# Patient Record
Sex: Male | Born: 1950 | Race: Black or African American | Hispanic: No | Marital: Married | Smoking: Never smoker
Health system: Southern US, Community
[De-identification: ages and names within clinical notes are randomized; demographics above are authoritative.]

## PROBLEM LIST (undated history)

## (undated) DIAGNOSIS — I739 Peripheral vascular disease, unspecified: Secondary | ICD-10-CM

## (undated) DIAGNOSIS — C8400 Mycosis fungoides, unspecified site: Secondary | ICD-10-CM

## (undated) DIAGNOSIS — F329 Major depressive disorder, single episode, unspecified: Secondary | ICD-10-CM

## (undated) DIAGNOSIS — IMO0001 Reserved for inherently not codable concepts without codable children: Secondary | ICD-10-CM

## (undated) DIAGNOSIS — M1990 Unspecified osteoarthritis, unspecified site: Secondary | ICD-10-CM

## (undated) DIAGNOSIS — I70229 Atherosclerosis of native arteries of extremities with rest pain, unspecified extremity: Secondary | ICD-10-CM

## (undated) DIAGNOSIS — R6510 Systemic inflammatory response syndrome (SIRS) of non-infectious origin without acute organ dysfunction: Secondary | ICD-10-CM

## (undated) DIAGNOSIS — N289 Disorder of kidney and ureter, unspecified: Secondary | ICD-10-CM

## (undated) DIAGNOSIS — E785 Hyperlipidemia, unspecified: Secondary | ICD-10-CM

## (undated) DIAGNOSIS — I5042 Chronic combined systolic (congestive) and diastolic (congestive) heart failure: Secondary | ICD-10-CM

## (undated) DIAGNOSIS — I10 Essential (primary) hypertension: Secondary | ICD-10-CM

## (undated) DIAGNOSIS — E119 Type 2 diabetes mellitus without complications: Secondary | ICD-10-CM

## (undated) DIAGNOSIS — F32A Depression, unspecified: Secondary | ICD-10-CM

## (undated) DIAGNOSIS — I2510 Atherosclerotic heart disease of native coronary artery without angina pectoris: Secondary | ICD-10-CM

## (undated) DIAGNOSIS — I428 Other cardiomyopathies: Secondary | ICD-10-CM

## (undated) DIAGNOSIS — G4730 Sleep apnea, unspecified: Secondary | ICD-10-CM

## (undated) DIAGNOSIS — J189 Pneumonia, unspecified organism: Secondary | ICD-10-CM

## (undated) DIAGNOSIS — I998 Other disorder of circulatory system: Secondary | ICD-10-CM

## (undated) HISTORY — DX: Essential (primary) hypertension: I10

## (undated) HISTORY — DX: Depression, unspecified: F32.A

## (undated) HISTORY — DX: Major depressive disorder, single episode, unspecified: F32.9

## (undated) HISTORY — DX: Mycosis fungoides, unspecified site: C84.00

## (undated) HISTORY — DX: Hyperlipidemia, unspecified: E78.5

## (undated) HISTORY — DX: Other disorder of circulatory system: I99.8

## (undated) HISTORY — PX: FRACTURE SURGERY: SHX138

## (undated) HISTORY — PX: COLONOSCOPY: SHX174

## (undated) HISTORY — DX: Atherosclerosis of native arteries of extremities with rest pain, unspecified extremity: I70.229

## (undated) HISTORY — DX: Chronic combined systolic (congestive) and diastolic (congestive) heart failure: I50.42

## (undated) HISTORY — DX: Other cardiomyopathies: I42.8

---

## 1999-08-14 ENCOUNTER — Ambulatory Visit: Admission: RE | Admit: 1999-08-14 | Discharge: 1999-08-14 | Payer: Self-pay | Admitting: Family Medicine

## 2001-12-19 ENCOUNTER — Inpatient Hospital Stay (HOSPITAL_COMMUNITY): Admission: EM | Admit: 2001-12-19 | Discharge: 2001-12-21 | Payer: Self-pay | Admitting: Emergency Medicine

## 2001-12-19 ENCOUNTER — Encounter: Payer: Self-pay | Admitting: Emergency Medicine

## 2003-03-12 ENCOUNTER — Encounter: Admission: RE | Admit: 2003-03-12 | Discharge: 2003-03-12 | Payer: Self-pay | Admitting: Family Medicine

## 2003-03-12 ENCOUNTER — Encounter: Payer: Self-pay | Admitting: Family Medicine

## 2003-05-24 ENCOUNTER — Encounter: Admission: RE | Admit: 2003-05-24 | Discharge: 2003-08-22 | Payer: Self-pay | Admitting: Family Medicine

## 2003-08-20 ENCOUNTER — Observation Stay (HOSPITAL_COMMUNITY): Admission: EM | Admit: 2003-08-20 | Discharge: 2003-08-21 | Payer: Self-pay | Admitting: Emergency Medicine

## 2003-11-19 ENCOUNTER — Encounter: Admission: RE | Admit: 2003-11-19 | Discharge: 2004-01-16 | Payer: Self-pay | Admitting: Neurosurgery

## 2004-01-08 ENCOUNTER — Encounter: Admission: RE | Admit: 2004-01-08 | Discharge: 2004-01-08 | Payer: Self-pay | Admitting: Family Medicine

## 2004-10-05 HISTORY — PX: ORIF FOREARM FRACTURE: SHX2124

## 2005-08-11 ENCOUNTER — Observation Stay (HOSPITAL_COMMUNITY): Admission: EM | Admit: 2005-08-11 | Discharge: 2005-08-13 | Payer: Self-pay | Admitting: Emergency Medicine

## 2010-12-27 ENCOUNTER — Emergency Department (HOSPITAL_COMMUNITY): Payer: Self-pay

## 2010-12-27 ENCOUNTER — Inpatient Hospital Stay (HOSPITAL_COMMUNITY)
Admission: EM | Admit: 2010-12-27 | Discharge: 2010-12-31 | DRG: 194 | Disposition: A | Discharge status: Home / Self Care | Payer: Self-pay | Attending: Internal Medicine | Admitting: Internal Medicine

## 2010-12-27 DIAGNOSIS — J189 Pneumonia, unspecified organism: Principal | ICD-10-CM | POA: Diagnosis present

## 2010-12-27 DIAGNOSIS — IMO0001 Reserved for inherently not codable concepts without codable children: Secondary | ICD-10-CM | POA: Diagnosis present

## 2010-12-27 DIAGNOSIS — R079 Chest pain, unspecified: Secondary | ICD-10-CM | POA: Diagnosis present

## 2010-12-27 DIAGNOSIS — I4729 Other ventricular tachycardia: Secondary | ICD-10-CM | POA: Diagnosis present

## 2010-12-27 DIAGNOSIS — I10 Essential (primary) hypertension: Secondary | ICD-10-CM | POA: Diagnosis present

## 2010-12-27 DIAGNOSIS — R0902 Hypoxemia: Secondary | ICD-10-CM | POA: Diagnosis present

## 2010-12-27 DIAGNOSIS — I472 Ventricular tachycardia, unspecified: Secondary | ICD-10-CM | POA: Diagnosis present

## 2010-12-27 DIAGNOSIS — E785 Hyperlipidemia, unspecified: Secondary | ICD-10-CM | POA: Diagnosis present

## 2010-12-27 LAB — CBC
HCT: 46.2 % (ref 39.0–52.0)
Hemoglobin: 15.5 g/dL (ref 13.0–17.0)
MCH: 28.1 pg (ref 26.0–34.0)
MCHC: 33.5 g/dL (ref 30.0–36.0)
MCV: 83.7 fL (ref 78.0–100.0)
Platelets: 244 10*3/uL (ref 150–400)
RBC: 5.52 MIL/uL (ref 4.22–5.81)
RDW: 12.2 % (ref 11.5–15.5)
WBC: 16.8 10*3/uL — ABNORMAL HIGH (ref 4.0–10.5)

## 2010-12-27 LAB — GLUCOSE, CAPILLARY: Glucose-Capillary: 357 mg/dL — ABNORMAL HIGH (ref 70–99)

## 2010-12-27 LAB — STREP PNEUMONIAE URINARY ANTIGEN: Strep Pneumo Urinary Antigen: NEGATIVE

## 2010-12-27 LAB — DIFFERENTIAL
Basophils Absolute: 0 10*3/uL (ref 0.0–0.1)
Basophils Relative: 0 % (ref 0–1)
Eosinophils Absolute: 0.1 10*3/uL (ref 0.0–0.7)
Eosinophils Relative: 1 % (ref 0–5)
Lymphocytes Relative: 16 % (ref 12–46)
Lymphs Abs: 2.7 10*3/uL (ref 0.7–4.0)
Monocytes Absolute: 1 10*3/uL (ref 0.1–1.0)
Monocytes Relative: 6 % (ref 3–12)
Neutro Abs: 12.9 10*3/uL — ABNORMAL HIGH (ref 1.7–7.7)
Neutrophils Relative %: 77 % (ref 43–77)

## 2010-12-27 LAB — COMPREHENSIVE METABOLIC PANEL
AST: 25 U/L (ref 0–37)
Albumin: 4 g/dL (ref 3.5–5.2)
Alkaline Phosphatase: 85 U/L (ref 39–117)
CO2: 28 mEq/L (ref 19–32)
Calcium: 9.1 mg/dL (ref 8.4–10.5)
Chloride: 102 mEq/L (ref 96–112)
Creatinine, Ser: 0.96 mg/dL (ref 0.4–1.5)
GFR calc Af Amer: >60 mL/min (ref >60)
GFR calc non Af Amer: >60 mL/min (ref >60)
Glucose, Bld: 296 mg/dL — ABNORMAL HIGH (ref 70–99)
Potassium: 4 mEq/L (ref 3.5–5.1)
Sodium: 137 mEq/L (ref 135–145)
Total Bilirubin: 0.9 mg/dL (ref 0.3–1.2)

## 2010-12-27 LAB — POCT CARDIAC MARKERS
Myoglobin, poc: 87.5 ng/mL (ref 12–200)
Troponin i, poc: <0.05 ng/mL (ref 0.00–0.09)

## 2010-12-27 LAB — D-DIMER, QUANTITATIVE: D-Dimer, Quant: <0.22 ug/mL-FEU (ref 0.00–0.48)

## 2010-12-28 ENCOUNTER — Inpatient Hospital Stay (HOSPITAL_COMMUNITY): Payer: Self-pay

## 2010-12-28 DIAGNOSIS — R072 Precordial pain: Secondary | ICD-10-CM

## 2010-12-28 LAB — BASIC METABOLIC PANEL
BUN: 17 mg/dL (ref 6–23)
CO2: 28 mEq/L (ref 19–32)
Calcium: 9.2 mg/dL (ref 8.4–10.5)
Chloride: 101 mEq/L (ref 96–112)
Creatinine, Ser: 0.96 mg/dL (ref 0.4–1.5)
GFR calc Af Amer: >60 mL/min (ref >60)
GFR calc non Af Amer: >60 mL/min (ref >60)
Glucose, Bld: 299 mg/dL — ABNORMAL HIGH (ref 70–99)
Potassium: 4.2 mEq/L (ref 3.5–5.1)

## 2010-12-28 LAB — DIFFERENTIAL
Basophils Absolute: 0 10*3/uL (ref 0.0–0.1)
Eosinophils Absolute: 0 10*3/uL (ref 0.0–0.7)
Eosinophils Relative: 0 % (ref 0–5)
Lymphocytes Relative: 13 % (ref 12–46)
Lymphs Abs: 2 10*3/uL (ref 0.7–4.0)
Monocytes Absolute: 0.9 10*3/uL (ref 0.1–1.0)
Monocytes Relative: 6 % (ref 3–12)
Neutro Abs: 12.2 10*3/uL — ABNORMAL HIGH (ref 1.7–7.7)

## 2010-12-28 LAB — LEGIONELLA ANTIGEN, URINE: Legionella Antigen, Urine: NEGATIVE

## 2010-12-28 LAB — GLUCOSE, CAPILLARY
Glucose-Capillary: 325 mg/dL — ABNORMAL HIGH (ref 70–99)
Glucose-Capillary: 270 mg/dL — ABNORMAL HIGH (ref 70–99)
Glucose-Capillary: 231 mg/dL — ABNORMAL HIGH (ref 70–99)
Glucose-Capillary: 229 mg/dL — ABNORMAL HIGH (ref 70–99)

## 2010-12-28 LAB — HEMOGLOBIN A1C
Hgb A1c MFr Bld: 10.8 % — ABNORMAL HIGH (ref <5.7)
Mean Plasma Glucose: 263 mg/dL — ABNORMAL HIGH (ref <117)

## 2010-12-28 LAB — CBC
HCT: 44.4 % (ref 39.0–52.0)
Hemoglobin: 14.7 g/dL (ref 13.0–17.0)
MCHC: 33.1 g/dL (ref 30.0–36.0)
MCV: 84.1 fL (ref 78.0–100.0)
Platelets: 247 10*3/uL (ref 150–400)
RBC: 5.28 MIL/uL (ref 4.22–5.81)
RDW: 12.4 % (ref 11.5–15.5)

## 2010-12-28 LAB — D-DIMER, QUANTITATIVE: D-Dimer, Quant: 0.23 ug/mL-FEU (ref 0.00–0.48)

## 2010-12-29 DIAGNOSIS — I4729 Other ventricular tachycardia: Secondary | ICD-10-CM

## 2010-12-29 DIAGNOSIS — I472 Ventricular tachycardia, unspecified: Secondary | ICD-10-CM

## 2010-12-29 DIAGNOSIS — R0789 Other chest pain: Secondary | ICD-10-CM

## 2010-12-29 DIAGNOSIS — R9389 Abnormal findings on diagnostic imaging of other specified body structures: Secondary | ICD-10-CM

## 2010-12-29 LAB — GLUCOSE, CAPILLARY
Glucose-Capillary: 254 mg/dL — ABNORMAL HIGH (ref 70–99)
Glucose-Capillary: 255 mg/dL — ABNORMAL HIGH (ref 70–99)

## 2010-12-29 LAB — DIFFERENTIAL
Basophils Relative: 0 % (ref 0–1)
Eosinophils Absolute: 0.2 10*3/uL (ref 0.0–0.7)
Eosinophils Relative: 2 % (ref 0–5)
Lymphs Abs: 3.9 10*3/uL (ref 0.7–4.0)
Monocytes Absolute: 0.8 10*3/uL (ref 0.1–1.0)
Monocytes Relative: 6 % (ref 3–12)
Neutro Abs: 7.5 10*3/uL (ref 1.7–7.7)

## 2010-12-29 LAB — CBC
Platelets: 220 10*3/uL (ref 150–400)
RBC: 5.16 MIL/uL (ref 4.22–5.81)
RDW: 12.5 % (ref 11.5–15.5)
WBC: 12.4 10*3/uL — ABNORMAL HIGH (ref 4.0–10.5)

## 2010-12-29 LAB — CARDIAC PANEL(CRET KIN+CKTOT+MB+TROPI)
CK, MB: 1.6 ng/mL (ref 0.3–4.0)
Relative Index: INVALID (ref 0.0–2.5)
Relative Index: INVALID (ref 0.0–2.5)
Total CK: 56 U/L (ref 7–232)
Troponin I: 0.01 ng/mL (ref 0.00–0.06)

## 2010-12-29 LAB — BASIC METABOLIC PANEL
Chloride: 102 mEq/L (ref 96–112)
GFR calc Af Amer: >60 mL/min (ref >60)
GFR calc non Af Amer: >60 mL/min (ref >60)
Potassium: 4 mEq/L (ref 3.5–5.1)

## 2010-12-30 DIAGNOSIS — R079 Chest pain, unspecified: Secondary | ICD-10-CM

## 2010-12-30 LAB — CARDIAC PANEL(CRET KIN+CKTOT+MB+TROPI)
CK, MB: 1.1 ng/mL (ref 0.3–4.0)
Relative Index: INVALID (ref 0.0–2.5)
Troponin I: 0.01 ng/mL (ref 0.00–0.06)

## 2010-12-30 LAB — GLUCOSE, CAPILLARY
Glucose-Capillary: 230 mg/dL — ABNORMAL HIGH (ref 70–99)
Glucose-Capillary: 110 mg/dL — ABNORMAL HIGH (ref 70–99)
Glucose-Capillary: 197 mg/dL — ABNORMAL HIGH (ref 70–99)

## 2010-12-31 LAB — LIPID PANEL
Cholesterol: 206 mg/dL — ABNORMAL HIGH (ref 0–200)
HDL: 24 mg/dL — ABNORMAL LOW (ref >39)
LDL Cholesterol: 112 mg/dL — ABNORMAL HIGH (ref 0–99)
Triglycerides: 350 mg/dL — ABNORMAL HIGH (ref <150)

## 2010-12-31 LAB — CBC
HCT: 47 % (ref 39.0–52.0)
Hemoglobin: 15.7 g/dL (ref 13.0–17.0)
MCV: 84.4 fL (ref 78.0–100.0)
RBC: 5.57 MIL/uL (ref 4.22–5.81)
RDW: 12.1 % (ref 11.5–15.5)
WBC: 8.5 10*3/uL (ref 4.0–10.5)

## 2010-12-31 LAB — DIFFERENTIAL
Basophils Absolute: 0.1 10*3/uL (ref 0.0–0.1)
Eosinophils Relative: 2 % (ref 0–5)
Lymphocytes Relative: 36 % (ref 12–46)
Lymphs Abs: 3.1 10*3/uL (ref 0.7–4.0)
Neutro Abs: 4.7 10*3/uL (ref 1.7–7.7)

## 2010-12-31 LAB — GLUCOSE, CAPILLARY: Glucose-Capillary: 207 mg/dL — ABNORMAL HIGH (ref 70–99)

## 2011-01-01 LAB — GLUCOSE, CAPILLARY: Glucose-Capillary: 386 mg/dL — ABNORMAL HIGH (ref 70–99)

## 2011-01-01 NOTE — Discharge Summary (Signed)
Eric Price, Eric Price                 ACCOUNT NO.:  [id number]  MEDICAL RECORD NO.:  [id number]           PATIENT TYPE:  I  LOCATION:  1418                         FACILITY:  [facility]  PHYSICIAN:  Peggye Pitt, M.D. DATE OF BIRTH:  01/12/1951  DATE OF ADMISSION:  12/27/2010 DATE OF DISCHARGE:  12/31/2010                              DISCHARGE SUMMARY   PRIMARY CARE PHYSICIAN:  Della Goo, MD  CARDIOLOGIST:  Rollene Rotunda, MD, [facility]  DISCHARGE DIAGNOSES: 1. Community-acquired pneumonia. 2. Chest pain, for outpatient stress test. 3. Uncontrolled diabetes mellitus. 4. Nonsustained ventricular tachycardia. 5. Hypoxemia, resolved secondary to pneumonia. 6. Hypertension. 7. Hyperlipidemia.  DISCHARGE MEDICATIONS: 1. Avelox 400 mg daily for 7 days. 2. Lantus insulin 20 units injected subcutaneously daily. 3. Simvastatin 40 mg daily. 4. Aleve 220 mg every 8 hours as needed for pain. 5. Aspirin 81 mg daily. 6. Cozaar 50 mg at bedtime. 7. Cranberry over the counter 1 tablet daily. 8. Fish oil 1000 mg daily. 9. Glyburide 5 mg daily. 10.Metformin 500 mg to take 2 tablets twice daily. 11.Triamcinolone 0.1% cream at bedtime daily for rash.  DISPOSITION AND FOLLOWUP:  Eric Price will be discharged home today in stable and improved condition.  He has been asked to schedule followup with Dr. Lovell Sheehan in about 2 weeks.  He will need to bring his CBG log, so Dr. Lovell Sheehan can further adjust his insulin as required.  The [facility] Cardiology office will call him for the appointment for an outpatient stress test.  CONSULTATION THIS HOSPITALIZATION:  Rollene Rotunda, MD, [facility] with Cardiology.  IMAGES AND PROCEDURES:  A chest x-ray on March 24th that showed right lower lobe airspace disease, suspicious for pneumonia, and secondarily focus of pneumonia maybe present in the right upper lung.  Repeat chest x-ray on March 25th that showed less prominent right lower lobe opacity.  HISTORY AND  PHYSICAL:  For complete details, please see dictation by Dr. Waymon Amato on March 24th, but in brief, Eric Price is a pleasant 60 year old African American gentleman, initially admitted to our hospital on March 24th with complaints of cough, chest pain, and shortness of breath.  He was found to be hypoxemic at 85% on room air with a pneumonia on chest x-ray, because of this we were asked to admit him for further evaluation.  HOSPITAL COURSE BY PROBLEM: 1. Community-acquired pneumonia.  He was placed on Rocephin and     azithromycin upon admission.  We have been able to titrate his     oxygen off.  He is feeling much better at this point and is no     longer having any shortness of breath or chest pain.  He will need     to take Avelox for 7 days to complete a 10-day course of     antibiotics.  He is recommended to follow up with Dr. Lovell Sheehan and I     would recommend a followup chest x-ray in 4-6 weeks to ensure     complete resolution of his pneumonia. 2. Chest pain.  He was seen in consultation by Dr. Antoine Poche with  [facility].  He has also had some nonsustained ventricular     tachycardia on telemetry.  He had a cardiac catheterization in 2003     that showed nonobstructive coronary disease.  This could have     certainly progressed since then.  Dr. Antoine Poche would like to see     him in the outpatient setting for a stress test plus/minus a Holter     monitor.  His office will call him with this appointment time. 3. Hyperlipidemia.  We have his fasting lipid panel while in the     hospital, is as follows total cholesterol 206, triglycerides 350,     HDL 24, and LDL of 112.  Because of this, his home dose of Zocor     has been increased from 20-40 mg a day.  He will need to repeat     LFTs and lipid profile in approximately 8 weeks. 4. Uncontrolled diabetes mellitus.  We have started him on Lantus, we     have had to titrate this up to 20 units.  He will continue his      glyburide and metformin.  I have asked him to take his CBGs at     least twice daily and bring these to Dr. Lovell Sheehan at his followup     appointment for further insulin titration as deemed necessary.  We     have also ordered some outpatient diabetes education. 5. Rest of chronic conditions have been stable. 6. Vitals on day of discharge, blood pressure 137/90, heart rate 96,     respirations 20, saturations are 92% on room air, and temperature     of 97.6.     Peggye Pitt, M.D.     EH/MEDQ  D:  12/31/2010  T:  12/31/2010  Job:  [id number]  cc:   Della Goo, M.D. Fax: [phone]  Rollene Rotunda, MD, [facility] [address]  [address] [place] [address]  Electronically Signed by Peggye Pitt M.D. on 01/01/2011 07:57:40 AM

## 2011-01-05 NOTE — H&P (Signed)
NAMEDIONEL, ARCHEY                 ACCOUNT NO.:  [id number]  MEDICAL RECORD NO.:  [id number]           PATIENT TYPE:  I  LOCATION:  1418                         FACILITY:  [facility]  PHYSICIAN:  Marcellus Scott, MD     DATE OF BIRTH:  20-Mar-1951  DATE OF ADMISSION:  12/27/2010 DATE OF DISCHARGE:                             HISTORY & PHYSICAL   PRIMARY CARE PHYSICIAN:  Della Goo, M.D.  CHIEF COMPLAINTS:  Nonproductive cough, chest pain, dyspnea.  HISTORY OF PRESENT ILLNESS:  Mr. Brutus is a very pleasant 60 year old African-American male patient with history of type 2 diabetes mellitus, hypertension, hyperlipidemia who was in his usual state of health until approximately 6:00 p.m. yesterday.  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  It started to rain and he took the upholstery indoors and continued cleaning.  Subsequently he started having a nonproductive cough, dyspnea, wheezing and right-sided chest pain only on coughing.  He complained of chills, but denied fevers.  These symptoms seemed to progressively get worse overnight.  He thereby presented to the emergency room where he had a low-grade temperature, leukocytosis and a chest x-ray suggestive of right lower lobe pneumonia.  He was also hypoxemic with oxygen saturation of 85% on room air and tachycardic with a heart rate of 116.  The [facility] were requested to admit him for further evaluation and management.  The patient denies any sickly contacts or long-distance travel.  He is a lifelong nonsmoker.  PAST MEDICAL HISTORY: 1. Type 2 diabetes mellitus. 2. Hypertension. 3. Hyperlipidemia. 4. Possible mild sleep apnea, but not on CPAP.  PAST SURGICAL HISTORY: 1. Open reduction and internal fixation of right forearm fracture in     2006. 2. Cardiac catheterization on December 21, 2001, which shows left     anterior descending 30% to 40% narrowing in the  midportion.     Otherwise normal left ventricular function.  ALLERGIES: 1. VIOXX 2. CODEINE.  HOME MEDICATIONS: 1. Triamcinolone 0.1% cream, one application q.h.s. 2. Cozaar 50 mg p.o. q.h.s. 3. Fish oil 1000 mg p.o. daily. 4. Aleve 220 mg tablet, three tablets p.o. q.8 hours p.r.n. for pain. 5. Cranberry over-the-counter one tablet p.o. daily. 6. Enteric-coated aspirin 81 mg p.o. daily. 7. Glyburide 5 mg p.o. daily. 8. Pravastatin 40 mg p.o. q.h.s. 9. Metformin 1000 mg p.o. b.i.d.  FAMILY HISTORY: 1. The patient's father died at age 73 from a myocardial infarction. 2. The patient's mother died at age 67 years from heart complications.     She also had diabetes.  SOCIAL HISTORY:  The patient is married and his spouse is at the bedside.  He does Building surveyor work.  He is independent of activities of daily living.  There is no history of smoking, alcohol or drug abuse.  ADVANCED DIRECTIVES:  Full Code.  REVIEW OF SYSTEMS:  All systems reviewed and apart from the history of presenting illness are negative.  The patient has an intermittent rash on the left upper back for which he uses skin cream and currently it has resolved.  PHYSICAL EXAMINATION:  GENERAL:  Mr. Schoenberger is a moderately built and nourished male patient who is in no obvious distress. VITAL SIGNS:  Temperature 98.7 degrees Fahrenheit, blood pressure 138/92 mmHg, pulse rate is 113 beats per minute and regular, respirations 20 per minute and saturating at 93% on oxygen. HEAD/EYES/ENT:  Nontraumatic, normocephalic.  Pupils equally reactive to light and accommodation.  Oral mucosa moist with no acute finding. NECK:  Supple.  No JVD or carotid bruit. LYMPHATICS:  No lymphadenopathy. RESPIRATORY SYSTEM:  With few crackles in the right base and occasional crackles in the left base.  Otherwise clear to auscultation with no increased work of breathing or rubs. CARDIOVASCULAR SYSTEM:  First and second heart sounds heard.   Regular. No murmurs or JVD. ABDOMEN:  Nondistended, nontender, soft and normal bowel sounds present. No organomegaly or mass appreciated. CENTRAL NERVOUS SYSTEM:  The patient is awake, alert and oriented x3 with no focal neurological deficits. EXTREMITIES:  With grade 5/5 power. SKIN:  Without any rashes at this time. MUSCULOSKELETAL SYSTEM:  Negative. PSYCHIATRY:  The patient is pleasant and appropriate.  LABORATORY DATA:  D-dimer negative at less than 0.22.  Comprehensive metabolic panel only significant for a glucose of 296.  Point of care cardiac markers are negative.  CBC is significant for a white blood cell of 17,000 with 77% neutrophils.  RADIOLOGIC: 1. Chest x-ray.  Impression:  Right lower lobe airspace disease     suspicious for pneumonia.  A second early focus of pneumonia may be     present in the upper right lung.  Radiographic followup to clearing     was recommended. 2. EKG shows sinus tachycardia at 108 beats per minute with left axis     deviation.  Q-waves in inferior leads.  No other acute findings.  ASSESSMENT AND PLAN: 1. Right lower lobe pneumonia.  Community-acquired pneumonia versuschemical pneumonitis.  We will admit the patient to Telemetry.     Obtain blood cultures if temperature greater than 101, urine     Legionella and streptococcal antigen.  The patient has received a     dose of Rocephin and Zithromax and Solu-Medrol in the emergency     room.  We will continue the Rocephin and the Zithromax.  We will     repeat a chest x-ray in the a.m. to evaluate the area on the right     upper lobe which is suspicious for developing pneumonia. 2. Hypoxemia secondary to right lower lobe pneumonia.  The patient has     received a dose of Solu-Medrol and currently does not seem to have     any bronchospasm.  Will provide oxygen, bronchodilator,     nebulizations and monitor. 3. Uncontrolled type 2 diabetes mellitus.  We will continue home     medications  including glyburide and metformin and place on sliding     scale insulin.  We will check hemoglobin A1c. 4. Hypertension.  Continue home medications. 5. Full code status.  Time taken in coordinating this history and physical note was 1 hour.     Marcellus Scott, MD     AH/MEDQ  D:  12/27/2010  T:  12/27/2010  Job:  [id number]  cc:   Della Goo, M.D. Fax: [phone]  Electronically Signed by Marcellus Scott MD on 01/05/2011 12:03:40 AM

## 2011-01-30 NOTE — Consult Note (Signed)
NAMEJOSS, MCDILL                 ACCOUNT NO.:  [id number]  MEDICAL RECORD NO.:  [id number]           PATIENT TYPE:  I  LOCATION:  1418                         [facility]ACILITY[facility]  [facility]  PHYSICIAN:  Rollene Rotunda, MD, FACCDATE OF BIRTH:  Feb 27, 1951  DATE OF CONSULTATION:  12/29/2010 DATE OF DISCHARGE:                                CONSULTATION   PRIMARY CARE PHYSICIAN:  Della Goo, M.D.  CARDIOLOGIST:  None.  REASON FOR CONSULTATION:  The patient with abnormal echocardiogram, chest discomfort, and nonsustained ventricular tachycardia.  HISTORY OF PRESENT ILLNESS:  The patient is a very pleasant 60 year old African American gentleman with a past history of nonobstructive coronary disease with catheterization several years ago as described below.  He does have cardiovascular risk factors.  He actually came into the hospital with some right-sided chest discomfort and abdominal discomfort and dyspnea.  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  He inhaled fumes.  He has actually been found to have a pneumonia and is being managed for this.  However, because of his risk factors and chest discomfort, he did have an echocardiogram. This demonstrated well-preserved ejection fraction.  However, there is a calcified apical finding of possible muscle bridge.  He also has been noted to have some wide-complex arrhythmias.  There has been a 3-beat run of nonsustained ventricular tachycardia as well as wide-complex possible idioventricular rhythm.  He may or may not have felt these palpitations.  He has had some vague palpitations in the past, but no presyncope or syncope.  He describes chest discomfort as difficulty breathing on his right side. This again all happened acutely.  He did not have neck or left arm discomfort.  He did not describe substernal or left-sided discomfort. There was no reported associated nausea, vomiting, or diaphoresis.  EKG did not  demonstrate acute ST-segment changes, though there is possibly an old inferior infarct with inferior Q waves.  Echocardiogram did not show regional wall motion abnormality consistent with this.  D-dimer was negative.  CK-MB, point-of-care markers negative.  The patient reports that he is relatively active in his job.  He does some walking occasionally for exercise.  He has not been able to bring on any symptoms with this.  Denies any shortness of breath, PND, or orthopnea.  PAST MEDICAL HISTORY: 1. Non-insulin-dependent diabetes mellitus, poorly controlled. 2. Hypertension. 3. Hyperlipidemia. 4. Nonobstructive coronary disease (catheterization in 2003, LAD 30%     to 40% stenosis in the midsegment).  PAST SURGICAL HISTORY:  Right upper extremity fracture.  ALLERGIES: 1. VIOXX. 2. CODEINE.  MEDICATIONS (PRIOR TO ADMISSION): 1. Triamcinolone. 2. Cozaar 50 mg nightly. 3. Fish oil. 4. Aleve. 5. Cranberry over the counter. 6. Enteric-coated aspirin. 7. Glyburide 5 mg daily. 8. Pravastatin 40 mg nightly. 9. Metformin 1000 mg b.i.d. SOCIAL HISTORY:  The patient is married.  His wife actually was my patient.  He has never smoked cigarettes.  He works Building surveyor.  FAMILY HISTORY:  Contributory for his father having myocardial infarction in his 4s and dying at age 65 of an MRI.  REVIEW OF SYSTEMS:  As  stated in the HPI and negative for all other systems.  PHYSICAL EXAMINATION:  GENERAL:  The patient is pleasant and in no distress. VITAL SIGNS:  Blood pressure 122/80, afebrile, heart rate 83 and regular, 98% saturation on 2 L. HEENT:  Eyes unremarkable.  Pupils equal, round, and reactive to light. Fundi not visualized.  Oral mucosa normal. NECK:  No jugular venous distention at 45 degrees.  Carotid upstroke brisk and symmetrical.  No bruits, thyromegaly.  LYMPHATICS:  No cervical, axillary, or inguinal adenopathy. LUNGS:  Clear to auscultation bilaterally. BACK:  No  costovertebral tenderness. CHEST:  Unremarkable. HEART:  PMI not displaced or sustained.  S1, S2 within normal.  No S3. No S4.  No clicks.  No rubs.  No murmurs.  ABDOMEN:  Morbidly obese. Positive bowel sounds.  Normal frequency and pitch.  No bruits.  No rebound.  No guarding.  No midline pulsatile mass, hepatomegaly, splenomegaly. SKIN:  No rashes.  No nodules. EXTREMITIES:  2+ pulses throughout.  No edema, cyanosis or clubbing. NEURO:  Oriented to person, place, and time.  Cranial nerves II through XII grossly intact.  Motor grossly intact.  IMAGING STUDIES:  EKG, sinus tachycardia, rate 24, left axis deviation, intervals with borderline QTc, nondiagnostic inferior Q waves.  Chest x-ray, right lower lobe pneumonia.  LABORATORY DATA:  WBC 12.4, hemoglobin 14.2, platelets 220.  Sodium 135, potassium 4.0, BUN 13, creatinine 0.67, magnesium 2.5.  D-dimer 0.23.  ASSESSMENT/PLAN: 1. Chest:  The patient's chest comfort is atypical and probably     related to his pneumonia.  However, he has significant     cardiovascular risk factors.  Outpatient stress perfusion study     should be undertaken as soon as he is improved from the standpoint  of his pneumonia.  The possibility of obstructive coronary disease     is at least moderately high indicating the need for perfusion     imaging. 2. Nonsustained ventricular tachycardia. This will be evaluated as     above, ruling out ischemia.  I will consider outpatient event     monitoring, pending the results of stress test. 3. Diabetes.  I had a long discussion about this.  He needs aggressive     medical management and lifestyle changes for this.  He will follow     with his primary provider. 4. Dyslipidemia.  I would like to see a fasting lipid profile and I     would take an aggressive stance given his previous nonobstructive     coronary disease.  I would suggest an LDL less than some 100 and     HDL greater than 40 as goals.  This to be  adjusted further if he is     found to have obstructive coronary disease. 5. Obesity.  Again, we discussed need to lose weight with diet and     exercise. 6. Calcified apical echo finding.  This is probably incidental and I     will follow up with an outpatient echocardiogram in the future to     demonstrate stability.     Rollene Rotunda, MD, [facility]     JH/MEDQ  D:  12/29/2010  T:  12/30/2010  Job:  [id number]  cc:   Della Goo, M.D. Fax: [phone]  Electronically Signed by Rollene Rotunda MD [facility] on 01/30/2011 11:44:03 AM

## 2011-10-06 DIAGNOSIS — J189 Pneumonia, unspecified organism: Secondary | ICD-10-CM

## 2011-10-06 HISTORY — DX: Pneumonia, unspecified organism: J18.9

## 2011-10-06 HISTORY — PX: KNEE SURGERY: SHX244

## 2012-07-13 ENCOUNTER — Ambulatory Visit (INDEPENDENT_AMBULATORY_CARE_PROVIDER_SITE_OTHER): Payer: BC Managed Care – PPO | Admitting: Family Medicine

## 2012-07-13 ENCOUNTER — Encounter: Payer: Self-pay | Admitting: Family Medicine

## 2012-07-13 VITALS — BP 130/88 | HR 72 | Temp 98.2°F | Resp 12 | Ht 68.75 in | Wt 223.0 lb

## 2012-07-13 DIAGNOSIS — L309 Dermatitis, unspecified: Secondary | ICD-10-CM

## 2012-07-13 DIAGNOSIS — I10 Essential (primary) hypertension: Secondary | ICD-10-CM | POA: Insufficient documentation

## 2012-07-13 DIAGNOSIS — E119 Type 2 diabetes mellitus without complications: Secondary | ICD-10-CM

## 2012-07-13 DIAGNOSIS — Z23 Encounter for immunization: Secondary | ICD-10-CM

## 2012-07-13 DIAGNOSIS — Z8659 Personal history of other mental and behavioral disorders: Secondary | ICD-10-CM

## 2012-07-13 DIAGNOSIS — E785 Hyperlipidemia, unspecified: Secondary | ICD-10-CM | POA: Insufficient documentation

## 2012-07-13 DIAGNOSIS — L259 Unspecified contact dermatitis, unspecified cause: Secondary | ICD-10-CM

## 2012-07-13 DIAGNOSIS — E1142 Type 2 diabetes mellitus with diabetic polyneuropathy: Secondary | ICD-10-CM | POA: Insufficient documentation

## 2012-07-13 LAB — LIPID PANEL
HDL: 27.8 mg/dL — ABNORMAL LOW (ref >39.00)
Triglycerides: 140 mg/dL (ref 0.0–149.0)
VLDL: 28 mg/dL (ref 0.0–40.0)

## 2012-07-13 LAB — HEPATIC FUNCTION PANEL
ALT: 21 U/L (ref 0–53)
Total Bilirubin: 0.6 mg/dL (ref 0.3–1.2)
Total Protein: 7.3 g/dL (ref 6.0–8.3)

## 2012-07-13 LAB — HEMOGLOBIN A1C: Hgb A1c MFr Bld: 7.3 % — ABNORMAL HIGH (ref 4.6–6.5)

## 2012-07-13 LAB — BASIC METABOLIC PANEL
CO2: 28 mEq/L (ref 19–32)
Calcium: 9.4 mg/dL (ref 8.4–10.5)
Glucose, Bld: 125 mg/dL — ABNORMAL HIGH (ref 70–99)
Potassium: 4.6 mEq/L (ref 3.5–5.1)
Sodium: 140 mEq/L (ref 135–145)

## 2012-07-13 LAB — MICROALBUMIN / CREATININE URINE RATIO: Microalb Creat Ratio: 0.2 mg/g (ref 0.0–30.0)

## 2012-07-13 MED ORDER — TRIAMCINOLONE ACETONIDE 0.1 % EX CREA: TOPICAL_CREAM | Freq: Two times a day (BID) | CUTANEOUS | Status: DC

## 2012-07-13 NOTE — Progress Notes (Signed)
  Subjective:    Patient ID: Eric Price, male    DOB: 1951-08-20, 61 y.o.   MRN: [id number]  HPI  New to establish care. Patient has history of type 2 diabetes diagnosed about 2003, remote history of depression, hypertension, hyperlipidemia. No prior surgeries. Medications reviewed. He states he has lost about 40 pounds this year due to his efforts with increased exercise and dietary change. Feels much better overall. Not monitoring blood sugars regularly but when checked usually low 100 range. No symptoms of hyperglycemia. No recent hypoglycemia. No recent eye exam. No flu vaccine yet. He relates previous Pneumovax last year.  He had some problems with dry skin and recurrent pruritic rash mostly trunk region. Has tried triamcinolone with slight improvement. At one point used topical antifungal without improvement.  Family history significant for father with heart disease and hypertension. Grandparents with type 2 diabetes.  Patient is married. Nonsmoker. No alcohol use. Works in Optometrist.   Review of Systems  Constitutional: Negative for appetite change, fatigue and unexpected weight change.  Eyes: Negative for visual disturbance.  Respiratory: Negative for cough, chest tightness and shortness of breath.   Cardiovascular: Negative for chest pain, palpitations and leg swelling.  Gastrointestinal: Negative for abdominal pain.  Genitourinary: Negative for dysuria.  Skin: Positive for rash.  Neurological: Negative for dizziness, syncope, weakness, light-headedness and headaches.  Psychiatric/Behavioral: Negative for dysphoric mood.       Objective:   Physical Exam  Constitutional: He appears well-developed and well-nourished.  HENT:  Right Ear: External ear normal.  Left Ear: External ear normal.  Mouth/Throat: Oropharynx is clear and moist.  Neck: Neck supple. No thyromegaly present.  Cardiovascular: Normal rate and regular rhythm.   Pulmonary/Chest: Effort normal and  breath sounds normal. No respiratory distress. He has no wheezes. He has no rales.  Musculoskeletal: He exhibits no edema.  Lymphadenopathy:    He has no cervical adenopathy.  Skin:       Patient has generally dry skin. He has some nonspecific dry scaly somewhat excoriated patches on his lower back and scattered around the trunk region.          Assessment & Plan:  #1 type 2 diabetes. Recheck A1c. Encouraged to set up eye exam. Check urine microalbumin screening #2 hypertension. Stable by today's reading. Continue weight loss efforts  #3 hyperlipidemia. Check lipid and hepatic panel #4 history of eczema. Compound triamcinolone 0.1% cream 50-50 with Eucerin cream and apply twice daily.  #5 health maintenance. Flu vaccine given. Schedule complete physical

## 2012-07-13 NOTE — Patient Instructions (Addendum)
Set up eye exam this year Consider complete physical at some point this year.

## 2012-07-15 ENCOUNTER — Other Ambulatory Visit: Payer: Self-pay | Admitting: *Deleted

## 2012-07-15 DIAGNOSIS — E785 Hyperlipidemia, unspecified: Secondary | ICD-10-CM

## 2012-07-15 MED ORDER — EZETIMIBE 10 MG PO TABS: 10.0000 mg | ORAL_TABLET | Freq: Every day | ORAL | Status: DC

## 2012-07-15 NOTE — Progress Notes (Signed)
Quick Note:  Pt informed, he will price the Zetia and let us know if too expensive. Med sent , future lab ordered ______

## 2012-08-16 ENCOUNTER — Emergency Department (HOSPITAL_COMMUNITY)
Admission: EM | Admit: 2012-08-16 | Discharge: 2012-08-16 | Disposition: A | Discharge status: Home / Self Care | Payer: BC Managed Care – PPO | Attending: Emergency Medicine | Admitting: Emergency Medicine

## 2012-08-16 ENCOUNTER — Emergency Department (HOSPITAL_COMMUNITY): Payer: BC Managed Care – PPO

## 2012-08-16 ENCOUNTER — Encounter (HOSPITAL_COMMUNITY): Payer: Self-pay | Admitting: Emergency Medicine

## 2012-08-16 DIAGNOSIS — M549 Dorsalgia, unspecified: Secondary | ICD-10-CM

## 2012-08-16 DIAGNOSIS — M791 Myalgia, unspecified site: Secondary | ICD-10-CM

## 2012-08-16 DIAGNOSIS — Z79899 Other long term (current) drug therapy: Secondary | ICD-10-CM | POA: Insufficient documentation

## 2012-08-16 DIAGNOSIS — IMO0002 Reserved for concepts with insufficient information to code with codable children: Secondary | ICD-10-CM | POA: Insufficient documentation

## 2012-08-16 DIAGNOSIS — Y929 Unspecified place or not applicable: Secondary | ICD-10-CM | POA: Insufficient documentation

## 2012-08-16 DIAGNOSIS — I10 Essential (primary) hypertension: Secondary | ICD-10-CM | POA: Insufficient documentation

## 2012-08-16 DIAGNOSIS — IMO0001 Reserved for inherently not codable concepts without codable children: Secondary | ICD-10-CM | POA: Insufficient documentation

## 2012-08-16 DIAGNOSIS — E119 Type 2 diabetes mellitus without complications: Secondary | ICD-10-CM | POA: Insufficient documentation

## 2012-08-16 DIAGNOSIS — F3289 Other specified depressive episodes: Secondary | ICD-10-CM | POA: Insufficient documentation

## 2012-08-16 DIAGNOSIS — E785 Hyperlipidemia, unspecified: Secondary | ICD-10-CM | POA: Insufficient documentation

## 2012-08-16 DIAGNOSIS — F329 Major depressive disorder, single episode, unspecified: Secondary | ICD-10-CM | POA: Insufficient documentation

## 2012-08-16 DIAGNOSIS — Y939 Activity, unspecified: Secondary | ICD-10-CM | POA: Insufficient documentation

## 2012-08-16 MED ORDER — IBUPROFEN 800 MG PO TABS
800.0000 mg | ORAL_TABLET | Freq: Once | ORAL | Status: AC
  Administered 2012-08-16: 800 mg via ORAL
  Filled 2012-08-16: qty 1

## 2012-08-16 MED ORDER — DIAZEPAM 5 MG PO TABS
5.0000 mg | ORAL_TABLET | Freq: Once | ORAL | Status: AC
  Administered 2012-08-16: 5 mg via ORAL
  Filled 2012-08-16: qty 1

## 2012-08-16 MED ORDER — NAPROXEN 375 MG PO TABS: 375.0000 mg | ORAL_TABLET | Freq: Two times a day (BID) | ORAL | Status: DC

## 2012-08-16 MED ORDER — HYDROCODONE-ACETAMINOPHEN 5-325 MG PO TABS: 1.0000 | ORAL_TABLET | Freq: Four times a day (QID) | ORAL | Status: DC | PRN
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
## 2012-08-17 ENCOUNTER — Ambulatory Visit (INDEPENDENT_AMBULATORY_CARE_PROVIDER_SITE_OTHER): Payer: BC Managed Care – PPO | Admitting: Family Medicine

## 2012-08-17 ENCOUNTER — Encounter: Payer: Self-pay | Admitting: Family Medicine

## 2012-08-17 VITALS — BP 140/88 | Temp 98.3°F | Wt 222.0 lb

## 2012-08-17 DIAGNOSIS — M79642 Pain in left hand: Secondary | ICD-10-CM

## 2012-08-17 DIAGNOSIS — M79609 Pain in unspecified limb: Secondary | ICD-10-CM

## 2012-08-17 DIAGNOSIS — M549 Dorsalgia, unspecified: Secondary | ICD-10-CM

## 2012-08-17 DIAGNOSIS — M546 Pain in thoracic spine: Secondary | ICD-10-CM

## 2012-08-17 MED ORDER — CYCLOBENZAPRINE HCL 10 MG PO TABS: 10.0000 mg | ORAL_TABLET | Freq: Three times a day (TID) | ORAL | Status: DC | PRN

## 2012-08-17 NOTE — ED Provider Notes (Signed)
Medical screening examination/treatment/procedure(s) were performed by non-physician practitioner and as supervising physician I was immediately available for consultation/collaboration.  Peyten Punches L Caoimhe Damron, MD 08/17/12 0048 
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
## 2012-08-17 NOTE — Patient Instructions (Addendum)
Follow up promptly for any  Increased headache, vomiting, confusion. Moist heat for symptom relief.

## 2012-08-30 ENCOUNTER — Telehealth: Payer: Self-pay | Admitting: Family Medicine

## 2012-08-30 DIAGNOSIS — E785 Hyperlipidemia, unspecified: Secondary | ICD-10-CM

## 2012-08-30 DIAGNOSIS — I10 Essential (primary) hypertension: Secondary | ICD-10-CM

## 2012-08-30 DIAGNOSIS — E119 Type 2 diabetes mellitus without complications: Secondary | ICD-10-CM

## 2012-08-30 MED ORDER — NAPROXEN 375 MG PO TABS: 375.0000 mg | ORAL_TABLET | Freq: Two times a day (BID) | ORAL | Status: DC

## 2012-08-30 MED ORDER — PRAVASTATIN SODIUM 40 MG PO TABS: 40.0000 mg | ORAL_TABLET | Freq: Every day | ORAL | Status: DC

## 2012-08-30 MED ORDER — LOSARTAN POTASSIUM 50 MG PO TABS: 50.0000 mg | ORAL_TABLET | Freq: Every day | ORAL | Status: DC

## 2012-08-30 MED ORDER — METFORMIN HCL 500 MG PO TABS: 1000.0000 mg | ORAL_TABLET | Freq: Two times a day (BID) | ORAL | Status: DC

## 2012-08-30 MED ORDER — GLYBURIDE 5 MG PO TABS: 5.0000 mg | ORAL_TABLET | Freq: Two times a day (BID) | ORAL | Status: DC

## 2012-08-30 NOTE — Telephone Encounter (Signed)
Pt called and is req refills for the following meds: metFORMIN (GLUCOPHAGE) 500 MG tablet,  glyBURIDE (DIABETA) 5 MG tablet,  losartan (COZAAR) 50 MG tablet , pravastatin (PRAVACHOL) 40 MG tablet,  naproxen (NAPROSYN) 375 MG tablet,  HYDROcodone-acetaminophen (NORCO/VICODIN) 5-325 MG per tablet to [facility] at [employer].

## 2012-08-30 NOTE — Telephone Encounter (Signed)
Hydrocodone last filled on 08-16-12, #15 with 0 refills.  All other meds I filled for 6 months

## 2012-08-30 NOTE — Telephone Encounter (Signed)
Refill once 

## 2012-08-31 MED ORDER — HYDROCODONE-ACETAMINOPHEN 5-325 MG PO TABS: 1.0000 | ORAL_TABLET | Freq: Four times a day (QID) | ORAL | Status: DC | PRN

## 2012-10-14 ENCOUNTER — Ambulatory Visit (INDEPENDENT_AMBULATORY_CARE_PROVIDER_SITE_OTHER): Payer: BC Managed Care – PPO | Admitting: Family Medicine

## 2012-10-14 ENCOUNTER — Encounter: Payer: Self-pay | Admitting: Family Medicine

## 2012-10-14 VITALS — BP 142/92 | Temp 98.2°F | Wt 226.0 lb

## 2012-10-14 DIAGNOSIS — E785 Hyperlipidemia, unspecified: Secondary | ICD-10-CM

## 2012-10-14 DIAGNOSIS — M545 Low back pain, unspecified: Secondary | ICD-10-CM

## 2012-10-14 DIAGNOSIS — L309 Dermatitis, unspecified: Secondary | ICD-10-CM

## 2012-10-14 DIAGNOSIS — L910 Hypertrophic scar: Secondary | ICD-10-CM

## 2012-10-14 DIAGNOSIS — I10 Essential (primary) hypertension: Secondary | ICD-10-CM

## 2012-10-14 DIAGNOSIS — L259 Unspecified contact dermatitis, unspecified cause: Secondary | ICD-10-CM

## 2012-10-14 DIAGNOSIS — E119 Type 2 diabetes mellitus without complications: Secondary | ICD-10-CM

## 2012-10-14 LAB — LIPID PANEL
HDL: 36 mg/dL — ABNORMAL LOW (ref >39.00)
Total CHOL/HDL Ratio: 6
VLDL: 19.2 mg/dL (ref 0.0–40.0)

## 2012-10-14 LAB — HEMOGLOBIN A1C: Hgb A1c MFr Bld: 8 % — ABNORMAL HIGH (ref 4.6–6.5)

## 2012-10-14 LAB — LDL CHOLESTEROL, DIRECT: Direct LDL: 145.9 mg/dL

## 2012-10-14 MED ORDER — CYCLOBENZAPRINE HCL 10 MG PO TABS: 10.0000 mg | ORAL_TABLET | Freq: Three times a day (TID) | ORAL | Status: DC | PRN

## 2012-10-14 MED ORDER — EZETIMIBE 10 MG PO TABS: 10.0000 mg | ORAL_TABLET | Freq: Every day | ORAL | Status: DC

## 2012-10-14 MED ORDER — NYSTATIN-TRIAMCINOLONE 100000-0.1 UNIT/GM-% EX CREA: 1.0000 "application " | TOPICAL_CREAM | Freq: Two times a day (BID) | CUTANEOUS | Status: DC

## 2012-10-14 MED ORDER — TRIAMCINOLONE ACETONIDE 0.1 % EX CREA: TOPICAL_CREAM | Freq: Two times a day (BID) | CUTANEOUS | Status: DC

## 2012-10-14 NOTE — Patient Instructions (Addendum)
Continue with weight loss efforts Call in 1-2 weeks if back pain no better.
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
## 2012-10-18 NOTE — Progress Notes (Signed)
Quick Note:  Pt informed and he will work on weight loss ______

## 2012-10-21 ENCOUNTER — Other Ambulatory Visit: Payer: Self-pay | Admitting: *Deleted

## 2012-10-21 DIAGNOSIS — E119 Type 2 diabetes mellitus without complications: Secondary | ICD-10-CM

## 2012-10-21 DIAGNOSIS — L309 Dermatitis, unspecified: Secondary | ICD-10-CM

## 2012-10-21 MED ORDER — NYSTATIN-TRIAMCINOLONE 100000-0.1 UNIT/GM-% EX CREA: 1.0000 "application " | TOPICAL_CREAM | Freq: Two times a day (BID) | CUTANEOUS | Status: DC

## 2012-11-15 ENCOUNTER — Telehealth: Payer: Self-pay | Admitting: Oncology

## 2012-11-15 NOTE — Telephone Encounter (Signed)
LVOM for pt to return call.  °

## 2012-11-16 ENCOUNTER — Telehealth: Payer: Self-pay | Admitting: Oncology

## 2012-11-16 NOTE — Telephone Encounter (Signed)
S/W pt in re NP appt 02/14 @ 1:30 w/Dr. Gaylyn Rong Referring Dr. Sarajane Marek schedule after MD visit.  Welcome packet at registration.

## 2012-11-16 NOTE — Telephone Encounter (Signed)
C/D 11/16/12 for appt. 11/18/12

## 2012-11-18 ENCOUNTER — Ambulatory Visit (HOSPITAL_BASED_OUTPATIENT_CLINIC_OR_DEPARTMENT_OTHER): Payer: BC Managed Care – PPO | Admitting: Lab

## 2012-11-18 ENCOUNTER — Telehealth: Payer: Self-pay | Admitting: Oncology

## 2012-11-18 ENCOUNTER — Other Ambulatory Visit: Payer: BC Managed Care – PPO | Admitting: Lab

## 2012-11-18 ENCOUNTER — Ambulatory Visit: Payer: BC Managed Care – PPO

## 2012-11-18 ENCOUNTER — Encounter: Payer: Self-pay | Admitting: Oncology

## 2012-11-18 ENCOUNTER — Ambulatory Visit (HOSPITAL_BASED_OUTPATIENT_CLINIC_OR_DEPARTMENT_OTHER): Payer: BC Managed Care – PPO | Admitting: Oncology

## 2012-11-18 VITALS — BP 132/90 | HR 119 | Temp 97.6°F | Resp 20 | Ht 69.5 in | Wt 224.1 lb

## 2012-11-18 DIAGNOSIS — C84A0 Cutaneous T-cell lymphoma, unspecified, unspecified site: Secondary | ICD-10-CM

## 2012-11-18 DIAGNOSIS — R229 Localized swelling, mass and lump, unspecified: Secondary | ICD-10-CM

## 2012-11-18 LAB — CBC WITH DIFFERENTIAL/PLATELET
BASO%: 0.5 % (ref 0.0–2.0)
EOS%: 1.7 % (ref 0.0–7.0)
MCH: 29.1 pg (ref 27.2–33.4)
MCHC: 34.9 g/dL (ref 32.0–36.0)
MCV: 83.5 fL (ref 79.3–98.0)
MONO%: 7.4 % (ref 0.0–14.0)
RBC: 5.5 10*6/uL (ref 4.20–5.82)
RDW: 12.6 % (ref 11.0–14.6)
lymph#: 2.6 10*3/uL (ref 0.9–3.3)

## 2012-11-18 LAB — MORPHOLOGY: PLT EST: ADEQUATE

## 2012-11-18 LAB — COMPREHENSIVE METABOLIC PANEL (CC13)
AST: 9 U/L (ref 5–34)
Albumin: 3.6 g/dL (ref 3.5–5.0)
Alkaline Phosphatase: 116 U/L (ref 40–150)
BUN: 15.9 mg/dL (ref 7.0–26.0)
Calcium: 10.1 mg/dL (ref 8.4–10.4)
Creatinine: 1.1 mg/dL (ref 0.7–1.3)
Glucose: 293 mg/dl — ABNORMAL HIGH (ref 70–99)

## 2012-11-18 LAB — HIV ANTIBODY (ROUTINE TESTING W REFLEX): HIV: NONREACTIVE

## 2012-11-18 NOTE — Patient Instructions (Addendum)
1.  Diagnosis:  Skin biopsy with abnormal cells;  Could not rule out cutaneous T-cell lymphoma. 2.  Recommendations:  *  CT chest/abdomen/pelvis   *  Bone marrow biopsy with goal to rule out systemic involvement of disease.   3.  Treatment recommendations:  *  If just cutaneous, then treatment by UV light by Dermatology.  *  If systemic, then chemotherapy.
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
## 2012-11-18 NOTE — Telephone Encounter (Signed)
gv and printed appt schedule for pt for Feb...gv pt barium and advised that central scheduling will call with ctand biopsy

## 2012-11-23 ENCOUNTER — Telehealth: Payer: Self-pay | Admitting: Family Medicine

## 2012-11-23 NOTE — Telephone Encounter (Signed)
Definitely agree.  He should proceed with tests as recommended to rule cutaneous type of lymphoma.

## 2012-11-23 NOTE — Telephone Encounter (Signed)
Pt informed

## 2012-11-23 NOTE — Telephone Encounter (Signed)
Pt states he is having spinal injections and he has been advised he may have skin cancer.  He was referred to dermatologist and is scheduled to have a bone marrow biopsy on Friday.  Pt wants PCP to be aware of what is going on and if he agrees with the testing he is having done with his condition.  Pt requesting call back with advise on what he should do.

## 2012-11-24 ENCOUNTER — Other Ambulatory Visit: Payer: Self-pay | Admitting: Radiology

## 2012-11-24 ENCOUNTER — Other Ambulatory Visit (HOSPITAL_COMMUNITY): Payer: BC Managed Care – PPO

## 2012-11-25 ENCOUNTER — Telehealth: Payer: Self-pay | Admitting: Oncology

## 2012-11-25 ENCOUNTER — Ambulatory Visit (HOSPITAL_COMMUNITY)
Admission: RE | Admit: 2012-11-25 | Discharge: 2012-11-25 | Disposition: A | Discharge status: Home / Self Care | Payer: BC Managed Care – PPO | Source: Ambulatory Visit | Attending: Oncology | Admitting: Oncology

## 2012-11-25 ENCOUNTER — Other Ambulatory Visit: Payer: Self-pay | Admitting: Oncology

## 2012-11-25 ENCOUNTER — Encounter (HOSPITAL_COMMUNITY): Payer: Self-pay

## 2012-11-25 DIAGNOSIS — C8409 Mycosis fungoides, extranodal and solid organ sites: Secondary | ICD-10-CM | POA: Insufficient documentation

## 2012-11-25 DIAGNOSIS — C84A0 Cutaneous T-cell lymphoma, unspecified, unspecified site: Secondary | ICD-10-CM

## 2012-11-25 DIAGNOSIS — K7689 Other specified diseases of liver: Secondary | ICD-10-CM | POA: Insufficient documentation

## 2012-11-25 DIAGNOSIS — K4090 Unilateral inguinal hernia, without obstruction or gangrene, not specified as recurrent: Secondary | ICD-10-CM | POA: Insufficient documentation

## 2012-11-25 DIAGNOSIS — Q8909 Congenital malformations of spleen: Secondary | ICD-10-CM | POA: Insufficient documentation

## 2012-11-25 MED ORDER — IOHEXOL 300 MG/ML  SOLN
100.0000 mL | Freq: Once | INTRAMUSCULAR | Status: AC | PRN
  Administered 2012-11-25: 100 mL via INTRAVENOUS

## 2012-11-25 NOTE — Telephone Encounter (Signed)
s.w .pt and advised on est appt change to march....pt ok and aware

## 2012-11-29 ENCOUNTER — Ambulatory Visit: Payer: BC Managed Care – PPO | Admitting: Oncology

## 2012-11-30 ENCOUNTER — Encounter (HOSPITAL_COMMUNITY): Payer: Self-pay

## 2012-11-30 ENCOUNTER — Ambulatory Visit (HOSPITAL_COMMUNITY)
Admission: RE | Admit: 2012-11-30 | Discharge: 2012-11-30 | Disposition: A | Discharge status: Home / Self Care | Payer: BC Managed Care – PPO | Source: Ambulatory Visit | Attending: Oncology | Admitting: Oncology

## 2012-11-30 DIAGNOSIS — I10 Essential (primary) hypertension: Secondary | ICD-10-CM | POA: Insufficient documentation

## 2012-11-30 DIAGNOSIS — Z79899 Other long term (current) drug therapy: Secondary | ICD-10-CM | POA: Insufficient documentation

## 2012-11-30 DIAGNOSIS — C84A0 Cutaneous T-cell lymphoma, unspecified, unspecified site: Secondary | ICD-10-CM

## 2012-11-30 DIAGNOSIS — R21 Rash and other nonspecific skin eruption: Secondary | ICD-10-CM | POA: Insufficient documentation

## 2012-11-30 DIAGNOSIS — L299 Pruritus, unspecified: Secondary | ICD-10-CM | POA: Insufficient documentation

## 2012-11-30 DIAGNOSIS — M549 Dorsalgia, unspecified: Secondary | ICD-10-CM | POA: Insufficient documentation

## 2012-11-30 DIAGNOSIS — E785 Hyperlipidemia, unspecified: Secondary | ICD-10-CM | POA: Insufficient documentation

## 2012-11-30 DIAGNOSIS — E119 Type 2 diabetes mellitus without complications: Secondary | ICD-10-CM | POA: Insufficient documentation

## 2012-11-30 DIAGNOSIS — R229 Localized swelling, mass and lump, unspecified: Secondary | ICD-10-CM | POA: Insufficient documentation

## 2012-11-30 LAB — PROTIME-INR: INR: 0.92 (ref 0.00–1.49)

## 2012-11-30 LAB — APTT: aPTT: 28 seconds (ref 24–37)

## 2012-11-30 LAB — CBC
HCT: 41.5 % (ref 39.0–52.0)
Hemoglobin: 14.4 g/dL (ref 13.0–17.0)
MCHC: 34.7 g/dL (ref 30.0–36.0)

## 2012-11-30 MED ORDER — MIDAZOLAM HCL 2 MG/2ML IJ SOLN
INTRAMUSCULAR | Status: AC | PRN
  Administered 2012-11-30: 2 mg via INTRAVENOUS

## 2012-11-30 MED ORDER — HYDROCODONE-ACETAMINOPHEN 5-325 MG PO TABS
1.0000 | ORAL_TABLET | Freq: Four times a day (QID) | ORAL | Status: DC | PRN
  Administered 2012-11-30: 2 via ORAL
  Filled 2012-11-30 (×2): qty 2

## 2012-11-30 MED ORDER — HYDROCODONE-ACETAMINOPHEN 5-325 MG PO TABS
1.0000 | ORAL_TABLET | Freq: Once | ORAL | Status: DC
  Filled 2012-11-30: qty 1

## 2012-11-30 MED ORDER — SODIUM CHLORIDE 0.9 % IV SOLN: INTRAVENOUS | Status: DC

## 2012-11-30 MED ORDER — ACETAMINOPHEN 500 MG PO TABS
500.0000 mg | ORAL_TABLET | Freq: Four times a day (QID) | ORAL | Status: DC | PRN
  Filled 2012-11-30: qty 1

## 2012-11-30 MED ORDER — MIDAZOLAM HCL 2 MG/2ML IJ SOLN
INTRAMUSCULAR | Status: AC
  Filled 2012-11-30: qty 6

## 2012-11-30 MED ORDER — FENTANYL CITRATE 0.05 MG/ML IJ SOLN
INTRAMUSCULAR | Status: AC | PRN
  Administered 2012-11-30: 100 ug via INTRAVENOUS

## 2012-11-30 MED ORDER — FENTANYL CITRATE 0.05 MG/ML IJ SOLN
INTRAMUSCULAR | Status: AC
  Filled 2012-11-30: qty 6

## 2012-11-30 NOTE — Procedures (Signed)
CT guided bone marrow aspirate and biopsy.  No immediate complication. 
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
## 2012-12-07 ENCOUNTER — Encounter: Payer: Self-pay | Admitting: Oncology

## 2012-12-07 ENCOUNTER — Other Ambulatory Visit: Payer: Self-pay | Admitting: Oncology

## 2012-12-07 DIAGNOSIS — C8400 Mycosis fungoides, unspecified site: Secondary | ICD-10-CM | POA: Insufficient documentation

## 2012-12-08 ENCOUNTER — Telehealth: Payer: Self-pay | Admitting: Oncology

## 2012-12-08 ENCOUNTER — Ambulatory Visit (HOSPITAL_BASED_OUTPATIENT_CLINIC_OR_DEPARTMENT_OTHER): Payer: BC Managed Care – PPO | Admitting: Oncology

## 2012-12-08 VITALS — BP 146/98 | HR 104 | Temp 96.8°F | Resp 18 | Ht 69.5 in | Wt 228.7 lb

## 2012-12-08 DIAGNOSIS — C8408 Mycosis fungoides, lymph nodes of multiple sites: Secondary | ICD-10-CM

## 2012-12-08 DIAGNOSIS — C8400 Mycosis fungoides, unspecified site: Secondary | ICD-10-CM

## 2012-12-08 DIAGNOSIS — L298 Other pruritus: Secondary | ICD-10-CM

## 2012-12-08 NOTE — Telephone Encounter (Signed)
gv and printed appt schedule to pt for May.. °

## 2012-12-08 NOTE — Patient Instructions (Addendum)
1.  Diagnosis: mycosis fungoides (MF). 2.  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  3.  Treatment options:  *  Radiation  *  UV treatment.  *  Chemotherapy afterward.  4.  I will refer you to an academic center that does either radiation or UV treatment for this disease and I will follow up with you afterward for potential chemo if needed.
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
## 2012-12-15 ENCOUNTER — Telehealth: Payer: Self-pay | Admitting: Oncology

## 2012-12-15 NOTE — Telephone Encounter (Signed)
gv referral to [facility]..she was already aware and working on it...she will call pt

## 2012-12-21 ENCOUNTER — Telehealth: Payer: Self-pay | Admitting: Oncology

## 2012-12-21 NOTE — Telephone Encounter (Signed)
Pt appt with Dr. Sharman Crate [facility] is 02/07/13@8 :30 and Appt with Dr. Oliver Hum @ [facility] is 02/13/13@9 :30. Medical records faxed. Pt is aware.

## 2013-01-19 DIAGNOSIS — F329 Major depressive disorder, single episode, unspecified: Secondary | ICD-10-CM | POA: Insufficient documentation

## 2013-01-19 DIAGNOSIS — F32A Depression, unspecified: Secondary | ICD-10-CM | POA: Insufficient documentation

## 2013-02-02 ENCOUNTER — Ambulatory Visit (HOSPITAL_BASED_OUTPATIENT_CLINIC_OR_DEPARTMENT_OTHER): Payer: BC Managed Care – PPO | Admitting: Oncology

## 2013-02-02 ENCOUNTER — Telehealth: Payer: Self-pay | Admitting: Oncology

## 2013-02-02 VITALS — BP 142/88 | HR 125 | Temp 97.1°F | Resp 18 | Ht 69.0 in | Wt 228.1 lb

## 2013-02-02 DIAGNOSIS — C8409 Mycosis fungoides, extranodal and solid organ sites: Secondary | ICD-10-CM

## 2013-02-02 DIAGNOSIS — C8400 Mycosis fungoides, unspecified site: Secondary | ICD-10-CM
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
## 2013-02-03 ENCOUNTER — Encounter: Payer: Self-pay | Admitting: Family Medicine

## 2013-02-03 ENCOUNTER — Ambulatory Visit (INDEPENDENT_AMBULATORY_CARE_PROVIDER_SITE_OTHER): Payer: BC Managed Care – PPO | Admitting: Family Medicine

## 2013-02-03 VITALS — BP 120/80 | Temp 97.9°F | Wt 232.0 lb

## 2013-02-03 DIAGNOSIS — R0609 Other forms of dyspnea: Secondary | ICD-10-CM

## 2013-02-03 DIAGNOSIS — I10 Essential (primary) hypertension: Secondary | ICD-10-CM

## 2013-02-03 DIAGNOSIS — R0989 Other specified symptoms and signs involving the circulatory and respiratory systems: Secondary | ICD-10-CM

## 2013-02-03 DIAGNOSIS — E119 Type 2 diabetes mellitus without complications: Secondary | ICD-10-CM

## 2013-02-03 DIAGNOSIS — R0600 Dyspnea, unspecified: Secondary | ICD-10-CM

## 2013-02-03 DIAGNOSIS — E785 Hyperlipidemia, unspecified: Secondary | ICD-10-CM

## 2013-02-03 LAB — HEMOGLOBIN A1C: Hgb A1c MFr Bld: 7.8 % — ABNORMAL HIGH (ref 4.6–6.5)

## 2013-02-03 NOTE — Patient Instructions (Addendum)
We will call you regarding cardiology appointment. You need to lose some weight We may be starting some insulin depending on your A1C level.
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
## 2013-02-07 ENCOUNTER — Institutional Professional Consult (permissible substitution): Payer: BC Managed Care – PPO | Admitting: Cardiology

## 2013-02-07 NOTE — Progress Notes (Signed)
Quick Note:  Pt informed ______ 

## 2013-02-13 ENCOUNTER — Ambulatory Visit: Payer: BC Managed Care – PPO | Admitting: Family Medicine

## 2013-02-20 ENCOUNTER — Encounter: Payer: Self-pay | Admitting: Family Medicine

## 2013-03-03 ENCOUNTER — Other Ambulatory Visit: Payer: BC Managed Care – PPO | Admitting: Lab

## 2013-03-03 ENCOUNTER — Ambulatory Visit: Payer: BC Managed Care – PPO | Admitting: Oncology

## 2013-04-03 ENCOUNTER — Telehealth: Payer: Self-pay | Admitting: Oncology

## 2013-04-03 ENCOUNTER — Ambulatory Visit (HOSPITAL_BASED_OUTPATIENT_CLINIC_OR_DEPARTMENT_OTHER): Payer: BC Managed Care – PPO | Admitting: Oncology

## 2013-04-03 ENCOUNTER — Other Ambulatory Visit (HOSPITAL_BASED_OUTPATIENT_CLINIC_OR_DEPARTMENT_OTHER): Payer: BC Managed Care – PPO | Admitting: Lab

## 2013-04-03 VITALS — BP 148/92 | HR 102 | Temp 98.2°F | Resp 18 | Ht 69.0 in | Wt 222.9 lb

## 2013-04-03 DIAGNOSIS — C8409 Mycosis fungoides, extranodal and solid organ sites: Secondary | ICD-10-CM

## 2013-04-03 DIAGNOSIS — C8400 Mycosis fungoides, unspecified site: Secondary | ICD-10-CM

## 2013-04-03 LAB — COMPREHENSIVE METABOLIC PANEL (CC13)
Albumin: 3.7 g/dL (ref 3.5–5.0)
BUN: 6.7 mg/dL — ABNORMAL LOW (ref 7.0–26.0)
CO2: 25 mEq/L (ref 22–29)
Calcium: 9.4 mg/dL (ref 8.4–10.4)
Chloride: 103 mEq/L (ref 98–109)
Glucose: 238 mg/dl — ABNORMAL HIGH (ref 70–140)
Potassium: 3.8 mEq/L (ref 3.5–5.1)

## 2013-04-03 LAB — CBC WITH DIFFERENTIAL/PLATELET
Basophils Absolute: 0 10*3/uL (ref 0.0–0.1)
Eosinophils Absolute: 0.2 10*3/uL (ref 0.0–0.5)
HGB: 14.7 g/dL (ref 13.0–17.1)
MCV: 81.6 fL (ref 79.3–98.0)
MONO%: 9.8 % (ref 0.0–14.0)
NEUT#: 4.4 10*3/uL (ref 1.5–6.5)
RDW: 12.1 % (ref 11.0–14.6)
lymph#: 0.9 10*3/uL (ref 0.9–3.3)
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
## 2013-04-03 NOTE — Telephone Encounter (Signed)
, °

## 2013-04-04 ENCOUNTER — Institutional Professional Consult (permissible substitution): Payer: BC Managed Care – PPO | Admitting: Cardiology

## 2013-04-12 ENCOUNTER — Telehealth: Payer: Self-pay | Admitting: *Deleted

## 2013-04-12 MED ORDER — HYDROCODONE-ACETAMINOPHEN 5-325 MG PO TABS: 1.0000 | ORAL_TABLET | Freq: Four times a day (QID) | ORAL | Status: DC | PRN
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
## 2013-04-12 NOTE — Telephone Encounter (Signed)
Informed pt of Dr. Lodema Pilot message below.  He verbalized understanding and will continue to try to reach [facility] for further orders.  Asked if he has ever taken hydrocodone in the past,  If he is allergic, since he has morphine allergy.  Pt states has taken hydrocodone w/o any adverse reaction.  Informed will call in Rx to his pharmacy,  [facility].  He verbalized understanding.

## 2013-04-12 NOTE — Telephone Encounter (Signed)
They have to call [facility] for the cream since it may interfere with the radiation.  Please call in Lortab 5/325, one tab every 6 hours prn #90 and no refill.  This Lortab order is one time thing since [facility] treated him.

## 2013-04-13 ENCOUNTER — Encounter: Payer: Self-pay | Admitting: Family Medicine

## 2013-04-13 NOTE — Progress Notes (Signed)
Received call yesterday from [facility] from Dr. Delford Field.Patient has mycosis fungoides and was having significant skin pain. They requested that we right prescription for oxycodone so that patient did not have to travel all the way to [facility]. However, I called patient this morning he states his pain is dramatically improved since he has spoken with them. He wishes to hold off on any pain medications at this time.

## 2013-04-13 NOTE — Progress Notes (Deleted)
  Subjective:    Patient ID: Eric Price, male    DOB: 1950-10-31, 62 y.o.   MRN: [id number]  HPI    Review of Systems     Objective:   Physical Exam        Assessment & Plan:

## 2013-05-11 ENCOUNTER — Telehealth: Payer: Self-pay | Admitting: Hematology and Oncology

## 2013-05-11 NOTE — Telephone Encounter (Signed)
S/W PT IN RE TO TIME CHANGE TO 10 LAB/10:30MD PT CONFIRM TIME.

## 2013-05-15 ENCOUNTER — Encounter: Payer: Self-pay | Admitting: Family Medicine

## 2013-05-15 ENCOUNTER — Ambulatory Visit (INDEPENDENT_AMBULATORY_CARE_PROVIDER_SITE_OTHER): Payer: Self-pay | Admitting: Family Medicine

## 2013-05-15 VITALS — BP 132/82 | HR 99 | Temp 98.7°F | Wt 226.0 lb

## 2013-05-15 DIAGNOSIS — C8400 Mycosis fungoides, unspecified site: Secondary | ICD-10-CM

## 2013-05-15 DIAGNOSIS — E119 Type 2 diabetes mellitus without complications: Secondary | ICD-10-CM

## 2013-05-15 DIAGNOSIS — C8409 Mycosis fungoides, extranodal and solid organ sites: Secondary | ICD-10-CM

## 2013-05-15 DIAGNOSIS — I10 Essential (primary) hypertension: Secondary | ICD-10-CM

## 2013-05-15 DIAGNOSIS — E785 Hyperlipidemia, unspecified: Secondary | ICD-10-CM

## 2013-05-15 LAB — HM DIABETES EYE EXAM

## 2013-05-15 LAB — HEMOGLOBIN A1C: Hgb A1c MFr Bld: 7.1 % — ABNORMAL HIGH (ref 4.6–6.5)

## 2013-05-15 LAB — HM DIABETES FOOT EXAM: HM Diabetic Foot Exam: NORMAL

## 2013-05-15 NOTE — Patient Instructions (Addendum)
Continue weight loss efforts.
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
## 2013-05-17 ENCOUNTER — Telehealth: Payer: Self-pay | Admitting: Oncology

## 2013-05-17 NOTE — Telephone Encounter (Signed)
pt called to r/s appt due to not beeing done with tx at [facility] needed to r/s 2weeks after [facility] app on 8.19.14.Marland KitchenMarland KitchenMarland KitchenDone

## 2013-05-18 ENCOUNTER — Ambulatory Visit: Payer: BC Managed Care – PPO

## 2013-05-18 ENCOUNTER — Other Ambulatory Visit: Payer: BC Managed Care – PPO | Admitting: Lab

## 2013-06-07 ENCOUNTER — Telehealth: Payer: Self-pay | Admitting: Hematology and Oncology

## 2013-06-07 ENCOUNTER — Other Ambulatory Visit: Payer: BC Managed Care – PPO | Admitting: Lab

## 2013-06-07 ENCOUNTER — Institutional Professional Consult (permissible substitution): Payer: BC Managed Care – PPO | Admitting: Cardiology

## 2013-06-07 ENCOUNTER — Ambulatory Visit: Payer: BC Managed Care – PPO

## 2013-06-07 NOTE — Telephone Encounter (Signed)
pt called to r/s appt due to having appt at [facility].Marland KitchenMarland KitchenMarland KitchenDone

## 2013-06-08 ENCOUNTER — Other Ambulatory Visit: Payer: Self-pay | Admitting: Family Medicine

## 2013-06-09 ENCOUNTER — Other Ambulatory Visit (HOSPITAL_BASED_OUTPATIENT_CLINIC_OR_DEPARTMENT_OTHER): Payer: BC Managed Care – PPO

## 2013-06-09 ENCOUNTER — Ambulatory Visit (HOSPITAL_BASED_OUTPATIENT_CLINIC_OR_DEPARTMENT_OTHER): Payer: BC Managed Care – PPO | Admitting: Hematology and Oncology

## 2013-06-09 ENCOUNTER — Telehealth: Payer: Self-pay | Admitting: Hematology and Oncology

## 2013-06-09 VITALS — BP 141/87 | HR 91 | Temp 97.9°F | Resp 20 | Ht 69.0 in | Wt 233.3 lb

## 2013-06-09 DIAGNOSIS — C8409 Mycosis fungoides, extranodal and solid organ sites: Secondary | ICD-10-CM

## 2013-06-09 DIAGNOSIS — C8400 Mycosis fungoides, unspecified site: Secondary | ICD-10-CM

## 2013-06-09 LAB — COMPREHENSIVE METABOLIC PANEL (CC13)
Alkaline Phosphatase: 76 U/L (ref 40–150)
BUN: 13.5 mg/dL (ref 7.0–26.0)
Creatinine: 0.8 mg/dL (ref 0.7–1.3)
Glucose: 231 mg/dl — ABNORMAL HIGH (ref 70–140)
Total Bilirubin: 0.33 mg/dL (ref 0.20–1.20)

## 2013-06-09 LAB — CBC WITH DIFFERENTIAL/PLATELET
Basophils Absolute: 0 10*3/uL (ref 0.0–0.1)
Eosinophils Absolute: 0.1 10*3/uL (ref 0.0–0.5)
HGB: 13.7 g/dL (ref 13.0–17.1)
LYMPH%: 30.7 % (ref 14.0–49.0)
MCH: 28.1 pg (ref 27.2–33.4)
MCV: 82.3 fL (ref 79.3–98.0)
MONO%: 8.7 % (ref 0.0–14.0)
NEUT#: 3 10*3/uL (ref 1.5–6.5)
Platelets: 272 10*3/uL (ref 140–400)

## 2013-06-09 NOTE — Telephone Encounter (Signed)
s.w. pt and advised on 11.6.14 appt...pt ok and aware
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
## 2013-06-30 ENCOUNTER — Institutional Professional Consult (permissible substitution): Payer: BC Managed Care – PPO | Admitting: Cardiology

## 2013-06-30 ENCOUNTER — Ambulatory Visit (INDEPENDENT_AMBULATORY_CARE_PROVIDER_SITE_OTHER): Payer: BC Managed Care – PPO | Admitting: Cardiology

## 2013-06-30 ENCOUNTER — Encounter: Payer: Self-pay | Admitting: Cardiology

## 2013-06-30 VITALS — BP 136/88 | HR 93 | Ht 69.0 in | Wt 233.0 lb

## 2013-06-30 DIAGNOSIS — R0600 Dyspnea, unspecified: Secondary | ICD-10-CM | POA: Insufficient documentation

## 2013-06-30 DIAGNOSIS — E785 Hyperlipidemia, unspecified: Secondary | ICD-10-CM

## 2013-06-30 DIAGNOSIS — R0609 Other forms of dyspnea: Secondary | ICD-10-CM

## 2013-06-30 DIAGNOSIS — Z79899 Other long term (current) drug therapy: Secondary | ICD-10-CM

## 2013-06-30 DIAGNOSIS — R0989 Other specified symptoms and signs involving the circulatory and respiratory systems: Secondary | ICD-10-CM

## 2013-06-30 MED ORDER — ATORVASTATIN CALCIUM 40 MG PO TABS: 40.0000 mg | ORAL_TABLET | Freq: Every day | ORAL | Status: DC
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
## 2013-06-30 NOTE — Patient Instructions (Addendum)
Please start Lipitor (Atorvastatin) 40 mg a day.  Stop Pravastatin. Continue all other medications as listed.  Return fasting in 8 weeks for lab work. (lipid and liver)  Your physician has requested that you have a lexiscan myoview. For further information please visit [email or web address] Please follow instruction sheet, as given.  Further follow up will be based on these results.

## 2013-07-14 ENCOUNTER — Institutional Professional Consult (permissible substitution): Payer: BC Managed Care – PPO | Admitting: Cardiology

## 2013-07-31 ENCOUNTER — Other Ambulatory Visit: Payer: Self-pay | Admitting: Family Medicine

## 2013-08-02 ENCOUNTER — Telehealth: Payer: Self-pay | Admitting: Hematology and Oncology

## 2013-08-02 NOTE — Telephone Encounter (Signed)
pt aware of appt on 11/6 shh

## 2013-08-02 NOTE — Telephone Encounter (Signed)
sw pt aware of 11/6 appt shh

## 2013-08-02 NOTE — Telephone Encounter (Signed)
sw pt made aware of 11/06 appt shh

## 2013-08-10 ENCOUNTER — Ambulatory Visit (HOSPITAL_BASED_OUTPATIENT_CLINIC_OR_DEPARTMENT_OTHER): Payer: BC Managed Care – PPO | Admitting: Hematology and Oncology

## 2013-08-10 ENCOUNTER — Other Ambulatory Visit: Payer: Self-pay

## 2013-08-10 ENCOUNTER — Encounter: Payer: Self-pay | Admitting: Hematology and Oncology

## 2013-08-10 ENCOUNTER — Telehealth: Payer: Self-pay | Admitting: Hematology and Oncology

## 2013-08-10 ENCOUNTER — Ambulatory Visit (HOSPITAL_BASED_OUTPATIENT_CLINIC_OR_DEPARTMENT_OTHER): Payer: BC Managed Care – PPO | Admitting: Lab

## 2013-08-10 VITALS — BP 159/98 | HR 121 | Temp 97.0°F | Resp 18 | Ht 69.0 in | Wt 221.8 lb

## 2013-08-10 DIAGNOSIS — C8409 Mycosis fungoides, extranodal and solid organ sites: Secondary | ICD-10-CM

## 2013-08-10 DIAGNOSIS — C8400 Mycosis fungoides, unspecified site: Secondary | ICD-10-CM

## 2013-08-10 LAB — COMPREHENSIVE METABOLIC PANEL (CC13)
ALT: 29 U/L (ref 0–55)
AST: 17 U/L (ref 5–34)
Albumin: 4.1 g/dL (ref 3.5–5.0)
Alkaline Phosphatase: 136 U/L (ref 40–150)
Anion Gap: 12 mEq/L — ABNORMAL HIGH (ref 3–11)
BUN: 11.3 mg/dL (ref 7.0–26.0)
CO2: 20 mEq/L — ABNORMAL LOW (ref 22–29)
Calcium: 9.4 mg/dL (ref 8.4–10.4)
Chloride: 104 mEq/L (ref 98–109)
Creatinine: 1 mg/dL (ref 0.7–1.3)
Glucose: 255 mg/dl — ABNORMAL HIGH (ref 70–140)
Potassium: 4.1 mEq/L (ref 3.5–5.1)
Sodium: 136 mEq/L (ref 136–145)
Total Bilirubin: 0.49 mg/dL (ref 0.20–1.20)
Total Protein: 7.4 g/dL (ref 6.4–8.3)

## 2013-08-10 LAB — CBC WITH DIFFERENTIAL/PLATELET
BASO%: 0.5 % (ref 0.0–2.0)
Basophils Absolute: 0 10*3/uL (ref 0.0–0.1)
EOS%: 0.7 % (ref 0.0–7.0)
Eosinophils Absolute: 0.1 10*3/uL (ref 0.0–0.5)
HCT: 46.7 % (ref 38.4–49.9)
HGB: 15.4 g/dL (ref 13.0–17.1)
LYMPH%: 23.1 % (ref 14.0–49.0)
MCH: 27 pg — ABNORMAL LOW (ref 27.2–33.4)
MCHC: 33 g/dL (ref 32.0–36.0)
MCV: 82 fL (ref 79.3–98.0)
MONO#: 0.6 10*3/uL (ref 0.1–0.9)
MONO%: 6.6 % (ref 0.0–14.0)
NEUT#: 6.2 10*3/uL (ref 1.5–6.5)
NEUT%: 69.1 % (ref 39.0–75.0)
Platelets: 252 10*3/uL (ref 140–400)
RBC: 5.7 10*6/uL (ref 4.20–5.82)
RDW: 12.6 % (ref 11.0–14.6)
WBC: 9 10*3/uL (ref 4.0–10.3)
lymph#: 2.1 10*3/uL (ref 0.9–3.3)

## 2013-08-10 LAB — LACTATE DEHYDROGENASE (CC13): LDH: 163 U/L (ref 125–245)

## 2013-08-10 NOTE — Progress Notes (Signed)
[facility] OFFICE PROGRESS NOTE  Patient Care Team: Kristian Covey, MD as PCP - General (Family Medicine) Claudie Revering, MD as Consulting Physician (Dermatology) Exie Parody, MD (Hematology and Oncology) Delrae Sawyers as Referring Physician (Orthopedic Surgery) Theodora Blow as Physician Assistant (Hematology and Oncology) Provider Not In System as Registered Nurse  DIAGNOSIS: T3, N0, M0, B0 mycosis fungoides, stage IIB  SUMMARY OF ONCOLOGIC HISTORY: This is a patient was diagnosed with mycosis fungoides early this year. He has noticed enlarging skin nodules for over 8 years, associated with itching, not relieved by topical steroid cream. He had complete staging including bone marrow aspirate and biopsy early this year. He was placed on stage II disease and was referred to [facility] for further evaluation. He received whole body irradiation with Dr. Carley Hammed, [facility] x 7 weeks  02/2013.-04/2013. After radiation is completed, he was placed on adjuvant interferon therapy. He has been on interferon for over 4 weeks now.  INTERVAL HISTORY: Eric Price 62 y.o. male returns for further followup. He complained of persistent dry skin and dry mouth. He has significant itching on his skin recently and whenever he scratches skin he noticed some redness appearing. He also complained of very dry skin. He denies any lymphadenopathy. He denies any recent fever, chills, night sweats or abnormal weight loss  I have reviewed the past medical history, past surgical history, social history and family history with the patient and they are unchanged from previous note.  ALLERGIES:  is allergic to morphine and morphine and related.  MEDICATIONS:  Current Outpatient Prescriptions  Medication Sig Dispense Refill  . atorvastatin (LIPITOR) 40 MG tablet Take 1 tablet (40 mg total) by mouth daily.  30 tablet  11  . cyclobenzaprine (FLEXERIL) 10 MG tablet Take 1 tablet (10 mg total) by  mouth 3 (three) times daily as needed for muscle spasms.  30 tablet  1  . ezetimibe (ZETIA) 10 MG tablet Take 1 tablet (10 mg total) by mouth daily.  90 tablet  3  . gabapentin (NEURONTIN) 300 MG capsule Take 300 mg by mouth daily. Per Delrae Sawyers, MD orthopedics      . glyBURIDE (DIABETA) 5 MG tablet Take 1 tablet (5 mg total) by mouth 2 (two) times daily with a meal.  60 tablet  5  . hydrOXYzine (ATARAX/VISTARIL) 10 MG tablet Take 10 mg by mouth 3 (three) times daily as needed.      . interferon alfa-2b (INTRON-A) 6000000 UNIT/ML injection Inject 0.5 cc (3 Mu) three times a week subcutaneously      . losartan (COZAAR) 50 MG tablet Take 1 tablet (50 mg total) by mouth daily.  30 tablet  5  . metFORMIN (GLUCOPHAGE) 500 MG tablet Take 2 tablets (1,000 mg total) by mouth 2 (two) times daily with a meal.  120 tablet  5  . mupirocin cream (BACTROBAN) 2 % Apply 1 application topically 3 (three) times daily.      Marland Kitchen triamcinolone cream (KENALOG) 0.1 % APPLY TOPICALLY TWO TIMES DAILY TO AFFECTED AREAS  454 g  1   No current facility-administered medications for this visit.    REVIEW OF SYSTEMS:   Constitutional: Denies fevers, chills or abnormal weight loss Eyes: Denies blurriness of vision Ears, nose, mouth, throat, and face: Denies mucositis or sore throat Respiratory: Denies cough, dyspnea or wheezes Cardiovascular: Denies palpitation, chest discomfort or lower extremity swelling Gastrointestinal:  Denies nausea, heartburn or change in bowel habits Lymphatics: Denies  new lymphadenopathy or easy bruising Neurological:Denies numbness, tingling or new weaknesses Behavioral/Psych: Mood is stable, no new changes  All other systems were reviewed with the patient and are negative.  PHYSICAL EXAMINATION: ECOG PERFORMANCE STATUS: 1 - Symptomatic but completely ambulatory  Filed Vitals:   08/10/13 1412  BP: 159/98  Pulse: 121  Temp: 97 F (36.1 C)  Resp: 18   Filed Weights   08/10/13 1412   Weight: 221 lb 12.8 oz (100.608 kg)    GENERAL:alert, no distress and comfortable. He is morbidly obese SKIN: skin color, texture, turgor are normal, no rashes or significant lesions. Noted previously healed scarring from prior mycosis fungoides EYES: normal, Conjunctiva are pink and non-injected, sclera clear OROPHARYNX:no exudate, no erythema and lips, buccal mucosa, and tongue normal  NECK: supple, thyroid normal size, non-tender, without nodularity LYMPH:  no palpable lymphadenopathy in the cervical, axillary or inguinal LUNGS: clear to auscultation and percussion with normal breathing effort HEART: regular rate & rhythm and no murmurs and no lower extremity edema ABDOMEN:abdomen soft, non-tender and normal bowel sounds Musculoskeletal:no cyanosis of digits and no clubbing  NEURO: alert & oriented x 3 with fluent speech, no focal motor/sensory deficits  LABORATORY DATA:  I have reviewed the data as listed    Component Value Date/Time   NA 137 06/09/2013 0950   NA 140 07/13/2012 1129   K 3.7 06/09/2013 0950   K 4.6 07/13/2012 1129   CL 103 11/18/2012 1458   CL 104 07/13/2012 1129   CO2 23 06/09/2013 0950   CO2 28 07/13/2012 1129   GLUCOSE 231* 06/09/2013 0950   GLUCOSE 293* 11/18/2012 1458   GLUCOSE 125* 07/13/2012 1129   BUN 13.5 06/09/2013 0950   BUN 14 07/13/2012 1129   CREATININE 0.8 06/09/2013 0950   CREATININE 1.1 07/13/2012 1129   CALCIUM 9.0 06/09/2013 0950   CALCIUM 9.4 07/13/2012 1129   PROT 6.6 06/09/2013 0950   PROT 7.3 07/13/2012 1129   ALBUMIN 3.6 06/09/2013 0950   ALBUMIN 4.1 07/13/2012 1129   AST 17 06/09/2013 0950   AST 15 07/13/2012 1129   ALT 26 06/09/2013 0950   ALT 21 07/13/2012 1129   ALKPHOS 76 06/09/2013 0950   ALKPHOS 95 07/13/2012 1129   BILITOT 0.33 06/09/2013 0950   BILITOT 0.6 07/13/2012 1129   GFRNONAA >60 12/29/2010 0524   GFRAA  Value: >60        The eGFR has been calculated using the MDRD equation. This calculation has not been validated in all clinical situations. eGFR's  persistently <60 mL/min signify possible Chronic Kidney Disease. 12/29/2010 0524    No results found for this basename: SPEP, UPEP,  kappa and lambda light chains    Lab Results  Component Value Date   WBC 5.3 06/09/2013   NEUTROABS 3.0 06/09/2013   HGB 13.7 06/09/2013   HCT 40.2 06/09/2013   MCV 82.3 06/09/2013   PLT 272 06/09/2013      Chemistry      Component Value Date/Time   NA 137 06/09/2013 0950   NA 140 07/13/2012 1129   K 3.7 06/09/2013 0950   K 4.6 07/13/2012 1129   CL 103 11/18/2012 1458   CL 104 07/13/2012 1129   CO2 23 06/09/2013 0950   CO2 28 07/13/2012 1129   BUN 13.5 06/09/2013 0950   BUN 14 07/13/2012 1129   CREATININE 0.8 06/09/2013 0950   CREATININE 1.1 07/13/2012 1129      Component Value Date/Time   CALCIUM 9.0 06/09/2013  0950   CALCIUM 9.4 07/13/2012 1129   ALKPHOS 76 06/09/2013 0950   ALKPHOS 95 07/13/2012 1129   AST 17 06/09/2013 0950   AST 15 07/13/2012 1129   ALT 26 06/09/2013 0950   ALT 21 07/13/2012 1129   BILITOT 0.33 06/09/2013 0950   BILITOT 0.6 07/13/2012 1129     ASSESSMENT:  #1 mycosis fungoides #2 dry skin  PLAN:  #1 mycosis fungoides I would defer to his physicians at [facility] for further treatment. In my opinion, he is no evidence of active disease. I reassured the patient. I will go ahead and order a CBC with differential CMP and LDH per patient request. #2 dry skin I suspect his itchiness is because of significant dryness on his skin. I recommend a malignant cream once or twice a day to prevent dry skin which typically can exacerbate sensation of skin itchiness Have not make a return appointment for the patient to come back as he is getting active care through [facility]. I gave him my business card and encouraged him to call me back if he needs to be seen back in the office.  Orders Placed This Encounter  Procedures  . CBC with Differential    Standing Status: Future     Number of Occurrences: 1     Standing Expiration Date: 05/02/2014  .  Comprehensive metabolic panel    Standing Status: Future     Number of Occurrences: 1     Standing Expiration Date: 08/10/2014  . Lactate dehydrogenase    Standing Status: Future     Number of Occurrences: 1     Standing Expiration Date: 08/10/2014   All questions were answered. The patient knows to call the clinic with any problems, questions or concerns. No barriers to learning was detected. I spent 25 minutes counseling the patient face to face. The total time spent in the appointment was 40 minutes and more than 50% was on counseling and review of test results     [facility], Zannie Runkle, MD 08/10/2013 2:51 PM

## 2013-08-10 NOTE — Telephone Encounter (Signed)
GV AND PRINTED AVS FOR PT AND SENT PT TO LAB

## 2013-08-17 ENCOUNTER — Encounter (HOSPITAL_COMMUNITY): Payer: BC Managed Care – PPO

## 2013-08-17 ENCOUNTER — Other Ambulatory Visit: Payer: BC Managed Care – PPO

## 2013-09-04 ENCOUNTER — Other Ambulatory Visit: Payer: Self-pay | Admitting: Family Medicine

## 2013-09-05 ENCOUNTER — Other Ambulatory Visit (INDEPENDENT_AMBULATORY_CARE_PROVIDER_SITE_OTHER): Payer: BC Managed Care – PPO

## 2013-09-05 ENCOUNTER — Encounter: Payer: Self-pay | Admitting: Cardiology

## 2013-09-05 ENCOUNTER — Ambulatory Visit (HOSPITAL_COMMUNITY): Payer: BC Managed Care – PPO | Attending: Cardiology | Admitting: Radiology

## 2013-09-05 VITALS — BP 136/95 | HR 88 | Ht 69.0 in | Wt 221.0 lb

## 2013-09-05 DIAGNOSIS — R0602 Shortness of breath: Secondary | ICD-10-CM

## 2013-09-05 DIAGNOSIS — I2510 Atherosclerotic heart disease of native coronary artery without angina pectoris: Secondary | ICD-10-CM | POA: Insufficient documentation

## 2013-09-05 DIAGNOSIS — I10 Essential (primary) hypertension: Secondary | ICD-10-CM | POA: Insufficient documentation

## 2013-09-05 DIAGNOSIS — Z8249 Family history of ischemic heart disease and other diseases of the circulatory system: Secondary | ICD-10-CM | POA: Insufficient documentation

## 2013-09-05 DIAGNOSIS — E785 Hyperlipidemia, unspecified: Secondary | ICD-10-CM

## 2013-09-05 DIAGNOSIS — E119 Type 2 diabetes mellitus without complications: Secondary | ICD-10-CM | POA: Insufficient documentation

## 2013-09-05 DIAGNOSIS — Z01810 Encounter for preprocedural cardiovascular examination: Secondary | ICD-10-CM | POA: Insufficient documentation

## 2013-09-05 DIAGNOSIS — Z79899 Other long term (current) drug therapy: Secondary | ICD-10-CM

## 2013-09-05 DIAGNOSIS — R0600 Dyspnea, unspecified: Secondary | ICD-10-CM

## 2013-09-05 LAB — LIPID PANEL
Cholesterol: 158 mg/dL (ref 0–200)
HDL: 34.5 mg/dL — ABNORMAL LOW (ref >39.00)
Total CHOL/HDL Ratio: 5
VLDL: 30.2 mg/dL (ref 0.0–40.0)

## 2013-09-05 LAB — HEPATIC FUNCTION PANEL
ALT: 38 U/L (ref 0–53)
Albumin: 4.1 g/dL (ref 3.5–5.2)
Total Bilirubin: 0.6 mg/dL (ref 0.3–1.2)

## 2013-09-05 MED ORDER — REGADENOSON 0.4 MG/5ML IV SOLN
0.4000 mg | Freq: Once | INTRAVENOUS | Status: AC
  Administered 2013-09-05: 0.4 mg via INTRAVENOUS

## 2013-09-05 MED ORDER — TECHNETIUM TC 99M SESTAMIBI GENERIC - CARDIOLITE
33.0000 | Freq: Once | INTRAVENOUS | Status: AC | PRN
  Administered 2013-09-05: 33 via INTRAVENOUS

## 2013-09-05 MED ORDER — TECHNETIUM TC 99M SESTAMIBI GENERIC - CARDIOLITE
11.0000 | Freq: Once | INTRAVENOUS | Status: AC | PRN
  Administered 2013-09-05: 11 via INTRAVENOUS

## 2013-09-05 NOTE — Progress Notes (Signed)
[facility] SITE 3 NUCLEAR MED [address] [place], [place] [address] [phone]    Cardiology Nuclear Med Study  Eric Price is a 62 y.o. male     MRN : [id number]     DOB: 01-20-1951  Procedure Date: 09/05/2013  Nuclear Med Background Indication for Stress Test:  Evaluation for Ischemia, Pending Surgical Clearance on (L) Shoulder Surgery by Dr. Madilyn Fireman [facility], ) History:  CAD, Cath 2004 (mild n/o disease), MPI 2003 (midapical) EF 47% Cardiac Risk Factors: Family History - CAD, Hypertension, Lipids and NIDDM  Symptoms:  SOB   Nuclear Pre-Procedure Caffeine/Decaff Intake:  None > 12 hrs NPO After: 9:00pm   Lungs:  clear O2 Sat: 95% on room air. IV 0.9% NS with Angio Cath:  20g  IV Site: R Antecubital x 1, tolerated well IV Started by:  Irean Hong, RN  Chest Size (in):  48 Cup Size: n/a  Height: 5\' 9"  (1.753 m)  Weight:  221 lb (100.245 kg)  BMI:  Body mass index is 32.62 kg/(m^2). Tech Comments:  No Glyburide, metformin, or any medications this am    Nuclear Med Study 1 or 2 day study: 1 day  Stress Test Type:  Treadmill/Lexiscan  Reading MD: Olga Millers, MD  Order Authorizing Provider:  Rollene Rotunda, MD  Resting Radionuclide: Technetium 54m Sestamibi  Resting Radionuclide Dose: 10.8 mCi   Stress Radionuclide:  Technetium 46m Sestamibi  Stress Radionuclide Dose: 32.7 mCi           Stress Protocol Rest HR: 88 Stress HR: 122  Rest BP: 136/95 Stress BP: 160/94  Exercise Time (min): n/a METS: n/a   Predicted Max HR: 157 bpm % Max HR: 77.71 bpm Rate Pressure Product: 46962   Dose of Adenosine (mg):  n/a Dose of Lexiscan: 0.4 mg  Dose of Atropine (mg): n/a Dose of Dobutamine: n/a mcg/kg/min (at max HR)  Stress Test Technologist: Nelson Chimes, BS-ES  Nuclear Technologist:  Domenic Polite, CNMT     Rest Procedure:  Myocardial perfusion imaging was performed at rest 45 minutes following the intravenous administration of Technetium 70m  Sestamibi. Rest ECG: NSR, anterior MI.  Stress Procedure:  The patient received IV Lexiscan 0.4 mg over 15-seconds with concurrent low level exercise and then Technetium 20m Sestamibi was injected at 30-seconds while the patient continued walking one more minute.  Quantitative spect images were obtained after a 45-minute delay. During the infusion of Lexiscan, the patient complained of a fatigued feeling and chest tightness.  These symptoms began to resolve in recovery.  Stress ECG: No significant ST segment change suggestive of ischemia.  QPS Raw Data Images:  Acquisition technically good; normal left ventricular size. Stress Images:  There is decreased uptake in the apex. Rest Images:  There is decreased uptake in the apex. Subtraction (SDS):  No evidence of ischemia. Transient Ischemic Dilatation (Normal <1.22):  1.01 Lung/Heart Ratio (Normal <0.45):  0.26  Quantitative Gated Spect Images QGS EDV:  119 ml QGS ESV:  74 ml  Impression Exercise Capacity:  Lexiscan with low level exercise. BP Response:  Normal blood pressure response. Clinical Symptoms:  There is chest tightness. ECG Impression:  No significant ST segment change suggestive of ischemia. Comparison with Prior Nuclear Study: No previous nuclear study performed  Overall Impression:  Intermediate risk stress nuclear study with a small, mild, fixed apical defect consistent with thinning; no ischemia; intermediate risk based on reduced LV function.  LV Ejection Fraction: 38%.  LV Wall Motion:  Global  hypokinesis.  Olga Millers

## 2013-09-15 ENCOUNTER — Telehealth: Payer: Self-pay | Admitting: Family Medicine

## 2013-09-15 DIAGNOSIS — E119 Type 2 diabetes mellitus without complications: Secondary | ICD-10-CM

## 2013-09-15 NOTE — Telephone Encounter (Signed)
Pt request refill of glyBURIDE (DIABETA) 5 MG tablet And triamcinolone cream (KENALOG) 0.1 % [facility]/ [place]

## 2013-09-18 MED ORDER — GLYBURIDE 5 MG PO TABS: 5.0000 mg | ORAL_TABLET | Freq: Two times a day (BID) | ORAL | Status: DC

## 2013-09-18 MED ORDER — TRIAMCINOLONE ACETONIDE 0.1 % EX CREA: TOPICAL_CREAM | CUTANEOUS | Status: DC

## 2013-09-18 NOTE — Telephone Encounter (Signed)
RX sent to pharmacy  

## 2013-09-20 ENCOUNTER — Ambulatory Visit (INDEPENDENT_AMBULATORY_CARE_PROVIDER_SITE_OTHER): Payer: BC Managed Care – PPO | Admitting: Family Medicine

## 2013-09-20 ENCOUNTER — Telehealth: Payer: Self-pay | Admitting: Cardiology

## 2013-09-20 ENCOUNTER — Encounter: Payer: Self-pay | Admitting: Family Medicine

## 2013-09-20 VITALS — BP 132/80 | HR 104 | Temp 97.8°F | Wt 223.0 lb

## 2013-09-20 DIAGNOSIS — Z789 Other specified health status: Secondary | ICD-10-CM

## 2013-09-20 DIAGNOSIS — I519 Heart disease, unspecified: Secondary | ICD-10-CM

## 2013-09-20 DIAGNOSIS — E119 Type 2 diabetes mellitus without complications: Secondary | ICD-10-CM

## 2013-09-20 DIAGNOSIS — Z23 Encounter for immunization: Secondary | ICD-10-CM

## 2013-09-20 DIAGNOSIS — R39198 Other difficulties with micturition: Secondary | ICD-10-CM

## 2013-09-20 DIAGNOSIS — R4589 Other symptoms and signs involving emotional state: Secondary | ICD-10-CM

## 2013-09-20 DIAGNOSIS — R9439 Abnormal result of other cardiovascular function study: Secondary | ICD-10-CM

## 2013-09-20 DIAGNOSIS — F329 Major depressive disorder, single episode, unspecified: Secondary | ICD-10-CM

## 2013-09-20 DIAGNOSIS — I10 Essential (primary) hypertension: Secondary | ICD-10-CM

## 2013-09-20 LAB — POCT URINALYSIS DIPSTICK
Blood, UA: NEGATIVE
Leukocytes, UA: NEGATIVE
Protein, UA: NEGATIVE
Spec Grav, UA: 1.025
Urobilinogen, UA: 1
pH, UA: 5.5

## 2013-09-20 LAB — BASIC METABOLIC PANEL
BUN: 12 mg/dL (ref 6–23)
Chloride: 101 mEq/L (ref 96–112)
Creatinine, Ser: 0.9 mg/dL (ref 0.4–1.5)
GFR: 105.45 mL/min (ref >60.00)
Glucose, Bld: 245 mg/dL — ABNORMAL HIGH (ref 70–99)
Potassium: 4.3 mEq/L (ref 3.5–5.1)

## 2013-09-20 LAB — TSH: TSH: 0.33 u[IU]/mL — ABNORMAL LOW (ref 0.35–5.50)

## 2013-09-20 LAB — HEMOGLOBIN A1C: Hgb A1c MFr Bld: 9.8 % — ABNORMAL HIGH (ref 4.6–6.5)

## 2013-09-20 MED ORDER — TAMSULOSIN HCL 0.4 MG PO CAPS: 0.4000 mg | ORAL_CAPSULE | Freq: Every day | ORAL | Status: DC

## 2013-09-20 MED ORDER — SERTRALINE HCL 50 MG PO TABS: 50.0000 mg | ORAL_TABLET | Freq: Every day | ORAL | Status: DC
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
## 2013-09-20 NOTE — Telephone Encounter (Signed)
New problem:  Pt is requesting a call back with his stress test results.

## 2013-09-22 NOTE — Telephone Encounter (Signed)
Follow up    Stress test results.

## 2013-09-29 ENCOUNTER — Ambulatory Visit (HOSPITAL_COMMUNITY): Payer: BC Managed Care – PPO | Attending: Cardiology | Admitting: Radiology

## 2013-09-29 ENCOUNTER — Encounter: Payer: Self-pay | Admitting: Cardiology

## 2013-09-29 DIAGNOSIS — E785 Hyperlipidemia, unspecified: Secondary | ICD-10-CM | POA: Insufficient documentation

## 2013-09-29 DIAGNOSIS — Z8249 Family history of ischemic heart disease and other diseases of the circulatory system: Secondary | ICD-10-CM | POA: Insufficient documentation

## 2013-09-29 DIAGNOSIS — I10 Essential (primary) hypertension: Secondary | ICD-10-CM | POA: Insufficient documentation

## 2013-09-29 DIAGNOSIS — R9430 Abnormal result of cardiovascular function study, unspecified: Secondary | ICD-10-CM | POA: Insufficient documentation

## 2013-09-29 DIAGNOSIS — R0602 Shortness of breath: Secondary | ICD-10-CM | POA: Insufficient documentation

## 2013-09-29 DIAGNOSIS — R0609 Other forms of dyspnea: Secondary | ICD-10-CM | POA: Insufficient documentation

## 2013-09-29 DIAGNOSIS — R9439 Abnormal result of other cardiovascular function study: Secondary | ICD-10-CM

## 2013-09-29 DIAGNOSIS — R0989 Other specified symptoms and signs involving the circulatory and respiratory systems: Secondary | ICD-10-CM

## 2013-09-29 DIAGNOSIS — Z6833 Body mass index (BMI) 33.0-33.9, adult: Secondary | ICD-10-CM | POA: Insufficient documentation

## 2013-09-29 DIAGNOSIS — E119 Type 2 diabetes mellitus without complications: Secondary | ICD-10-CM | POA: Insufficient documentation

## 2013-09-29 DIAGNOSIS — E669 Obesity, unspecified: Secondary | ICD-10-CM | POA: Insufficient documentation

## 2013-09-29 NOTE — Progress Notes (Signed)
Echocardiogram performed by Matt LeBeau  

## 2013-10-11 ENCOUNTER — Ambulatory Visit (INDEPENDENT_AMBULATORY_CARE_PROVIDER_SITE_OTHER): Payer: BC Managed Care – PPO | Admitting: Family Medicine

## 2013-10-11 ENCOUNTER — Encounter: Payer: Self-pay | Admitting: Family Medicine

## 2013-10-11 VITALS — BP 128/78 | HR 104 | Temp 98.3°F | Wt 224.0 lb

## 2013-10-11 DIAGNOSIS — E119 Type 2 diabetes mellitus without complications: Secondary | ICD-10-CM

## 2013-10-11 DIAGNOSIS — R6889 Other general symptoms and signs: Secondary | ICD-10-CM

## 2013-10-11 DIAGNOSIS — R0600 Dyspnea, unspecified: Secondary | ICD-10-CM

## 2013-10-11 DIAGNOSIS — Z8659 Personal history of other mental and behavioral disorders: Secondary | ICD-10-CM

## 2013-10-11 DIAGNOSIS — Z79899 Other long term (current) drug therapy: Secondary | ICD-10-CM

## 2013-10-11 DIAGNOSIS — R7989 Other specified abnormal findings of blood chemistry: Secondary | ICD-10-CM

## 2013-10-11 DIAGNOSIS — N400 Enlarged prostate without lower urinary tract symptoms: Secondary | ICD-10-CM | POA: Insufficient documentation

## 2013-10-11 DIAGNOSIS — R0609 Other forms of dyspnea: Secondary | ICD-10-CM

## 2013-10-11 DIAGNOSIS — R0989 Other specified symptoms and signs involving the circulatory and respiratory systems: Secondary | ICD-10-CM

## 2013-10-11 LAB — COMPREHENSIVE METABOLIC PANEL
ALT: 37 U/L (ref 0–53)
AST: 20 U/L (ref 0–37)
Albumin: 4.1 g/dL (ref 3.5–5.2)
Alkaline Phosphatase: 87 U/L (ref 39–117)
BILIRUBIN TOTAL: 0.6 mg/dL (ref 0.3–1.2)
BUN: 10 mg/dL (ref 6–23)
CHLORIDE: 103 meq/L (ref 96–112)
CO2: 27 meq/L (ref 19–32)
CREATININE: 1 mg/dL (ref 0.4–1.5)
Calcium: 9.4 mg/dL (ref 8.4–10.5)
GFR: 100.43 mL/min (ref >60.00)
GLUCOSE: 271 mg/dL — AB (ref 70–99)
Potassium: 4.5 mEq/L (ref 3.5–5.1)
SODIUM: 138 meq/L (ref 135–145)
TOTAL PROTEIN: 6.7 g/dL (ref 6.0–8.3)

## 2013-10-11 LAB — CBC WITH DIFFERENTIAL/PLATELET
BASOS ABS: 0 10*3/uL (ref 0.0–0.1)
Basophils Relative: 0.2 % (ref 0.0–3.0)
EOS PCT: 0.6 % (ref 0.0–5.0)
Eosinophils Absolute: 0 10*3/uL (ref 0.0–0.7)
HCT: 41.7 % (ref 39.0–52.0)
Hemoglobin: 14.4 g/dL (ref 13.0–17.0)
LYMPHS PCT: 21 % (ref 12.0–46.0)
Lymphs Abs: 1.8 10*3/uL (ref 0.7–4.0)
MCHC: 34.6 g/dL (ref 30.0–36.0)
MCV: 81.7 fl (ref 78.0–100.0)
MONOS PCT: 7 % (ref 3.0–12.0)
Monocytes Absolute: 0.6 10*3/uL (ref 0.1–1.0)
NEUTROS ABS: 5.9 10*3/uL (ref 1.4–7.7)
Neutrophils Relative %: 71.2 % (ref 43.0–77.0)
Platelets: 326 10*3/uL (ref 150.0–400.0)
RBC: 5.11 Mil/uL (ref 4.22–5.81)
RDW: 12.9 % (ref 11.5–14.6)
WBC: 8.3 10*3/uL (ref 4.5–10.5)

## 2013-10-11 LAB — TSH: TSH: 0.71 u[IU]/mL (ref 0.35–5.50)

## 2013-10-11 LAB — T3, FREE: T3 FREE: 2.5 pg/mL (ref 2.3–4.2)

## 2013-10-11 LAB — T4, FREE: FREE T4: 0.77 ng/dL (ref 0.60–1.60)

## 2013-10-11 MED ORDER — SERTRALINE HCL 50 MG PO TABS: ORAL_TABLET | ORAL | Status: DC
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
## 2013-10-11 NOTE — Patient Instructions (Signed)
Continue with weight loss efforts and reduction in sugars and white starches. Increase Sertraline 50 to one and one half tablets daily.

## 2013-10-11 NOTE — Progress Notes (Signed)
Pre visit review using our clinic review tool, if applicable. No additional management support is needed unless otherwise documented below in the visit note. 

## 2013-10-12 ENCOUNTER — Telehealth: Payer: Self-pay

## 2013-10-12 NOTE — Telephone Encounter (Signed)
Relevant patient education assigned to patient using Emmi. ° °

## 2013-10-19 ENCOUNTER — Emergency Department (HOSPITAL_COMMUNITY): Payer: BC Managed Care – PPO

## 2013-10-19 ENCOUNTER — Encounter (HOSPITAL_COMMUNITY): Payer: Self-pay | Admitting: Emergency Medicine

## 2013-10-19 ENCOUNTER — Observation Stay (HOSPITAL_COMMUNITY)
Admission: EM | Admit: 2013-10-19 | Discharge: 2013-10-20 | Disposition: A | Discharge status: Home / Self Care | Payer: BC Managed Care – PPO | Attending: Internal Medicine | Admitting: Internal Medicine

## 2013-10-19 ENCOUNTER — Telehealth: Payer: Self-pay | Admitting: Family Medicine

## 2013-10-19 ENCOUNTER — Emergency Department (INDEPENDENT_AMBULATORY_CARE_PROVIDER_SITE_OTHER)
Admission: EM | Admit: 2013-10-19 | Discharge: 2013-10-19 | Disposition: A | Discharge status: Nursing Home (Includes ICF) | Payer: BC Managed Care – PPO | Source: Home / Self Care | Attending: Family Medicine | Admitting: Family Medicine

## 2013-10-19 DIAGNOSIS — R0602 Shortness of breath: Secondary | ICD-10-CM

## 2013-10-19 DIAGNOSIS — I5023 Acute on chronic systolic (congestive) heart failure: Principal | ICD-10-CM | POA: Insufficient documentation

## 2013-10-19 DIAGNOSIS — I209 Angina pectoris, unspecified: Secondary | ICD-10-CM | POA: Insufficient documentation

## 2013-10-19 DIAGNOSIS — IMO0002 Reserved for concepts with insufficient information to code with codable children: Secondary | ICD-10-CM

## 2013-10-19 DIAGNOSIS — E119 Type 2 diabetes mellitus without complications: Secondary | ICD-10-CM

## 2013-10-19 DIAGNOSIS — C8409 Mycosis fungoides, extranodal and solid organ sites: Secondary | ICD-10-CM

## 2013-10-19 DIAGNOSIS — I2089 Other forms of angina pectoris: Secondary | ICD-10-CM

## 2013-10-19 DIAGNOSIS — R0789 Other chest pain: Secondary | ICD-10-CM

## 2013-10-19 DIAGNOSIS — I10 Essential (primary) hypertension: Secondary | ICD-10-CM

## 2013-10-19 DIAGNOSIS — I509 Heart failure, unspecified: Secondary | ICD-10-CM | POA: Insufficient documentation

## 2013-10-19 DIAGNOSIS — I5022 Chronic systolic (congestive) heart failure: Secondary | ICD-10-CM

## 2013-10-19 DIAGNOSIS — E1165 Type 2 diabetes mellitus with hyperglycemia: Secondary | ICD-10-CM

## 2013-10-19 DIAGNOSIS — R079 Chest pain, unspecified: Secondary | ICD-10-CM

## 2013-10-19 DIAGNOSIS — Z7982 Long term (current) use of aspirin: Secondary | ICD-10-CM | POA: Insufficient documentation

## 2013-10-19 DIAGNOSIS — E785 Hyperlipidemia, unspecified: Secondary | ICD-10-CM

## 2013-10-19 DIAGNOSIS — N400 Enlarged prostate without lower urinary tract symptoms: Secondary | ICD-10-CM

## 2013-10-19 DIAGNOSIS — F3289 Other specified depressive episodes: Secondary | ICD-10-CM | POA: Insufficient documentation

## 2013-10-19 DIAGNOSIS — C8400 Mycosis fungoides, unspecified site: Secondary | ICD-10-CM | POA: Diagnosis present

## 2013-10-19 DIAGNOSIS — F329 Major depressive disorder, single episode, unspecified: Secondary | ICD-10-CM | POA: Insufficient documentation

## 2013-10-19 DIAGNOSIS — IMO0001 Reserved for inherently not codable concepts without codable children: Secondary | ICD-10-CM | POA: Insufficient documentation

## 2013-10-19 DIAGNOSIS — I2510 Atherosclerotic heart disease of native coronary artery without angina pectoris: Secondary | ICD-10-CM | POA: Insufficient documentation

## 2013-10-19 DIAGNOSIS — I2584 Coronary atherosclerosis due to calcified coronary lesion: Secondary | ICD-10-CM | POA: Insufficient documentation

## 2013-10-19 DIAGNOSIS — I208 Other forms of angina pectoris: Secondary | ICD-10-CM | POA: Diagnosis present

## 2013-10-19 DIAGNOSIS — R0600 Dyspnea, unspecified: Secondary | ICD-10-CM

## 2013-10-19 DIAGNOSIS — Z8659 Personal history of other mental and behavioral disorders: Secondary | ICD-10-CM

## 2013-10-19 LAB — CBC WITH DIFFERENTIAL/PLATELET
Basophils Absolute: 0 10*3/uL (ref 0.0–0.1)
Basophils Relative: 0 % (ref 0–1)
Eosinophils Absolute: 0.1 10*3/uL (ref 0.0–0.7)
Eosinophils Relative: 1 % (ref 0–5)
HCT: 41.9 % (ref 39.0–52.0)
Hemoglobin: 14.6 g/dL (ref 13.0–17.0)
LYMPHS ABS: 2.1 10*3/uL (ref 0.7–4.0)
LYMPHS PCT: 26 % (ref 12–46)
MCH: 28.9 pg (ref 26.0–34.0)
MCHC: 34.8 g/dL (ref 30.0–36.0)
MCV: 83 fL (ref 78.0–100.0)
Monocytes Absolute: 0.6 10*3/uL (ref 0.1–1.0)
Monocytes Relative: 7 % (ref 3–12)
NEUTROS PCT: 66 % (ref 43–77)
Neutro Abs: 5.3 10*3/uL (ref 1.7–7.7)
Platelets: 232 10*3/uL (ref 150–400)
RBC: 5.05 MIL/uL (ref 4.22–5.81)
RDW: 13.1 % (ref 11.5–15.5)
WBC: 8 10*3/uL (ref 4.0–10.5)

## 2013-10-19 LAB — COMPREHENSIVE METABOLIC PANEL
ALBUMIN: 3.5 g/dL (ref 3.5–5.2)
ALT: 20 U/L (ref 0–53)
AST: 12 U/L (ref 0–37)
Alkaline Phosphatase: 104 U/L (ref 39–117)
BUN: 11 mg/dL (ref 6–23)
CO2: 23 mEq/L (ref 19–32)
CREATININE: 0.8 mg/dL (ref 0.50–1.35)
Calcium: 8.7 mg/dL (ref 8.4–10.5)
Chloride: 103 mEq/L (ref 96–112)
GFR calc Af Amer: >90 mL/min (ref >90)
Glucose, Bld: 227 mg/dL — ABNORMAL HIGH (ref 70–99)
Potassium: 4.6 mEq/L (ref 3.7–5.3)
Sodium: 140 mEq/L (ref 137–147)
Total Bilirubin: <0.2 mg/dL — ABNORMAL LOW (ref 0.3–1.2)
Total Protein: 6.3 g/dL (ref 6.0–8.3)

## 2013-10-19 LAB — PRO B NATRIURETIC PEPTIDE: Pro B Natriuretic peptide (BNP): 18.3 pg/mL (ref 0–125)

## 2013-10-19 LAB — GLUCOSE, CAPILLARY: GLUCOSE-CAPILLARY: 188 mg/dL — AB (ref 70–99)

## 2013-10-19 LAB — D-DIMER, QUANTITATIVE (NOT AT ARMC): D DIMER QUANT: 0.96 ug{FEU}/mL — AB (ref 0.00–0.48)

## 2013-10-19 LAB — TROPONIN I: Troponin I: <0.3 ng/mL (ref <0.30)

## 2013-10-19 MED ORDER — GABAPENTIN 300 MG PO CAPS
300.0000 mg | ORAL_CAPSULE | Freq: Every day | ORAL | Status: DC
  Administered 2013-10-20: 300 mg via ORAL
  Filled 2013-10-19: qty 1

## 2013-10-19 MED ORDER — CYCLOBENZAPRINE HCL 10 MG PO TABS
10.0000 mg | ORAL_TABLET | Freq: Three times a day (TID) | ORAL | Status: DC | PRN
  Filled 2013-10-19: qty 1

## 2013-10-19 MED ORDER — ONDANSETRON HCL 4 MG/2ML IJ SOLN: 4.0000 mg | Freq: Four times a day (QID) | INTRAMUSCULAR | Status: DC | PRN

## 2013-10-19 MED ORDER — EZETIMIBE 10 MG PO TABS
10.0000 mg | ORAL_TABLET | Freq: Every day | ORAL | Status: DC
  Administered 2013-10-20: 10 mg via ORAL
  Filled 2013-10-19: qty 1

## 2013-10-19 MED ORDER — TAMSULOSIN HCL 0.4 MG PO CAPS
0.4000 mg | ORAL_CAPSULE | Freq: Every day | ORAL | Status: DC
  Administered 2013-10-20: 0.4 mg via ORAL
  Filled 2013-10-19: qty 1

## 2013-10-19 MED ORDER — LOSARTAN POTASSIUM 50 MG PO TABS
50.0000 mg | ORAL_TABLET | Freq: Every day | ORAL | Status: DC
  Administered 2013-10-20: 50 mg via ORAL
  Filled 2013-10-19: qty 1

## 2013-10-19 MED ORDER — INSULIN ASPART 100 UNIT/ML ~~LOC~~ SOLN
0.0000 [IU] | Freq: Three times a day (TID) | SUBCUTANEOUS | Status: DC
  Administered 2013-10-20: 5 [IU] via SUBCUTANEOUS

## 2013-10-19 MED ORDER — SODIUM CHLORIDE 0.9 % IJ SOLN: 3.0000 mL | Freq: Two times a day (BID) | INTRAMUSCULAR | Status: DC

## 2013-10-19 MED ORDER — SERTRALINE HCL 50 MG PO TABS
75.0000 mg | ORAL_TABLET | Freq: Every day | ORAL | Status: DC
  Administered 2013-10-20: 75 mg via ORAL
  Filled 2013-10-19: qty 1

## 2013-10-19 MED ORDER — ONDANSETRON HCL 4 MG PO TABS: 4.0000 mg | ORAL_TABLET | Freq: Four times a day (QID) | ORAL | Status: DC | PRN

## 2013-10-19 MED ORDER — ENOXAPARIN SODIUM 40 MG/0.4ML ~~LOC~~ SOLN
40.0000 mg | SUBCUTANEOUS | Status: DC
  Filled 2013-10-19: qty 0.4

## 2013-10-19 MED ORDER — ATORVASTATIN CALCIUM 40 MG PO TABS
40.0000 mg | ORAL_TABLET | Freq: Every day | ORAL | Status: DC
  Administered 2013-10-20: 40 mg via ORAL
  Filled 2013-10-19: qty 1

## 2013-10-19 MED ORDER — ONDANSETRON HCL 4 MG/2ML IJ SOLN
4.0000 mg | Freq: Once | INTRAMUSCULAR | Status: AC
  Administered 2013-10-19: 4 mg via INTRAVENOUS

## 2013-10-19 MED ORDER — ACETAMINOPHEN 325 MG PO TABS: 650.0000 mg | ORAL_TABLET | Freq: Four times a day (QID) | ORAL | Status: DC | PRN

## 2013-10-19 MED ORDER — IOHEXOL 350 MG/ML SOLN
100.0000 mL | Freq: Once | INTRAVENOUS | Status: AC | PRN
  Administered 2013-10-19: 100 mL via INTRAVENOUS

## 2013-10-19 MED ORDER — ACETAMINOPHEN 650 MG RE SUPP: 650.0000 mg | Freq: Four times a day (QID) | RECTAL | Status: DC | PRN

## 2013-10-19 MED ORDER — HYDROXYZINE HCL 10 MG PO TABS
10.0000 mg | ORAL_TABLET | Freq: Three times a day (TID) | ORAL | Status: DC | PRN
  Filled 2013-10-19: qty 1

## 2013-10-19 MED ORDER — ASPIRIN EC 325 MG PO TBEC
325.0000 mg | DELAYED_RELEASE_TABLET | Freq: Every day | ORAL | Status: DC
  Administered 2013-10-20: 325 mg via ORAL
  Filled 2013-10-19: qty 1

## 2013-10-19 MED ORDER — SODIUM CHLORIDE 0.9 % IV SOLN
INTRAVENOUS | Status: DC
  Administered 2013-10-19: 19:00:00 via INTRAVENOUS

## 2013-10-19 MED ORDER — SODIUM CHLORIDE 0.9 % IV SOLN
Freq: Once | INTRAVENOUS | Status: AC
  Administered 2013-10-19: 16:00:00 via INTRAVENOUS

## 2013-10-19 NOTE — Telephone Encounter (Signed)
Pt went to ED notes are in his chart
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
## 2013-10-19 NOTE — ED Notes (Signed)
Pt c/o intermittent SOB and dizziness onset yest that will last for 10 minutes Denies: CP, weakness, diaphoresis, f/v/n/d, cold sxs Hx of skin cancer... Alert w/no signs of acute distress.

## 2013-10-19 NOTE — ED Notes (Signed)
According to the patient, he responded to Urgent Care due to his SOB getting worse and was accompanied by chest pain.  The patient says he has been having this chest pain for three months however, the chest pain is what made him go to Urgent care to be evaluated.  Urgent Care sent him here by [facility].  Urgent Care personnel placed a #20 PIV in his right hand.
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
## 2013-10-19 NOTE — Telephone Encounter (Signed)
Patient Information:  Caller Name: Stephens  Phone: [phone]  Patient: Eric, Price  Gender: Male  DOB: [birth year]  Age: 63 Years  PCP: Carolann Littler [facility])  Office Follow Up:  Does the office need to follow up with this patient?: No  Instructions For The Office: N/A  RN Note:  Substernal cardiac "pounding" with mild nausea.  Reports pulse > 100 bpm. Denies chest discomfort or radiation of pain. Light facial diaphoresis present.  Reluctant to call 911 due to cost of services.  Advised office unable to handle his emergent situation; needs immediate medical evaluation; care won't be denied based on ability to pay.  Agreed to call 911.  Symptoms  Reason For Call & Symptoms: Emergent Call: Pounding in chest with shortness of breath with movement. FBS 223 at 1130.  Reviewed Health History In EMR: Yes  Reviewed Medications In EMR: Yes  Reviewed Allergies In EMR: Yes  Reviewed Surgeries / Procedures: Yes  Date of Onset of Symptoms: 10/19/2013  Guideline(s) Used:  Heart Rate and Heartbeat Questions  Disposition Per Guideline:   Call EMS 911 Now  Reason For Disposition Reached:   Visible sweat on face or sweat dripping down face  Advice Given:  N/A  Patient Will Follow Care Advice:  YES
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
## 2013-10-20 ENCOUNTER — Encounter (HOSPITAL_COMMUNITY)
Admission: EM | Disposition: A | Discharge status: Home / Self Care | Payer: Self-pay | Source: Home / Self Care | Attending: Emergency Medicine

## 2013-10-20 DIAGNOSIS — I2510 Atherosclerotic heart disease of native coronary artery without angina pectoris: Secondary | ICD-10-CM

## 2013-10-20 DIAGNOSIS — N400 Enlarged prostate without lower urinary tract symptoms: Secondary | ICD-10-CM

## 2013-10-20 DIAGNOSIS — E1165 Type 2 diabetes mellitus with hyperglycemia: Secondary | ICD-10-CM

## 2013-10-20 DIAGNOSIS — IMO0001 Reserved for inherently not codable concepts without codable children: Secondary | ICD-10-CM

## 2013-10-20 DIAGNOSIS — R079 Chest pain, unspecified: Secondary | ICD-10-CM | POA: Diagnosis present

## 2013-10-20 DIAGNOSIS — R0789 Other chest pain: Secondary | ICD-10-CM

## 2013-10-20 DIAGNOSIS — R0602 Shortness of breath: Secondary | ICD-10-CM

## 2013-10-20 DIAGNOSIS — I509 Heart failure, unspecified: Secondary | ICD-10-CM

## 2013-10-20 DIAGNOSIS — I5022 Chronic systolic (congestive) heart failure: Secondary | ICD-10-CM

## 2013-10-20 HISTORY — PX: LEFT AND RIGHT HEART CATHETERIZATION WITH CORONARY ANGIOGRAM: SHX5449

## 2013-10-20 LAB — GLUCOSE, CAPILLARY
GLUCOSE-CAPILLARY: 207 mg/dL — AB (ref 70–99)
GLUCOSE-CAPILLARY: 170 mg/dL — AB (ref 70–99)
GLUCOSE-CAPILLARY: 182 mg/dL — AB (ref 70–99)
Glucose-Capillary: 169 mg/dL — ABNORMAL HIGH (ref 70–99)

## 2013-10-20 LAB — POCT I-STAT 3, ART BLOOD GAS (G3+)
Acid-base deficit: 1 mmol/L (ref 0.0–2.0)
BICARBONATE: 25.7 meq/L — AB (ref 20.0–24.0)
O2 Saturation: 97 %
PH ART: 7.321 — AB (ref 7.350–7.450)
PO2 ART: 101 mmHg — AB (ref 80.0–100.0)
TCO2: 27 mmol/L (ref 0–100)
pCO2 arterial: 49.7 mmHg — ABNORMAL HIGH (ref 35.0–45.0)

## 2013-10-20 LAB — LIPID PANEL
CHOLESTEROL: 136 mg/dL (ref 0–200)
HDL: 21 mg/dL — ABNORMAL LOW (ref >39)
LDL Cholesterol: 70 mg/dL (ref 0–99)
Total CHOL/HDL Ratio: 6.5 RATIO
Triglycerides: 223 mg/dL — ABNORMAL HIGH (ref <150)
VLDL: 45 mg/dL — ABNORMAL HIGH (ref 0–40)

## 2013-10-20 LAB — CBC
HCT: 41.7 % (ref 39.0–52.0)
Hemoglobin: 14.6 g/dL (ref 13.0–17.0)
MCH: 28.6 pg (ref 26.0–34.0)
MCHC: 35 g/dL (ref 30.0–36.0)
MCV: 81.6 fL (ref 78.0–100.0)
PLATELETS: 241 10*3/uL (ref 150–400)
RBC: 5.11 MIL/uL (ref 4.22–5.81)
RDW: 13.1 % (ref 11.5–15.5)
WBC: 8 10*3/uL (ref 4.0–10.5)

## 2013-10-20 LAB — POCT I-STAT 3, VENOUS BLOOD GAS (G3P V)
Acid-base deficit: 2 mmol/L (ref 0.0–2.0)
Bicarbonate: 24.9 mEq/L — ABNORMAL HIGH (ref 20.0–24.0)
O2 Saturation: 73 %
PCO2 VEN: 51.9 mmHg — AB (ref 45.0–50.0)
TCO2: 26 mmol/L (ref 0–100)
pH, Ven: 7.29 (ref 7.250–7.300)
pO2, Ven: 44 mmHg (ref 30.0–45.0)

## 2013-10-20 LAB — BASIC METABOLIC PANEL
BUN: 11 mg/dL (ref 6–23)
CALCIUM: 8.3 mg/dL — AB (ref 8.4–10.5)
CO2: 24 mEq/L (ref 19–32)
CREATININE: 0.8 mg/dL (ref 0.50–1.35)
Chloride: 101 mEq/L (ref 96–112)
Glucose, Bld: 269 mg/dL — ABNORMAL HIGH (ref 70–99)
Potassium: 4.2 mEq/L (ref 3.7–5.3)
Sodium: 139 mEq/L (ref 137–147)

## 2013-10-20 LAB — TROPONIN I

## 2013-10-20 LAB — PROTIME-INR
INR: 1.02 (ref 0.00–1.49)
Prothrombin Time: 13.2 seconds (ref 11.6–15.2)

## 2013-10-20 SURGERY — LEFT AND RIGHT HEART CATHETERIZATION WITH CORONARY ANGIOGRAM
Anesthesia: LOCAL

## 2013-10-20 MED ORDER — SODIUM CHLORIDE 0.9 % IJ SOLN
3.0000 mL | Freq: Two times a day (BID) | INTRAMUSCULAR | Status: DC
  Administered 2013-10-20: 3 mL via INTRAVENOUS

## 2013-10-20 MED ORDER — SODIUM CHLORIDE 0.9 % IV SOLN
INTRAVENOUS | Status: DC
  Administered 2013-10-20: 10:00:00 via INTRAVENOUS

## 2013-10-20 MED ORDER — MIDAZOLAM HCL 2 MG/2ML IJ SOLN
INTRAMUSCULAR | Status: AC
  Filled 2013-10-20: qty 2

## 2013-10-20 MED ORDER — SODIUM CHLORIDE 0.9 % IV SOLN: 250.0000 mL | INTRAVENOUS | Status: DC | PRN

## 2013-10-20 MED ORDER — FENTANYL CITRATE 0.05 MG/ML IJ SOLN
INTRAMUSCULAR | Status: AC
  Filled 2013-10-20: qty 2

## 2013-10-20 MED ORDER — SODIUM CHLORIDE 0.9 % IJ SOLN: 3.0000 mL | INTRAMUSCULAR | Status: DC | PRN

## 2013-10-20 MED ORDER — NITROGLYCERIN 0.2 MG/ML ON CALL CATH LAB
INTRAVENOUS | Status: AC
  Filled 2013-10-20: qty 1

## 2013-10-20 MED ORDER — ASPIRIN 81 MG PO CHEW: 81.0000 mg | CHEWABLE_TABLET | ORAL | Status: DC

## 2013-10-20 MED ORDER — CARVEDILOL 3.125 MG PO TABS: 3.1250 mg | ORAL_TABLET | Freq: Two times a day (BID) | ORAL | Status: DC

## 2013-10-20 MED ORDER — CARVEDILOL 3.125 MG PO TABS
3.1250 mg | ORAL_TABLET | Freq: Two times a day (BID) | ORAL | Status: DC
  Filled 2013-10-20 (×2): qty 1

## 2013-10-20 MED ORDER — HEPARIN (PORCINE) IN NACL 2-0.9 UNIT/ML-% IJ SOLN
INTRAMUSCULAR | Status: AC
  Filled 2013-10-20: qty 1000

## 2013-10-20 MED ORDER — SODIUM CHLORIDE 0.9 % IV SOLN: 1.0000 mL/kg/h | INTRAVENOUS | Status: DC

## 2013-10-20 MED ORDER — NITROGLYCERIN 0.4 MG SL SUBL: 0.4000 mg | SUBLINGUAL_TABLET | SUBLINGUAL | Status: DC | PRN

## 2013-10-20 MED ORDER — SODIUM CHLORIDE 0.9 % IJ SOLN: 3.0000 mL | Freq: Two times a day (BID) | INTRAMUSCULAR | Status: DC

## 2013-10-20 MED ORDER — LIDOCAINE HCL (PF) 1 % IJ SOLN
INTRAMUSCULAR | Status: AC
  Filled 2013-10-20: qty 30

## 2013-10-20 MED ORDER — METFORMIN HCL 500 MG PO TABS: 1000.0000 mg | ORAL_TABLET | Freq: Two times a day (BID) | ORAL | Status: DC
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
## 2013-10-20 NOTE — Progress Notes (Signed)
Pt ambulated for 20 minutes and right groin WNL. D/c'd home with wife via wheelchair

## 2013-10-20 NOTE — CV Procedure (Signed)
   Cardiac Catheterization Procedure Note  Name: Eric Price MRN: [id number] DOB: 01/14/1951  Procedure: Right Heart Cath, Left Heart Cath, Selective Coronary Angiography, LV angiography  Indication:  Unstable angina, congestive heart failure, presents of diabetes.  Procedural Details: The right groin was prepped, draped, and anesthetized with 1% lidocaine. Using the modified Seldinger technique a 5 French sheath was placed in the right femoral artery and a 7 French sheath was placed in the right femoral vein. A Swan-Ganz catheter was used for the right heart catheterization. Standard protocol was followed for recording of right heart pressures and sampling of oxygen saturations. Fick cardiac output was calculated. Standard Judkins catheters were used for selective coronary angiography and left ventriculography. A JL 5 catheter was required for the left coronary artery. There were no immediate procedural complications. The patient was transferred to the post catheterization recovery area for further monitoring.  Procedural Findings: Hemodynamics RA 6 RV 28/70 PA 27/6 with a mean of 19 PCWP 7 LV 124/11 AO 127/85 with a mean of 103  Oxygen saturations: PA 73 AO 97  Cardiac Output (Fick) 6.0  Cardiac Index (Fick) 2.8   Coronary angiography: Coronary dominance: right  Left mainstem: Widely patent with no obstructive disease. Arises from the left cusp. Trifurcates into the LAD, intermediate branch, and left circumflex.  Left anterior descending (LAD): The vessel is moderately calcified. There is no significant obstruction to the proximal LAD. The mid vessel has 30-40% stenosis. The distal vessel is patent. The LAD wraps around the left ventricular apex. The diagonal branch is patent without significant stenosis.  Left circumflex (LCx): The intermediate branch has no significant disease. The AV circumflex supplies a single OM branch that divides into twin vessels. There is no significant  stenosis noted.  Right coronary artery (RCA): The right coronary artery is dominant. The vessel is medium in caliber. There is mild irregularity in the proximal and mid vessel. The distal vessel has 50% stenosis before it divides into the PDA and PLA branches. The PDA and PLA are not significantly disease.  Left ventriculography: There is mild global hypokinesis of the left ventricle. The LVEF is estimated at 45%.   Final Conclusions:   1. Nonobstructive coronary artery disease with minimal disease involving the left main, LAD, and left circumflex vessels. There is moderate stenosis of the distal RCA.  2. Normal intracardiac hemodynamics  3. Mild global left ventricular systolic dysfunction.  Recommendations: Medical therapy, weight loss, treatment of congestive heart failure. Not significantly intracardiac volume or pressure overloaded based on hemodynamic assessment.   [name] 10/20/2013, 12:00 PM

## 2013-10-20 NOTE — Discharge Summary (Signed)
Physician Discharge Summary  Patient ID: Eric Price MRN: [id number] DOB/AGE: 1951/09/26 63 y.o.  Admit date: 10/19/2013 Discharge date: 10/20/2013  Primary Care Physician:  [name], MD  Discharge Diagnoses:     Chest pain  . Stable angina . Hyperlipidemia . Hypertension . Mycosis fungoides Acute systolic CHF, EF 37% by cardiac cath  Consults:  Cardiology: Dr. Acie Fredrickson   Recommendations for Outpatient Follow-up:  Cardiac cath showed nonobstructive coronary artery disease, recommended medical therapy, weight loss and treatment of CHF  Allergies:   Allergies  Allergen Reactions  . Morphine Shortness Of Breath and Anaphylaxis  . Morphine And Related     "took my breath away"     Discharge Medications:   Medication List         aspirin 81 MG tablet  Take 81 mg by mouth daily.     atorvastatin 40 MG tablet  Commonly known as:  LIPITOR  Take 1 tablet (40 mg total) by mouth daily.     carvedilol 3.125 MG tablet  Commonly known as:  COREG  Take 1 tablet (3.125 mg total) by mouth 2 (two) times daily with a meal.     cyclobenzaprine 10 MG tablet  Commonly known as:  FLEXERIL  Take 1 tablet (10 mg total) by mouth 3 (three) times daily as needed for muscle spasms.     ezetimibe 10 MG tablet  Commonly known as:  ZETIA  Take 1 tablet (10 mg total) by mouth daily.     gabapentin 300 MG capsule  Commonly known as:  NEURONTIN  Take 300 mg by mouth daily. Per Devonne Doughty, MD orthopedics     glyBURIDE 5 MG tablet  Commonly known as:  DIABETA  Take 1 tablet (5 mg total) by mouth 2 (two) times daily with a meal.     hydrOXYzine 10 MG tablet  Commonly known as:  ATARAX/VISTARIL  Take 10 mg by mouth 3 (three) times daily as needed.     interferon alfa-2b 6000000 UNIT/ML injection  Commonly known as:  INTRON-A  Inject 0.5 cc (3 Mu) three times a week subcutaneously on sun,tues, & thursdays     losartan 50 MG tablet  Commonly known as:  COZAAR  Take 1 tablet  (50 mg total) by mouth daily.     metFORMIN 500 MG tablet  Commonly known as:  GLUCOPHAGE  Take 2 tablets (1,000 mg total) by mouth 2 (two) times daily with a meal.  Start taking on:  10/21/2013     mupirocin cream 2 %  Commonly known as:  BACTROBAN  Apply 1 application topically 3 (three) times daily.     nitroGLYCERIN 0.4 MG SL tablet  Commonly known as:  NITROSTAT  Place 1 tablet (0.4 mg total) under the tongue every 5 (five) minutes as needed for chest pain.     sertraline 50 MG tablet  Commonly known as:  ZOLOFT  Take one and one half tablet daily     tamsulosin 0.4 MG Caps capsule  Commonly known as:  FLOMAX  Take 1 capsule (0.4 mg total) by mouth daily.     triamcinolone cream 0.1 %  Commonly known as:  KENALOG  Apply 1 application topically 2 (two) times daily.         Brief H and P: For complete details please refer to admission H and P, but in brief 63 year old male with a history of diabetes mellitus, hyperlipidemia, hypertension, and mycosis fungoides presents with 2-3 month history of chest discomfort  with exertion. The patient states that it has become more frequent. On the day of admission, the patient experienced chest pressure radiating to the neck with exertion, with some improvement at rest. Normally, the patient states that his chest discomfort goes away after approximately 10 minutes of rest. However, he was concerned because it took longer to go away at rest. This was associated with some shortness of breath, diaphoresis, and nausea and lightheadedness. There was no syncope. The patient had a nuclear medicine stress test on 09/05/2013 which showed EF 38%, global hypokinesis, a fixed apical defect. He endorsed compliance with his home medications including aspirin 81 mg daily. Echocardiogram on 09/29/2013 showed EF 45%, diffuse hypokinesis.  In the ED, BMP was unremarkable. CBC was unremarkable. EKG was sinus rhythm without significant ST-T wave changes. CT  angiogram of the chest was negative for pulmonary embolus. ProBNP was 18.3. Chest x-ray was negative.   Hospital Course:  Patient is a 63 year old male with past medical history of mild systolic CHF, diabetes, hypertension, hyperlipidemia who was admitted for chest pressure and dyspnea. Patient had reported the symptoms been present for months and worse over the last couple of weeks.   angina with chest pressure: Patient has satisfactory of hypertension, hyperlipidemia, diabetes hence was admitted for further workup. BMET and CBC was unremarkable. EKG showed sinus rhythm without any significant ST-T wave changes suggestive of ischemia. CT angiogram of the chest was done and was negative for pulmonary embolism however it did show coronary calcification in all 3 coronary arteries. Patient recently had outpatient cardiac workup including nuclear medicine stress test and 2-D echocardiogram in December 2004 in which had shown depressed EF. Cardiology was consulted and patient was seen by Dr. Acie Fredrickson, who recommended cardiac catheterization. Cardiac cath showed nonobstructive coronary artery sees, moderate stenosis of the distal RCA, EF 45%. Patient was recommended weight loss, medical therapy and treatment of CHF. Patient was not significantly volume overloaded. Patient was placed on aspirin, Coreg, losartan and statin.   Mycosis fungoides  -Patient takes interferon, 3MU on Sun-Tues-Thur, follows oncology at [facility]   Diabetes mellitus type 2, uncontrolled  -09/20/2013 hemoglobin A1c 9.8 oral hypoglycemic agents were held during hospitalization. The patient should follow up with PCP and discuss about uncontrolled diabetes. He may need insulin, will defer to PCP.    Hyperlipidemia  -Continue Lipitor and Zetia   Depression  -Continue Zoloft   Hypertension  -Continue losartan   Day of Discharge BP 135/93  Pulse 67  Temp(Src) 98.6 F (37 C) (Oral)  Resp 18  Ht 5\' 9"  (1.753 m)  Wt 102.241 kg (225 lb  6.4 oz)  BMI 33.27 kg/m2  SpO2 97%  Physical Exam: General: Alert and awake oriented x3 not in any acute distress. HEENT: anicteric sclera, pupils reactive to light and accommodation CVS: S1-S2 clear no murmur rubs or gallops Chest: clear to auscultation bilaterally, no wheezing rales or rhonchi Abdomen: soft nontender, nondistended, normal bowel sounds Extremities: no cyanosis, clubbing or edema noted bilaterally Neuro: Cranial nerves II-XII intact, no focal neurological deficits   The results of significant diagnostics from this hospitalization (including imaging, microbiology, ancillary and laboratory) are listed below for reference.    LAB RESULTS: Basic Metabolic Panel:  Recent Labs Lab 10/19/13 1707 10/20/13 0457  NA 140 139  K 4.6 4.2  CL 103 101  CO2 23 24  GLUCOSE 227* 269*  BUN 11 11  CREATININE 0.80 0.80  CALCIUM 8.7 8.3*   Liver Function Tests:  Recent Labs Lab  10/19/13 1707  AST 12  ALT 20  ALKPHOS 104  BILITOT <0.2*  PROT 6.3  ALBUMIN 3.5   No results found for this basename: LIPASE, AMYLASE,  in the last 168 hours No results found for this basename: AMMONIA,  in the last 168 hours CBC:  Recent Labs Lab 10/19/13 1707 10/20/13 0457  WBC 8.0 8.0  NEUTROABS 5.3  --   HGB 14.6 14.6  HCT 41.9 41.7  MCV 83.0 81.6  PLT 232 241   Cardiac Enzymes:  Recent Labs Lab 10/19/13 2335 10/20/13 0457  TROPONINI <0.30 <0.30   BNP: No components found with this basename: POCBNP,  CBG:  Recent Labs Lab 10/19/13 2317 10/20/13 0746  GLUCAP 188* 207*    Significant Diagnostic Studies:  Dg Chest 2 View  10/19/2013   CLINICAL DATA:  Shortness of breath.  EXAM: CHEST  2 VIEW  COMPARISON:  None.  FINDINGS: Low lung volumes. The heart size and mediastinal contours are within normal limits. Both lungs are clear. The visualized skeletal structures are unremarkable.  IMPRESSION: No active cardiopulmonary disease.   Electronically Signed   By: Margaree Mackintosh M.D.   On: 10/19/2013 18:23   Ct Angio Chest Pe W/cm &/or Wo Cm  10/19/2013   CLINICAL DATA:  Substernal chest pain and shortness of breath over the past 1 week. Current history of hypertension, hyperlipidemia, and diabetes. Prior history of T-cell lymphoma.  EXAM: CT ANGIOGRAPHY CHEST WITH CONTRAST  TECHNIQUE: Multidetector CT imaging of the chest was performed using the standard protocol during bolus administration of intravenous contrast. Multiplanar CT image reconstructions and MIPs were obtained to evaluate the vascular anatomy.  CONTRAST:  183mL OMNIPAQUE IOHEXOL 350 MG/ML IV.  COMPARISON:  CT chest 11/25/2012.  FINDINGS: Contrast opacification of the pulmonary arteries is excellent. No filling defects within either main pulmonary artery or their branches in either lung to suggest pulmonary embolism. Heart enlarged with left ventricular enlargement and left ventricular hypertrophy. Moderate to severe 3 vessel coronary atherosclerosis. Small pericardial effusion. Mild atherosclerosis involving the thoracic and upper abdominal aorta without aneurysm or dissection.  Pulmonary parenchyma clear apart from the expected dependent atelectasis posteriorly in the lower lobes. No localized airspace consolidation. No evidence of interstitial lung disease. No pleural effusions. Central airways patent with marked bronchial wall thickening.  Scattered normal sized mediastinal, hilar, and axillary lymph nodes; no significant lymphadenopathy and no interval change. Mild enlargement of the thyroid gland without nodularity, unchanged.  Areas of hepatic steatosis throughout much of the visualized liver, with focal sparing in the gallbladder fossa region and in the posterior segment right lobe. Normal size aortocaval and porta hepatis lymph nodes, unchanged. Visualized upper abdomen otherwise unremarkable. Foci of accessory splenic tissue medial to the spleen below the hilum, as noted previously. Bone window images  demonstrate lower thoracic spondylosis.  Review of the MIP images confirms the above findings.  IMPRESSION: 1. No evidence of pulmonary embolism. 2. No acute cardiopulmonary disease. 3. Cardiomegaly with left ventricular enlargement and left ventricular hypertrophy. Moderate to severe 3 vessel coronary atherosclerosis. 4. Small pericardial effusion. 5. No evidence of lymphadenopathy in the chest or upper abdomen to suggest recurrent lymphoma. 6. Areas of hepatic steatosis throughout much of the visualized liver with focal sparing in the gallbladder fossa region and in the posterior segment right lobe.   Electronically Signed   By: Evangeline Dakin M.D.   On: 10/19/2013 20:40       Disposition and Follow-up: Discharge Orders   Future  Appointments Provider Department Dept Phone   12/11/2013 10:30 AM [name], MD [facility] [place]   Future Orders Complete By Expires   Diet Carb Modified  As directed    Increase activity slowly  As directed        DISPOSITION: Home DIET: Carb modified  DISCHARGE FOLLOW-UP Follow-up Information   Follow up with [name], MD. Schedule an appointment as soon as possible for a visit in 10 days. (for hospital follow-up)    Specialty:  Family Medicine   Contact information:   [place] [address] [phone]       Follow up with Darden Amber., MD. Schedule an appointment as soon as possible for a visit in 2 weeks. (for follow-up)    Specialty:  Cardiology   Contact information:   [address] [place] [address] [phone]       Time spent on Discharge: 45 mins  Signed:   RAI,RIPUDEEP M.D. [facility] 10/20/2013, 1:31 PM Pager: [phone]

## 2013-10-20 NOTE — Interval H&P Note (Signed)
History and Physical Interval Note:  10/20/2013 11:09 AM  Eric Price  has presented today for surgery, with the diagnosis of hf  The various methods of treatment have been discussed with the patient and family. After consideration of risks, benefits and other options for treatment, the patient has consented to  Procedure(s): LEFT AND RIGHT HEART CATHETERIZATION WITH CORONARY ANGIOGRAM (N/A) as a surgical intervention .  The patient's history has been reviewed, patient examined, no change in status, stable for surgery.  I have reviewed the patient's chart and labs.  Questions were answered to the patient's satisfaction.    Cath Lab Visit (complete for each Cath Lab visit)  Clinical Evaluation Leading to the Procedure:   ACS: yes  Non-ACS:    Anginal Classification: CCS IV  Anti-ischemic medical therapy: No Therapy  Non-Invasive Test Results: No non-invasive testing performed  Prior CABG: No previous CABG  Reviewed history with patient. His sx's are occurring in a crescendo pattern of USAP. He now has chest pressure and dyspnea with minimal activity. Enzymes negative. Imaging reveals LVH and global LV dysfunction. Plan right/left heart cath, possible PCI.  [name]

## 2013-10-20 NOTE — Progress Notes (Signed)
Inpatient Diabetes Program Recommendations  AACE/ADA: New Consensus Statement on Inpatient Glycemic Control (2013)  Target Ranges:  Prepandial:   less than 140 mg/dL      Peak postprandial:   less than 180 mg/dL (1-2 hours)      Critically ill patients:  140 - 180 mg/dL   Reason for Visit: Results for KHAI, ARRONA (MRN [id number]) as of 10/20/2013 11:28  Ref. Range 10/19/2013 23:17 10/20/2013 07:46  Glucose-Capillary Latest Range: 70-99 mg/dL 188 (H) 207 (H)  Results for ZAEDEN, LASTINGER (MRN [id number]) as of 10/20/2013 11:28  Ref. Range 09/20/2013 13:01  Hemoglobin A1C Latest Range: 4.6-6.5 % 9.8 (H)   Please add basal insulin, Levemir 20 units daily while in the hospital.   Will follow. Thanks, Adah Perl, RN, BC-ADM Inpatient Diabetes Coordinator Pager [phone]

## 2013-10-20 NOTE — ED Provider Notes (Signed)
Medical screening examination/treatment/procedure(s) were performed by a resident physician or non-physician practitioner and as the supervising physician I was immediately available for consultation/collaboration.  Lynne Leader, MD    Gregor Hams, MD 10/20/13 [phone]

## 2013-10-20 NOTE — Progress Notes (Signed)
UR Completed Taren Dymek Graves-Bigelow, RN,BSN [phone]  
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
## 2013-10-24 ENCOUNTER — Encounter: Payer: Self-pay | Admitting: Cardiology

## 2013-10-26 ENCOUNTER — Telehealth: Payer: Self-pay

## 2013-10-26 NOTE — Telephone Encounter (Signed)
Done

## 2013-10-26 NOTE — Telephone Encounter (Signed)
Can you please call patient back and set up appt for him. He was in the hospital. Thank you

## 2013-10-31 ENCOUNTER — Ambulatory Visit (INDEPENDENT_AMBULATORY_CARE_PROVIDER_SITE_OTHER): Payer: BC Managed Care – PPO | Admitting: Family Medicine

## 2013-10-31 ENCOUNTER — Encounter: Payer: Self-pay | Admitting: Family Medicine

## 2013-10-31 VITALS — BP 132/76 | HR 98 | Temp 97.5°F | Wt 217.0 lb

## 2013-10-31 DIAGNOSIS — Z8659 Personal history of other mental and behavioral disorders: Secondary | ICD-10-CM

## 2013-10-31 DIAGNOSIS — E669 Obesity, unspecified: Secondary | ICD-10-CM | POA: Insufficient documentation

## 2013-10-31 DIAGNOSIS — E119 Type 2 diabetes mellitus without complications: Secondary | ICD-10-CM

## 2013-10-31 DIAGNOSIS — IMO0001 Reserved for inherently not codable concepts without codable children: Secondary | ICD-10-CM

## 2013-10-31 DIAGNOSIS — IMO0002 Reserved for concepts with insufficient information to code with codable children: Secondary | ICD-10-CM

## 2013-10-31 DIAGNOSIS — I10 Essential (primary) hypertension: Secondary | ICD-10-CM

## 2013-10-31 DIAGNOSIS — E1165 Type 2 diabetes mellitus with hyperglycemia: Secondary | ICD-10-CM

## 2013-10-31 MED ORDER — SERTRALINE HCL 100 MG PO TABS: 100.0000 mg | ORAL_TABLET | Freq: Every day | ORAL | Status: DC
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
## 2013-10-31 NOTE — Patient Instructions (Addendum)
Start Levemir insulin 10 units once daily.  Titrate Levemir insulin up 2 units every 3 days until fasting blood sugars are consistently around 130 or less Continue oral diabetes medications for now Continue weight loss efforts Increase sertraline to 100 mg daily   Insulin Treatment for Diabetes Diabetes is a disease that does not go away (chronic). It occurs when the body does not properly use the sugar (glucose) that is released from food after it is digested. Glucose levels are controlled by a hormone called insulin, which is made by your pancreas. Depending on the type of diabetes you have, either of the following will apply:   The pancreas does not make any insulin (type 1 diabetes).  The pancreas makes too little insulin, and the body cannot respond normally to the insulin that is made (type 2 diabetes). Without insulin, death can occur. However, with the addition of insulin, blood sugar monitoring, and treatment, someone with diabetes can live a full and productive life. This document will discuss the role of insulin in your treatment and provide information about its use.  HOW IS INSULIN GIVEN? Insulin is a medicine that can only be given by injection. Taking it by mouth makes it inactive because of the acid in your stomach. Insulin is injected under the skin by a syringe and needle, an insulin pen, a pump, or a jet injector. Your dose will be determined by your health care provider based on your individual needs. You will also be given guidance on which method of giving insulin is right for you. Remember that if you give insulin with a needle and syringe, you must do so using only a special insulin syringe made for this purpose. WHERE ON THE BODY SHOULD INSULIN BE INJECTED? Insulin is injected into the fatty layer of tissue just under your skin. Good places to inject insulin include the upper arm, the front and outer area of the thigh, the hips, and the abdomen. Giving your insulin in the  abdomen is preferred because this provides the most rapid and consistent absorption. Avoid the area 2 inches (5 cm) around the navel and avoid injecting into areas on your body with scar tissue. In addition, it is important to rotate your injection sites with every shot to prevent irritation and improve absorption. WHAT ARE THE DIFFERENT TYPES OF INSULIN?  If you have type 1 diabetes, you must take insulin to stay alive. Your body does not produce it. If you have type 2 diabetes, you might require insulin in addition to, or instead of, other medicines. In either case, proper use of insulin is critical to control your diabetes.  There are a number of different types of insulin. Usually, you will give yourself injections, though others can be trained to give them to you. Some people have an insulin pump that delivers insulin continuously through a tube (cannula) that is placed under the skin. Using insulin requires that you check your blood sugar several times a day. The exact number of times and time of day to check will vary depending on your type of diabetes, your type of insulin, and treatment goals. Your health care provider will direct you.  Generally, different insulins have different properties. The following is a general guide. Specifics will vary by product, and new products are introduced periodically.   Rapid-acting insulin starts working quickly (in as little as 5 minutes) and wears off in 4 to 6 hours (sometimes longer). This type of insulin works well when taken just before  a meal to bring your blood sugar quickly back to normal.   Short-acting insulin starts working in about 30 minutes and can last 6 to 10 hours. This type of insulin should be taken about 30 minutes before you start eating a meal.  Intermediate-acting insulin starts working in 1-2 hours and wears off after about 10 to 18 hours. This insulin will lower your blood sugar for a longer period of time, but it will not be as  effective in lowering your blood sugar right after a meal.   Long-acting insulin mimics the small amount of insulin that your pancreas usually produces throughout the day. You need to have some insulin present at all times. It is crucial to the metabolism of brain cells and other cells. Long-acting insulin is meant to be used either once or twice a day. It is usually used in combination with other types of insulin, or in combination with other diabetes medicines.  Discuss the type of insulin you are taking with your health care provider or pharmacist. You will then be aware of when the insulin can be expected to peak and when it will wear off. This is important to know so you can plan for meal times and periods of exercise.  Your health care provider will usually have a strategy in mind when treating you with insulin. This will vary with your type of diabetes, your diabetes treatment goals, and your health history. It is important that you understand this strategy so you can be an active partner in treating your diabetes. Here are some terms you might hear:   Basal insulin. This refers to the small amount of insulin that needs to be present in your blood at all times. Sometimes oral medicines will be enough. For other people, and especially for people with type 1 diabetes, insulin is needed. Usually, intermediate-acting or long-acting insulin is used once or twice a day to accomplish this.   Prandial (meal-related) insulin. Your blood sugar will rise rapidly after a meal. Rapid-acting or short-acting insulin can be used right before the meal to bring your blood sugar back to normal quickly. You might be instructed to adjust the amount of insulin depending on how much carbohydrate (starch) is in your meal.   Corrective insulin. You might be instructed to check your blood sugar at certain times of the day. You then might use a small amount of rapid-acting or short-acting insulin to bring the blood sugar  down to normal if it is elevated.   Tight control (also called intensive therapy). Tight control means keeping your blood sugar as close to your target as possible and keeping it from going too high after meals. People with tight control of their diabetes are shown to have fewer long-term complications from their diabetes.   Glycohemoglobin (also called glyco, glycosylated hemoglobin, hemoglobin A1c, or A1c) level. This measures how well your blood sugar has been controlled during the past 1 to 3 months. It helps your health care provider see how effective your treatment is and decide if any changes are needed. Your health care provider will discuss your target glycohemoglobin level with you.  Insulin treatment requires your careful attention. Treatment plans will be different for different people. Some people do well with a simple program. Others require more complicated programs, with multiple insulin injections daily. You will work with your health care provider to develop the best program for you. Regardless of your insulin treatment plan, you must also do your best on weight  control, diet and food choices, exercise, blood pressure control, cholesterol control, and stress levels.  WHAT ARE THE SIDE EFFECTS OF INSULIN? Although insulin treatment is important, it does have some side effects, such as:   Insulin can cause your blood sugar to go too low (hypoglycemia).   Weight gain can occur.   Improper injection technique can cause hypoglycemia, blood sugar going too high (hyperglycemia), skin injury or irritation, or other problems. You must learn to inject insulin properly. Document Released: 12/18/2008 Document Revised: 05/24/2013 Document Reviewed: 03/05/2013 [facility] Patient Information 2014 [place], [place].

## 2013-10-31 NOTE — Progress Notes (Signed)
Pre visit review using our clinic review tool, if applicable. No additional management support is needed unless otherwise documented below in the visit note. 

## 2013-11-01 ENCOUNTER — Telehealth: Payer: Self-pay | Admitting: Family Medicine

## 2013-11-01 LAB — GLUCOSE, POCT (MANUAL RESULT ENTRY): POC Glucose: 336 mg/dl — AB (ref 70–99)

## 2013-11-01 NOTE — Telephone Encounter (Signed)
Relevant patient education assigned to patient using Emmi. ° °

## 2013-11-01 NOTE — Addendum Note (Signed)
Addended by: Marcina Millard on: 11/01/2013 02:28 PM   Modules accepted: Orders

## 2013-11-27 ENCOUNTER — Other Ambulatory Visit: Payer: Self-pay | Admitting: Family Medicine

## 2013-11-27 DIAGNOSIS — C8400 Mycosis fungoides, unspecified site: Secondary | ICD-10-CM

## 2013-12-04 DIAGNOSIS — Z7960 Long term (current) use of unspecified immunomodulators and immunosuppressants: Secondary | ICD-10-CM | POA: Insufficient documentation

## 2013-12-04 DIAGNOSIS — Z79899 Other long term (current) drug therapy: Secondary | ICD-10-CM | POA: Insufficient documentation

## 2013-12-11 ENCOUNTER — Ambulatory Visit (INDEPENDENT_AMBULATORY_CARE_PROVIDER_SITE_OTHER): Payer: BC Managed Care – PPO | Admitting: Family Medicine

## 2013-12-11 ENCOUNTER — Encounter: Payer: Self-pay | Admitting: Family Medicine

## 2013-12-11 VITALS — BP 134/80 | HR 100 | Wt 221.0 lb

## 2013-12-11 DIAGNOSIS — F329 Major depressive disorder, single episode, unspecified: Secondary | ICD-10-CM

## 2013-12-11 DIAGNOSIS — E119 Type 2 diabetes mellitus without complications: Secondary | ICD-10-CM

## 2013-12-11 DIAGNOSIS — I10 Essential (primary) hypertension: Secondary | ICD-10-CM

## 2013-12-11 DIAGNOSIS — F339 Major depressive disorder, recurrent, unspecified: Secondary | ICD-10-CM | POA: Insufficient documentation

## 2013-12-11 DIAGNOSIS — F3289 Other specified depressive episodes: Secondary | ICD-10-CM

## 2013-12-11 LAB — HEMOGLOBIN A1C: Hgb A1c MFr Bld: 10.1 % — ABNORMAL HIGH (ref 4.6–6.5)

## 2013-12-11 MED ORDER — BUPROPION HCL ER (XL) 150 MG PO TB24
150.0000 mg | ORAL_TABLET | Freq: Every day | ORAL | Status: DC
Start: 2013-12-11 — End: 2014-09-11

## 2013-12-11 NOTE — Progress Notes (Signed)
Pre visit review using our clinic review tool, if applicable. No additional management support is needed unless otherwise documented below in the visit note. 
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
## 2013-12-12 ENCOUNTER — Other Ambulatory Visit: Payer: Self-pay

## 2013-12-13 ENCOUNTER — Telehealth: Payer: Self-pay

## 2013-12-13 NOTE — Telephone Encounter (Signed)
Relevant patient education assigned to patient using Emmi. ° °

## 2013-12-28 ENCOUNTER — Ambulatory Visit: Payer: BC Managed Care – PPO | Admitting: Family Medicine

## 2014-01-11 ENCOUNTER — Other Ambulatory Visit: Payer: Self-pay

## 2014-01-25 ENCOUNTER — Telehealth: Payer: Self-pay | Admitting: Family Medicine

## 2014-01-25 DIAGNOSIS — E785 Hyperlipidemia, unspecified: Secondary | ICD-10-CM

## 2014-01-25 DIAGNOSIS — E119 Type 2 diabetes mellitus without complications: Secondary | ICD-10-CM

## 2014-01-25 MED ORDER — EZETIMIBE 10 MG PO TABS: 10.0000 mg | ORAL_TABLET | Freq: Every day | ORAL | Status: DC

## 2014-01-25 NOTE — Telephone Encounter (Signed)
Rx sent to pharmacy   

## 2014-01-25 NOTE — Telephone Encounter (Signed)
Eric Price from [facility] on [address] called to request refills for ezetimibe (ZETIA) 10 MG tablet.  FYI- pt has not had this medication filled at this location before, (p) [id number], (f) [phone]. Thanks

## 2014-02-06 ENCOUNTER — Ambulatory Visit (INDEPENDENT_AMBULATORY_CARE_PROVIDER_SITE_OTHER): Payer: BC Managed Care – PPO | Admitting: Family Medicine

## 2014-02-06 ENCOUNTER — Encounter: Payer: Self-pay | Admitting: Family Medicine

## 2014-02-06 VITALS — BP 130/74 | HR 106 | Temp 98.5°F | Wt 225.0 lb

## 2014-02-06 DIAGNOSIS — R05 Cough: Secondary | ICD-10-CM

## 2014-02-06 DIAGNOSIS — Z8659 Personal history of other mental and behavioral disorders: Secondary | ICD-10-CM

## 2014-02-06 DIAGNOSIS — IMO0002 Reserved for concepts with insufficient information to code with codable children: Secondary | ICD-10-CM

## 2014-02-06 DIAGNOSIS — E785 Hyperlipidemia, unspecified: Secondary | ICD-10-CM

## 2014-02-06 DIAGNOSIS — R059 Cough, unspecified: Secondary | ICD-10-CM

## 2014-02-06 DIAGNOSIS — IMO0001 Reserved for inherently not codable concepts without codable children: Secondary | ICD-10-CM

## 2014-02-06 DIAGNOSIS — E1165 Type 2 diabetes mellitus with hyperglycemia: Secondary | ICD-10-CM

## 2014-02-06 MED ORDER — INSULIN DETEMIR 100 UNIT/ML FLEXPEN: 10.0000 [IU] | PEN_INJECTOR | Freq: Every day | SUBCUTANEOUS | Status: DC

## 2014-02-06 MED ORDER — INSULIN ASPART 100 UNIT/ML FLEXPEN: 5.0000 [IU] | PEN_INJECTOR | Freq: Three times a day (TID) | SUBCUTANEOUS | Status: DC

## 2014-02-06 MED ORDER — HYDROCODONE-HOMATROPINE 5-1.5 MG/5ML PO SYRP: 5.0000 mL | ORAL_SOLUTION | Freq: Four times a day (QID) | ORAL | Status: AC | PRN

## 2014-02-06 MED ORDER — ATORVASTATIN CALCIUM 40 MG PO TABS: 60.0000 mg | ORAL_TABLET | Freq: Every day | ORAL | Status: DC
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
## 2014-02-06 NOTE — Progress Notes (Signed)
Pre visit review using our clinic review tool, if applicable. No additional management support is needed unless otherwise documented below in the visit note. 

## 2014-02-06 NOTE — Patient Instructions (Signed)
Levemir take 10 units once daily and may titrate up 2 units every 3 days until fasting sugars around  130 Novolog take 5 units with every meal.  Check some 2 hour postprandial sugars and if consistently > 180 titrate Novolog up to 8-10 units per meal. Go ahead and increase Lipitor to one and one half tablet daily.

## 2014-02-07 ENCOUNTER — Telehealth: Payer: Self-pay | Admitting: Family Medicine

## 2014-02-07 NOTE — Telephone Encounter (Signed)
Pt was seen yesterday and receive cough med only. Pt is requesting abx call into [facility] [address]. Please call pt back once abx has been call into pharm

## 2014-02-08 NOTE — Telephone Encounter (Signed)
Pt informed

## 2014-02-08 NOTE — Telephone Encounter (Signed)
Called home and cell numbers no answer.

## 2014-02-08 NOTE — Telephone Encounter (Signed)
Unless he has fever, suspect this is viral and antibiotic will not help.  Let me know if he develops any fever or other changes.

## 2014-03-12 ENCOUNTER — Encounter: Payer: Self-pay | Admitting: Family Medicine

## 2014-03-12 ENCOUNTER — Ambulatory Visit (INDEPENDENT_AMBULATORY_CARE_PROVIDER_SITE_OTHER): Payer: BC Managed Care – PPO | Admitting: Family Medicine

## 2014-03-12 VITALS — BP 138/90 | HR 108 | Temp 98.4°F | Wt 216.0 lb

## 2014-03-12 DIAGNOSIS — E119 Type 2 diabetes mellitus without complications: Secondary | ICD-10-CM

## 2014-03-12 DIAGNOSIS — E785 Hyperlipidemia, unspecified: Secondary | ICD-10-CM

## 2014-03-12 DIAGNOSIS — I10 Essential (primary) hypertension: Secondary | ICD-10-CM

## 2014-03-12 LAB — HEMOGLOBIN A1C: Hgb A1c MFr Bld: 10.1 % — ABNORMAL HIGH (ref 4.6–6.5)
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
## 2014-03-29 ENCOUNTER — Telehealth: Payer: Self-pay | Admitting: Family Medicine

## 2014-03-29 MED ORDER — GLUCOSE BLOOD VI STRP: 1.0000 | ORAL_STRIP | Status: DC | PRN

## 2014-03-29 NOTE — Telephone Encounter (Signed)
RX sent to pharmacy  

## 2014-03-29 NOTE — Telephone Encounter (Signed)
[id number] - [place], [place] [address] called requesting ONE TOUCH ULTRA TEST STRIPS for pt.

## 2014-05-07 ENCOUNTER — Other Ambulatory Visit: Payer: Self-pay | Admitting: Family Medicine

## 2014-05-24 ENCOUNTER — Encounter: Payer: Self-pay | Admitting: Family Medicine

## 2014-05-24 ENCOUNTER — Ambulatory Visit (INDEPENDENT_AMBULATORY_CARE_PROVIDER_SITE_OTHER): Payer: BC Managed Care – PPO | Admitting: Family Medicine

## 2014-05-24 VITALS — BP 130/80 | HR 96 | Temp 98.1°F | Wt 220.0 lb

## 2014-05-24 DIAGNOSIS — M25512 Pain in left shoulder: Secondary | ICD-10-CM

## 2014-05-24 DIAGNOSIS — S90821A Blister (nonthermal), right foot, initial encounter: Secondary | ICD-10-CM

## 2014-05-24 DIAGNOSIS — IMO0001 Reserved for inherently not codable concepts without codable children: Secondary | ICD-10-CM

## 2014-05-24 DIAGNOSIS — M25519 Pain in unspecified shoulder: Secondary | ICD-10-CM

## 2014-05-24 DIAGNOSIS — E1165 Type 2 diabetes mellitus with hyperglycemia: Secondary | ICD-10-CM

## 2014-05-24 DIAGNOSIS — K029 Dental caries, unspecified: Secondary | ICD-10-CM

## 2014-05-24 DIAGNOSIS — IMO0002 Reserved for concepts with insufficient information to code with codable children: Secondary | ICD-10-CM
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
## 2014-05-24 NOTE — Progress Notes (Signed)
Pre visit review using our clinic review tool, if applicable. No additional management support is needed unless otherwise documented below in the visit note. 

## 2014-05-25 DIAGNOSIS — K029 Dental caries, unspecified: Secondary | ICD-10-CM | POA: Insufficient documentation

## 2014-05-28 ENCOUNTER — Other Ambulatory Visit (INDEPENDENT_AMBULATORY_CARE_PROVIDER_SITE_OTHER): Payer: No Typology Code available for payment source

## 2014-05-28 ENCOUNTER — Ambulatory Visit (INDEPENDENT_AMBULATORY_CARE_PROVIDER_SITE_OTHER): Payer: No Typology Code available for payment source | Admitting: Family Medicine

## 2014-05-28 ENCOUNTER — Encounter: Payer: Self-pay | Admitting: Family Medicine

## 2014-05-28 VITALS — BP 130/80 | HR 107 | Ht 69.0 in | Wt 218.0 lb

## 2014-05-28 DIAGNOSIS — M25519 Pain in unspecified shoulder: Secondary | ICD-10-CM

## 2014-05-28 DIAGNOSIS — M25512 Pain in left shoulder: Secondary | ICD-10-CM

## 2014-05-28 DIAGNOSIS — M19019 Primary osteoarthritis, unspecified shoulder: Secondary | ICD-10-CM

## 2014-05-28 DIAGNOSIS — M545 Low back pain, unspecified: Secondary | ICD-10-CM

## 2014-05-28 DIAGNOSIS — E119 Type 2 diabetes mellitus without complications: Secondary | ICD-10-CM

## 2014-05-28 MED ORDER — GABAPENTIN 300 MG PO CAPS: 300.0000 mg | ORAL_CAPSULE | Freq: Every day | ORAL | Status: DC

## 2014-05-28 NOTE — Patient Instructions (Addendum)
Good to see you Ice 20 minutes 2 times daily. Usually after activity and before bed. Exercises 3 times a week.  Alternate between back and shoulder.  Try the pennsaid 2 times a day if needed.  Call us if you need a prescription.  Gabapentin at night.  Check sugars an extra time the next 3 days.  Take tylenol 500 mg three times a day is the best evidence based medicine we have for arthritis.  Glucosamine sulfate 750mg  twice a day is a supplement that has been shown to help moderate to severe arthritis. Vitamin D 2000 IU daily Tumeric 500mg  twice daily.  Capsaicin topically up to four times a day may also help with pain. Cortisone injections are an option if these interventions do not seem to make a difference or need more relief.  Shoe inserts with good arch support may be helpful.  [employer] orthotics at [employer] could help.  Water aerobics and cycling with low resistance are the best two types of exercise for arthritis. Come back and see me in 3-4 weeks.
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
## 2014-05-28 NOTE — Assessment & Plan Note (Signed)
The patient does have low back pain it seems to be more multifactorial. Patient likely does have some underlying osteoarthritis will try to get further imaging from outside provider. The discussed starting with an icing protocol, home exercises, and a topical anti-inflammatory. Will avoid oral anti-inflammatories secondary to patient's comorbidities. Patient will be started on gabapentin again with one pill at night I think will be beneficial for some early peripheral neuropathy. Patient states he was taking this in the past with good results. Patient will then come back and see me again in 3-4 weeks.

## 2014-05-28 NOTE — Assessment & Plan Note (Signed)
The patient was injected without any significant discomfort and almost complete resolution of pain. Encourage him to continue some of the exercises he was doing previously but was adding other exercises today. We discussed icing as well as topical anti-inflammatory. Patient notes that he can have an injection every 3-4 months as needed. Patient will followup with me again in 3-4 weeks for further evaluation and treatment.

## 2014-06-07 ENCOUNTER — Other Ambulatory Visit: Payer: Self-pay | Admitting: Family Medicine

## 2014-06-07 MED ORDER — MELOXICAM 15 MG PO TABS: 15.0000 mg | ORAL_TABLET | Freq: Every day | ORAL | Status: DC

## 2014-06-12 ENCOUNTER — Encounter: Payer: Self-pay | Admitting: Family Medicine

## 2014-06-12 ENCOUNTER — Ambulatory Visit (INDEPENDENT_AMBULATORY_CARE_PROVIDER_SITE_OTHER): Payer: No Typology Code available for payment source | Admitting: Family Medicine

## 2014-06-12 VITALS — BP 130/90 | HR 105 | Wt 219.0 lb

## 2014-06-12 DIAGNOSIS — IMO0002 Reserved for concepts with insufficient information to code with codable children: Secondary | ICD-10-CM

## 2014-06-12 DIAGNOSIS — IMO0001 Reserved for inherently not codable concepts without codable children: Secondary | ICD-10-CM | POA: Diagnosis not present

## 2014-06-12 DIAGNOSIS — E1165 Type 2 diabetes mellitus with hyperglycemia: Secondary | ICD-10-CM

## 2014-06-12 DIAGNOSIS — E785 Hyperlipidemia, unspecified: Secondary | ICD-10-CM

## 2014-06-12 DIAGNOSIS — Z23 Encounter for immunization: Secondary | ICD-10-CM

## 2014-06-12 DIAGNOSIS — I10 Essential (primary) hypertension: Secondary | ICD-10-CM

## 2014-06-12 LAB — HEMOGLOBIN A1C: HEMOGLOBIN A1C: 9.1 % — AB (ref 4.6–6.5)

## 2014-06-12 NOTE — Progress Notes (Signed)
Pre visit review using our clinic review tool, if applicable. No additional management support is needed unless otherwise documented below in the visit note. 
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
## 2014-06-12 NOTE — Patient Instructions (Signed)
Titrate Levemir  Up 2 units every 3 days until fasting sugars consistently < 130.

## 2014-06-13 ENCOUNTER — Other Ambulatory Visit: Payer: Self-pay | Admitting: Family Medicine

## 2014-06-13 DIAGNOSIS — E119 Type 2 diabetes mellitus without complications: Secondary | ICD-10-CM

## 2014-06-13 NOTE — Addendum Note (Signed)
Addended by: Noe Gens E on: 06/13/2014 12:24 PM   Modules accepted: Orders

## 2014-06-19 ENCOUNTER — Encounter: Payer: Self-pay | Admitting: Family Medicine

## 2014-06-19 ENCOUNTER — Ambulatory Visit (INDEPENDENT_AMBULATORY_CARE_PROVIDER_SITE_OTHER): Payer: No Typology Code available for payment source | Admitting: Family Medicine

## 2014-06-19 VITALS — BP 140/82 | HR 103 | Ht 69.0 in | Wt 226.0 lb

## 2014-06-19 DIAGNOSIS — M75100 Unspecified rotator cuff tear or rupture of unspecified shoulder, not specified as traumatic: Secondary | ICD-10-CM

## 2014-06-19 DIAGNOSIS — M1710 Unilateral primary osteoarthritis, unspecified knee: Secondary | ICD-10-CM | POA: Diagnosis not present

## 2014-06-19 DIAGNOSIS — IMO0002 Reserved for concepts with insufficient information to code with codable children: Secondary | ICD-10-CM

## 2014-06-19 DIAGNOSIS — M7550 Bursitis of unspecified shoulder: Secondary | ICD-10-CM | POA: Insufficient documentation

## 2014-06-19 DIAGNOSIS — M25569 Pain in unspecified knee: Secondary | ICD-10-CM

## 2014-06-19 DIAGNOSIS — M25561 Pain in right knee: Secondary | ICD-10-CM

## 2014-06-19 DIAGNOSIS — M25562 Pain in left knee: Principal | ICD-10-CM

## 2014-06-19 DIAGNOSIS — M7552 Bursitis of left shoulder: Secondary | ICD-10-CM

## 2014-06-19 NOTE — Assessment & Plan Note (Signed)
Patient was given injection today. We discussed an icing protocol as well as the possibility of formal physical therapy which patient was not adamant about. Patient was to get x-rays as well as this as well as a cervical neck. These are pending. Patient home exercises more specific to this as well as strengthening exercises. Patient will then follow up with me again in 3-4 weeks for further evaluation and treatment. We did take time to show patient proper technique with exercises.  Spent greater than 25 minutes with patient face-to-face and had greater than 50% of counseling including as described above in assessment and plan.

## 2014-06-19 NOTE — Progress Notes (Signed)
[name] [address] [place], [place] [address] Phone: [phone] Subjective:     CC: Left shoulder pain follow up  HBZ:[id number] Eric Price is a 63 y.o. male coming in with complaint of left shoulder pain. Patient was seen previously was given left a.c. joint injection. Patient states he made some improvement but was never pain free. Continues to give him some difficulty with certain activity especially reaching across his body. Patient does not have any increase in blood sugars from his injection. Patient states that it can give him some pain overall. Patient has had an cervical neck pain previously as well. This can be associated with it.  Patient also has had knee pain as well. States it is bilateral and more of a dull aching pain on the superior lateral aspect of the knee that is worse with stairs. Has pain with squatting as well. Describes it as more of a dull aching sensation. Last x-rays reviewed by me when 2005. Patient had a joint effusion of the left knee from an injury it appears that no bony abnormality noted at that time.      Past medical history, social, surgical and family history all reviewed in electronic medical record.   Review of Systems: No headache, visual changes, nausea, vomiting, diarrhea, constipation, dizziness, abdominal pain, skin rash, fevers, chills, night sweats, weight loss, swollen lymph nodes, body aches, joint swelling, muscle aches, chest pain, shortness of breath, mood changes.   Objective Blood pressure 140/82, pulse 103, height 5\' 9"  (1.753 m), weight 226 lb (102.513 kg), SpO2 99.00%.  General: No apparent distress alert and oriented x3 mood and affect normal, dressed appropriately. Obese.  HEENT: Pupils equal, extraocular movements intact  Respiratory: Patient's speak in full sentences and does not appear short of breath  Cardiovascular: No lower extremity edema, non tender, no erythema  Skin: Warm dry intact  with no signs of infection or rash on extremities or on axial skeleton.  Abdomen: Soft nontender  Neuro: Cranial nerves II through XII are intact, neurovascularly intact in all extremities with 2+ DTRs and 2+ pulses.  Lymph: No lymphadenopathy of posterior or anterior cervical chain or axillae bilaterally.  Gait normal with good balance and coordination.  MSK:  Non tender with full range of motion and good stability and symmetric strength and tone of  elbows, wrist, hip, and ankles bilaterally.  Shoulder: left Inspection reveals no abnormalities, atrophy or asymmetry. The patient has improved a.c. joint tenderness ROM is full in all planes passively. Rotator cuff strength normal throughout. signs of impingement with positive Neer and Hawkin's tests, but negative empty can sign. Speeds and Yergason's tests normal. No labral pathology noted with negative Obrien's, negative clunk and good stability. Normal scapular function observed. No painful arc and no drop arm sign. No apprehension sign  Knee: Bilateral Normal to inspection with no erythema or effusion or obvious bony abnormalities. Mild medial joint line tenderness and tenderness over the superior lateral patella. ROM full in flexion and extension and lower leg rotation. Ligaments with solid consistent endpoints including ACL, PCL, LCL, MCL. Negative Mcmurray's, Apley's, and Thessalonian tests. Non painful patellar compression. Patellar glide mild crepitus. Patellar and quadriceps tendons unremarkable. Hamstring and quadriceps strength is normal.     Procedure: Real-time Ultrasound Guided Injection of left glenohumeral joint Device: GE Logiq E  Ultrasound guided injection is preferred based studies that show increased duration, increased effect, greater accuracy, decreased procedural pain, increased response rate with ultrasound guided versus  blind injection.  Verbal informed consent obtained.  Time-out conducted.  Noted no  overlying erythema, induration, or other signs of local infection.  Skin prepped in a sterile fashion.  Local anesthesia: Topical Ethyl chloride.  With sterile technique and under real time ultrasound guidance:  Joint visualized.  23g 1  inch needle inserted posterior approach. Pictures taken for needle placement. Patient did have injection of 2 cc of 1% lidocaine, 2 cc of 0.5% Marcaine, and 1cc of Kenalog 40 mg/dL. Completed without difficulty  Pain immediately resolved suggesting accurate placement of the medication.  Advised to call if fevers/chills, erythema, induration, drainage, or persistent bleeding.  Images permanently stored and available for review in the ultrasound unit.  Impression: Technically successful ultrasound guided injection.     Impression and Recommendations:     This case required medical decision making of moderate complexity.

## 2014-06-19 NOTE — Assessment & Plan Note (Signed)
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 I would like to get x-rays which are pending today. In discussing that we discussed the possibility of bracing and patient was fitted with a brace today. We discussed an icing protocol and home exercises. Patient will try these interventions and come back and see me again in 3-4 weeks. Continuing to have pain and corticosteroid injection may be beneficial.

## 2014-06-19 NOTE — Patient Instructions (Signed)
Good to see you Watch your sugars for next 4 days closely. Ice is your friend New exercises 3 times a week See me again in 3-4 weeks.

## 2014-07-02 ENCOUNTER — Other Ambulatory Visit: Payer: Self-pay | Admitting: Family Medicine

## 2014-07-02 NOTE — Telephone Encounter (Signed)
Refill done.  

## 2014-07-16 ENCOUNTER — Ambulatory Visit: Payer: No Typology Code available for payment source | Admitting: Family Medicine

## 2014-07-17 ENCOUNTER — Ambulatory Visit: Payer: No Typology Code available for payment source | Admitting: Family Medicine

## 2014-07-23 ENCOUNTER — Ambulatory Visit: Payer: No Typology Code available for payment source | Admitting: Family Medicine

## 2014-07-27 ENCOUNTER — Ambulatory Visit: Payer: No Typology Code available for payment source | Admitting: Family Medicine

## 2014-07-30 ENCOUNTER — Other Ambulatory Visit (INDEPENDENT_AMBULATORY_CARE_PROVIDER_SITE_OTHER): Payer: No Typology Code available for payment source

## 2014-07-30 ENCOUNTER — Encounter: Payer: Self-pay | Admitting: Family Medicine

## 2014-07-30 ENCOUNTER — Ambulatory Visit (INDEPENDENT_AMBULATORY_CARE_PROVIDER_SITE_OTHER): Payer: No Typology Code available for payment source | Admitting: Family Medicine

## 2014-07-30 ENCOUNTER — Ambulatory Visit (INDEPENDENT_AMBULATORY_CARE_PROVIDER_SITE_OTHER)
Admission: RE | Admit: 2014-07-30 | Discharge: 2014-07-30 | Disposition: A | Discharge status: Home / Self Care | Payer: No Typology Code available for payment source | Source: Ambulatory Visit | Attending: Family Medicine | Admitting: Family Medicine

## 2014-07-30 VITALS — BP 132/84 | HR 113 | Ht 69.0 in | Wt 223.0 lb

## 2014-07-30 DIAGNOSIS — M19019 Primary osteoarthritis, unspecified shoulder: Secondary | ICD-10-CM

## 2014-07-30 DIAGNOSIS — M129 Arthropathy, unspecified: Secondary | ICD-10-CM | POA: Diagnosis not present

## 2014-07-30 DIAGNOSIS — M25561 Pain in right knee: Secondary | ICD-10-CM

## 2014-07-30 DIAGNOSIS — M25562 Pain in left knee: Secondary | ICD-10-CM | POA: Diagnosis not present

## 2014-07-30 DIAGNOSIS — M7552 Bursitis of left shoulder: Secondary | ICD-10-CM

## 2014-07-30 DIAGNOSIS — M1710 Unilateral primary osteoarthritis, unspecified knee: Secondary | ICD-10-CM

## 2014-07-30 NOTE — Assessment & Plan Note (Signed)
Patient may need repeat injection if continuing to have pain. We will discuss at follow-up.

## 2014-07-30 NOTE — Assessment & Plan Note (Signed)
Patient had bilateral injections today and was given a brace that was fitted by me. We discussed about icing protocol as well as home exercises. We discussed over-the-counter medications that could be beneficial and which activities to avoid and which ones would be beneficial. We discussed about strengthening the quadriceps and how this will be helpful. Patient will follow-up with me again in 3 weeks. Patient continues to have pain he would be a candidate for viscous supplementation.  Spent greater than 25 minutes with patient face-to-face and had greater than 50% of counseling including as described above in assessment and plan.

## 2014-07-30 NOTE — Patient Instructions (Signed)
Good to see you Continue the exercises for your shoulder For your knees try exercises 3 times a week.  Try brace with activity Get xrays downstairs  Take tylenol 650 mg three times a day is the best evidence based medicine we have for arthritis.  Glucosamine sulfate 750mg  twice a day is a supplement that has been shown to help moderate to severe arthritis. Vitamin D 2000 IU daily Fish oil 2 grams daily.  Tumeric 500mg  twice daily.  Capsaicin topically up to four times a day may also help with pain. Cortisone injections are an option if these interventions do not seem to make a difference or need more relief.  If cortisone injections do not help, there are different types of shots that may help but they take longer to take effect.  We can discuss this at follow up.  It's important that you continue to stay active. Controlling your weight is important.  Consider physical therapy to strengthen muscles around the joint that hurts to take pressure off of the joint itself. Shoe inserts with good arch support may be helpful.  [employer] orthotics at [employer] [facility] could help.  Water aerobics and cycling with low resistance are the best two types of exercise for arthritis. Come back and see me in 3 weeks.

## 2014-07-30 NOTE — Progress Notes (Signed)
  [name] [place], [place] [address] Phone: [phone] Subjective:     CC: Bilateral knee pain.   [id number] Eric Price is a 63 y.o. male coming in with complaint of Bilateral knee pain.  Seen previously and had effusion noted after injury.  Patient states that now he has pain bilaterally. Worse with walking and twisting. No swelling no radiation of pain, some crepitus. Worse with going up stairs, can keep him up at night.           Past medical history, social, surgical and family history all reviewed in electronic medical record.   Review of Systems: No headache, visual changes, nausea, vomiting, diarrhea, constipation, dizziness, abdominal pain, skin rash, fevers, chills, night sweats, weight loss, swollen lymph nodes, body aches, joint swelling, muscle aches, chest pain, shortness of breath, mood changes.   Objective Blood pressure 132/84, pulse 113, height 5\' 9"  (1.753 m), weight 223 lb (101.152 kg), SpO2 97.00%.  General: No apparent distress alert and oriented x3 mood and affect normal, dressed appropriately. Obese.  HEENT: Pupils equal, extraocular movements intact  Respiratory: Patient's speak in full sentences and does not appear short of breath  Cardiovascular: No lower extremity edema, non tender, no erythema  Skin: Warm dry intact with no signs of infection or rash on extremities or on axial skeleton.  Abdomen: Soft nontender  Neuro: Cranial nerves II through XII are intact, neurovascularly intact in all extremities with 2+ DTRs and 2+ pulses.  Lymph: No lymphadenopathy of posterior or anterior cervical chain or axillae bilaterally.  Gait normal with good balance and coordination.  MSK:  Non tender with full range of motion and good stability and symmetric strength and tone of  elbows, wrist, hip, and ankles bilaterally.  Shoulder: left Inspection reveals no abnormalities, atrophy or asymmetry. The patient has improved  a.c. joint tenderness ROM is full in all planes passively. Rotator cuff strength normal throughout. signs of impingement with positive Neer and Hawkin's tests, but negative empty can sign. Speeds and Yergason's tests normal. No labral pathology noted with negative Obrien's, negative clunk and good stability. Normal scapular function observed. No painful arc and no drop arm sign. No apprehension sign  Knee: Bilateral Normal to inspection with no erythema or effusion or obvious bony abnormalities. Mild medial joint line tenderness and tenderness over the superior lateral patella. ROM full in flexion and extension and lower leg rotation. Ligaments with solid consistent endpoints including ACL, PCL, LCL, MCL. Negative Mcmurray's, Apley's, and Thessalonian tests. Non painful patellar compression. Patellar glide mild crepitus. Patellar and quadriceps tendons unremarkable. Hamstring and quadriceps strength is normal.     After informed written and verbal consent, patient was seated on exam table. Right knee was prepped with alcohol swab and utilizing anterolateral approach, patient's right knee space was injected with 4:1  marcaine 0.5%: Kenalog 40mg /dL. Patient tolerated the procedure well without immediate complications.  After informed written and verbal consent, patient was seated on exam table. Left knee was prepped with alcohol swab and utilizing anterolateral approach, patient's left knee space was injected with 4:1  marcaine 0.5%: Kenalog 40mg /dL. Patient tolerated the procedure well without immediate complications.     Impression and Recommendations:     This case required medical decision making of moderate complexity.

## 2014-07-30 NOTE — Assessment & Plan Note (Signed)
Better after injection at this time. Encourage patient to continue the home exercises on a regular basis. Discussed continuing the icing as well as topical anti-inflammatories as needed. Patient will follow-up in 3 weeks.

## 2014-08-08 ENCOUNTER — Ambulatory Visit (INDEPENDENT_AMBULATORY_CARE_PROVIDER_SITE_OTHER): Payer: No Typology Code available for payment source | Admitting: Internal Medicine

## 2014-08-08 ENCOUNTER — Encounter: Payer: Self-pay | Admitting: Internal Medicine

## 2014-08-08 VITALS — BP 130/86 | HR 106 | Temp 98.3°F | Resp 20 | Ht 69.0 in | Wt 223.0 lb

## 2014-08-08 DIAGNOSIS — IMO0002 Reserved for concepts with insufficient information to code with codable children: Secondary | ICD-10-CM

## 2014-08-08 DIAGNOSIS — E1165 Type 2 diabetes mellitus with hyperglycemia: Secondary | ICD-10-CM

## 2014-08-08 DIAGNOSIS — R519 Headache, unspecified: Secondary | ICD-10-CM

## 2014-08-08 DIAGNOSIS — R51 Headache: Secondary | ICD-10-CM

## 2014-08-08 DIAGNOSIS — M545 Low back pain: Secondary | ICD-10-CM

## 2014-08-08 MED ORDER — CYCLOBENZAPRINE HCL 10 MG PO TABS: 10.0000 mg | ORAL_TABLET | Freq: Three times a day (TID) | ORAL | Status: DC | PRN

## 2014-08-08 MED ORDER — MELOXICAM 15 MG PO TABS: 15.0000 mg | ORAL_TABLET | Freq: Every day | ORAL | Status: DC

## 2014-08-08 MED ORDER — INSULIN DETEMIR 100 UNIT/ML FLEXPEN: PEN_INJECTOR | SUBCUTANEOUS | Status: DC

## 2014-08-08 NOTE — Patient Instructions (Addendum)
Increase Levemir  Bedtime)  By 4 units  (30 to 34 untis) weekly until Fasting sugar <  130 NovoLog ( mealtime) 5 units prior to each meal  Return in 3 weeks for follow-up  Continue meloxicam and cyclobenzaprine as needed for headache

## 2014-08-08 NOTE — Progress Notes (Signed)
Pre visit review using our clinic review tool, if applicable. No additional management support is needed unless otherwise documented below in the visit note. 
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
## 2014-08-16 ENCOUNTER — Encounter: Payer: Self-pay | Admitting: Family Medicine

## 2014-08-16 ENCOUNTER — Ambulatory Visit (INDEPENDENT_AMBULATORY_CARE_PROVIDER_SITE_OTHER): Payer: No Typology Code available for payment source | Admitting: Family Medicine

## 2014-08-16 VITALS — BP 132/80 | HR 114 | Temp 98.3°F | Wt 226.0 lb

## 2014-08-16 DIAGNOSIS — B9789 Other viral agents as the cause of diseases classified elsewhere: Secondary | ICD-10-CM

## 2014-08-16 DIAGNOSIS — E119 Type 2 diabetes mellitus without complications: Secondary | ICD-10-CM

## 2014-08-16 DIAGNOSIS — R252 Cramp and spasm: Secondary | ICD-10-CM

## 2014-08-16 DIAGNOSIS — Z1211 Encounter for screening for malignant neoplasm of colon: Secondary | ICD-10-CM

## 2014-08-16 DIAGNOSIS — J069 Acute upper respiratory infection, unspecified: Secondary | ICD-10-CM

## 2014-08-16 MED ORDER — GLUCOSE BLOOD VI STRP: ORAL_STRIP | Status: DC

## 2014-08-16 MED ORDER — CARVEDILOL 3.125 MG PO TABS: 3.1250 mg | ORAL_TABLET | Freq: Two times a day (BID) | ORAL | Status: DC

## 2014-08-16 MED ORDER — ONETOUCH LANCETS MISC: Status: DC

## 2014-08-16 NOTE — Progress Notes (Signed)
Pre visit review using our clinic review tool, if applicable. No additional management support is needed unless otherwise documented below in the visit note. 
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
## 2014-08-16 NOTE — Patient Instructions (Signed)
    Muscle Cramps and Spasms Muscle cramps and spasms occur when a muscle or muscles tighten and you have no control over this tightening (involuntary muscle contraction). They are a common problem and can develop in any muscle. The most common place is in the calf muscles of the leg. Both muscle cramps and muscle spasms are involuntary muscle contractions, but they also have differences:   Muscle cramps are sporadic and painful. They may last a few seconds to a quarter of an hour. Muscle cramps are often more forceful and last longer than muscle spasms.  Muscle spasms may or may not be painful. They may also last just a few seconds or much longer. CAUSES  It is uncommon for cramps or spasms to be due to a serious underlying problem. In many cases, the cause of cramps or spasms is unknown. Some common causes are:   Overexertion.   Overuse from repetitive motions (doing the same thing over and over).   Remaining in a certain position for a long period of time.   Improper preparation, form, or technique while performing a sport or activity.   Dehydration.   Injury.   Side effects of some medicines.   Abnormally low levels of the salts and ions in your blood (electrolytes), especially potassium and calcium. This could happen if you are taking water pills (diuretics) or you are pregnant.  Some underlying medical problems can make it more likely to develop cramps or spasms. These include, but are not limited to:   Diabetes.   Parkinson disease.   Hormone disorders, such as thyroid problems.   Alcohol abuse.   Diseases specific to muscles, joints, and bones.   Blood vessel disease where not enough blood is getting to the muscles.  HOME CARE INSTRUCTIONS   Stay well hydrated. Drink enough water and fluids to keep your urine clear or pale yellow.  It may be helpful to massage, stretch, and relax the affected muscle.  For tight or tense muscles, use a warm towel,  heating pad, or hot shower water directed to the affected area.  If you are sore or have pain after a cramp or spasm, applying ice to the affected area may relieve discomfort.  Put ice in a plastic bag.  Place a towel between your skin and the bag.  Leave the ice on for 15-20 minutes, 03-04 times a day.  Medicines used to treat a known cause of cramps or spasms may help reduce their frequency or severity. Only take over-the-counter or prescription medicines as directed by your caregiver. SEEK MEDICAL CARE IF:  Your cramps or spasms get more severe, more frequent, or do not improve over time.  MAKE SURE YOU:   Understand these instructions.  Will watch your condition.  Will get help right away if you are not doing well or get worse. Document Released: 03/13/2002 Document Revised: 01/16/2013 Document Reviewed: 09/07/2012 [facility] Patient Information 2015 [place], [place]. This information is not intended to replace advice given to you by your health care provider. Make sure you discuss any questions you have with your health care provider.   Get back on the Carvedilol 3.125 mg one twice daily.

## 2014-08-17 LAB — BASIC METABOLIC PANEL
BUN: 14 mg/dL (ref 6–23)
CHLORIDE: 103 meq/L (ref 96–112)
CO2: 27 mEq/L (ref 19–32)
Calcium: 9.3 mg/dL (ref 8.4–10.5)
Creatinine, Ser: 1.1 mg/dL (ref 0.4–1.5)
GFR: 83.98 mL/min (ref >60.00)
GLUCOSE: 221 mg/dL — AB (ref 70–99)
POTASSIUM: 4.6 meq/L (ref 3.5–5.1)
Sodium: 137 mEq/L (ref 135–145)

## 2014-08-17 LAB — MAGNESIUM: Magnesium: 2.2 mg/dL (ref 1.5–2.5)

## 2014-08-21 ENCOUNTER — Other Ambulatory Visit (INDEPENDENT_AMBULATORY_CARE_PROVIDER_SITE_OTHER): Payer: No Typology Code available for payment source

## 2014-08-21 ENCOUNTER — Other Ambulatory Visit: Payer: No Typology Code available for payment source

## 2014-08-21 ENCOUNTER — Ambulatory Visit (INDEPENDENT_AMBULATORY_CARE_PROVIDER_SITE_OTHER): Payer: No Typology Code available for payment source | Admitting: Family Medicine

## 2014-08-21 ENCOUNTER — Encounter: Payer: Self-pay | Admitting: Family Medicine

## 2014-08-21 VITALS — BP 122/82 | HR 106 | Ht 69.0 in | Wt 220.0 lb

## 2014-08-21 DIAGNOSIS — M6289 Other specified disorders of muscle: Secondary | ICD-10-CM

## 2014-08-21 DIAGNOSIS — M119 Crystal arthropathy, unspecified: Secondary | ICD-10-CM | POA: Insufficient documentation

## 2014-08-21 LAB — CBC WITH DIFFERENTIAL/PLATELET
Basophils Absolute: 0 10*3/uL (ref 0.0–0.1)
Basophils Relative: 0.3 % (ref 0.0–3.0)
EOS ABS: 0.1 10*3/uL (ref 0.0–0.7)
Eosinophils Relative: 0.8 % (ref 0.0–5.0)
HCT: 48.2 % (ref 39.0–52.0)
Hemoglobin: 16.1 g/dL (ref 13.0–17.0)
LYMPHS PCT: 31.3 % (ref 12.0–46.0)
Lymphs Abs: 2.5 10*3/uL (ref 0.7–4.0)
MCHC: 33.4 g/dL (ref 30.0–36.0)
MCV: 84.8 fl (ref 78.0–100.0)
Monocytes Absolute: 0.7 10*3/uL (ref 0.1–1.0)
Monocytes Relative: 8 % (ref 3.0–12.0)
NEUTROS PCT: 59.6 % (ref 43.0–77.0)
Neutro Abs: 4.9 10*3/uL (ref 1.4–7.7)
Platelets: 348 10*3/uL (ref 150.0–400.0)
RBC: 5.68 Mil/uL (ref 4.22–5.81)
RDW: 13.6 % (ref 11.5–15.5)
WBC: 8.1 10*3/uL (ref 4.0–10.5)

## 2014-08-21 LAB — CK: CK TOTAL: 95 U/L (ref 7–232)

## 2014-08-21 LAB — FERRITIN: Ferritin: 169.9 ng/mL (ref 22.0–322.0)

## 2014-08-21 LAB — URIC ACID: URIC ACID, SERUM: 5.1 mg/dL (ref 4.0–7.8)

## 2014-08-21 NOTE — Progress Notes (Signed)
  [name] [place], [place] [address] Phone: [phone] Subjective:     CC: Bilateral knee pain.   XAJ:OINOMVEHMC Eric Price is a 63 y.o. male coming in with complaint of Bilateral knee pain.  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 Patient was given a steroid injections in each knee. Patient states that he is approximately 80% better. States that he still has some mild dull aching sensation and it seems to be intermittent. Patient states that he does not notice any association and can be any time during the day. Denies any clicking or locking on him. States though that he can swell up and become red and painful and then seems to dissipate after approximately 5 hours. Denies any fevers or chills or any abnormal weight loss. Can keep him up at night.          Past medical history, social, surgical and family history all reviewed in electronic medical record.   Review of Systems: No headache, visual changes, nausea, vomiting, diarrhea, constipation, dizziness, abdominal pain, skin rash, fevers, chills, night sweats, weight loss, swollen lymph nodes, body aches, joint swelling, muscle aches, chest pain, shortness of breath, mood changes.   Objective Blood pressure 122/82, pulse 106, height 5\' 9"  (1.753 m), weight 220 lb (99.791 kg), SpO2 95 %.  General: No apparent distress alert and oriented x3 mood and affect normal, dressed appropriately. Obese.  HEENT: Pupils equal, extraocular movements intact  Respiratory: Patient's speak in full sentences and does not appear short of breath  Cardiovascular: No lower extremity edema, non tender, no erythema  Skin: Warm dry intact with no signs of infection or rash on extremities or on axial skeleton.  Abdomen: Soft nontender  Neuro: Cranial nerves II through XII are intact, neurovascularly intact in all extremities with 2+ DTRs and 2+ pulses.    Lymph: No lymphadenopathy of posterior or anterior cervical chain or axillae bilaterally.  Gait normal with good balance and coordination.  MSK:  Non tender with full range of motion and good stability and symmetric strength and tone of  elbows, wrist, hip, and ankles bilaterally.  Shoulder: left Inspection reveals no abnormalities, atrophy or asymmetry. The patient has improved a.c. joint tenderness ROM is full in all planes passively. Rotator cuff strength normal throughout. signs of impingement with positive Neer and Hawkin's tests, but negative empty can sign. Speeds and Yergason's tests normal. No labral pathology noted with negative Obrien's, negative clunk and good stability. Normal scapular function observed. No painful arc and no drop arm sign. No apprehension sign  Knee: Bilateral Normal to inspection with no erythema or effusion or obvious bony abnormalities. Mild medial joint line tenderness and tenderness over the superior lateral patella. ROM full in flexion and extension and lower leg rotation. Ligaments with solid consistent endpoints including ACL, PCL, LCL, MCL. Negative Mcmurray's, Apley's, and Thessalonian tests. Non painful patellar compression. Patellar glide mild crepitus. Patellar and quadriceps tendons unremarkable. Hamstring and quadriceps strength is normal.  No significant change from previous exam       Impression and Recommendations:     This case required medical decision making of moderate complexity.

## 2014-08-21 NOTE — Assessment & Plan Note (Signed)
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 Patient does have chemotherapy and has had radiation in the past. Patient will be started on allopurinol and a very low dose. Warned of potential side effects. Discuss continuing home exercises of the icing protocol. Patient will continue with the bracing as well. Patient will come back and see me again in 3-4 weeks to make sure he continues to improve. If patient does not make any significant improvement we will increase the allopurinol.  Spent greater than 25 minutes with patient face-to-face and had greater than 50% of counseling including as described above in assessment and plan.

## 2014-08-21 NOTE — Patient Instructions (Signed)
Overall you are doing great  Allopurinol 100mg  daily.  Iron 325mg  daily for cramping.  We will get labs and I will release them to you Continue the exercises 3 times a week Wear brace when you need it.  See me again in 3-4 weeks.

## 2014-09-04 ENCOUNTER — Telehealth: Payer: Self-pay | Admitting: Family Medicine

## 2014-09-04 NOTE — Telephone Encounter (Signed)
Noted  

## 2014-09-04 NOTE — Telephone Encounter (Signed)
Patient Information:  Caller Name: Tyrez  Phone: [phone]  Patient: Eric Price, Eric Price  Gender: Male  DOB: 07/26/1951  Age: 63 Years  PCP: [facility])  Office Follow Up:  Does the office need to follow up with this patient?: No  Instructions For The Office: N/A   Symptoms  Reason For Call & Symptoms: Pt is calling about his toes being numb. Onset 1 -2 weeks ago. Pt is a diabetic. Pt is on chemotherapy and radiation.  Reviewed Health History In EMR: Yes  Reviewed Medications In EMR: Yes  Reviewed Allergies In EMR: Yes  Reviewed Surgeries / Procedures: Yes  Date of Onset of Symptoms: 08/29/2014  Guideline(s) Used:  Foot Pain  Disposition Per Guideline:   See Today in Office  Reason For Disposition Reached:   Numbness in one foot (i.e., loss of sensation)  Advice Given:  N/A  Patient Will Follow Care Advice:  YES

## 2014-09-04 NOTE — Telephone Encounter (Signed)
Addendum: RN called back line at office and spoke with Estill Bamberg. She advised pt call his oncologist for advice since he just had a treatment today. If no response from them/we would advise pt go to ED for his symptoms.Brantley Fling, RN

## 2014-09-06 ENCOUNTER — Telehealth: Payer: Self-pay | Admitting: Family Medicine

## 2014-09-06 NOTE — Telephone Encounter (Signed)
Pt request phone call from the assistant. Please call pt back

## 2014-09-06 NOTE — Telephone Encounter (Signed)
Spoke to pt, he states that his toes in both feet are numb & the big toe of his left foot looks a little discolored. Pt denies any swelling or redness. He said this has been going on for 2 weeks & would like to be seen. I told pt I would check with dr. Tamala Julian to see if we could work him in tomorrow & i would call him first thing in the morning. Pt understood.

## 2014-09-06 NOTE — Telephone Encounter (Signed)
Sure work him in. Thanks you

## 2014-09-07 ENCOUNTER — Ambulatory Visit (INDEPENDENT_AMBULATORY_CARE_PROVIDER_SITE_OTHER): Payer: BC Managed Care – PPO | Admitting: Family Medicine

## 2014-09-07 ENCOUNTER — Encounter: Payer: Self-pay | Admitting: Family Medicine

## 2014-09-07 DIAGNOSIS — G609 Hereditary and idiopathic neuropathy, unspecified: Secondary | ICD-10-CM

## 2014-09-07 MED ORDER — GABAPENTIN 100 MG PO CAPS
100.0000 mg | ORAL_CAPSULE | Freq: Two times a day (BID) | ORAL | Status: DC
Start: 2014-09-07 — End: 2015-01-21

## 2014-09-07 NOTE — Telephone Encounter (Signed)
Called pt, scheduled him for 11:45a.

## 2014-09-07 NOTE — Assessment & Plan Note (Signed)
Patient does have diabetes with an A1c greater than 9 as well as receiving radiation therapy and chemotherapy previously. I believe that these are all multifactorial and is cause some peripheral neuropathy. We discussed over-the-counter medications he could be beneficial. We discussed an icing regimen. We discussed proper shoewear is a can be beneficial. Patient given a trial of topical anti-inflammatories and we will increase patient's gabapentin to 100 mg in the morning and afternoon while 300 mg at night. Patient and will come back and see me again in 3-4 weeks to make sure he continues to improve. Hx of his feet to make sure there are no sores.  Spent greater than 25 minutes with patient face-to-face and had greater than 50% of counseling including as described above in assessment and plan.

## 2014-09-07 NOTE — Progress Notes (Signed)
  [name] [place], [place] [address] Phone: [phone] Subjective:     CC: Bilateral feet pain and numbness  YDX:AJOINOMVEH Eric Price is a 63 y.o. male coming in with complaint of Bilateral feet pain and numbness.  Patient states this is started over the course last 2 weeks. Patient was having muscle fatigue and cramping previously. Patient is a diabetic as well as getting radiation a fairly regular basis. Patient states that this is more in his toes in the forefoot bilaterally. Patient feels like there is almost with glove over his toes. States that this can be painful. States that he has not tripped or felt like he is having difficulty with balance but feels like sometimes difficult to know where his toes are. Patient is concerned because he did lose a friend recently approximately 1 month ago who had multiple amputations secondary to peripheral neuropathy. Denies any new injury.    Patient is taking gabapentin fairly regularly at night.     Past medical history, social, surgical and family history all reviewed in electronic medical record.   Review of Systems: No headache, visual changes, nausea, vomiting, diarrhea, constipation, dizziness, abdominal pain, skin rash, fevers, chills, night sweats, weight loss, swollen lymph nodes, body aches, joint swelling, muscle aches, chest pain, shortness of breath, mood changes.   Objective Vitals reviewed   General: No apparent distress alert and oriented x3 mood and affect normal, dressed appropriately. Obese.  HEENT: Pupils equal, extraocular movements intact  Respiratory: Patient's speak in full sentences and does not appear short of breath  Cardiovascular: No lower extremity edema, non tender, no erythema  Skin: Warm dry intact with no signs of infection or rash on extremities or on axial skeleton.  Abdomen: Soft nontender  Neuro: Cranial nerves II through XII are intact, neurovascularly intact  in all extremities with 2+ DTRs and 2+ pulses.  Lymph: No lymphadenopathy of posterior or anterior cervical chain or axillae bilaterally.  Gait normal with good balance and coordination.  MSK:  Non tender with full range of motion and good stability and symmetric strength and tone of  elbows, wrist, hip, and ankles bilaterally.    Foot exam shows the patient does have mild fibular deviation of the large toe bilaterally. Mild hallux rigidus bilaterally. Patient does have actually a pes cavus bilaterally. No significant breakdown noted. Neurovascularly he does seem to be intact today. Good capillary refill. No swelling and no redness noted. No skin breakdown.       Impression and Recommendations:     This case required medical decision making of moderate complexity.

## 2014-09-07 NOTE — Patient Instructions (Signed)
Good to see you I think this is peripheral neuropathy.  Try the pennsaid twice daily and see if it helps.  Wear socks at night Keep lotion on your feet.  Gabapaentin 100mg  in AM and PM   Peripheral Neuropathy Peripheral neuropathy is a type of nerve damage. It affects nerves that carry signals between the spinal cord and other parts of the body. These are called peripheral nerves. With peripheral neuropathy, one nerve or a group of nerves may be damaged.  CAUSES  Many things can damage peripheral nerves. For some people with peripheral neuropathy, the cause is unknown. Some causes include:  Diabetes. This is the most common cause of peripheral neuropathy.  Injury to a nerve.  Pressure or stress on a nerve that lasts a long time.  Too little vitamin B. Alcoholism can lead to this.  Infections.  Autoimmune diseases, such as multiple sclerosis and systemic lupus erythematosus.  Inherited nerve diseases.  Some medicines, such as cancer drugs.  Toxic substances, such as lead and mercury.  Too little blood flowing to the legs.  Kidney disease.  Thyroid disease. SIGNS AND SYMPTOMS  Different people have different symptoms. The symptoms you have will depend on which of your nerves is damaged. Common symptoms include:  Loss of feeling (numbness) in the feet and hands.  Tingling in the feet and hands.  Pain that burns.  Very sensitive skin.  Weakness.  Not being able to move a part of the body (paralysis).  Muscle twitching.  Clumsiness or poor coordination.  Loss of balance.  Not being able to control your bladder.  Feeling dizzy.  Sexual problems. DIAGNOSIS  Peripheral neuropathy is a symptom, not a disease. Finding the cause of peripheral neuropathy can be hard. To figure that out, your health care provider will take a medical history and do a physical exam. A neurological exam will also be done. This involves checking things affected by your brain, spinal  cord, and nerves (nervous system). For example, your health care provider will check your reflexes, how you move, and what you can feel.  Other types of tests may also be ordered, such as:  Blood tests.  A test of the fluid in your spinal cord.  Imaging tests, such as CT scans or an MRI.  Electromyography (EMG). This test checks the nerves that control muscles.  Nerve conduction velocity tests. These tests check how fast messages pass through your nerves.  Nerve biopsy. A small piece of nerve is removed. It is then checked under a microscope. TREATMENT   Medicine is often used to treat peripheral neuropathy. Medicines may include:  Pain-relieving medicines. Prescription or over-the-counter medicine may be suggested.  Antiseizure medicine. This may be used for pain.  Antidepressants. These also may help ease pain from neuropathy.  Lidocaine. This is a numbing medicine. You might wear a patch or be given a shot.  Mexiletine. This medicine is typically used to help control irregular heart rhythms.  Surgery. Surgery may be needed to relieve pressure on a nerve or to destroy a nerve that is causing pain.  Physical therapy to help movement.  Assistive devices to help movement. HOME CARE INSTRUCTIONS   Only take over-the-counter or prescription medicines as directed by your health care provider. Follow the instructions carefully for any given medicines. Do not take any other medicines without first getting approval from your health care provider.  If you have diabetes, work closely with your health care provider to keep your blood sugar under control.  If you have numbness in your feet:  Check every day for signs of injury or infection. Watch for redness, warmth, and swelling.  Wear padded socks and comfortable shoes. These help protect your feet.  Do not do things that put pressure on your damaged nerve.  Do not smoke. Smoking keeps blood from getting to damaged  nerves.  Avoid or limit alcohol. Too much alcohol can cause a lack of B vitamins. These vitamins are needed for healthy nerves.  Develop a good support system. Coping with peripheral neuropathy can be stressful. Talk to a mental health specialist or join a support group if you are struggling.  Follow up with your health care provider as directed. SEEK MEDICAL CARE IF:   You have new signs or symptoms of peripheral neuropathy.  You are struggling emotionally from dealing with peripheral neuropathy.  You have a fever. SEEK IMMEDIATE MEDICAL CARE IF:   You have an injury or infection that is not healing.  You feel very dizzy or begin vomiting.  You have chest pain.  You have trouble breathing. Document Released: 09/11/2002 Document Revised: 06/03/2011 Document Reviewed: 05/29/2013 [facility] Patient Information 2015 [place], [place]. This information is not intended to replace advice given to you by your health care provider. Make sure you discuss any questions you have with your health care provider.

## 2014-09-11 ENCOUNTER — Ambulatory Visit: Payer: No Typology Code available for payment source | Admitting: Family Medicine

## 2014-09-11 ENCOUNTER — Other Ambulatory Visit: Payer: Self-pay | Admitting: Family Medicine

## 2014-09-13 ENCOUNTER — Encounter (HOSPITAL_COMMUNITY): Payer: Self-pay | Admitting: Cardiovascular Disease

## 2014-09-14 ENCOUNTER — Telehealth: Payer: Self-pay

## 2014-09-14 MED ORDER — CLOBETASOL PROPIONATE 0.05 % EX CREA: 1.0000 "application " | TOPICAL_CREAM | Freq: Two times a day (BID) | CUTANEOUS | Status: DC

## 2014-09-14 NOTE — Telephone Encounter (Signed)
Rx request for clobetasol prop 0.05% cream 15 gm- apply to the affected are twice daily QTY: 60  Pharm:  [facility].

## 2014-09-14 NOTE — Telephone Encounter (Signed)
Rx sent to pharmacy   

## 2014-09-17 ENCOUNTER — Ambulatory Visit: Payer: BC Managed Care – PPO | Admitting: Family Medicine

## 2014-09-19 ENCOUNTER — Encounter: Payer: Self-pay | Admitting: Family Medicine

## 2014-09-19 ENCOUNTER — Other Ambulatory Visit: Payer: Self-pay | Admitting: Family Medicine

## 2014-09-19 ENCOUNTER — Ambulatory Visit (INDEPENDENT_AMBULATORY_CARE_PROVIDER_SITE_OTHER): Payer: BC Managed Care – PPO | Admitting: Family Medicine

## 2014-09-19 VITALS — BP 136/90 | HR 93 | Temp 97.9°F | Wt 226.0 lb

## 2014-09-19 DIAGNOSIS — L720 Epidermal cyst: Secondary | ICD-10-CM

## 2014-09-19 DIAGNOSIS — G609 Hereditary and idiopathic neuropathy, unspecified: Secondary | ICD-10-CM

## 2014-09-19 DIAGNOSIS — Z794 Long term (current) use of insulin: Secondary | ICD-10-CM | POA: Insufficient documentation

## 2014-09-19 DIAGNOSIS — N183 Chronic kidney disease, stage 3 (moderate): Secondary | ICD-10-CM

## 2014-09-19 DIAGNOSIS — E1165 Type 2 diabetes mellitus with hyperglycemia: Secondary | ICD-10-CM

## 2014-09-19 DIAGNOSIS — E1122 Type 2 diabetes mellitus with diabetic chronic kidney disease: Secondary | ICD-10-CM | POA: Insufficient documentation

## 2014-09-19 DIAGNOSIS — E119 Type 2 diabetes mellitus without complications: Secondary | ICD-10-CM

## 2014-09-19 LAB — HEMOGLOBIN A1C: Hgb A1c MFr Bld: 8 % — ABNORMAL HIGH (ref 4.6–6.5)

## 2014-09-19 NOTE — Progress Notes (Signed)
Pre visit review using our clinic review tool, if applicable. No additional management support is needed unless otherwise documented below in the visit note. 
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
## 2014-09-20 ENCOUNTER — Telehealth: Payer: Self-pay | Admitting: Family Medicine

## 2014-09-20 NOTE — Telephone Encounter (Signed)
emmi emailed °

## 2014-10-01 ENCOUNTER — Other Ambulatory Visit: Payer: Self-pay | Admitting: Family Medicine

## 2014-10-08 ENCOUNTER — Ambulatory Visit (INDEPENDENT_AMBULATORY_CARE_PROVIDER_SITE_OTHER): Payer: 59 | Admitting: Podiatry

## 2014-10-08 ENCOUNTER — Encounter: Payer: Self-pay | Admitting: Podiatry

## 2014-10-08 VITALS — BP 180/88 | HR 87 | Resp 14 | Ht 69.0 in | Wt 225.0 lb

## 2014-10-08 DIAGNOSIS — E0849 Diabetes mellitus due to underlying condition with other diabetic neurological complication: Secondary | ICD-10-CM

## 2014-10-08 DIAGNOSIS — M79673 Pain in unspecified foot: Secondary | ICD-10-CM | POA: Diagnosis not present

## 2014-10-08 NOTE — Progress Notes (Signed)
   Subjective:    Patient ID: Eric Price, male    DOB: 30-Jan-1951, 64 y.o.   MRN: [id number]  HPI Comments: N peripheral neuropathy L B/L plantar feet D 1 month O on and off C numb pain A diabetic pt, with hx of unmanaged glucose ranges T Dr. Elease Hashimoto referred to [facility], and Dr. Hulan Saas looked at his feet but basically treated for back and other joints.   He complains of intermittent sharp pains in his feet on and off weightbearing exacerbated with light touch, including the blankets at night  he is currently taking gabapentin 300 mg at bedtime and 100 mg 3 times a day when necessary. He describes some drowsiness in the morning when he takes to 300 mg gabapentin. He is also taking Wellbutrin.  He denies any history of foot ulcerations or claudication Semiretired upholsterer  Review of Systems  Gastrointestinal: Positive for nausea.       Chemotherapy medication for skin cancer - nauseates.  All other systems reviewed and are negative.      Objective:   Physical Exam  Orientated 3 patient presents with his wife  Vascular: Left DP 0/4 Right DP 2/4 PT pulses 2/4 bilaterally Capillary reflex immediate bilaterally  Neurological: Sensation to 10 g monofilament wire intact 4/5 right and 5/5 left Vibratory sensation intact bilaterally Ankle reflexes equal and reactive bilaterally  Dermatological: Texture and turgor within normal limits bilaterally  Musculoskeletal: Pes planus bilaterally No deformities noted bilaterally There is no restriction ankle, subtalar, midtarsal joints bilaterally      Assessment & Plan:   Assessment: Protective sensation intact bilaterally Vascular status appears adequate bilaterally 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 I made him aware that there was no exact treatment for the condition, however, there were medications that  sometimes could reduce the symptoms. As patient is currently taking gabapentin and Wellbutrin I do not want to alter add-on any other medications. I suggested to patient that he works with his primary care physician to see if any other medications or increasing the gabapentin will reduces symptoms.

## 2014-10-08 NOTE — Patient Instructions (Addendum)
Discuss with your primary care physician treatment options for diabetic peripheral neuropathy  Diabetes and Foot Care Diabetes may cause you to have problems because of poor blood supply (circulation) to your feet and legs. This may cause the skin on your feet to become thinner, break easier, and heal more slowly. Your skin may become dry, and the skin may peel and crack. You may also have nerve damage in your legs and feet causing decreased feeling in them. You may not notice minor injuries to your feet that could lead to infections or more serious problems. Taking care of your feet is one of the most important things you can do for yourself.  HOME CARE INSTRUCTIONS  Wear shoes at all times, even in the house. Do not go barefoot. Bare feet are easily injured.  Check your feet daily for blisters, cuts, and redness. If you cannot see the bottom of your feet, use a mirror or ask someone for help.  Wash your feet with warm water (do not use hot water) and mild soap. Then pat your feet and the areas between your toes until they are completely dry. Do not soak your feet as this can dry your skin.  Apply a moisturizing lotion or petroleum jelly (that does not contain alcohol and is unscented) to the skin on your feet and to dry, brittle toenails. Do not apply lotion between your toes.  Trim your toenails straight across. Do not dig under them or around the cuticle. File the edges of your nails with an emery board or nail file.  Do not cut corns or calluses or try to remove them with medicine.  Wear clean socks or stockings every day. Make sure they are not too tight. Do not wear knee-high stockings since they may decrease blood flow to your legs.  Wear shoes that fit properly and have enough cushioning. To break in new shoes, wear them for just a few hours a day. This prevents you from injuring your feet. Always look in your shoes before you put them on to be sure there are no objects inside.  Do  not cross your legs. This may decrease the blood flow to your feet.  If you find a minor scrape, cut, or break in the skin on your feet, keep it and the skin around it clean and dry. These areas may be cleansed with mild soap and water. Do not cleanse the area with peroxide, alcohol, or iodine.  When you remove an adhesive bandage, be sure not to damage the skin around it.  If you have a wound, look at it several times a day to make sure it is healing.  Do not use heating pads or hot water bottles. They may burn your skin. If you have lost feeling in your feet or legs, you may not know it is happening until it is too late.  Make sure your health care provider performs a complete foot exam at least annually or more often if you have foot problems. Report any cuts, sores, or bruises to your health care provider immediately. SEEK MEDICAL CARE IF:   You have an injury that is not healing.  You have cuts or breaks in the skin.  You have an ingrown nail.  You notice redness on your legs or feet.  You feel burning or tingling in your legs or feet.  You have pain or cramps in your legs and feet.  Your legs or feet are numb.  Your feet always  feel cold. SEEK IMMEDIATE MEDICAL CARE IF:   There is increasing redness, swelling, or pain in or around a wound.  There is a red line that goes up your leg.  Pus is coming from a wound.  You develop a fever or as directed by your health care provider.  You notice a bad smell coming from an ulcer or wound. Document Released: 09/18/2000 Document Revised: 05/24/2013 Document Reviewed: 02/28/2013 [name] Patient Information 2015 [place], [place]. This information is not intended to replace advice given to you by your health care provider. Make sure you discuss any questions you have with your health care provider.

## 2014-10-09 ENCOUNTER — Encounter: Payer: Self-pay | Admitting: Podiatry

## 2014-10-12 ENCOUNTER — Encounter: Payer: Self-pay | Admitting: Family Medicine

## 2014-10-12 ENCOUNTER — Ambulatory Visit (INDEPENDENT_AMBULATORY_CARE_PROVIDER_SITE_OTHER): Payer: BC Managed Care – PPO | Admitting: Family Medicine

## 2014-10-12 VITALS — BP 118/80 | HR 116 | Ht 69.0 in | Wt 230.0 lb

## 2014-10-12 DIAGNOSIS — G609 Hereditary and idiopathic neuropathy, unspecified: Secondary | ICD-10-CM

## 2014-10-12 NOTE — Patient Instructions (Addendum)
Good to see you  Consider B6 200mg  daily Continue the gabapentin where you are at.  Sertraline make sure your mood doesn't change.  New balance greater then 700 would be good.  See me when you need me.   I think your knee and shoulder is at 85% and at this time likely this is your baseline.

## 2014-10-12 NOTE — Progress Notes (Signed)
  [facility] [name], [place] [address] Phone: [phone] Subjective:     CC: Bilateral feet pain and numbness  UPJ:[id number] Eric Price is a 64 y.o. male coming in with complaint of Bilateral feet pain and numbness.    Patient is a diabetic as well as getting radiation a fairly regular basis. Patient states that this is more in his toes in the forefoot bilaterally. Patient feels like there is almost with glove over his toes. Patient is taking 300 mg of gabapentin at night and at last visit patient was added onto gabapentin 100 mg during the day. Patient was given exercises and we discussed icing. Patient states overall he is doing much better. Patient states that he still has some mild numbness when he is walking. Patient states that the increasing gabapentin has helped out tremendously. Patient is overall doing relatively well and this is not stopping him from any activities. Patient continues to radiation and is trying very hard to get his diabetes under control.     Past medical history, social, surgical and family history all reviewed in electronic medical record.   Review of Systems: No headache, visual changes, nausea, vomiting, diarrhea, constipation, dizziness, abdominal pain, skin rash, fevers, chills, night sweats, weight loss, swollen lymph nodes, body aches, joint swelling, muscle aches, chest pain, shortness of breath, mood changes.   Objective Vitals reviewed   General: No apparent distress alert and oriented x3 mood and affect normal, dressed appropriately. Obese.  HEENT: Pupils equal, extraocular movements intact  Respiratory: Patient's speak in full sentences and does not appear short of breath  Cardiovascular: No lower extremity edema, non tender, no erythema  Skin: Warm dry intact with no signs of infection or rash on extremities or on axial skeleton.  Abdomen: Soft nontender  Neuro: Cranial nerves II through XII are intact,  neurovascularly intact in all extremities with 2+ DTRs and 2+ pulses.  Lymph: No lymphadenopathy of posterior or anterior cervical chain or axillae bilaterally.  Gait normal with good balance and coordination.  MSK:  Non tender with full range of motion and good stability and symmetric strength and tone of  elbows, wrist, hip, and ankles bilaterally.    Foot exam shows the patient does have mild fibular deviation of the large toe bilaterally. Mild hallux rigidus bilaterally. Patient does have actually a pes cavus bilaterally. No significant breakdown noted. Neurovascularly he does seem to be intact today. Good capillary refill. No swelling and no redness noted. No skin breakdown.       Impression and Recommendations:     This case required medical decision making of moderate complexity.

## 2014-10-12 NOTE — Assessment & Plan Note (Signed)
Patient overall is doing significantly better. I do not want to increase his gabapentin because it can make him somewhat fatigued especially with his other treatments. We discussed continuing the home exercises and proper shoe wear. Patient was given guidance on picking shoes. Lungs patient continues to do well he can follow-up on an as-needed basis.

## 2014-11-02 ENCOUNTER — Telehealth: Payer: Self-pay | Admitting: Family Medicine

## 2014-11-02 MED ORDER — INSULIN LISPRO 100 UNIT/ML (KWIKPEN): 5.0000 [IU] | PEN_INJECTOR | Freq: Three times a day (TID) | SUBCUTANEOUS | Status: DC

## 2014-11-02 NOTE — Telephone Encounter (Signed)
PA for Novolog was denied.  Humalog is the preferred medication patient must try and fail first.

## 2014-11-02 NOTE — Telephone Encounter (Signed)
Left patient 2 messages on VM for patient to return. Pt return call, informed patient. Rx sent to pharmacy.

## 2014-11-02 NOTE — Telephone Encounter (Signed)
Patient states he is okay to take Humalog.  [facility] - [place], [place] - [address]

## 2014-11-05 ENCOUNTER — Ambulatory Visit (AMBULATORY_SURGERY_CENTER): Payer: Self-pay | Admitting: *Deleted

## 2014-11-05 VITALS — Ht 69.0 in | Wt 230.6 lb

## 2014-11-05 DIAGNOSIS — Z1211 Encounter for screening for malignant neoplasm of colon: Secondary | ICD-10-CM

## 2014-11-05 MED ORDER — NA SULFATE-K SULFATE-MG SULF 17.5-3.13-1.6 GM/177ML PO SOLN: 1.0000 | Freq: Once | ORAL | Status: DC

## 2014-11-05 NOTE — Progress Notes (Signed)
Denies allergies to eggs or soy products. Denies complications with sedation or anesthesia. Denies O2 use. Denies use of diet or weight loss medications.  Emmi instructions given for colonoscopy.  

## 2014-11-19 ENCOUNTER — Encounter: Payer: Self-pay | Admitting: Gastroenterology

## 2014-11-19 ENCOUNTER — Ambulatory Visit (AMBULATORY_SURGERY_CENTER): Payer: 59 | Admitting: Gastroenterology

## 2014-11-19 VITALS — BP 136/81 | HR 92 | Temp 97.4°F | Resp 21 | Ht 69.0 in | Wt 230.0 lb

## 2014-11-19 DIAGNOSIS — Z1211 Encounter for screening for malignant neoplasm of colon: Secondary | ICD-10-CM

## 2014-11-19 LAB — GLUCOSE, CAPILLARY
GLUCOSE-CAPILLARY: 112 mg/dL — AB (ref 70–99)
Glucose-Capillary: 105 mg/dL — ABNORMAL HIGH (ref 70–99)

## 2014-11-19 MED ORDER — SODIUM CHLORIDE 0.9 % IV SOLN: 500.0000 mL | INTRAVENOUS | Status: DC

## 2014-11-19 NOTE — Patient Instructions (Addendum)
YOU HAD AN ENDOSCOPIC PROCEDURE TODAY AT THE [facility]: Refer to the procedure report that was given to you for any specific questions about what was found during the examination.  If the procedure report does not answer your questions, please call your gastroenterologist to clarify.  If you requested that your care partner not be given the details of your procedure findings, then the procedure report has been included in a sealed envelope for you to review at your convenience later.  YOU SHOULD EXPECT: Some feelings of bloating in the abdomen. Passage of more gas than usual.  Walking can help get rid of the air that was put into your GI tract during the procedure and reduce the bloating. If you had a lower endoscopy (such as a colonoscopy or flexible sigmoidoscopy) you may notice spotting of blood in your stool or on the toilet paper. If you underwent a bowel prep for your procedure, then you may not have a normal bowel movement for a few days.  DIET: Your first meal following the procedure should be a light meal and then it is ok to progress to your normal diet.  A half-sandwich or bowl of soup is an example of a good first meal.  Heavy or fried foods are harder to digest and may make you feel nauseous or bloated.  Likewise meals heavy in dairy and vegetables can cause extra gas to form and this can also increase the bloating.  Drink plenty of fluids but you should avoid alcoholic beverages for 24 hours.  ACTIVITY: Your care partner should take you home directly after the procedure.  You should plan to take it easy, moving slowly for the rest of the day.  You can resume normal activity the day after the procedure however you should NOT DRIVE or use heavy machinery for 24 hours (because of the sedation medicines used during the test).    SYMPTOMS TO REPORT IMMEDIATELY: A gastroenterologist can be reached at any hour.  During normal business hours, 8:30 AM to 5:00 PM Monday through Friday,  call [phone].  After hours and on weekends, please call the GI answering service at [phone] who will take a message and have the physician on call contact you.   Following lower endoscopy (colonoscopy or flexible sigmoidoscopy):  Excessive amounts of blood in the stool  Significant tenderness or worsening of abdominal pains  Swelling of the abdomen that is new, acute  Fever of 100F or higher  FOLLOW UP: If any biopsies were taken you will be contacted by phone or by letter within the next 1-3 weeks.  Call your gastroenterologist if you have not heard about the biopsies in 3 weeks.  Our staff will call the home number listed on your records the next business day following your procedure to check on you and address any questions or concerns that you may have at that time regarding the information given to you following your procedure. This is a courtesy call and so if there is no answer at the home number and we have not heard from you through the emergency physician on call, we will assume that you have returned to your regular daily activities without incident.  SIGNATURES/CONFIDENTIALITY: You and/or your care partner have signed paperwork which will be entered into your electronic medical record.  These signatures attest to the fact that that the information above on your After Visit Summary has been reviewed and is understood.  Full responsibility of the confidentiality of this   discharge information lies with you and/or your care-partner.  Normal colonoscopy.  High fiber diet recommended.  Recall colonoscopy in 10 years.

## 2014-11-19 NOTE — Progress Notes (Signed)
A/ox3, pleased with MAC, report to RN 

## 2014-11-19 NOTE — Op Note (Addendum)
[facility]  [address] [place], [address]   COLONOSCOPY PROCEDURE REPORT     EXAM DATE: 12-Dec-2014  PATIENT NAME:      Eric Price, Eric Price           MR #:      [id number] BIRTHDATE:       Mar 11, 1951      VISIT #:     [id number] ATTENDING:     Inda Castle, MD     STATUS:     outpatient REFERRING MD:      Carolann Littler, M.D. ASA CLASS:        Class II  INDICATIONS:  The patient is a 64 yr old male here for a colonoscopy due to average risk patient for colon cancer. PROCEDURE PERFORMED:     Colonoscopy, screening  .  first screening colonoscopy. MEDICATIONS:     Monitored anesthesia care and Propofol 300 mg IV  ESTIMATED BLOOD LOSS:     None  CONSENT: The patient understands the risks and benefits of the procedure and understands that these risks include, but are not limited to: sedation, allergic reaction, infection, perforation and/or bleeding. Alternative means of evaluation and treatment include, among others: physical exam, x-rays, and/or surgical intervention. The patient elects to proceed with this endoscopic procedure.  DESCRIPTION OF PROCEDURE: During intra-op preparation period all mechanical & medical equipment was checked for proper function. Hand hygiene and appropriate measures for infection prevention was taken. After the risks, benefits and alternatives of the procedure were thoroughly explained, Informed consent was verified, confirmed and timeout was successfully executed by the treatment team. A digital exam revealed no abnormalities of the rectum.      The LB [id number] endoscope was introduced through the anus and advanced to the cecum, which was identified by both the appendix and ileocecal valve. No adverse events experienced. The prep was fair,  Suprep fair. The instrument was then slowly withdrawn as the colon was fully examined.   COLON FINDINGS: A normal appearing cecum, ileocecal valve, and appendiceal orifice were  identified.  The ascending, transverse, descending, sigmoid colon, and rectum appeared unremarkable. Retroflexed views revealed no abnormalities.  The scope was then completely withdrawn from the patient and the procedure terminated.  WITHDRAWAL TIME: 9 minutes 45 seconds    ADVERSE EVENTS:      There were no complications. IMPRESSIONS:     Normal colonoscopy  RECOMMENDATIONS:     High fiber diet Recall colonoscopy in 10 years RECALL:  Inda Castle, MD eSigned:  Inda Castle, MD 12/12/2014 12:07 PM Revised: Dec 12, 2014 12:07 PM  cc:  CPT CODES: ICD CODES:  The ICD and CPT codes recommended by this software are interpretations from the data that the clinical staff has captured with the software.  The verification of the translation of this report to the ICD and CPT codes and modifiers is the sole responsibility of the health care institution and practicing physician where this report was generated.  [employer] will not be held responsible for the validity of the ICD and CPT codes included on this report.  AMA assumes no liability for data contained or not contained herein. [employer] is a Designer, television/film set of the [employer].   PATIENT NAME:  Eric Price MR#: [id number]

## 2014-11-19 NOTE — Progress Notes (Signed)
Pts abdomen distended and only passing small amounts of air.  Pt is "uncomfortable".  Had pt turn side to side and he was given 2 Levsin SL.

## 2014-11-20 ENCOUNTER — Telehealth: Payer: Self-pay

## 2014-11-20 NOTE — Telephone Encounter (Signed)
  Follow up Call-  Call back number 11/19/2014  Post procedure Call Back phone  # [phone]  Permission to leave phone message Yes     Patient questions:  Do you have a fever, pain , or abdominal swelling? No. Pain Score  0 *  Have you tolerated food without any problems? Yes.    Have you been able to return to your normal activities? Yes.    Do you have any questions about your discharge instructions: Diet   No. Medications  No. Follow up visit  No.  Do you have questions or concerns about your Care? No.  Actions: * If pain score is 4 or above: No action needed, pain <4.

## 2014-11-24 ENCOUNTER — Other Ambulatory Visit: Payer: Self-pay | Admitting: Family Medicine

## 2014-12-04 ENCOUNTER — Encounter: Payer: Self-pay | Admitting: Family Medicine

## 2014-12-04 ENCOUNTER — Ambulatory Visit (INDEPENDENT_AMBULATORY_CARE_PROVIDER_SITE_OTHER): Payer: 59 | Admitting: Family Medicine

## 2014-12-04 VITALS — BP 110/84 | HR 127 | Temp 98.1°F | Ht 69.0 in | Wt 223.9 lb

## 2014-12-04 DIAGNOSIS — M79675 Pain in left toe(s): Secondary | ICD-10-CM

## 2014-12-04 NOTE — Patient Instructions (Signed)
Warm salt water soaks for 5 minutes 2 times daily for a few days  Apply the ointment as shown twice daily for 5 days  Follow up with foot doctor if worsens or persists
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
## 2014-12-04 NOTE — Progress Notes (Signed)
Pre visit review using our clinic review tool, if applicable. No additional management support is needed unless otherwise documented below in the visit note. 

## 2014-12-19 ENCOUNTER — Ambulatory Visit (INDEPENDENT_AMBULATORY_CARE_PROVIDER_SITE_OTHER): Payer: 59 | Admitting: Family Medicine

## 2014-12-19 ENCOUNTER — Other Ambulatory Visit: Payer: Self-pay | Admitting: Family Medicine

## 2014-12-19 ENCOUNTER — Encounter: Payer: Self-pay | Admitting: Family Medicine

## 2014-12-19 VITALS — BP 130/80 | HR 105 | Temp 97.5°F | Wt 225.0 lb

## 2014-12-19 DIAGNOSIS — E1165 Type 2 diabetes mellitus with hyperglycemia: Secondary | ICD-10-CM

## 2014-12-19 DIAGNOSIS — IMO0002 Reserved for concepts with insufficient information to code with codable children: Secondary | ICD-10-CM

## 2014-12-19 DIAGNOSIS — L600 Ingrowing nail: Secondary | ICD-10-CM

## 2014-12-19 DIAGNOSIS — F3341 Major depressive disorder, recurrent, in partial remission: Secondary | ICD-10-CM

## 2014-12-19 LAB — HEMOGLOBIN A1C: Hgb A1c MFr Bld: 8.6 % — ABNORMAL HIGH (ref 4.6–6.5)

## 2014-12-19 NOTE — Progress Notes (Signed)
Pre visit review using our clinic review tool, if applicable. No additional management support is needed unless otherwise documented below in the visit note. 

## 2014-12-19 NOTE — Patient Instructions (Signed)
Titrate Levemir up 2 units every 3 days until fasting sugars are consistently < 130
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
## 2014-12-20 ENCOUNTER — Telehealth: Payer: Self-pay | Admitting: Family Medicine

## 2014-12-20 NOTE — Telephone Encounter (Signed)
Let's see if we can get for 30 gms/30 days

## 2014-12-20 NOTE — Telephone Encounter (Signed)
Refill done.  

## 2014-12-20 NOTE — Telephone Encounter (Signed)
I received a PA request for Clobetasol and it exceeds the quantity limit for the patient's plan.  Patient's plan allows 15 g per 30 days.  Please advise if the quantity on the RX can be changed to 15 g or if you want me to submit a quantity limit exception request.

## 2014-12-21 MED ORDER — CLOBETASOL PROPIONATE 0.05 % EX CREA: TOPICAL_CREAM | CUTANEOUS | Status: DC

## 2014-12-21 NOTE — Telephone Encounter (Signed)
Changed to 30gm. Sent Rx to pharmacy

## 2014-12-21 NOTE — Telephone Encounter (Signed)
I was able to get the 30 g APPROVED for 1 year.  [id number]

## 2014-12-25 DIAGNOSIS — C8400 Mycosis fungoides, unspecified site: Secondary | ICD-10-CM | POA: Diagnosis not present

## 2015-01-09 ENCOUNTER — Encounter: Payer: Self-pay | Admitting: Podiatry

## 2015-01-09 ENCOUNTER — Ambulatory Visit (INDEPENDENT_AMBULATORY_CARE_PROVIDER_SITE_OTHER): Payer: 59 | Admitting: Podiatry

## 2015-01-09 VITALS — BP 161/81 | HR 75 | Resp 16

## 2015-01-09 DIAGNOSIS — L600 Ingrowing nail: Secondary | ICD-10-CM

## 2015-01-09 NOTE — Patient Instructions (Signed)

## 2015-01-10 NOTE — Progress Notes (Signed)
Subjective:     Patient ID: Eric Price, male   DOB: 01/31/1951, 64 y.o.   MRN: [id number]  HPI patient states my left big toe is ingrown on the corner and I have tried to trim it and soak it without relief and I like it fixed. My sugar has been running well and typically between 100 and 150 at this time   Review of Systems     Objective:   Physical Exam Neurovascular status was found to be intact with muscle strength adequate and range of motion within normal limits. Patient's noted to have good digital perfusion and hair growth and on the left hallux medial border is incurvated and tender when pressed with distal redness but no drainage    Assessment:     Ingrown toenail deformity left hallux medial border with pain    Plan:     Reviewed nail condition and risk associated with correction. Patient would like this fixed understanding risk and today I infiltrated 60 Milligan Xylocaine Marcaine mixture and remove the medial corner exposing matrix and applied phenol 3 applications followed by alcohol lavage and sterile dressing. Gave instructions on soaks and reappoint

## 2015-01-15 ENCOUNTER — Encounter: Payer: Self-pay | Admitting: Podiatry

## 2015-01-15 ENCOUNTER — Ambulatory Visit (INDEPENDENT_AMBULATORY_CARE_PROVIDER_SITE_OTHER): Payer: 59 | Admitting: Podiatry

## 2015-01-15 VITALS — BP 157/104 | HR 113

## 2015-01-15 DIAGNOSIS — L03032 Cellulitis of left toe: Secondary | ICD-10-CM

## 2015-01-15 DIAGNOSIS — L03012 Cellulitis of left finger: Secondary | ICD-10-CM

## 2015-01-15 MED ORDER — HYDROCODONE-ACETAMINOPHEN 10-325 MG PO TABS: 1.0000 | ORAL_TABLET | Freq: Three times a day (TID) | ORAL | Status: DC | PRN

## 2015-01-15 MED ORDER — CEPHALEXIN 500 MG PO CAPS: 500.0000 mg | ORAL_CAPSULE | Freq: Two times a day (BID) | ORAL | Status: DC

## 2015-01-16 NOTE — Progress Notes (Signed)
Subjective:     Patient ID: Eric Price, male   DOB: 1951/06/12, 64 y.o.   MRN: [id number]  HPI patient presents stating I was just nervous because I have an ingrown toenail removed and there is some drainage and it and some redness and I have diabetes and I don't want her to be a problem   Review of Systems     Objective:   Physical Exam Neurovascular status intact with no change in health history and noted to have healing nails site left hallux with redness around the proximal portion but no indications of proximal drainage edema or erythema noted    Assessment:     Paronychia infection left hallux localized in nature    Plan:     Continue soaks and his precautionary measure placed him on antibiotics and advised him of how to take these properly. Explained to him risk associated with this and if any proximal edema erythema or drainage were to occur or systemic signs of infection he is to let us know immediately

## 2015-01-21 ENCOUNTER — Other Ambulatory Visit: Payer: Self-pay | Admitting: Family Medicine

## 2015-01-21 NOTE — Telephone Encounter (Signed)
Refill done.  

## 2015-01-22 ENCOUNTER — Encounter: Payer: Self-pay | Admitting: Family Medicine

## 2015-01-22 ENCOUNTER — Ambulatory Visit (INDEPENDENT_AMBULATORY_CARE_PROVIDER_SITE_OTHER): Payer: 59 | Admitting: Family Medicine

## 2015-01-22 VITALS — BP 130/86 | Temp 97.5°F | Wt 229.0 lb

## 2015-01-22 DIAGNOSIS — L03032 Cellulitis of left toe: Secondary | ICD-10-CM | POA: Diagnosis not present

## 2015-01-22 DIAGNOSIS — E119 Type 2 diabetes mellitus without complications: Secondary | ICD-10-CM

## 2015-01-22 DIAGNOSIS — M5416 Radiculopathy, lumbar region: Secondary | ICD-10-CM

## 2015-01-22 DIAGNOSIS — E1165 Type 2 diabetes mellitus with hyperglycemia: Secondary | ICD-10-CM

## 2015-01-22 DIAGNOSIS — J069 Acute upper respiratory infection, unspecified: Secondary | ICD-10-CM | POA: Diagnosis not present

## 2015-01-22 MED ORDER — DOXYCYCLINE HYCLATE 100 MG PO CAPS: 100.0000 mg | ORAL_CAPSULE | Freq: Two times a day (BID) | ORAL | Status: DC
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
## 2015-01-22 NOTE — Patient Instructions (Signed)
Increase Levemir 2 units every 2 days until fasting sugars in ballpark of 130.

## 2015-01-22 NOTE — Progress Notes (Signed)
Pre visit review using our clinic review tool, if applicable. No additional management support is needed unless otherwise documented below in the visit note. 

## 2015-01-23 ENCOUNTER — Telehealth: Payer: Self-pay

## 2015-01-23 ENCOUNTER — Telehealth: Payer: Self-pay | Admitting: Family Medicine

## 2015-01-23 NOTE — Telephone Encounter (Signed)
Pt stated No. And that he would try that first.

## 2015-01-23 NOTE — Telephone Encounter (Signed)
Called stating that you were suppose to send in a Rx for his cough.

## 2015-01-23 NOTE — Telephone Encounter (Signed)
NO.  We discussed the fact that if he has Morphine allergy hydrocodone cough syrups would not be a safe option.  Has he tried Delsym?

## 2015-01-29 ENCOUNTER — Ambulatory Visit (INDEPENDENT_AMBULATORY_CARE_PROVIDER_SITE_OTHER): Payer: 59 | Admitting: Family Medicine

## 2015-01-29 ENCOUNTER — Encounter: Payer: Self-pay | Admitting: Family Medicine

## 2015-01-29 VITALS — BP 146/98 | HR 102 | Temp 97.6°F | Ht 69.0 in | Wt 225.0 lb

## 2015-01-29 DIAGNOSIS — L03032 Cellulitis of left toe: Secondary | ICD-10-CM | POA: Diagnosis not present

## 2015-01-29 NOTE — Progress Notes (Signed)
Pre visit review using our clinic review tool, if applicable. No additional management support is needed unless otherwise documented below in the visit note. 

## 2015-01-29 NOTE — Patient Instructions (Signed)
Continue salt water soaks twice daily Follow up immediately for any increased redness, warmth, fever, or chills.
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
## 2015-02-02 ENCOUNTER — Ambulatory Visit (INDEPENDENT_AMBULATORY_CARE_PROVIDER_SITE_OTHER): Payer: 59 | Admitting: Internal Medicine

## 2015-02-02 ENCOUNTER — Encounter: Payer: Self-pay | Admitting: Internal Medicine

## 2015-02-02 VITALS — BP 120/82 | Temp 97.6°F | Wt 228.0 lb

## 2015-02-02 DIAGNOSIS — M79672 Pain in left foot: Secondary | ICD-10-CM | POA: Insufficient documentation

## 2015-02-02 MED ORDER — MUPIROCIN CALCIUM 2 % EX CREA: 1.0000 "application " | TOPICAL_CREAM | Freq: Three times a day (TID) | CUTANEOUS | Status: DC
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
## 2015-02-02 NOTE — Assessment & Plan Note (Signed)
Does not appear to be infected but will have him put mupirocin ointment on it since it is open and I am concerned about vascular compromise Will set up vascular testing May just be worsening of prior very mild neuropathy though

## 2015-02-02 NOTE — Progress Notes (Signed)
Pre visit review using our clinic review tool, if applicable. No additional management support is needed unless otherwise documented below in the visit note. 

## 2015-02-04 ENCOUNTER — Telehealth: Payer: Self-pay | Admitting: Family Medicine

## 2015-02-04 NOTE — Telephone Encounter (Signed)
Pt is call back to report he can not walk

## 2015-02-04 NOTE — Telephone Encounter (Signed)
Patient states he would like a referral to see a specialist for his left big toe, to check "nerve ending".  He was seen at our Saturday clinic for his toe.

## 2015-02-05 NOTE — Telephone Encounter (Signed)
Pt informed. Pt would like to know if he can get another Rx for doxycycline and a Rx for pain he is not able to sleep at night.

## 2015-02-05 NOTE — Telephone Encounter (Signed)
Agree with Dr Alla German suggestion to check arterial dopplers- and these have been ordered.  Would check on status of that order and hopefully can get ASAP.

## 2015-02-06 ENCOUNTER — Ambulatory Visit (INDEPENDENT_AMBULATORY_CARE_PROVIDER_SITE_OTHER): Payer: 59 | Admitting: Podiatry

## 2015-02-06 ENCOUNTER — Encounter: Payer: Self-pay | Admitting: Podiatry

## 2015-02-06 ENCOUNTER — Ambulatory Visit (INDEPENDENT_AMBULATORY_CARE_PROVIDER_SITE_OTHER): Payer: 59

## 2015-02-06 VITALS — BP 125/91 | HR 113 | Resp 15

## 2015-02-06 DIAGNOSIS — E0849 Diabetes mellitus due to underlying condition with other diabetic neurological complication: Secondary | ICD-10-CM

## 2015-02-06 DIAGNOSIS — L600 Ingrowing nail: Secondary | ICD-10-CM | POA: Diagnosis not present

## 2015-02-06 DIAGNOSIS — M79675 Pain in left toe(s): Secondary | ICD-10-CM

## 2015-02-06 MED ORDER — OXYCODONE-ACETAMINOPHEN 10-325 MG PO TABS
1.0000 | ORAL_TABLET | Freq: Three times a day (TID) | ORAL | Status: DC | PRN
Start: 2015-02-06 — End: 2015-02-25

## 2015-02-06 MED ORDER — DOXYCYCLINE HYCLATE 100 MG PO CAPS
100.0000 mg | ORAL_CAPSULE | Freq: Two times a day (BID) | ORAL | Status: DC
Start: 2015-02-06 — End: 2015-02-25

## 2015-02-06 MED ORDER — TRAMADOL HCL 50 MG PO TABS: 50.0000 mg | ORAL_TABLET | Freq: Four times a day (QID) | ORAL | Status: DC | PRN

## 2015-02-06 NOTE — Progress Notes (Signed)
Subjective:     Patient ID: Eric Price, male   DOB: [birth year], 64 y.o.   MRN: [id number]  HPI patient states my toe has healed well from having than ingrown toenail fixed but I'm getting a lot of pain in my leg and in the my hip and toe and it makes it hard at times to sleep at night   Review of Systems     Objective:   Physical Exam Neurovascular status unchanged but I definitely noted diminishment of pulses DP PT on the left side with some coolness to the lesser digits which was not present at previous visit    Assessment:     Circulatory issues left which may be getting gradually worse over time    Plan:     Reviewed condition with patient at this time her ordering stat circulatory studies left with possibility for vascular disease and reperfusion which may be necessary. I did place on Percocet to try to control the pain the patient experiences at night and gave instructions on usage and not to use during the day. We will see patient back when the results of the studies. The nail itself has healed well from previous ingrown surgery and that does not appear to be where the problem is coming from as it's really more in the leg and the entire toe

## 2015-02-06 NOTE — Telephone Encounter (Signed)
Refill Doxycycline.  We need to avoid regular Hydrocodone- he already received from podiatry- to avoid dependency.  Try Ultram 50 mg 1-2 po q 6 hours prn pain #60.

## 2015-02-06 NOTE — Telephone Encounter (Signed)
Rx sent to pharmacy   

## 2015-02-08 ENCOUNTER — Telehealth: Payer: Self-pay | Admitting: Podiatry

## 2015-02-08 ENCOUNTER — Telehealth (HOSPITAL_COMMUNITY): Payer: Self-pay | Admitting: *Deleted

## 2015-02-08 ENCOUNTER — Encounter: Payer: Self-pay | Admitting: Family Medicine

## 2015-02-08 ENCOUNTER — Ambulatory Visit: Payer: 59 | Admitting: Family Medicine

## 2015-02-08 ENCOUNTER — Ambulatory Visit (INDEPENDENT_AMBULATORY_CARE_PROVIDER_SITE_OTHER): Payer: 59 | Admitting: Family Medicine

## 2015-02-08 VITALS — BP 110/76 | HR 107 | Temp 97.7°F | Ht 69.0 in | Wt 228.0 lb

## 2015-02-08 DIAGNOSIS — M79605 Pain in left leg: Secondary | ICD-10-CM | POA: Diagnosis not present

## 2015-02-08 DIAGNOSIS — M79675 Pain in left toe(s): Secondary | ICD-10-CM

## 2015-02-08 DIAGNOSIS — R112 Nausea with vomiting, unspecified: Secondary | ICD-10-CM

## 2015-02-08 LAB — BASIC METABOLIC PANEL
BUN: 16 mg/dL (ref 6–23)
CHLORIDE: 99 meq/L (ref 96–112)
CO2: 29 mEq/L (ref 19–32)
Calcium: 9.9 mg/dL (ref 8.4–10.5)
Creat: 1.16 mg/dL (ref 0.50–1.35)
GLUCOSE: 111 mg/dL — AB (ref 70–99)
POTASSIUM: 5 meq/L (ref 3.5–5.3)
Sodium: 138 mEq/L (ref 135–145)

## 2015-02-08 LAB — HEPATIC FUNCTION PANEL
ALBUMIN: 4.2 g/dL (ref 3.5–5.2)
ALK PHOS: 71 U/L (ref 39–117)
ALT: 33 U/L (ref 0–53)
AST: 19 U/L (ref 0–37)
BILIRUBIN DIRECT: 0.1 mg/dL (ref 0.0–0.3)
Indirect Bilirubin: 0.3 mg/dL (ref 0.2–1.2)
TOTAL PROTEIN: 6.9 g/dL (ref 6.0–8.3)
Total Bilirubin: 0.4 mg/dL (ref 0.2–1.2)

## 2015-02-08 LAB — LIPASE

## 2015-02-08 NOTE — Patient Instructions (Signed)
Increase gabapentin to 400 mg at night-300 mg tablet plus one 100 mg tablet You should be getting call regarding arterial Doppler study soon

## 2015-02-08 NOTE — Progress Notes (Signed)
Pre visit review using our clinic review tool, if applicable. No additional management support is needed unless otherwise documented below in the visit note. 

## 2015-02-08 NOTE — Telephone Encounter (Signed)
Please call him Monday Morning
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
## 2015-02-12 ENCOUNTER — Telehealth: Payer: Self-pay | Admitting: Family Medicine

## 2015-02-12 ENCOUNTER — Ambulatory Visit (HOSPITAL_COMMUNITY)
Admission: RE | Admit: 2015-02-12 | Discharge: 2015-02-12 | Disposition: A | Discharge status: Home / Self Care | Payer: 59 | Source: Ambulatory Visit | Attending: Podiatry | Admitting: Podiatry

## 2015-02-12 DIAGNOSIS — M79672 Pain in left foot: Secondary | ICD-10-CM

## 2015-02-12 DIAGNOSIS — E0849 Diabetes mellitus due to underlying condition with other diabetic neurological complication: Secondary | ICD-10-CM | POA: Diagnosis not present

## 2015-02-12 DIAGNOSIS — L97529 Non-pressure chronic ulcer of other part of left foot with unspecified severity: Secondary | ICD-10-CM | POA: Diagnosis not present

## 2015-02-12 DIAGNOSIS — M79675 Pain in left toe(s): Secondary | ICD-10-CM | POA: Insufficient documentation

## 2015-02-12 NOTE — Telephone Encounter (Signed)
Pt had ultrasound on legs today and still still in lots of pain. Pt would lke a cb to see what can be done.

## 2015-02-13 NOTE — Telephone Encounter (Signed)
Pt informed. As soon as we get the results. I will give him a call back.

## 2015-02-13 NOTE — Telephone Encounter (Signed)
We must get results of arterial Doppler first and see what the next appropriate intervention will be. If he has significant peripheral artery disease will need to see vascular surgery. Hopefully we will have those results back soon.

## 2015-02-14 ENCOUNTER — Telehealth: Payer: Self-pay

## 2015-02-14 NOTE — Telephone Encounter (Signed)
Pt called stating that he is in pain and has not been able to sleep. He would like to know his results. Informed patient that as soon as we get the results he would be informed. Pt states that he may just go to the hospital.

## 2015-02-14 NOTE — Telephone Encounter (Signed)
Pain medication. Pt states that he can take hydrocodone and he would also like another round of antibiotic.

## 2015-02-15 ENCOUNTER — Telehealth: Payer: Self-pay | Admitting: Podiatry

## 2015-02-15 ENCOUNTER — Telehealth: Payer: Self-pay | Admitting: *Deleted

## 2015-02-15 ENCOUNTER — Ambulatory Visit (INDEPENDENT_AMBULATORY_CARE_PROVIDER_SITE_OTHER): Payer: 59 | Admitting: Podiatry

## 2015-02-15 ENCOUNTER — Encounter: Payer: Self-pay | Admitting: Podiatry

## 2015-02-15 VITALS — BP 135/95 | HR 113 | Resp 15

## 2015-02-15 DIAGNOSIS — M79675 Pain in left toe(s): Secondary | ICD-10-CM | POA: Diagnosis not present

## 2015-02-15 DIAGNOSIS — I739 Peripheral vascular disease, unspecified: Secondary | ICD-10-CM

## 2015-02-15 MED ORDER — HYDROCODONE-ACETAMINOPHEN 5-325 MG PO TABS: 1.0000 | ORAL_TABLET | Freq: Four times a day (QID) | ORAL | Status: DC | PRN

## 2015-02-15 MED ORDER — OXYCODONE-ACETAMINOPHEN 10-325 MG PO TABS: 1.0000 | ORAL_TABLET | ORAL | Status: DC | PRN

## 2015-02-15 NOTE — Telephone Encounter (Signed)
PT CALLED ASKING ABOUT A GETTING A BOOT THAT HAS THE TOES OUT. PLEASE CALL HIM BACK

## 2015-02-15 NOTE — Addendum Note (Signed)
Addended by: Marcina Millard on: 02/15/2015 08:53 AM   Modules accepted: Orders

## 2015-02-15 NOTE — Telephone Encounter (Signed)
Pt states he had a toenail procedure 2 weeks ago and toe is swollen and he is diabetic.  Transferred pt to scheduler to appt for today.

## 2015-02-15 NOTE — Telephone Encounter (Signed)
We did not see evidence for further infection so would not recommend any further antibiotic at this time.  Vicodin 5/325 mg 1-2 po q 6 hours prn pain #40 with no refills.

## 2015-02-15 NOTE — Telephone Encounter (Signed)
Pt aware that Rx is ready for pick up 

## 2015-02-17 NOTE — Progress Notes (Signed)
Subjective:     Patient ID: Eric Price, male   DOB: [birth year], 64 y.o.   MRN: [id number]  HPI patient states my left forefoot has really been bothering me in the toes feel cold. It's worse at night and it really has fundamentally changed in the last couple weeks and it seems like  the ingrown was fixed is doing fine and healed well but I'm having a lot of pain.   Review of Systems     Objective:   Physical Exam I noted significant vascular change in the left foot from previous. It has definitely become cooler and digital perfusion has reduced. Patient did have a scaler study that I ordered done but we have not yet received results when he comes to the office. The nail site on the left hallux medial border has healed well and is crusted over with no current drainage but there is pain around that area    Assessment:     What appears to be an acute vascular issue of the left foot with all the digits been affected    Plan:     Reviewed condition with patient and did get results of vascular studies indicating they were not able to make good evaluations of the left forefoot and the left vessels entering the foot. It does appear that there is only one vessel runoff into the left lower leg and this is concerning and I contacted Dr. Alvester Chou immediately who was out of the [place] and we will talk on Monday concerning this condition. She is not in any other acute issues except for discomfort and we will continue utilizing Percocet to try to control his pain and elevation. Patient will be contacted immediately when I talk to Dr. Alvester Chou and we try to figure out what we can do to try to increase his circulation to the left foot is advised to call me if there should be any changes in his condition. His sugar has been running excellent between 100 120 and I've encouraged him to continue to keep those levels

## 2015-02-19 ENCOUNTER — Ambulatory Visit (INDEPENDENT_AMBULATORY_CARE_PROVIDER_SITE_OTHER): Payer: 59 | Admitting: Cardiovascular Disease

## 2015-02-19 ENCOUNTER — Encounter: Payer: Self-pay | Admitting: Cardiovascular Disease

## 2015-02-19 VITALS — BP 146/90 | HR 100 | Ht 69.0 in | Wt 225.2 lb

## 2015-02-19 DIAGNOSIS — Z01818 Encounter for other preprocedural examination: Secondary | ICD-10-CM

## 2015-02-19 DIAGNOSIS — Z01812 Encounter for preprocedural laboratory examination: Secondary | ICD-10-CM

## 2015-02-19 DIAGNOSIS — I998 Other disorder of circulatory system: Secondary | ICD-10-CM

## 2015-02-19 DIAGNOSIS — Z79899 Other long term (current) drug therapy: Secondary | ICD-10-CM | POA: Diagnosis not present

## 2015-02-19 DIAGNOSIS — I739 Peripheral vascular disease, unspecified: Secondary | ICD-10-CM

## 2015-02-19 DIAGNOSIS — I70229 Atherosclerosis of native arteries of extremities with rest pain, unspecified extremity: Secondary | ICD-10-CM | POA: Insufficient documentation

## 2015-02-19 LAB — BASIC METABOLIC PANEL
BUN: 8 mg/dL (ref 6–23)
CHLORIDE: 101 meq/L (ref 96–112)
CO2: 24 mEq/L (ref 19–32)
CREATININE: 0.99 mg/dL (ref 0.50–1.35)
Calcium: 9.1 mg/dL (ref 8.4–10.5)
Glucose, Bld: 239 mg/dL — ABNORMAL HIGH (ref 70–99)
Potassium: 4.6 mEq/L (ref 3.5–5.3)
Sodium: 136 mEq/L (ref 135–145)

## 2015-02-19 LAB — TSH: TSH: 0.557 u[IU]/mL (ref 0.350–4.500)

## 2015-02-19 LAB — CBC
HCT: 38.5 % — ABNORMAL LOW (ref 39.0–52.0)
Hemoglobin: 12.9 g/dL — ABNORMAL LOW (ref 13.0–17.0)
MCH: 27.3 pg (ref 26.0–34.0)
MCHC: 33.5 g/dL (ref 30.0–36.0)
MCV: 81.4 fL (ref 78.0–100.0)
MPV: 9.6 fL (ref 8.6–12.4)
Platelets: 369 10*3/uL (ref 150–400)
RBC: 4.73 MIL/uL (ref 4.22–5.81)
RDW: 13.2 % (ref 11.5–15.5)
WBC: 6.5 10*3/uL (ref 4.0–10.5)

## 2015-02-19 NOTE — Assessment & Plan Note (Signed)
Eric Price was referred to me by Dr. Beatrice Lecher AL for evaluation and treatment of chronic critical limb ischemia.he has a history of treated hypertension, diabetes and hyperlipidemia. He underwent a ingrown toenail procedure by Dr. 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 His only patent vessel with a peroneal. His foot is cold to palpation. I'm going to perform angiography potential intervention for limb salvage.

## 2015-02-19 NOTE — Patient Instructions (Signed)
Dr. Gwenlyn Found has ordered a peripheral angiogram to be done at [facility].  This procedure is going to look at the bloodflow in your lower extremities.  If Dr. Gwenlyn Found is able to open up the arteries, you will have to spend one night in the hospital.  If he is not able to open the arteries, you will be able to go home that same day.    After the procedure, you will not be allowed to drive for 3 days or push, pull, or lift anything greater than 10 lbs for one week.    You will be required to have the following tests prior to the procedure:  1. Blood work-the blood work can be done no more than 7 days prior to the procedure.  It can be done at any [facility] lab.  There is one downstairs on the first floor of this building and one in the [place] ([address])  2. Chest Xray-the chest xray order has already been placed at the [facility].     *REPS [employer]
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
## 2015-02-20 ENCOUNTER — Emergency Department (HOSPITAL_COMMUNITY): Payer: 59

## 2015-02-20 ENCOUNTER — Inpatient Hospital Stay (HOSPITAL_COMMUNITY)
Admission: EM | Admit: 2015-02-20 | Discharge: 2015-02-25 | DRG: 255 | Disposition: A | Discharge status: Home / Self Care | Payer: 59 | Attending: Internal Medicine | Admitting: Internal Medicine

## 2015-02-20 ENCOUNTER — Encounter (HOSPITAL_COMMUNITY): Payer: Self-pay | Admitting: Emergency Medicine

## 2015-02-20 DIAGNOSIS — M79672 Pain in left foot: Secondary | ICD-10-CM | POA: Diagnosis present

## 2015-02-20 DIAGNOSIS — Z01818 Encounter for other preprocedural examination: Secondary | ICD-10-CM

## 2015-02-20 DIAGNOSIS — E1165 Type 2 diabetes mellitus with hyperglycemia: Secondary | ICD-10-CM | POA: Diagnosis present

## 2015-02-20 DIAGNOSIS — R079 Chest pain, unspecified: Secondary | ICD-10-CM | POA: Diagnosis present

## 2015-02-20 DIAGNOSIS — L97529 Non-pressure chronic ulcer of other part of left foot with unspecified severity: Secondary | ICD-10-CM | POA: Diagnosis present

## 2015-02-20 DIAGNOSIS — M79675 Pain in left toe(s): Secondary | ICD-10-CM

## 2015-02-20 DIAGNOSIS — I429 Cardiomyopathy, unspecified: Secondary | ICD-10-CM | POA: Diagnosis present

## 2015-02-20 DIAGNOSIS — I82442 Acute embolism and thrombosis of left tibial vein: Secondary | ICD-10-CM | POA: Diagnosis not present

## 2015-02-20 DIAGNOSIS — F329 Major depressive disorder, single episode, unspecified: Secondary | ICD-10-CM | POA: Diagnosis present

## 2015-02-20 DIAGNOSIS — I743 Embolism and thrombosis of arteries of the lower extremities: Secondary | ICD-10-CM | POA: Diagnosis present

## 2015-02-20 DIAGNOSIS — E785 Hyperlipidemia, unspecified: Secondary | ICD-10-CM | POA: Diagnosis present

## 2015-02-20 DIAGNOSIS — Z7982 Long term (current) use of aspirin: Secondary | ICD-10-CM

## 2015-02-20 DIAGNOSIS — Z794 Long term (current) use of insulin: Secondary | ICD-10-CM

## 2015-02-20 DIAGNOSIS — I70229 Atherosclerosis of native arteries of extremities with rest pain, unspecified extremity: Secondary | ICD-10-CM

## 2015-02-20 DIAGNOSIS — E11621 Type 2 diabetes mellitus with foot ulcer: Secondary | ICD-10-CM | POA: Diagnosis present

## 2015-02-20 DIAGNOSIS — C8409 Mycosis fungoides, extranodal and solid organ sites: Secondary | ICD-10-CM | POA: Diagnosis present

## 2015-02-20 DIAGNOSIS — Z01812 Encounter for preprocedural laboratory examination: Secondary | ICD-10-CM

## 2015-02-20 DIAGNOSIS — I998 Other disorder of circulatory system: Secondary | ICD-10-CM | POA: Diagnosis present

## 2015-02-20 DIAGNOSIS — I739 Peripheral vascular disease, unspecified: Secondary | ICD-10-CM

## 2015-02-20 DIAGNOSIS — I248 Other forms of acute ischemic heart disease: Secondary | ICD-10-CM | POA: Diagnosis present

## 2015-02-20 DIAGNOSIS — L97509 Non-pressure chronic ulcer of other part of unspecified foot with unspecified severity: Secondary | ICD-10-CM | POA: Diagnosis present

## 2015-02-20 DIAGNOSIS — Z79899 Other long term (current) drug therapy: Secondary | ICD-10-CM

## 2015-02-20 DIAGNOSIS — I70212 Atherosclerosis of native arteries of extremities with intermittent claudication, left leg: Secondary | ICD-10-CM | POA: Diagnosis present

## 2015-02-20 DIAGNOSIS — I5023 Acute on chronic systolic (congestive) heart failure: Secondary | ICD-10-CM | POA: Diagnosis present

## 2015-02-20 DIAGNOSIS — I428 Other cardiomyopathies: Secondary | ICD-10-CM

## 2015-02-20 DIAGNOSIS — M7989 Other specified soft tissue disorders: Secondary | ICD-10-CM

## 2015-02-20 DIAGNOSIS — I2489 Other forms of acute ischemic heart disease: Secondary | ICD-10-CM | POA: Diagnosis present

## 2015-02-20 DIAGNOSIS — IMO0002 Reserved for concepts with insufficient information to code with codable children: Secondary | ICD-10-CM | POA: Diagnosis present

## 2015-02-20 DIAGNOSIS — I10 Essential (primary) hypertension: Secondary | ICD-10-CM | POA: Diagnosis present

## 2015-02-20 DIAGNOSIS — I2510 Atherosclerotic heart disease of native coronary artery without angina pectoris: Secondary | ICD-10-CM | POA: Diagnosis present

## 2015-02-20 LAB — CBC
HCT: 37.3 % — ABNORMAL LOW (ref 39.0–52.0)
Hemoglobin: 12.4 g/dL — ABNORMAL LOW (ref 13.0–17.0)
MCH: 27.4 pg (ref 26.0–34.0)
MCHC: 33.2 g/dL (ref 30.0–36.0)
MCV: 82.5 fL (ref 78.0–100.0)
Platelets: 351 10*3/uL (ref 150–400)
RBC: 4.52 MIL/uL (ref 4.22–5.81)
RDW: 12.9 % (ref 11.5–15.5)
WBC: 7.6 10*3/uL (ref 4.0–10.5)

## 2015-02-20 LAB — I-STAT TROPONIN, ED
Troponin i, poc: 0 ng/mL (ref 0.00–0.08)
Troponin i, poc: 0.04 ng/mL (ref 0.00–0.08)

## 2015-02-20 LAB — APTT: APTT: 27 s (ref 24–37)

## 2015-02-20 LAB — BASIC METABOLIC PANEL
ANION GAP: 9 (ref 5–15)
BUN: 8 mg/dL (ref 6–20)
CO2: 25 mmol/L (ref 22–32)
CREATININE: 1.01 mg/dL (ref 0.61–1.24)
Calcium: 9.4 mg/dL (ref 8.9–10.3)
Chloride: 103 mmol/L (ref 101–111)
GFR calc non Af Amer: >60 mL/min (ref >60)
Glucose, Bld: 198 mg/dL — ABNORMAL HIGH (ref 65–99)
POTASSIUM: 5 mmol/L (ref 3.5–5.1)
Sodium: 137 mmol/L (ref 135–145)

## 2015-02-20 LAB — PROTIME-INR
INR: 0.96 (ref <1.50)
PROTHROMBIN TIME: 12.8 s (ref 11.6–15.2)

## 2015-02-20 MED ORDER — FENTANYL CITRATE (PF) 100 MCG/2ML IJ SOLN
50.0000 ug | Freq: Once | INTRAMUSCULAR | Status: AC
  Administered 2015-02-20: 50 ug via INTRAVENOUS
  Filled 2015-02-20: qty 2

## 2015-02-20 MED ORDER — NITROGLYCERIN 2 % TD OINT
1.0000 [in_us] | TOPICAL_OINTMENT | Freq: Once | TRANSDERMAL | Status: AC
  Administered 2015-02-20: 1 [in_us] via TOPICAL
  Filled 2015-02-20: qty 1

## 2015-02-20 MED ORDER — NITROGLYCERIN 0.4 MG SL SUBL
0.4000 mg | SUBLINGUAL_TABLET | SUBLINGUAL | Status: DC | PRN
  Administered 2015-02-20 (×2): 0.4 mg via SUBLINGUAL
  Filled 2015-02-20: qty 1
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
## 2015-02-20 NOTE — ED Provider Notes (Signed)
Complains of of anterior chest pain onset 4:30 PM today. Feels like pressure. Nonradiating. Rezulin pain is 6 on scale 1-10. Treated by EMS with one sublingual nitroglycerin and aspirin, with partial relief. ED ECG REPORT   Date: Jul 10, 202016  Rate: 90  Rhythm: normal sinus rhythm  QRS Axis: normal  Intervals: normal  ST/T Wave abnormalities: nonspecific T wave changes  Conduction Disutrbances:none  Narrative Interpretation:   Old EKG Reviewed: Unchanged from 10/19/2013  I have personally reviewed the EKG tracing and agree with the computerized printout as noted.  11:20 PM After having received 2 additional sublingual nitroglycerin tablets here and intravenous fentanyl patient reports pain is 1 on a scale of 1-10. Nitroglycerin paste ordered  Orlie Dakin, MD 02/21/15 [id number]

## 2015-02-20 NOTE — ED Notes (Signed)
Discussed plan of care with Dr. Lenna Sciara. Planning for admission.

## 2015-02-20 NOTE — ED Notes (Addendum)
Per pharmacy tech - Janace Hoard is the pts home nurse - she is with him about 2 hours every day. The pt gets pills delivered from [facility] and throws most of them away, but she isn't sure which ones he throws away. He throws them away "because he doesn't need them". Kennieth Rad number is [phone], you can call her for more information.

## 2015-02-20 NOTE — ED Notes (Signed)
Portable x-ray at the bedside.  

## 2015-02-20 NOTE — ED Notes (Signed)
Per EMS, patient experienced chest pain around 4:30 today that would not go away. He also complains of left leg pain. Patient reports ingrown toenail on left foot, great toe, managed by podiatrist approximately 4 weeks ago, trimmed it, and has been bothering him ever since. Hx of DM, and HTN.  He reports beng scheduled for cath tomorrow. 1 nitro and 4 baby aspirin given by EMS. bp 138/80, p 64.

## 2015-02-21 ENCOUNTER — Observation Stay (HOSPITAL_COMMUNITY): Payer: 59

## 2015-02-21 ENCOUNTER — Other Ambulatory Visit: Payer: Self-pay

## 2015-02-21 ENCOUNTER — Encounter (HOSPITAL_COMMUNITY): Payer: Self-pay | Admitting: Cardiovascular Disease

## 2015-02-21 ENCOUNTER — Encounter (HOSPITAL_COMMUNITY)
Admission: EM | Disposition: A | Discharge status: Home / Self Care | Payer: 59 | Source: Home / Self Care | Attending: Internal Medicine

## 2015-02-21 ENCOUNTER — Ambulatory Visit (HOSPITAL_COMMUNITY): Admission: RE | Admit: 2015-02-21 | Payer: 59 | Source: Ambulatory Visit | Admitting: Cardiovascular Disease

## 2015-02-21 DIAGNOSIS — L97529 Non-pressure chronic ulcer of other part of left foot with unspecified severity: Secondary | ICD-10-CM

## 2015-02-21 DIAGNOSIS — R072 Precordial pain: Secondary | ICD-10-CM

## 2015-02-21 DIAGNOSIS — I998 Other disorder of circulatory system: Secondary | ICD-10-CM

## 2015-02-21 DIAGNOSIS — I10 Essential (primary) hypertension: Secondary | ICD-10-CM | POA: Diagnosis not present

## 2015-02-21 DIAGNOSIS — E785 Hyperlipidemia, unspecified: Secondary | ICD-10-CM | POA: Diagnosis not present

## 2015-02-21 DIAGNOSIS — R079 Chest pain, unspecified: Secondary | ICD-10-CM | POA: Diagnosis not present

## 2015-02-21 DIAGNOSIS — I2510 Atherosclerotic heart disease of native coronary artery without angina pectoris: Secondary | ICD-10-CM

## 2015-02-21 DIAGNOSIS — E11621 Type 2 diabetes mellitus with foot ulcer: Secondary | ICD-10-CM

## 2015-02-21 DIAGNOSIS — M79672 Pain in left foot: Secondary | ICD-10-CM

## 2015-02-21 DIAGNOSIS — L97509 Non-pressure chronic ulcer of other part of unspecified foot with unspecified severity: Secondary | ICD-10-CM

## 2015-02-21 HISTORY — PX: CARDIAC CATHETERIZATION: SHX172

## 2015-02-21 HISTORY — PX: PERIPHERAL VASCULAR CATHETERIZATION: SHX172C

## 2015-02-21 LAB — CK TOTAL AND CKMB (NOT AT ARMC)
CK TOTAL: 177 U/L (ref 49–397)
CK, MB: 7 ng/mL — ABNORMAL HIGH (ref 0.5–5.0)
CK, MB: 6 ng/mL — AB (ref 0.5–5.0)
Relative Index: 4 — ABNORMAL HIGH (ref 0.0–2.5)
Relative Index: 4.1 — ABNORMAL HIGH (ref 0.0–2.5)
Total CK: 145 U/L (ref 49–397)

## 2015-02-21 LAB — BASIC METABOLIC PANEL
ANION GAP: 9 (ref 5–15)
BUN: 8 mg/dL (ref 6–20)
CALCIUM: 9.3 mg/dL (ref 8.9–10.3)
CO2: 24 mmol/L (ref 22–32)
CREATININE: 0.93 mg/dL (ref 0.61–1.24)
Chloride: 108 mmol/L (ref 101–111)
GFR calc Af Amer: >60 mL/min (ref >60)
Glucose, Bld: 117 mg/dL — ABNORMAL HIGH (ref 65–99)
Potassium: 4 mmol/L (ref 3.5–5.1)
SODIUM: 141 mmol/L (ref 135–145)

## 2015-02-21 LAB — LIPID PANEL
CHOL/HDL RATIO: 7.2 ratio
Cholesterol: 195 mg/dL (ref 0–200)
HDL: 27 mg/dL — ABNORMAL LOW (ref >40)
LDL Cholesterol: 114 mg/dL — ABNORMAL HIGH (ref 0–99)
Triglycerides: 268 mg/dL — ABNORMAL HIGH (ref <150)
VLDL: 54 mg/dL — AB (ref 0–40)

## 2015-02-21 LAB — GLUCOSE, CAPILLARY
GLUCOSE-CAPILLARY: 132 mg/dL — AB (ref 65–99)
GLUCOSE-CAPILLARY: 158 mg/dL — AB (ref 65–99)
Glucose-Capillary: 110 mg/dL — ABNORMAL HIGH (ref 65–99)
Glucose-Capillary: 125 mg/dL — ABNORMAL HIGH (ref 65–99)
Glucose-Capillary: 154 mg/dL — ABNORMAL HIGH (ref 65–99)
Glucose-Capillary: 185 mg/dL — ABNORMAL HIGH (ref 65–99)

## 2015-02-21 LAB — C-REACTIVE PROTEIN

## 2015-02-21 LAB — TROPONIN I
TROPONIN I: 0.5 ng/mL — AB (ref <0.031)
Troponin I: 0.37 ng/mL — ABNORMAL HIGH (ref <0.031)

## 2015-02-21 LAB — PREALBUMIN: Prealbumin: 30 mg/dL (ref 18–38)

## 2015-02-21 LAB — BRAIN NATRIURETIC PEPTIDE: B NATRIURETIC PEPTIDE 5: 95.7 pg/mL (ref 0.0–100.0)

## 2015-02-21 LAB — SEDIMENTATION RATE: SED RATE: 6 mm/h (ref 0–16)

## 2015-02-21 SURGERY — LOWER EXTREMITY ANGIOGRAPHY

## 2015-02-21 MED ORDER — CARVEDILOL 3.125 MG PO TABS
3.1250 mg | ORAL_TABLET | Freq: Two times a day (BID) | ORAL | Status: DC
  Administered 2015-02-21 – 2015-02-23 (×5): 3.125 mg via ORAL
  Filled 2015-02-21 (×5): qty 1

## 2015-02-21 MED ORDER — FENOFIBRATE 54 MG PO TABS
54.0000 mg | ORAL_TABLET | Freq: Every day | ORAL | Status: DC
  Filled 2015-02-21: qty 1

## 2015-02-21 MED ORDER — OXYCODONE-ACETAMINOPHEN 10-325 MG PO TABS: 1.0000 | ORAL_TABLET | Freq: Four times a day (QID) | ORAL | Status: DC | PRN

## 2015-02-21 MED ORDER — EZETIMIBE 10 MG PO TABS
10.0000 mg | ORAL_TABLET | Freq: Every day | ORAL | Status: DC
  Administered 2015-02-22 – 2015-02-25 (×4): 10 mg via ORAL
  Filled 2015-02-21 (×4): qty 1

## 2015-02-21 MED ORDER — SODIUM CHLORIDE 0.9 % IJ SOLN: 3.0000 mL | INTRAMUSCULAR | Status: DC | PRN

## 2015-02-21 MED ORDER — FENTANYL CITRATE (PF) 100 MCG/2ML IJ SOLN
INTRAMUSCULAR | Status: DC | PRN
  Administered 2015-02-21: 50 ug via INTRAVENOUS

## 2015-02-21 MED ORDER — ACITRETIN 25 MG PO CAPS: 25.0000 mg | ORAL_CAPSULE | ORAL | Status: DC

## 2015-02-21 MED ORDER — MIDAZOLAM HCL 2 MG/2ML IJ SOLN
INTRAMUSCULAR | Status: DC | PRN
  Administered 2015-02-21: 1 mg via INTRAVENOUS

## 2015-02-21 MED ORDER — BUPROPION HCL ER (XL) 150 MG PO TB24
150.0000 mg | ORAL_TABLET | Freq: Every day | ORAL | Status: DC
  Administered 2015-02-22 – 2015-02-25 (×4): 150 mg via ORAL
  Filled 2015-02-21 (×4): qty 1

## 2015-02-21 MED ORDER — ENOXAPARIN SODIUM 40 MG/0.4ML ~~LOC~~ SOLN
40.0000 mg | Freq: Every day | SUBCUTANEOUS | Status: DC
Start: 2015-02-21 — End: 2015-02-25
  Administered 2015-02-23 – 2015-02-25 (×3): 40 mg via SUBCUTANEOUS
  Filled 2015-02-21 (×3): qty 0.4

## 2015-02-21 MED ORDER — ASPIRIN EC 325 MG PO TBEC: 325.0000 mg | DELAYED_RELEASE_TABLET | Freq: Every day | ORAL | Status: DC

## 2015-02-21 MED ORDER — PRO-STAT SUGAR FREE PO LIQD
30.0000 mL | Freq: Two times a day (BID) | ORAL | Status: DC
  Administered 2015-02-21 – 2015-02-25 (×7): 30 mL via ORAL
  Filled 2015-02-21 (×14): qty 30

## 2015-02-21 MED ORDER — ACETAMINOPHEN 325 MG PO TABS: 650.0000 mg | ORAL_TABLET | ORAL | Status: DC | PRN

## 2015-02-21 MED ORDER — LOSARTAN POTASSIUM 50 MG PO TABS
50.0000 mg | ORAL_TABLET | Freq: Every day | ORAL | Status: DC
  Administered 2015-02-22 – 2015-02-25 (×4): 50 mg via ORAL
  Filled 2015-02-21 (×4): qty 1

## 2015-02-21 MED ORDER — NITROGLYCERIN 2 % TD OINT
0.5000 [in_us] | TOPICAL_OINTMENT | Freq: Four times a day (QID) | TRANSDERMAL | Status: DC
  Administered 2015-02-21 – 2015-02-24 (×10): 0.5 [in_us] via TOPICAL
  Filled 2015-02-21 (×2): qty 30

## 2015-02-21 MED ORDER — LIDOCAINE HCL (PF) 1 % IJ SOLN
INTRAMUSCULAR | Status: AC
  Filled 2015-02-21: qty 30

## 2015-02-21 MED ORDER — SODIUM CHLORIDE 0.9 % WEIGHT BASED INFUSION: 3.0000 mL/kg/h | INTRAVENOUS | Status: DC

## 2015-02-21 MED ORDER — SERTRALINE HCL 100 MG PO TABS
100.0000 mg | ORAL_TABLET | Freq: Every day | ORAL | Status: DC
  Administered 2015-02-22 – 2015-02-25 (×4): 100 mg via ORAL
  Filled 2015-02-21 (×4): qty 1

## 2015-02-21 MED ORDER — FENTANYL CITRATE (PF) 100 MCG/2ML IJ SOLN
INTRAMUSCULAR | Status: AC
  Filled 2015-02-21: qty 2

## 2015-02-21 MED ORDER — VANCOMYCIN HCL 10 G IV SOLR
1500.0000 mg | INTRAVENOUS | Status: AC
  Administered 2015-02-21: 1500 mg via INTRAVENOUS
  Filled 2015-02-21: qty 1500

## 2015-02-21 MED ORDER — ASPIRIN 81 MG PO CHEW: 81.0000 mg | CHEWABLE_TABLET | Freq: Every day | ORAL | Status: DC

## 2015-02-21 MED ORDER — ASPIRIN 81 MG PO CHEW
81.0000 mg | CHEWABLE_TABLET | ORAL | Status: AC
  Administered 2015-02-21: 81 mg via ORAL
  Filled 2015-02-21: qty 1

## 2015-02-21 MED ORDER — HEPARIN (PORCINE) IN NACL 2-0.9 UNIT/ML-% IJ SOLN
INTRAMUSCULAR | Status: AC
  Filled 2015-02-21: qty 1000

## 2015-02-21 MED ORDER — OXYCODONE-ACETAMINOPHEN 5-325 MG PO TABS
1.0000 | ORAL_TABLET | Freq: Four times a day (QID) | ORAL | Status: DC | PRN
  Administered 2015-02-21 – 2015-02-25 (×12): 1 via ORAL
  Filled 2015-02-21 (×15): qty 1

## 2015-02-21 MED ORDER — TRAMADOL HCL 50 MG PO TABS
50.0000 mg | ORAL_TABLET | Freq: Four times a day (QID) | ORAL | Status: DC | PRN
  Administered 2015-02-21 – 2015-02-23 (×2): 50 mg via ORAL
  Filled 2015-02-21 (×2): qty 1

## 2015-02-21 MED ORDER — ASPIRIN 81 MG PO CHEW: 81.0000 mg | CHEWABLE_TABLET | ORAL | Status: DC

## 2015-02-21 MED ORDER — HYDROMORPHONE HCL 1 MG/ML IJ SOLN
0.5000 mg | INTRAMUSCULAR | Status: DC | PRN
  Administered 2015-02-21 – 2015-02-25 (×12): 0.5 mg via INTRAVENOUS
  Filled 2015-02-21 (×12): qty 1

## 2015-02-21 MED ORDER — SODIUM CHLORIDE 0.9 % WEIGHT BASED INFUSION: 1.0000 mL/kg/h | INTRAVENOUS | Status: DC

## 2015-02-21 MED ORDER — IODIXANOL 320 MG/ML IV SOLN
INTRAVENOUS | Status: DC | PRN
  Administered 2015-02-21: 150 mL via INTRA_ARTERIAL

## 2015-02-21 MED ORDER — HYDROXYZINE HCL 10 MG PO TABS
10.0000 mg | ORAL_TABLET | Freq: Three times a day (TID) | ORAL | Status: DC | PRN
  Administered 2015-02-23: 10 mg via ORAL
  Filled 2015-02-21 (×3): qty 1

## 2015-02-21 MED ORDER — OXYCODONE HCL 5 MG PO TABS
5.0000 mg | ORAL_TABLET | Freq: Four times a day (QID) | ORAL | Status: DC | PRN
  Administered 2015-02-21 – 2015-02-25 (×13): 5 mg via ORAL
  Filled 2015-02-21 (×15): qty 1

## 2015-02-21 MED ORDER — TAMSULOSIN HCL 0.4 MG PO CAPS
0.4000 mg | ORAL_CAPSULE | Freq: Every day | ORAL | Status: DC
  Administered 2015-02-22 – 2015-02-25 (×4): 0.4 mg via ORAL
  Filled 2015-02-21 (×4): qty 1

## 2015-02-21 MED ORDER — ATORVASTATIN CALCIUM 40 MG PO TABS
40.0000 mg | ORAL_TABLET | Freq: Two times a day (BID) | ORAL | Status: DC
  Administered 2015-02-21: 40 mg via ORAL
  Filled 2015-02-21: qty 1

## 2015-02-21 MED ORDER — CEFAZOLIN SODIUM 1-5 GM-% IV SOLN
1.0000 g | INTRAVENOUS | Status: DC
  Filled 2015-02-21: qty 50

## 2015-02-21 MED ORDER — ONDANSETRON HCL 4 MG/2ML IJ SOLN: 4.0000 mg | Freq: Four times a day (QID) | INTRAMUSCULAR | Status: DC | PRN

## 2015-02-21 MED ORDER — VANCOMYCIN HCL IN DEXTROSE 1-5 GM/200ML-% IV SOLN
1000.0000 mg | Freq: Two times a day (BID) | INTRAVENOUS | Status: DC
  Administered 2015-02-21 – 2015-02-23 (×4): 1000 mg via INTRAVENOUS
  Filled 2015-02-21 (×5): qty 200

## 2015-02-21 MED ORDER — DEXTROSE 5 % IV SOLN
2.0000 g | Freq: Three times a day (TID) | INTRAVENOUS | Status: DC
  Administered 2015-02-21 – 2015-02-23 (×6): 2 g via INTRAVENOUS
  Filled 2015-02-21 (×9): qty 2

## 2015-02-21 MED ORDER — DEXTROSE-NACL 5-0.45 % IV SOLN
INTRAVENOUS | Status: DC
  Administered 2015-02-21: 05:00:00 via INTRAVENOUS

## 2015-02-21 MED ORDER — ASPIRIN EC 81 MG PO TBEC: 81.0000 mg | DELAYED_RELEASE_TABLET | Freq: Every day | ORAL | Status: DC

## 2015-02-21 MED ORDER — MIDAZOLAM HCL 2 MG/2ML IJ SOLN
INTRAMUSCULAR | Status: AC
  Filled 2015-02-21: qty 2

## 2015-02-21 MED ORDER — ATORVASTATIN CALCIUM 80 MG PO TABS
80.0000 mg | ORAL_TABLET | Freq: Every day | ORAL | Status: DC
  Administered 2015-02-22 – 2015-02-25 (×4): 80 mg via ORAL
  Filled 2015-02-21: qty 2
  Filled 2015-02-21 (×3): qty 1

## 2015-02-21 MED ORDER — SODIUM CHLORIDE 0.9 % WEIGHT BASED INFUSION
1.0000 mL/kg/h | INTRAVENOUS | Status: DC
  Administered 2015-02-21: 1 mL/kg/h via INTRAVENOUS

## 2015-02-21 MED ORDER — GABAPENTIN 300 MG PO CAPS
300.0000 mg | ORAL_CAPSULE | Freq: Two times a day (BID) | ORAL | Status: DC
  Administered 2015-02-21 – 2015-02-22 (×4): 300 mg via ORAL
  Filled 2015-02-21 (×4): qty 1

## 2015-02-21 MED ORDER — SODIUM CHLORIDE 0.9 % IV SOLN: INTRAVENOUS | Status: AC

## 2015-02-21 MED ORDER — INSULIN ASPART 100 UNIT/ML ~~LOC~~ SOLN
0.0000 [IU] | SUBCUTANEOUS | Status: DC
  Administered 2015-02-21 – 2015-02-22 (×2): 2 [IU] via SUBCUTANEOUS
  Administered 2015-02-23: 3 [IU] via SUBCUTANEOUS
  Administered 2015-02-23: 2 [IU] via SUBCUTANEOUS

## 2015-02-21 SURGICAL SUPPLY — 14 items
CATH CROSS OVER TEMPO 5F (CATHETERS) ×1 IMPLANT
CATH INFINITI 5FR MULTPACK ANG (CATHETERS) ×1 IMPLANT
CATH SOFT-VU 4F 65 STRAIGHT (CATHETERS) IMPLANT
CATH SOFT-VU STRAIGHT 4F 65CM (CATHETERS) ×2
GUIDEWIRE ANGLED .035X150CM (WIRE) ×1 IMPLANT
KIT PV (KITS) ×2 IMPLANT
SET AVANTA SINGLE PATIENT (MISCELLANEOUS) ×1 IMPLANT
SHEATH AVANTA HAND CONTROLLER (MISCELLANEOUS) ×1 IMPLANT
SHEATH PINNACLE 5F 10CM (SHEATH) IMPLANT
SYR MEDRAD MARK V 150ML (SYRINGE) ×2 IMPLANT
TRANSDUCER W/STOPCOCK (MISCELLANEOUS) ×2 IMPLANT
TRAY PV CATH (CUSTOM PROCEDURE TRAY) ×2 IMPLANT
WIRE HITORQ VERSACORE ST 145CM (WIRE) ×1 IMPLANT
WIRE VERSACORE LOC 115CM (WIRE) ×1 IMPLANT

## 2015-02-21 NOTE — Progress Notes (Signed)
Initial Nutrition Assessment  DOCUMENTATION CODES:  Obesity unspecified  INTERVENTION:  Prostat BID  NUTRITION DIAGNOSIS:  Increased nutrient needs related to wound healing as evidenced by per patient/family report, estimated needs.  GOAL:  Patient will meet greater than or equal to 90% of their needs  MONITOR:  Labs, Supplement acceptance, Weight trends, I & O's, Skin  REASON FOR ASSESSMENT:  Consult Wound healing  ASSESSMENT: Pt is a 64 y.o. gentleman with a history of PAD (recently diagnosed with left common iliac stenosis as well as occluded posterior and anterior tibial arteries in the left leg), Type 2 DM, HTN, HLD, and nonobstructing CAD (last heart cath January 2015.   Pt trying to keep awake at time of visit, just received pain medication s/p cath earlier today. Pt's wife at bedside, helping with answering questions. Pt states he has been eating well prior to hospitalization and his weight has been steady (available wt hx confirms it).  Discussed with pt and wife the importance of protein for wound healing. Encouraged pt to order protein rich foods with every meal, provided examples based on the menu. Pt verbalized understanding. Pt is agreeable to Prostat BID to help with protein needs. Pt feels like he can meet his nutritional needs with food alone, refuses any other supplements. Stressed importance of eating several smaller meals and snacks per day since pt is also diabetic and ordering additional items on the tray to snack on later. Pt and wife grateful for the visit. Will continue to monitor.  Labs reviewed: Glu 110 - 198  Height:  Ht Readings from Last 1 Encounters:  02/21/15 5\' 9"  (1.753 m)    Weight:  Wt Readings from Last 1 Encounters:  02/21/15 223 lb 11.2 oz (101.47 kg)    Ideal Body Weight:  72.7 kg  Wt Readings from Last 10 Encounters:  02/21/15 223 lb 11.2 oz (101.47 kg)  02/19/15 225 lb 3.2 oz (102.15 kg)  02/08/15 228 lb (103.42 kg)   02/02/15 228 lb (103.42 kg)  01/29/15 225 lb (102.059 kg)  01/22/15 229 lb (103.874 kg)  12/19/14 225 lb (102.059 kg)  12/04/14 223 lb 14.4 oz (101.56 kg)  11/19/14 230 lb (104.327 kg)  11/05/14 230 lb 9.6 oz (104.599 kg)    BMI:  Body mass index is 33.02 kg/(m^2).  Estimated Nutritional Needs:  Kcal:  2100 - 2300  Protein:  105 - 115 g  Fluid:  2.0 L  Skin:  Wound (see comment) (diabetic foot ulcer)  Diet Order:  Diet heart healthy/carb modified Room service appropriate?: Yes; Fluid consistency:: Thin  EDUCATION NEEDS:  Education needs addressed   Intake/Output Summary (Last 24 hours) at 02/21/15 1527 Last data filed at 02/21/15 1300  Gross per 24 hour  Intake    240 ml  Output      0 ml  Net    240 ml    Last BM:  5/18  Aaron Boeh A. [place] Dietetic Intern Pager: [phone] 02/21/2015 3:38 PM
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
## 2015-02-21 NOTE — Progress Notes (Signed)
ANTIBIOTIC CONSULT NOTE - INITIAL  Pharmacy Consult for Vancomycin and Cefepime Indication:  Wound Infection  Allergies  Allergen Reactions  . Morphine Shortness Of Breath and Anaphylaxis  . Morphine And Related     "took my breath away"    Patient Measurements: Height: _0  (175.3 cm) Weight: 223 lb 11.2 oz (101.47 kg) IBW/kg (Calculated) : 70.7   Vital Signs: Temp: 97.8 F (36.6 C) (05/19 0115) Temp Source: Oral (05/19 0115) BP: 137/74 mmHg (05/19 0115) Pulse Rate: 77 (05/19 0115) Intake/Output from previous day:   Intake/Output from this shift:    Labs:  Recent Labs  02/19/15 1535 02/20/15 1950  WBC 6.5 7.6  HGB 12.9* 12.4*  PLT 369 351  CREATININE 0.99 1.01   Estimated Creatinine Clearance: 86.7 mL/min (by C-G formula based on Cr of 1.01). No results for input(s): VANCOTROUGH, VANCOPEAK, VANCORANDOM, GENTTROUGH, GENTPEAK, GENTRANDOM, TOBRATROUGH, TOBRAPEAK, TOBRARND, AMIKACINPEAK, AMIKACINTROU, AMIKACIN in the last 72 hours.   Microbiology: No results found for this or any previous visit (from the past 720 hour(s)).  Medical History: Past Medical History  Diagnosis Date  . Depression   . Diabetes mellitus without complication   . Hyperlipidemia   . Hypertension   . 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   . Critical lower limb ischemia     Medications:  Prescriptions prior to admission  Medication Sig Dispense Refill Last Dose  . acitretin (SORIATANE) 25 MG capsule Take one by mouth on Monday, Wednesday and Friday.   02/18/2015  . aspirin 81 MG tablet Take 81 mg by mouth daily.   02/17/2015  . atorvastatin (LIPITOR) 40 MG tablet Take 40 mg by mouth 2 (two) times daily.   02/17/2015  . buPROPion (WELLBUTRIN XL) 150 MG 24 hr tablet TAKE 1 TABLET BY MOUTH DAILY 30 tablet 5 02/17/2015  . carvedilol (COREG) 3.125 MG tablet Take 1 tablet (3.125 mg total) by mouth 2 (two) times daily with a meal. 60 tablet 11 02/17/2015  at 1800  . clobetasol cream (TEMOVATE) 5.02 % APPLY 1 APPLICATION TOPICALLY TWICE DAILY 30 g 2 04/16/2015 at Unknown time  . cyclobenzaprine (FLEXERIL) 10 MG tablet Take 1 tablet (10 mg total) by mouth 3 (three) times daily as needed for muscle spasms. 30 tablet 1 04/16/2015 at Unknown time  . doxycycline (VIBRAMYCIN) 100 MG capsule Take 1 capsule (100 mg total) by mouth 2 (two) times daily. 20 capsule 0 02/17/2015  . fenofibrate 54 MG tablet Take 54 mg by mouth daily.  11 02/17/2015  . gabapentin (NEURONTIN) 300 MG capsule Take 300 mg by mouth 2 (two) times daily.  1 02/17/2015  . glucose blood (ONE TOUCH TEST STRIPS) test strip CHECK 2 TIMES DAILY. ONE TOUCH ULTRA TEST STRIPS. DX:250.00 100 each 3 unknown  . glyBURIDE (DIABETA) 5 MG tablet TAKE 1 TABLET BY MOUTH TWICE DAILY WITH A MEAL 60 tablet 3 04/16/2015 at Unknown time  . hydrOXYzine (ATARAX/VISTARIL) 10 MG tablet Take 10 mg by mouth 3 (three) times daily as needed for itching.    04/16/2015 at Unknown time  . ibuprofen (ADVIL,MOTRIN) 800 MG tablet Take 800 mg by mouth every 8 (eight) hours as needed for mild pain or moderate pain.    02/17/2015  . Insulin Detemir (LEVEMIR FLEXTOUCH) 100 UNIT/ML Pen 30 units at bedtime 15 mL 11 02/19/2015 at Unknown time  . insulin lispro (HUMALOG) 100 UNIT/ML KiwkPen Inject 0.05 mLs (5 Units total) into the skin 3 (three)  times daily. 15 mL 5 10/12/2014 at Unknown time  . interferon alfa-2b (INTRON-A) 6000000 UNIT/ML injection Inject 0.25 cc (1.5 Mu) three times a week subcutaneously   02/18/2015  . losartan (COZAAR) 50 MG tablet Take 1 tablet (50 mg total) by mouth daily. 30 tablet 5 02/17/2015  . metFORMIN (GLUCOPHAGE) 500 MG tablet TAKE 2 TABLETS BY MOUTH TWICE DAILY WITH MEALS 120 tablet 2 10/12/2014 at Unknown time  . nitroGLYCERIN (NITROSTAT) 0.4 MG SL tablet Place 1 tablet (0.4 mg total) under the tongue every 5 (five) minutes as needed for chest pain. 60 tablet 12 10/12/2014 at Unknown time  . ONE TOUCH LANCETS  MISC Check 2 times daily. 100 each 3 unknown  . oxyCODONE-acetaminophen (PERCOCET) 10-325 MG per tablet Take 1 tablet by mouth every 8 (eight) hours as needed for pain. 20 tablet 0 10/12/2014 at Unknown time  . sertraline (ZOLOFT) 100 MG tablet TAKE 1 TABLET BY MOUTH EVERY DAY 30 tablet 5 02/17/2015  . tamsulosin (FLOMAX) 0.4 MG CAPS capsule TAKE 1 CAPSULE BY MOUTH DAILY 30 capsule 5 02/17/2015  . traMADol (ULTRAM) 50 MG tablet Take 1 tablet (50 mg total) by mouth every 6 (six) hours as needed (Avoid regular use.). 60 tablet 0 02/17/2015  . triamcinolone cream (KENALOG) 0.1 % Apply 1 application topically daily as needed (affected areas).    02/17/2015  . [DISCONTINUED] gabapentin (NEURONTIN) 100 MG capsule TAKE 1 CAPSULE BY MOUTH TWICE DAILY (Patient not taking: Reported on 10/12/2014) 60 capsule 5 Not Taking at Unknown time  . [DISCONTINUED] HYDROcodone-acetaminophen (NORCO) 10-325 MG per tablet Take 1 tablet by mouth every 8 (eight) hours as needed. (Patient not taking: Reported on 10/12/2014) 30 tablet 0 Not Taking at Unknown time  . [DISCONTINUED] meloxicam (MOBIC) 15 MG tablet Take 1 tablet (15 mg total) by mouth daily. (Patient not taking: Reported on 10/12/2014) 30 tablet 3 Not Taking at Unknown time  . [DISCONTINUED] mupirocin cream (BACTROBAN) 2 % Apply 1 application topically 3 (three) times daily. On toe (Patient not taking: Reported on 10/12/2014) 15 g 0 Completed Course at Unknown time   Scheduled:  . [START ON 02/22/2015] acitretin  25 mg Oral Q M,W,F  . aspirin EC  81 mg Oral Daily  . atorvastatin  40 mg Oral BID  . buPROPion  150 mg Oral Daily  . carvedilol  3.125 mg Oral BID WC  . enoxaparin (LOVENOX) injection  40 mg Subcutaneous Daily  . fenofibrate  54 mg Oral Daily  . gabapentin  300 mg Oral BID  . insulin aspart  0-9 Units Subcutaneous 6 times per day  . losartan  50 mg Oral Daily  . nitroGLYCERIN  0.5 inch Topical 4 times per day  . sertraline  100 mg Oral Daily  . tamsulosin   0.4 mg Oral Daily   Assessment: 64  Y.o male presented on 5/18 PM with Chief Complaint: chest pain, left leg and foot pain. H/o PAD, type 2 DM and as noted above. S/p procedure for for management of an ingrown toenail involving the left great toe, he has completed a 10 day course of Keflex and was most recently placed on doxycycline for persistent pain, swelling, and intermittent drainage. He says that he is still taking doxycycline. Pharmacy consulted to dose  Vancomycin and Cefepime for a wound Infection.  SCr 1.01, estimated CrCl ~ 86 ml/min.    Goal of Therapy:  Vancomycin trough level 10-15 mcg/ml  Plan:  Cefepime 2 gm IV q8h Vancomycin 1.5g IV  x1 then 1000 mg IV q12h Monitor daily clinical status, renal function, culture results if ordered.   Thank you for allowing pharmacy to be part of this patients care team.  Nicole Cella, RPh Clinical Pharmacist Pager: [phone] 02/21/2015,4:43 AM

## 2015-02-21 NOTE — Progress Notes (Signed)
  Echocardiogram 2D Echocardiogram has been performed.  Eric Price 02/21/2015, 2:57 PM

## 2015-02-21 NOTE — Progress Notes (Signed)
[facility]  This is a 64 year old male with past medical history of PAD (recently diagnosed with left common iliac stenosis as well as occluded posterior and anterior tibial arteries in the left leg), Type 2 DM, HTN, HLD, and nonobstructing CAD who presents with a complaint of chest pain.The patient also has an ulcer of the left first toe which occurred after an ingrown toenail was removed. He has been on antibiotics as outpatient for this-  he was referred to Dr. Gwenlyn Found and underwent arterial Doppler studies on 5/10 which revealed mild stenosis of the left common iliac artery and occluded posterior and anterior tibial arteries. Dr. Gwenlyn Found had planned to do an angiogram as outpatient today.  The patient was evaluated by myself this morning. He had no complaints at that time including no chest pain shortness of breath nausea or other GI issues. He had some mild pain in his left foot for which she had received pain medication earlier.  Principal Problem:   Chest pain -Underwent cardiac cath this afternoon which revealed a distal RCA stenosis of 75% and a mid LAD stenosis of 40% -wall motion abnormalities with moderate global hypokinesia and moderate to severe inferior hypokinesia noted - EF was 25%-etiology is concerned that the patient likely formed a thrombus in the LV which embolized to his left leg resulting in ischemia of the leg-Echo ordered with Definity to further evaluate for residual LV clot  Active Problems:  Critical limb ischemia/   foot ulcer -Underwent angiogram of the left leg today-according to the report, the patient likely had an embolus secondary to poor LV function mentioned above-cardiology has requested a surgical eval for possible amputation-apparently surgery has told the patient that he will undergo surgery tomorrow- note from surgery is still pending - continue vancomycin and cefepime for now- he was taking doxycycline for it as an outpatient    Type 2 diabetes  mellitus - cont Insulin sliding scale -will not yet resume long-acting insulin as sugars are controlled without it and he is possibly going to have a procedure tomorrow    Hypertension - Continue carvedilol and losartan    Hyperlipidemia -Taking Lipitor and facet area  Debbe Odea, MD Pager # on [email or web address]
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
## 2015-02-21 NOTE — Progress Notes (Signed)
Site area: right  Groin a 5 french sheath was removed   Site Prior to Removal:  Level 0  Pressure Applied For 15 MINUTES    Minutes Beginning at 1150a  Manual:   Yes.    Patient Status During Pull:  stable  Post Pull Groin Site:  Level 0  Post Pull Instructions Given:  Yes.    Post Pull Pulses Present:  Yes.    Dressing Applied:  Yes.    Comments:  VS remain stable during sheath pull.  Pt denies any discomfort at this time.

## 2015-02-21 NOTE — Progress Notes (Signed)
Paged on call for Vascular Surgery, spoke with Dr. Bridgett Larsson. Hold off on consent until Vascular sees patient in am.
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
## 2015-02-21 NOTE — Consult Note (Signed)
Primary Physician: Primary Cardiologist:  Gwenlyn Found  Asked  To see re CP    HPI: Patient is a 64 yo who has a history of PAD, DM, HTN, HL and nonobstructive CAD by cath in Jan 2015 (LAD 30 to 40%; LCx without signif dz; RCA with 50% distal stensosi; LVEF 45%).  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  Had sharp chest pain.  SOB  Heavy in chest  Went to lay down  Took 10 min nap  Got up when phone rang  COntinued to feel bad.   Wife came home around 2:30  Still not feeling good.  Called 911.   When EMS came patient says his BP was up  Given NTG and over time symptoms improved and BP improved.  Currently denies nausea, no dizziness  NO CP  No SOB His leg is bothering him  He says it has been rough the past few wks  NOt sleeping due to discomfort.       Past Medical History  Diagnosis Date  . Depression   . Diabetes mellitus without complication   . Hyperlipidemia   . Hypertension   . 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   . Critical lower limb ischemia     Medications Prior to Admission  Medication Sig Dispense Refill  . acitretin (SORIATANE) 25 MG capsule Take one by mouth on Monday, Wednesday and Friday.    Marland Kitchen aspirin 81 MG tablet Take 81 mg by mouth daily.    Marland Kitchen atorvastatin (LIPITOR) 40 MG tablet Take 40 mg by mouth 2 (two) times daily.    Marland Kitchen buPROPion (WELLBUTRIN XL) 150 MG 24 hr tablet TAKE 1 TABLET BY MOUTH DAILY 30 tablet 5  . carvedilol (COREG) 3.125 MG tablet Take 1 tablet (3.125 mg total) by mouth 2 (two) times daily with a meal. 60 tablet 11  . clobetasol cream (TEMOVATE) 9.56 % APPLY 1 APPLICATION TOPICALLY TWICE DAILY 30 g 2  . cyclobenzaprine (FLEXERIL) 10 MG tablet Take 1 tablet (10 mg total) by mouth 3 (three) times daily as needed for muscle spasms. 30 tablet 1  . doxycycline (VIBRAMYCIN)  100 MG capsule Take 1 capsule (100 mg total) by mouth 2 (two) times daily. 20 capsule 0  . fenofibrate 54 MG tablet Take 54 mg by mouth daily.  11  . gabapentin (NEURONTIN) 300 MG capsule Take 300 mg by mouth 2 (two) times daily.  1  . glucose blood (ONE TOUCH TEST STRIPS) test strip CHECK 2 TIMES DAILY. ONE TOUCH ULTRA TEST STRIPS. DX:250.00 100 each 3  . glyBURIDE (DIABETA) 5 MG tablet TAKE 1 TABLET BY MOUTH TWICE DAILY WITH A MEAL 60 tablet 3  . hydrOXYzine (ATARAX/VISTARIL) 10 MG tablet Take 10 mg by mouth 3 (three) times daily as needed for itching.     Marland Kitchen ibuprofen (ADVIL,MOTRIN) 800 MG tablet Take 800 mg by mouth every 8 (eight) hours as needed for mild pain or moderate pain.     . Insulin Detemir (LEVEMIR FLEXTOUCH) 100 UNIT/ML Pen 30 units at bedtime 15 mL 11  . insulin lispro (HUMALOG) 100 UNIT/ML KiwkPen Inject 0.05 mLs (5 Units total) into the skin 3 (three) times daily. 15 mL 5  . interferon alfa-2b (INTRON-A) 6000000 UNIT/ML injection Inject  0.25 cc (1.5 Mu) three times a week subcutaneously    . losartan (COZAAR) 50 MG tablet Take 1 tablet (50 mg total) by mouth daily. 30 tablet 5  . metFORMIN (GLUCOPHAGE) 500 MG tablet TAKE 2 TABLETS BY MOUTH TWICE DAILY WITH MEALS 120 tablet 2  . nitroGLYCERIN (NITROSTAT) 0.4 MG SL tablet Place 1 tablet (0.4 mg total) under the tongue every 5 (five) minutes as needed for chest pain. 60 tablet 12  . ONE TOUCH LANCETS MISC Check 2 times daily. 100 each 3  . oxyCODONE-acetaminophen (PERCOCET) 10-325 MG per tablet Take 1 tablet by mouth every 8 (eight) hours as needed for pain. 20 tablet 0  . sertraline (ZOLOFT) 100 MG tablet TAKE 1 TABLET BY MOUTH EVERY DAY 30 tablet 5  . tamsulosin (FLOMAX) 0.4 MG CAPS capsule TAKE 1 CAPSULE BY MOUTH DAILY 30 capsule 5  . traMADol (ULTRAM) 50 MG tablet Take 1 tablet (50 mg total) by mouth every 6 (six) hours as needed (Avoid regular use.). 60 tablet 0  . triamcinolone cream (KENALOG) 0.1 % Apply 1 application  topically daily as needed (affected areas).     . [DISCONTINUED] gabapentin (NEURONTIN) 100 MG capsule TAKE 1 CAPSULE BY MOUTH TWICE DAILY (Patient not taking: Reported on 02-13-202016) 60 capsule 5  . [DISCONTINUED] HYDROcodone-acetaminophen (NORCO) 10-325 MG per tablet Take 1 tablet by mouth every 8 (eight) hours as needed. (Patient not taking: Reported on 02-13-202016) 30 tablet 0  . [DISCONTINUED] meloxicam (MOBIC) 15 MG tablet Take 1 tablet (15 mg total) by mouth daily. (Patient not taking: Reported on 02-13-202016) 30 tablet 3  . [DISCONTINUED] mupirocin cream (BACTROBAN) 2 % Apply 1 application topically 3 (three) times daily. On toe (Patient not taking: Reported on 02-13-202016) 15 g 0     . [START ON 02/22/2015] acitretin  25 mg Oral Q M,W,F  . aspirin  81 mg Oral Pre-Cath  . [START ON 02/22/2015] aspirin  81 mg Oral Daily  . atorvastatin  40 mg Oral BID  . buPROPion  150 mg Oral Daily  . carvedilol  3.125 mg Oral BID WC  . ceFEPime (MAXIPIME) IV  2 g Intravenous 3 times per day  . enoxaparin (LOVENOX) injection  40 mg Subcutaneous Daily  . fenofibrate  54 mg Oral Daily  . gabapentin  300 mg Oral BID  . insulin aspart  0-9 Units Subcutaneous 6 times per day  . losartan  50 mg Oral Daily  . nitroGLYCERIN  0.5 inch Topical 4 times per day  . sertraline  100 mg Oral Daily  . tamsulosin  0.4 mg Oral Daily  . vancomycin  1,500 mg Intravenous NOW  . vancomycin  1,000 mg Intravenous Q12H    Infusions: . sodium chloride     Followed by  . sodium chloride      Allergies  Allergen Reactions  . Morphine Shortness Of Breath and Anaphylaxis  . Morphine And Related     "took my breath away"    History   Social History  . Marital Status: Married    Spouse Name: N/A  . Number of Children: 2  . Years of Education: N/A   Occupational History  .      upholster.    Social History Main Topics  . Smoking status: Never Smoker   . Smokeless tobacco: Never Used  . Alcohol Use: No  . Drug  Use: No  . Sexual Activity: Not on file   Other Topics Concern  . Not on  file   Social History Narrative   Lives with wife.     Family History  Problem Relation Age of Onset  . CAD Father 54    Died 3  . Hypertension Father   . Diabetes Maternal Grandmother   . CAD Paternal Grandfather 66  . Heart failure Mother 44  . Colon cancer Neg Hx   . Esophageal cancer Neg Hx   . Stomach cancer Neg Hx   . Rectal cancer Neg Hx     REVIEW OF SYSTEMS:  All systems reviewed  Negative to the above problem except as noted above.    PHYSICAL EXAM: Filed Vitals:   02/21/15 0728  BP: 120/79  Pulse:   Temp:   Resp:      Intake/Output Summary (Last 24 hours) at 02/21/15 0814 Last data filed at 02/21/15 0200  Gross per 24 hour  Intake      0 ml  Output      0 ml  Net      0 ml    General:  Well appearing. No respiratory difficulty HEENT: normal Neck: supple. no JVD. Carotids 2+ bilat; no bruits. No lymphadenopathy or thryomegaly appreciated. Cor: PMI nondisplaced. Regular rate & rhythm. No rubs, gallops or murmurs. Lungs: clear Abdomen: soft, nontender, nondistended. No hepatosplenomegaly. No bruits or masses. Good bowel sounds. Extremities: no cyanosis, clubbing, rash, edema Neuro: alert & oriented x 3, cranial nerves grossly intact. moves all 4 extremities w/o difficulty. Affect pleasant.  ECG:  SR  80  T wave inversion laterally  (new)  Results for orders placed or performed during the hospital encounter of 02/20/15 (from the past 24 hour(s))  CBC     Status: Abnormal   Collection Time: 02/20/15  7:50 PM  Result Value Ref Range   WBC 7.6 4.0 - 10.5 K/uL   RBC 4.52 4.22 - 5.81 MIL/uL   Hemoglobin 12.4 (L) 13.0 - 17.0 g/dL   HCT 37.3 (L) 39.0 - 52.0 %   MCV 82.5 78.0 - 100.0 fL   MCH 27.4 26.0 - 34.0 pg   MCHC 33.2 30.0 - 36.0 g/dL   RDW 12.9 11.5 - 15.5 %   Platelets 351 150 - 400 K/uL  Basic metabolic panel     Status: Abnormal   Collection Time: 02/20/15  7:50 PM    Result Value Ref Range   Sodium 137 135 - 145 mmol/L   Potassium 5.0 3.5 - 5.1 mmol/L   Chloride 103 101 - 111 mmol/L   CO2 25 22 - 32 mmol/L   Glucose, Bld 198 (H) 65 - 99 mg/dL   BUN 8 6 - 20 mg/dL   Creatinine, Ser 1.01 0.61 - 1.24 mg/dL   Calcium 9.4 8.9 - 10.3 mg/dL   GFR calc non Af Amer >60 >60 mL/min   GFR calc Af Amer >60 >60 mL/min   Anion gap 9 5 - 15  I-stat troponin, ED  (not at [facility], [facility])     Status: None   Collection Time: 02/20/15  7:55 PM  Result Value Ref Range   Troponin i, poc 0.00 0.00 - 0.08 ng/mL   Comment 3          I-stat troponin, ED     Status: None   Collection Time: 02/20/15 11:12 PM  Result Value Ref Range   Troponin i, poc 0.04 0.00 - 0.08 ng/mL   Comment 3          Glucose, capillary  Status: Abnormal   Collection Time: 02/21/15  4:36 AM  Result Value Ref Range   Glucose-Capillary 110 (H) 65 - 99 mg/dL  Sedimentation rate     Status: None   Collection Time: 02/21/15  4:43 AM  Result Value Ref Range   Sed Rate 6 0 - 16 mm/hr  C-reactive protein     Status: None   Collection Time: 02/21/15  4:43 AM  Result Value Ref Range   CRP <0.5 <1.0 mg/dL  Prealbumin     Status: None   Collection Time: 02/21/15  4:43 AM  Result Value Ref Range   Prealbumin 30.0 18 - 38 mg/dL   CK total and CKMB (cardiac)     Status: Abnormal   Collection Time: 02/21/15  4:43 AM  Result Value Ref Range   Total CK 177 49 - 397 U/L   CK, MB 7.0 (H) 0.5 - 5.0 ng/mL   Relative Index 4.0 (H) 0.0 - 2.5  Troponin I     Status: Abnormal   Collection Time: 02/21/15  4:43 AM  Result Value Ref Range   Troponin I 0.37 (H) <0.031 ng/mL  Brain natriuretic peptide     Status: None   Collection Time: 02/21/15  4:43 AM  Result Value Ref Range   B Natriuretic Peptide 95.7 0.0 - 100.0 pg/mL  Lipid panel     Status: Abnormal   Collection Time: 02/21/15  4:43 AM  Result Value Ref Range   Cholesterol 195 0 - 200 mg/dL   Triglycerides 268 (H) <150 mg/dL   HDL 27 (L) >40 mg/dL    Total CHOL/HDL Ratio 7.2 RATIO   VLDL 54 (H) 0 - 40 mg/dL   LDL Cholesterol 114 (H) 0 - 99 mg/dL  Glucose, capillary     Status: Abnormal   Collection Time: 02/21/15  7:50 AM  Result Value Ref Range   Glucose-Capillary 125 (H) 65 - 99 mg/dL   Comment 1 Notify RN    Comment 2 Document in Chart    Dg Chest Port 1 View  23-Feb-202016   CLINICAL DATA:  Shortness of breath and tachycardia beginning today, blood clots in LEFT leg for which patient was scheduled to have surgery tomorrow, at diabetes, hypertension, on chemotherapy for skin cancer  EXAM: PORTABLE CHEST - 1 VIEW  COMPARISON:  Portable exam 2035 hours compared to 10/19/2013  FINDINGS: Upper normal heart size likely accentuated by technique.  Tortuous aorta.  Pulmonary vascularity normal.  Lungs clear.  No pleural effusion or pneumothorax.  Scattered endplate spur formation thoracic spine.  IMPRESSION: No acute abnormalities.   Electronically Signed   By: Lavonia Dana M.D.   On: 023-Feb-202016 20:55   Ap / Lateral X-ray Left Foot  02/21/2015   CLINICAL DATA:  Pain and swelling left great toe. Diabetic patient with history of ingrown toenail removal.  EXAM: LEFT FOOT - 2 VIEW  COMPARISON:  None.  FINDINGS: No osseous destructive change to suggest osteomyelitis. No erosion or periosteal reaction. 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 Hallux valgus with medial soft tissue prominence at the first metatarsal phalangeal joint suggesting bunion. No fracture or dislocation. No radiopaque foreign body. Plantar calcaneal spur is noted.  IMPRESSION: 1. No acute osseous abnormality radiographic findings of osteomyelitis. 2. Hallux valgus with osteoarthritis of the first metatarsal phalangeal joint medial soft tissue prominence, may reflect bunion.   Electronically Signed   By: Jeb Levering M.D.   On: 02/21/2015 05:02  ASSESSMENT:  64 yo with history of mild CAD , signif PVOD   Presents with CP  HTNsive at time   Symptoms have improved with NTG.  Initial trop neg but latest is mildly increased   EKG with T wave inversion.  Currently symtpom free from chest pain standpoint Still with signif discomfort in leg.  Discussed with Adora Fridge  With trop bump will plan for cath to define coronary anatomy.  Also plan to look at legs  Patient being hydrated  WIll recheck labs  Since po intake down yesterday.    2.  PVOD  As above  3.  HTN  Need to follow   4.  HL  Lipid are not optimal  LDL 114  Trigs 268  On Lipitor 40 bid  WIll switch to 80 qhs    On tricor  WOuld d/c  Check Hgb A1 C. Add Zetia   Dorris Carnes

## 2015-02-21 NOTE — Interval H&P Note (Signed)
Cath Lab Visit (complete for each Cath Lab visit)  Clinical Evaluation Leading to the Procedure:   ACS: Yes.    Non-ACS:    Anginal Classification: CCS IV  Anti-ischemic medical therapy: No Therapy  Non-Invasive Test Results: No non-invasive testing performed  Prior CABG: No previous CABG      History and Physical Interval Note:  02/21/2015 10:25 AM  Eric Price  has presented today for surgery, with the diagnosis of pad  The various methods of treatment have been discussed with the patient and family. After consideration of risks, benefits and other options for treatment, the patient has consented to  Procedure(s): Lower Extremity Angiography (N/A) Left Heart Cath and Coronary Angiography (N/A) as a surgical intervention .  The patient's history has been reviewed, patient examined, no change in status, stable for surgery.  I have reviewed the patient's chart and labs.  Questions were answered to the patient's satisfaction.     Quay Burow

## 2015-02-22 ENCOUNTER — Observation Stay (HOSPITAL_COMMUNITY): Payer: 59 | Admitting: Anesthesiology

## 2015-02-22 ENCOUNTER — Other Ambulatory Visit: Payer: Self-pay

## 2015-02-22 ENCOUNTER — Encounter (HOSPITAL_COMMUNITY)
Admission: EM | Disposition: A | Discharge status: Home / Self Care | Payer: Self-pay | Source: Home / Self Care | Attending: Internal Medicine

## 2015-02-22 DIAGNOSIS — F329 Major depressive disorder, single episode, unspecified: Secondary | ICD-10-CM | POA: Diagnosis present

## 2015-02-22 DIAGNOSIS — I2510 Atherosclerotic heart disease of native coronary artery without angina pectoris: Secondary | ICD-10-CM | POA: Diagnosis present

## 2015-02-22 DIAGNOSIS — R0789 Other chest pain: Secondary | ICD-10-CM

## 2015-02-22 DIAGNOSIS — I82442 Acute embolism and thrombosis of left tibial vein: Secondary | ICD-10-CM | POA: Diagnosis not present

## 2015-02-22 DIAGNOSIS — L97529 Non-pressure chronic ulcer of other part of left foot with unspecified severity: Secondary | ICD-10-CM | POA: Diagnosis present

## 2015-02-22 DIAGNOSIS — I70212 Atherosclerosis of native arteries of extremities with intermittent claudication, left leg: Secondary | ICD-10-CM | POA: Diagnosis present

## 2015-02-22 DIAGNOSIS — E1165 Type 2 diabetes mellitus with hyperglycemia: Secondary | ICD-10-CM | POA: Diagnosis present

## 2015-02-22 DIAGNOSIS — I248 Other forms of acute ischemic heart disease: Secondary | ICD-10-CM | POA: Diagnosis present

## 2015-02-22 DIAGNOSIS — I5023 Acute on chronic systolic (congestive) heart failure: Secondary | ICD-10-CM | POA: Diagnosis present

## 2015-02-22 DIAGNOSIS — I429 Cardiomyopathy, unspecified: Secondary | ICD-10-CM | POA: Diagnosis present

## 2015-02-22 DIAGNOSIS — I998 Other disorder of circulatory system: Secondary | ICD-10-CM | POA: Diagnosis not present

## 2015-02-22 DIAGNOSIS — Z794 Long term (current) use of insulin: Secondary | ICD-10-CM | POA: Diagnosis not present

## 2015-02-22 DIAGNOSIS — I743 Embolism and thrombosis of arteries of the lower extremities: Secondary | ICD-10-CM | POA: Insufficient documentation

## 2015-02-22 DIAGNOSIS — I10 Essential (primary) hypertension: Secondary | ICD-10-CM | POA: Diagnosis present

## 2015-02-22 DIAGNOSIS — E785 Hyperlipidemia, unspecified: Secondary | ICD-10-CM | POA: Diagnosis present

## 2015-02-22 DIAGNOSIS — E11621 Type 2 diabetes mellitus with foot ulcer: Secondary | ICD-10-CM | POA: Diagnosis present

## 2015-02-22 DIAGNOSIS — I428 Other cardiomyopathies: Secondary | ICD-10-CM

## 2015-02-22 DIAGNOSIS — Z7982 Long term (current) use of aspirin: Secondary | ICD-10-CM | POA: Diagnosis not present

## 2015-02-22 DIAGNOSIS — C8409 Mycosis fungoides, extranodal and solid organ sites: Secondary | ICD-10-CM | POA: Diagnosis present

## 2015-02-22 DIAGNOSIS — R079 Chest pain, unspecified: Secondary | ICD-10-CM | POA: Diagnosis present

## 2015-02-22 HISTORY — PX: AMPUTATION: SHX166

## 2015-02-22 LAB — BASIC METABOLIC PANEL
Anion gap: 7 (ref 5–15)
BUN: 8 mg/dL (ref 6–20)
CALCIUM: 9 mg/dL (ref 8.9–10.3)
CHLORIDE: 109 mmol/L (ref 101–111)
CO2: 23 mmol/L (ref 22–32)
Creatinine, Ser: 0.99 mg/dL (ref 0.61–1.24)
GFR calc non Af Amer: >60 mL/min (ref >60)
Glucose, Bld: 170 mg/dL — ABNORMAL HIGH (ref 65–99)
Potassium: 4 mmol/L (ref 3.5–5.1)
Sodium: 139 mmol/L (ref 135–145)

## 2015-02-22 LAB — GLUCOSE, CAPILLARY
GLUCOSE-CAPILLARY: 159 mg/dL — AB (ref 65–99)
GLUCOSE-CAPILLARY: 156 mg/dL — AB (ref 65–99)
GLUCOSE-CAPILLARY: 212 mg/dL — AB (ref 65–99)
GLUCOSE-CAPILLARY: 189 mg/dL — AB (ref 65–99)
Glucose-Capillary: 146 mg/dL — ABNORMAL HIGH (ref 65–99)
Glucose-Capillary: 152 mg/dL — ABNORMAL HIGH (ref 65–99)
Glucose-Capillary: 162 mg/dL — ABNORMAL HIGH (ref 65–99)
Glucose-Capillary: 194 mg/dL — ABNORMAL HIGH (ref 65–99)

## 2015-02-22 LAB — CBC
HCT: 35.3 % — ABNORMAL LOW (ref 39.0–52.0)
HEMOGLOBIN: 11.7 g/dL — AB (ref 13.0–17.0)
MCH: 27.6 pg (ref 26.0–34.0)
MCHC: 33.1 g/dL (ref 30.0–36.0)
MCV: 83.3 fL (ref 78.0–100.0)
Platelets: 340 10*3/uL (ref 150–400)
RBC: 4.24 MIL/uL (ref 4.22–5.81)
RDW: 13.1 % (ref 11.5–15.5)
WBC: 6.9 10*3/uL (ref 4.0–10.5)

## 2015-02-22 LAB — HEMOGLOBIN A1C
Hgb A1c MFr Bld: 8.7 % — ABNORMAL HIGH (ref 4.8–5.6)
Mean Plasma Glucose: 203 mg/dL

## 2015-02-22 LAB — SURGICAL PCR SCREEN
MRSA, PCR: NEGATIVE
STAPHYLOCOCCUS AUREUS: NEGATIVE

## 2015-02-22 SURGERY — AMPUTATION, FOOT, RAY
Anesthesia: Monitor Anesthesia Care | Site: Foot | Laterality: Left

## 2015-02-22 MED ORDER — 0.9 % SODIUM CHLORIDE (POUR BTL) OPTIME
TOPICAL | Status: DC | PRN
  Administered 2015-02-22: 1000 mL

## 2015-02-22 MED ORDER — FENTANYL CITRATE (PF) 250 MCG/5ML IJ SOLN
INTRAMUSCULAR | Status: DC | PRN
  Administered 2015-02-22: 50 ug via INTRAVENOUS
  Administered 2015-02-22: 100 ug via INTRAVENOUS

## 2015-02-22 MED ORDER — MIDAZOLAM HCL 2 MG/2ML IJ SOLN
INTRAMUSCULAR | Status: DC | PRN
  Administered 2015-02-22: 2 mg via INTRAVENOUS

## 2015-02-22 MED ORDER — CEFAZOLIN SODIUM-DEXTROSE 2-3 GM-% IV SOLR
2.0000 g | INTRAVENOUS | Status: AC
  Administered 2015-02-22: 2 g via INTRAVENOUS
  Filled 2015-02-22 (×2): qty 50

## 2015-02-22 MED ORDER — HYDROMORPHONE HCL 1 MG/ML IJ SOLN: 0.2500 mg | INTRAMUSCULAR | Status: DC | PRN

## 2015-02-22 MED ORDER — LACTATED RINGERS IV SOLN
INTRAVENOUS | Status: DC
  Administered 2015-02-22 (×2): via INTRAVENOUS

## 2015-02-22 MED ORDER — MAGNESIUM HYDROXIDE 400 MG/5ML PO SUSP
30.0000 mL | Freq: Every day | ORAL | Status: DC | PRN
  Administered 2015-02-22 – 2015-02-23 (×2): 30 mL via ORAL
  Filled 2015-02-22 (×3): qty 30

## 2015-02-22 MED ORDER — PROPOFOL 10 MG/ML IV BOLUS
INTRAVENOUS | Status: AC
  Filled 2015-02-22: qty 20

## 2015-02-22 MED ORDER — OXYCODONE-ACETAMINOPHEN 5-325 MG PO TABS
1.0000 | ORAL_TABLET | Freq: Once | ORAL | Status: AC
  Administered 2015-02-22: 1 via ORAL
  Filled 2015-02-22: qty 1

## 2015-02-22 MED ORDER — OXYCODONE HCL 5 MG PO TABS
5.0000 mg | ORAL_TABLET | Freq: Once | ORAL | Status: AC
  Administered 2015-02-22: 5 mg via ORAL
  Filled 2015-02-22: qty 1

## 2015-02-22 MED ORDER — FENTANYL CITRATE (PF) 250 MCG/5ML IJ SOLN
INTRAMUSCULAR | Status: AC
  Filled 2015-02-22: qty 5

## 2015-02-22 MED ORDER — ONDANSETRON HCL 4 MG/2ML IJ SOLN
INTRAMUSCULAR | Status: AC
  Filled 2015-02-22: qty 2

## 2015-02-22 MED ORDER — PROMETHAZINE HCL 25 MG/ML IJ SOLN: 6.2500 mg | INTRAMUSCULAR | Status: DC | PRN

## 2015-02-22 MED ORDER — MIDAZOLAM HCL 2 MG/2ML IJ SOLN
INTRAMUSCULAR | Status: AC
  Filled 2015-02-22: qty 2

## 2015-02-22 MED ORDER — ONDANSETRON HCL 4 MG/2ML IJ SOLN
4.0000 mg | Freq: Four times a day (QID) | INTRAMUSCULAR | Status: DC | PRN
  Administered 2015-02-22: 4 mg via INTRAVENOUS

## 2015-02-22 MED ORDER — BUPIVACAINE-EPINEPHRINE (PF) 0.5% -1:200000 IJ SOLN
INTRAMUSCULAR | Status: DC | PRN
  Administered 2015-02-22: 30 mL via PERINEURAL

## 2015-02-22 MED FILL — Heparin Sodium (Porcine) 2 Unit/ML in Sodium Chloride 0.9%: INTRAMUSCULAR | Qty: 1000 | Status: AC

## 2015-02-22 MED FILL — Lidocaine HCl Local Preservative Free (PF) Inj 1%: INTRAMUSCULAR | Qty: 30 | Status: AC

## 2015-02-22 SURGICAL SUPPLY — 34 items
BANDAGE ELASTIC 4 VELCRO ST LF (GAUZE/BANDAGES/DRESSINGS) ×2 IMPLANT
BANDAGE ELASTIC 6 VELCRO ST LF (GAUZE/BANDAGES/DRESSINGS) IMPLANT
BLADE AVERAGE 25X9 (BLADE) ×1 IMPLANT
BNDG GAUZE ELAST 4 BULKY (GAUZE/BANDAGES/DRESSINGS) ×1 IMPLANT
CANISTER SUCTION 2500CC (MISCELLANEOUS) ×2 IMPLANT
CLIP TI MEDIUM 6 (CLIP) IMPLANT
DRAPE EXTREMITY T 121X128X90 (DRAPE) ×2 IMPLANT
DRSG ADAPTIC 3X8 NADH LF (GAUZE/BANDAGES/DRESSINGS) ×2 IMPLANT
ELECT REM PT RETURN 9FT ADLT (ELECTROSURGICAL) ×2
ELECTRODE REM PT RTRN 9FT ADLT (ELECTROSURGICAL) ×1 IMPLANT
GAUZE SPONGE 4X4 12PLY STRL (GAUZE/BANDAGES/DRESSINGS) ×1 IMPLANT
GAUZE SPONGE 4X4 16PLY XRAY LF (GAUZE/BANDAGES/DRESSINGS) ×1 IMPLANT
GLOVE BIOGEL PI IND STRL 6.5 (GLOVE) IMPLANT
GLOVE BIOGEL PI IND STRL 7.0 (GLOVE) IMPLANT
GLOVE BIOGEL PI IND STRL 7.5 (GLOVE) ×1 IMPLANT
GLOVE BIOGEL PI INDICATOR 6.5 (GLOVE) ×1
GLOVE BIOGEL PI INDICATOR 7.0 (GLOVE) ×1
GLOVE BIOGEL PI INDICATOR 7.5 (GLOVE) ×1
GLOVE SURG SS PI 7.0 STRL IVOR (GLOVE) ×2 IMPLANT
GLOVE SURG SS PI 7.5 STRL IVOR (GLOVE) ×2 IMPLANT
GOWN STRL REUS W/ TWL LRG LVL3 (GOWN DISPOSABLE) ×1 IMPLANT
GOWN STRL REUS W/ TWL XL LVL3 (GOWN DISPOSABLE) ×1 IMPLANT
GOWN STRL REUS W/TWL LRG LVL3 (GOWN DISPOSABLE) ×2
GOWN STRL REUS W/TWL XL LVL3 (GOWN DISPOSABLE) ×2
KIT BASIN OR (CUSTOM PROCEDURE TRAY) ×2 IMPLANT
KIT ROOM TURNOVER OR (KITS) ×2 IMPLANT
NS IRRIG 1000ML POUR BTL (IV SOLUTION) ×2 IMPLANT
PACK GENERAL/GYN (CUSTOM PROCEDURE TRAY) ×2 IMPLANT
PAD ARMBOARD 7.5X6 YLW CONV (MISCELLANEOUS) ×4 IMPLANT
SPONGE GAUZE 4X4 12PLY STER LF (GAUZE/BANDAGES/DRESSINGS) ×1 IMPLANT
SUT BONE WAX W31G (SUTURE) IMPLANT
SUT ETHILON 3 0 PS 1 (SUTURE) ×4 IMPLANT
UNDERPAD 30X30 INCONTINENT (UNDERPADS AND DIAPERS) ×2 IMPLANT
WATER STERILE IRR 1000ML POUR (IV SOLUTION) ×2 IMPLANT

## 2015-02-22 NOTE — Transfer of Care (Signed)
Immediate Anesthesia Transfer of Care Note  Patient: Eric Price  Procedure(s) Performed: Procedure(s): AMPUTATION LEFT GREAT TOE (Left)  Patient Location: PACU  Anesthesia Type:MAC and MAC combined with regional for post-op pain  Level of Consciousness: awake, alert , oriented, patient cooperative and responds to stimulation  Airway & Oxygen Therapy: Patient Spontanous Breathing and Patient connected to nasal cannula oxygen  Post-op Assessment: Report given to RN, Post -op Vital signs reviewed and stable and Patient moving all extremities X 4  Post vital signs: stable  Last Vitals:  Filed Vitals:   02/22/15 0553  BP: 131/102  Pulse: 96  Temp: 37.1 C  Resp:     Complications: No apparent anesthesia complications

## 2015-02-22 NOTE — Anesthesia Postprocedure Evaluation (Signed)
Anesthesia Post Note  Patient: Eric Price  Procedure(s) Performed: Procedure(s) (LRB): AMPUTATION LEFT GREAT TOE (Left)  Anesthesia type: MAC  Patient location: PACU  Post pain: Pain level controlled  Post assessment: Patient's Cardiovascular Status Stable  Last Vitals:  Filed Vitals:   02/22/15 1545  BP:   Pulse: 94  Temp: 36.9 C  Resp: 18    Post vital signs: Reviewed and stable  Level of consciousness: sedated  Complications: No apparent anesthesia complications

## 2015-02-22 NOTE — OR Nursing (Signed)
Dr. Tobias Alexander informed of CBG, no new orders, no tx in PACU

## 2015-02-22 NOTE — Interval H&P Note (Signed)
History and Physical Interval Note:  02/22/2015 2:04 PM  Eric Price  has presented today for surgery, with the diagnosis of Ischemic left lower extremity M62.89  The various methods of treatment have been discussed with the patient and family. After consideration of risks, benefits and other options for treatment, the patient has consented to  Procedure(s): AMPUTATION LEFT GREAT TOE (Left) as a surgical intervention .  The patient's history has been reviewed, patient examined, no change in status, stable for surgery.  I have reviewed the patient's chart and labs.  Questions were answered to the patient's satisfaction.     Annamarie Major

## 2015-02-22 NOTE — Op Note (Signed)
    Patient name: Eric Price MRN: [id number] DOB: 27-May-1951 Sex: male  06-10-2015 - 02/22/2015 Pre-operative Diagnosis: ischemic left great toe Post-operative diagnosis:  Same Surgeon:  Annamarie Major Assistants:  None  Procedure:   Amputation of left great toe including metatarsal head Anesthesia:  Ankle block Blood Loss:  See anesthesia record Specimens:  None  Findings:  Bone appeared healthy.  The tissue was viable.  There was decreased capillary bleeding.  Indications:  The patient has recently undergone removal of a ingrown toenail.  He has developed a ischemic change at this site.  He underwent angiography which revealed that he at some point in time has embolized to his tibial vessels.  There is no named vessel across the ankle.  The duration of his symptoms suggest that this is a chronic process and would not benefit from revascularization.  I had a lengthy, sedation with the family and the patient regarding the level of amputation.  We have decided to first proceed with a great toe amputation.  He understands that if this does not heal that he would need a more proximal amputation  Procedure:  The patient was identified in the holding area and taken to [address]  The patient was then placed supine on the table. Ankle block anesthesia was administered.  The patient was prepped and draped in the usual sterile fashion.  A time out was called and antibiotics were administered.  A tennis racquet type incision was made around the left great toe.  A 10 blade was used to cut down to the bone.  The bone was then transected with large bone cutters.  There was some capillary bleeding however there was modest.  The bone was brittle and healthy.  I used [employer] to debride back proximal to the metatarsal head.  The metatarsal head was resected.  I used a rasp to make sure the bone edges were not jagged.  The wound was then irrigated.  No gross purulence was identified.  The incision was closed  with interrupted 30 vertical mattress nylon sutures.  Sterile dressings were applied.   Disposition:  To PACU in stable condition.   Theotis Burrow, M.D. [facility] Office: [phone] Pager:  [phone]

## 2015-02-22 NOTE — Anesthesia Procedure Notes (Addendum)
Anesthesia Regional Block:  Popliteal block  Pre-Anesthetic Checklist: ,, timeout performed, Correct Patient, Correct Site, Correct Laterality, Correct Procedure, Correct Position, site marked, Risks and benefits discussed,  Surgical consent,  Pre-op evaluation,  At surgeon's request and post-op pain management  Laterality: Left  Prep: chloraprep       Needles:  Injection technique: Single-shot  Needle Type: Echogenic Stimulator Needle          Additional Needles:  Procedures: ultrasound guided (picture in chart) and nerve stimulator Popliteal block  Nerve Stimulator or Paresthesia:  Response: plantar flexion, 0.45 mA,   Additional Responses:   Narrative:  Start time: 02/22/2015 2:19 PM End time: 02/22/2015 2:29 PM Injection made incrementally with aspirations every 5 mL.  Performed by: Personally  Anesthesiologist: Duane Boston  Additional Notes: A functioning IV was confirmed and monitors were applied.  Sterile prep and drape, hand hygiene and sterile gloves were used.  Negative aspiration and test dose prior to incremental administration of local anesthetic. The patient tolerated the procedure well.Ultrasound  guidance: relevant anatomy identified, needle position confirmed, local anesthetic spread visualized around nerve(s), vascular puncture avoided.  Image printed for medical record.    Procedure Name: MAC Performed by: Vennie Homans Pre-anesthesia Checklist: Patient identified, Timeout performed, Emergency Drugs available, Suction available and Patient being monitored Oxygen Delivery Method: Simple face mask
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
## 2015-02-22 NOTE — Progress Notes (Signed)
Orthopedic Tech Progress Note Patient Details:  Eric Price Dec 20, 1950 [id number]  Ortho Devices Type of Ortho Device: Postop shoe/boot Ortho Device/Splint Location: LLE [place] shoe Ortho Device/Splint Interventions: Ordered, Application   [place], [place] 02/22/2015, 8:15 PM

## 2015-02-22 NOTE — Anesthesia Preprocedure Evaluation (Addendum)
Anesthesia Evaluation  Patient identified by MRN, date of birth, ID band Patient awake    Reviewed: Allergy & Precautions, NPO status , Patient's Chart, lab work & pertinent test results  History of Anesthesia Complications (+) PONVNegative for: history of anesthetic complications  Airway Mallampati: II  TM Distance: >3 FB Neck ROM: Full    Dental  (+) Caps, Poor Dentition, Dental Advisory Given   Pulmonary neg pulmonary ROS,    Pulmonary exam normal       Cardiovascular hypertension, + angina + CAD and + Peripheral Vascular Disease Normal cardiovascular exam Study Conclusions  - Left ventricle: LVEF is 30 to 35% with diffuse hypokinesis, slightly worse in the infeiror wall No definite thrombus identified. The cavity size was normal. Wall thickness was increased in a pattern of moderate LVH.    Neuro/Psych PSYCHIATRIC DISORDERS Depression negative neurological ROS     GI/Hepatic negative GI ROS, Neg liver ROS,   Endo/Other  diabetesMorbid obesity  Renal/GU negative Renal ROS     Musculoskeletal   Abdominal   Peds  Hematology   Anesthesia Other Findings   Reproductive/Obstetrics                          Anesthesia Physical Anesthesia Plan  ASA: III  Anesthesia Plan: MAC and Regional   Post-op Pain Management:    Induction: Intravenous  Airway Management Planned: Simple Face Mask  Additional Equipment:   Intra-op Plan:   Post-operative Plan:   Informed Consent: I have reviewed the patients History and Physical, chart, labs and discussed the procedure including the risks, benefits and alternatives for the proposed anesthesia with the patient or authorized representative who has indicated his/her understanding and acceptance.   Dental advisory given  Plan Discussed with: CRNA, Anesthesiologist and Surgeon  Anesthesia Plan Comments:       Anesthesia Quick  Evaluation

## 2015-02-22 NOTE — Care Management Note (Signed)
Case Management Note  Patient Details  Name: Eric Price MRN: [id number] Date of Birth: 1950-11-22  Subjective/Objective:   Pt s/p AMPUTATION LEFT GREAT TOE. Pt has family support.                   Action/Plan: CM will continue to monitor for disposition needs.   Bethena Roys, RN 02/22/2015, 4:34 PM

## 2015-02-22 NOTE — Discharge Summary (Deleted)
[facility] Progress Note   Eric Price MCN:[id number] DOB: [birth year] DOA: 05/27/2015 PCP: [name], MD  Brief narrative: This is a 64 year old male with past medical history of PAD (recently diagnosed with left common iliac stenosis as well as occluded posterior and anterior tibial arteries in the left leg), Type 2 DM, HTN, HLD, mycosis fungoides and nonobstructing CAD who presents with a complaint of chest pain.The patient also has an ulcer of the left first toe which occurred after an ingrown toenail was removed. He has been on antibiotics as outpatient for this- he was referred to Dr. Gwenlyn Found and underwent arterial Doppler studies on 5/10 which revealed mild stenosis of the left common iliac artery and occluded posterior and anterior tibial arteries. Dr. Gwenlyn Found had planned to do an angiogram as outpatient on 5/19.   Subjective: Evaluated prior to surgery at this morning-he just received pain medicine and was not having any pain-he had no other complaints. Discussed his condition, 2-D echo findings, angiogram findings and plan in detail with patient and 2 sisters that were present at bedside.  Assessment/Plan: Chest pain -Underwent cardiac cath 5/19 which revealed a distal RCA stenosis of 75% and a mid LAD stenosis of 40% -wall motion abnormalities with moderate global hypokinesia and moderate to severe inferior hypokinesia noted - EF was 25%-cardiology is concerned that the patient likely formed a thrombus in the LV which embolized to his left leg resulting in ischemia of the leg -Echo ordered with Definity to further evaluate for residual LV clot  Active Problems:  Critical limb ischemia/ foot ulcer -Underwent angiogram along with cath on 5/19-according to the report, the patient likely had an embolus secondary to poor LV function mentioned above- suspected to have embolized to profunda femoris in all 3 tibial arteries- will go for BKA  today  Cardiomyopathy-nonischemic -EF noted to be 62%-HUTML uncertain-possibly related to interferon 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 by physicians at [facility] Status: Full code Family Communication: sisters at bedside Disposition Plan: PTOT eval after surgery to determine disposition DVT prophylaxis: Lovenox Consultants: Cardiology, vascular surgery Procedures: 5/19 Cardiac at the transition, abdominal aortogram and left lower extremity angiogram  Antibiotics: Anti-infectives    Start     Dose/Rate Route Frequency Ordered Stop   02/22/15 1200  ceFAZolin (ANCEF) IVPB 1 g/50 mL premix  Status:  Discontinued    Comments:  Send with pt to OR   1 g 100 mL/hr over 30 Minutes Intravenous On call 02/21/15 2041 02/22/15 0931   02/22/15 1200  ceFAZolin (ANCEF) IVPB 2 g/50 mL premix    Comments:  Send with pt to OR   2 g 100 mL/hr over 30 Minutes Intravenous On call 02/22/15 0931 02/23/15 1200   02/21/15 1800  [MAR Hold]  vancomycin (VANCOCIN) IVPB 1000 mg/200 mL premix     (MAR Hold since 02/22/15 1219)   1,000 mg 200 mL/hr over 60 Minutes Intravenous Every 12 hours 02/21/15 0453     02/21/15 0600  [MAR Hold]  ceFEPIme (MAXIPIME) 2 g in  dextrose 5 % 50 mL IVPB     (MAR Hold since 02/22/15 1219)   2 g 100 mL/hr over 30 Minutes Intravenous 3 times per day 02/21/15 0453     02/21/15 0530  vancomycin (VANCOCIN) 1,500 mg in sodium chloride 0.9 % 500 mL IVPB     1,500  mg 250 mL/hr over 120 Minutes Intravenous NOW 02/21/15 0453 02/21/15 0914      Objective: Filed Weights   02/20/15 1934 02/21/15 0100  Weight: 102.059 kg (225 lb) 101.47 kg (223 lb 11.2 oz)    Intake/Output Summary (Last 24 hours) at 02/22/15 1329 Last data filed at 02/22/15 0248  Gross per 24 hour  Intake    600 ml  Output   1650 ml  Net  -1050 ml     Vitals Filed Vitals:   02/21/15 2206 02/21/15 2304 02/22/15 0004 02/22/15 0553  BP: 146/88 119/70 132/76 131/102  Pulse: 92 91 87 96  Temp:    98.8 F (37.1 C)  TempSrc:    Oral  Resp:      Height:      Weight:      SpO2: 98% 98% 97% 96%    Exam:  General:  Pt is alert, not in acute distress  HEENT: No icterus, No thrush  Cardiovascular: regular rate and rhythm, S1/S2 No murmur  Respiratory: clear to auscultation bilaterally   Abdomen: Soft, +Bowel sounds, non tender, non distended, no guarding  MSK: Left first toe swelling with necrosis at the tip/medial part of toe and tenderness to touch, no edema cyanosis or clubbing bilaterally-unable to palpate dorsalis pedis pulse on left foot  Data Reviewed: Basic Metabolic Panel:  Recent Labs Lab 02/19/15 1535 02/20/15 1950 02/21/15 0443 02/22/15 0306  NA 136 137 141 139  K 4.6 5.0 4.0 4.0  CL 101 103 108 109  CO2 24 25 24 23   GLUCOSE 239* 198* 117* 170*  BUN 8 8 8 8   CREATININE 0.99 1.01 0.93 0.99  CALCIUM 9.1 9.4 9.3 9.0   Liver Function Tests: No results for input(s): AST, ALT, ALKPHOS, BILITOT, PROT, ALBUMIN in the last 168 hours. No results for input(s): LIPASE, AMYLASE in the last 168 hours. No results for input(s): AMMONIA in the last 168 hours. CBC:  Recent Labs Lab 02/19/15 1535 02/20/15 1950 02/22/15 0306  WBC 6.5 7.6 6.9  HGB 12.9* 12.4* 11.7*  HCT 38.5* 37.3* 35.3*  MCV 81.4 82.5 83.3  PLT 369 351 340   Cardiac Enzymes:  Recent Labs Lab 02/21/15 0443 02/21/15 1335  CKTOTAL 177 145  CKMB 7.0* 6.0*  TROPONINI 0.37* 0.50*    BNP (last 3 results)  Recent Labs  02/21/15 0443  BNP 95.7    ProBNP (last 3 results) No results for input(s): PROBNP in the last 8760 hours.  CBG:  Recent Labs Lab 02/21/15 2353 02/22/15 0004 02/22/15 0521 02/22/15 0751 02/22/15 1152  GLUCAP 132* 146* 159* 152* 156*    Recent Results (from the past 240 hour(s))  Surgical pcr screen     Status: None   Collection Time: 02/22/15 11:42 AM  Result Value Ref Range Status   MRSA, PCR NEGATIVE NEGATIVE Final   Staphylococcus aureus NEGATIVE NEGATIVE Final    Comment:        The Xpert SA Assay (FDA approved for NASAL specimens in patients over 39 years of age), is one component of a comprehensive surveillance program.  Test performance has been validated by [facility] for patients greater than or  equal to 56 year old. It is not intended to diagnose infection nor to guide or monitor treatment.      Studies: Dg Chest Port 1 View  06-27-202016   CLINICAL DATA:  Shortness of breath and tachycardia beginning today, blood clots in LEFT leg for which patient was scheduled to have surgery tomorrow, at diabetes, hypertension, on chemotherapy for skin cancer  EXAM: PORTABLE CHEST - 1 VIEW  COMPARISON:  Portable exam 2035 hours compared to 10/19/2013  FINDINGS: Upper normal heart size likely accentuated by technique.  Tortuous aorta.  Pulmonary vascularity normal.  Lungs clear.  No pleural effusion or pneumothorax.  Scattered endplate spur formation thoracic spine.  IMPRESSION: No acute abnormalities.   Electronically Signed   By: Lavonia Dana M.D.   On: 006-27-202016 20:55   Ap / Lateral X-ray Left Foot  02/21/2015   CLINICAL DATA:  Pain and swelling left great toe. Diabetic patient with history of ingrown toenail removal.  EXAM: LEFT FOOT - 2 VIEW  COMPARISON:  None.  FINDINGS: No osseous destructive change to suggest osteomyelitis. No erosion or periosteal reaction. 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 Hallux valgus with medial soft tissue prominence at the first metatarsal phalangeal joint suggesting bunion. No fracture or dislocation. No radiopaque foreign body. Plantar calcaneal spur is noted.  IMPRESSION: 1. No acute osseous abnormality radiographic findings of osteomyelitis. 2. Hallux valgus with osteoarthritis of the first metatarsal phalangeal joint medial soft tissue prominence, may reflect bunion.   Electronically Signed   By: Jeb Levering M.D.   On: 02/21/2015 05:02    Scheduled Meds:  Scheduled Meds: . [MAR Hold] acitretin  25 mg Oral Q M,W,F  . [MAR Hold] aspirin EC  325 mg Oral Daily  . [MAR Hold] atorvastatin  80 mg Oral q1800  . [MAR Hold] buPROPion  150 mg Oral Daily  . [MAR Hold] carvedilol  3.125 mg Oral BID WC  .  ceFAZolin (ANCEF) IV  2 g Intravenous On Call  . [MAR Hold] ceFEPime (MAXIPIME) IV  2 g Intravenous 3 times per day  . [MAR Hold] enoxaparin (LOVENOX) injection  40 mg Subcutaneous Daily  . [MAR Hold] ezetimibe  10 mg Oral Daily  . [MAR Hold] feeding supplement (PRO-STAT SUGAR FREE 64)  30 mL Oral BID  . [MAR Hold] gabapentin  300 mg Oral BID  . [MAR Hold] insulin aspart  0-9 Units Subcutaneous 6 times per day  . [MAR Hold] losartan  50 mg Oral Daily  . [MAR Hold] nitroGLYCERIN  0.5 inch Topical 4 times per day  . [MAR Hold] sertraline  100 mg Oral Daily  . [MAR Hold] tamsulosin  0.4 mg Oral Daily  . [MAR Hold] vancomycin  1,000 mg Intravenous Q12H   Continuous Infusions: . lactated ringers 50 mL/hr at 02/22/15 1255    Time spent on care of this patient: 35 minutes   Girard, MD 02/22/2015, 1:29 PM    [facility] Office  [phone] Pager - Text Page per [email or web address]  If 7PM-7AM, please contact night-coverage [email or web address]

## 2015-02-22 NOTE — Progress Notes (Signed)
Patient is a 64 yo who has a history of PAD, DM, HTN, HL and nonobstructive CAD by cath in Jan 2015 (LAD 30 to 40%; LCx without signif dz; RCA with 50% distal stensosi; LVEF 45%). Patient presented to ER with CP --> mild Troponin elevation --> cath with moderate-severe 1 V disease, but severely reduced LVEF, PV Angio with occluded LLE arterial flow --> seen by Vasc Sgx --> plan is L toe amputation vs. BKA today.  Subjective:  Pretty groggy from narcotics - but breathing is better No more CP.  Slept with head of bead up - but not sure if orthopnea.  Objective:  Vital Signs in the last 24 hours: Temp:  [97.8 F (36.6 C)-98.8 F (37.1 C)] 98.8 F (37.1 C) (05/20 0553) Pulse Rate:  [73-96] 96 (05/20 0553) BP: (110-149)/(65-102) 131/102 mmHg (05/20 0553) SpO2:  [95 %-100 %] 96 % (05/20 0553)  Intake/Output from previous day: 05/19 0701 - 05/20 0700 In: 840 [P.O.:490; IV Piggyback:350] Out: 1650 [Urine:1650] Intake/Output from this shift:    Physical Exam: General appearance: groggy from pain meds, but arousable & Oriented x 3; NAD Neck: no adenopathy, no carotid bruit, supple, symmetrical, trachea midline and + mild JVD Lungs: clear to auscultation bilaterally and normal percussion bilaterally Heart: prominent apical impulse, regular rate and rhythm, S1, S2 normal and S4 present Abdomen: soft, non-tender; bowel sounds normal; no masses,  no organomegaly Extremities: extremities normal, atraumatic, no cyanosis or edema Pulses: Left Dorsalis Pedis absent Left Posterior Tibial barely palpable Neurologic: Grossly normal  Lab Results:  Recent Labs  02/20/15 1950 02/22/15 0306  WBC 7.6 6.9  HGB 12.4* 11.7*  PLT 351 340    Recent Labs  02/21/15 0443 02/22/15 0306  NA 141 139  K 4.0 4.0  CL 108 109  CO2 24 23  GLUCOSE 117* 170*  BUN 8 8  CREATININE 0.93 0.99    Recent Labs  02/21/15 0443 02/21/15 1335  TROPONINI 0.37* 0.50*   Hepatic Function Panel No results  for input(s): PROT, ALBUMIN, AST, ALT, ALKPHOS, BILITOT, BILIDIR, IBILI in the last 72 hours.  Recent Labs  02/21/15 0443  CHOL 195   No results for input(s): PROTIME in the last 72 hours.  Imaging: no new studies  CV Studies: 5/19  PV Angiographic Data:   1: Abdominal aortogram-the distal dominant aorta iliac bifurcation were free of significant atherosclerotic changes  2: Left lower extremity-the left profunda femoris was occluded with what appeared to be thrombus. The SFA was free of significant disease. The left anterior tibial and peroneal were occluded as well. There was slow flow down the posterior tibial down to the level of the ankle. There is no flow to the foot.  IMPRESSION:Mr. Arenson has what appears to be thromboembolic phenomenon to his left lower extremity involving his profunda femoris and all 3 tibial arteries.     CORONARY ANGIOGRAPHIC RESULTS:   1. Left main; normal  2. LAD; 40% proximal 3. Left circumflex; nondominant and normal. There was a moderate sized ramus branch that was normal.  4. Right coronary artery; dominant with a 70% focal calcified lesion just proximal to the takeoff of the PDA. 5. Left ventriculography; RAO left ventriculogram was performed using  25 mL of Visipaque dye at 12 mL/second. The overall LVEF estimated  25-30 % With wall motion abnormalities moderate global hypokinesia with moderate to severe inferior hypokinesia  IMPRESSION: Mr. Wulf has a nonischemic cardio myopathy. His EF is 25% range. He does have  a moderate distal RCA lesion however I think this is unrelated to his current situation and by cath January 2015 he had a 50% lesion there already. His LV dysfunction is out of proportion to the amount of CAD. I'm concerned that he may have had a thromboembolic phenomenon from his LV involving his left lower extremity resulting in critical limb ischemia. He will be a 2-D echo with Definity contrast to assess whether or not he has  residual clot in his LV. Medical therapy will be recommended.    ECHO: LVEF is 30 to 35% (Decreased from ~45% in 2014) with diffuse hypokinesis,slightly worse in the infeiror wall No definite thrombus identified. The cavity size was normal. Wall thickness wasincreased in a pattern of moderate LVH. Doppler parameters are consistent with abnormal left ventricular relaxation (grade 1diastolic dysfunction).  Assessment/Plan:  Principal Problem:   Chest pain Active Problems:   Critical lower limb ischemia   Diabetic foot ulcer   Non-ischemic cardiomyopathy: EF ~30-25%   Demand ischemia of myocardium   Embolic disease of toe   Essential hypertension   Diabetes mellitus type 2, uncontrolled   Coronary artery disease, non-occlusive: mod LAD, ~60-70& dRCA   Hyperlipidemia  Unfortunate gentleman with new Dx of NICM (out of proportion to CAD) with severe PAD - critical limb ischemia.  The sequence of events is difficult to determine - ? If he developed an LV thrombus with cardioembolic occlusion of LLE tibial arteries.  - unfortunately, it would appear that he is headed for BKA  NICM - CXR was clear - indicating no notable Pulm edema (LVEDP 22 mmHg - not overly high)  On ARB already, Coreg started yesterday - titrate according to BP  Not currently on diuretic, but with EDP of 22 mmHg, may need standing PO dose (& if he truly has orthopnea, perhaps at least 1 IV dose).  Will need reassessment of EF in ~3 months re: consideration for AICD.  CAD - on BB, statin & ASA,   Has inferior HK with RCA lesion (PCI not done, but consider ischemic eval with non-invasive ST as OP)>  + Troponin, but CP was very atypical - suspect this is CHF related demand ischemia.  HTN - relatively controlled with current meds  DM - per [facility], Abx coverage per primary [facility] Svc     Khyler Eschmann W 02/22/2015, 12:28 PM

## 2015-02-22 NOTE — Progress Notes (Signed)
Spoke with Dr. Trula Slade about clarification of what procedure is being done today. Surgery schedule states left below the knee amputation and consent is for left great toe amputation. Dr. Trula Slade states is only doing a left great toe amputation. I notified OR of changes. Eric Price

## 2015-02-22 NOTE — Progress Notes (Signed)
Notified pt and family of consent for left great toe amputation and they stated they were to the understanding of that he was going to have a L BKA. Pt stated that is what he wanted. Notified Dr. Trula Slade of pt wishes and stated he would be willing to do the BKA if that was the pt's wishes. Pt and family wanted to speak with MD prior to signing consent. Notified Dr. Trula Slade. He stated he would speak to pt and family upon arrival to short stay. When I went back to notify pt. Family and pt said they now just wanted to do toe amputation. Called OR to notify Dr. Trula Slade, they instructed me to send family with him to short stay and Dr. Trula Slade will meet pt and family to discuss prior to surgery. Consent will be signed there. Carroll Kinds RN

## 2015-02-23 ENCOUNTER — Inpatient Hospital Stay (HOSPITAL_COMMUNITY): Payer: 59

## 2015-02-23 DIAGNOSIS — R079 Chest pain, unspecified: Secondary | ICD-10-CM

## 2015-02-23 LAB — GLUCOSE, CAPILLARY
GLUCOSE-CAPILLARY: 194 mg/dL — AB (ref 65–99)
Glucose-Capillary: 154 mg/dL — ABNORMAL HIGH (ref 65–99)
Glucose-Capillary: 208 mg/dL — ABNORMAL HIGH (ref 65–99)
Glucose-Capillary: 186 mg/dL — ABNORMAL HIGH (ref 65–99)
Glucose-Capillary: 208 mg/dL — ABNORMAL HIGH (ref 65–99)

## 2015-02-23 LAB — BASIC METABOLIC PANEL
ANION GAP: 7 (ref 5–15)
BUN: 7 mg/dL (ref 6–20)
CO2: 28 mmol/L (ref 22–32)
Calcium: 9.2 mg/dL (ref 8.9–10.3)
Chloride: 104 mmol/L (ref 101–111)
Creatinine, Ser: 0.84 mg/dL (ref 0.61–1.24)
GFR calc non Af Amer: >60 mL/min (ref >60)
Glucose, Bld: 169 mg/dL — ABNORMAL HIGH (ref 65–99)
POTASSIUM: 4.2 mmol/L (ref 3.5–5.1)
Sodium: 139 mmol/L (ref 135–145)

## 2015-02-23 LAB — CBC
HCT: 37 % — ABNORMAL LOW (ref 39.0–52.0)
HEMOGLOBIN: 12.3 g/dL — AB (ref 13.0–17.0)
MCH: 27.5 pg (ref 26.0–34.0)
MCHC: 33.2 g/dL (ref 30.0–36.0)
MCV: 82.6 fL (ref 78.0–100.0)
Platelets: 369 10*3/uL (ref 150–400)
RBC: 4.48 MIL/uL (ref 4.22–5.81)
RDW: 12.8 % (ref 11.5–15.5)
WBC: 7.2 10*3/uL (ref 4.0–10.5)

## 2015-02-23 MED ORDER — INSULIN ASPART 100 UNIT/ML ~~LOC~~ SOLN
0.0000 [IU] | Freq: Three times a day (TID) | SUBCUTANEOUS | Status: DC
  Administered 2015-02-23 (×2): 2 [IU] via SUBCUTANEOUS
  Administered 2015-02-24: 1 [IU] via SUBCUTANEOUS

## 2015-02-23 MED ORDER — CARVEDILOL 6.25 MG PO TABS
6.2500 mg | ORAL_TABLET | Freq: Two times a day (BID) | ORAL | Status: DC
  Administered 2015-02-23 – 2015-02-25 (×5): 6.25 mg via ORAL
  Filled 2015-02-23 (×5): qty 1

## 2015-02-23 MED ORDER — GABAPENTIN 600 MG PO TABS
300.0000 mg | ORAL_TABLET | Freq: Three times a day (TID) | ORAL | Status: DC
Start: 2015-02-23 — End: 2015-02-25
  Administered 2015-02-23 – 2015-02-25 (×8): 300 mg via ORAL
  Filled 2015-02-23 (×8): qty 1

## 2015-02-23 MED ORDER — PERFLUTREN LIPID MICROSPHERE
1.0000 mL | INTRAVENOUS | Status: AC | PRN
  Administered 2015-02-23: 2 mL via INTRAVENOUS
  Filled 2015-02-23: qty 10

## 2015-02-23 MED ORDER — INSULIN DETEMIR 100 UNIT/ML ~~LOC~~ SOLN
25.0000 [IU] | Freq: Every day | SUBCUTANEOUS | Status: DC
  Administered 2015-02-23 – 2015-02-24 (×2): 25 [IU] via SUBCUTANEOUS
  Filled 2015-02-23 (×3): qty 0.25

## 2015-02-23 NOTE — Progress Notes (Signed)
   VASCULAR SURGERY ASSESSMENT & PLAN:  * 1 Day Post-Op s/p: amputation of left great toe. Amputation site looks fine.  * PTx: PWB left foot, heel only with Darko shoe.   SUBJECTIVE: Pain adequately controlled.   PHYSICAL EXAM: Filed Vitals:   02/23/15 3953 02/23/15 0609 02/23/15 0640 02/23/15 0815  BP:  119/72 122/84 120/80  Pulse: 88 93 83 83  Temp:  98.7 F (37.1 C)    TempSrc:  Oral    Resp:      Height:      Weight:      SpO2:  100% 97%    Toe amputation site inspected and looks good so far.   LABS: Lab Results  Component Value Date   WBC 7.2 02/23/2015   HGB 12.3* 02/23/2015   HCT 37.0* 02/23/2015   MCV 82.6 02/23/2015   PLT 369 02/23/2015   Lab Results  Component Value Date   CREATININE 0.84 02/23/2015   Lab Results  Component Value Date   INR 0.96 02/19/2015   CBG (last 3)   Recent Labs  02/22/15 2330 02/23/15 0534 02/23/15 0745  GLUCAP 194* 154* 208*    Principal Problem:   Chest pain Active Problems:   Essential hypertension   Hyperlipidemia   Diabetes mellitus type 2, uncontrolled   Critical lower limb ischemia   Diabetic foot ulcer   Non-ischemic cardiomyopathy: EF ~30-25%   Demand ischemia of myocardium   Coronary artery disease, non-occlusive: mod LAD, ~20-23& dRCA   Embolic disease of toe   Ischemic ulcer of toe of left foot    Gae Gallop Beeper: [phone] 02/23/2015

## 2015-02-23 NOTE — Progress Notes (Signed)
Patient is a 64 yo who has a history of PAD, DM, HTN, HL and nonobstructive CAD by cath in Jan 2015 (LAD 30 to 40%; LCx without signif dz; RCA with 50% distal stensosi; LVEF 45%). Patient presented to ER with CP --> mild Troponin elevation --> cath with moderate-severe 1 V disease, but severely reduced LVEF, PV Angio with occluded LLE arterial flow --> seen by Vasc Sgx --> has had toe amputation    Subjective:    Objective:  Vital Signs in the last 24 hours: Temp:  [98.2 F (36.8 C)-98.7 F (37.1 C)] 98.7 F (37.1 C) (05/21 0609) Pulse Rate:  [83-105] 83 (05/21 0815) Resp:  [18] 18 (05/20 1603) BP: (119-154)/(70-95) 120/80 mmHg (05/21 0815) SpO2:  [96 %-100 %] 97 % (05/21 0640) FiO2 (%):  [2 %] 2 % (05/20 1603)  Intake/Output from previous day: 05/20 0701 - 05/21 0700 In: 1652.5 [P.O.:350; I.V.:752.5; IV Piggyback:550] Out: 1075 [Urine:1075] Intake/Output from this shift:    Physical Exam: Physical Exam: Blood pressure 120/80, pulse 83, temperature 98.7 F (37.1 C), temperature source Oral, resp. rate 18, height 5\' 9"  (1.753 m), weight 101.47 kg (223 lb 11.2 oz), SpO2 97 %. General: Well developed, well nourished, in no acute distress.  Head: Normocephalic, atraumatic, sclera non-icteric, mucus membranes are moist  Neck: Supple. Negative for carotid bruits. JVD not elevated.  Lungs: Clear bilaterally to auscultation without wheezes, rales, or rhonchi. Breathing is unlabored.  Heart: RRR with S1 S2. No murmurs, rubs, or gallops appreciated.  Abdomen: Soft, non-tender, non-distended with normoactive bowel sounds. No hepatomegaly. No rebound/guarding. No obvious abdominal masses.  Msk:  Strength and tone appear normal for age.  Extremities: s/p amputation of left great toe.   Neuro: normal , awake , alert  Psych:       Lab Results:  Recent Labs  02/22/15 0306 02/23/15 0554  WBC 6.9 7.2  HGB 11.7* 12.3*  PLT 340 369    Recent Labs  02/22/15 0306  02/23/15 0554  NA 139 139  K 4.0 4.2  CL 109 104  CO2 23 28  GLUCOSE 170* 169*  BUN 8 7  CREATININE 0.99 0.84    Recent Labs  02/21/15 0443 02/21/15 1335  TROPONINI 0.37* 0.50*   Hepatic Function Panel No results for input(s): PROT, ALBUMIN, AST, ALT, ALKPHOS, BILITOT, BILIDIR, IBILI in the last 72 hours.  Recent Labs  02/21/15 0443  CHOL 195   No results for input(s): PROTIME in the last 72 hours.  Imaging: no new studies  CV Studies: 5/19  PV Angiographic Data:   1: Abdominal aortogram-the distal dominant aorta iliac bifurcation were free of significant atherosclerotic changes  2: Left lower extremity-the left profunda femoris was occluded with what appeared to be thrombus. The SFA was free of significant disease. The left anterior tibial and peroneal were occluded as well. There was slow flow down the posterior tibial down to the level of the ankle. There is no flow to the foot.  IMPRESSION:Mr. Parisien has what appears to be thromboembolic phenomenon to his left lower extremity involving his profunda femoris and all 3 tibial arteries.     CORONARY ANGIOGRAPHIC RESULTS:   1. Left main; normal  2. LAD; 40% proximal 3. Left circumflex; nondominant and normal. There was a moderate sized ramus branch that was normal.  4. Right coronary artery; dominant with a 70% focal calcified lesion just proximal to the takeoff of the PDA. 5. Left ventriculography; RAO left ventriculogram was performed using  25 mL of Visipaque dye at 12 mL/second. The overall LVEF estimated  25-30 % With wall motion abnormalities moderate global hypokinesia with moderate to severe inferior hypokinesia  IMPRESSION: Mr. Quadros has a nonischemic cardio myopathy. His EF is 25% range. He does have a moderate distal RCA lesion however I think this is unrelated to his current situation and by cath January 2015 he had a 50% lesion there already. His LV dysfunction is out of proportion to the amount of  CAD. I'm concerned that he may have had a thromboembolic phenomenon from his LV involving his left lower extremity resulting in critical limb ischemia. He will be a 2-D echo with Definity contrast to assess whether or not he has residual clot in his LV. Medical therapy will be recommended.    ECHO: LVEF is 30 to 35% (Decreased from ~45% in 2014) with diffuse hypokinesis,slightly worse in the infeiror wall No definite thrombus identified. The cavity size was normal. Wall thickness wasincreased in a pattern of moderate LVH. Doppler parameters are consistent with abnormal left ventricular relaxation (grade 1diastolic dysfunction).  Assessment/Plan:  Principal Problem:   Chest pain Active Problems:   Essential hypertension   Hyperlipidemia   Diabetes mellitus type 2, uncontrolled   Critical lower limb ischemia   Diabetic foot ulcer   Non-ischemic cardiomyopathy: EF ~30-25%   Demand ischemia of myocardium   Coronary artery disease, non-occlusive: mod LAD, ~74-16& dRCA   Embolic disease of toe   Ischemic ulcer of toe of left foot  1.  Acute on chronic systolic congestive heart failure. The patient has been on interferon a which can cause acute systolic congestive heart failure. He's currently on carvedilol 3.125 mg twice a day, will increase to 6.25 mg twice a day. Losartan 50 mg once a day We'll consider the addition of spironolactone in the future. We'll continue with aggressive medical therapy. If his left ventricle systolic function doesn't improve after 3 months he'll need to be considered for ICD. At this time there is no indication for a LifeVest.  2. Acute thromboembolus: The patient had a thrombus embolic event which resulted in an ischemic left great toe and septal and crepitation. He also probably had significant peripheral vascular disease. He has acute reduction in LV systolic function. I think that he should be treated with long-term anticoagulation given this in the area.  We are performing an echocardiogram with Definity to look for LV thrombus which should be done today.  I think that some consideration should be given to having him on anticoagulation even if of thrombus is not seen.  3. Coronary artery disease: The patient has moderate coronary artery disease. Continue statin, beta blocker and aspirin.  vc   LOS: 1 day   Thayer Headings 02/23/2015, 10:18 AM

## 2015-02-23 NOTE — Progress Notes (Signed)
[facility] Progress Note   Joann Kulpa WUJ:[id number] DOB: 1951-09-29 DOA: 02/21/2015 PCP: [name], MD  Brief narrative: This is a 64 year old male with past medical history of PAD (recently diagnosed with left common iliac stenosis as well as occluded posterior and anterior tibial arteries in the left leg), Type 2 DM, HTN, HLD, mycosis fungoides and nonobstructing CAD who presents with a complaint of chest pain.The patient also has an ulcer of the left first toe which occurred after an ingrown toenail was removed. He has been on antibiotics as outpatient for this- he was referred to Dr. Gwenlyn Found and underwent arterial Doppler studies on 5/10 which revealed mild stenosis of the left common iliac artery and occluded posterior and anterior tibial arteries. Dr. Gwenlyn Found had planned to do an angiogram as outpatient on 5/19.   Subjective: Evaluated prior to surgery at this morning-he just received pain medicine and was not having any pain-he had no other complaints. Discussed his condition, 2-D echo findings, angiogram findings and plan in detail with patient and 2 sisters that were present at bedside.  Assessment/Plan: Chest pain -Underwent cardiac cath 5/19 which revealed a distal RCA stenosis of 75% and a mid LAD stenosis of 40% -wall motion abnormalities with moderate global hypokinesia and moderate to severe inferior hypokinesia noted - EF was 25%-cardiology is concerned that the patient likely formed a thrombus in the LV which embolized to his left leg resulting in ischemia of the leg -Echo ordered with Definity to further evaluate for residual LV clot  Active Problems:  Critical limb ischemia/ foot ulcer -Underwent angiogram along with cath on 5/19-according to the report, the patient likely had an embolus secondary to poor LV function mentioned above- suspected to have embolized to profunda femoris in all 3 tibial arteries- will go for BKA  today  Cardiomyopathy-nonischemic -EF noted to be 95%-AOZHY uncertain-possibly related to interferon 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 by physicians at [place] Status: Full code Family Communication: sisters at bedside Disposition Plan: PTOT eval after surgery to determine disposition DVT prophylaxis: Lovenox Consultants: Cardiology, vascular surgery Procedures: 5/19 Cardiac at the transition, abdominal aortogram and left lower extremity angiogram  Antibiotics: Anti-infectives    Start     Dose/Rate Route Frequency Ordered Stop   02/22/15 1200  ceFAZolin (ANCEF) IVPB 1 g/50 mL premix  Status:  Discontinued    Comments:  Send with pt to OR   1 g 100 mL/hr over 30 Minutes Intravenous On call 02/21/15 2041 02/22/15 0931   02/22/15 1200  ceFAZolin (ANCEF) IVPB 2 g/50 mL premix    Comments:  Send with pt to OR   2 g 100 mL/hr over 30 Minutes Intravenous On call 02/22/15 0931 02/22/15 1430   02/21/15 1800  vancomycin (VANCOCIN) IVPB 1000 mg/200 mL premix     1,000 mg 200 mL/hr over 60 Minutes Intravenous Every 12 hours 02/21/15 0453     02/21/15 0600  ceFEPIme (MAXIPIME) 2 g in dextrose 5 % 50 mL IVPB     2 g 100  mL/hr over 30 Minutes Intravenous 3 times per day 02/21/15 0453     02/21/15 0530  vancomycin (VANCOCIN) 1,500 mg in sodium chloride 0.9 % 500 mL IVPB     1,500 mg 250 mL/hr over 120 Minutes Intravenous NOW 02/21/15 0453 02/21/15 0914       Objective: Filed Weights   02/20/15 1934 02/21/15 0100  Weight: 102.059 kg (225 lb) 101.47 kg (223 lb 11.2 oz)    Intake/Output Summary (Last 24 hours) at 02/23/15 0716 Last data filed at 02/23/15 3009  Gross per 24 hour  Intake 1652.5 ml  Output   1075 ml  Net  577.5 ml     Vitals Filed Vitals:   02/23/15 0607 02/23/15 0608 02/23/15 0609 02/23/15 0640  BP:   119/72 122/84  Pulse: 92 88 93 83  Temp:   98.7 F (37.1 C)   TempSrc:   Oral   Resp:      Height:      Weight:      SpO2:   100% 97%    Exam:  General:  Pt is alert, not in acute distress  HEENT: No icterus, No thrush  Cardiovascular: regular rate and rhythm, S1/S2 No murmur  Respiratory: clear to auscultation bilaterally   Abdomen: Soft, +Bowel sounds, non tender, non distended, no guarding  MSK: Left first toe swelling with necrosis at the tip/medial part of toe and tenderness to touch, no edema cyanosis or clubbing bilaterally-unable to palpate dorsalis pedis pulse on left foot  Data Reviewed: Basic Metabolic Panel:  Recent Labs Lab 02/19/15 1535 02/20/15 1950 02/21/15 0443 02/22/15 0306  NA 136 137 141 139  K 4.6 5.0 4.0 4.0  CL 101 103 108 109  CO2 24 25 24 23   GLUCOSE 239* 198* 117* 170*  BUN 8 8 8 8   CREATININE 0.99 1.01 0.93 0.99  CALCIUM 9.1 9.4 9.3 9.0   Liver Function Tests: No results for input(s): AST, ALT, ALKPHOS, BILITOT, PROT, ALBUMIN in the last 168 hours. No results for input(s): LIPASE, AMYLASE in the last 168 hours. No results for input(s): AMMONIA in the last 168 hours. CBC:  Recent Labs Lab 02/19/15 1535 02/20/15 1950 02/22/15 0306 02/23/15 0554  WBC 6.5 7.6 6.9 7.2  HGB 12.9* 12.4* 11.7* 12.3*  HCT 38.5* 37.3* 35.3* 37.0*  MCV 81.4 82.5 83.3 82.6  PLT 369 351 340 369   Cardiac Enzymes:  Recent Labs Lab 02/21/15 0443 02/21/15 1335  CKTOTAL 177 145  CKMB 7.0* 6.0*  TROPONINI 0.37* 0.50*   BNP (last 3 results)  Recent Labs  02/21/15 0443  BNP  95.7    ProBNP (last 3 results) No results for input(s): PROBNP in the last 8760 hours.  CBG:  Recent Labs Lab 02/22/15 1418 02/22/15 1530 02/22/15 2129 02/22/15 2330 02/23/15 0534  GLUCAP 162* 212* 189* 194* 154*    Recent Results (from the past 240 hour(s))  Surgical pcr screen     Status: None   Collection Time: 02/22/15 11:42 AM  Result Value Ref Range Status   MRSA, PCR NEGATIVE NEGATIVE Final   Staphylococcus aureus NEGATIVE NEGATIVE Final    Comment:        The Xpert SA Assay (FDA approved for NASAL specimens in patients over 45 years of age), is one component of a comprehensive surveillance program.  Test performance has been validated by [facility] for patients greater than or equal to 55 year old. It is not intended to diagnose infection nor to guide or  monitor treatment.      Studies: No results found.  Scheduled Meds:  Scheduled Meds: . acitretin  25 mg Oral Q M,W,F  . atorvastatin  80 mg Oral q1800  . buPROPion  150 mg Oral Daily  . carvedilol  3.125 mg Oral BID WC  . ceFEPime (MAXIPIME) IV  2 g Intravenous 3 times per day  . enoxaparin (LOVENOX) injection  40 mg Subcutaneous Daily  . ezetimibe  10 mg Oral Daily  . feeding supplement (PRO-STAT SUGAR FREE 64)  30 mL Oral BID  . gabapentin  300 mg Oral BID  . insulin aspart  0-9 Units Subcutaneous 6 times per day  . losartan  50 mg Oral Daily  . nitroGLYCERIN  0.5 inch Topical 4 times per day  . ondansetron      . sertraline  100 mg Oral Daily  . tamsulosin  0.4 mg Oral Daily  . vancomycin  1,000 mg Intravenous Q12H   Continuous Infusions:    Time spent on care of this patient: 35 minutes   Edmunds, MD 02/23/2015, 7:16 AM  LOS: 1 day   [facility] Office  [phone] Pager - Text Page per [email or web address]  If 7PM-7AM, please contact night-coverage [email or web address]

## 2015-02-23 NOTE — Progress Notes (Signed)
  Echocardiogram 2D Echocardiogram has been performed.  Lysle Rubens 02/23/2015, 2:38 PM

## 2015-02-23 NOTE — Evaluation (Signed)
Physical Therapy Evaluation Patient Details Name: Eric Price MRN: [id number] DOB: 1950-12-13 Today's Date: 02/23/2015   History of Present Illness  Patient is a 64 yo male admitted 02/20/15 with chest pain and Lt foot/leg pain.  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  PMH:  DM, PAD, HTN, HLD, CAD, Mycosis fungoides  Clinical Impression  Patient presents with problems listed below.  Will benefit from acute PT to maximize functional mobility prior to discharge home with wife.  Will need RW for home use.    Follow Up Recommendations No PT follow up;Supervision/Assistance - 24 hour    Equipment Recommendations  Rolling walker with 5" wheels    Recommendations for Other Services       Precautions / Restrictions Precautions Precautions: None Required Braces or Orthoses: Other Brace/Splint Other Brace/Splint: Darco shoe Lt foot Restrictions Weight Bearing Restrictions: Yes LLE Weight Bearing: Partial weight bearing (Heel only)      Mobility  Bed Mobility Overal bed mobility: Needs Assistance Bed Mobility: Supine to Sit;Sit to Supine     Supine to sit: Supervision Sit to supine: Supervision   General bed mobility comments: Supervision for safety.  Patient with good balance with sitting.  Transfers Overall transfer level: Needs assistance Equipment used: Rolling walker (2 wheeled) Transfers: Sit to/from Stand Sit to Stand: Min assist         General transfer comment: Verbal cues for hand placement and technique.  Assist to rise to standing and to steady.    Ambulation/Gait Ambulation/Gait assistance: Min assist Ambulation Distance (Feet): 26 Feet Assistive device: Rolling walker (2 wheeled) Gait Pattern/deviations: Step-to pattern;Decreased stance time - left;Decreased step length - right;Decreased step length - left;Decreased stride length;Decreased weight shift to left;Steppage;Antalgic Gait velocity: Decreased Gait velocity interpretation: Below normal speed  for age/gender General Gait Details: Verbal cues for safe use of RW and gait sequence.  Cues to roll walker rather than lift it off ground to help with balance.  Assist for balance/safety.  Patient fatigues quickly.  Stairs            Wheelchair Mobility    Modified Rankin (Stroke Patients Only)       Balance Overall balance assessment: Needs assistance         Standing balance support: Bilateral upper extremity supported Standing balance-Leahy Scale: Poor                               Pertinent Vitals/Pain Pain Assessment: 0-10 Pain Score: 7  Pain Location: Lt foot/leg Pain Descriptors / Indicators: Aching;Sore Pain Intervention(s): Limited activity within patient's tolerance;Repositioned    Home Living Family/patient expects to be discharged to:: Private residence Living Arrangements: Spouse/significant other Available Help at Discharge: Family;Available 24 hours/day (Wife works.  Can provide 24 hour assist initially) Type of Home: House Home Access: Stairs to enter Entrance Stairs-Rails: Right;Left Entrance Stairs-Number of Steps: 4 Home Layout: Two level;Able to live on main level with bedroom/bathroom Home Equipment: None      Prior Function Level of Independence: Independent         Comments: Drives     Hand Dominance        Extremity/Trunk Assessment   Upper Extremity Assessment: Overall WFL for tasks assessed           Lower Extremity Assessment: LLE deficits/detail   LLE Deficits / Details: Lt foot/ankle bandaged post-op.  Unable to test ankle/foot.  Able to move hip/knee against gravity  Cervical / Trunk Assessment: Normal  Communication   Communication: No difficulties  Cognition Arousal/Alertness: Lethargic (Initially - did increase alertness with time) Behavior During Therapy: WFL for tasks assessed/performed Overall Cognitive Status: Within Functional Limits for tasks assessed                       General Comments      Exercises        Assessment/Plan    PT Assessment Patient needs continued PT services  PT Diagnosis Difficulty walking;Acute pain   PT Problem List Decreased activity tolerance;Decreased balance;Decreased mobility;Decreased knowledge of use of DME;Decreased knowledge of precautions;Pain  PT Treatment Interventions DME instruction;Gait training;Stair training;Functional mobility training;Therapeutic activities;Therapeutic exercise;Patient/family education   PT Goals (Current goals can be found in the Care Plan section) Acute Rehab PT Goals Patient Stated Goal: To return home soon PT Goal Formulation: With patient/family Time For Goal Achievement: 03/02/15 Potential to Achieve Goals: Good    Frequency Min 3X/week   Barriers to discharge Decreased caregiver support Wife works.  Will be able to provide 24 hour assist initially - then return to work.    Co-evaluation               End of Session Equipment Utilized During Treatment: Gait belt (Darco shoe) Activity Tolerance: Patient limited by fatigue;Patient limited by pain Patient left: in bed;with call bell/phone within reach;with family/visitor present Nurse Communication: Mobility status         Time: 9532-0233 PT Time Calculation (min) (ACUTE ONLY): 23 min   Charges:   PT Evaluation $Initial PT Evaluation Tier I: 1 Procedure PT Treatments $Gait Training: 8-22 mins   PT G CodesDespina Pole 03/02/15, 4:59 PM Carita Pian. Sanjuana Kava, [place] Pager [phone]

## 2015-02-23 NOTE — Discharge Summary (Addendum)
[facility] Progress Note   Eric Price CXK:[id number] DOB: 06-Sep-1951 DOA: 2020/05/2215 PCP: [name], MD  Brief narrative: This is a 64 year old male with past medical history of PAD (recently diagnosed with left common iliac stenosis as well as occluded posterior and anterior tibial arteries in the left leg), Type 2 DM, HTN, HLD, mycosis fungoides and nonobstructing CAD who presents with a complaint of chest pain.The patient also has an ulcer of the left first toe which occurred after an ingrown toenail was removed. He has been on antibiotics as outpatient for this- he was referred to Dr. Gwenlyn Found and underwent arterial Doppler studies on 5/10 which revealed mild stenosis of the left common iliac artery and occluded posterior and anterior tibial arteries. Dr. Gwenlyn Found had planned to do an angiogram as outpatient on 5/19.   Subjective: Evaluated this morning-the patient was doing well, without complaints of pain, shortness of breath, nausea vomiting diarrhea and constipation.  Assessment/Plan: Chest pain -Underwent cardiac cath 5/19 which revealed a distal RCA stenosis of 75% and a mid LAD stenosis of 40% -wall motion abnormalities with moderate global hypokinesia and moderate to severe inferior hypokinesia noted - EF was 25%-cardiology is concerned that the patient likely formed a thrombus in the LV which embolized to his left leg resulting in ischemia of the leg  -Echo ordered with Definity to further evaluate for residual LV clot  Active Problems:  Critical limb ischemia/nonhealingfirst toe ischemic ulcer -Underwent angiogram along with cath on 5/19 - suspected to have embolized to profunda femoris in all 3 tibial arteries- -Underwent right first toe amputation on 5/20 -PT eval ordered- surgery has left the following instructions:PWB left foot, heel only with Darko shoe. -Will DC vancomycin and cefepime today which were being given for suspected infection in the  toe  Cardiomyopathy-nonischemic -EF noted to be 97%-WYOVZ uncertain -possibly related to interferon alpha which she is receiving for mycosis fungoides -will need to discontinue for now and return to his oncologist for further evaluation for an alternative medicine - cardiology assisting with management-continue ARB- awaiting echo with Definity to further determine if he has an LV thrombus   Type 2 diabetes mellitus - cont Insulin sliding scale -we'll resume Lantus today -Continue to hold metformin and glyburide   Hypertension - Continue carvedilol and losartan   Hyperlipidemia -Taking Lipitor and Zetia  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 and interferon alpha by physicians at [facility] to hold interferon alpha as it may resulted in the cardiomyopathy-he will go back and discuss this with his physician   Code Status: Full code Family Communication: sisters at bedside Disposition Plan: PTOT eval after surgery to determine disposition DVT prophylaxis: Lovenox Consultants: Cardiology, vascular surgery Procedures: 5/19 Cardiac catheterization, abdominal aortogram and left lower extremity angiogram 5/20 left first toe amputation 5/19 2-D echo  Antibiotics: Anti-infectives    Start     Dose/Rate Route Frequency Ordered Stop   02/22/15 1200  ceFAZolin (ANCEF) IVPB 1 g/50 mL premix  Status:  Discontinued    Comments:  Send with pt to OR   1 g 100 mL/hr over 30 Minutes Intravenous On call 02/21/15 2041 02/22/15 0931   02/22/15 1200  ceFAZolin (ANCEF) IVPB 2 g/50 mL premix    Comments:  Send with pt to OR   2 g 100 mL/hr over 30 Minutes Intravenous On call 02/22/15 0931 02/22/15 1430   02/21/15 1800  vancomycin (VANCOCIN) IVPB 1000 mg/200 mL premix     1,000 mg 200 mL/hr over 60 Minutes  Intravenous Every 12 hours 02/21/15 0453     02/21/15 0600  ceFEPIme (MAXIPIME) 2 g in dextrose 5 % 50 mL IVPB     2 g 100 mL/hr over 30 Minutes Intravenous 3 times per day  02/21/15 0453     02/21/15 0530  vancomycin (VANCOCIN) 1,500 mg in sodium chloride 0.9 % 500 mL IVPB     1,500 mg 250 mL/hr over 120 Minutes Intravenous NOW 02/21/15 0453 02/21/15 0914      Objective: Filed Weights   02/20/15 1934 02/21/15 0100  Weight: 102.059 kg (225 lb) 101.47 kg (223 lb 11.2 oz)    Intake/Output Summary (Last 24 hours) at 02/23/15 1108 Last data filed at 02/23/15 0643  Gross per 24 hour  Intake 1652.5 ml  Output   1075 ml  Net  577.5 ml     Vitals Filed Vitals:   02/23/15 0608 02/23/15 0609 02/23/15 0640 02/23/15 0815  BP:  119/72 122/84 120/80  Pulse: 88 93 83 83  Temp:  98.7 F (37.1 C)    TempSrc:  Oral    Resp:      Height:      Weight:      SpO2:  100% 97%     Exam:  General:  Pt is alert, not in acute distress  HEENT: No icterus, No thrush  Cardiovascular: regular rate and rhythm, S1/S2 No murmur  Respiratory: clear to auscultation bilaterally   Abdomen: Soft, +Bowel sounds, non tender, non distended, no guarding  MSK: No cyanosis clubbing or edema-dressing on left foot not opened  Data Reviewed: Basic Metabolic Panel:  Recent Labs Lab 02/19/15 1535 02/20/15 1950 02/21/15 0443 02/22/15 0306 02/23/15 0554  NA 136 137 141 139 139  K 4.6 5.0 4.0 4.0 4.2  CL 101 103 108 109 104  CO2 24 25 24 23 28   GLUCOSE 239* 198* 117* 170* 169*  BUN 8 8 8 8 7   CREATININE 0.99 1.01 0.93 0.99 0.84  CALCIUM 9.1 9.4 9.3 9.0 9.2   Liver Function Tests: No results for input(s): AST, ALT, ALKPHOS, BILITOT, PROT, ALBUMIN in the last 168 hours. No results for input(s): LIPASE, AMYLASE in the last 168 hours. No results for input(s): AMMONIA in the last 168 hours. CBC:  Recent Labs Lab 02/19/15 1535 02/20/15 1950 02/22/15 0306 02/23/15 0554  WBC 6.5 7.6 6.9 7.2  HGB 12.9* 12.4* 11.7* 12.3*  HCT 38.5* 37.3* 35.3* 37.0*  MCV 81.4 82.5 83.3 82.6  PLT 369 351 340 369   Cardiac Enzymes:  Recent Labs Lab 02/21/15 0443 02/21/15 1335   CKTOTAL 177 145  CKMB 7.0* 6.0*  TROPONINI 0.37* 0.50*   BNP (last 3 results)  Recent Labs  02/21/15 0443  BNP 95.7    ProBNP (last 3 results) No results for input(s): PROBNP in the last 8760 hours.  CBG:  Recent Labs Lab 02/22/15 1530 02/22/15 2129 02/22/15 2330 02/23/15 0534 02/23/15 0745  GLUCAP 212* 189* 194* 154* 208*    Recent Results (from the past 240 hour(s))  Surgical pcr screen     Status: None   Collection Time: 02/22/15 11:42 AM  Result Value Ref Range Status   MRSA, PCR NEGATIVE NEGATIVE Final   Staphylococcus aureus NEGATIVE NEGATIVE Final    Comment:        The Xpert SA Assay (FDA approved for NASAL specimens in patients over 51 years of age), is one component of a comprehensive surveillance program.  Test performance has been validated by [facility]  for patients greater than or equal to 33 year old. It is not intended to diagnose infection nor to guide or monitor treatment.      Studies: No results found.  Scheduled Meds:  Scheduled Meds: . acitretin  25 mg Oral Q M,W,F  . atorvastatin  80 mg Oral q1800  . buPROPion  150 mg Oral Daily  . carvedilol  6.25 mg Oral BID WC  . ceFEPime (MAXIPIME) IV  2 g Intravenous 3 times per day  . enoxaparin (LOVENOX) injection  40 mg Subcutaneous Daily  . ezetimibe  10 mg Oral Daily  . feeding supplement (PRO-STAT SUGAR FREE 64)  30 mL Oral BID  . gabapentin  300 mg Oral TID  . insulin aspart  0-9 Units Subcutaneous TID WC  . insulin detemir  25 Units Subcutaneous Q2200  . losartan  50 mg Oral Daily  . nitroGLYCERIN  0.5 inch Topical 4 times per day  . ondansetron      . sertraline  100 mg Oral Daily  . tamsulosin  0.4 mg Oral Daily  . vancomycin  1,000 mg Intravenous Q12H   Continuous Infusions:    Time spent on care of this patient: 35 minutes   [place], MD 02/23/2015, 11:08 AM  LOS: 1 day   [facility] Office  [phone] Pager - Text Page per [email or web address]  If  7PM-7AM, please contact night-coverage [email or web address]

## 2015-02-24 LAB — GLUCOSE, CAPILLARY
Glucose-Capillary: 147 mg/dL — ABNORMAL HIGH (ref 65–99)
Glucose-Capillary: 149 mg/dL — ABNORMAL HIGH (ref 65–99)
Glucose-Capillary: 174 mg/dL — ABNORMAL HIGH (ref 65–99)
Glucose-Capillary: 181 mg/dL — ABNORMAL HIGH (ref 65–99)
Glucose-Capillary: 224 mg/dL — ABNORMAL HIGH (ref 65–99)

## 2015-02-24 MED ORDER — ASPIRIN 81 MG PO CHEW
81.0000 mg | CHEWABLE_TABLET | Freq: Every day | ORAL | Status: DC
  Administered 2015-02-24 – 2015-02-25 (×2): 81 mg via ORAL
  Filled 2015-02-24 (×2): qty 1

## 2015-02-24 MED ORDER — INSULIN ASPART 100 UNIT/ML ~~LOC~~ SOLN
5.0000 [IU] | Freq: Three times a day (TID) | SUBCUTANEOUS | Status: DC
Start: 2015-02-24 — End: 2015-02-25
  Administered 2015-02-24 – 2015-02-25 (×5): 5 [IU] via SUBCUTANEOUS

## 2015-02-24 NOTE — Progress Notes (Addendum)
[facility]  Subjective  - Doing well over all.  No new complaints   Objective 130/84 96 98.7 F (37.1 C) (Oral) 16 94%  Intake/Output Summary (Last 24 hours) at 02/24/15 7026 Last data filed at 02/24/15 3785  Gross per 24 hour  Intake    720 ml  Output   1000 ml  Net   -280 ml    Doppler AT monophasic Left foot dressing changed No active bleeding, minimal blood on dressing Incision healing well at [facility] point skin edges without erythema  Assessment/Planning: POD # 2 left great toe amputation  Incision healing well Heel WB in darco shoe  COLLINS, EMMA MAUREEN 02/24/2015 8:32 AM --  Laboratory Lab Results:  Recent Labs  02/22/15 0306 02/23/15 0554  WBC 6.9 7.2  HGB 11.7* 12.3*  HCT 35.3* 37.0*  PLT 340 369   BMET  Recent Labs  02/22/15 0306 02/23/15 0554  NA 139 139  K 4.0 4.2  CL 109 104  CO2 23 28  GLUCOSE 170* 169*  BUN 8 7  CREATININE 0.99 0.84  CALCIUM 9.0 9.2    COAG Lab Results  Component Value Date   INR 0.96 02/19/2015   INR 1.02 10/20/2013   INR 0.92 11/30/2012   No results found for: PTT  Agree with above. So far the toe amputation site looks fine. Dr. Trula Slade can follow this as an outpatient after he is discharged. He is learning how to use his Darco shoe with PTx.  Deitra Mayo, MD, [place] [phone] Office: [phone]

## 2015-02-24 NOTE — Progress Notes (Signed)
Patient is a 64 yo who has a history of PAD, DM, HTN, HL and nonobstructive CAD by cath in Jan 2015 (LAD 30 to 40%; LCx without signif dz; RCA with 50% distal stensosi; LVEF 45%). Patient presented to ER with CP --> mild Troponin elevation --> cath with moderate-severe 1 V disease, but severely reduced LVEF, PV Angio with occluded LLE arterial flow --> seen by Vasc Sgx --> has had toe amputation    Subjective:    Objective:  Vital Signs in the last 24 hours: Temp:  [97.9 F (36.6 C)-98.7 F (37.1 C)] 98.7 F (37.1 C) (05/22 0735) Pulse Rate:  [88-100] 96 (05/22 0735) Resp:  [16] 16 (05/21 1500) BP: (106-130)/(60-90) 130/84 mmHg (05/22 0735) SpO2:  [94 %-99 %] 94 % (05/22 0735)  Intake/Output from previous day: 05/21 0701 - 05/22 0700 In: 480 [P.O.:360] Out: 700 [Urine:700] Intake/Output from this shift: Total I/O In: 240 [P.O.:240] Out: 300 [Urine:300]  Physical Exam: Physical Exam: Blood pressure 130/84, pulse 96, temperature 98.7 F (37.1 C), temperature source Oral, resp. rate 16, height 5\' 9"  (1.753 m), weight 223 lb 11.2 oz (101.47 kg), SpO2 94 %. General: Well developed, well nourished, in no acute distress.  Head: Normal  Neck: Supple.   Lungs: CTA.  Heart: RRR  Abdomen: Soft, non-tender, non-distended   Extremities: s/p amputation of left great toe.   Neuro: grossly intact   Lab Results:  Recent Labs  02/22/15 0306 02/23/15 0554  WBC 6.9 7.2  HGB 11.7* 12.3*  PLT 340 369    Recent Labs  02/22/15 0306 02/23/15 0554  NA 139 139  K 4.0 4.2  CL 109 104  CO2 23 28  GLUCOSE 170* 169*  BUN 8 7  CREATININE 0.99 0.84    Recent Labs  02/21/15 1335  TROPONINI 0.50*    CV Studies: 5/19  PV Angiographic Data:   1: Abdominal aortogram-the distal dominant aorta iliac bifurcation were free of significant atherosclerotic changes  2: Left lower extremity-the left profunda femoris was occluded with what appeared to be thrombus. The SFA was  free of significant disease. The left anterior tibial and peroneal were occluded as well. There was slow flow down the posterior tibial down to the level of the ankle. There is no flow to the foot.  IMPRESSION: Mr. Wittmeyer has what appears to be thromboembolic phenomenon to his left lower extremity involving his profunda femoris and all 3 tibial arteries.     CORONARY ANGIOGRAPHIC RESULTS:   1. Left main; normal  2. LAD; 40% proximal 3. Left circumflex; nondominant and normal. There was a moderate sized ramus branch that was normal.  4. Right coronary artery; dominant with a 70% focal calcified lesion just proximal to the takeoff of the PDA. 5. Left ventriculography; RAO left ventriculogram was performed using  25 mL of Visipaque dye at 12 mL/second. The overall LVEF estimated  25-30 % With wall motion abnormalities moderate global hypokinesia with moderate to severe inferior hypokinesia  IMPRESSION: Mr. Lebeau has a nonischemic cardiomyopathy. His EF is 25% range. He does have a moderate distal RCA lesion however I think this is unrelated to his current situation and by cath January 2015 he had a 50% lesion there already. His LV dysfunction is out of proportion to the amount of CAD. I'm concerned that he may have had a thromboembolic phenomenon from his LV involving his left lower extremity resulting in critical limb ischemia. He will be a 2-D echo with Definity contrast to  assess whether or not he has residual clot in his LV. Medical therapy will be recommended.    ECHO: LVEF is 30 to 35% (Decreased from ~45% in 2014) with diffuse hypokinesis,slightly worse in the infeiror wall No definite thrombus identified. The cavity size was normal. Wall thickness wasincreased in a pattern of moderate LVH. Doppler parameters are consistent with abnormal left ventricular relaxation (grade 1diastolic dysfunction).  Assessment/Plan:  1.  Acute on chronic systolic congestive heart failure. The  patient has been on interferon a which can cause acute systolic congestive heart failure. Continue ARB and beta blocker. We'll consider the addition of spironolactone in the future. We'll continue with aggressive medical therapy. If his left ventricle systolic function doesn't improve after 3 months he'll need to be considered for ICD. At this time there is no indication for a LifeVest.  2. Acute thromboembolus: The patient had a thromboembolic event which resulted in an ischemic left great toe. He also had significant peripheral vascular disease. He has reduced LV systolic function. Follow-up echocardiogram with Definity showed no thrombus. Consideration of anticoagulation can be addressed as an outpatient when he sees Dr. Gwenlyn Found.  3. Coronary artery disease: The patient has moderate coronary artery disease. Continue statin, beta blocker and aspirin.   LOS: 2 days   Kirk Ruths 02/24/2015, 9:36 AM

## 2015-02-24 NOTE — Discharge Summary (Signed)
[facility] Progress Note   Eric Price TMH:[id number] DOB: 1950-11-09 DOA: 11/27/2014 PCP: [name], MD  Brief narrative: This is a 64 year old male with past medical history of PAD (recently diagnosed with left common iliac stenosis as well as occluded posterior and anterior tibial arteries in the left leg), Type 2 DM, HTN, HLD, mycosis fungoides and nonobstructing CAD who presents with a complaint of chest pain.The patient also has an ulcer of the left first toe which occurred after an ingrown toenail was removed. He has been on antibiotics as outpatient for this- he was referred to Dr. Gwenlyn Found and underwent arterial Doppler studies on 5/10 which revealed mild stenosis of the left common iliac artery and occluded posterior and anterior tibial arteries. Dr. Gwenlyn Found had planned to do an angiogram as outpatient on 5/19.   Subjective: Evaluated this morning-patient had some complaints of moderate pain in his left foot which is controllable with oral and IV pain medication. No complaints of shortness of breath cough nausea vomiting diarrhea. He was able to ambulate yesterday with physical therapy. Have discussed echo findings and plan with him in detail.  Assessment/Plan: Chest pain -Underwent cardiac cath 5/19 which revealed a distal RCA stenosis of 75% and a mid LAD stenosis of 40% -wall motion abnormalities with moderate global hypokinesia and moderate to severe inferior hypokinesia noted  Active Problems:  Critical limb ischemia/nonhealingfirst toe ischemic ulcer -Underwent angiogram along with cath on 5/19 - suspected to have developed a thrombus in the LV in relation to his poor EF which embolized to profunda femoris in all 3 tibial arteries- -Underwent right first toe amputation on 5/20 -- surgery has left the following instructions:PWB left foot, heel only with Darko shoe.- PT has evaluated him as well-No home PT recommended-he will need a rolling walker with 5 inch  wheels -Discontinued vancomycin and cefepime on 5/21 which were being given for suspected infection in the toe  Cardiomyopathy-nonischemic -EF noted to be 92%-JJHER uncertain -possibly related to interferon alpha which she is receiving for mycosis fungoides -will need to discontinue for now and return to his oncologist for further evaluation for an alternative medicine - cardiology assisting with management-continue ARB-   -Echo ordered with Definity to further evaluate for residual LV clot- this does not show any thrombus -Question of lung term anticoagulation still needs to be addressed by cardiology   Type 2 diabetes mellitus - cont Lantus and NovoLog -Continue to hold metformin and glyburide   Hypertension - Continue carvedilol and losartan   Hyperlipidemia -Taking Lipitor and Zetia  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 and interferon alpha by physicians at [facility] -Recommended to hold interferon alpha as it may resulted in the cardiomyopathy-he will go back and discuss this with his physician   Code Status: Full code Family Communication: sisters at bedside Disposition Plan: Home when okay with vascular surgery DVT prophylaxis: Lovenox Consultants: Cardiology, vascular surgery Procedures: 5/19 Cardiac catheterization, abdominal aortogram and left lower extremity angiogram 5/20 left first toe amputation 5/19 2-D echo  Antibiotics: Anti-infectives    Start     Dose/Rate Route Frequency Ordered Stop   02/22/15 1200  ceFAZolin (ANCEF) IVPB 1 g/50 mL premix  Status:  Discontinued    Comments:  Send with pt to OR   1 g 100 mL/hr over 30 Minutes Intravenous On call 02/21/15 2041 02/22/15 0931   02/22/15 1200  ceFAZolin (ANCEF) IVPB 2 g/50 mL premix    Comments:  Send with pt to OR   2 g 100  mL/hr over 30 Minutes Intravenous On call 02/22/15 0931 02/22/15 1430   02/21/15 1800  vancomycin (VANCOCIN) IVPB 1000 mg/200 mL premix  Status:  Discontinued     1,000  mg 200 mL/hr over 60 Minutes Intravenous Every 12 hours 02/21/15 0453 02/23/15 1115   02/21/15 0600  ceFEPIme (MAXIPIME) 2 g in dextrose 5 % 50 mL IVPB  Status:  Discontinued     2 g 100 mL/hr over 30 Minutes Intravenous 3 times per day 02/21/15 0453 02/23/15 1115   02/21/15 0530  vancomycin (VANCOCIN) 1,500 mg in sodium chloride 0.9 % 500 mL IVPB     1,500 mg 250 mL/hr over 120 Minutes Intravenous NOW 02/21/15 0453 02/21/15 0914      Objective: Filed Weights   02/20/15 1934 02/21/15 0100  Weight: 102.059 kg (225 lb) 101.47 kg (223 lb 11.2 oz)    Intake/Output Summary (Last 24 hours) at 02/24/15 1021 Last data filed at 02/24/15 0829  Gross per 24 hour  Intake    720 ml  Output   1000 ml  Net   -280 ml     Vitals Filed Vitals:   02/23/15 2015 02/23/15 2331 02/24/15 0500 02/24/15 0735  BP: 128/90 129/81 106/68 130/84  Pulse: 88 96 100 96  Temp: 97.9 F (36.6 C)  98.1 F (36.7 C) 98.7 F (37.1 C)  TempSrc: Oral  Oral Oral  Resp:      Height:      Weight:      SpO2: 96% 96% 99% 94%    Exam:  General:  Pt is alert, not in acute distress  HEENT: No icterus, No thrush  Cardiovascular: regular rate and rhythm, S1/S2 No murmur  Respiratory: clear to auscultation bilaterally   Abdomen: Soft, +Bowel sounds, non tender, non distended, no guarding  MSK: No cyanosis clubbing or edema-dressing on left foot not opened  Data Reviewed: Basic Metabolic Panel:  Recent Labs Lab 02/19/15 1535 02/20/15 1950 02/21/15 0443 02/22/15 0306 02/23/15 0554  NA 136 137 141 139 139  K 4.6 5.0 4.0 4.0 4.2  CL 101 103 108 109 104  CO2 24 25 24 23 28   GLUCOSE 239* 198* 117* 170* 169*  BUN 8 8 8 8 7   CREATININE 0.99 1.01 0.93 0.99 0.84  CALCIUM 9.1 9.4 9.3 9.0 9.2   Liver Function Tests: No results for input(s): AST, ALT, ALKPHOS, BILITOT, PROT, ALBUMIN in the last 168 hours. No results for input(s): LIPASE, AMYLASE in the last 168 hours. No results for input(s): AMMONIA in  the last 168 hours. CBC:  Recent Labs Lab 02/19/15 1535 02/20/15 1950 02/22/15 0306 02/23/15 0554  WBC 6.5 7.6 6.9 7.2  HGB 12.9* 12.4* 11.7* 12.3*  HCT 38.5* 37.3* 35.3* 37.0*  MCV 81.4 82.5 83.3 82.6  PLT 369 351 340 369   Cardiac Enzymes:  Recent Labs Lab 02/21/15 0443 02/21/15 1335  CKTOTAL 177 145  CKMB 7.0* 6.0*  TROPONINI 0.37* 0.50*   BNP (last 3 results)  Recent Labs  02/21/15 0443  BNP 95.7    ProBNP (last 3 results) No results for input(s): PROBNP in the last 8760 hours.  CBG:  Recent Labs Lab 02/23/15 1148 02/23/15 1704 02/23/15 2112 02/24/15 0011 02/24/15 0743  GLUCAP 194* 186* 208* 224* 149*    Recent Results (from the past 240 hour(s))  Surgical pcr screen     Status: None   Collection Time: 02/22/15 11:42 AM  Result Value Ref Range Status   MRSA, PCR NEGATIVE NEGATIVE  Final   Staphylococcus aureus NEGATIVE NEGATIVE Final    Comment:        The Xpert SA Assay (FDA approved for NASAL specimens in patients over 59 years of age), is one component of a comprehensive surveillance program.  Test performance has been validated by [facility] for patients greater than or equal to 26 year old. It is not intended to diagnose infection nor to guide or monitor treatment.      Studies: No results found.  Scheduled Meds:  Scheduled Meds: . acitretin  25 mg Oral Q M,W,F  . aspirin  81 mg Oral Daily  . atorvastatin  80 mg Oral q1800  . buPROPion  150 mg Oral Daily  . carvedilol  6.25 mg Oral BID WC  . enoxaparin (LOVENOX) injection  40 mg Subcutaneous Daily  . ezetimibe  10 mg Oral Daily  . feeding supplement (PRO-STAT SUGAR FREE 64)  30 mL Oral BID  . gabapentin  300 mg Oral TID  . insulin aspart  5 Units Subcutaneous TID WC  . insulin detemir  25 Units Subcutaneous Q2200  . losartan  50 mg Oral Daily  . sertraline  100 mg Oral Daily  . tamsulosin  0.4 mg Oral Daily   Continuous Infusions:    Time spent on care of this  patient: 35 minutes   [place], MD 02/24/2015, 10:21 AM  LOS: 2 days   [facility] Office  [phone] Pager - Text Page per [email or web address]  If 7PM-7AM, please contact night-coverage [email or web address]

## 2015-02-24 NOTE — Progress Notes (Signed)
Physical Therapy Treatment Patient Details Name: Eric Price MRN: [id number] DOB: [birth year] Today's Date: 02/24/2015    History of Present Illness Patient is a 64 yo male admitted 02/20/15 with chest pain and Lt foot/leg pain.  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  PMH:  DM, PAD, HTN, HLD, CAD, Mycosis fungoides    PT Comments    Pt was seen for evaluation of his functional status with limited tolerance of endurance to use LLE shoe and maintain a safe progression of gait.  He is having some low endurance for gait but is interested in trying to do more.  PT limited finally due to his weakness and need to control LLE in Darco shoe.  Pt tends to roll off the L ankle and is encouraged to limit gait due to his joint stability.  Follow Up Recommendations  Home health PT;Supervision/Assistance - 24 hour     Equipment Recommendations  Rolling walker with 5" wheels    Recommendations for Other Services       Precautions / Restrictions Precautions Precautions:  (telemetry) Required Braces or Orthoses: Other Brace/Splint Other Brace/Splint: Darco shoe Lt foot Restrictions Weight Bearing Restrictions: Yes LLE Weight Bearing: Partial weight bearing    Mobility  Bed Mobility Overal bed mobility: Needs Assistance Bed Mobility: Supine to Sit;Sit to Supine     Supine to sit: Supervision Sit to supine: Supervision   General bed mobility comments: Supervision for safety.  Patient with good balance with sitting.  Transfers Overall transfer level: Needs assistance Equipment used: Rolling walker (2 wheeled) Transfers: Sit to/from [facility] Sit to Stand: Min guard;Min assist Stand pivot transfers: Min guard;Min assist       General transfer comment: cues for safety and sequence, esp to mainttain PWB on LLE  Ambulation/Gait Ambulation/Gait assistance: Min guard;Min assist Ambulation Distance (Feet): 200 Feet Assistive device: Rolling walker (2 wheeled) Gait  Pattern/deviations: Step-through pattern;Step-to pattern;Decreased stride length;Drifts right/left;Trunk flexed;Wide base of support;Decreased weight shift to left Gait velocity: Decreased Gait velocity interpretation: Below normal speed for age/gender General Gait Details: verbally cued sequence and wbing   Stairs            Wheelchair Mobility    Modified Rankin (Stroke Patients Only)       Balance Overall balance assessment: Needs assistance Sitting-balance support: Feet supported Sitting balance-Leahy Scale: Fair   Postural control: Posterior lean Standing balance support: Bilateral upper extremity supported Standing balance-Leahy Scale: Poor                      Cognition Arousal/Alertness: Awake/alert Behavior During Therapy: WFL for tasks assessed/performed Overall Cognitive Status: Impaired/Different from baseline Area of Impairment: Memory;Attention;Safety/judgement;Awareness;Problem solving;Following commands   Current Attention Level: Alternating Memory: Decreased short-term memory;Decreased recall of precautions Following Commands: Follows one step commands inconsistently Safety/Judgement: Decreased awareness of safety;Decreased awareness of deficits Awareness: Intellectual Problem Solving: Slow processing;Decreased initiation;Difficulty sequencing;Requires verbal cues      Exercises Other Exercises Other Exercises: Strength on RLE was 4+ to 55, LLE was 4- to 4+    General Comments General comments (skin integrity, edema, etc.): Pt is with his family with possibly too much distraction and limited focus on the task      Pertinent Vitals/Pain Pain Assessment: 0-10 Pain Score: 2  Pain Location: L foot with cold feeling as well Pain Intervention(s): Limited activity within patient's tolerance;Monitored during session;Premedicated before session;Repositioned    Home Living  Prior Function            PT Goals  (current goals can now be found in the care plan section) Acute Rehab PT Goals Patient Stated Goal: To return home soon Progress towards PT goals: Progressing toward goals    Frequency  Min 3X/week    PT Plan Discharge plan needs to be updated    Co-evaluation             End of Session Equipment Utilized During Treatment: Gait belt;Other (comment) (Darco shoe) Activity Tolerance: Patient limited by fatigue;Patient limited by lethargy Patient left: in bed;with call bell/phone within reach;with family/visitor present     Time: 3888-2800 PT Time Calculation (min) (ACUTE ONLY): 33 min  Charges:  $Gait Training: 8-22 mins $Therapeutic Exercise: 8-22 mins                    G Codes:      Ramond Dial March 13, 2015, 3:31 PM   Mee Hives, PT MS Acute Rehab Dept. Number: [phone] [phone] and [place] [phone]

## 2015-02-25 ENCOUNTER — Telehealth: Payer: Self-pay | Admitting: Surgery

## 2015-02-25 LAB — GLUCOSE, CAPILLARY
GLUCOSE-CAPILLARY: 157 mg/dL — AB (ref 65–99)
GLUCOSE-CAPILLARY: 225 mg/dL — AB (ref 65–99)
GLUCOSE-CAPILLARY: 216 mg/dL — AB (ref 65–99)

## 2015-02-25 MED ORDER — OXYCODONE-ACETAMINOPHEN 5-325 MG PO TABS: 1.0000 | ORAL_TABLET | Freq: Four times a day (QID) | ORAL | Status: DC | PRN

## 2015-02-25 MED ORDER — APIXABAN 5 MG PO TABS: 5.0000 mg | ORAL_TABLET | Freq: Two times a day (BID) | ORAL | Status: DC

## 2015-02-25 MED ORDER — APIXABAN 5 MG PO TABS
5.0000 mg | ORAL_TABLET | Freq: Two times a day (BID) | ORAL | Status: DC
  Administered 2015-02-25: 5 mg via ORAL
  Filled 2015-02-25 (×2): qty 1

## 2015-02-25 MED ORDER — PRO-STAT SUGAR FREE PO LIQD: 30.0000 mL | Freq: Two times a day (BID) | ORAL | Status: DC

## 2015-02-25 NOTE — Discharge Instructions (Signed)
Information on my medicine - ELIQUIS (apixaban)  This medication education was reviewed with me or my healthcare representative as part of my discharge preparation.  The pharmacist that spoke with me during my hospital stay was:  Lavenia Atlas, [facility]  Why was Eliquis prescribed for you? Eliquis was prescribed for you to reduce the risk of forming blood clots that can cause a stroke if you have a medical condition called atrial fibrillation (a type of irregular heartbeat) OR to reduce the risk of a blood clots forming after orthopedic surgery.  What do You need to know about Eliquis ? Take your Eliquis TWICE DAILY - one 5 mg tablet in the morning and one 5 mg tablet in the evening with or without food.  It would be best to take the doses about the same time each day.  If you have difficulty swallowing the tablet whole please discuss with your pharmacist how to take the medication safely.  Take Eliquis exactly as prescribed by your doctor and DO NOT stop taking Eliquis without talking to the doctor who prescribed the medication.  Stopping may increase your risk of developing a new clot or stroke.  Refill your prescription before you run out.  After discharge, you should have regular check-up appointments with your healthcare provider that is prescribing your Eliquis.  In the future your dose may need to be changed if your kidney function or weight changes by a significant amount or as you get older.  What do you do if you miss a dose? If you miss a dose, take it as soon as you remember on the same day and resume taking twice daily.  Do not take more than one dose of ELIQUIS at the same time.  Important Safety Information A possible side effect of Eliquis is bleeding. You should call your healthcare provider right away if you experience any of the following: ? Bleeding from an injury or your nose that does not stop. ? Unusual colored urine (red or dark brown) or unusual colored  stools (red or black). ? Unusual bruising for unknown reasons. ? A serious fall or if you hit your head (even if there is no bleeding).  Some medicines may interact with Eliquis and might increase your risk of bleeding or clotting while on Eliquis. To help avoid this, consult your healthcare provider or pharmacist prior to using any new prescription or non-prescription medications, including herbals, vitamins, non-steroidal anti-inflammatory drugs (NSAIDs) and supplements.  This website has more information on Eliquis (apixaban): [email or web address].

## 2015-02-25 NOTE — Clinical Social Work Note (Signed)
CSW received call from RN requesting EMS transportation for patient at time of discharge. Per RN, patient will be discharging after hours. CSW has completed EMS paperwork and placed on patient's chart.  RN to call for EMS once patient stable for discharge:  Before 5pm: [phone] After 5pm: [phone] (Option 1 then Option 3)  Lubertha Sayres, [place] [phone]) and Surgical [phone])

## 2015-02-25 NOTE — Care Management Note (Signed)
Case Management Note  Patient Details  Name: Alric Geise MRN: [id number] Date of Birth: 03-16-1951  Subjective/Objective: Pt admitted for cp and Critical lower limb ischemia.                  Action/Plan: Plan for d/c home with [facility] services. Referral given to [facility] and [place] to begin within 24-48 hrs post d/c. No further needs from CM at this time.    Expected Discharge Date:                  Expected Discharge Plan:  [place]  In-House Referral:     Discharge planning Services  CM Consult  Post Acute Care Choice:  Durable Medical Equipment Choice offered to:  Patient, Spouse  DME Arranged:  Walker rolling DME Agency:  [place] Arranged:  RN, PT [facility] Agency:  [place]  Status of Service:     Medicare Important Message Given:  No Date Medicare IM Given:    Medicare IM give by:    Date Additional Medicare IM Given:    Additional Medicare Important Message give by:     If discussed at [place] of Stay Meetings, dates discussed:    Additional Comments:  Bethena Roys, RN 02/25/2015, 2:44 PM

## 2015-02-25 NOTE — Progress Notes (Addendum)
    Dry dressing changes daily elevate your left foot when at rest heel weight bearing in darco shoe.  F/U in our office with Dr. Trula Slade in 2 weeks.  COLLINS, EMMA MAUREEN PA-C    I changed the patient's dressing today.  His amputation site is looking adequate.  There was a small amount of drainage, however the skin edges looked healthy.  I'm okay with discharge to home.  I would like for him to be seen in the office this week before the long holiday weekend just to make sure that we're continuing along the correct path.  I am scheduling him to see [place] in the office on Friday for a wound check.  If he stays in the hospital longer, this may not be necessary.  Annamarie Major

## 2015-02-25 NOTE — Progress Notes (Signed)
Patient Name: Eric Price Date of Encounter: 02/25/2015  Primary Cardiologist: Dr. Gwenlyn Found   Principal Problem:   Chest pain Active Problems:   Essential hypertension   Hyperlipidemia   Diabetes mellitus type 2, uncontrolled   Critical lower limb ischemia   Diabetic foot ulcer   Non-ischemic cardiomyopathy: EF ~30-25%   Demand ischemia of myocardium   Coronary artery disease, non-occlusive: mod LAD, ~62-69& dRCA   Embolic disease of toe   Ischemic ulcer of toe of left foot    SUBJECTIVE  Denies any CP or SOB. L toe continue to hurt.   CURRENT MEDS . acitretin  25 mg Oral Q M,W,F  . aspirin  81 mg Oral Daily  . atorvastatin  80 mg Oral q1800  . buPROPion  150 mg Oral Daily  . carvedilol  6.25 mg Oral BID WC  . enoxaparin (LOVENOX) injection  40 mg Subcutaneous Daily  . ezetimibe  10 mg Oral Daily  . feeding supplement (PRO-STAT SUGAR FREE 64)  30 mL Oral BID  . gabapentin  300 mg Oral TID  . insulin aspart  5 Units Subcutaneous TID WC  . insulin detemir  25 Units Subcutaneous Q2200  . losartan  50 mg Oral Daily  . sertraline  100 mg Oral Daily  . tamsulosin  0.4 mg Oral Daily    OBJECTIVE  Filed Vitals:   02/24/15 1350 02/24/15 1758 02/24/15 2136 02/25/15 0442  BP: 123/77 145/125 132/81 133/92  Pulse: 91 90 87 95  Temp: 98.4 F (36.9 C)  97.8 F (36.6 C) 98.5 F (36.9 C)  TempSrc: Oral  Oral Oral  Resp: 18  18   Height:      Weight:    221 lb 1.6 oz (100.29 kg)  SpO2: 96%  98% 95%    Intake/Output Summary (Last 24 hours) at 02/25/15 0950 Last data filed at 02/25/15 0800  Gross per 24 hour  Intake   1200 ml  Output   1400 ml  Net   -200 ml   Filed Weights   02/20/15 1934 02/21/15 0100 02/25/15 0442  Weight: 225 lb (102.059 kg) 223 lb 11.2 oz (101.47 kg) 221 lb 1.6 oz (100.29 kg)    PHYSICAL EXAM  General: Pleasant, NAD. Neuro: Alert and oriented X 3. Moves all extremities spontaneously. Psych: Normal affect. HEENT:  Normal  Neck: Supple  without bruits or JVD. Lungs:  Resp regular and unlabored, CTA. Heart: RRR no s3, s4, or murmurs. Abdomen: Soft, non-tender, non-distended, BS + x 4.  Extremities: No clubbing, cyanosis or edema. DP/PT/Radials 2+ and equal bilaterally.  Accessory Clinical Findings  CBC  Recent Labs  02/23/15 0554  WBC 7.2  HGB 12.3*  HCT 37.0*  MCV 82.6  PLT 485   Basic Metabolic Panel  Recent Labs  02/23/15 0554  NA 139  K 4.2  CL 104  CO2 28  GLUCOSE 169*  BUN 7  CREATININE 0.84  CALCIUM 9.2    TELE NSR     ECG  No new EKG  Echocardiogram  - Left ventricle: LVEF is 30 to 35% with diffuse hypokinesis, slightly worse in the infeiror wall No definite thrombus identified. The cavity size was normal. Wall thickness was increased in a pattern of moderate LVH. Doppler parameters are consistent with abnormal left ventricular relaxation (grade 1 diastolic dysfunction).     Radiology/Studies  Dg Chest Port 1 View  2020/11/2614   CLINICAL DATA:  Shortness of breath and tachycardia beginning today, blood clots in  LEFT leg for which patient was scheduled to have surgery tomorrow, at diabetes, hypertension, on chemotherapy for skin cancer  EXAM: PORTABLE CHEST - 1 VIEW  COMPARISON:  Portable exam 2035 hours compared to 10/19/2013  FINDINGS: Upper normal heart size likely accentuated by technique.  Tortuous aorta.  Pulmonary vascularity normal.  Lungs clear.  No pleural effusion or pneumothorax.  Scattered endplate spur formation thoracic spine.  IMPRESSION: No acute abnormalities.   Electronically Signed   By: Lavonia Dana M.D.   On: 09-20-2015 20:55   Ap / Lateral X-ray Left Foot  02/21/2015   CLINICAL DATA:  Pain and swelling left great toe. Diabetic patient with history of ingrown toenail removal.  EXAM: LEFT FOOT - 2 VIEW  COMPARISON:  None.  FINDINGS: No osseous destructive change to suggest osteomyelitis. No erosion or periosteal reaction. 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 Hallux valgus with medial soft tissue prominence at the first metatarsal phalangeal joint suggesting bunion. No fracture or dislocation. No radiopaque foreign body. Plantar calcaneal spur is noted.  IMPRESSION: 1. No acute osseous abnormality radiographic findings of osteomyelitis. 2. Hallux valgus with osteoarthritis of the first metatarsal phalangeal joint medial soft tissue prominence, may reflect bunion.   Electronically Signed   By: Jeb Levering M.D.   On: 02/21/2015 05:02    ASSESSMENT AND PLAN  Patient is a 64 yo who has a history of PAD, DM, HTN, HL and nonobstructive CAD by cath in Jan 2015 (LAD 30 to 40%; LCx without signif dz; RCA with 50% distal stensosi; LVEF 45%). Patient presented to ER with CP --> mild Troponin elevation --> cath with moderate-severe 1 V disease, but severely reduced LVEF, PV Angio with occluded LLE arterial flow --> seen by Vasc Sgx --> has had toe amputation  1. Acute on chronic systolic HF  - moderate LV dysfunction noted on echo, cath 1v coronary dx. Also has thromboembolic event resulting in amputation of L toe  - need reeval in 3 month, if EF continue to be low, will need ICD  - continue coreg and ARB. Will send staff message to arrange outpatient followup with Dr. Gwenlyn Found, no further cardiac recommendation I think that he needs chronic anticoagulation  Start Eliquis 5 mbid   2. Acute thromboembolus   - LE angiography 02/21/2015 occluded L profunda femoris with thrombus, occluded anterior tibial and peroneal  3. CAD  - nonobstructive CAD 10/2013 LAD 30 to 40%; LCx without signif dz; RCA with 50% distal stensosi; LVEF 45%  - LHC 02/21/2015 75% distal RCA, 40% mid LAD  4. PAD 5. HTN 6. DM 7. HLD  Signed, Woodward Ku Pager: [phone]   Attending Note:   The patient was seen and examined.  Agree with assessment and plan as noted above.  Changes made to the above note as needed.  I think that patient  needs long term anticoagulation . Will start Eliquis 5 BID.   Follow up with Dr. Gwenlyn Found.   Thayer Headings, Brooke Bonito., MD, [facility] 02/25/2015, 11:05 AM [address],  [place] Pager [phone]

## 2015-02-25 NOTE — Discharge Summary (Addendum)
Physician Discharge Summary  Eric Price WUJ:[id number] DOB: [birth year] DOA: 01-15-2015  PCP: [name], MD  Admit date: 01-15-2015 Discharge date: 02/25/2015  Time spent: 50 minutes  Recommendations for Outpatient Follow-up:  1. 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 He was admitted for further workup.  The patient also had an nonhealing ulcer of the left first toe which occurred after an ingrown toenail was removed. He has been on antibiotics as outpatient for this. He was referred to Dr. Gwenlyn Found for symptoms of claudication and underwent arterial Doppler studies on 5/10 which revealed mild stenosis of the left common iliac artery and occluded posterior and anterior tibial arteries. Dr. Gwenlyn Found had planned to do an angiogram as outpatient on 5/19.  Hospital Course:  Chest pain -Likely noncardiac-resolved and has not recurred throughout the hospital stay -Underwent cardiac cath 5/19 which revealed a distal RCA stenosis of 75% and a mid LAD stenosis of 40%, segmental wall motion abnormalities in the LV and  global hypokinesia of the LV noted. EF was determined to be 25-35%.  Active Problems:  Critical limb ischemia/nonhealingfirst toe ischemic ulcer -Underwent angiogram along with cath on 5/19 - suspected to have developed a thrombus in the LV in relation to his poor EF which embolized to profunda femoris in all 3 tibial arteries- -Underwent right first toe amputation on 5/20 -- surgery has left the following instructions:PWB left foot, heel only with Darko shoe.- PT has evaluated him as well and recommended HHPT-he will need a rolling walker with 5 inch wheels -Discontinued vancomycin and cefepime on 5/21 which were being given for suspected infection in the toe  Cardiomyopathy-nonischemic-chronic systolic heart failure -EF noted to be 25%- -possibly related to interferon alpha which he is receiving for mycosis fungoides -will need to discontinue for now and return to his oncologist for further evaluation for an alternative medicine - cardiology assisting with management -continue losartan -Echo ordered with Definity to further evaluate for residual LV clot- this does not show any thrombus -Cardiology has started Eliquis at a dose of 5 mg twice a day for further prevention of thrombus   Type 2 diabetes mellitus - Lantus NovoLog metformin and glyburide continued on discharge today   Hypertension - Continue carvedilol and losartan   Hyperlipidemia -Taking Lipitor and Zetia  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 and interferon alpha by physicians at [facility] -Recommended to hold interferon alpha as it may resulted in the cardiomyopathy-he will go back and discuss this with his physician    Consultants: Cardiology, vascular surgery  Procedures: 5/19 Cardiac catheterization, abdominal aortogram and left lower extremity angiogram  5/20 left first toe amputation  5/19  2-D echo Left ventricle: LVEF is 30 to 35% with diffuse hypokinesis, slightly worse in the infeiror  wall No definite thrombus identified. The cavity size was normal. Wall thickness was increased in a pattern of moderate LVH. Doppler parameters are consistent with abnormal left ventricular relaxation (grade 1 diastolic dysfunction).  02/23/15- 2DECHO with definity  Left ventricle: Systolic function was moderately reduced. The estimated ejection fraction was in the range of 35% to 40%. No evidence of thrombus.   Discharge Exam: Filed Weights   02/20/15 1934 02/21/15 0100 02/25/15 0442  Weight: 102.059 kg (225 lb) 101.47 kg (223 lb 11.2 oz) 100.29 kg (221 lb 1.6 oz)   Filed Vitals:   02/25/15 0442  BP: 133/92  Pulse: 95  Temp: 98.5 F (36.9 C)  Resp:     General: AAO x 3, no distress Cardiovascular: RRR, no murmurs  Respiratory: clear to auscultation bilaterally GI: soft, non-tender, non-distended, bowel sound positive  Musculoskeletal: Dressing on left foot has been changed by surgery-I have not opened it-no cyanosis clubbing or edema of right foot or left leg above the ankle  Discharge Instructions You were cared for by a hospitalist during your hospital stay. If you have any questions about your discharge medications or the care you received while you were in the hospital after you are discharged, you can call the unit and asked to speak with the hospitalist on call if the hospitalist that took care of you is not available. Once you are discharged, your primary care physician will handle any further medical issues. Please note that NO REFILLS for any discharge medications will be authorized once you are discharged, as it is imperative that you return to your primary care physician (or establish a relationship with a primary care physician if you do not have one) for your aftercare needs so that they can reassess your need for medications and monitor your lab values.      Discharge Instructions    Discharge instructions    Complete by:  As directed   Low sodium,  heart healthy, diabetic diet     Increase activity slowly    Complete by:  As directed             Medication List    STOP taking these medications        aspirin 81 MG tablet     doxycycline 100 MG capsule  Commonly known as:  VIBRAMYCIN     ibuprofen 800 MG tablet  Commonly known as:  ADVIL,MOTRIN     interferon alfa-2b 6000000 UNIT/ML injection  Commonly known as:  INTRON-A     oxyCODONE-acetaminophen 10-325 MG per tablet  Commonly known as:  PERCOCET  Replaced by:  oxyCODONE-acetaminophen 5-325 MG per tablet     traMADol 50 MG tablet  Commonly known as:  ULTRAM      TAKE these medications        acitretin 25 MG capsule  Commonly known as:  SORIATANE  Take one by mouth on Monday, Wednesday and Friday.     apixaban 5 MG Tabs tablet  Commonly known as:  ELIQUIS  Take 1 tablet (5 mg total) by mouth 2 (two) times daily.     atorvastatin 40 MG tablet  Commonly known as:  LIPITOR  Take 40 mg by mouth 2 (two) times daily.     buPROPion 150 MG 24 hr tablet  Commonly known as:  WELLBUTRIN XL  TAKE 1 TABLET BY MOUTH DAILY     carvedilol  3.125 MG tablet  Commonly known as:  COREG  Take 1 tablet (3.125 mg total) by mouth 2 (two) times daily with a meal.     clobetasol cream 0.05 %  Commonly known as:  TEMOVATE  APPLY 1 APPLICATION TOPICALLY TWICE DAILY     cyclobenzaprine 10 MG tablet  Commonly known as:  FLEXERIL  Take 1 tablet (10 mg total) by mouth 3 (three) times daily as needed for muscle spasms.     feeding supplement (PRO-STAT SUGAR FREE 64) Liqd  Take 30 mLs by mouth 2 (two) times daily.     fenofibrate 54 MG tablet  Take 54 mg by mouth daily.     gabapentin 300 MG capsule  Commonly known as:  NEURONTIN  Take 300 mg by mouth 2 (two) times daily.     glucose blood test strip  Commonly known as:  ONE TOUCH TEST STRIPS  CHECK 2 TIMES DAILY. ONE TOUCH ULTRA TEST STRIPS. DX:250.00     glyBURIDE 5 MG tablet  Commonly known as:  DIABETA  TAKE 1  TABLET BY MOUTH TWICE DAILY WITH A MEAL     hydrOXYzine 10 MG tablet  Commonly known as:  ATARAX/VISTARIL  Take 10 mg by mouth 3 (three) times daily as needed for itching.     Insulin Detemir 100 UNIT/ML Pen  Commonly known as:  LEVEMIR FLEXTOUCH  30 units at bedtime     insulin lispro 100 UNIT/ML KiwkPen  Commonly known as:  HUMALOG  Inject 0.05 mLs (5 Units total) into the skin 3 (three) times daily.     losartan 50 MG tablet  Commonly known as:  COZAAR  Take 1 tablet (50 mg total) by mouth daily.     metFORMIN 500 MG tablet  Commonly known as:  GLUCOPHAGE  TAKE 2 TABLETS BY MOUTH TWICE DAILY WITH MEALS     nitroGLYCERIN 0.4 MG SL tablet  Commonly known as:  NITROSTAT  Place 1 tablet (0.4 mg total) under the tongue every 5 (five) minutes as needed for chest pain.     ONE TOUCH LANCETS Misc  Check 2 times daily.     oxyCODONE-acetaminophen 5-325 MG per tablet  Commonly known as:  PERCOCET/ROXICET  Take 1-2 tablets by mouth every 6 (six) hours as needed for moderate pain.     sertraline 100 MG tablet  Commonly known as:  ZOLOFT  TAKE 1 TABLET BY MOUTH EVERY DAY     tamsulosin 0.4 MG Caps capsule  Commonly known as:  FLOMAX  TAKE 1 CAPSULE BY MOUTH DAILY     triamcinolone cream 0.1 %  Commonly known as:  KENALOG  Apply 1 application topically daily as needed (affected areas).       Allergies  Allergen Reactions  . Morphine Shortness Of Breath and Anaphylaxis  . Morphine And Related     "took my breath away"   Follow-up Information    Follow up with Annamarie Major, MD In 2 weeks.   Specialties:  Vascular Surgery, Cardiology   Why:  sent message to office   Contact information:   [place] [address] [phone]       Follow up with [name], MD. Go on 03/05/2015.   Specialty:  Family Medicine   Why:  at 11:45 A.M.   Contact information:   [place] [id number] [phone]        The results of  significant diagnostics from this hospitalization (including imaging, microbiology, ancillary  and laboratory) are listed below for reference.    Significant Diagnostic Studies: Dg Chest Port 1 View  08/04/202016   CLINICAL DATA:  Shortness of breath and tachycardia beginning today, blood clots in LEFT leg for which patient was scheduled to have surgery tomorrow, at diabetes, hypertension, on chemotherapy for skin cancer  EXAM: PORTABLE CHEST - 1 VIEW  COMPARISON:  Portable exam 2035 hours compared to 10/19/2013  FINDINGS: Upper normal heart size likely accentuated by technique.  Tortuous aorta.  Pulmonary vascularity normal.  Lungs clear.  No pleural effusion or pneumothorax.  Scattered endplate spur formation thoracic spine.  IMPRESSION: No acute abnormalities.   Electronically Signed   By: Lavonia Dana M.D.   On: 008/04/202016 20:55   Ap / Lateral X-ray Left Foot  02/21/2015   CLINICAL DATA:  Pain and swelling left great toe. Diabetic patient with history of ingrown toenail removal.  EXAM: LEFT FOOT - 2 VIEW  COMPARISON:  None.  FINDINGS: No osseous destructive change to suggest osteomyelitis. No erosion or periosteal reaction. 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 Hallux valgus with medial soft tissue prominence at the first metatarsal phalangeal joint suggesting bunion. No fracture or dislocation. No radiopaque foreign body. Plantar calcaneal spur is noted.  IMPRESSION: 1. No acute osseous abnormality radiographic findings of osteomyelitis. 2. Hallux valgus with osteoarthritis of the first metatarsal phalangeal joint medial soft tissue prominence, may reflect bunion.   Electronically Signed   By: Jeb Levering M.D.   On: 02/21/2015 05:02    Microbiology: Recent Results (from the past 240 hour(s))  Surgical pcr screen     Status: None   Collection Time: 02/22/15 11:42 AM  Result Value Ref Range Status   MRSA, PCR NEGATIVE NEGATIVE Final   Staphylococcus aureus  NEGATIVE NEGATIVE Final    Comment:        The Xpert SA Assay (FDA approved for NASAL specimens in patients over 83 years of age), is one component of a comprehensive surveillance program.  Test performance has been validated by [facility] for patients greater than or equal to 24 year old. It is not intended to diagnose infection nor to guide or monitor treatment.      Labs: Basic Metabolic Panel:  Recent Labs Lab 02/19/15 1535 02/20/15 1950 02/21/15 0443 02/22/15 0306 02/23/15 0554  NA 136 137 141 139 139  K 4.6 5.0 4.0 4.0 4.2  CL 101 103 108 109 104  CO2 24 25 24 23 28   GLUCOSE 239* 198* 117* 170* 169*  BUN 8 8 8 8 7   CREATININE 0.99 1.01 0.93 0.99 0.84  CALCIUM 9.1 9.4 9.3 9.0 9.2   Liver Function Tests: No results for input(s): AST, ALT, ALKPHOS, BILITOT, PROT, ALBUMIN in the last 168 hours. No results for input(s): LIPASE, AMYLASE in the last 168 hours. No results for input(s): AMMONIA in the last 168 hours. CBC:  Recent Labs Lab 02/19/15 1535 02/20/15 1950 02/22/15 0306 02/23/15 0554  WBC 6.5 7.6 6.9 7.2  HGB 12.9* 12.4* 11.7* 12.3*  HCT 38.5* 37.3* 35.3* 37.0*  MCV 81.4 82.5 83.3 82.6  PLT 369 351 340 369   Cardiac Enzymes:  Recent Labs Lab 02/21/15 0443 02/21/15 1335  CKTOTAL 177 145  CKMB 7.0* 6.0*  TROPONINI 0.37* 0.50*   BNP: BNP (last 3 results)  Recent Labs  02/21/15 0443  BNP 95.7    ProBNP (last 3 results) No results for input(s): PROBNP in the last 8760 hours.  CBG:  Recent Labs Lab 02/24/15 1149 02/24/15 1616 02/24/15 2129 02/25/15 0713 02/25/15 1107  GLUCAP 181* 174* 147* 157* 225*       SignedDebbe Odea, MD [facility] 02/25/2015, 1:10 PM

## 2015-02-25 NOTE — Telephone Encounter (Addendum)
-----   Message from Denman George, RN sent at 02/22/2015  5:13 PM EDT ----- Regarding: sched appt. with NP 02/28/15/ wound check   ----- Message -----    From: [name], MD    Sent: 02/22/2015   4:26 PM      To: [facility] Charge Pool  Please have patient see suzanne on Thursday for a wound check before the holiday weekend  02/25/15: lm for pt re appt on cell #, dpm

## 2015-02-26 ENCOUNTER — Telehealth: Payer: Self-pay

## 2015-02-26 ENCOUNTER — Telehealth: Payer: Self-pay | Admitting: Cardiovascular Disease

## 2015-02-26 ENCOUNTER — Telehealth: Payer: Self-pay | Admitting: Surgery

## 2015-02-26 ENCOUNTER — Encounter (HOSPITAL_COMMUNITY): Payer: Self-pay | Admitting: Surgery

## 2015-02-26 NOTE — Telephone Encounter (Signed)
Spoke with pt, he is scheduled for Thursday with Vinnie Level, dpm

## 2015-02-26 NOTE — Telephone Encounter (Signed)
Phone call from pt.  Reported his Percocet 5/325 mg is only lasting about 3-4 hrs.  Rated pain at 8/10, about 3 hrs after taking Percocet 2 tablets.  Stated the nurse changed the bandage today, and reported the incision looked okay.  Stated there is no redness or drainage.  Denied any separation of the incision.  Discussed with Dr. Kellie Simmering.  Gave verbal order to increase frequency of Percocet 5/325 mg, 1-2 tabs, to q 4 hrs/ prn.  Phone call to pt.  Advised of approval to take his pain medication as frequently as q 4hrs/ prn.  Pt. verb. understanding.

## 2015-02-26 NOTE — Telephone Encounter (Signed)
-----   Message from Mena Goes, RN sent at 02/25/2015 12:01 PM EDT ----- Regarding: Schedule   ----- Message -----    From: Ulyses Amor, PA-C    Sent: 02/25/2015  11:57 AM      To: [facility]vs Charge Pool  F/u with Dr. Trula Slade in 2 weeks s/p great toe amputation

## 2015-02-26 NOTE — Telephone Encounter (Signed)
-----   Message from Mena Goes, RN sent at 02/25/2015  1:39 PM EDT ----- Regarding: Schedule   ----- Message -----    From: [name], MD    Sent: 02/25/2015  12:04 PM      To: [facility] Charge Pool  Please schedule the patient to see Vinnie Level on Friday.  I would like for his toe amputation site to be evaluated before the long weekend.  This should be a very quick visit just to make sure the wound is healing.  I don't want him to have to deal with the emergency department if it does not heal, since this is a holiday weekend

## 2015-02-26 NOTE — Telephone Encounter (Signed)
Closed encounter °

## 2015-02-27 ENCOUNTER — Encounter: Payer: Self-pay | Admitting: Family

## 2015-02-28 ENCOUNTER — Encounter: Payer: Self-pay | Admitting: Family

## 2015-02-28 ENCOUNTER — Ambulatory Visit (INDEPENDENT_AMBULATORY_CARE_PROVIDER_SITE_OTHER): Payer: Self-pay | Admitting: Family

## 2015-02-28 ENCOUNTER — Other Ambulatory Visit: Payer: Self-pay

## 2015-02-28 VITALS — BP 118/84 | HR 115 | Temp 98.0°F | Resp 18 | Ht 69.0 in | Wt 223.0 lb

## 2015-02-28 DIAGNOSIS — R208 Other disturbances of skin sensation: Secondary | ICD-10-CM

## 2015-02-28 DIAGNOSIS — M25579 Pain in unspecified ankle and joints of unspecified foot: Secondary | ICD-10-CM | POA: Insufficient documentation

## 2015-02-28 DIAGNOSIS — T148XXA Other injury of unspecified body region, initial encounter: Secondary | ICD-10-CM | POA: Insufficient documentation

## 2015-02-28 DIAGNOSIS — I779 Disorder of arteries and arterioles, unspecified: Secondary | ICD-10-CM

## 2015-02-28 DIAGNOSIS — L24A9 Irritant contact dermatitis due friction or contact with other specified body fluids: Secondary | ICD-10-CM | POA: Insufficient documentation

## 2015-02-28 DIAGNOSIS — I999 Unspecified disorder of circulatory system: Secondary | ICD-10-CM

## 2015-02-28 DIAGNOSIS — R209 Unspecified disturbances of skin sensation: Secondary | ICD-10-CM | POA: Insufficient documentation

## 2015-02-28 DIAGNOSIS — M79672 Pain in left foot: Secondary | ICD-10-CM

## 2015-02-28 MED ORDER — OXYCODONE-ACETAMINOPHEN 10-325 MG PO TABS: 1.0000 | ORAL_TABLET | ORAL | Status: DC | PRN

## 2015-02-28 NOTE — Progress Notes (Signed)
    Postoperative Visit   History of Present Illness  Midas Daughety is a 64 y.o. male who presents for wound check: Amputation of left great toe including metatarsal head (Date: 02/22/15) for ischemic toe performed by Dr. Trula Slade.  The patient's wounds are well healed.  The patient notes pain is not well controlled taking 2 tabs Percocet 5/325 every 4 hours.  The patient's current symptoms are: cold left foot, severe pain in left foot at 8/10. Pt denies any problems with his right foot/leg; he is in a wheelchair. Home health was supposed to do dressing change of left foot yesterday but did not arrive. He denies fever, but always has felt cold even before the left toe amputation. Eliquis was started by Dr. Gwenlyn Found during this surgical visit in hospital.   For VQI Use Only  PRE-ADM LIVING: Home  AMB STATUS: Ambulatory  Physical Examination  Filed Vitals:   02/28/15 1241  BP: 118/84  Pulse: 115  Temp: 98 F (36.7 C)  TempSrc: Oral  Resp: 18  Height: 5\' 9"  (1.753 m)  Weight: 223 lb (101.152 kg)  SpO2: 98%   Body mass index is 32.92 kg/(m^2).  Left great toe amputation site iIncision is healing well.  Sutures are intact, no erythema, no signs of infection. Radial pulses and bilateral femoral pulses are 2+ palpable. Left pedal pulses are not palpable; left DP and PT pulses are absent by Doppler. Left peroneal pulse is audible by Doppler. Left foot is cool, no gangrene, but medial and dorsal aspects of left foot are slightly dusky.  Medical Decision Making  Joann Kulpa is a 64 y.o. male who presents s/p amputation of left great toe including metatarsal head (Date: 02/22/15) for ischemic toe. He has ischemic pain in left foot, absent DP and PT pulses by Doppler, left foot is cool and mildly dusky, no gangrene; toe amputation site is healing well however. Vascular surgeon spoke with pt and wife; after some discussion pt and wife decided to proceed with left BKA by Dr. Oneida Alar on Tuesday,  May 31. Stop Eliquis. Percocet 10/325, 1 tablet every 4 hours as needed for severe left foot ischemic pain that was not relieved by 2 tablets of 5/325 Percocet; pt and wife denies that he had any adverse reaction to Percocet.    NICKEL, Sharmon Leyden, RN, MSN, FNP-C [facility] Office: [phone]  02/28/2015, 12:55 PM  Clinic MD: Trula Slade

## 2015-02-28 NOTE — Patient Instructions (Signed)

## 2015-03-01 ENCOUNTER — Encounter (HOSPITAL_COMMUNITY)
Admission: RE | Admit: 2015-03-01 | Discharge: 2015-03-01 | Disposition: A | Discharge status: Home / Self Care | Payer: 59 | Source: Ambulatory Visit | Attending: Vascular Surgery | Admitting: Vascular Surgery

## 2015-03-01 ENCOUNTER — Encounter (HOSPITAL_COMMUNITY): Payer: Self-pay

## 2015-03-01 ENCOUNTER — Telehealth (HOSPITAL_COMMUNITY): Payer: Self-pay | Admitting: *Deleted

## 2015-03-01 HISTORY — DX: Peripheral vascular disease, unspecified: I73.9

## 2015-03-01 HISTORY — DX: Pneumonia, unspecified organism: J18.9

## 2015-03-01 HISTORY — DX: Reserved for inherently not codable concepts without codable children: IMO0001

## 2015-03-01 HISTORY — DX: Atherosclerotic heart disease of native coronary artery without angina pectoris: I25.10

## 2015-03-01 HISTORY — DX: Unspecified osteoarthritis, unspecified site: M19.90

## 2015-03-01 HISTORY — DX: Sleep apnea, unspecified: G47.30

## 2015-03-01 LAB — GLUCOSE, CAPILLARY: GLUCOSE-CAPILLARY: 125 mg/dL — AB (ref 65–99)

## 2015-03-01 NOTE — Progress Notes (Signed)
Pt. Has incentive spirometry- encouraged to use it as he awaits surgery.

## 2015-03-01 NOTE — Progress Notes (Signed)
   03/01/15 1524  OBSTRUCTIVE SLEEP APNEA  Have you ever been diagnosed with sleep apnea through a sleep study? Yes  If yes, do you have and use a CPAP or BPAP machine every night? 0  Do you snore loudly (loud enough to be heard through closed doors)?  0  Do you often feel tired, fatigued, or sleepy during the daytime? 1  Has anyone observed you stop breathing during your sleep? 0  Do you have, or are you being treated for high blood pressure? 1  BMI more than 35 kg/m2? 0  Age over 31 years old? 1  Neck circumference greater than 40 cm/16 inches? 1  Gender: 1

## 2015-03-01 NOTE — Progress Notes (Signed)
Pt. & wife understand the need to hold Eliquis, last dose was taken on 02/28/2015

## 2015-03-01 NOTE — Pre-Procedure Instructions (Signed)
Eric Price  03/01/2015      [facility] [place], [place] - [address] [place] [address] Phone: [phone] Fax: [phone]  [facility] - [place], [place] - [place] [address] [place] [address] [place] [address] Phone: [phone] Fax: [phone]    Your procedure is scheduled on 03/05/2015  Report to [facility] Admitting at 5:30 A.M.  Call this number if you have problems the morning of surgery:  [phone]   Remember:  Do not eat food or drink liquids after midnight.  On MONDAY  Take these medicines the morning of surgery with A SIP OF WATER.................... WELLBUTRIN, CARVEDILOL, GABAPENTIN, ZOLOFT, TAMULOSIN   Do not wear jewelry   Do not wear lotions, powders, or perfumes   Do not shave 48 hours prior to surgery.                Men may shave face and neck.   Do not bring valuables to the hospital.   [facility] is not responsible for any belongings or valuables.  Contacts, dentures or bridgework may not be worn into surgery.  Leave your suitcase in the car.  After surgery it may be brought to your room.  For patients admitted to the hospital, discharge time will be determined by your treatment team.  Patients discharged the day of surgery will not be allowed to drive home.   Name and phone number of your driver:   With wife Special instructions:  Special Instructions: [place] - Preparing for Surgery  Before surgery, you can play an important role.  Because skin is not sterile, your skin needs to be as free of germs as possible.  You can reduce the number of germs on you skin by washing with CHG (chlorahexidine gluconate) soap before surgery.  CHG is an antiseptic cleaner which kills germs and bonds with the skin to continue killing germs even after washing.  Please DO NOT use if you have an allergy to CHG or  antibacterial soaps.  If your skin becomes reddened/irritated stop using the CHG and inform your nurse when you arrive at Short Stay.  Do not shave (including legs and underarms) for at least 48 hours prior to the first CHG shower.  You may shave your face.  Please follow these instructions carefully:   1.  Shower with CHG Soap the night before surgery and the  morning of Surgery.  2.  If you choose to wash your hair, wash your hair first as usual with your  normal shampoo.  3.  After you shampoo, rinse your hair and body thoroughly to remove the  Shampoo.  4.  Use CHG as you would any other liquid soap.  You can apply chg directly to the skin and wash gently with scrungie or a clean washcloth.  5.  Apply the CHG Soap to your body ONLY FROM THE NECK DOWN.    Do not use on open wounds or open sores.  Avoid contact with your eyes, ears, mouth and genitals (private parts).  Wash genitals (private parts)   with your normal soap.  6.  Wash thoroughly, paying special attention to the area where your surgery will be performed.  7.  Thoroughly rinse your body with warm water from the neck down.  8.  DO NOT shower/wash with your normal soap after using and rinsing off   the  CHG Soap.  9.  Pat yourself dry with a clean towel.            10.  Wear clean pajamas.            11.  Place clean sheets on your bed the night of your first shower and do not sleep with pets.  Day of Surgery  Do not apply any lotions/deodorants the morning of surgery.  Please wear clean clothes to the hospital/surgery center.  Please read over the following fact sheets that you were given. Pain Booklet and Surgical Site Infection Prevention
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
## 2015-03-01 NOTE — Progress Notes (Signed)
Pt. Was seen by Dr. Percival Spanish but recently directed to Dr. Gwenlyn Found during the recent hospitalization. Pt. Discharged fr. Hosp. On 02/25/2015, will use last labs drawn as preop screen. Chart will be left for anesth. Review due cardiac, PVD hx.

## 2015-03-04 MED ORDER — DEXTROSE 5 % IV SOLN
1.5000 g | INTRAVENOUS | Status: AC
  Administered 2015-03-05: 1.5 g via INTRAVENOUS
  Filled 2015-03-04: qty 1.5

## 2015-03-04 MED ORDER — SODIUM CHLORIDE 0.9 % IV SOLN: INTRAVENOUS | Status: DC

## 2015-03-04 MED ORDER — CHLORHEXIDINE GLUCONATE CLOTH 2 % EX PADS: 6.0000 | MEDICATED_PAD | Freq: Once | CUTANEOUS | Status: DC

## 2015-03-05 ENCOUNTER — Ambulatory Visit: Payer: 59 | Admitting: Cardiovascular Disease

## 2015-03-05 ENCOUNTER — Ambulatory Visit: Payer: 59 | Admitting: Family Medicine

## 2015-03-05 ENCOUNTER — Inpatient Hospital Stay (HOSPITAL_COMMUNITY)
Admission: RE | Admit: 2015-03-05 | Discharge: 2015-03-08 | DRG: 240 | Disposition: A | Discharge status: Rehabilitation Facility | Payer: 59 | Source: Ambulatory Visit | Attending: Vascular Surgery | Admitting: Vascular Surgery

## 2015-03-05 ENCOUNTER — Inpatient Hospital Stay (HOSPITAL_COMMUNITY): Payer: 59 | Admitting: Vascular Surgery

## 2015-03-05 ENCOUNTER — Encounter (HOSPITAL_COMMUNITY): Payer: Self-pay | Admitting: Anesthesiology

## 2015-03-05 ENCOUNTER — Encounter (HOSPITAL_COMMUNITY)
Admission: RE | Disposition: A | Discharge status: Rehabilitation Facility | Payer: Self-pay | Source: Ambulatory Visit | Attending: Vascular Surgery

## 2015-03-05 ENCOUNTER — Inpatient Hospital Stay (HOSPITAL_COMMUNITY): Payer: 59 | Admitting: Anesthesiology

## 2015-03-05 DIAGNOSIS — Z6832 Body mass index (BMI) 32.0-32.9, adult: Secondary | ICD-10-CM

## 2015-03-05 DIAGNOSIS — N400 Enlarged prostate without lower urinary tract symptoms: Secondary | ICD-10-CM | POA: Diagnosis present

## 2015-03-05 DIAGNOSIS — G4733 Obstructive sleep apnea (adult) (pediatric): Secondary | ICD-10-CM | POA: Diagnosis present

## 2015-03-05 DIAGNOSIS — C8409 Mycosis fungoides, extranodal and solid organ sites: Secondary | ICD-10-CM | POA: Diagnosis present

## 2015-03-05 DIAGNOSIS — I10 Essential (primary) hypertension: Secondary | ICD-10-CM | POA: Diagnosis present

## 2015-03-05 DIAGNOSIS — I998 Other disorder of circulatory system: Secondary | ICD-10-CM | POA: Diagnosis present

## 2015-03-05 DIAGNOSIS — Z89512 Acquired absence of left leg below knee: Secondary | ICD-10-CM | POA: Diagnosis not present

## 2015-03-05 DIAGNOSIS — Z794 Long term (current) use of insulin: Secondary | ICD-10-CM | POA: Diagnosis not present

## 2015-03-05 DIAGNOSIS — I5022 Chronic systolic (congestive) heart failure: Secondary | ICD-10-CM | POA: Diagnosis present

## 2015-03-05 DIAGNOSIS — Z885 Allergy status to narcotic agent status: Secondary | ICD-10-CM | POA: Diagnosis not present

## 2015-03-05 DIAGNOSIS — M1389 Other specified arthritis, multiple sites: Secondary | ICD-10-CM | POA: Diagnosis present

## 2015-03-05 DIAGNOSIS — I82412 Acute embolism and thrombosis of left femoral vein: Secondary | ICD-10-CM | POA: Diagnosis present

## 2015-03-05 DIAGNOSIS — I70262 Atherosclerosis of native arteries of extremities with gangrene, left leg: Secondary | ICD-10-CM

## 2015-03-05 DIAGNOSIS — E1152 Type 2 diabetes mellitus with diabetic peripheral angiopathy with gangrene: Secondary | ICD-10-CM | POA: Diagnosis present

## 2015-03-05 DIAGNOSIS — Z7901 Long term (current) use of anticoagulants: Secondary | ICD-10-CM

## 2015-03-05 DIAGNOSIS — Z79899 Other long term (current) drug therapy: Secondary | ICD-10-CM

## 2015-03-05 DIAGNOSIS — E669 Obesity, unspecified: Secondary | ICD-10-CM | POA: Diagnosis present

## 2015-03-05 DIAGNOSIS — I2510 Atherosclerotic heart disease of native coronary artery without angina pectoris: Secondary | ICD-10-CM | POA: Diagnosis present

## 2015-03-05 DIAGNOSIS — F329 Major depressive disorder, single episode, unspecified: Secondary | ICD-10-CM | POA: Diagnosis present

## 2015-03-05 DIAGNOSIS — E1165 Type 2 diabetes mellitus with hyperglycemia: Secondary | ICD-10-CM | POA: Diagnosis not present

## 2015-03-05 DIAGNOSIS — D62 Acute posthemorrhagic anemia: Secondary | ICD-10-CM | POA: Diagnosis not present

## 2015-03-05 DIAGNOSIS — E1151 Type 2 diabetes mellitus with diabetic peripheral angiopathy without gangrene: Secondary | ICD-10-CM | POA: Diagnosis not present

## 2015-03-05 DIAGNOSIS — Z8249 Family history of ischemic heart disease and other diseases of the circulatory system: Secondary | ICD-10-CM

## 2015-03-05 DIAGNOSIS — I82442 Acute embolism and thrombosis of left tibial vein: Secondary | ICD-10-CM | POA: Diagnosis present

## 2015-03-05 DIAGNOSIS — R531 Weakness: Secondary | ICD-10-CM | POA: Diagnosis not present

## 2015-03-05 DIAGNOSIS — E785 Hyperlipidemia, unspecified: Secondary | ICD-10-CM | POA: Diagnosis present

## 2015-03-05 DIAGNOSIS — Z89412 Acquired absence of left great toe: Secondary | ICD-10-CM | POA: Diagnosis not present

## 2015-03-05 DIAGNOSIS — S88112D Complete traumatic amputation at level between knee and ankle, left lower leg, subsequent encounter: Secondary | ICD-10-CM | POA: Diagnosis not present

## 2015-03-05 HISTORY — PX: AMPUTATION: SHX166

## 2015-03-05 HISTORY — PX: BELOW KNEE LEG AMPUTATION: SUR23

## 2015-03-05 HISTORY — DX: Type 2 diabetes mellitus without complications: E11.9

## 2015-03-05 LAB — GLUCOSE, CAPILLARY
GLUCOSE-CAPILLARY: 111 mg/dL — AB (ref 65–99)
GLUCOSE-CAPILLARY: 146 mg/dL — AB (ref 65–99)
GLUCOSE-CAPILLARY: 241 mg/dL — AB (ref 65–99)

## 2015-03-05 SURGERY — AMPUTATION BELOW KNEE
Anesthesia: General | Site: Leg Lower | Laterality: Left

## 2015-03-05 MED ORDER — BISACODYL 10 MG RE SUPP: 10.0000 mg | Freq: Every day | RECTAL | Status: DC | PRN

## 2015-03-05 MED ORDER — 0.9 % SODIUM CHLORIDE (POUR BTL) OPTIME
TOPICAL | Status: DC | PRN
  Administered 2015-03-05: 1000 mL

## 2015-03-05 MED ORDER — SODIUM CHLORIDE 0.9 % IJ SOLN
INTRAMUSCULAR | Status: AC
  Filled 2015-03-05: qty 10

## 2015-03-05 MED ORDER — DEXTROSE 5 % IV SOLN
1.5000 g | Freq: Two times a day (BID) | INTRAVENOUS | Status: AC
  Administered 2015-03-05 – 2015-03-06 (×2): 1.5 g via INTRAVENOUS
  Filled 2015-03-05 (×2): qty 1.5

## 2015-03-05 MED ORDER — PROMETHAZINE HCL 25 MG/ML IJ SOLN: 6.2500 mg | INTRAMUSCULAR | Status: DC | PRN

## 2015-03-05 MED ORDER — PROPOFOL 10 MG/ML IV BOLUS
INTRAVENOUS | Status: AC
  Filled 2015-03-05: qty 20

## 2015-03-05 MED ORDER — PHENOL 1.4 % MT LIQD: 1.0000 | OROMUCOSAL | Status: DC | PRN

## 2015-03-05 MED ORDER — GUAIFENESIN-DM 100-10 MG/5ML PO SYRP: 15.0000 mL | ORAL_SOLUTION | ORAL | Status: DC | PRN

## 2015-03-05 MED ORDER — POTASSIUM CHLORIDE CRYS ER 20 MEQ PO TBCR: 20.0000 meq | EXTENDED_RELEASE_TABLET | Freq: Every day | ORAL | Status: DC | PRN

## 2015-03-05 MED ORDER — GABAPENTIN 300 MG PO CAPS
300.0000 mg | ORAL_CAPSULE | Freq: Two times a day (BID) | ORAL | Status: DC
  Administered 2015-03-05 – 2015-03-08 (×7): 300 mg via ORAL
  Filled 2015-03-05 (×8): qty 1

## 2015-03-05 MED ORDER — FENTANYL CITRATE (PF) 250 MCG/5ML IJ SOLN
INTRAMUSCULAR | Status: AC
  Filled 2015-03-05: qty 5

## 2015-03-05 MED ORDER — LACTATED RINGERS IV SOLN
INTRAVENOUS | Status: DC | PRN
  Administered 2015-03-05: 07:00:00 via INTRAVENOUS

## 2015-03-05 MED ORDER — NITROGLYCERIN 0.4 MG SL SUBL: 0.4000 mg | SUBLINGUAL_TABLET | SUBLINGUAL | Status: DC | PRN

## 2015-03-05 MED ORDER — GLYBURIDE 5 MG PO TABS
5.0000 mg | ORAL_TABLET | Freq: Two times a day (BID) | ORAL | Status: DC
  Administered 2015-03-05 – 2015-03-08 (×7): 5 mg via ORAL
  Filled 2015-03-05 (×9): qty 1

## 2015-03-05 MED ORDER — LIDOCAINE HCL (CARDIAC) 20 MG/ML IV SOLN
INTRAVENOUS | Status: AC
  Filled 2015-03-05: qty 5

## 2015-03-05 MED ORDER — MIDAZOLAM HCL 5 MG/5ML IJ SOLN
INTRAMUSCULAR | Status: DC | PRN
  Administered 2015-03-05 (×2): 1 mg via INTRAVENOUS

## 2015-03-05 MED ORDER — APIXABAN 5 MG PO TABS
5.0000 mg | ORAL_TABLET | Freq: Two times a day (BID) | ORAL | Status: DC
  Administered 2015-03-06 – 2015-03-08 (×5): 5 mg via ORAL
  Filled 2015-03-05 (×6): qty 1

## 2015-03-05 MED ORDER — FENOFIBRATE 54 MG PO TABS
54.0000 mg | ORAL_TABLET | Freq: Every day | ORAL | Status: DC
  Administered 2015-03-05 – 2015-03-08 (×4): 54 mg via ORAL
  Filled 2015-03-05 (×4): qty 1

## 2015-03-05 MED ORDER — OXYCODONE HCL 5 MG PO TABS
5.0000 mg | ORAL_TABLET | ORAL | Status: DC | PRN
  Administered 2015-03-06 – 2015-03-07 (×3): 10 mg via ORAL
  Administered 2015-03-07 (×2): 5 mg via ORAL
  Administered 2015-03-08: 10 mg via ORAL
  Administered 2015-03-08: 5 mg via ORAL
  Administered 2015-03-08: 10 mg via ORAL
  Filled 2015-03-05: qty 1
  Filled 2015-03-05 (×2): qty 2
  Filled 2015-03-05 (×2): qty 1
  Filled 2015-03-05 (×4): qty 2

## 2015-03-05 MED ORDER — FENTANYL CITRATE (PF) 100 MCG/2ML IJ SOLN
INTRAMUSCULAR | Status: DC | PRN
  Administered 2015-03-05 (×8): 25 ug via INTRAVENOUS
  Administered 2015-03-05 (×2): 50 ug via INTRAVENOUS
  Administered 2015-03-05: 25 ug via INTRAVENOUS

## 2015-03-05 MED ORDER — HYDROMORPHONE HCL 1 MG/ML IJ SOLN
INTRAMUSCULAR | Status: AC
  Filled 2015-03-05: qty 1

## 2015-03-05 MED ORDER — SERTRALINE HCL 100 MG PO TABS
100.0000 mg | ORAL_TABLET | Freq: Every day | ORAL | Status: DC
  Administered 2015-03-06 – 2015-03-08 (×3): 100 mg via ORAL
  Filled 2015-03-05 (×3): qty 1

## 2015-03-05 MED ORDER — CARVEDILOL 3.125 MG PO TABS
3.1250 mg | ORAL_TABLET | Freq: Two times a day (BID) | ORAL | Status: DC
  Administered 2015-03-05 – 2015-03-08 (×7): 3.125 mg via ORAL
  Filled 2015-03-05 (×8): qty 1

## 2015-03-05 MED ORDER — METFORMIN HCL 500 MG PO TABS
1000.0000 mg | ORAL_TABLET | Freq: Two times a day (BID) | ORAL | Status: DC
  Administered 2015-03-05 – 2015-03-08 (×7): 1000 mg via ORAL
  Filled 2015-03-05 (×9): qty 2

## 2015-03-05 MED ORDER — LOSARTAN POTASSIUM 50 MG PO TABS: 50.0000 mg | ORAL_TABLET | Freq: Every day | ORAL | Status: DC

## 2015-03-05 MED ORDER — OXYCODONE-ACETAMINOPHEN 10-325 MG PO TABS: 1.0000 | ORAL_TABLET | ORAL | Status: DC | PRN

## 2015-03-05 MED ORDER — OXYCODONE HCL 5 MG/5ML PO SOLN: 5.0000 mg | Freq: Once | ORAL | Status: DC | PRN

## 2015-03-05 MED ORDER — ATORVASTATIN CALCIUM 40 MG PO TABS
40.0000 mg | ORAL_TABLET | Freq: Two times a day (BID) | ORAL | Status: DC
  Administered 2015-03-05 – 2015-03-08 (×6): 40 mg via ORAL
  Filled 2015-03-05 (×7): qty 1

## 2015-03-05 MED ORDER — PANTOPRAZOLE SODIUM 40 MG PO TBEC
40.0000 mg | DELAYED_RELEASE_TABLET | Freq: Every day | ORAL | Status: DC
  Administered 2015-03-05 – 2015-03-08 (×4): 40 mg via ORAL
  Filled 2015-03-05 (×5): qty 1

## 2015-03-05 MED ORDER — ONDANSETRON HCL 4 MG/2ML IJ SOLN
INTRAMUSCULAR | Status: AC
  Filled 2015-03-05: qty 2

## 2015-03-05 MED ORDER — PROPOFOL 10 MG/ML IV BOLUS
INTRAVENOUS | Status: DC | PRN
  Administered 2015-03-05: 140 mg via INTRAVENOUS

## 2015-03-05 MED ORDER — LABETALOL HCL 5 MG/ML IV SOLN
10.0000 mg | INTRAVENOUS | Status: DC | PRN
  Filled 2015-03-05: qty 4

## 2015-03-05 MED ORDER — ACETAMINOPHEN 650 MG RE SUPP: 325.0000 mg | RECTAL | Status: DC | PRN

## 2015-03-05 MED ORDER — ONDANSETRON HCL 4 MG/2ML IJ SOLN
INTRAMUSCULAR | Status: DC | PRN
  Administered 2015-03-05: 4 mg via INTRAVENOUS

## 2015-03-05 MED ORDER — ROCURONIUM BROMIDE 50 MG/5ML IV SOLN
INTRAVENOUS | Status: AC
  Filled 2015-03-05: qty 1

## 2015-03-05 MED ORDER — ALUM & MAG HYDROXIDE-SIMETH 200-200-20 MG/5ML PO SUSP: 15.0000 mL | ORAL | Status: DC | PRN

## 2015-03-05 MED ORDER — SUCCINYLCHOLINE CHLORIDE 20 MG/ML IJ SOLN
INTRAMUSCULAR | Status: AC
  Filled 2015-03-05: qty 1

## 2015-03-05 MED ORDER — DOCUSATE SODIUM 100 MG PO CAPS
100.0000 mg | ORAL_CAPSULE | Freq: Every day | ORAL | Status: DC
Start: 2015-03-06 — End: 2015-03-08
  Administered 2015-03-06 – 2015-03-08 (×3): 100 mg via ORAL
  Filled 2015-03-05 (×3): qty 1

## 2015-03-05 MED ORDER — OXYCODONE HCL 5 MG PO TABS: 5.0000 mg | ORAL_TABLET | Freq: Once | ORAL | Status: DC | PRN

## 2015-03-05 MED ORDER — HYDROMORPHONE HCL 1 MG/ML IJ SOLN
0.2500 mg | INTRAMUSCULAR | Status: DC | PRN
  Administered 2015-03-05 (×4): 0.5 mg via INTRAVENOUS

## 2015-03-05 MED ORDER — SODIUM CHLORIDE 0.9 % IV SOLN
INTRAVENOUS | Status: DC
  Administered 2015-03-05: 22:00:00 via INTRAVENOUS
  Administered 2015-03-05: 75 mL/h via INTRAVENOUS

## 2015-03-05 MED ORDER — BUPROPION HCL ER (XL) 150 MG PO TB24
150.0000 mg | ORAL_TABLET | Freq: Every day | ORAL | Status: DC
  Administered 2015-03-06 – 2015-03-08 (×3): 150 mg via ORAL
  Filled 2015-03-05 (×3): qty 1

## 2015-03-05 MED ORDER — PHENYLEPHRINE 40 MCG/ML (10ML) SYRINGE FOR IV PUSH (FOR BLOOD PRESSURE SUPPORT)
PREFILLED_SYRINGE | INTRAVENOUS | Status: AC
  Filled 2015-03-05: qty 10

## 2015-03-05 MED ORDER — PHENYLEPHRINE HCL 10 MG/ML IJ SOLN
INTRAMUSCULAR | Status: DC | PRN
  Administered 2015-03-05: 80 ug via INTRAVENOUS

## 2015-03-05 MED ORDER — HYDROXYZINE HCL 10 MG PO TABS
10.0000 mg | ORAL_TABLET | Freq: Three times a day (TID) | ORAL | Status: DC | PRN
  Filled 2015-03-05: qty 1

## 2015-03-05 MED ORDER — OXYCODONE-ACETAMINOPHEN 5-325 MG PO TABS
1.0000 | ORAL_TABLET | ORAL | Status: DC | PRN
  Administered 2015-03-05: 1 via ORAL
  Administered 2015-03-05: 2 via ORAL
  Administered 2015-03-07 (×3): 1 via ORAL
  Filled 2015-03-05: qty 1
  Filled 2015-03-05: qty 2
  Filled 2015-03-05 (×3): qty 1

## 2015-03-05 MED ORDER — HYDRALAZINE HCL 20 MG/ML IJ SOLN: 5.0000 mg | INTRAMUSCULAR | Status: DC | PRN

## 2015-03-05 MED ORDER — LIDOCAINE HCL (CARDIAC) 20 MG/ML IV SOLN
INTRAVENOUS | Status: DC | PRN
  Administered 2015-03-05: 100 mg via INTRAVENOUS

## 2015-03-05 MED ORDER — ONDANSETRON HCL 4 MG/2ML IJ SOLN: 4.0000 mg | Freq: Four times a day (QID) | INTRAMUSCULAR | Status: DC | PRN

## 2015-03-05 MED ORDER — SENNOSIDES-DOCUSATE SODIUM 8.6-50 MG PO TABS: 1.0000 | ORAL_TABLET | Freq: Every evening | ORAL | Status: DC | PRN

## 2015-03-05 MED ORDER — HYDROMORPHONE HCL 1 MG/ML IJ SOLN
0.5000 mg | INTRAMUSCULAR | Status: DC | PRN
  Administered 2015-03-05 – 2015-03-06 (×5): 1 mg via INTRAVENOUS
  Filled 2015-03-05 (×5): qty 1

## 2015-03-05 MED ORDER — INSULIN ASPART 100 UNIT/ML ~~LOC~~ SOLN
0.0000 [IU] | Freq: Three times a day (TID) | SUBCUTANEOUS | Status: DC
  Administered 2015-03-05: 5 [IU] via SUBCUTANEOUS
  Administered 2015-03-06 – 2015-03-07 (×3): 2 [IU] via SUBCUTANEOUS
  Administered 2015-03-08: 3 [IU] via SUBCUTANEOUS

## 2015-03-05 MED ORDER — TRIAMCINOLONE ACETONIDE 0.1 % EX CREA: 1.0000 "application " | TOPICAL_CREAM | Freq: Every day | CUTANEOUS | Status: DC | PRN

## 2015-03-05 MED ORDER — CLOBETASOL PROPIONATE 0.05 % EX CREA
TOPICAL_CREAM | Freq: Two times a day (BID) | CUTANEOUS | Status: DC
  Administered 2015-03-05 – 2015-03-08 (×4): via TOPICAL
  Filled 2015-03-05: qty 15

## 2015-03-05 MED ORDER — CYCLOBENZAPRINE HCL 10 MG PO TABS
10.0000 mg | ORAL_TABLET | Freq: Three times a day (TID) | ORAL | Status: DC | PRN
  Administered 2015-03-06: 10 mg via ORAL
  Filled 2015-03-05 (×2): qty 1

## 2015-03-05 MED ORDER — MIDAZOLAM HCL 2 MG/2ML IJ SOLN
INTRAMUSCULAR | Status: AC
  Filled 2015-03-05: qty 2

## 2015-03-05 MED ORDER — EPHEDRINE SULFATE 50 MG/ML IJ SOLN
INTRAMUSCULAR | Status: AC
  Filled 2015-03-05: qty 1

## 2015-03-05 MED ORDER — ACETAMINOPHEN 10 MG/ML IV SOLN
1000.0000 mg | INTRAVENOUS | Status: AC
  Administered 2015-03-05: 1000 mg via INTRAVENOUS
  Filled 2015-03-05: qty 100

## 2015-03-05 MED ORDER — PRO-STAT SUGAR FREE PO LIQD
30.0000 mL | Freq: Two times a day (BID) | ORAL | Status: DC
  Administered 2015-03-05 – 2015-03-08 (×7): 30 mL via ORAL
  Filled 2015-03-05 (×8): qty 30

## 2015-03-05 MED ORDER — ARTIFICIAL TEARS OP OINT
TOPICAL_OINTMENT | OPHTHALMIC | Status: AC
  Filled 2015-03-05: qty 3.5

## 2015-03-05 MED ORDER — TAMSULOSIN HCL 0.4 MG PO CAPS
0.4000 mg | ORAL_CAPSULE | Freq: Every day | ORAL | Status: DC
  Administered 2015-03-06 – 2015-03-08 (×3): 0.4 mg via ORAL
  Filled 2015-03-05 (×3): qty 1

## 2015-03-05 MED ORDER — ACETAMINOPHEN 325 MG PO TABS: 325.0000 mg | ORAL_TABLET | ORAL | Status: DC | PRN

## 2015-03-05 MED ORDER — METOPROLOL TARTRATE 1 MG/ML IV SOLN: 2.0000 mg | INTRAVENOUS | Status: DC | PRN

## 2015-03-05 SURGICAL SUPPLY — 46 items
BANDAGE ELASTIC 6 VELCRO ST LF (GAUZE/BANDAGES/DRESSINGS) ×2 IMPLANT
BANDAGE ESMARK 6X9 LF (GAUZE/BANDAGES/DRESSINGS) IMPLANT
BLADE SAW RECIP 87.9 MT (BLADE) ×2 IMPLANT
BNDG CMPR 9X6 STRL LF SNTH (GAUZE/BANDAGES/DRESSINGS)
BNDG COHESIVE 6X5 TAN STRL LF (GAUZE/BANDAGES/DRESSINGS) ×2 IMPLANT
BNDG ESMARK 6X9 LF (GAUZE/BANDAGES/DRESSINGS)
BNDG GAUZE ELAST 4 BULKY (GAUZE/BANDAGES/DRESSINGS) ×3 IMPLANT
CANISTER SUCTION 2500CC (MISCELLANEOUS) ×2 IMPLANT
CLIP TI MEDIUM 6 (CLIP) IMPLANT
COVER SURGICAL LIGHT HANDLE (MISCELLANEOUS) ×2 IMPLANT
COVER TABLE BACK 60X90 (DRAPES) ×2 IMPLANT
CUFF TOURNIQUET SINGLE 18IN (TOURNIQUET CUFF) IMPLANT
CUFF TOURNIQUET SINGLE 24IN (TOURNIQUET CUFF) IMPLANT
CUFF TOURNIQUET SINGLE 34IN LL (TOURNIQUET CUFF) IMPLANT
CUFF TOURNIQUET SINGLE 44IN (TOURNIQUET CUFF) IMPLANT
DRAIN CHANNEL 19F RND (DRAIN) IMPLANT
DRAPE ORTHO SPLIT 77X108 STRL (DRAPES) ×4
DRAPE PROXIMA HALF (DRAPES) ×4 IMPLANT
DRAPE SURG ORHT 6 SPLT 77X108 (DRAPES) ×2 IMPLANT
DRSG ADAPTIC 3X8 NADH LF (GAUZE/BANDAGES/DRESSINGS) ×2 IMPLANT
ELECT REM PT RETURN 9FT ADLT (ELECTROSURGICAL) ×2
ELECTRODE REM PT RTRN 9FT ADLT (ELECTROSURGICAL) ×1 IMPLANT
EVACUATOR SILICONE 100CC (DRAIN) IMPLANT
GAUZE SPONGE 4X4 12PLY STRL (GAUZE/BANDAGES/DRESSINGS) ×4 IMPLANT
GLOVE BIO SURGEON STRL SZ7.5 (GLOVE) ×2 IMPLANT
GOWN STRL REUS W/ TWL LRG LVL3 (GOWN DISPOSABLE) ×3 IMPLANT
GOWN STRL REUS W/TWL LRG LVL3 (GOWN DISPOSABLE) ×6
KIT BASIN OR (CUSTOM PROCEDURE TRAY) ×2 IMPLANT
KIT ROOM TURNOVER OR (KITS) ×2 IMPLANT
NS IRRIG 1000ML POUR BTL (IV SOLUTION) ×2 IMPLANT
PACK GENERAL/GYN (CUSTOM PROCEDURE TRAY) ×2 IMPLANT
PAD ARMBOARD 7.5X6 YLW CONV (MISCELLANEOUS) ×4 IMPLANT
PADDING CAST COTTON 6X4 STRL (CAST SUPPLIES) ×1 IMPLANT
STAPLER VISISTAT 35W (STAPLE) ×2 IMPLANT
STOCKINETTE IMPERVIOUS LG (DRAPES) ×2 IMPLANT
SUT ETHILON 3 0 PS 1 (SUTURE) IMPLANT
SUT SILK 2 0 (SUTURE) ×4
SUT SILK 2 0 SH CR/8 (SUTURE) ×4 IMPLANT
SUT SILK 2-0 18XBRD TIE 12 (SUTURE) ×2 IMPLANT
SUT VIC AB 2-0 CT1 27 (SUTURE) ×4
SUT VIC AB 2-0 CT1 TAPERPNT 27 (SUTURE) ×2 IMPLANT
SUT VIC AB 2-0 SH 18 (SUTURE) ×6 IMPLANT
SUT VIC AB 3-0 SH 27 (SUTURE) ×4
SUT VIC AB 3-0 SH 27X BRD (SUTURE) ×2 IMPLANT
UNDERPAD 30X30 INCONTINENT (UNDERPADS AND DIAPERS) ×2 IMPLANT
WATER STERILE IRR 1000ML POUR (IV SOLUTION) ×2 IMPLANT

## 2015-03-05 NOTE — H&P (View-Only) (Signed)
    Postoperative Visit   History of Present Illness  Krzysztof Reichelt is a 64 y.o. male who presents for wound check: Amputation of left great toe including metatarsal head (Date: 02/22/15) for ischemic toe performed by Dr. Trula Slade.  The patient's wounds are well healed.  The patient notes pain is not well controlled taking 2 tabs Percocet 5/325 every 4 hours.  The patient's current symptoms are: cold left foot, severe pain in left foot at 8/10. Pt denies any problems with his right foot/leg; he is in a wheelchair. Home health was supposed to do dressing change of left foot yesterday but did not arrive. He denies fever, but always has felt cold even before the left toe amputation. Eliquis was started by Dr. Gwenlyn Found during this surgical visit in hospital.   For VQI Use Only  PRE-ADM LIVING: Home  AMB STATUS: Ambulatory  Physical Examination  Filed Vitals:   02/28/15 1241  BP: 118/84  Pulse: 115  Temp: 98 F (36.7 C)  TempSrc: Oral  Resp: 18  Height: 5\' 9"  (1.753 m)  Weight: 223 lb (101.152 kg)  SpO2: 98%   Body mass index is 32.92 kg/(m^2).  Left great toe amputation site iIncision is healing well.  Sutures are intact, no erythema, no signs of infection. Radial pulses and bilateral femoral pulses are 2+ palpable. Left pedal pulses are not palpable; left DP and PT pulses are absent by Doppler. Left peroneal pulse is audible by Doppler. Left foot is cool, no gangrene, but medial and dorsal aspects of left foot are slightly dusky.  Medical Decision Making  Yidel Teuscher is a 64 y.o. male who presents s/p amputation of left great toe including metatarsal head (Date: 02/22/15) for ischemic toe. He has ischemic pain in left foot, absent DP and PT pulses by Doppler, left foot is cool and mildly dusky, no gangrene; toe amputation site is healing well however. Vascular surgeon spoke with pt and wife; after some discussion pt and wife decided to proceed with left BKA by Dr. Oneida Alar on Tuesday,  May 31. Stop Eliquis. Percocet 10/325, 1 tablet every 4 hours as needed for severe left foot ischemic pain that was not relieved by 2 tablets of 5/325 Percocet; pt and wife denies that he had any adverse reaction to Percocet.    Tashawn Laswell, Sharmon Leyden, RN, MSN, FNP-C [facility] [place] Office: [phone]  02/28/2015, 12:55 PM  Clinic MD: Trula Slade

## 2015-03-05 NOTE — Op Note (Signed)
Procedure: Left below-knee amputation  Preoperative diagnosis: Gangrene left foot  Postoperative diagnosis: Same  Anesthesia Gen.  Assistant: Leontine Locket PA-C  Operative findings: well perfused muscle tissue Specimens: left leg  Operative details: After obtaining informed consent, the patient was taken to the operating room. The patient was placed in supine position on the operating table. After induction of general anesthesia and endotracheal intubation, the patient's entire left lower extremity was prepped and draped all the way down to the level of the ankle. Next a transverse incision was made approximately 4 fingerbreadths below the tibial tuberosity on the left leg.  The  incision was carried posteriorly to the midportion of the leg and then extended longitudinally to create a posterior flap. The subcutaneous tissues and fascia was taken down with cautery. Periosteum was raised on the tibia approximately 5 cm above the skin edge.  The periosteum was also raised on the fibula several centimeters above this. The tibia was then divided with a saw. The fibula was divided with a bone cutter. The leg was then elevated in the operative field and the amputation was completed posterior to the bones with an amputation knife. Hemostasis was then obtained with cautery and several suture ligatures. The tibial and sural nerves were pulled down into the field transected and allowed to retract up into the leg.  After hemostasis was obtained, the wound was thoroughly irrigated with  normal saline solution. The fascial edges were then reapproximated with interrupted 2-0 Vicryl sutures. Subcutaneous tissues were reapproximated using running 3-0 Vicryl suture. The skin was closed with staples. The patient tolerated the procedure well and there were no complications. Instrument sponge and  needle counts were correct at the end of the case. The patient was taken to the recovery room in stable condition.  Ruta Hinds, MD [facility] [place] Office: [phone] Pager: [phone]

## 2015-03-05 NOTE — Interval H&P Note (Signed)
History and Physical Interval Note:  03/05/2015 7:17 AM  Eric Price  has presented today for surgery, with the diagnosis of Ischemic left foot M62.89  The various methods of treatment have been discussed with the patient and family. After consideration of risks, benefits and other options for treatment, the patient has consented to  Procedure(s): AMPUTATION BELOW KNEE (Left) as a surgical intervention .  The patient's history has been reviewed, patient examined, no change in status, stable for surgery.  I have reviewed the patient's chart and labs.  Questions were answered to the patient's satisfaction.     Ruta Hinds

## 2015-03-05 NOTE — Transfer of Care (Signed)
Immediate Anesthesia Transfer of Care Note  Patient: Eric Price  Procedure(s) Performed: Procedure(s): Left AMPUTATION BELOW KNEE (Left)  Patient Location: PACU  Anesthesia Type:General  Level of Consciousness: oriented and sedated  Airway & Oxygen Therapy: Patient Spontanous Breathing and Patient connected to nasal cannula oxygen  Post-op Assessment: Report given to RN and Post -op Vital signs reviewed and stable  Post vital signs: Reviewed and stable  Last Vitals:  Filed Vitals:   03/05/15 0909  BP:   Pulse:   Temp: 37.5 C  Resp:     Complications: No apparent anesthesia complications

## 2015-03-05 NOTE — Progress Notes (Signed)
Pt with reasonable pain control Filed Vitals:   03/05/15 1154 03/05/15 1200 03/05/15 1209 03/05/15 1244  BP: 145/80  140/80 137/81  Pulse: 88 97 93 88  Temp:  97.5 F (36.4 C)  97.8 F (36.6 C)  TempSrc:    Oral  Resp: 14 25 14 18   Weight:      SpO2: 100% 99% 100% 97%     Stable on 2 W  Ruta Hinds, MD [facility] [place] Office: [phone] Pager: [phone]

## 2015-03-05 NOTE — Anesthesia Postprocedure Evaluation (Signed)
  Anesthesia Post-op Note  Patient: Eric Price  Procedure(s) Performed: Procedure(s): Left AMPUTATION BELOW KNEE (Left)  Patient Location: PACU  Anesthesia Type:General  Level of Consciousness: awake and alert   Airway and Oxygen Therapy: Patient Spontanous Breathing  Post-op Pain: mild  Post-op Assessment: Post-op Vital signs reviewed  Post-op Vital Signs: Reviewed  Last Vitals:  Filed Vitals:   03/05/15 1244  BP: 137/81  Pulse: 88  Temp: 36.6 C  Resp: 18    Complications: No apparent anesthesia complications

## 2015-03-05 NOTE — Anesthesia Preprocedure Evaluation (Addendum)
Anesthesia Evaluation  Patient identified by MRN, date of birth, ID band Patient awake    Reviewed: Allergy & Precautions, NPO status , Patient's Chart, lab work & pertinent test results  History of Anesthesia Complications (+) PONVNegative for: history of anesthetic complications  Airway Mallampati: II  TM Distance: >3 FB Neck ROM: Full    Dental  (+) Caps, Poor Dentition, Dental Advisory Given   Pulmonary neg pulmonary ROS,    Pulmonary exam normal       Cardiovascular hypertension, + angina + CAD and + Peripheral Vascular Disease Normal cardiovascular exam Study Conclusions  - Left ventricle: LVEF is 30 to 35% with diffuse hypokinesis, slightly worse in the infeiror wall No definite thrombus identified. The cavity size was normal. Wall thickness was increased in a pattern of moderate LVH.    Neuro/Psych PSYCHIATRIC DISORDERS Depression negative neurological ROS     GI/Hepatic negative GI ROS, Neg liver ROS,   Endo/Other  diabetesMorbid obesity  Renal/GU negative Renal ROS     Musculoskeletal  (+) Arthritis -,   Abdominal   Peds  Hematology  (+) anemia ,   Anesthesia Other Findings   Reproductive/Obstetrics                            Lab Results  Component Value Date   WBC 7.2 02/23/2015   HGB 12.3* 02/23/2015   HCT 37.0* 02/23/2015   MCV 82.6 02/23/2015   PLT 369 02/23/2015   Lab Results  Component Value Date   CREATININE 0.84 02/23/2015   BUN 7 02/23/2015   NA 139 02/23/2015   K 4.2 02/23/2015   CL 104 02/23/2015   CO2 28 02/23/2015    Anesthesia Physical  Anesthesia Plan  ASA: III  Anesthesia Plan: General   Post-op Pain Management:    Induction: Intravenous  Airway Management Planned: LMA and Oral ETT  Additional Equipment:   Intra-op Plan:   Post-operative Plan:   Informed Consent: I have reviewed the patients History and Physical, chart, labs  and discussed the procedure including the risks, benefits and alternatives for the proposed anesthesia with the patient or authorized representative who has indicated his/her understanding and acceptance.   Dental advisory given  Plan Discussed with: CRNA  Anesthesia Plan Comments:        Anesthesia Quick Evaluation

## 2015-03-05 NOTE — Anesthesia Procedure Notes (Signed)
Procedure Name: LMA Insertion Date/Time: 03/05/2015 7:34 AM Performed by: Susa Loffler Pre-anesthesia Checklist: Patient identified, Timeout performed, Emergency Drugs available, Suction available and Patient being monitored Patient Re-evaluated:Patient Re-evaluated prior to inductionOxygen Delivery Method: Circle system utilized Preoxygenation: Pre-oxygenation with 100% oxygen Intubation Type: IV induction LMA: LMA inserted LMA Size: 4.0 Number of attempts: 1 Placement Confirmation: positive ETCO2 and breath sounds checked- equal and bilateral Tube secured with: Tape Dental Injury: Teeth and Oropharynx as per pre-operative assessment

## 2015-03-05 NOTE — Evaluation (Signed)
Physical Therapy Evaluation Patient Details Name: Eric Price MRN: [id number] DOB: 12/13/1950 Today's Date: 03/05/2015   History of Present Illness  Pt is a 64 y/o male who presents with an ischemic L foot and is now s/p L BKA on 03/05/15.   Clinical Impression  Pt admitted with above diagnosis. Pt currently with functional limitations due to the deficits listed below (see PT Problem List). At the time of PT eval pt was able to perform transfers and ambulation with min assist. +2 was present during session however was mostly utilized for safety and management of the IV pole. Pt was educated on proper positioning of the residual limb, as well as basic therapeutic exercise. Pt will benefit from skilled PT to increase their independence and safety with mobility to allow discharge to the venue listed below.       Follow Up Recommendations Home health PT;Supervision/Assistance - 24 hour    Equipment Recommendations  None recommended by PT    Recommendations for Other Services       Precautions / Restrictions Precautions Precautions: Fall Restrictions Weight Bearing Restrictions: Yes LLE Weight Bearing: Non weight bearing      Mobility  Bed Mobility Overal bed mobility: Needs Assistance Bed Mobility: Supine to Sit     Supine to sit: Min assist     General bed mobility comments: Assist for LE movement and trunk support as pt transitions to full sitting EOB.   Transfers Overall transfer level: Needs assistance Equipment used: Rolling walker (2 wheeled) Transfers: Sit to/from Stand Sit to Stand: Min assist;+2 safety/equipment Stand pivot transfers: Min guard;+2 safety/equipment       General transfer comment: +2 present for safety, however pt was able to power-up to full standing with +1 min assist. Pt showed good control to take 4 hopping steps from bed to chair.  Ambulation/Gait             General Gait Details: Pivotal steps around from bed>chair.   Stairs             Wheelchair Mobility    Modified Rankin (Stroke Patients Only)       Balance Overall balance assessment: Needs assistance Sitting-balance support: Feet supported;No upper extremity supported Sitting balance-Leahy Scale: Fair     Standing balance support: Bilateral upper extremity supported Standing balance-Leahy Scale: Poor Standing balance comment: Requires UE support for standing balance.                              Pertinent Vitals/Pain Pain Assessment: Faces Faces Pain Scale: Hurts a little bit Pain Location: L residual limb Pain Descriptors / Indicators: Aching Pain Intervention(s): Limited activity within patient's tolerance;Monitored during session;Repositioned    Home Living Family/patient expects to be discharged to:: Private residence Living Arrangements: Spouse/significant other Available Help at Discharge: Family;Available 24 hours/day Type of Home: House Home Access: Stairs to enter Entrance Stairs-Rails: Psychiatric nurse of Steps: 5 Home Layout: Two level;Able to live on main level with bedroom/bathroom Home Equipment: Gilford Rile - 2 wheels      Prior Function Level of Independence: Needs assistance   Gait / Transfers Assistance Needed: Using the RW  ADL's / Homemaking Assistance Needed: Wife assisting with ADL's        Hand Dominance        Extremity/Trunk Assessment   Upper Extremity Assessment: Overall WFL for tasks assessed           Lower Extremity Assessment: LLE  deficits/detail   LLE Deficits / Details: Decreased strength and AROM consistent with recent BKA.   Cervical / Trunk Assessment: Normal  Communication   Communication: No difficulties  Cognition Arousal/Alertness: Awake/alert Behavior During Therapy: WFL for tasks assessed/performed Overall Cognitive Status: Within Functional Limits for tasks assessed                      General Comments      Exercises General Exercises  - Lower Extremity Quad Sets: 10 reps Hip ABduction/ADduction: 5 reps;AAROM Straight Leg Raises: 5 reps;AAROM      Assessment/Plan    PT Assessment Patient needs continued PT services  PT Diagnosis Difficulty walking;Acute pain   PT Problem List Decreased strength;Decreased range of motion;Decreased activity tolerance;Decreased balance;Decreased mobility;Decreased knowledge of use of DME;Decreased safety awareness;Decreased knowledge of precautions;Pain  PT Treatment Interventions DME instruction;Gait training;Stair training;Functional mobility training;Therapeutic activities;Therapeutic exercise;Patient/family education   PT Goals (Current goals can be found in the Care Plan section) Acute Rehab PT Goals Patient Stated Goal: To return home soon PT Goal Formulation: With patient/family Time For Goal Achievement: 03/12/15 Potential to Achieve Goals: Good    Frequency Min 3X/week   Barriers to discharge        Co-evaluation               End of Session Equipment Utilized During Treatment: Gait belt Activity Tolerance: Patient tolerated treatment well Patient left: in chair;with call bell/phone within reach;with family/visitor present Nurse Communication: Mobility status         Time: 2706-2376 PT Time Calculation (min) (ACUTE ONLY): 30 min   Charges:   PT Evaluation $Initial PT Evaluation Tier I: 1 Procedure PT Treatments $Gait Training: 8-22 mins   PT G Codes:        Rolinda Roan Mar 14, 2015, 3:00 PM   Rolinda Roan, PT, DPT Acute Rehabilitation Services Pager: [phone]

## 2015-03-06 ENCOUNTER — Telehealth: Payer: Self-pay | Admitting: Vascular Surgery

## 2015-03-06 ENCOUNTER — Encounter (HOSPITAL_COMMUNITY): Payer: Self-pay | Admitting: Vascular Surgery

## 2015-03-06 DIAGNOSIS — E1151 Type 2 diabetes mellitus with diabetic peripheral angiopathy without gangrene: Secondary | ICD-10-CM

## 2015-03-06 DIAGNOSIS — S88112D Complete traumatic amputation at level between knee and ankle, left lower leg, subsequent encounter: Secondary | ICD-10-CM

## 2015-03-06 LAB — BASIC METABOLIC PANEL
Anion gap: 10 (ref 5–15)
BUN: 9 mg/dL (ref 6–20)
CO2: 25 mmol/L (ref 22–32)
Calcium: 9 mg/dL (ref 8.9–10.3)
Chloride: 98 mmol/L — ABNORMAL LOW (ref 101–111)
Creatinine, Ser: 0.78 mg/dL (ref 0.61–1.24)
GFR calc Af Amer: >60 mL/min (ref >60)
GFR calc non Af Amer: >60 mL/min (ref >60)
GLUCOSE: 137 mg/dL — AB (ref 65–99)
Potassium: 4.1 mmol/L (ref 3.5–5.1)
Sodium: 133 mmol/L — ABNORMAL LOW (ref 135–145)

## 2015-03-06 LAB — CBC
HEMATOCRIT: 32.8 % — AB (ref 39.0–52.0)
HEMOGLOBIN: 11 g/dL — AB (ref 13.0–17.0)
MCH: 27.6 pg (ref 26.0–34.0)
MCHC: 33.5 g/dL (ref 30.0–36.0)
MCV: 82.2 fL (ref 78.0–100.0)
Platelets: 413 10*3/uL — ABNORMAL HIGH (ref 150–400)
RBC: 3.99 MIL/uL — ABNORMAL LOW (ref 4.22–5.81)
RDW: 12.8 % (ref 11.5–15.5)
WBC: 10.9 10*3/uL — ABNORMAL HIGH (ref 4.0–10.5)

## 2015-03-06 LAB — GLUCOSE, CAPILLARY
Glucose-Capillary: 106 mg/dL — ABNORMAL HIGH (ref 65–99)
Glucose-Capillary: 114 mg/dL — ABNORMAL HIGH (ref 65–99)
Glucose-Capillary: 139 mg/dL — ABNORMAL HIGH (ref 65–99)
Glucose-Capillary: 166 mg/dL — ABNORMAL HIGH (ref 65–99)

## 2015-03-06 LAB — HEMOGLOBIN A1C
HEMOGLOBIN A1C: 8.1 % — AB (ref 4.8–5.6)
Mean Plasma Glucose: 186 mg/dL

## 2015-03-06 NOTE — Progress Notes (Signed)
Inpatient Diabetes Program Recommendations  AACE/ADA: New Consensus Statement on Inpatient Glycemic Control  Target Ranges:  Prepandial:   less than 140 mg/dL      Peak postprandial:   less than 180 mg/dL (1-2 hours)      Critically ill patients:  140 - 180 mg/dL  Pager:  [phone]    Reason for Visit: Consult but glucose this am 136 mg/dL.  Inpatient Diabetes Program Recommendations Insulin - Basal: May need to add a lower dose of Levemir since ischemic toe has been removed.    Courtney Heys PhD, RN, BC-ADM Diabetes Coordinator  Office:  [phone] Team Pager:  [phone]
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
## 2015-03-06 NOTE — Progress Notes (Signed)
Physical Therapy Treatment Patient Details Name: Eric Price MRN: [id number] DOB: [birth year] Today's Date: 03/06/2015    History of Present Illness Pt is a 64 y.o. 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     PT Comments    Pt progressing towards physical therapy goals. Family not present during initial evaluation, and today they were present and able to provide a more realistic view of the available assistance at home. Per the wife and family, wife is in poor health and is not able to provide physical assistance for the pt. It appears she can be available for 24 hour supervision however will need further assistance from a second person coming in and out of the home for physical assist which is not available. In light of this new information, recommending CIR for further strengthening, and mobility training to improve independence and safety prior to return home. Pt is anticipating progression to a prothesis in the future and will benefit from more intensive therapy at this time.  Follow Up Recommendations  CIR     Equipment Recommendations  Wheelchair (measurements PT);Wheelchair cushion (measurements PT)    Recommendations for Other Services Rehab consult     Precautions / Restrictions Precautions Precautions: Fall Precaution Comments: Discussed positioning with no pillow under knee.  Restrictions Weight Bearing Restrictions: Yes LLE Weight Bearing: Non weight bearing    Mobility  Bed Mobility Overal bed mobility: Needs Assistance Bed Mobility: Supine to Sit     Supine to sit: Min assist     General bed mobility comments: Pt was received sitting up in the recliner chair.   Transfers Overall transfer level: Needs assistance Equipment used: Rolling walker (2 wheeled) Transfers: Sit to/from Stand Sit to Stand: Min guard;Min assist Stand pivot transfers: Min guard       General transfer comment: Initially min assist provided for  power-up to full stand. On second stand attempt pt required min guard assist to achieve full standing. VC's for hand placement on seated surface for safety.   Ambulation/Gait Ambulation/Gait assistance: Min guard Ambulation Distance (Feet): 40 Feet Assistive device: Rolling walker (2 wheeled) Gait Pattern/deviations: Step-to pattern;Decreased stride length;Trunk flexed Gait velocity: Decreased Gait velocity interpretation: Below normal speed for age/gender General Gait Details: Pt was able to ambulate ~20 feet and then required a seated rest break due to fatigue. Pt was able to ambulate back to the recliner without difficulty.    Stairs            Wheelchair Mobility    Modified Rankin (Stroke Patients Only)       Balance Overall balance assessment: Needs assistance Sitting-balance support: Feet supported;No upper extremity supported Sitting balance-Leahy Scale: Fair     Standing balance support: Bilateral upper extremity supported;During functional activity Standing balance-Leahy Scale: Poor                      Cognition Arousal/Alertness: Awake/alert Behavior During Therapy: WFL for tasks assessed/performed Overall Cognitive Status: Within Functional Limits for tasks assessed (Slightly confused at times but overall [facility])                      Exercises General Exercises - Lower Extremity Quad Sets: 10 reps    General Comments General comments (skin integrity, edema, etc.): Further discussed positioning and pt/family were educated on keeping the knee extended with no pillow/foam under. Also discussed lying completely supine to allow hip flexors to stretch  out.       Pertinent Vitals/Pain Pain Assessment: 0-10 Pain Score: 5  Pain Location: Residual limb Pain Descriptors / Indicators: Aching Pain Intervention(s): Limited activity within patient's tolerance;Monitored during session;Repositioned;Premedicated before session    Home Living  Family/patient expects to be discharged to:: Private residence Living Arrangements: Spouse/significant other Available Help at Discharge: Family;Available 24 hours/day (wife can't do a lot of physical assistance) Type of Home: House Home Access: Stairs to enter Entrance Stairs-Rails: Right;Left Home Layout: Two level;Able to live on main level with bedroom/bathroom Home Equipment: Gilford Rile - 2 wheels      Prior Function Level of Independence: Needs assistance  Gait / Transfers Assistance Needed: Using the RW ADL's / Homemaking Assistance Needed: Wife assisting with ADLs     PT Goals (current goals can now be found in the care plan section) Acute Rehab PT Goals Patient Stated Goal: not stated PT Goal Formulation: With patient/family Time For Goal Achievement: 03/12/15 Potential to Achieve Goals: Good Progress towards PT goals: Progressing toward goals    Frequency  Min 3X/week    PT Plan Discharge plan needs to be updated    Co-evaluation             End of Session Equipment Utilized During Treatment: Gait belt Activity Tolerance: Patient tolerated treatment well Patient left: in chair;with call bell/phone within reach;with family/visitor present     Time: 1200-1227 PT Time Calculation (min) (ACUTE ONLY): 27 min  Charges:  $Gait Training: 8-22 mins $Therapeutic Activity: 8-22 mins                    G Codes:      Rolinda Roan 03/10/2015, 2:27 PM   Rolinda Roan, PT, DPT Acute Rehabilitation Services Pager: [phone]

## 2015-03-06 NOTE — Progress Notes (Addendum)
[facility]  Subjective  - Doing well over all.   Objective 151/84 109 98.1 F (36.7 C) (Oral) 18 94%  Intake/Output Summary (Last 24 hours) at 03/06/15 0825 Last data filed at 03/06/15 0406  Gross per 24 hour  Intake    995 ml  Output   1000 ml  Net     -5 ml    Left AKA incision clean and dry Dry clean dressing applied  Assessment/Planning: POD # 1 Left BKA  Will order biotech  Mobility encouraged  Laurence Slate [facility] 03/06/2015 8:25 AM -- Restart Eliquis today. Pain reasonable control. PT/OT Rehab.  Ruta Hinds, MD [facility] Office: [phone] Pager: [phone]  Laboratory Lab Results:  Recent Labs  03/06/15 0453  WBC 10.9*  HGB 11.0*  HCT 32.8*  PLT 413*   BMET  Recent Labs  03/06/15 0453  NA 133*  K 4.1  CL 98*  CO2 25  GLUCOSE 137*  BUN 9  CREATININE 0.78  CALCIUM 9.0    COAG Lab Results  Component Value Date   INR 0.96 02/19/2015   INR 1.02 10/20/2013   INR 0.92 11/30/2012   No results found for: PTT

## 2015-03-06 NOTE — Care Management Utilization Note (Signed)
UR completed.    Zeda Gangwer Wise Scottie Metayer, RN, BSN [phone] 

## 2015-03-06 NOTE — Progress Notes (Signed)
[facility]  Patient Status: Active (receiving services up to time of hospitalization)  [facility] is providing the following services: RN and PT  If patient discharges after hours, please call [phone].   Eric Price 03/06/2015, 2:34 PM

## 2015-03-06 NOTE — Evaluation (Addendum)
Occupational Therapy Evaluation Patient Details Name: Eric Price MRN: [id number] DOB: 02/19/1951 Today's Date: 03/06/2015    History of Present Illness Pt is a 64 y.o. 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    Clinical Impression    Pt s/p above. Wife assisting with ADLs, PTA. Feel pt will benefit from acute OT to increase independence with BADLs and mobility prior to d/c. Recommending CIR for rehab as pt's wife has medical issues and can't do a lot of lifting.    Follow Up Recommendations  CIR    Equipment Recommendations  3 in 1 bedside comode    Recommendations for Other Services       Precautions / Restrictions Precautions Precautions: Fall Precaution Comments: no pillow directly under knee-discussed NWB and no pillow under knee in session Restrictions Weight Bearing Restrictions: Yes LLE Weight Bearing: Non weight bearing      Mobility Bed Mobility Overal bed mobility: Needs Assistance Bed Mobility: Supine to Sit     Supine to sit: Min assist     General bed mobility comments: cues for technique and assist given.  Transfers Overall transfer level: Needs assistance   Transfers: Sit to/from [employer] Transfers Sit to Stand: Min guard;Mod assist Stand pivot transfers: Min guard       General transfer comment: Mod assist for sit to stand from bed. Min guard for sit to stand from recliner chair. Cues for technique.    Balance  Min assist for balance with ambulation and assist with balance when standing from bed.                                          ADL Overall ADL's : Needs assistance/impaired     Grooming: Set up;Sitting           Upper Body Dressing : Set up;Sitting   Lower Body Dressing: Minimal assistance;Sit to/from stand   Toilet Transfer: Min guard;Minimal assistance;Stand-pivot;Ambulation;RW;Moderate assistance (bed/chair; Mod assist to stand from bed; Min guard to stand  from recliner chair; Min guard for stand pivot transfer)            Functional mobility during ADLs: Minimal assistance;Min guard;Rolling walker (Min guard-stand pivot; Min assist for ambulation) General ADL Comments: Educated on desensitization techniques for LLE. Educated on LB dressing technique.  Discussed options for shower chair and two uses of 3 in 1.  Educated on chair pushups.     Vision     Perception     Praxis      Pertinent Vitals/Pain Pain Assessment: 0-10 Pain Score: 10-Worst pain ever Pain Location: LLE Pain Descriptors / Indicators: Pins and needles Pain Intervention(s): Repositioned;Monitored during session; notified nurse for pain meds     Hand Dominance     Extremity/Trunk Assessment Upper Extremity Assessment Upper Extremity Assessment: Overall WFL for tasks assessed   Lower Extremity Assessment Lower Extremity Assessment: Defer to PT evaluation       Communication Communication Communication: No difficulties   Cognition Arousal/Alertness: Awake/alert Behavior During Therapy: WFL for tasks assessed/performed Overall Cognitive Status: Within Functional Limits for tasks assessed                     General Comments       Exercises       Shoulder Instructions      Home Living Family/patient  expects to be discharged to:: Private residence Living Arrangements: Spouse/significant other Available Help at Discharge: Family;Available 24 hours/day (wife can't do a lot of physical assistance) Type of Home: House Home Access: Stairs to enter [employer] of Steps: 5 Entrance Stairs-Rails: Right;Left Home Layout: Two level;Able to live on main level with bedroom/bathroom     Bathroom Shower/Tub: Walk-in shower;Curtain   Bathroom Toilet: Handicapped height     Home Equipment: Environmental consultant - 2 wheels          Prior Functioning/Environment Level of Independence: Needs assistance  Gait / Transfers Assistance Needed: Using the  RW ADL's / Homemaking Assistance Needed: Wife assisting with ADLs        OT Diagnosis: Acute pain   OT Problem List: Impaired balance (sitting and/or standing);Decreased knowledge of use of DME or AE;Decreased knowledge of precautions;Pain;Impaired sensation   OT Treatment/Interventions: Self-care/ADL training;Therapeutic exercise;DME and/or AE instruction;Therapeutic activities;Patient/family education;Balance training    OT Goals(Current goals can be found in the care plan section) Acute Rehab OT Goals Patient Stated Goal: not stated OT Goal Formulation: With patient Time For Goal Achievement: 03/13/15 Potential to Achieve Goals: Good ADL Goals Pt Will Perform Lower Body Dressing: with set-up;with supervision;sit to/from stand Pt Will Transfer to Toilet: with supervision;ambulating;bedside commode Pt Will Perform Toileting - Clothing Manipulation and hygiene: with set-up;with supervision;sit to/from stand Additional ADL Goal #1: Pt will be independent with desensitization techniques for LLE.  OT Frequency: Min 2X/week   Barriers to D/C:            Co-evaluation              End of Session Equipment Utilized During Treatment: Gait belt;Rolling walker Nurse Communication: Other (comment) (pt in pain-family asked about meds)  Activity Tolerance: Patient tolerated treatment well Patient left: in chair;with call bell/phone within reach;with family/visitor present;Other (comment) (nursing tech in room)   Time: 1040-1100 OT Time Calculation (min): 20 min Charges:  OT General Charges $OT Visit: 1 Procedure OT Evaluation $Initial OT Evaluation Tier I: 1 Procedure G-CodesBenito Mccreedy OTR/L [phone] 03/06/2015, 11:57 AM

## 2015-03-06 NOTE — Telephone Encounter (Signed)
-----   Message from Mena Goes, RN sent at 03/05/2015 11:37 AM EDT ----- Regarding: Schedule   ----- Message -----    From: Gabriel Earing, PA-C    Sent: 03/05/2015   9:09 AM      To: [facility] Charge Pool  S/p left BKA 03/05/15.  F/u with Dr. Oneida Alar in 4 weeks.  Thanks, Aldona Bar

## 2015-03-06 NOTE — Progress Notes (Signed)
Orthopedic Tech Progress Note Patient Details:  Eric Price 1950/11/06 [id number]  Patient ID: Eric Price, male   DOB: Jan 02, 1951, 64 y.o.   MRN: [id number] Called in bio-tech brace order; spoke with Eric Price, Eric Price 03/06/2015, 11:15 AM

## 2015-03-06 NOTE — Discharge Instructions (Signed)
Information on my medicine - ELIQUIS (apixaban)  This medication education was reviewed with me or my healthcare representative as part of my discharge preparation.  The pharmacist that spoke with me during my hospital stay was:  Tomoki Lucken, [place], [place]  Why was Eliquis prescribed for you? Eliquis was prescribed for you to reduce the risk of forming blood clots that can cause a stroke if you have a medical condition called atrial fibrillation (a type of irregular heartbeat) OR to reduce the risk of a blood clots forming after orthopedic surgery.  What do You need to know about Eliquis ? Take your Eliquis TWICE DAILY - one tablet in the morning and one tablet in the evening with or without food.  It would be best to take the doses about the same time each day.  If you have difficulty swallowing the tablet whole please discuss with your pharmacist how to take the medication safely.  Take Eliquis exactly as prescribed by your doctor and DO NOT stop taking Eliquis without talking to the doctor who prescribed the medication.  Stopping may increase your risk of developing a new clot or stroke.  Refill your prescription before you run out.  After discharge, you should have regular check-up appointments with your healthcare provider that is prescribing your Eliquis.  In the future your dose may need to be changed if your kidney function or weight changes by a significant amount or as you get older.  What do you do if you miss a dose? If you miss a dose, take it as soon as you remember on the same day and resume taking twice daily.  Do not take more than one dose of ELIQUIS at the same time.  Important Safety Information A possible side effect of Eliquis is bleeding. You should call your healthcare provider right away if you experience any of the following: ? Bleeding from an injury or your nose that does not stop. ? Unusual colored urine (red or dark brown) or unusual colored stools (red or  black). ? Unusual bruising for unknown reasons. ? A serious fall or if you hit your head (even if there is no bleeding).  Some medicines may interact with Eliquis and might increase your risk of bleeding or clotting while on Eliquis. To help avoid this, consult your healthcare provider or pharmacist prior to using any new prescription or non-prescription medications, including herbals, vitamins, non-steroidal anti-inflammatory drugs (NSAIDs) and supplements.  This website has more information on Eliquis (apixaban): [email or web address].
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
## 2015-03-06 NOTE — Progress Notes (Signed)
Rehab admissions - I met with pt's wife today (pt was sleeping during my visit) in follow up to rehab MD consult. Further information was given about our rehab program and questions were answered. I gave pt's wife my contact information and brochures. She states that both pt and herself would like to pursue inpatient rehab.   Wife explained that she has health issues and cannot provide any physical assistance for her husband. I explained to pt's wife that we will need insurance approval to consider a possible rehab admit.  I then clarified pt's insurance coverage and pt has primary insurance with [employer] and secondary insurance through [facility]. We will need insurance authorization from both insurance carriers to consider a possible inpatient rehab admit. I called and spoke with [employer] representative to confirm that [employer] is indeed secondary but that we will need [employer] pre-authorization for CIR.  I will open pt's case with both carriers and share updates as they are available.   I spoke with Edwin Cap, case manager earlier today about pt's case.  Nanetta Batty, PT Rehabilitation Admissions Coordinator [phone]

## 2015-03-06 NOTE — Telephone Encounter (Signed)
VM box is full at this time, I have mailed an appointment letter, dpm

## 2015-03-06 NOTE — Care Management Note (Signed)
Case Management Note  Patient Details  Name: Columbus Ice MRN: [id number] Date of Birth: 20-Oct-1950  Subjective/Objective:        Left BKA            Action/Plan:  Lives at home with wife Expected Discharge Date:    03/08/2015              Expected Discharge Plan:  [place]  In-House Referral:     Discharge planning Services  CM Consult   Status of Service:  Completed, signed off  Medicare Important Message Given:  No Date Medicare IM Given:    Medicare IM give by:    Date Additional Medicare IM Given:    Additional Medicare Important Message give by:     If discussed at [place] of Stay Meetings, dates discussed:    Additional Comments: Contacted IP rehab coordinator, and pt is agreement to CIR. Waiting insurance approval. Jonnie Finner RN CCM Case Mgmt phone [phone] Erenest Rasher, RN 03/06/2015, 4:31 PM

## 2015-03-07 LAB — CBC
HCT: 33.7 % — ABNORMAL LOW (ref 39.0–52.0)
HEMOGLOBIN: 11.2 g/dL — AB (ref 13.0–17.0)
MCH: 27.4 pg (ref 26.0–34.0)
MCHC: 33.2 g/dL (ref 30.0–36.0)
MCV: 82.4 fL (ref 78.0–100.0)
Platelets: 446 10*3/uL — ABNORMAL HIGH (ref 150–400)
RBC: 4.09 MIL/uL — AB (ref 4.22–5.81)
RDW: 12.7 % (ref 11.5–15.5)
WBC: 11.9 10*3/uL — ABNORMAL HIGH (ref 4.0–10.5)

## 2015-03-07 LAB — BASIC METABOLIC PANEL
Anion gap: 11 (ref 5–15)
BUN: 9 mg/dL (ref 6–20)
CALCIUM: 9.2 mg/dL (ref 8.9–10.3)
CHLORIDE: 96 mmol/L — AB (ref 101–111)
CO2: 25 mmol/L (ref 22–32)
CREATININE: 0.73 mg/dL (ref 0.61–1.24)
GFR calc Af Amer: >60 mL/min (ref >60)
GFR calc non Af Amer: >60 mL/min (ref >60)
Glucose, Bld: 69 mg/dL (ref 65–99)
POTASSIUM: 3.9 mmol/L (ref 3.5–5.1)
Sodium: 132 mmol/L — ABNORMAL LOW (ref 135–145)

## 2015-03-07 LAB — GLUCOSE, CAPILLARY
GLUCOSE-CAPILLARY: 144 mg/dL — AB (ref 65–99)
GLUCOSE-CAPILLARY: 131 mg/dL — AB (ref 65–99)
GLUCOSE-CAPILLARY: 118 mg/dL — AB (ref 65–99)
Glucose-Capillary: 82 mg/dL (ref 65–99)

## 2015-03-07 NOTE — Progress Notes (Signed)
Rehab admissions - I am following pt's case and met with pt/wife to share that we are now waiting on insurance authorization from both [employer] and [employer].  I am not sure when I receive a determination but will share updates when available.  Thanks.  Nanetta Batty, PT Rehabilitation Admissions Coordinator [phone]

## 2015-03-07 NOTE — PMR Pre-admission (Signed)
PMR Admission Coordinator Pre-Admission Assessment  Patient: Eric Price is an 64 y.o., male MRN: [id number] DOB: [birth year] Height: 5' 9"  (175.3 cm) Weight: 101.152 kg (223 lb)              Insurance Information HMO:     PPO:      PCP:      IPA:      80/20:      OTHER: [employer] PRIMARY: [employer]      Policy#: [id number]      Subscriber: self CM Name: Eric Jew, RN      Phone#: [phone]     Fax#: [phone] Approval given on 03-07-15 for 5-7 days. Follow up will be with onsite reviewer Eric Patten, RN (phone: [phone])  Pre-Cert#: [id number]      Employer: retired Benefits:  Phone #: [phone]     Name: per [place]. Date: 10-05-14     Deduct: none      Out of Pocket Max: $500 (met all)      Life Max: none CIR: 80/20%, pre-auth needed      SNF: 80/20%, 60 days visit limits Outpatient: 100%     Co-Pay: $20 copay, 23 visit limit for each PT/OT; 30 visit limits for [place]: 80/20%      Co-Pay: none, no visit limits DME: 80/20%     Co-Pay: none, no visit limits Providers: in network  SECONDARY: [place]      Policy#: [id number]      Subscriber: pt's wife Eric Price CM Name: Eric Price      Phone#: [phone]     Fax#: [phone] Approval given on 03-07-15 for 03-08-15 through 03-18-15. Updates due to Eric Price on 03-18-15 if pt is not being discharged that day. Pre-Cert#: [id number]      Employer: [employer] Benefits:  Phone #: [phone]     Name: Eric Price. Date: 10-05-14     Deduct: $7 Indiv. Deductible (none met)      Out of Pocket Max: $3210 OOP towards co-insurance (none met)      Life Max: none CIR: 80/20% with $233 copay, pre-auth needed      SNF: 80/20%, no visit limits Outpatient: 80/20%     Co-Pay: $70 copay, no visit limits Home Health: 80/20%      Co-Pay: none DME: 80/20%     Co-Pay: none  Emergency Contact Information Contact Information    Name Relation Home Work Mobile   Eric Price Spouse    [phone]   Eric Price Sister [phone]  [phone]     Current Medical History  Patient Admitting Diagnosis: Left BKA  History of Present Illness: Eric Price is a 64 y.o. right handed male with history of hypertension, CAD maintained on Eliquis, diabetes mellitus and peripheral neuropathy. Independent with a cane prior to admission living with his wife. Presented 03/05/2015 with ischemic left foot secondary to peripheral vascular disease with recent left great toe amputation 02/22/2015. Noted progressive gangrenous changes of left foot and limb was not felt to be salvageable. 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 Oneida Alar. Hospital course pain management. Eliquis resumed postoperatively. Physical and occupational therapy evaluations are pending. M.D. has requested physical medicine rehabilitation consult.    Past Medical History  Past Medical History  Diagnosis Date  . Depression   . Hyperlipidemia   . Hypertension   . Critical lower limb ischemia   . Sleep apnea     10-20 yrs. ago, states he used CPAP, not  needed anymore.   . Coronary artery disease   . Shortness of breath dyspnea     related to pain currently  . Peripheral vascular disease   . Pneumonia 2013    hosp. - [facility] x1 week   . Type II diabetes mellitus   . Arthritis     knees, shoulder, hands   . 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     Family History  family history includes CAD (age of onset: 32) in his father; CAD (age of onset: 46) in his paternal grandfather; Diabetes in his maternal grandmother; Heart failure (age of onset: 27) in his mother; Hypertension in his father. There is no history of Colon cancer, Esophageal cancer, Stomach cancer, or Rectal cancer.  Prior Rehab/Hospitalizations:  Has the patient had major surgery during 100 days prior to admission? Yes (pt had previous L great toe surgery  approximately 3 weeks prior to this admit  around 02-21-15. Pt's symptoms kept progressing following this great toe surgery.)  Current Medications   Current facility-administered medications:  .  acetaminophen (TYLENOL) tablet 325-650 mg, 325-650 mg, Oral, Q4H PRN **OR** acetaminophen (TYLENOL) suppository 325-650 mg, 325-650 mg, Rectal, Q4H PRN, Samantha J Rhyne, PA-C .  alum & mag hydroxide-simeth (MAALOX/MYLANTA) 200-200-20 MG/5ML suspension 15-30 mL, 15-30 mL, Oral, Q2H PRN, Samantha J Rhyne, PA-C .  apixaban (ELIQUIS) tablet 5 mg, 5 mg, Oral, BID, Samantha J Rhyne, PA-C, 5 mg at 03/08/15 1002 .  atorvastatin (LIPITOR) tablet 40 mg, 40 mg, Oral, BID, Samantha J Rhyne, PA-C, 40 mg at 03/08/15 1002 .  bisacodyl (DULCOLAX) suppository 10 mg, 10 mg, Rectal, Daily PRN, Samantha J Rhyne, PA-C .  buPROPion (WELLBUTRIN XL) 24 hr tablet 150 mg, 150 mg, Oral, Daily, Samantha J Rhyne, PA-C, 150 mg at 03/08/15 1000 .  carvedilol (COREG) tablet 3.125 mg, 3.125 mg, Oral, BID WC, Samantha J Rhyne, PA-C, 3.125 mg at 03/08/15 1001 .  clobetasol cream (TEMOVATE) 0.05 %, , Topical, BID, Samantha J Rhyne, PA-C .  cyclobenzaprine (FLEXERIL) tablet 10 mg, 10 mg, Oral, TID PRN, Hulen Shouts Rhyne, PA-C, 10 mg at 03/06/15 2215 .  docusate sodium (COLACE) capsule 100 mg, 100 mg, Oral, Daily, Samantha J Rhyne, PA-C, 100 mg at 03/08/15 1002 .  feeding supplement (PRO-STAT SUGAR FREE 64) liquid 30 mL, 30 mL, Oral, BID, Samantha J Rhyne, PA-C, 30 mL at 03/08/15 1002 .  fenofibrate tablet 54 mg, 54 mg, Oral, Daily, Samantha J Rhyne, PA-C, 54 mg at 03/08/15 1002 .  gabapentin (NEURONTIN) capsule 300 mg, 300 mg, Oral, BID, Samantha J Rhyne, PA-C, 300 mg at 03/08/15 1002 .  glyBURIDE (DIABETA) tablet 5 mg, 5 mg, Oral, BID WC, Samantha J Rhyne, PA-C, 5 mg at 03/08/15 0656 .  guaiFENesin-dextromethorphan (ROBITUSSIN DM) 100-10 MG/5ML syrup 15 mL, 15 mL, Oral, Q4H PRN, Samantha J Rhyne, PA-C .  hydrALAZINE (APRESOLINE) injection 5 mg, 5 mg, Intravenous, Q20 Min PRN,  [employer], PA-C .  HYDROmorphone (DILAUDID) injection 0.5-1 mg, 0.5-1 mg, Intravenous, Q2H PRN, Samantha J Rhyne, PA-C, 1 mg at 03/06/15 1123 .  hydrOXYzine (ATARAX/VISTARIL) tablet 10 mg, 10 mg, Oral, TID PRN, Samantha J Rhyne, PA-C .  insulin aspart (novoLOG) injection 0-15 Units, 0-15 Units, Subcutaneous, TID WC, Samantha J Rhyne, PA-C, 2 Units at 03/07/15 1718 .  labetalol (NORMODYNE,TRANDATE) injection 10 mg, 10 mg, Intravenous, Q10 min PRN, Samantha J Rhyne, PA-C .  metFORMIN (GLUCOPHAGE) tablet 1,000 mg, 1,000 mg, Oral, BID  WC, Samantha J Rhyne, PA-C, 1,000 mg at 03/08/15 0656 .  metoprolol (LOPRESSOR) injection 2-5 mg, 2-5 mg, Intravenous, Q2H PRN, Samantha J Rhyne, PA-C .  nitroGLYCERIN (NITROSTAT) SL tablet 0.4 mg, 0.4 mg, Sublingual, Q5 min PRN, Samantha J Rhyne, PA-C .  ondansetron (ZOFRAN) injection 4 mg, 4 mg, Intravenous, Q6H PRN, Samantha J Rhyne, PA-C .  oxyCODONE-acetaminophen (PERCOCET/ROXICET) 5-325 MG per tablet 1-2 tablet, 1-2 tablet, Oral, Q4H PRN, 1 tablet at 03/07/15 2000 **AND** oxyCODONE (Oxy IR/ROXICODONE) immediate release tablet 5-10 mg, 5-10 mg, Oral, Q4H PRN, [name], MD, 10 mg at 03/08/15 1001 .  pantoprazole (PROTONIX) EC tablet 40 mg, 40 mg, Oral, Daily, Samantha J Rhyne, PA-C, 40 mg at 03/08/15 1000 .  phenol (CHLORASEPTIC) mouth spray 1 spray, 1 spray, Mouth/Throat, PRN, Samantha J Rhyne, PA-C .  potassium chloride SA (K-DUR,KLOR-CON) CR tablet 20-40 mEq, 20-40 mEq, Oral, Daily PRN, Samantha J Rhyne, PA-C .  senna-docusate (Senokot-S) tablet 1 tablet, 1 tablet, Oral, QHS PRN, Samantha J Rhyne, PA-C .  sertraline (ZOLOFT) tablet 100 mg, 100 mg, Oral, Daily, Samantha J Rhyne, PA-C, 100 mg at 03/08/15 1002 .  tamsulosin (FLOMAX) capsule 0.4 mg, 0.4 mg, Oral, Daily, Samantha J Rhyne, PA-C, 0.4 mg at 03/08/15 1002 .  triamcinolone cream (KENALOG) 0.1 % 1 application, 1 application, Topical, Daily PRN, Hulen Shouts Rhyne, PA-C  Patients Current Diet:  Diet Carb Modified Fluid consistency:: Thin; Room service appropriate?: Yes  Precautions / Restrictions Precautions Precautions: Fall Precaution Comments: Discussed positioning with no pillow under knee.  Restrictions Weight Bearing Restrictions: Yes LLE Weight Bearing: Non weight bearing   Has the patient had 2 or more falls or a fall with injury in the past year?No  Prior Activity Level Limited Community (1-2x/wk): Pt has had limited mobility since his L toe sx for the past three weeks and was getting around with crutches or walker. Prior to toe surgery on 02-21-15, pt was independent and driving. 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   Home Assistive Devices / Equipment Home Assistive Devices/Equipment: Crutches, Environmental consultant (specify type), Eyeglasses, Dentures (specify type), CBG Meter (partial x2 ) Home Equipment: Walker - 2 wheels  Prior Device Use: Indicate devices/aids used by the patient prior to current illness, exacerbation or injury? Walker and single point cane (pt needed to use these following his L great toe surgery on 02-21-15. Per wife, he was "hobbling around" and needed to use either the crutches/cane/walker.)  Prior Functional Level Prior Function Level of Independence: Needs assistance Gait / Transfers Assistance Needed: Using the RW ADL's / Homemaking Assistance Needed: Wife assisting with ADLs  Self Care: Did the patient need help bathing, dressing, using the toilet or eating?  Independent  Indoor Mobility: Did the patient need assistance with walking from room to room (with or without device)? Independent but he was definitely "wobbling" per wife. He did not need physical assistance though.  Stairs: Did the patient need assistance with internal or external stairs (with or without device)? Needed some help  Functional Cognition: Did the patient need help planning regular tasks such as shopping or remembering to take medications?  Independent  Current Functional Level Cognition  Overall Cognitive Status: Within Functional Limits for tasks assessed Orientation Level: Oriented to person, Oriented to place, Disoriented to time, Disoriented to situation    Extremity Assessment (includes Sensation/Coordination)  Upper Extremity Assessment: Overall WFL for tasks assessed  Lower Extremity Assessment: Defer to PT evaluation LLE Deficits / Details: Decreased strength and  AROM consistent with recent BKA.     ADLs  Overall ADL's : Needs assistance/impaired Grooming: Set up, Sitting Upper Body Dressing : Set up, Sitting Lower Body Dressing: Minimal assistance, Sit to/from stand Toilet Transfer: Min guard, Minimal assistance, Stand-pivot, Ambulation, RW, Moderate assistance (bed/chair; Mod assist to stand from bed;) Functional mobility during ADLs: Minimal assistance, Min guard, Rolling walker (Min guard-stand pivot; Min assist for ambulation) General ADL Comments: Educated on desensitization techniques for LLE. Educated on LB dressing technique.  Discussed options for shower chair and two uses of 3 in 1.     Mobility  Overal bed mobility: Needs Assistance Bed Mobility: Rolling, Sidelying to Sit Rolling: Min guard Sidelying to sit: Min guard Supine to sit: Min assist General bed mobility comments: Pt was able to transition to EOB with heavy use of bed rails for support and increased time. Min guard for trunk support as pt elevated to full sitting position.     Transfers  Overall transfer level: Needs assistance Equipment used: Rolling walker (2 wheeled) Transfers: Sit to/from Stand Sit to Stand: Min guard, Min assist Stand pivot transfers: Min guard General transfer comment: Initially min assist provided for power-up to full stand. On following attempts pt required min guard assist to achieve full standing. VC's for hand placement on seated surface for safety.     Ambulation / Gait / Stairs / Wheelchair Mobility   Ambulation/Gait Ambulation/Gait assistance: Min guard, Min assist Ambulation Distance (Feet): 75 Feet Assistive device: Rolling walker (2 wheeled) General Gait Details: 1 seated rest break required and chair follow utilized. Occasional min assist was provided for unsteadiness. Gait Pattern/deviations: Decreased stride length, Trunk flexed, Step-to pattern Gait velocity: Decreased Gait velocity interpretation: Below normal speed for age/gender    Posture / Balance Balance Overall balance assessment: Needs assistance Sitting-balance support: Feet supported, No upper extremity supported Sitting balance-Leahy Scale: Fair Standing balance support: Bilateral upper extremity supported, During functional activity Standing balance-Leahy Scale: Poor Standing balance comment: Requires UE support for standing balance.     Special needs/care consideration BiPAP/CPAP no CPM no  Continuous Drip IV no  Dialysis no          Life Vest no  Oxygen no  Special Bed no  Trach Size no  Wound Vac (area) no        Skin - current healing L BKA surgical site. Note, pt also has history of skin cancer and received treatment three years [facility] at [facility]. Pt can get skin nodules all over.                               Bowel mgmt: last BM on 03-07-15 Bladder mgmt: using urinal Diabetic mgmt - managed at home with insulin/medications    Previous Home Environment Living Arrangements: Spouse/significant other Available Help at Discharge: Family, Available 24 hours/day (wife can't do a lot of physical assistance. She has medical issues herself but can provide supervision and possibly light physical help if needed.) Type of Home: House Home Layout: Two level, Able to live on main level with bedroom/bathroom Home Access: Stairs to enter Entrance Stairs-Rails: Right, Left Entrance Stairs-Number of Steps: 5 Bathroom Shower/Tub: Walk-in shower, Curtain Bathroom Toilet: Handicapped height Home Care Services: No  Discharge  Living Setting Plans for Discharge Living Setting: Patient's home Type of Home at Discharge: House Discharge Home Layout: Two level, Able to live on main level with bedroom/bathroom Alternate Level Stairs-Number of Steps: flight  of steps Discharge Home Access: Stairs to enter Entrance Stairs-Rails: Right, Left Entrance Stairs-Number of Steps: 5 Discharge Bathroom Shower/Tub: Walk-in shower Discharge Bathroom Toilet: Handicapped height Does the patient have any problems obtaining your medications?: No  Social/Family/Support Systems Patient Roles: Spouse, Other (Comment) (retired Horticulturist, 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 Information: wife [name] is primary contact Anticipated Caregiver: wife Anticipated Ambulance person Information: see above Ability/Limitations of Caregiver: pt's wife has physical limitations and cannot provide physical assistance Caregiver Availability: 24/7 Discharge Plan Discussed with Primary Caregiver: Yes Is Caregiver In Agreement with Plan?: Yes Does Caregiver/Family have Issues with Lodging/Transportation while Pt is in Rehab?: No  Goals/Additional Needs Patient/Family Goal for Rehab: Mod Ind with PT/OT; NA for SLP Expected length of stay: 7 days Cultural Considerations: [employer] Dietary Needs: carb modified, thin liquids Equipment Needs: to be determined Pt/Family Agrees to Admission and willing to participate: Yes (have spoken with pt's wife several times) Program Orientation Provided & Reviewed with Pt/Caregiver Including Roles  & Responsibilities: Yes   Decrease burden of Care through IP rehab admission: NA   Possible need for SNF placement upon discharge: not anticipated   Patient Condition: This patient's medical and functional status has changed since the consult dated: 03-06-15 in which the Rehabilitation Physician determined and documented that the patient's condition is appropriate for intensive rehabilitative care in an inpatient  rehabilitation facility. See "History of Present Illness" (above) for medical update. Functional changes are: minimal to min guard assist with mobility and min to moderate assist with self care tasks. Patient's medical and functional status update has been discussed with the Rehabilitation physician and patient remains appropriate for inpatient rehabilitation. Will admit to inpatient rehab today.  Preadmission Screen Completed By:  Nanetta Batty, PT, 03/08/2015 11:08 AM ______________________________________________________________________   Discussed status with Dr. Naaman Plummer on 03-08-15 at 1128 and received telephone approval for admission today.  Admission Coordinator:  Nanetta Batty, [place], time1128/Date 03-08-15

## 2015-03-07 NOTE — Discharge Summary (Signed)
[facility] Discharge Summary  Avigdor Schexnayder 01/23/1951 64 y.o. male  [id number]  Admission Date: 03/05/2015  Discharge Date: 03/08/2015  Physician: Charles E Fields, MD  Admission Diagnosis: Ischemic left foot M62.89  HPI:   This is a 64 y.o. male who underwent angiography by Dr. Berry for a wound on his left great toe on 02/21/15. 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 The anterior tibial and peroneal were occluded proximally and the posterior tibial was occluded near the ankle with poor perfusion of the foot. His coronary angiogram revealed a decreased EF with moderate global hypokinesis. There was possible LV thrombus. He states this has been going on g=for approximately 6 weeks. His left great toe is very painful with no relieving factors. His left 5th toe is now painful.  The patient is a type 2 diabetic with recent Hb A1c of 8.6. He complains of mild exertional chest pain and had mildly elevated cardiac enzymes. He is medically managed for hypertension and takes a statin for hypercholesterolemia. 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  Hospital Course:  The patient was admitted to the hospital and taken to the operating room on 03/05/2015 and underwent: left below-knee amputation.   The patient tolerated the procedure well and was transported to the PACU in stable condition.   The patient did well post-operatively. His incision was healing well and stump viable. His Eliquis was restarted on POD 1. He was evaluated by inpatient rehabilitation for admission. His pain was adequately controlled. He was discharged to CIR on POD 3 in good condition.     CBC    Component Value Date/Time   WBC 11.9* 03/07/2015 0510   WBC 9.0 08/10/2013 1443   RBC 4.09* 03/07/2015 0510   RBC 5.70 08/10/2013 1443   HGB 11.2* 03/07/2015 0510   HGB 15.4 08/10/2013 1443   HCT 33.7* 03/07/2015 0510   HCT 46.7 08/10/2013 1443   PLT 446* 03/07/2015 0510   PLT 252 08/10/2013 1443   MCV 82.4 03/07/2015 0510   MCV 82.0 08/10/2013 1443   MCH 27.4 03/07/2015 0510   MCH 27.0* 08/10/2013 1443   MCHC 33.2 03/07/2015 0510   MCHC 33.0 08/10/2013 1443   RDW 12.7 03/07/2015 0510   RDW 12.6 08/10/2013 1443   LYMPHSABS 2.5 08/21/2014 1545   LYMPHSABS 2.1 08/10/2013 1443   MONOABS 0.7 08/21/2014 1545   MONOABS 0.6 08/10/2013 1443   EOSABS 0.1 08/21/2014 1545   EOSABS 0.1 08/10/2013 1443   BASOSABS 0.0 08/21/2014 1545   BASOSABS 0.0 08/10/2013 1443    BMET    Component Value Date/Time   NA 132* 03/07/2015 0510   NA 136 08/10/2013 1443   K 3.9 03/07/2015 0510   K 4.1 08/10/2013 1443   CL 96* 03/07/2015 0510   CL 103 11/18/2012 1458   CO2 25 03/07/2015 0510   CO2 20* 08/10/2013 1443   GLUCOSE 69 03/07/2015 0510   GLUCOSE 255* 08/10/2013 1443   GLUCOSE 293* 11/18/2012 1458   BUN 9 03/07/2015 0510   BUN 11.3 08/10/2013 1443   CREATININE 0.73 03/07/2015 0510   CREATININE 0.99 02/19/2015 1535   CREATININE 1.0 08/10/2013 1443   CALCIUM 9.2 03/07/2015 0510   CALCIUM 9.4 08/10/2013 1443   GFRNONAA >60 03/07/2015 0510   GFRAA >60 03/07/2015 0510     Discharge Instructions:   The patient is discharged to CIR with extensive instructions on wound care and progressive ambulation.  They are instructed   not to drive or perform any heavy lifting until returning to see the physician in his office.   Discharge Diagnosis:  Ischemic left foot M62.89  Secondary Diagnosis: Patient Active Problem List   Diagnosis Date Noted  . Ischemic toe 03/05/2015  . Pain in joint, ankle and foot 02/28/2015  . Wound drainage-Left foot 02/28/2015  . Cold sensation of skin-Left foot 02/28/2015  . Non-ischemic cardiomyopathy: EF ~30-25% 02/22/2015  . Coronary artery disease, non-occlusive: mod LAD, ~60-70& dRCA 02/22/2015  . Embolic disease of toe 02/22/2015  . Ischemic ulcer of toe of left foot 02/22/2015  . Diabetic foot ulcer  02/21/2015  . Critical lower limb ischemia 02/19/2015  . Poorly controlled type 2 diabetes mellitus 09/19/2014  . Hereditary and idiopathic peripheral neuropathy 09/07/2014  . 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 Subacromial bursitis 06/19/2014  . Primary localized osteoarthrosis, lower leg 06/19/2014  . Low back pain 05/28/2014  . Acromioclavicular joint arthritis 05/28/2014  . Dental decay 05/25/2014  . Major depressive disorder, recurrent episode 12/11/2013  . Obesity (BMI 30-39.9) 10/31/2013  . Chest pain 10/20/2013  . Stable angina 10/19/2013  . Diabetes mellitus type 2, uncontrolled 10/19/2013  . BPH (benign prostatic hyperplasia) 10/11/2013  . Dyspnea 06/30/2013  . Mycosis fungoides   . Type 2 diabetes mellitus 07/13/2012  . History of depression 07/13/2012  . Essential hypertension 07/13/2012  . Hyperlipidemia 07/13/2012   Past Medical History  Diagnosis Date  . Depression   . Hyperlipidemia   . Hypertension   . Critical lower limb ischemia   . Sleep apnea     10-20 yrs. ago, states he used CPAP, not needed anymore.   . Coronary artery disease   . Shortness of breath dyspnea     related to pain currently  . Peripheral vascular disease   . Pneumonia 2013    hosp. - M[facility] x1 week   . Type II diabetes mellitus   . Arthritis     knees, shoulder, hands   . 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        Medication List    ASK your doctor about these medications        acitretin 25 MG capsule  Commonly known as:  SORIATANE  Take one by mouth on Monday, Wednesday and Friday.     apixaban 5 MG Tabs tablet  Commonly known as:  ELIQUIS  Take 1 tablet (5 mg total) by mouth 2 (two) times daily.     atorvastatin 40 MG tablet  Commonly known as:  LIPITOR  Take 40 mg by mouth 2 (two) times daily.     buPROPion 150 MG 24 hr tablet  Commonly known as:  WELLBUTRIN XL  TAKE 1 TABLET BY MOUTH DAILY     carvedilol 3.125 MG tablet    Commonly known as:  COREG  Take 1 tablet (3.125 mg total) by mouth 2 (two) times daily with a meal.     clobetasol cream 0.05 %  Commonly known as:  TEMOVATE  APPLY 1 APPLICATION TOPICALLY TWICE DAILY     cyclobenzaprine 10 MG tablet  Commonly known as:  FLEXERIL  Take 1 tablet (10 mg total) by mouth 3 (three) times daily as needed for muscle spasms.     feeding supplement (PRO-STAT SUGAR FREE 64) Liqd  Take 30 mLs by mouth 2 (two) times daily.     fenofibrate 54 MG tablet  Take 54 mg by mouth daily. Pt. Not   instructed to take currently     gabapentin 300 MG capsule  Commonly known as:  NEURONTIN  Take 300 mg by mouth 2 (two) times daily.     glucose blood test strip  Commonly known as:  ONE TOUCH TEST STRIPS  CHECK 2 TIMES DAILY. ONE TOUCH ULTRA TEST STRIPS. DX:250.00     glyBURIDE 5 MG tablet  Commonly known as:  DIABETA  TAKE 1 TABLET BY MOUTH TWICE DAILY WITH A MEAL     hydrOXYzine 10 MG tablet  Commonly known as:  ATARAX/VISTARIL  Take 10 mg by mouth 3 (three) times daily as needed for itching.     Insulin Detemir 100 UNIT/ML Pen  Commonly known as:  LEVEMIR FLEXTOUCH  30 units at bedtime     insulin lispro 100 UNIT/ML KiwkPen  Commonly known as:  HUMALOG  Inject 0.05 mLs (5 Units total) into the skin 3 (three) times daily.     losartan 50 MG tablet  Commonly known as:  COZAAR  Take 1 tablet (50 mg total) by mouth daily.     metFORMIN 500 MG tablet  Commonly known as:  GLUCOPHAGE  TAKE 2 TABLETS BY MOUTH TWICE DAILY WITH MEALS     nitroGLYCERIN 0.4 MG SL tablet  Commonly known as:  NITROSTAT  Place 1 tablet (0.4 mg total) under the tongue every 5 (five) minutes as needed for chest pain.     ONE TOUCH LANCETS Misc  Check 2 times daily.     oxyCODONE-acetaminophen 10-325 MG per tablet  Commonly known as:  PERCOCET  Take 1-2 tablets by mouth every 4 (four) hours as needed for pain.     sertraline 100 MG tablet  Commonly known as:  ZOLOFT  TAKE 1  TABLET BY MOUTH EVERY DAY     tamsulosin 0.4 MG Caps capsule  Commonly known as:  FLOMAX  TAKE 1 CAPSULE BY MOUTH DAILY     triamcinolone cream 0.1 %  Commonly known as:  KENALOG  Apply 1 application topically daily as needed (affected areas).        Disposition: CIR  Patient's condition: is Good  Follow up: 1. Dr. Fields in 4 weeks    , PA-C [facility] [phone] 03/07/2015  8:42 AM     

## 2015-03-07 NOTE — Progress Notes (Signed)
[facility] [place]  Subjective  - still some pain   Objective 132/89 106 98.8 F (37.1 C) (Oral) 20 98%  Intake/Output Summary (Last 24 hours) at 03/07/15 0901 Last data filed at 03/07/15 6606  Gross per 24 hour  Intake    720 ml  Output    650 ml  Net     70 ml   Left BKA incision healing no hematoma or erythema  Assessment/Planning: BKA cont to heal OK for Rehab Avoid IV pain meds to avoid confusion  Ruta Hinds 03/07/2015 9:01 AM --  Laboratory Lab Results:  Recent Labs  03/06/15 0453 03/07/15 0510  WBC 10.9* 11.9*  HGB 11.0* 11.2*  HCT 32.8* 33.7*  PLT 413* 446*   BMET  Recent Labs  03/06/15 0453 03/07/15 0510  NA 133* 132*  K 4.1 3.9  CL 98* 96*  CO2 25 25  GLUCOSE 137* 69  BUN 9 9  CREATININE 0.78 0.73  CALCIUM 9.0 9.2    COAG Lab Results  Component Value Date   INR 0.96 02/19/2015   INR 1.02 10/20/2013   INR 0.92 11/30/2012   No results found for: PTT

## 2015-03-08 ENCOUNTER — Inpatient Hospital Stay (HOSPITAL_COMMUNITY)
Admission: RE | Admit: 2015-03-08 | Discharge: 2015-03-16 | DRG: 945 | Disposition: A | Discharge status: Home Health | Payer: 59 | Source: Intra-hospital | Attending: Physical Medicine & Rehabilitation | Admitting: Physical Medicine & Rehabilitation

## 2015-03-08 DIAGNOSIS — R12 Heartburn: Secondary | ICD-10-CM | POA: Diagnosis present

## 2015-03-08 DIAGNOSIS — R413 Other amnesia: Secondary | ICD-10-CM | POA: Insufficient documentation

## 2015-03-08 DIAGNOSIS — E785 Hyperlipidemia, unspecified: Secondary | ICD-10-CM | POA: Diagnosis present

## 2015-03-08 DIAGNOSIS — R531 Weakness: Secondary | ICD-10-CM | POA: Diagnosis present

## 2015-03-08 DIAGNOSIS — IMO0002 Reserved for concepts with insufficient information to code with codable children: Secondary | ICD-10-CM | POA: Diagnosis present

## 2015-03-08 DIAGNOSIS — N400 Enlarged prostate without lower urinary tract symptoms: Secondary | ICD-10-CM | POA: Diagnosis present

## 2015-03-08 DIAGNOSIS — G546 Phantom limb syndrome with pain: Secondary | ICD-10-CM | POA: Diagnosis present

## 2015-03-08 DIAGNOSIS — I4891 Unspecified atrial fibrillation: Secondary | ICD-10-CM | POA: Diagnosis present

## 2015-03-08 DIAGNOSIS — I739 Peripheral vascular disease, unspecified: Secondary | ICD-10-CM | POA: Diagnosis present

## 2015-03-08 DIAGNOSIS — F329 Major depressive disorder, single episode, unspecified: Secondary | ICD-10-CM | POA: Diagnosis present

## 2015-03-08 DIAGNOSIS — D62 Acute posthemorrhagic anemia: Secondary | ICD-10-CM | POA: Diagnosis present

## 2015-03-08 DIAGNOSIS — E1165 Type 2 diabetes mellitus with hyperglycemia: Secondary | ICD-10-CM | POA: Diagnosis not present

## 2015-03-08 DIAGNOSIS — G4700 Insomnia, unspecified: Secondary | ICD-10-CM | POA: Diagnosis present

## 2015-03-08 DIAGNOSIS — E1142 Type 2 diabetes mellitus with diabetic polyneuropathy: Secondary | ICD-10-CM | POA: Diagnosis present

## 2015-03-08 DIAGNOSIS — N401 Enlarged prostate with lower urinary tract symptoms: Secondary | ICD-10-CM | POA: Diagnosis present

## 2015-03-08 DIAGNOSIS — I10 Essential (primary) hypertension: Secondary | ICD-10-CM | POA: Diagnosis present

## 2015-03-08 DIAGNOSIS — K5900 Constipation, unspecified: Secondary | ICD-10-CM | POA: Diagnosis present

## 2015-03-08 DIAGNOSIS — R3915 Urgency of urination: Secondary | ICD-10-CM | POA: Diagnosis present

## 2015-03-08 DIAGNOSIS — I2510 Atherosclerotic heart disease of native coronary artery without angina pectoris: Secondary | ICD-10-CM | POA: Diagnosis present

## 2015-03-08 DIAGNOSIS — Z89512 Acquired absence of left leg below knee: Secondary | ICD-10-CM

## 2015-03-08 LAB — GLUCOSE, CAPILLARY
GLUCOSE-CAPILLARY: 104 mg/dL — AB (ref 65–99)
GLUCOSE-CAPILLARY: 83 mg/dL (ref 65–99)
Glucose-Capillary: 172 mg/dL — ABNORMAL HIGH (ref 65–99)
Glucose-Capillary: 98 mg/dL (ref 65–99)

## 2015-03-08 MED ORDER — ONDANSETRON HCL 4 MG PO TABS: 4.0000 mg | ORAL_TABLET | Freq: Four times a day (QID) | ORAL | Status: DC | PRN

## 2015-03-08 MED ORDER — GABAPENTIN 300 MG PO CAPS
300.0000 mg | ORAL_CAPSULE | Freq: Two times a day (BID) | ORAL | Status: DC
  Administered 2015-03-08 – 2015-03-11 (×7): 300 mg via ORAL
  Filled 2015-03-08 (×10): qty 1

## 2015-03-08 MED ORDER — NITROGLYCERIN 0.4 MG SL SUBL: 0.4000 mg | SUBLINGUAL_TABLET | SUBLINGUAL | Status: DC | PRN

## 2015-03-08 MED ORDER — CLOBETASOL PROPIONATE 0.05 % EX CREA
TOPICAL_CREAM | Freq: Two times a day (BID) | CUTANEOUS | Status: DC
  Administered 2015-03-08 – 2015-03-14 (×12): via TOPICAL
  Administered 2015-03-14: 1 via TOPICAL
  Administered 2015-03-15 – 2015-03-16 (×3): via TOPICAL
  Filled 2015-03-08: qty 15

## 2015-03-08 MED ORDER — GLYBURIDE 5 MG PO TABS
5.0000 mg | ORAL_TABLET | Freq: Two times a day (BID) | ORAL | Status: DC
  Administered 2015-03-09 – 2015-03-16 (×15): 5 mg via ORAL
  Filled 2015-03-08 (×17): qty 1

## 2015-03-08 MED ORDER — INSULIN ASPART 100 UNIT/ML ~~LOC~~ SOLN
0.0000 [IU] | Freq: Three times a day (TID) | SUBCUTANEOUS | Status: DC
  Administered 2015-03-09 (×2): 3 [IU] via SUBCUTANEOUS
  Administered 2015-03-10 – 2015-03-11 (×4): 2 [IU] via SUBCUTANEOUS
  Administered 2015-03-12: 3 [IU] via SUBCUTANEOUS
  Administered 2015-03-12: 1 [IU] via SUBCUTANEOUS
  Administered 2015-03-12 – 2015-03-13 (×2): 2 [IU] via SUBCUTANEOUS
  Administered 2015-03-13: 8 [IU] via SUBCUTANEOUS
  Administered 2015-03-14 – 2015-03-15 (×2): 3 [IU] via SUBCUTANEOUS

## 2015-03-08 MED ORDER — PANTOPRAZOLE SODIUM 40 MG PO TBEC
40.0000 mg | DELAYED_RELEASE_TABLET | Freq: Every day | ORAL | Status: DC
  Administered 2015-03-09 – 2015-03-16 (×8): 40 mg via ORAL
  Filled 2015-03-08 (×7): qty 1

## 2015-03-08 MED ORDER — SORBITOL 70 % SOLN
30.0000 mL | Freq: Every day | Status: DC | PRN
  Administered 2015-03-14: 30 mL via ORAL
  Filled 2015-03-08: qty 30

## 2015-03-08 MED ORDER — OXYCODONE HCL 5 MG PO TABS
5.0000 mg | ORAL_TABLET | ORAL | Status: DC | PRN
  Administered 2015-03-08: 10 mg via ORAL
  Administered 2015-03-09 – 2015-03-12 (×12): 5 mg via ORAL
  Administered 2015-03-13 (×3): 10 mg via ORAL
  Administered 2015-03-13: 5 mg via ORAL
  Administered 2015-03-14 – 2015-03-15 (×4): 10 mg via ORAL
  Administered 2015-03-15 – 2015-03-16 (×3): 5 mg via ORAL
  Filled 2015-03-08: qty 2
  Filled 2015-03-08 (×2): qty 1
  Filled 2015-03-08: qty 2
  Filled 2015-03-08 (×2): qty 1
  Filled 2015-03-08: qty 2
  Filled 2015-03-08 (×10): qty 1
  Filled 2015-03-08: qty 2
  Filled 2015-03-08: qty 1
  Filled 2015-03-08 (×3): qty 2
  Filled 2015-03-08 (×2): qty 1
  Filled 2015-03-08: qty 2

## 2015-03-08 MED ORDER — TRIAMCINOLONE ACETONIDE 0.1 % EX CREA: 1.0000 "application " | TOPICAL_CREAM | Freq: Every day | CUTANEOUS | Status: DC | PRN

## 2015-03-08 MED ORDER — ATORVASTATIN CALCIUM 40 MG PO TABS
40.0000 mg | ORAL_TABLET | Freq: Two times a day (BID) | ORAL | Status: DC
  Administered 2015-03-08 – 2015-03-09 (×2): 40 mg via ORAL
  Filled 2015-03-08 (×4): qty 1

## 2015-03-08 MED ORDER — ONDANSETRON HCL 4 MG/2ML IJ SOLN: 4.0000 mg | Freq: Four times a day (QID) | INTRAMUSCULAR | Status: DC | PRN

## 2015-03-08 MED ORDER — CARVEDILOL 3.125 MG PO TABS
3.1250 mg | ORAL_TABLET | Freq: Two times a day (BID) | ORAL | Status: DC
  Administered 2015-03-09 – 2015-03-16 (×15): 3.125 mg via ORAL
  Filled 2015-03-08 (×18): qty 1

## 2015-03-08 MED ORDER — FENOFIBRATE 54 MG PO TABS
54.0000 mg | ORAL_TABLET | Freq: Every day | ORAL | Status: DC
  Administered 2015-03-09 – 2015-03-16 (×8): 54 mg via ORAL
  Filled 2015-03-08 (×9): qty 1

## 2015-03-08 MED ORDER — SERTRALINE HCL 100 MG PO TABS
100.0000 mg | ORAL_TABLET | Freq: Every day | ORAL | Status: DC
Start: 2015-03-09 — End: 2015-03-16
  Administered 2015-03-09 – 2015-03-16 (×8): 100 mg via ORAL
  Filled 2015-03-08 (×9): qty 1

## 2015-03-08 MED ORDER — OXYCODONE-ACETAMINOPHEN 5-325 MG PO TABS
1.0000 | ORAL_TABLET | ORAL | Status: DC | PRN
  Administered 2015-03-09 (×2): 1 via ORAL
  Administered 2015-03-09 (×2): 2 via ORAL
  Administered 2015-03-10 – 2015-03-13 (×11): 1 via ORAL
  Administered 2015-03-14 (×2): 2 via ORAL
  Administered 2015-03-15 (×2): 1 via ORAL
  Administered 2015-03-15: 2 via ORAL
  Administered 2015-03-15 – 2015-03-16 (×3): 1 via ORAL
  Filled 2015-03-08 (×4): qty 1
  Filled 2015-03-08: qty 2
  Filled 2015-03-08: qty 1
  Filled 2015-03-08: qty 2
  Filled 2015-03-08: qty 1
  Filled 2015-03-08: qty 2
  Filled 2015-03-08 (×7): qty 1
  Filled 2015-03-08: qty 2
  Filled 2015-03-08 (×2): qty 1
  Filled 2015-03-08: qty 2
  Filled 2015-03-08 (×3): qty 1
  Filled 2015-03-08: qty 2

## 2015-03-08 MED ORDER — METHOCARBAMOL 500 MG PO TABS
500.0000 mg | ORAL_TABLET | Freq: Four times a day (QID) | ORAL | Status: DC | PRN
  Administered 2015-03-08 – 2015-03-15 (×10): 500 mg via ORAL
  Filled 2015-03-08 (×10): qty 1

## 2015-03-08 MED ORDER — ACETAMINOPHEN 325 MG PO TABS: 325.0000 mg | ORAL_TABLET | ORAL | Status: DC | PRN

## 2015-03-08 MED ORDER — TAMSULOSIN HCL 0.4 MG PO CAPS
0.4000 mg | ORAL_CAPSULE | Freq: Every day | ORAL | Status: DC
  Administered 2015-03-09 – 2015-03-16 (×8): 0.4 mg via ORAL
  Filled 2015-03-08 (×9): qty 1

## 2015-03-08 MED ORDER — SENNOSIDES-DOCUSATE SODIUM 8.6-50 MG PO TABS
1.0000 | ORAL_TABLET | Freq: Every evening | ORAL | Status: DC | PRN
  Administered 2015-03-10: 1 via ORAL
  Filled 2015-03-08: qty 1

## 2015-03-08 MED ORDER — METFORMIN HCL 500 MG PO TABS
1000.0000 mg | ORAL_TABLET | Freq: Two times a day (BID) | ORAL | Status: DC
  Administered 2015-03-09 – 2015-03-16 (×15): 1000 mg via ORAL
  Filled 2015-03-08 (×17): qty 2

## 2015-03-08 MED ORDER — BUPROPION HCL ER (XL) 150 MG PO TB24
150.0000 mg | ORAL_TABLET | Freq: Every day | ORAL | Status: DC
  Administered 2015-03-09 – 2015-03-16 (×8): 150 mg via ORAL
  Filled 2015-03-08 (×9): qty 1

## 2015-03-08 MED ORDER — DOCUSATE SODIUM 100 MG PO CAPS
100.0000 mg | ORAL_CAPSULE | Freq: Every day | ORAL | Status: DC
  Administered 2015-03-09 – 2015-03-16 (×8): 100 mg via ORAL
  Filled 2015-03-08 (×9): qty 1

## 2015-03-08 MED ORDER — APIXABAN 5 MG PO TABS
5.0000 mg | ORAL_TABLET | Freq: Two times a day (BID) | ORAL | Status: DC
  Administered 2015-03-08 – 2015-03-16 (×16): 5 mg via ORAL
  Filled 2015-03-08 (×19): qty 1

## 2015-03-08 MED ORDER — ACETAMINOPHEN 650 MG RE SUPP: 325.0000 mg | RECTAL | Status: DC | PRN

## 2015-03-08 MED ORDER — BISACODYL 10 MG RE SUPP
10.0000 mg | Freq: Every day | RECTAL | Status: DC | PRN
  Administered 2015-03-08: 10 mg via RECTAL
  Filled 2015-03-08: qty 1

## 2015-03-08 NOTE — Progress Notes (Signed)
Rehab admissions - I have received insurance approval for inpatient rehab from both [employer] and [facility]. I have spoken with vascular PA and received medical clearance. We will admit pt to inpatient rehab today.  I called and updated pt by phone. He is pleased with the plan and will update his wife when she arrives. I will meet with them later this am to complete admission paperwork.  I called and updated Edwin Cap, case Freight forwarder and Randall Hiss, Education officer, museum.  Thanks.  Nanetta Batty, PT Rehabilitation Admissions Coordinator [phone]

## 2015-03-08 NOTE — Progress Notes (Signed)
Physical Therapy Treatment Patient Details Name: Eric Price MRN: [id number] DOB: 1951-03-17 Today's Date: 03/08/2015    History of Present Illness Pt is a 64 y.o. 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     PT Comments    Pt progressing well towards physical therapy goals. Was able to improve ambulation distance this session, however was visibly fatigued after gait training. Overall good rehab effort, and it appears that the plan is for transfer to CIR today. Will continue to follow.   Follow Up Recommendations  CIR     Equipment Recommendations  Wheelchair (measurements PT);Wheelchair cushion (measurements PT)    Recommendations for Other Services Rehab consult     Precautions / Restrictions Precautions Precautions: Fall Precaution Comments: Discussed positioning with no pillow under knee.  Restrictions Weight Bearing Restrictions: Yes LLE Weight Bearing: Non weight bearing    Mobility  Bed Mobility Overal bed mobility: Needs Assistance Bed Mobility: Rolling;Sidelying to Sit Rolling: Min guard Sidelying to sit: Min guard       General bed mobility comments: Pt was able to transition to EOB with heavy use of bed rails for support and increased time. Min guard for trunk support as pt elevated to full sitting position.   Transfers Overall transfer level: Needs assistance Equipment used: Rolling walker (2 wheeled) Transfers: Sit to/from Stand Sit to Stand: Min guard;Min assist         General transfer comment: Initially min assist provided for power-up to full stand. On following attempts pt required min guard assist to achieve full standing. VC's for hand placement on seated surface for safety.   Ambulation/Gait Ambulation/Gait assistance: Min guard;Min assist Ambulation Distance (Feet): 75 Feet Assistive device: Rolling walker (2 wheeled) Gait Pattern/deviations: Decreased stride length;Trunk flexed;Step-to pattern Gait  velocity: Decreased Gait velocity interpretation: Below normal speed for age/gender General Gait Details: 1 seated rest break required and chair follow utilized. Occasional min assist was provided for unsteadiness.   Stairs            Wheelchair Mobility    Modified Rankin (Stroke Patients Only)       Balance Overall balance assessment: Needs assistance Sitting-balance support: Feet supported;No upper extremity supported Sitting balance-Leahy Scale: Fair     Standing balance support: Bilateral upper extremity supported;During functional activity Standing balance-Leahy Scale: Poor                      Cognition Arousal/Alertness: Awake/alert Behavior During Therapy: WFL for tasks assessed/performed Overall Cognitive Status: Within Functional Limits for tasks assessed                      Exercises General Exercises - Lower Extremity Quad Sets: 10 reps Long Arc Quad: 10 reps Hip ABduction/ADduction: 10 reps Straight Leg Raises: 5 reps    General Comments        Pertinent Vitals/Pain Pain Assessment: Faces Faces Pain Scale: Hurts little more Pain Location: Lt residual limb Pain Descriptors / Indicators: Other (Comment) (Phantom pain) Pain Intervention(s): Limited activity within patient's tolerance;Monitored during session;Premedicated before session;Repositioned    Home Living                      Prior Function            PT Goals (current goals can now be found in the care plan section) Acute Rehab PT Goals Patient Stated Goal: not stated PT Goal  Formulation: With patient/family Time For Goal Achievement: 03/12/15 Potential to Achieve Goals: Good Progress towards PT goals: Progressing toward goals    Frequency  Min 3X/week    PT Plan Discharge plan needs to be updated    Co-evaluation             End of Session Equipment Utilized During Treatment: Gait belt Activity Tolerance: Patient tolerated treatment  well Patient left: in chair;with call bell/phone within reach;with family/visitor present     Time: 0830-0905 PT Time Calculation (min) (ACUTE ONLY): 35 min  Charges:  $Gait Training: 8-22 mins $Therapeutic Activity: 8-22 mins                    G Codes:      Rolinda Roan Mar 27, 2015, 9:43 AM   Rolinda Roan, PT, DPT Acute Rehabilitation Services Pager: [phone]

## 2015-03-08 NOTE — Progress Notes (Addendum)
  [facility] Progress Note  03/08/2015 9:01 AM 3 Days Post-Op  Subjective:  Patient with no complaints. Working with PT.    Filed Vitals:   03/08/15 0422  BP: 134/78  Pulse: 101  Temp: 98.1 F (36.7 C)  Resp: 20    Physical Exam: Left stump dressing clean and dry. Staple line clean and intact. Stump is soft and warm. No hematoma. Some mild swelling.   CBC    Component Value Date/Time   WBC 11.9* 03/07/2015 0510   WBC 9.0 08/10/2013 1443   RBC 4.09* 03/07/2015 0510   RBC 5.70 08/10/2013 1443   HGB 11.2* 03/07/2015 0510   HGB 15.4 08/10/2013 1443   HCT 33.7* 03/07/2015 0510   HCT 46.7 08/10/2013 1443   PLT 446* 03/07/2015 0510   PLT 252 08/10/2013 1443   MCV 82.4 03/07/2015 0510   MCV 82.0 08/10/2013 1443   MCH 27.4 03/07/2015 0510   MCH 27.0* 08/10/2013 1443   MCHC 33.2 03/07/2015 0510   MCHC 33.0 08/10/2013 1443   RDW 12.7 03/07/2015 0510   RDW 12.6 08/10/2013 1443   LYMPHSABS 2.5 08/21/2014 1545   LYMPHSABS 2.1 08/10/2013 1443   MONOABS 0.7 08/21/2014 1545   MONOABS 0.6 08/10/2013 1443   EOSABS 0.1 08/21/2014 1545   EOSABS 0.1 08/10/2013 1443   BASOSABS 0.0 08/21/2014 1545   BASOSABS 0.0 08/10/2013 1443    BMET    Component Value Date/Time   NA 132* 03/07/2015 0510   NA 136 08/10/2013 1443   K 3.9 03/07/2015 0510   K 4.1 08/10/2013 1443   CL 96* 03/07/2015 0510   CL 103 11/18/2012 1458   CO2 25 03/07/2015 0510   CO2 20* 08/10/2013 1443   GLUCOSE 69 03/07/2015 0510   GLUCOSE 255* 08/10/2013 1443   GLUCOSE 293* 11/18/2012 1458   BUN 9 03/07/2015 0510   BUN 11.3 08/10/2013 1443   CREATININE 0.73 03/07/2015 0510   CREATININE 0.99 02/19/2015 1535   CREATININE 1.0 08/10/2013 1443   CALCIUM 9.2 03/07/2015 0510   CALCIUM 9.4 08/10/2013 1443   GFRNONAA >60 03/07/2015 0510   GFRAA >60 03/07/2015 0510    INR    Component Value Date/Time   INR 0.96 02/19/2015 1535     Intake/Output Summary (Last 24 hours) at 03/08/15  0901 Last data filed at 03/08/15 0545  Gross per 24 hour  Intake   1200 ml  Output   1600 ml  Net   -400 ml     Assessment:  64 y.o. male is s/p: left BKA 3 Days Post-Op  Plan: Doing well post-operatively. Stump is viable. Insurance approved, to CIR today.    Virgina Jock, PA-C [facility] Office: [phone] Pager: [phone] 03/08/2015 9:01 AM   Agree with above BKA healing Rehab today  Ruta Hinds, MD [facility] [place] Office: [phone] Pager: [phone]

## 2015-03-08 NOTE — Progress Notes (Signed)
Report given to [facility] on [facility] room 12.

## 2015-03-08 NOTE — Interval H&P Note (Signed)
Eric Price was admitted today to Inpatient Rehabilitation with the diagnosis of left BKA.  The patient's history has been reviewed, patient examined, and there is no change in status.  Patient continues to be appropriate for intensive inpatient rehabilitation.  I have reviewed the patient's chart and labs.  Questions were answered to the patient's satisfaction. The PAPE has been reviewed and assessment remains appropriate.  Meredith Kilbride T 03/08/2015, 5:13 PM
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
## 2015-03-08 NOTE — Clinical Social Work Note (Signed)
CSW received confirmation from inpatient rehab that they have received insurance approval, and plan is to discharge to CIR, CSW to sign off, please reconsult if other social work needs arise.  Jones Broom. [place], MSW, [place] 03/08/2015 10:06 AM

## 2015-03-09 ENCOUNTER — Inpatient Hospital Stay (HOSPITAL_COMMUNITY): Payer: 59 | Admitting: Occupational Therapy

## 2015-03-09 ENCOUNTER — Inpatient Hospital Stay (HOSPITAL_COMMUNITY): Payer: 59 | Admitting: *Deleted

## 2015-03-09 DIAGNOSIS — Z89512 Acquired absence of left leg below knee: Secondary | ICD-10-CM

## 2015-03-09 LAB — GLUCOSE, CAPILLARY
GLUCOSE-CAPILLARY: 185 mg/dL — AB (ref 65–99)
Glucose-Capillary: 111 mg/dL — ABNORMAL HIGH (ref 65–99)
Glucose-Capillary: 167 mg/dL — ABNORMAL HIGH (ref 65–99)
Glucose-Capillary: 120 mg/dL — ABNORMAL HIGH (ref 65–99)

## 2015-03-09 MED ORDER — ATORVASTATIN CALCIUM 40 MG PO TABS
60.0000 mg | ORAL_TABLET | Freq: Every day | ORAL | Status: DC
  Administered 2015-03-10 – 2015-03-15 (×6): 60 mg via ORAL
  Filled 2015-03-09 (×7): qty 1

## 2015-03-09 NOTE — Progress Notes (Signed)
Subjective: Patient has moderate leg pain at the BKA site.   Objective: BP 135/77 mmHg  Pulse 101  Temp(Src) 98.8 F (37.1 C) (Oral)  Resp 20  Ht 5\' 9"  (1.753 m)  Wt 207 lb 0.2 oz (93.9 kg)  BMI 30.56 kg/m2  SpO2 99%  No acute distress. Chest clear to auscultation. Cardiac exam S1 and S2 are regular. Abdominal exam active bowel sounds, soft. Extremities no edema. 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  Medical Problem List and Plan: 1. 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 DVT Prophylaxis/Anticoagulation: Eliquis. Monitor for any bleeding episodes 3. Pain Management: Neurontin 300 mg twice a day, Oxycodone and Robaxin as needed. Monitor and increased mobility 4. Mood/depression: Wellbutrin 150 mg daily, Zoloft 100 mg daily. Provide emotional support 5. Neuropsych: This patient is capable of making decisions on his own behalf. 6. Skin/Wound Care: Routine skin checks, local care to amputation site 7. Fluids/Electrolytes/Nutrition:   Basic Metabolic Panel:    Component Value Date/Time   NA 132* 03/07/2015 0510   NA 136 08/10/2013 1443   K 3.9 03/07/2015 0510   K 4.1 08/10/2013 1443   CL 96* 03/07/2015 0510   CL 103 11/18/2012 1458   CO2 25 03/07/2015 0510   CO2 20* 08/10/2013 1443   BUN 9 03/07/2015 0510   BUN 11.3 08/10/2013 1443   CREATININE 0.73 03/07/2015 0510   CREATININE 0.99 02/19/2015 1535   CREATININE 1.0 08/10/2013 1443   GLUCOSE 69 03/07/2015 0510   GLUCOSE 255* 08/10/2013 1443   GLUCOSE 293* 11/18/2012 1458   CALCIUM 9.2 03/07/2015 0510   CALCIUM 9.4 08/10/2013 1443    8. Acute blood loss anemia. Follow-up CBC CBC:    Component Value Date/Time   WBC 11.9* 03/07/2015 0510   WBC 9.0 08/10/2013 1443   HGB 11.2* 03/07/2015 0510   HGB 15.4 08/10/2013 1443   HCT 33.7* 03/07/2015 0510   HCT 46.7 08/10/2013 1443   PLT 446* 03/07/2015 0510   PLT 252 08/10/2013 1443   MCV 82.4 03/07/2015 0510   MCV 82.0 08/10/2013  1443   NEUTROABS 4.9 08/21/2014 1545   NEUTROABS 6.2 08/10/2013 1443   LYMPHSABS 2.5 08/21/2014 1545   LYMPHSABS 2.1 08/10/2013 1443   MONOABS 0.7 08/21/2014 1545   MONOABS 0.6 08/10/2013 1443   EOSABS 0.1 08/21/2014 1545   EOSABS 0.1 08/10/2013 1443   BASOSABS 0.0 08/21/2014 1545   BASOSABS 0.0 08/10/2013 1443      9. Diabetes mellitus with peripheral neuropathy. Hemoglobin A1c 8.1. DiaBeta 5 mg twice a day, Glucophage 1000 mg twice a day. Check blood sugars before meals and at bedtime. Diabetic teaching CBG (last 3)   Recent Labs  03/08/15 1216 03/08/15 2026 03/09/15 0648  GLUCAP 172* 98 111*     10. CAD. Continue Eliquis 11. Hypertension. Coreg 3.125 mg twice a day. Monitor with increased mobility 127/79-149/94 1. 12. BPH. Flomax 0.4 mg daily. Check PVR 3
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
## 2015-03-10 ENCOUNTER — Inpatient Hospital Stay (HOSPITAL_COMMUNITY): Payer: 59 | Admitting: Occupational Therapy

## 2015-03-10 LAB — GLUCOSE, CAPILLARY
GLUCOSE-CAPILLARY: 138 mg/dL — AB (ref 65–99)
GLUCOSE-CAPILLARY: 129 mg/dL — AB (ref 65–99)
Glucose-Capillary: 64 mg/dL — ABNORMAL LOW (ref 65–99)
Glucose-Capillary: 84 mg/dL (ref 65–99)
Glucose-Capillary: 104 mg/dL — ABNORMAL HIGH (ref 65–99)

## 2015-03-10 NOTE — Progress Notes (Signed)
Occupational Therapy Session Note  Patient Details  Name: Eric Price MRN: [id number] Date of Birth: October 19, 1950  Today's Date: 03/10/2015 OT Individual Time: 9147-8295 OT Individual Time Calculation (min): 50 min    Short Term Goals: Week 1:  OT Short Term Goal 1 (Week 1): STG= LTG secondary to estimated short LOS  Skilled Therapeutic Interventions/Progress Updates:    Pt seen for BADL retraining to include shower and dressing with a focus on safe sit to stands and transfers. Pt received in w/c. Limb support under L leg adjusted to provide more knee extension.  Pt anxious to shower. Wrapped limb carefully to keep waterproof. Pt completed a stand pivot w/c to tub bench in shower using grab bars with close S. On tub bench,pt used lateral leans to doff clothing and to wash bottom. Pt dried off and transferred back to w/c. Dressed from chair using grab bar for support to stand to pull clothing over hips. Donned R ted hose with min cues for adjustment.  Pt's L limb is very hypersensitive to the slightest touch,  But he was able to tolerate the therapy session well. Pt resting in w/c with family with him at the end of the session.  Therapy Documentation Precautions:  Precautions Precautions: Fall Precaution Comments: no pillow under Knee to prevent flexion contracture Restrictions Weight Bearing Restrictions: Yes LLE Weight Bearing: Non weight bearing      Pain: Pain Assessment Pain Assessment: 0-10 Pain Score: 5 Pain Type: Surgical pain Pain Location: Leg Pain Orientation: Left Pain Descriptors / Indicators: Aching Pain Onset: On-going Pain Intervention(s):  (premedicated) ADL:   See FIM for current functional status  Therapy/Group: Individual Therapy  Scalp Level 03/10/2015, 1:03 PM

## 2015-03-10 NOTE — Progress Notes (Signed)
Hypoglycemic Event  CBG: 64  Treatment: 15 GM carbohydrate snack  Symptoms: None  Follow-up CBG: [id number] CBG Result:84  Possible Reasons for Event: Unknown  Comments/MD notified: MD not notified, treated per protocol.    [place]  Remember to initiate Hypoglycemia Order Set & complete

## 2015-03-10 NOTE — Progress Notes (Signed)
Subjective:  Patient feels well this morning. Only has minimal pain at the BKA site.   Objective: BP 134/78 mmHg  Pulse 92  Temp(Src) 98.2 F (36.8 C) (Oral)  Resp 18  Ht 5\' 9"  (1.753 m)  Wt 207 lb 0.2 oz (93.9 kg)  BMI 30.56 kg/m2  SpO2 98%  No acute distress. Chest clear to auscultation. Cardiac exam S1 and S2 are regular. Abdominal exam active bowel sounds, soft. Extremities no edema. 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  Medical Problem List and Plan: 1. 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 DVT Prophylaxis/Anticoagulation: Eliquis. Monitor for any bleeding episodes 3. Pain Management: Neurontin 300 mg twice a day, Oxycodone and Robaxin as needed. Monitor and increased mobility 4. Mood/depression: Wellbutrin 150 mg daily, Zoloft 100 mg daily. Provide emotional support 5. Neuropsych: This patient is capable of making decisions on his own behalf. 6. Skin/Wound Care: Routine skin checks, local care to amputation site 7. Fluids/Electrolytes/Nutrition:   Basic Metabolic Panel:    Component Value Date/Time   NA 132* 03/07/2015 0510   NA 136 08/10/2013 1443   K 3.9 03/07/2015 0510   K 4.1 08/10/2013 1443   CL 96* 03/07/2015 0510   CL 103 11/18/2012 1458   CO2 25 03/07/2015 0510   CO2 20* 08/10/2013 1443   BUN 9 03/07/2015 0510   BUN 11.3 08/10/2013 1443   CREATININE 0.73 03/07/2015 0510   CREATININE 0.99 02/19/2015 1535   CREATININE 1.0 08/10/2013 1443   GLUCOSE 69 03/07/2015 0510   GLUCOSE 255* 08/10/2013 1443   GLUCOSE 293* 11/18/2012 1458   CALCIUM 9.2 03/07/2015 0510   CALCIUM 9.4 08/10/2013 1443    8. Acute blood loss anemia. Follow-up CBC CBC:    Component Value Date/Time   WBC 11.9* 03/07/2015 0510   WBC 9.0 08/10/2013 1443   HGB 11.2* 03/07/2015 0510   HGB 15.4 08/10/2013 1443   HCT 33.7* 03/07/2015 0510   HCT 46.7 08/10/2013 1443   PLT 446* 03/07/2015 0510   PLT 252 08/10/2013 1443   MCV 82.4 03/07/2015  0510   MCV 82.0 08/10/2013 1443   NEUTROABS 4.9 08/21/2014 1545   NEUTROABS 6.2 08/10/2013 1443   LYMPHSABS 2.5 08/21/2014 1545   LYMPHSABS 2.1 08/10/2013 1443   MONOABS 0.7 08/21/2014 1545   MONOABS 0.6 08/10/2013 1443   EOSABS 0.1 08/21/2014 1545   EOSABS 0.1 08/10/2013 1443   BASOSABS 0.0 08/21/2014 1545   BASOSABS 0.0 08/10/2013 1443      9. Diabetes mellitus with peripheral neuropathy. Hemoglobin A1c 8.1. DiaBeta 5 mg twice a day, Glucophage 1000 mg twice a day. Check blood sugars before meals and at bedtime. Diabetic teaching CBG (last 3)   Recent Labs  03/09/15 2131 03/10/15 0627 03/10/15 0653  GLUCAP 120* 64* 84   10. CAD. Continue Eliquis 11. Hypertension. Coreg 3.125 mg twice a day. Monitor with increased mobility 134/78 12. BPH. Flomax 0.4 mg daily. Check PVR 3

## 2015-03-10 NOTE — Plan of Care (Signed)
Problem: RH PAIN MANAGEMENT Goal: RH STG PAIN MANAGED AT OR BELOW PT'S PAIN GOAL Outcome: Progressing Pain less than or equal to 2.   

## 2015-03-11 ENCOUNTER — Inpatient Hospital Stay (HOSPITAL_COMMUNITY): Payer: 59 | Admitting: Occupational Therapy

## 2015-03-11 ENCOUNTER — Inpatient Hospital Stay (HOSPITAL_COMMUNITY): Payer: 59 | Admitting: Physical Therapy

## 2015-03-11 LAB — CBC WITH DIFFERENTIAL/PLATELET
BASOS PCT: 0 % (ref 0–1)
Basophils Absolute: 0 10*3/uL (ref 0.0–0.1)
EOS PCT: 2 % (ref 0–5)
Eosinophils Absolute: 0.2 10*3/uL (ref 0.0–0.7)
HEMATOCRIT: 33.5 % — AB (ref 39.0–52.0)
Hemoglobin: 11.4 g/dL — ABNORMAL LOW (ref 13.0–17.0)
Lymphocytes Relative: 20 % (ref 12–46)
Lymphs Abs: 1.8 10*3/uL (ref 0.7–4.0)
MCH: 28.1 pg (ref 26.0–34.0)
MCHC: 34 g/dL (ref 30.0–36.0)
MCV: 82.7 fL (ref 78.0–100.0)
MONOS PCT: 9 % (ref 3–12)
Monocytes Absolute: 0.8 10*3/uL (ref 0.1–1.0)
NEUTROS ABS: 6.1 10*3/uL (ref 1.7–7.7)
Neutrophils Relative %: 69 % (ref 43–77)
PLATELETS: 501 10*3/uL — AB (ref 150–400)
RBC: 4.05 MIL/uL — ABNORMAL LOW (ref 4.22–5.81)
RDW: 12.4 % (ref 11.5–15.5)
WBC: 8.9 10*3/uL (ref 4.0–10.5)

## 2015-03-11 LAB — COMPREHENSIVE METABOLIC PANEL
ALK PHOS: 120 U/L (ref 38–126)
ALT: 40 U/L (ref 17–63)
AST: 36 U/L (ref 15–41)
Albumin: 2.9 g/dL — ABNORMAL LOW (ref 3.5–5.0)
Anion gap: 11 (ref 5–15)
BILIRUBIN TOTAL: 0.3 mg/dL (ref 0.3–1.2)
BUN: 11 mg/dL (ref 6–20)
CO2: 25 mmol/L (ref 22–32)
Calcium: 9.1 mg/dL (ref 8.9–10.3)
Chloride: 98 mmol/L — ABNORMAL LOW (ref 101–111)
Creatinine, Ser: 0.81 mg/dL (ref 0.61–1.24)
GFR calc Af Amer: >60 mL/min (ref >60)
GFR calc non Af Amer: >60 mL/min (ref >60)
Glucose, Bld: 115 mg/dL — ABNORMAL HIGH (ref 65–99)
POTASSIUM: 4.3 mmol/L (ref 3.5–5.1)
SODIUM: 134 mmol/L — AB (ref 135–145)
Total Protein: 6.6 g/dL (ref 6.5–8.1)

## 2015-03-11 LAB — GLUCOSE, CAPILLARY
GLUCOSE-CAPILLARY: 136 mg/dL — AB (ref 65–99)
Glucose-Capillary: 145 mg/dL — ABNORMAL HIGH (ref 65–99)
Glucose-Capillary: 132 mg/dL — ABNORMAL HIGH (ref 65–99)
Glucose-Capillary: 87 mg/dL (ref 65–99)
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
## 2015-03-11 NOTE — Care Management Note (Signed)
[facility] Individual Statement of Services  Patient Name:  Estelle Skibicki  Date:  03/11/2015  Welcome to the [facility].  Our goal is to provide you with an individualized program based on your diagnosis and situation, designed to meet your specific needs.  With this comprehensive rehabilitation program, you will be expected to participate in at least 3 hours of rehabilitation therapies Monday-Friday, with modified therapy programming on the weekends.  Your rehabilitation program will include the following services:  Physical Therapy (PT), Occupational Therapy (OT), 24 hour per day rehabilitation nursing, Therapeutic Recreaction (TR), Neuropsychology, Case Management (Social Worker), Rehabilitation Medicine, Nutrition Services and Pharmacy Services  Weekly team conferences will be held on Wednesday to discuss your progress.  Your Social Worker will talk with you frequently to get your input and to update you on team discussions.  Team conferences with you and your family in attendance may also be held.  Expected length of stay: 7-9 days  Overall anticipated outcome: mod/i level  Depending on your progress and recovery, your program may change. Your Social Worker will coordinate services and will keep you informed of any changes. Your Social Worker's name and contact numbers are listed  below.  The following services may also be recommended but are not provided by the [facility] will be made to provide these services after discharge if needed.  Arrangements include referral to agencies that provide these services.  Your insurance has been verified to be:  [place] Your primary doctor is:  Programmer, applications  Pertinent information will be shared with your doctor and your insurance  company.  Social Worker:  Ovidio Kin, [place] or ([phone]  Information discussed with and copy given to patient by: Elease Hashimoto, 03/11/2015, 9:44 AM

## 2015-03-11 NOTE — Progress Notes (Signed)
Social Work Patient ID: Eric Price, male   DOB: June 09, 1951, 64 y.o.   MRN: [id number] Spoke with [facility] who wanted an update of pt's evaluations. Have faxed information to her.

## 2015-03-11 NOTE — Progress Notes (Signed)
Patient information reviewed and entered into eRehab system by Marcial Pless, RN, CRRN, PPS Coordinator.  Information including medical coding and functional independence measure will be reviewed and updated through discharge.    

## 2015-03-11 NOTE — Progress Notes (Signed)
Physical Therapy Session Note  Patient Details  Name: Eric Price MRN: [id number] Date of Birth: 1951-09-25  Today's Date: 03/11/2015 PT Individual Time: 1100-1200 PT Individual Time Calculation (min): 60 min   Short Term Goals: Week 1:  PT Short Term Goal 1 (Week 1): STG=LTG  Skilled Therapeutic Interventions/Progress Updates:    Pt received sitting in w/c with wife and mother present; reports phantom pain in LLE of 6/10 (see below); agreeable to treatment. Pt performed gait training with RW and min guard x2 trials for approximately 150 ft; reports moderate fatigue following completion and requires seated rest break x2-3 min to regain energy before next activity. Performed stair training using one handrail as pt reports home has two handrails but they are oriented too far apart to be able to use both. Ascended/descended 6 3-inch stairs using sideways hopping technique with BUEs supported on handrail and minA with verbal cues. Pt has inc difficulty with descent, and one occurrence of poor/incomplete foot placement which he was able to correct without cues or increased assistance. Pt instructed in seated and supine LE strengthening exercises (1 x 10 reps BLEs of each of the following; hip flexion, long arc quad, hip adduction pillow squeeze, hip abduction with green band, supine straight leg raise, bridging, side lying hip abduction). Pts wife reports she is currently on FMLA and available to assist with pt care. Pt returned to w/c in room with all needs within reach, and wife and mother present.   Therapy Documentation Precautions:  Precautions Precautions: Fall Precaution Comments: no pillow under Knee to prevent flexion contracture Restrictions Weight Bearing Restrictions: Yes LLE Weight Bearing: Non weight bearing Pain: Pain Assessment Pain Assessment: 0-10 Pain Score: 6  Pain Type: Phantom pain Pain Location: Leg Pain Orientation: Left Pain Descriptors / Indicators:  Burning;Aching Pain Onset: Gradual Patients Stated Pain Goal: 3 Pain Intervention(s): Distraction;Relaxation Multiple Pain Sites: No Mobility:   Locomotion : Ambulation Ambulation/Gait Assistance: 4: Min guard   See FIM for current functional status  Therapy/Group: Individual Therapy  Luberta Mutter 03/11/2015, 12:42 PM

## 2015-03-11 NOTE — Progress Notes (Signed)
Occupational Therapy Session Note  Patient Details  Name: Eric Price MRN: [id number] Date of Birth: Feb 14, 1951  Today's Date: 03/11/2015 OT Individual Time: 8295-6213 and 0865-7846 OT Individual Time Calculation (min): 60 min and 87 min   Short Term Goals: Week 1:  OT Short Term Goal 1 (Week 1): STG= LTG secondary to estimated short LOS  Skilled Therapeutic Interventions/Progress Updates:    1) Engaged in ADL retraining with focus on functional transfers and safe sit > stands.  Pt in bed upon arrival with RN wrapping residual limb.  Educated pt on observing limb wrapping and plan to progress to pt completing wrapping.  Completed stand pivot transfer bed > w/c with close supervision.  Bathing and dressing completed at sit > stand level at sink with initial steady assist when doffing pants followed by close supervision when standing for hygiene and pulling up pants.  Stand pivot with use of grab bars to elevated toilet seat with close supervision and education of proper positioning of w/c.  Toileting tasks completed at supervision level.  Provided pt with inspection mirror and educated on need to inspect residual limb 1-2x/day when completing limb wrapping.  Discussed mirror therapy and various strategies to reduce phantom limb pain.  Pt propelled w/c through ADL apt kitchen and problem solving accessing items in refrigerator and various cabinets.  2) Engaged in therapeutic activity with focus on pain management, BUE strengthening, and activity tolerance.  Pt in w/c upon arrival with wife present, educated with on purpose of OT and goals.  Pt ambulated with RW to room toilet and completed toileting tasks with supervision.  Hand hygiene completed in standing at sink with RW.  Propelled w/c to therapy gym and completed stand pivot transfer with steady assist without AD to therapy mat.  On mat, setup for mirror therapy with focus on strategies for management of residual limb phantom pain and  desensitization with pt demonstrating intrigue and understanding.  Completed modified situps and lateral leans incorporating reaching tasks to challenge trunk control and core strengthening and stabilization.  Utilized 4# dowel rod with focus on BUE strengthening with chest presses, bicep curls, and straight raises with 3 reps of 10 each.  Encouraged pt to engage in these exercises as well as theraband exercises during downtime.  Completed 10 mins on UE arm bike on resistance level 4.0 with 5 mins forward and 5 backwards with rest break between each.  Pt propelled w/c to room and left seated up in w/c with wife present.  Therapy Documentation Precautions:  Precautions Precautions: Fall Precaution Comments: no pillow under Knee to prevent flexion contracture Restrictions Weight Bearing Restrictions: Yes LLE Weight Bearing: Non weight bearing Pain: Pain Assessment Pain Assessment: 0-10 Pain Score: 5 Faces Pain Scale: Hurts a little bit Pain Type: Acute pain;Surgical pain Pain Location: Leg Pain Orientation: Left Pain Descriptors / Indicators: Aching Pain Frequency: Intermittent Pain Onset: On-going Patients S tated Pain Goal: 3 Pain Intervention(s): Medication (See eMAR);Repositioned  See FIM for current functional status  Therapy/Group: Individual Therapy  [name] 03/11/2015, 9:33 AM

## 2015-03-11 NOTE — Progress Notes (Signed)
[facility] Admission Coordinator Signed Physical Medicine and Rehabilitation PMR Pre-admission 03/07/2015 2:45 PM  Related encounter: Admission (Discharged) from 03/05/2015 in [facility] Collapse All   PMR Admission Coordinator Pre-Admission Assessment  Patient: Eric Price is an 64 y.o., male MRN: [id number] DOB: [birth year] Height: 5' 9"  (175.3 cm) Weight: 101.152 kg (223 lb)  Insurance Information HMO: PPO: PCP: IPA: 80/20: OTHER: [employer] PRIMARY: [employer] Policy#: [id number] Subscriber: self CM Name: Aurelio Jew, RN Phone#: [phone] Fax#: [phone] Approval given on 03-07-15 for 5-7 days. Follow up will be with onsite reviewer Archie Patten, RN (phone: [phone])  Pre-Cert#: [id number] Employer: retired Benefits: Phone #: [phone] Name: per [place]. Date: 10-05-14 Deduct: none Out of Pocket Max: $500 (met all) Life Max: none CIR: 80/20%, pre-auth needed SNF: 80/20%, 60 days visit limits Outpatient: 100% Co-Pay: $20 copay, 23 visit limit for each PT/OT; 30 visit limits for [place]: 80/20% Co-Pay: none, no visit limits DME: 80/20% Co-Pay: none, no visit limits Providers: in network  SECONDARY: [place] Policy#: [id number] Subscriber: pt's wife Yerachmiel Spinney CM Name: Jonelle Sidle Phone#: [phone] Fax#: [phone] Approval given on 03-07-15 for 03-08-15 through 03-18-15. Updates due to Marcie Bal on 03-18-15 if pt is not being discharged that day. Pre-Cert#: [id number] Employer: [employer] Benefits: Phone #: [phone] Name: Cherly Anderson. Date: 10-05-14 Deduct: $38 Indiv. Deductible (none met) Out  of Pocket Max: $3210 OOP towards co-insurance (none met) Life Max: none CIR: 80/20% with $233 copay, pre-auth needed SNF: 80/20%, no visit limits Outpatient: 80/20% Co-Pay: $70 copay, no visit limits Home Health: 80/20% Co-Pay: none DME: 80/20% Co-Pay: none  Emergency Contact Information Contact Information    Name Relation Home Work Mobile   Finazzo,[name] Spouse   [phone]   Parker,Joyce Sister [phone]  [phone]     Current Medical History  Patient Admitting Diagnosis: Left BKA  History of Present Illness: Eric Price is a 64 y.o. right handed male with history of hypertension, CAD maintained on Eliquis, diabetes mellitus and peripheral neuropathy. Independent with a cane prior to admission living with his wife. Presented 03/05/2015 with ischemic left foot secondary to peripheral vascular disease with recent left great toe amputation 02/22/2015. Noted progressive gangrenous changes of left foot and limb was not felt to be salvageable. 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 Oneida Alar. Hospital course pain management. Eliquis resumed postoperatively. Physical and occupational therapy evaluations are pending. M.D. has requested physical medicine rehabilitation consult.    Past Medical History  Past Medical History  Diagnosis Date  . Depression   . Hyperlipidemia   . Hypertension   . Critical lower limb ischemia   . Sleep apnea     10-20 yrs. ago, states he used CPAP, not needed anymore.   . Coronary artery disease   . Shortness of breath dyspnea     related to pain currently  . Peripheral vascular disease   . Pneumonia 2013    hosp. - [facility] x1 week   . Type II diabetes mellitus   . Arthritis     knees, shoulder, hands   . 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     Family History  family history includes CAD (age of  onset: 41) in his father; CAD (age of onset: 71) in his paternal grandfather; Diabetes in his maternal grandmother; Heart failure (age of onset: 24) in his mother;  Hypertension in his father. There is no history of Colon cancer, Esophageal cancer, Stomach cancer, or Rectal cancer.  Prior Rehab/Hospitalizations:  Has the patient had major surgery during 100 days prior to admission? Yes (pt had previous L great toe surgery approximately 3 weeks prior to this admit around 02-21-15. Pt's symptoms kept progressing following this great toe surgery.)  Current Medications   Current facility-administered medications:  . acetaminophen (TYLENOL) tablet 325-650 mg, 325-650 mg, Oral, Q4H PRN **OR** acetaminophen (TYLENOL) suppository 325-650 mg, 325-650 mg, Rectal, Q4H PRN, Samantha J Rhyne, PA-C . alum & mag hydroxide-simeth (MAALOX/MYLANTA) 200-200-20 MG/5ML suspension 15-30 mL, 15-30 mL, Oral, Q2H PRN, Samantha J Rhyne, PA-C . apixaban (ELIQUIS) tablet 5 mg, 5 mg, Oral, BID, Samantha J Rhyne, PA-C, 5 mg at 03/08/15 1002 . atorvastatin (LIPITOR) tablet 40 mg, 40 mg, Oral, BID, Samantha J Rhyne, PA-C, 40 mg at 03/08/15 1002 . bisacodyl (DULCOLAX) suppository 10 mg, 10 mg, Rectal, Daily PRN, Samantha J Rhyne, PA-C . buPROPion (WELLBUTRIN XL) 24 hr tablet 150 mg, 150 mg, Oral, Daily, Samantha J Rhyne, PA-C, 150 mg at 03/08/15 1000 . carvedilol (COREG) tablet 3.125 mg, 3.125 mg, Oral, BID WC, Samantha J Rhyne, PA-C, 3.125 mg at 03/08/15 1001 . clobetasol cream (TEMOVATE) 0.05 %, , Topical, BID, Samantha J Rhyne, PA-C . cyclobenzaprine (FLEXERIL) tablet 10 mg, 10 mg, Oral, TID PRN, Hulen Shouts Rhyne, PA-C, 10 mg at 03/06/15 2215 . docusate sodium (COLACE) capsule 100 mg, 100 mg, Oral, Daily, Samantha J Rhyne, PA-C, 100 mg at 03/08/15 1002 . feeding supplement (PRO-STAT SUGAR FREE 64) liquid 30 mL, 30 mL, Oral, BID, Samantha J Rhyne, PA-C, 30 mL at 03/08/15 1002 . fenofibrate tablet 54 mg, 54 mg,  Oral, Daily, Samantha J Rhyne, PA-C, 54 mg at 03/08/15 1002 . gabapentin (NEURONTIN) capsule 300 mg, 300 mg, Oral, BID, Samantha J Rhyne, PA-C, 300 mg at 03/08/15 1002 . glyBURIDE (DIABETA) tablet 5 mg, 5 mg, Oral, BID WC, Samantha J Rhyne, PA-C, 5 mg at 03/08/15 0656 . guaiFENesin-dextromethorphan (ROBITUSSIN DM) 100-10 MG/5ML syrup 15 mL, 15 mL, Oral, Q4H PRN, Samantha J Rhyne, PA-C . hydrALAZINE (APRESOLINE) injection 5 mg, 5 mg, Intravenous, Q20 Min PRN, [employer], PA-C . HYDROmorphone (DILAUDID) injection 0.5-1 mg, 0.5-1 mg, Intravenous, Q2H PRN, Samantha J Rhyne, PA-C, 1 mg at 03/06/15 1123 . hydrOXYzine (ATARAX/VISTARIL) tablet 10 mg, 10 mg, Oral, TID PRN, Samantha J Rhyne, PA-C . insulin aspart (novoLOG) injection 0-15 Units, 0-15 Units, Subcutaneous, TID WC, Samantha J Rhyne, PA-C, 2 Units at 03/07/15 1718 . labetalol (NORMODYNE,TRANDATE) injection 10 mg, 10 mg, Intravenous, Q10 min PRN, Samantha J Rhyne, PA-C . metFORMIN (GLUCOPHAGE) tablet 1,000 mg, 1,000 mg, Oral, BID WC, Samantha J Rhyne, PA-C, 1,000 mg at 03/08/15 0656 . metoprolol (LOPRESSOR) injection 2-5 mg, 2-5 mg, Intravenous, Q2H PRN, Samantha J Rhyne, PA-C . nitroGLYCERIN (NITROSTAT) SL tablet 0.4 mg, 0.4 mg, Sublingual, Q5 min PRN, Samantha J Rhyne, PA-C . ondansetron (ZOFRAN) injection 4 mg, 4 mg, Intravenous, Q6H PRN, Samantha J Rhyne, PA-C . oxyCODONE-acetaminophen (PERCOCET/ROXICET) 5-325 MG per tablet 1-2 tablet, 1-2 tablet, Oral, Q4H PRN, 1 tablet at 03/07/15 2000 **AND** oxyCODONE (Oxy IR/ROXICODONE) immediate release tablet 5-10 mg, 5-10 mg, Oral, Q4H PRN, [name], MD, 10 mg at 03/08/15 1001 . pantoprazole (PROTONIX) EC tablet 40 mg, 40 mg, Oral, Daily, Samantha J Rhyne, PA-C, 40 mg at 03/08/15 1000 . phenol (CHLORASEPTIC) mouth spray 1 spray, 1 spray, Mouth/Throat, PRN, Samantha J Rhyne, PA-C . potassium chloride SA (K-DUR,KLOR-CON) CR  tablet 20-40 mEq, 20-40 mEq, Oral, Daily PRN,  Samantha J Rhyne, PA-C . senna-docusate (Senokot-S) tablet 1 tablet, 1 tablet, Oral, QHS PRN, Samantha J Rhyne, PA-C . sertraline (ZOLOFT) tablet 100 mg, 100 mg, Oral, Daily, Samantha J Rhyne, PA-C, 100 mg at 03/08/15 1002 . tamsulosin (FLOMAX) capsule 0.4 mg, 0.4 mg, Oral, Daily, Samantha J Rhyne, PA-C, 0.4 mg at 03/08/15 1002 . triamcinolone cream (KENALOG) 0.1 % 1 application, 1 application, Topical, Daily PRN, Hulen Shouts Rhyne, PA-C  Patients Current Diet: Diet Carb Modified Fluid consistency:: Thin; Room service appropriate?: Yes  Precautions / Restrictions Precautions Precautions: Fall Precaution Comments: Discussed positioning with no pillow under knee.  Restrictions Weight Bearing Restrictions: Yes LLE Weight Bearing: Non weight bearing   Has the patient had 2 or more falls or a fall with injury in the past year?No  Prior Activity Level Limited Community (1-2x/wk): Pt has had limited mobility since his L toe sx for the past three weeks and was getting around with crutches or walker. Prior to toe surgery on 02-21-15, pt was independent and driving. 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   Home Assistive Devices / Equipment Home Assistive Devices/Equipment: Crutches, Environmental consultant (specify type), Eyeglasses, Dentures (specify type), CBG Meter (partial x2 ) Home Equipment: Walker - 2 wheels  Prior Device Use: Indicate devices/aids used by the patient prior to current illness, exacerbation or injury? Walker and single point cane (pt needed to use these following his L great toe surgery on 02-21-15. Per wife, he was "hobbling around" and needed to use either the crutches/cane/walker.)  Prior Functional Level Prior Function Level of Independence: Needs assistance Gait / Transfers Assistance Needed: Using the RW ADL's / Homemaking Assistance Needed: Wife assisting with ADLs  Self Care: Did the patient need help bathing, dressing, using the toilet or  eating? Independent  Indoor Mobility: Did the patient need assistance with walking from room to room (with or without device)? Independent but he was definitely "wobbling" per wife. He did not need physical assistance though.  Stairs: Did the patient need assistance with internal or external stairs (with or without device)? Needed some help  Functional Cognition: Did the patient need help planning regular tasks such as shopping or remembering to take medications? Independent  Current Functional Level Cognition  Overall Cognitive Status: Within Functional Limits for tasks assessed Orientation Level: Oriented to person, Oriented to place, Disoriented to time, Disoriented to situation   Extremity Assessment (includes Sensation/Coordination)  Upper Extremity Assessment: Overall WFL for tasks assessed  Lower Extremity Assessment: Defer to PT evaluation LLE Deficits / Details: Decreased strength and AROM consistent with recent BKA.     ADLs  Overall ADL's : Needs assistance/impaired Grooming: Set up, Sitting Upper Body Dressing : Set up, Sitting Lower Body Dressing: Minimal assistance, Sit to/from stand Toilet Transfer: Min guard, Minimal assistance, Stand-pivot, Ambulation, RW, Moderate assistance (bed/chair; Mod assist to stand from bed;) Functional mobility during ADLs: Minimal assistance, Min guard, Rolling walker (Min guard-stand pivot; Min assist for ambulation) General ADL Comments: Educated on desensitization techniques for LLE. Educated on LB dressing technique. Discussed options for shower chair and two uses of 3 in 1.    Mobility  Overal bed mobility: Needs Assistance Bed Mobility: Rolling, Sidelying to Sit Rolling: Min guard Sidelying to sit: Min guard Supine to sit: Min assist General bed mobility comments: Pt was able to transition to EOB with heavy use of bed rails for support and increased time. Min guard for trunk support as  pt elevated to full sitting  position.     Transfers  Overall transfer level: Needs assistance Equipment used: Rolling walker (2 wheeled) Transfers: Sit to/from Stand Sit to Stand: Min guard, Min assist Stand pivot transfers: Min guard General transfer comment: Initially min assist provided for power-up to full stand. On following attempts pt required min guard assist to achieve full standing. VC's for hand placement on seated surface for safety.     Ambulation / Gait / Stairs / Wheelchair Mobility  Ambulation/Gait Ambulation/Gait assistance: Min guard, Min assist Ambulation Distance (Feet): 75 Feet Assistive device: Rolling walker (2 wheeled) General Gait Details: 1 seated rest break required and chair follow utilized. Occasional min assist was provided for unsteadiness. Gait Pattern/deviations: Decreased stride length, Trunk flexed, Step-to pattern Gait velocity: Decreased Gait velocity interpretation: Below normal speed for age/gender    Posture / Balance Balance Overall balance assessment: Needs assistance Sitting-balance support: Feet supported, No upper extremity supported Sitting balance-Leahy Scale: Fair Standing balance support: Bilateral upper extremity supported, During functional activity Standing balance-Leahy Scale: Poor Standing balance comment: Requires UE support for standing balance.     Special needs/care consideration BiPAP/CPAP no CPM no  Continuous Drip IV no  Dialysis no  Life Vest no  Oxygen no  Special Bed no  Trach Size no  Wound Vac (area) no  Skin - current healing L BKA surgical site. Note, pt also has history of skin cancer and received treatment three years ago at [facility]. Pt can get skin nodules all over.   Bowel mgmt: last BM on 03-07-15 Bladder mgmt: using urinal Diabetic mgmt - managed at home with insulin/medications    Previous Home Environment Living Arrangements: Spouse/significant other Available Help  at Discharge: Family, Available 24 hours/day (wife can't do a lot of physical assistance. She has medical issues herself but can provide supervision and possibly light physical help if needed.) Type of Home: House Home Layout: Two level, Able to live on main level with bedroom/bathroom Home Access: Stairs to enter Entrance Stairs-Rails: Right, Left Entrance Stairs-Number of Steps: 5 Bathroom Shower/Tub: Gaffer, Architectural technologist: Handicapped height [place]: No  Discharge Living Setting Plans for Discharge Living Setting: Patient's home Type of Home at Discharge: House Discharge Home Layout: Two level, Able to live on main level with bedroom/bathroom Alternate Level Stairs-Number of Steps: flight of steps Discharge Home Access: Stairs to enter Entrance Stairs-Rails: Right, Left Entrance Stairs-Number of Steps: 5 Discharge Bathroom Shower/Tub: Walk-in shower Discharge Bathroom Toilet: Handicapped height Does the patient have any problems obtaining your medications?: No  Social/Family/Support Systems Patient Roles: Spouse, Other (Comment) (retired Horticulturist, 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 Information: wife [name] is primary contact Anticipated Caregiver: wife Anticipated Ambulance person Information: see above Ability/Limitations of Caregiver: pt's wife has physical limitations and cannot provide physical assistance Caregiver Availability: 24/7 Discharge Plan Discussed with Primary Caregiver: Yes Is Caregiver In Agreement with Plan?: Yes Does Caregiver/Family have Issues with Lodging/Transportation while Pt is in Rehab?: No  Goals/Additional Needs Patient/Family Goal for Rehab: Mod Ind with PT/OT; NA for SLP Expected length of stay: 7 days Cultural Considerations: [employer] Dietary Needs: carb modified, thin liquids Equipment Needs: to be determined Pt/Family Agrees to Admission and willing to participate: Yes (have spoken with pt's wife  several times) Program Orientation Provided & Reviewed with Pt/Caregiver Including Roles & Responsibilities: Yes   Decrease burden of Care through IP rehab admission: NA   Possible need for SNF placement upon discharge: not anticipated  Patient Condition: This patient's medical and functional status has changed since the consult dated: 03-06-15 in which the Rehabilitation Physician determined and documented that the patient's condition is appropriate for intensive rehabilitative care in an inpatient rehabilitation facility. See "History of Present Illness" (above) for medical update. Functional changes are: minimal to min guard assist with mobility and min to moderate assist with self care tasks. Patient's medical and functional status update has been discussed with the Rehabilitation physician and patient remains appropriate for inpatient rehabilitation. Will admit to inpatient rehab today.  Preadmission Screen Completed By: Nanetta Batty, PT, 03/08/2015 11:08 AM ______________________________________________________________________  Discussed status with Dr. Naaman Plummer on 03-08-15 at 1128 and received telephone approval for admission today.  Admission Coordinator: Nanetta Batty, PT, time1128/Date 03-08-15          Cosigned by: Meredith Staggers, MD at 03/08/2015 11:33 AM  Revision History     Date/Time User Provider Type Action   03/08/2015 11:33 AM Meredith Staggers, MD Physician Cosign   03/08/2015 11:30 AM [facility] Admission Coordinator Sign

## 2015-03-11 NOTE — Progress Notes (Signed)
64 y.o. right handed male with history of hypertension, CAD maintained on Eliquis, diabetes mellitus and peripheral neuropathy. Independent with a cane prior to admission living with his wife. Presented 03/05/2015 with ischemic left foot secondary to peripheral vascular disease with recent left great toe amputation 02/22/2015. Noted progressive gangrenous changes of left foot and limb was not felt to be salvageable. Underwent left below-knee amputation 03/05/2015 Subjective/Complaints:   Objective: Vital Signs: Blood pressure 111/72, pulse 95, temperature 99 F (37.2 C), temperature source Oral, resp. rate 18, height 5' 9" (1.753 m), weight 93.9 kg (207 lb 0.2 oz), SpO2 98 %. No results found. Results for orders placed or performed during the hospital encounter of 03/08/15 (from the past 72 hour(s))  Glucose, capillary     Status: None   Collection Time: 03/08/15  8:26 PM  Result Value Ref Range   Glucose-Capillary 98 65 - 99 mg/dL  Glucose, capillary     Status: Abnormal   Collection Time: 03/09/15  6:48 AM  Result Value Ref Range   Glucose-Capillary 111 (H) 65 - 99 mg/dL  Glucose, capillary     Status: Abnormal   Collection Time: 03/09/15 11:33 AM  Result Value Ref Range   Glucose-Capillary 167 (H) 65 - 99 mg/dL   Comment 1 Notify RN   Glucose, capillary     Status: Abnormal   Collection Time: 03/09/15  4:37 PM  Result Value Ref Range   Glucose-Capillary 185 (H) 65 - 99 mg/dL   Comment 1 Notify RN   Glucose, capillary     Status: Abnormal   Collection Time: 03/09/15  9:31 PM  Result Value Ref Range   Glucose-Capillary 120 (H) 65 - 99 mg/dL  Glucose, capillary     Status: Abnormal   Collection Time: 03/10/15  6:27 AM  Result Value Ref Range   Glucose-Capillary 64 (L) 65 - 99 mg/dL  Glucose, capillary     Status: None   Collection Time: 03/10/15  6:53 AM  Result Value Ref Range   Glucose-Capillary 84 65 - 99 mg/dL  Glucose, capillary     Status: Abnormal   Collection Time:  03/10/15 11:37 AM  Result Value Ref Range   Glucose-Capillary 138 (H) 65 - 99 mg/dL   Comment 1 Notify RN   Glucose, capillary     Status: Abnormal   Collection Time: 03/10/15  4:45 PM  Result Value Ref Range   Glucose-Capillary 104 (H) 65 - 99 mg/dL   Comment 1 Notify RN   Glucose, capillary     Status: Abnormal   Collection Time: 03/10/15  8:48 PM  Result Value Ref Range   Glucose-Capillary 129 (H) 65 - 99 mg/dL  CBC WITH DIFFERENTIAL     Status: Abnormal   Collection Time: 03/11/15  6:00 AM  Result Value Ref Range   WBC 8.9 4.0 - 10.5 K/uL   RBC 4.05 (L) 4.22 - 5.81 MIL/uL   Hemoglobin 11.4 (L) 13.0 - 17.0 g/dL   HCT 33.5 (L) 39.0 - 52.0 %   MCV 82.7 78.0 - 100.0 fL   MCH 28.1 26.0 - 34.0 pg   MCHC 34.0 30.0 - 36.0 g/dL   RDW 12.4 11.5 - 15.5 %   Platelets 501 (H) 150 - 400 K/uL   Neutrophils Relative % 69 43 - 77 %   Neutro Abs 6.1 1.7 - 7.7 K/uL   Lymphocytes Relative 20 12 - 46 %   Lymphs Abs 1.8 0.7 - 4.0 K/uL   Monocytes Relative 9  3 - 12 %   Monocytes Absolute 0.8 0.1 - 1.0 K/uL   Eosinophils Relative 2 0 - 5 %   Eosinophils Absolute 0.2 0.0 - 0.7 K/uL   Basophils Relative 0 0 - 1 %   Basophils Absolute 0.0 0.0 - 0.1 K/uL  Comprehensive metabolic panel     Status: Abnormal   Collection Time: 03/11/15  6:00 AM  Result Value Ref Range   Sodium 134 (L) 135 - 145 mmol/L   Potassium 4.3 3.5 - 5.1 mmol/L   Chloride 98 (L) 101 - 111 mmol/L   CO2 25 22 - 32 mmol/L   Glucose, Bld 115 (H) 65 - 99 mg/dL   BUN 11 6 - 20 mg/dL   Creatinine, Ser 0.81 0.61 - 1.24 mg/dL   Calcium 9.1 8.9 - 10.3 mg/dL   Total Protein 6.6 6.5 - 8.1 g/dL   Albumin 2.9 (L) 3.5 - 5.0 g/dL   AST 36 15 - 41 U/L   ALT 40 17 - 63 U/L   Alkaline Phosphatase 120 38 - 126 U/L   Total Bilirubin 0.3 0.3 - 1.2 mg/dL   GFR calc non Af Amer >60 >60 mL/min   GFR calc Af Amer >60 >60 mL/min    Comment: (NOTE) The eGFR has been calculated using the CKD EPI equation. This calculation has not been  validated in all clinical situations. eGFR's persistently <60 mL/min signify possible Chronic Kidney Disease.    Anion gap 11 5 - 15  Glucose, capillary     Status: Abnormal   Collection Time: 03/11/15  6:39 AM  Result Value Ref Range   Glucose-Capillary 145 (H) 65 - 99 mg/dL     HEENT: poor dentition Cardio: RRR and no murmurs Resp: CTA B/L and unlabored GI: BS positive and NT, ND Extremity:  Pulses positive and No Edema Skin:   Wound Left BK wound staples intact "dog ears" distal edema ,no drainage Neuro: Alert/Oriented and Abnormal Motor 4/5 Left HF, R HF, KE, ankle DF,  5/5 in BUE Musc/Skel:  Swelling distal stump edema Gen NAD   Assessment/Plan: 1. 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 Physiatrist is providing close team supervision and 24 hour management of active medical problems listed below. Physiatrist and rehab team continue to assess barriers to discharge/monitor patient progress toward functional and medical goals. FIM: FIM - Bathing Bathing Steps Patient Completed: Chest, Right lower leg (including foot), Right Arm, Left Arm, Abdomen, Front perineal area, Buttocks, Right upper leg, Left upper leg Bathing: 5: Supervision: Safety issues/verbal cues  FIM - Upper Body Dressing/Undressing Upper body dressing/undressing steps patient completed: Thread/unthread left sleeve of pullover shirt/dress, Put head through opening of pull over shirt/dress, Thread/unthread right sleeve of pullover shirt/dresss, Pull shirt over trunk Upper body dressing/undressing: 5: Set-up assist to: Obtain clothing/put away FIM - Lower Body Dressing/Undressing Lower body dressing/undressing steps patient completed: Thread/unthread right underwear leg, Thread/unthread left underwear leg, Pull underwear up/down, Thread/unthread right pants leg, Thread/unthread left pants leg, Pull pants  up/down, Don/Doff right shoe, Fasten/unfasten right shoe, Don/Doff right sock (donned Ted hose) Lower body dressing/undressing: 5: Supervision: Safety issues/verbal cues (UE support on grab bar)     FIM - Radio producer Devices: Elevated toilet seat, Insurance account manager Transfers: 4-To toilet/BSC: Min A (steadying Pt. > 75%), 4-From toilet/BSC: Min A (steadying Pt. > 75%)  FIM - Control and instrumentation engineer Devices: Environmental consultant  Bed/Chair Transfer: 4: Bed > Chair or W/C: Min A (steadying Pt. > 75%), 4: Chair or W/C > Bed: Min A (steadying Pt. > 75%)  FIM - Locomotion: Wheelchair Distance: 150 Locomotion: Wheelchair: 2: Travels 75 - 149 ft with supervision, cueing or coaxing FIM - Locomotion: Ambulation Locomotion: Ambulation Assistive Devices: Administrator Ambulation/Gait Assistance:  (70)  Comprehension Comprehension Mode: Auditory Comprehension: 5-Follows basic conversation/direction: With extra time/assistive device  Expression Expression Mode: Verbal Expression: 5-Expresses basic needs/ideas: With extra time/assistive device  Social Interaction Social Interaction: 6-Interacts appropriately with others with medication or extra time (anti-anxiety, antidepressant).  Problem Solving Problem Solving: 4-Solves basic 75 - 89% of the time/requires cueing 10 - 24% of the time  Memory Memory: 4-Recognizes or recalls 75 - 89% of the time/requires cueing 10 - 24% of the time  Medical Problem List and Plan: 1. 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 DVT Prophylaxis/Anticoagulation: Eliquis. Monitor for any bleeding episodes 3. Pain Management: Neurontin 300 mg twice a day, Oxycodone and Robaxin as needed. Monitor and increased mobility 4. Mood/depression: Wellbutrin 150 mg daily, Zoloft 100 mg daily. Provide emotional support 5. Neuropsych: This patient is capable of making decisions on his own  behalf. 6. Skin/Wound Care: Routine skin checks, local care to amputation site 7. Fluids/Electrolytes/Nutrition: Routine I&O with follow-up chemistries 8. Acute blood loss anemia. Follow-up CBC 9. Diabetes mellitus with peripheral neuropathy. Hemoglobin A1c 8.1. DiaBeta 5 mg twice a day, Glucophage 1000 mg twice a day. Check blood sugars before meals and at bedtime. Diabetic teaching 10. CAD. Continue Eliquis 11. Hypertension. Coreg 3.125 mg twice a day. Monitor with increased mobility 12. BPH. Flomax 0.4 mg daily. Check PVR 3  LOS (Days) 3 A FACE TO FACE EVALUATION WAS PERFORMED  , E 03/11/2015, 8:25 AM

## 2015-03-11 NOTE — IPOC Note (Signed)
Overall Plan of Care [facility]) Patient Details Name: Tee Richeson MRN: [id number] DOB: 04/09/1951  Admitting Diagnosis: L BKA  Hospital Problems: Principal Problem:   Status post below knee amputation of left lower extremity Active Problems:   Essential hypertension   BPH (benign prostatic hyperplasia)   Diabetes mellitus type 2, uncontrolled   History of left below knee amputation     Functional Problem List: Nursing Bladder, Bowel, Edema, Endurance, Medication Management, Motor, Nutrition, Pain, Perception, Safety, Sensory, Skin Integrity  PT Pain, Endurance, Balance  OT Balance, Pain, Safety, Endurance, Motor  SLP    TR         Basic ADL's: OT Grooming, Bathing, Dressing, Toileting     Advanced  ADL's: OT       Transfers: PT Bed to Chair, Car  OT Toilet, Tub/Shower     Locomotion: PT Ambulation, Stairs     Additional Impairments: OT None  SLP        TR      Anticipated Outcomes Item Anticipated Outcome  Self Feeding n/a  Swallowing      Basic self-care  Mod I   Toileting  Mod I    Bathroom Transfers Mod I   Bowel/Bladder  Pt will be continent of bowel and bladder with min assist   Transfers  modified Independance  Locomotion  modified Independance  Communication     Cognition     Pain  pt will rate pain less than 3   Safety/Judgment  Pt will remain safe and without falls   Therapy Plan: PT Intensity: Minimum of 1-2 x/day ,45 to 90 minutes PT Duration Estimated Length of Stay: 7-9 days OT Intensity: Minimum of 1-2 x/day, 45 to 90 minutes OT Frequency: 5 out of 7 days OT Duration/Estimated Length of Stay: 7-9 days         Team Interventions: Nursing Interventions Patient/Family Education, Bladder Management, Bowel Management, Pain Management, Disease Management/Prevention, Medication Management, Skin Care/Wound Management, Psychosocial Support, Discharge Planning  PT interventions Ambulation/gait training, Neuromuscular re-education,  Patient/family education, Pain management, Therapeutic Activities, UE/LE Strength taining/ROM, Functional mobility training, Therapeutic Exercise, UE/LE Coordination activities, Stair training  OT Interventions Training and development officer, Community reintegration, Barrister's clerk education, Self Care/advanced ADL retraining, Therapeutic Exercise, Wheelchair propulsion/positioning, UE/LE Strength taining/ROM, Therapeutic Activities, Pain management, Functional mobility training, DME/adaptive equipment instruction, Discharge planning, Cognitive remediation/compensation  SLP Interventions    TR Interventions    SW/CM Interventions Discharge Planning, Psychosocial Support, Patient/Family Education    Team Discharge Planning: Destination: PT-Home ,OT- Home , SLP-  Projected Follow-up: PT-Home health PT, OT-  Outpatient OT, SLP-  Projected Equipment Needs: PT-To be determined, OT- 3 in 1 bedside comode, Tub/shower seat, SLP-  Equipment Details: PT- , OT-  Patient/family involved in discharge planning: PT- Patient, Family member/caregiver,  OT-Patient, SLP-   MD ELOS: 7-9 days Medical Rehab Prognosis:  Excellent Assessment: The patient has been admitted for CIR therapies with the diagnosis of left BKA. The team will be addressing functional mobility, strength, stamina, balance, safety, adaptive techniques and equipment, self-care, bowel and bladder mgt, patient and caregiver education, pain mgt, pre-prosthetic education, ego support/coping, community reintegration. Goals have been set at mod I for mobility and self-care.    Meredith Staggers, MD, FAAPMR      See Team Conference Notes for weekly updates to the plan of care
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
## 2015-03-12 ENCOUNTER — Inpatient Hospital Stay (HOSPITAL_COMMUNITY): Payer: 59

## 2015-03-12 ENCOUNTER — Inpatient Hospital Stay (HOSPITAL_COMMUNITY): Payer: 59 | Admitting: Physical Therapy

## 2015-03-12 ENCOUNTER — Encounter (HOSPITAL_COMMUNITY): Payer: 59

## 2015-03-12 ENCOUNTER — Inpatient Hospital Stay (HOSPITAL_COMMUNITY): Payer: 59 | Admitting: Occupational Therapy

## 2015-03-12 DIAGNOSIS — G546 Phantom limb syndrome with pain: Secondary | ICD-10-CM

## 2015-03-12 LAB — GLUCOSE, CAPILLARY
GLUCOSE-CAPILLARY: 121 mg/dL — AB (ref 65–99)
GLUCOSE-CAPILLARY: 94 mg/dL (ref 65–99)
Glucose-Capillary: 130 mg/dL — ABNORMAL HIGH (ref 65–99)
Glucose-Capillary: 182 mg/dL — ABNORMAL HIGH (ref 65–99)

## 2015-03-12 MED ORDER — GABAPENTIN 300 MG PO CAPS
300.0000 mg | ORAL_CAPSULE | Freq: Three times a day (TID) | ORAL | Status: DC
  Administered 2015-03-12 – 2015-03-13 (×6): 300 mg via ORAL
  Filled 2015-03-12 (×10): qty 1

## 2015-03-12 NOTE — Progress Notes (Signed)
Occupational Therapy Session Note  Patient Details  Name: Eric Price MRN: [id number] Date of Birth: [birth year]  Today's Date: 03/12/2015 OT Individual Time: [phone] OT Individual Time Calculation (min): 60 min   Short Term Goals: Week 1:  OT Short Term Goal 1 (Week 1): STG= LTG secondary to estimated short LOS  Skilled Therapeutic Interventions/Progress Updates: ADL-retraining at shower level with focus on transfers, effective use of DME (grab bars, tub bench), adapted bathing/dressing skills, dynamic standing balance.   Pt completed transfers with min assist and setup to reposition w/c.   Pt gathered his clothing and propelled w/c to bathing to bathe seated at tub bench unassisted after setup, standing to wash buttocks using grab bar.    Pt returned to w/c after bathing and dressed and groomed at sink with only setup assist to don TEDs.   Pt demo's good awareness of fall risk and sequence and was w/o pain during session.          Therapy Documentation Precautions:  Precautions Precautions: Fall Precaution Comments: no pillow under Knee to prevent flexion contracture Restrictions Weight Bearing Restrictions: Yes LLE Weight Bearing: Non weight bearing  Vital Signs: Therapy Vitals Temp: 97.7 F (36.5 C) Temp Source: Oral Pulse Rate: (!) 108 Resp: 18 BP: 117/78 mmHg Patient Position (if appropriate): Lying Oxygen Therapy SpO2: 96 % O2 Device: Not Delivered   Pain: Pain Assessment Pain Score: 8  Pain Type: Surgical pain Pain Location: Leg Pain Orientation: Left Pain Descriptors / Indicators: Nagging Pain Frequency: Intermittent Pain Onset: On-going Pain Intervention(s): Medication (See eMAR)   See FIM for current functional status  Therapy/Group: Individual Therapy  [place] 03/12/2015, 3:33 PM

## 2015-03-12 NOTE — Progress Notes (Signed)
Physical Therapy Session Note  Patient Details  Name: Eric Price MRN: [id number] Date of Birth: 1951/05/25  Today's Date: 03/12/2015 PT Individual Time: 1300-1415 PT Individual Time Calculation (min): 75 min   Short Term Goals: Week 1:  PT Short Term Goal 1 (Week 1): STG=LTG  Skilled Therapeutic Interventions/Progress Updates:    Pt received in bed asleep with wife present; pt agreeable to treatment and reports pain in LLE as described below. Nurse alerted and pt received medication at start of treatment session. Pt performed w/c propulsion from room to therapy gym with supervision for navigation. Instructed pt in ascent/descent of 8 3-inch steps with BUEs on L handrail to simulate home environment; performed with minA and verbal/visual cues for correct foot placement. Pt very fatigued after completion of stairs and requires seated rest break. Pt instructed in various obstacle course tasks including negotiating RW over various objects, stepping onto foam, retrieving objects from various heights; all performed with supervision/minA due to occasional mild LOB which pt is able to recover without assistance. In standing pt performed catching/throwing balls of various sizes to/from wife to improve RLE dynamic standing balance as well as activity tolerance. Pt educated on use of hip and ankle strategies to regain balance as opposed to grabbing onto unstable RW for support, however still requires occasional minA to maintain balance. Pt instructed in negotiation of 1 3-inch step onto platform with RW to simulate community environment and curbs. Pt reports hesitancy with ascent, inc comfort with descent, however is able to perform both with CGA and demonstrates safe management of RW and foot placement. Pt performed gait from therapy gym to return to room for total of 261ft with supervision and 3 standing rest breaks due to fatigue. When pt becomes fatigued, demonstrates increased speed of gait and dec control and  safety with RW. Pt returned to room and seated in recliner with all needs within reach.   Therapy Documentation Precautions:  Precautions Precautions: Fall Precaution Comments: no pillow under Knee to prevent flexion contracture Restrictions Weight Bearing Restrictions: Yes LLE Weight Bearing: Non weight bearing Pain: Pain Assessment Pain Assessment: 0-10 Pain Score: 8  Pain Type: Surgical pain Pain Location: Leg Pain Orientation: Left Pain Descriptors / Indicators: Nagging Pain Frequency: Intermittent Pain Onset: On-going Patients Stated Pain Goal: 3 Pain Intervention(s): RN made aware;Other (Comment) (received medication from RN at beginning of treatment session) Multiple Pain Sites: No Locomotion : Ambulation Ambulation/Gait Assistance: 5: Supervision Wheelchair Mobility Distance: 150   See FIM for current functional status  Therapy/Group: Individual Therapy  Luberta Mutter 03/12/2015, 3:49 PM

## 2015-03-12 NOTE — Progress Notes (Signed)
64 y.o. right handed male with history of hypertension, CAD maintained on Eliquis, diabetes mellitus and peripheral neuropathy. Independent with a cane prior to admission living with his wife. Presented 03/05/2015 with ischemic left foot secondary to peripheral vascular disease with recent left great toe amputation 02/22/2015. Noted progressive gangrenous changes of left foot and limb was not felt to be salvageable. 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 rate 16, height _0  (1.753 m), weight 93.9 kg (207 lb 0.2 oz), SpO2 97 %. No results found. Results for orders placed or performed during the hospital encounter of 03/08/15 (from the past 72 hour(s))  Glucose, capillary     Status: Abnormal   Collection Time: 03/09/15 11:33 AM  Result Value Ref Range   Glucose-Capillary 167 (H) 65 - 99 mg/dL   Comment 1 Notify RN   Glucose, capillary     Status: Abnormal   Collection Time: 03/09/15  4:37 PM  Result Value Ref Range   Glucose-Capillary 185 (H) 65 - 99 mg/dL   Comment 1 Notify RN   Glucose, capillary     Status: Abnormal   Collection Time: 03/09/15  9:31 PM  Result Value Ref Range   Glucose-Capillary 120 (H) 65 - 99 mg/dL  Glucose, capillary     Status: Abnormal   Collection Time: 03/10/15  6:27 AM  Result Value Ref Range   Glucose-Capillary 64 (L) 65 - 99 mg/dL  Glucose, capillary     Status: None   Collection Time: 03/10/15  6:53 AM  Result Value Ref Range   Glucose-Capillary 84 65 - 99 mg/dL  Glucose, capillary     Status: Abnormal   Collection Time: 03/10/15 11:37 AM  Result Value Ref Range   Glucose-Capillary 138 (H) 65 - 99 mg/dL   Comment 1 Notify RN   Glucose, capillary     Status: Abnormal   Collection Time: 03/10/15  4:45 PM  Result Value Ref Range   Glucose-Capillary 104 (H) 65 - 99 mg/dL   Comment 1 Notify RN   Glucose, capillary     Status: Abnormal   Collection Time: 03/10/15  8:48 PM  Result Value Ref Range   Glucose-Capillary 129 (H) 65 - 99 mg/dL  CBC WITH DIFFERENTIAL     Status: Abnormal   Collection Time: 03/11/15  6:00 AM  Result Value Ref Range   WBC 8.9 4.0 - 10.5 K/uL   RBC 4.05 (L) 4.22 - 5.81 MIL/uL   Hemoglobin 11.4 (L) 13.0 - 17.0 g/dL   HCT 33.5 (L) 39.0 - 52.0 %   MCV 82.7 78.0 - 100.0 fL   MCH 28.1 26.0 - 34.0 pg   MCHC 34.0 30.0 - 36.0 g/dL   RDW 12.4 11.5 - 15.5 %   Platelets 501 (H) 150 - 400 K/uL   Neutrophils Relative % 69 43 - 77 %   Neutro Abs 6.1 1.7 - 7.7 K/uL   Lymphocytes Relative 20 12 - 46 %   Lymphs Abs 1.8 0.7 - 4.0 K/uL   Monocytes Relative 9 3 - 12 %   Monocytes Absolute 0.8 0.1 - 1.0 K/uL   Eosinophils Relative 2 0 - 5 %   Eosinophils Absolute 0.2 0.0 - 0.7 K/uL   Basophils Relative 0 0 - 1 %  Basophils Absolute 0.0 0.0 - 0.1 K/uL  Comprehensive metabolic panel     Status: Abnormal   Collection Time: 03/11/15  6:00 AM  Result Value Ref Range   Sodium 134 (L) 135 - 145 mmol/L   Potassium 4.3 3.5 - 5.1 mmol/L   Chloride 98 (L) 101 - 111 mmol/L   CO2 25 22 - 32 mmol/L   Glucose, Bld 115 (H) 65 - 99 mg/dL   BUN 11 6 - 20 mg/dL   Creatinine, Ser 0.81 0.61 - 1.24 mg/dL   Calcium 9.1 8.9 - 10.3 mg/dL   Total Protein 6.6 6.5 - 8.1 g/dL   Albumin 2.9 (L) 3.5 - 5.0 g/dL   AST 36 15 - 41 U/L   ALT 40 17 - 63 U/L   Alkaline Phosphatase 120 38 - 126 U/L   Total Bilirubin 0.3 0.3 - 1.2 mg/dL   GFR calc non Af Amer >60 >60 mL/min   GFR calc Af Amer >60 >60 mL/min    Comment: (NOTE) The eGFR has been calculated using the CKD EPI equation. This calculation has not been validated in all clinical situations. eGFR's persistently <60 mL/min signify possible Chronic Kidney Disease.    Anion gap 11 5 - 15  Glucose, capillary      Status: Abnormal   Collection Time: 03/11/15  6:39 AM  Result Value Ref Range   Glucose-Capillary 145 (H) 65 - 99 mg/dL  Glucose, capillary     Status: Abnormal   Collection Time: 03/11/15 11:07 AM  Result Value Ref Range   Glucose-Capillary 136 (H) 65 - 99 mg/dL  Glucose, capillary     Status: Abnormal   Collection Time: 03/11/15  4:31 PM  Result Value Ref Range   Glucose-Capillary 132 (H) 65 - 99 mg/dL  Glucose, capillary     Status: None   Collection Time: 03/11/15  9:12 PM  Result Value Ref Range   Glucose-Capillary 87 65 - 99 mg/dL     HEENT: poor dentition Cardio: RRR and no murmurs Resp: CTA B/L and unlabored GI: BS positive and NT, ND Extremity:  Pulses positive and No Edema Skin:   Wound Left BK wound staples intact "dog ears" distal edema ,no drainage Neuro: Alert/Oriented and Abnormal Motor 4/5 Left HF, R HF, KE, ankle DF,  5/5 in BUE Musc/Skel:  Swelling distal stump edema, no drainage, staples intact Gen NAD   Assessment/Plan: 1. 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 Physiatrist is providing close team supervision and 24 hour management of active medical problems listed below. Physiatrist and rehab team continue to assess barriers to discharge/monitor patient progress toward functional and medical goals. FIM: FIM - Bathing Bathing Steps Patient Completed: Chest, Right lower leg (including foot), Right Arm, Left Arm, Abdomen, Front perineal area, Buttocks, Right upper leg, Left upper leg Bathing: 5: Supervision: Safety issues/verbal cues  FIM - Upper Body Dressing/Undressing Upper body dressing/undressing steps patient completed: Thread/unthread left sleeve of pullover shirt/dress, Put head through opening of pull over shirt/dress, Thread/unthread right sleeve of pullover shirt/dresss, Pull shirt over trunk Upper body dressing/undressing: 5:  Supervision: Safety issues/verbal cues FIM - Lower Body Dressing/Undressing Lower body dressing/undressing steps patient completed: Thread/unthread right underwear leg, Thread/unthread left underwear leg, Pull underwear up/down, Thread/unthread right pants leg, Thread/unthread left pants leg, Pull pants up/down Lower body dressing/undressing: 4: Min-Patient completed 75 plus % of tasks  FIM - Toileting Toileting steps completed  by patient: Adjust clothing prior to toileting, Performs perineal hygiene, Adjust clothing after toileting Toileting Assistive Devices: Grab bar or rail for support Toileting: 5: Supervision: Safety issues/verbal cues  FIM - Radio producer Devices: Elevated toilet seat, Grab bars Toilet Transfers: 4-To toilet/BSC: Min A (steadying Pt. > 75%), 4-From toilet/BSC: Min A (steadying Pt. > 75%)  FIM - Bed/Chair Transfer Bed/Chair Transfer Assistive Devices: Copy: 4: Bed > Chair or W/C: Min A (steadying Pt. > 75%), 5: Sit > Supine: Supervision (verbal cues/safety issues), 5: Supine > Sit: Supervision (verbal cues/safety issues), 4: Chair or W/C > Bed: Min A (steadying Pt. > 75%)  FIM - Locomotion: Wheelchair Distance: 150 Locomotion: Wheelchair: 2: Travels 42 - 149 ft with supervision, cueing or coaxing FIM - Locomotion: Ambulation Locomotion: Ambulation Assistive Devices: Administrator Ambulation/Gait Assistance: 4: Min guard Locomotion: Ambulation: 4: Travels 150 ft or more with minimal assistance (Pt.>75%)  Comprehension Comprehension Mode: Auditory Comprehension: 5-Follows basic conversation/direction: With extra time/assistive device  Expression Expression Mode: Verbal Expression: 5-Expresses basic needs/ideas: With extra time/assistive device  Social Interaction Social Interaction: 6-Interacts appropriately with others with medication or extra time (anti-anxiety, antidepressant).  Problem Solving Problem  Solving: 4-Solves basic 75 - 89% of the time/requires cueing 10 - 24% of the time  Memory Memory: 4-Recognizes or recalls 75 - 89% of the time/requires cueing 10 - 24% of the time  Medical Problem List and Plan: 1. 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 DVT Prophylaxis/Anticoagulation: Eliquis. Monitor for any bleeding episodes 3. Pain Management: Neurontin 300 mg twice a day, Oxycodone and Robaxin as needed. Monitor and increased mobility 4. Mood/depression: Wellbutrin 150 mg daily, Zoloft 100 mg daily. Provide emotional support 5. Neuropsych: This patient is capable of making decisions on his own behalf. 6. Skin/Wound Care: Routine skin checks, local care to amputation site- May SHower 7. Fluids/Electrolytes/Nutrition: Routine I&O with follow-up chemistries 8. Acute blood loss anemia. Follow-up CBC 9. Diabetes mellitus with peripheral neuropathy. Hemoglobin A1c 8.1. DiaBeta 5 mg twice a day, Glucophage 1000 mg twice a day. Check blood sugars before meals and at bedtime. Diabetic teaching 10. CAD. Continue Eliquis 11. Hypertension. Coreg 3.125 mg twice a day. Monitor with increased mobility 12. BPH. Flomax 0.4 mg daily. Check PVR 3  LOS (Days) 4 A FACE TO FACE EVALUATION WAS PERFORMED  KIRSTEINS,ANDREW E 03/12/2015, 7:34 AM

## 2015-03-12 NOTE — Progress Notes (Signed)
Occupational Therapy Session Note  Patient Details  Name: Eric Price MRN: [id number] Date of Birth: [birth year]  Today's Date: 03/12/2015 OT Individual Time: 1102-1202 OT Individual Time Calculation (min): 60 min    Short Term Goals: Week 1:  OT Short Term Goal 1 (Week 1): STG= LTG secondary to estimated short LOS  Skilled Therapeutic Interventions/Progress Updates:    Engaged in therapeutic activity with focus on pain management, residual limb wrapping, and BUE strengthening.  Pt ambulated approx 100 feet with RW with close supervision with 1 LOB when crossing threshold, reporting RW getting caught.  Engaged in mirror therapy with focus on desensitization and massage with pt reporting decrease in phantom pain with massage and tapping but that it would "move to another location".  Pt's residual limb damp from shower, rewrapped limb with fresh gauze wrap and ace wrap with education on figure eight wrapping and use of inspection mirror to check incision and then check wrapping for soft spots after therapist wrapped.  Pt's wife present and asking appropriate questions with mirror therapy and limb wrapping.  Engaged in [place] with 4# medicine ball with chest presses and PNF pattern reaching.  Ambulated approx 100 feet back to room with RW and close supervision.  Therapy Documentation Precautions:  Precautions Precautions: Fall Precaution Comments: no pillow under Knee to prevent flexion contracture Restrictions Weight Bearing Restrictions: Yes LLE Weight Bearing: Non weight bearing Pain: Pain Assessment Pain Assessment: 0-10 Pain Score: 4  Pain Type: Phantom pain Pain Location: Leg Pain Orientation: Left Pain Descriptors / Indicators: Aching Pain Frequency: Intermittent Pain Onset: On-going Pain Intervention(s): Repositioned (gabapentin administered)  See FIM for current functional status  Therapy/Group: Individual Therapy  [name] 03/12/2015, 12:06 PM

## 2015-03-13 ENCOUNTER — Inpatient Hospital Stay (HOSPITAL_COMMUNITY): Payer: 59 | Admitting: Physical Therapy

## 2015-03-13 ENCOUNTER — Inpatient Hospital Stay (HOSPITAL_COMMUNITY): Payer: 59 | Admitting: Occupational Therapy

## 2015-03-13 LAB — GLUCOSE, CAPILLARY
GLUCOSE-CAPILLARY: 250 mg/dL — AB (ref 65–99)
GLUCOSE-CAPILLARY: 251 mg/dL — AB (ref 65–99)
Glucose-Capillary: 119 mg/dL — ABNORMAL HIGH (ref 65–99)
Glucose-Capillary: 145 mg/dL — ABNORMAL HIGH (ref 65–99)
Glucose-Capillary: 106 mg/dL — ABNORMAL HIGH (ref 65–99)

## 2015-03-13 NOTE — Progress Notes (Signed)
Physical Therapy Session Note  Patient Details  Name: Eric Price MRN: [id number] Date of Birth: 11-30-1950  Today's Date: 03/13/2015 PT Individual Time: 1000-1045 PT Individual Time Calculation (min): 45 min   Short Term Goals: Week 1:  PT Short Term Goal 1 (Week 1): STG=LTG   Therapy Documentation Precautions:  Precautions Precautions: Fall Precaution Comments: no pillow under Knee to prevent flexion contracture Restrictions Weight Bearing Restrictions: Yes LLE Weight Bearing: Non weight bearing  Patient received from [place] seated in wheelchair. Patient reports no pain or discomfort throughout entire session.  Patient ambulated 50 feetx3 with RW supervision over outdoor uneven and unlevel surfaces. Verbal cues required for environmental awareness and changes in terrain.  Patient up and up 6 inch curb outside with contact guard for steadying assist when descending the curb. Ascending curb close supervision. Verbal cues for rolling walker placement and technique.  Patient up and down 4 (6 inch) steps x3 with left handrail initially min assist however progressed to contact guard on last trial. Patient required verbal cues throughout for proper sequencing and technique.   Patient educated on residual limb care and inspection, desensitization techniques, and figure 8 wrapping. Patient provided demonstration on residual limb wrapping and declined to participate will attempt next session.  Removed old dressing scant drainage noted distal aspect of resdiual limb. Patient provided nonadherent dressing, kerlix and ace wrap for residual limb shaping and care. RN Maudry Mayhew notified and aware.    Patient resting in comfortably in wheelchair at end of session with all needs met.   See FIM for current functional status  Therapy/Group: Individual Therapy  Retta Diones 03/13/2015, 11:03 AM

## 2015-03-13 NOTE — Progress Notes (Signed)
Social Work Patient ID: Eric Price, male   DOB: Oct 02, 1951, 64 y.o.   MRN: [id number] Met with pt and wife to inform team conference goals-mod/i level and discharge Sat 6/11. Both feel he will be ready and wife has been here to participate in therapies with pt. Discussed equipment and follow up therapies, aware peer support person to contact him. He is still struggling with the phatnom pain he is having.  He feels if it was not for this he Would be doing much better. Will work toward discharge Sat.

## 2015-03-13 NOTE — Progress Notes (Signed)
Physical Therapy Session Note  Patient Details  Name: Eric Price MRN: [id number] Date of Birth: 1951/10/05  Today's Date: 03/13/2015 PT Individual Time: 0900-1000 PT Individual Time Calculation (min): 60 min   Short Term Goals: Week 1:  PT Short Term Goal 1 (Week 1): STG=LTG  Skilled Therapeutic Interventions/Progress Updates:    Pt received in w/c; reports pain as described below, agreeable to treatment. Pt instructed in bed <-> w/c transfer with modI and no safety issues. Pt able to correctly setup and lock w/c and transfer with no cues needed; also able to identify and problem solve any issues with transfers (moving L leg rest, turning easiest direction). Pt instructed in car transfer x2 trials to low and high seat in simulated car (max height 28") with supervision due to pt report of owning two vehicles of varying height. Required verbal cues to problem solve transfer setup and instruction in removal of L leg rest prior to setup. Pt instructed in stand step transfer w/c <-> toilet with RW and supervision due to impaired balance. Performed all hygiene and clothing management without assistance. Pt instructed in gait with RW and supervision for community distances of >250' and seated rest breaks as needed. Performed management of RW in narrow walk ways and on elevators. Pt returned to room and seated in w/c with next therapist present for treatment session.   Therapy Documentation Precautions:  Precautions Precautions: Fall Precaution Comments: no pillow under Knee to prevent flexion contracture Restrictions Weight Bearing Restrictions: Yes LLE Weight Bearing: Non weight bearing Pain: Pain Assessment Pain Assessment: 0-10 Pain Score: 6  Pain Type: Phantom pain Pain Location: Leg Pain Orientation: Left Pain Descriptors / Indicators: Aching Pain Onset: Gradual Patients Stated Pain Goal: 3 Pain Intervention(s): Distraction;Repositioned Multiple Pain Sites: No Locomotion  : Ambulation Ambulation/Gait Assistance: 5: Supervision Wheelchair Mobility Distance: 150   See FIM for current functional status  Therapy/Group: Individual Therapy  Luberta Mutter 03/13/2015, 10:10 AM

## 2015-03-13 NOTE — Plan of Care (Signed)
Problem: RH Toilet Transfers Goal: LTG Patient will perform toilet transfers w/assist (OT) LTG: Patient will perform toilet transfers with assist, with/without cues using equipment (OT)  Downgraded due to recommending supervision with transfers from w/c and RW level  Problem: RH Tub/Shower Transfers Goal: LTG Patient will perform tub/shower transfers w/assist (OT) LTG: Patient will perform tub/shower transfers with assist, with/without cues using equipment (OT)  Pt will require supervision/setup for shower transfer

## 2015-03-13 NOTE — Patient Care Conference (Signed)
Inpatient RehabilitationTeam Conference and Plan of Care Update Date: 03/13/2015   Time: 10;45 AM    Patient Name: Eric Price      Medical Record Number: [id number]  Date of Birth: 27-Nov-1950 Sex: Male         Room/Bed: 4M07C/4M07C-01 Payor Info: Payor: Theme park manager / Plan: Theme park manager [employer]VIGATE / Product Type: *No Product type* /    Admitting Diagnosis: L BKA  Admit Date/Time:  03/08/2015  4:41 PM Admission Comments: No comment available   Primary Diagnosis:  Status post below knee amputation of left lower extremity Principal Problem: Status post below knee amputation of left lower extremity  Patient Active Problem List   Diagnosis Date Noted  . Status post below knee amputation of left lower extremity 03/08/2015  . History of left below knee amputation 03/08/2015  . Ischemic toe 03/05/2015  . Pain in joint, ankle and foot 02/28/2015  . Wound drainage-Left foot 02/28/2015  . Cold sensation of skin-Left foot 02/28/2015  . Non-ischemic cardiomyopathy: EF ~30-25% 02/22/2015  . Coronary artery disease, non-occlusive: mod LAD, ~60-70& dRCA 02/22/2015  . Embolic disease of toe 26/33/3545  . Ischemic ulcer of toe of left foot 02/22/2015  . Diabetic foot ulcer 02/21/2015  . Critical lower limb ischemia 02/19/2015  . Poorly controlled type 2 diabetes mellitus 09/19/2014  . Hereditary and idiopathic peripheral neuropathy 09/07/2014  . 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 Subacromial bursitis 06/19/2014  . Primary localized osteoarthrosis, lower leg 06/19/2014  . Low back pain 05/28/2014  . Acromioclavicular joint arthritis 05/28/2014  . Dental decay 05/25/2014  . Major depressive disorder, recurrent episode 12/11/2013  . Obesity (BMI 30-39.9) 10/31/2013  . Chest pain 10/20/2013  . Stable angina 10/19/2013  . Diabetes mellitus type 2, uncontrolled 10/19/2013  . BPH (benign prostatic hyperplasia) 10/11/2013  . Dyspnea 06/30/2013  . Mycosis fungoides   . Type 2 diabetes  mellitus 07/13/2012  . History of depression 07/13/2012  . Essential hypertension 07/13/2012  . Hyperlipidemia 07/13/2012    Expected Discharge Date: Expected Discharge Date: 03/16/15  Team Members Present: Physician leading conference: Dr. Alysia Penna Social Worker Present: Ovidio Kin, LCSW Nurse Present: Heather Roberts, RN PT Present: Raylene Everts, PT;Caroline Lacinda Axon, PT;Other (comment) Rudene Christians Tygielski-PT) OT Present: Meriel Pica, Jules Schick, OT SLP Present: Windell Moulding, SLP PPS Coordinator present : Daiva Nakayama, RN, CRRN     Current Status/Progress Goal Weekly Team Focus  Medical   Still working on stairs, upper body is strong  Home d/c with wife  D/C planning   Bowel/Bladder   continent of bowel and bladder  Remain continent of bowel and bladder with min assist  Offer toileting Q2 hrs and prn   Swallow/Nutrition/ Hydration     na        ADL's   supervision transfers and self-care tasks, total assist residual limb wrapping  Mod I overall, supervision with transfers with RW  transfers, DME, residual limb wrapping, pain management, BUE strengthening, pt/family education   Mobility   supervision/standbyA transfers, w/c propulsion, and gait, minA stairs  modI transfers, balance, and w/c propulsion, gait in home with supervision  Stairs, endurance, gait with RW, BLE strength and ROM   Communication     na        Safety/Cognition/ Behavioral Observations    no unsafe behaviors        Pain   Phantom pain in left leg. Maintain pain with percocet and oxy IR Q4 hours.   Maintain pain at less than 3 on  a scale of 1 to 10   Assess and treat pain frequently.    Skin   Left BKA. Staples, kerlix, and ace bandage. Incision and dressing CDI.   Prevent skin breakdown.   Assess skin Qshift and prn    Rehab Goals Patient on target to meet rehab goals: Yes *See Care Plan and progress notes for long and short-term goals.  Barriers to Discharge: Has steps to enter home , railings  are far apart    Possible Resolutions to Barriers:  hopping technique up steps    Discharge Planning/Teaching Needs:  HOme with wife who is off for the summer and can assist just no heavy lifting. Here daily observing in therapies      Team Discussion:  Goals-mod/i level-supervision with ambulation. Phatnom pain an issue.  Pushing through in therapies.  Wants peer support to talk with. Will educate wife while here for discharge Sat.   Revisions to Treatment Plan:  None   Continued Need for Acute Rehabilitation Level of Care: The patient requires daily medical management by a physician with specialized training in physical medicine and rehabilitation for the following conditions: Daily direction of a multidisciplinary physical rehabilitation program to ensure safe treatment while eliciting the highest outcome that is of practical value to the patient.: Yes Daily medical management of patient stability for increased activity during participation in an intensive rehabilitation regime.: Yes Daily analysis of laboratory values and/or radiology reports with any subsequent need for medication adjustment of medical intervention for : Neurological problems;Other  Elease Hashimoto 03/13/2015, 12:37 PM

## 2015-03-13 NOTE — Progress Notes (Signed)
Subjective/Complaints: Phantom pain /burning left foot,now becoming day and night. Patient working on transfers with PT, has 5 steps to enter in home, 2 handrails but they are too widely spaced to grab onto both of metastases same time ROS- negative breathing issues, neg bowel or bladder issues  Objective: Vital Signs: Blood pressure 150/73, pulse 95, temperature 98.5 F (36.9 C), temperature source Oral, resp. rate 18, height 5' 9" (1.753 m), weight 89 kg (196 lb 3.4 oz), SpO2 97 %. No results found. Results for orders placed or performed during the hospital encounter of 03/08/15 (from the past 72 hour(s))  Glucose, capillary     Status: Abnormal   Collection Time: 03/10/15  4:45 PM  Result Value Ref Range   Glucose-Capillary 104 (H) 65 - 99 mg/dL   Comment 1 Notify RN   Glucose, capillary     Status: Abnormal   Collection Time: 03/10/15  8:48 PM  Result Value Ref Range   Glucose-Capillary 129 (H) 65 - 99 mg/dL  CBC WITH DIFFERENTIAL     Status: Abnormal   Collection Time: 03/11/15  6:00 AM  Result Value Ref Range   WBC 8.9 4.0 - 10.5 K/uL   RBC 4.05 (L) 4.22 - 5.81 MIL/uL   Hemoglobin 11.4 (L) 13.0 - 17.0 g/dL   HCT 33.5 (L) 39.0 - 52.0 %   MCV 82.7 78.0 - 100.0 fL   MCH 28.1 26.0 - 34.0 pg   MCHC 34.0 30.0 - 36.0 g/dL   RDW 12.4 11.5 - 15.5 %   Platelets 501 (H) 150 - 400 K/uL   Neutrophils Relative % 69 43 - 77 %   Neutro Abs 6.1 1.7 - 7.7 K/uL   Lymphocytes Relative 20 12 - 46 %   Lymphs Abs 1.8 0.7 - 4.0 K/uL   Monocytes Relative 9 3 - 12 %   Monocytes Absolute 0.8 0.1 - 1.0 K/uL   Eosinophils Relative 2 0 - 5 %   Eosinophils Absolute 0.2 0.0 - 0.7 K/uL   Basophils Relative 0 0 - 1 %   Basophils Absolute 0.0 0.0 - 0.1 K/uL  Comprehensive metabolic panel     Status: Abnormal   Collection Time: 03/11/15  6:00 AM  Result Value Ref Range   Sodium 134 (L) 135 - 145 mmol/L   Potassium 4.3 3.5 - 5.1 mmol/L   Chloride 98 (L) 101 - 111 mmol/L   CO2 25 22 - 32 mmol/L    Glucose, Bld 115 (H) 65 - 99 mg/dL   BUN 11 6 - 20 mg/dL   Creatinine, Ser 0.81 0.61 - 1.24 mg/dL   Calcium 9.1 8.9 - 10.3 mg/dL   Total Protein 6.6 6.5 - 8.1 g/dL   Albumin 2.9 (L) 3.5 - 5.0 g/dL   AST 36 15 - 41 U/L   ALT 40 17 - 63 U/L   Alkaline Phosphatase 120 38 - 126 U/L   Total Bilirubin 0.3 0.3 - 1.2 mg/dL   GFR calc non Af Amer >60 >60 mL/min   GFR calc Af Amer >60 >60 mL/min    Comment: (NOTE) The eGFR has been calculated using the CKD EPI equation. This calculation has not been validated in all clinical situations. eGFR's persistently <60 mL/min signify possible Chronic Kidney Disease.    Anion gap 11 5 - 15  Glucose, capillary     Status: Abnormal   Collection Time: 03/11/15  6:39 AM  Result Value Ref Range   Glucose-Capillary 145 (H) 65 - 99  mg/dL  Glucose, capillary     Status: Abnormal   Collection Time: 03/11/15 11:07 AM  Result Value Ref Range   Glucose-Capillary 136 (H) 65 - 99 mg/dL  Glucose, capillary     Status: Abnormal   Collection Time: 03/11/15  4:31 PM  Result Value Ref Range   Glucose-Capillary 132 (H) 65 - 99 mg/dL  Glucose, capillary     Status: None   Collection Time: 03/11/15  9:12 PM  Result Value Ref Range   Glucose-Capillary 87 65 - 99 mg/dL  Glucose, capillary     Status: Abnormal   Collection Time: 03/12/15  6:45 AM  Result Value Ref Range   Glucose-Capillary 130 (H) 65 - 99 mg/dL  Glucose, capillary     Status: Abnormal   Collection Time: 03/12/15 10:54 AM  Result Value Ref Range   Glucose-Capillary 182 (H) 65 - 99 mg/dL  Glucose, capillary     Status: Abnormal   Collection Time: 03/12/15  4:10 PM  Result Value Ref Range   Glucose-Capillary 121 (H) 65 - 99 mg/dL  Glucose, capillary     Status: None   Collection Time: 03/12/15  9:17 PM  Result Value Ref Range   Glucose-Capillary 94 65 - 99 mg/dL   Comment 1 Notify RN   Glucose, capillary     Status: Abnormal   Collection Time: 03/13/15  6:50 AM  Result Value Ref Range    Glucose-Capillary 119 (H) 65 - 99 mg/dL  Glucose, capillary     Status: Abnormal   Collection Time: 03/13/15 11:26 AM  Result Value Ref Range   Glucose-Capillary 250 (H) 65 - 99 mg/dL  Glucose, capillary     Status: Abnormal   Collection Time: 03/13/15 11:28 AM  Result Value Ref Range   Glucose-Capillary 251 (H) 65 - 99 mg/dL     HEENT: poor dentition Cardio: RRR and no murmurs Resp: CTA B/L and unlabored GI: BS positive and NT, ND Extremity:  Pulses positive and No Edema Skin:   Wound Left BK wound staples intact minimal edema ,no drainage Neuro: Alert/Oriented and Abnormal Motor 4/5 Left HF, R HF, KE, ankle DF,  5/5 in BUE Musc/Skel:  Swelling distal stump edema, no drainage, staples intact Gen NAD   Assessment/Plan: 1. 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 Physiatrist is providing close team supervision and 24 hour management of active medical problems listed below. Physiatrist and rehab team continue to assess barriers to discharge/monitor patient progress toward functional and medical goals. FIM: FIM - Bathing Bathing Steps Patient Completed: Chest, Right Arm, Left Arm, Abdomen, Front perineal area, Buttocks, Right upper leg, Left upper leg, Right lower leg (including foot) Bathing: 6: More than reasonable amount of time  FIM - Upper Body Dressing/Undressing Upper body dressing/undressing steps patient completed: Put head through opening of pull over shirt/dress, Thread/unthread right sleeve of pullover shirt/dresss, Thread/unthread left sleeve of pullover shirt/dress, Pull shirt over trunk Upper body dressing/undressing: 6: Assistive device (Comment) FIM - Lower Body Dressing/Undressing Lower body dressing/undressing steps patient completed: Thread/unthread right underwear leg, Thread/unthread left underwear leg, Pull underwear up/down, Thread/unthread right  pants leg, Thread/unthread left pants leg, Pull pants up/down, Don/Doff right shoe, Fasten/unfasten right shoe, Fasten/unfasten pants Lower body dressing/undressing: 6: More than reasonable amount of time  FIM - Toileting Toileting steps completed by patient: Adjust clothing prior to toileting, Performs perineal hygiene, Adjust clothing after toileting Toileting Assistive Devices:  Grab bar or rail for support Toileting: 5: Supervision: Safety issues/verbal cues  FIM - Radio producer Devices: Elevated toilet seat, Grab bars Toilet Transfers: 5-To toilet/BSC: Supervision (verbal cues/safety issues), 5-From toilet/BSC: Supervision (verbal cues/safety issues)  FIM - Control and instrumentation engineer Devices: Arm rests Bed/Chair Transfer: 6: Assistive device: no helper, 7: Sit > Supine: No assist, 6: Bed > Chair or W/C: No assist, 6: Chair or W/C > Bed: No assist  FIM - Locomotion: Wheelchair Distance: 150 Locomotion: Wheelchair: 5: Travels 150 ft or more: maneuvers on rugs and over door sills with supervision, cueing or coaxing FIM - Locomotion: Ambulation Locomotion: Ambulation Assistive Devices: Administrator Ambulation/Gait Assistance: 5: Supervision Locomotion: Ambulation: 5: Travels 150 ft or more with supervision/safety issues  Comprehension Comprehension Mode: Auditory Comprehension: 5-Follows basic conversation/direction: With extra time/assistive device  Expression Expression Mode: Verbal Expression: 5-Expresses basic needs/ideas: With extra time/assistive device  Social Interaction Social Interaction: 6-Interacts appropriately with others with medication or extra time (anti-anxiety, antidepressant).  Problem Solving Problem Solving: 4-Solves basic 75 - 89% of the time/requires cueing 10 - 24% of the time  Memory Memory: 4-Recognizes or recalls 75 - 89% of the time/requires cueing 10 - 24% of the time  Medical Problem List  and Plan: 1. 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 DVT Prophylaxis/Anticoagulation: Eliquis. Monitor for any bleeding episodes 3. Pain Management: Neurontin 300 mg Will increase to 3 times a day Oxycodone and Robaxin as needed. Monitor and increased mobility 4. Mood/depression: Wellbutrin 150 mg daily, Zoloft 100 mg daily. Provide emotional support 5. Neuropsych: This patient is capable of making decisions on his own behalf. 6. Skin/Wound Care: Routine skin checks, local care to amputation site- May Shower 7. Fluids/Electrolytes/Nutrition: Routine I&O with follow-up chemistries 8. Acute blood loss anemia. Follow-up CBC 9. Diabetes mellitus with peripheral neuropathy. Hemoglobin A1c 8.1. DiaBeta 5 mg twice a day, Glucophage 1000 mg twice a day. Check blood sugars before meals and at bedtime. Diabetic teaching 10. CAD. Continue Eliquis 11. Hypertension. Coreg 3.125 mg twice a day. Monitor with increased mobility 12. BPH. Flomax 0.4 mg daily. Check PVR 3  LOS (Days) 5 A FACE TO FACE EVALUATION WAS PERFORMED  , E 03/13/2015, 1:27 PM

## 2015-03-13 NOTE — Progress Notes (Signed)
Occupational Therapy Session Note  Patient Details  Name: Eric Price MRN: [id number] Date of Birth: Jun 30, 1951  Today's Date: 03/13/2015 OT Individual Time: 8177-1165 and 1430-1500 OT Individual Time Calculation (min): 60 min and 30 min   Short Term Goals: Week 1:  OT Short Term Goal 1 (Week 1): STG= LTG secondary to estimated short LOS  Skilled Therapeutic Interventions/Progress Updates:    1) Engaged in ADL retraining with focus on sit > stand, standing tolerance, and functional transfers at [place] and w/c level.  Pt transferred bed > w/c with supervision/setup then gathered all clothing and bathing supplies at w/c level.  Bathing completed at sit > stand level at sink at Mod I level demonstrating good standing tolerance for self-care tasks.  Engaged in toilet transfer training from w/c level and with RW both at supervision level, discussed bathroom setup with recommendation of BSC over toilet.  Pt reports having high bed at home, performed stand pivot transfer bed <> w/c with close supervision to high bed, plan to continue training and plan to attempt with RW.   2) Engaged in hands on education with wife with focus on transfers and residual limb wrapping.  Wife provided supervision for pt with transfer to/from toilet at w/c level and with RW with ability to provide cues as needed without additional cues from therapist.  Wife checked off to provide supervision with transfers bed <> w/c and w/c <> toilet, recommending use of w/c at this time with toilet transfers due to thresholds in bathroom.  Educated wife on limb wrapping with having her return demonstrate wrapping technique.  Wife required mod cues for technique, with pt able to utilize inspection mirror to check behind wife.  Both will require additional practice with limb wrapping.  Wife did not bring pictures of shower, plan to problem solve shower transfers tomorrow with wife to bring pictures.  Therapy Documentation Precautions:   Precautions Precautions: Fall Precaution Comments: no pillow under Knee to prevent flexion contracture Restrictions Weight Bearing Restrictions: Yes LLE Weight Bearing: Non weight bearing Pain: Pain Assessment Pain Assessment: 0-10 Pain Score: 6  Pain Type: Phantom pain Pain Location: Leg Pain Orientation: Left Pain Descriptors / Indicators: Aching Pain Onset: Gradual Patients Stated Pain Goal: 3 Pain Intervention(s): Distraction;Repositioned Multiple Pain Sites: No  See FIM for current functional status  Therapy/Group: Individual Therapy  [name] 03/13/2015, 10:40 AM

## 2015-03-14 ENCOUNTER — Inpatient Hospital Stay (HOSPITAL_COMMUNITY): Payer: 59 | Admitting: Occupational Therapy

## 2015-03-14 ENCOUNTER — Inpatient Hospital Stay (HOSPITAL_COMMUNITY): Payer: 59 | Admitting: Physical Therapy

## 2015-03-14 ENCOUNTER — Encounter (HOSPITAL_COMMUNITY): Payer: 59

## 2015-03-14 DIAGNOSIS — R413 Other amnesia: Secondary | ICD-10-CM

## 2015-03-14 LAB — GLUCOSE, CAPILLARY
GLUCOSE-CAPILLARY: 114 mg/dL — AB (ref 65–99)
GLUCOSE-CAPILLARY: 82 mg/dL (ref 65–99)
Glucose-Capillary: 159 mg/dL — ABNORMAL HIGH (ref 65–99)
Glucose-Capillary: 66 mg/dL (ref 65–99)
Glucose-Capillary: 129 mg/dL — ABNORMAL HIGH (ref 65–99)

## 2015-03-14 MED ORDER — GABAPENTIN 400 MG PO CAPS
400.0000 mg | ORAL_CAPSULE | Freq: Three times a day (TID) | ORAL | Status: DC
  Administered 2015-03-14 – 2015-03-16 (×6): 400 mg via ORAL
  Filled 2015-03-14 (×11): qty 1

## 2015-03-14 NOTE — Progress Notes (Signed)
Social Work Patient ID: Eric Price, male   DOB: 1950-12-05, 64 y.o.   MRN: [id number] Met with pt and wife to discuss follow up services.  They had [facility] prior to admission and were not happy with have decided to change home health agencies. Pref to use [facility] for PT & OT follow up. Have contacted [facility] to inform of the change in agencies. Wife going through education today in anticipation of discharge Sat.

## 2015-03-14 NOTE — Discharge Summary (Signed)
NAMEBRAYLYN, Eric Price NO.:  [id number]  MEDICAL RECORD NO.:  [id number]  LOCATION:  [id number]                        FACILITY:  [facility]  PHYSICIAN:  Charlett Blake, M.D.DATE OF BIRTH:  March 14, 1951  DATE OF ADMISSION:  03/06/2015 DATE OF DISCHARGE:  03/16/2015                              DISCHARGE SUMMARY   DISCHARGE DIAGNOSES: 1. Functional deficits secondary to left below-knee amputation on Mar 05, 2015. 2. Pain management. 3. Depression. 4. Hypertension with atrial fibrillation. 5. Diabetes mellitus, peripheral neuropathy. 6. Acute blood loss anemia. 7. Coronary artery disease. 8. Benign prostatic hyperplasia.  HISTORY OF PRESENT ILLNESS:  This is a 64 year old right-handed male with history of hypertension and coronary artery disease, maintained on Eliquis,  independent with a cane prior to admission living with his wife.  Presented on Mar 05, 2015, with ischemic left foot secondary to peripheral vascular disease with recent left great toe amputation. Noted progressive gangrenous changes of left foot and limb was not felt to be salvageable.  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 Oneida Alar.  Hospital course pain management.  Physical and occupational therapy ongoing.  The patient was admitted for comprehensive rehab program.  PAST MEDICAL HISTORY:  See discharge diagnoses.  SOCIAL HISTORY:  Lives with his wife, independent with a cane prior to admission.  Functional status upon admission to rehab services was +2 sit to stand, supine to sit; min to mod assist for activities of daily living.  PHYSICAL EXAMINATION:  VITAL SIGNS:  Blood pressure 151/84, pulse 109, temperature 98, respirations 18. GENERAL:  This was an alert male, oriented x3. LUNGS:  Clear auscultation. CARDIAC:  Rate controlled. ABDOMEN:  Soft, nontender.  Good bowel sounds. EXTREMITIES:  Amputated limb site was dressed.  No signs of drainage.  REHABILITATION  HOSPITAL COURSE:  The patient was admitted to inpatient rehab services with therapies initiated on a 3-hour daily basis consisting of physical therapy, occupational therapy, and rehabilitation nursing.  The following issues were addressed during the patient's rehabilitation stay.  Pertaining Mr. Brossard left below-knee amputation, surgical site healing nicely.  He would follow up Dr. Oneida Alar.  He remained on Eliquis for history of coronary artery disease.  No chest pain or shortness of breath.  Pain management, use of oxycodone, Robaxin, and his Neurontin was titrated.  He did have a hemoglobin A1c of 8.1, he remained on diabetic Glucophage, full diabetic teaching. Blood pressure controlled, no orthostatic changes.  He was maintained on Flomax for BPH, postvoid residuals all were good.  The patient received weekly collaborative interdisciplinary team conferences to discuss estimated length of stay, family teaching, and any barriers to his discharge.  He could ambulate 50 feet x3 rolling walker supervision over outdoor on even and un-level surfaces.  Some cues for environmental awareness.  Navigates stairs, curbs, contact guard to gather his belongings for activities of daily living and homemaking.  He engaged hands-on and full education as well as his wife.  Full family teaching was completed and plan discharge to home.  DISCHARGE MEDICATIONS: 1. Eliquis 5 mg p.o. b.i.d. 2. Lipitor 60 mg p.o. daily. 3. Wellbutrin 150 mg p.o. daily. 4.  Coreg 3.125 mg p.o. b.i.d. 5. Temovate solution ointment twice daily to affected areas. 6. Colace 100 mg p.o. daily. 7. Fenofibrate 54 mg p.o. daily. 8. Neurontin 400 mg p.o. t.i.d. 9. DiaBeta 5 mg p.o. b.i.d. 10.Glucophage 1000 mg p.o. b.i.d. 11.Robaxin 500 mg every 6 hours as needed muscle spasms. 12.Nitroglycerin as needed. 13.Oxycodone 1-2 tablets every 4 hours as needed for pain, dispense of     #90 tablets. 14.Protonix 40 mg p.o.  daily. 15.Zoloft 100 mg p.o. daily. 16.Flomax 0.4 mg p.o. daily.  DIET:  Diabetic diet.  FOLLOWUP:  The patient would follow up with Dr. Alysia Penna at the outpatient rehab center as directed; Dr. Ruta Hinds, 2 weeks call for appointment as well as Medical Management, Dr. Carolann Littler.     Lauraine Rinne, P.A.   ______________________________ Charlett Blake, M.D.    DA/MEDQ  D:  03/14/2015  T:  03/14/2015  Job:  [id number]  cc:   Carolann Littler, M.D.

## 2015-03-14 NOTE — Progress Notes (Signed)
Hypoglycemic Event  CBG: 66 at 1621  Treatment: 15 GM carbohydrate snack  Symptoms: None  Follow-up CBG: Time:1649 CBG Result:82  Possible Reasons for Event: Unknown  Comments/MD notified:P . Love, PA notified    Mliss Sax  Remember to initiate Hypoglycemia Order Set & complete

## 2015-03-14 NOTE — Progress Notes (Signed)
Physical Therapy Session Note  Patient Details  Name: Eric Price MRN: [id number] Date of Birth: 1951/08/04  Today's Date: 03/14/2015 PT Individual Time: 1000-1100 PT Individual Time Calculation (min): 60 min   Short Term Goals: Week 1:  PT Short Term Goal 1 (Week 1): STG=LTG  Skilled Therapeutic Interventions/Progress Updates:    Pt received seated in w/c with nursing staff present. Reports phantom pain in LLE as described below; pt was pre-medicated prior to session and TENS utilized at end of session for pain management. Pt instructed in w/c propulsion indoors/outdoors on level/unlevel surfaces including incline/decline and management or elevator; performed with supervision/modI. Pt instructed in car transfer from low and high simulated car with max height 32" due to wife report of high seat in one vehicle they own. Pt performed with modI and no cues needed for footrest management or positioning. Pt instructed in ascent/descent of 4 6-inch steps with minA/CGA for 1 trial with therapist. Wife educated in safe guarding techniques and need for slow ascent/descent by pt to assure wife can safely reposition. Wife and pt performed 4 steps with wife providing minA/CGA. One instance of pt poor R foot placement, however pt identified problem and self-corrected with no cues. Pt and wife state feeling comfortable with stair performance, however would like one more session to practice prior to d/c. Estim applied to L residual limb with two channels (anterior/posterior) on pre-set knee setting (continuous, 100 pps, 12 sec cycle, 300us); anterior channel set to 64 mA, posterior channel set to 52mA to provide strong but comfortable stimulus. Pt reports dec in pain and 1 episode of discomfort/itch throughout trial. Educated pt and wife on role of TENS for pain modulation. Skin inspected before/after treatment WNL. Electrodes removed at completion of session, and residual limb wrapped by PT to promote reduction in  swelling and consistent limb size. Pt remained seated in recliner with wife present and all needs within reach.   Therapy Documentation Precautions:  Precautions Precautions: Fall Precaution Comments: no pillow under Knee to prevent flexion contracture Restrictions Weight Bearing Restrictions: Yes LLE Weight Bearing: Non weight bearing Pain: Pain Assessment Pain Assessment: No/denies pain Pain Score: 6  Pain Type: Phantom pain Pain Location: Leg Pain Orientation: Left Pain Descriptors / Indicators: Aching Pain Onset: Gradual Patients Stated Pain Goal: 2 Pain Intervention(s): TENS Multiple Pain Sites: No Locomotion : Ambulation Ambulation/Gait Assistance: 5: Supervision Wheelchair Mobility Distance: 250   See FIM for current functional status  Therapy/Group: Individual Therapy  Luberta Mutter 03/14/2015, 12:31 PM

## 2015-03-14 NOTE — Progress Notes (Signed)
Occupational Therapy Session Note  Patient Details  Name: Eric Price MRN: [id number] Date of Birth: 1951/09/05  Today's Date: 03/14/2015 OT Individual Time: 4098-1191 and 1300-1400 OT Individual Time Calculation (min): 75 min and 60 min   Short Term Goals: Week 1:  OT Short Term Goal 1 (Week 1): STG= LTG secondary to estimated short LOS  Skilled Therapeutic Interventions/Progress Updates:    1) Engaged in ADL retraining with focus on functional mobility, transfers, residual limb wrapping and inspection, and increased independence with bathing and dressing tasks.  Pt ambulated to room shower with RW and supervision with maneuvering RW over thresholds and into shower.  Bathing and dressing completed at overall Mod I at sit > stand level with UE support.  Educated on residual limb wrapping with pt utilizing inspection mirror to inspect incision.  Therapist completed limb wrapping in figure 8 pattern with education on technique with pt observing.  Pt's wife arrived during session with pictures of bathroom layout to problem solve shower access.  Plan to further address during next session.  Pt will require shower chair, grab bars, and hand held shower head.  2) Engaged in therapeutic activity with focus on problem solving bathroom accessibility and home bed height and engaging in pain management with mirror therapy.  In ADL apt setup simulated walk-in shower similar to pt's walk-in shower with use of tub bench over outside ledge of shower to eliminate need to "hop" over ledge.  Pt completed transfers with RW with supervision with ease of transfer in/out of simulated shower.  Discussed use of BSC over toilet to complete dressing at sit > stand level prior to exiting bathroom, had pt go through transfers from shower to [facility] set up similar to home with pt and wife reporting feeling good about this setup. Discussed recommendation of supervision with shower and toilet transfers.  Ambulated to therapy gym and  engaged in bed mobility with therapy mat raised up to simulate home bed height with pt completing transfers and bed mobility and overall Mod I level.  Engaged in mirror therapy with pt reporting feeling good and pain subsiding during imagery and massage of RLE.  Returned to room and left seated up in w/c with wife present.  Therapy Documentation Precautions:  Precautions Precautions: Fall Precaution Comments: no pillow under Knee to prevent flexion contracture Restrictions Weight Bearing Restrictions: Yes LLE Weight Bearing: Non weight bearing General:   Vital Signs: Therapy Vitals Temp: 98.6 F (37 C) Temp Source: Oral Pulse Rate: 90 Resp: 18 BP: 140/82 mmHg Patient Position (if appropriate): Lying Oxygen Therapy SpO2: 100 % O2 Device: Not Delivered Pain: Pain Assessment Pain Assessment: 0-10 Pain Score: 6  Pain Type: Phantom pain Pain Location: Leg Pain Descriptors / Indicators: Aching Pain Intervention(s): RN made aware  See FIM for current functional status  Therapy/Group: Individual Therapy  [name] 03/14/2015, 9:27 AM

## 2015-03-14 NOTE — Discharge Summary (Signed)
Discharge summary job # [id number]

## 2015-03-14 NOTE — Progress Notes (Signed)
Subjective/Complaints: Phantom pain /burning left foot,now becoming day and night.Night time symptoms worse than daytime, also has occ stump pain Participating well in ADL program, wife coming in to do family training  ROS- negative cough or SOB, neg bowel or bladder issues  Objective: Vital Signs: Blood pressure 140/82, pulse 90, temperature 98.6 F (37 C), temperature source Oral, resp. rate 18, height 5\' 9"  (1.753 m), weight 89 kg (196 lb 3.4 oz), SpO2 100 %. No results found. Results for orders placed or performed during the hospital encounter of 03/08/15 (from the past 72 hour(s))  Glucose, capillary     Status: Abnormal   Collection Time: 03/11/15 11:07 AM  Result Value Ref Range   Glucose-Capillary 136 (H) 65 - 99 mg/dL  Glucose, capillary     Status: Abnormal   Collection Time: 03/11/15  4:31 PM  Result Value Ref Range   Glucose-Capillary 132 (H) 65 - 99 mg/dL  Glucose, capillary     Status: None   Collection Time: 03/11/15  9:12 PM  Result Value Ref Range   Glucose-Capillary 87 65 - 99 mg/dL  Glucose, capillary     Status: Abnormal   Collection Time: 03/12/15  6:45 AM  Result Value Ref Range   Glucose-Capillary 130 (H) 65 - 99 mg/dL  Glucose, capillary     Status: Abnormal   Collection Time: 03/12/15 10:54 AM  Result Value Ref Range   Glucose-Capillary 182 (H) 65 - 99 mg/dL  Glucose, capillary     Status: Abnormal   Collection Time: 03/12/15  4:10 PM  Result Value Ref Range   Glucose-Capillary 121 (H) 65 - 99 mg/dL  Glucose, capillary     Status: None   Collection Time: 03/12/15  9:17 PM  Result Value Ref Range   Glucose-Capillary 94 65 - 99 mg/dL   Comment 1 Notify RN   Glucose, capillary     Status: Abnormal   Collection Time: 03/13/15  6:50 AM  Result Value Ref Range   Glucose-Capillary 119 (H) 65 - 99 mg/dL  Glucose, capillary     Status: Abnormal   Collection Time: 03/13/15 11:26 AM  Result Value Ref Range   Glucose-Capillary 250 (H) 65 - 99 mg/dL   Glucose, capillary     Status: Abnormal   Collection Time: 03/13/15 11:28 AM  Result Value Ref Range   Glucose-Capillary 251 (H) 65 - 99 mg/dL  Glucose, capillary     Status: Abnormal   Collection Time: 03/13/15  4:20 PM  Result Value Ref Range   Glucose-Capillary 145 (H) 65 - 99 mg/dL  Glucose, capillary     Status: Abnormal   Collection Time: 03/13/15  9:25 PM  Result Value Ref Range   Glucose-Capillary 106 (H) 65 - 99 mg/dL   Comment 1 Notify RN   Glucose, capillary     Status: Abnormal   Collection Time: 03/14/15  6:58 AM  Result Value Ref Range   Glucose-Capillary 114 (H) 65 - 99 mg/dL   Comment 1 Notify RN      HEENT: poor dentition Cardio: RRR and no murmurs Resp: CTA B/L and unlabored GI: BS positive and NT, ND Extremity:  Pulses positive and No Edema Skin:   Wound Left BK wound staples intact minimal edema ,no drainage Neuro: Alert/Oriented and Abnormal Motor 4/5 Left HF, R HF, KE, ankle DF,  5/5 in BUE Musc/Skel:  Swelling distal stump edema, Gen NAD   Assessment/Plan: 1. 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 Physiatrist is providing close team supervision and 24 hour management of active medical problems listed below. Physiatrist and rehab team continue to assess barriers to discharge/monitor patient progress toward functional and medical goals. Plan D/C on 6/11 FIM: FIM - Bathing Bathing Steps Patient Completed: Chest, Right Arm, Left Arm, Abdomen, Front perineal area, Buttocks, Right upper leg, Left upper leg, Right lower leg (including foot) Bathing: 6: More than reasonable amount of time  FIM - Upper Body Dressing/Undressing Upper body dressing/undressing steps patient completed: Put head through opening of pull over shirt/dress, Thread/unthread right sleeve of pullover shirt/dresss, Thread/unthread left sleeve of pullover shirt/dress, Pull  shirt over trunk Upper body dressing/undressing: 6: Assistive device (Comment) FIM - Lower Body Dressing/Undressing Lower body dressing/undressing steps patient completed: Thread/unthread right underwear leg, Thread/unthread left underwear leg, Pull underwear up/down, Thread/unthread right pants leg, Thread/unthread left pants leg, Pull pants up/down, Don/Doff right shoe, Fasten/unfasten right shoe, Fasten/unfasten pants Lower body dressing/undressing: 6: More than reasonable amount of time  FIM - Toileting Toileting steps completed by patient: Adjust clothing prior to toileting, Performs perineal hygiene, Adjust clothing after toileting Toileting Assistive Devices: Grab bar or rail for support Toileting: 5: Supervision: Safety issues/verbal cues  FIM - Radio producer Devices: Elevated toilet seat, Grab bars Toilet Transfers: 5-To toilet/BSC: Supervision (verbal cues/safety issues), 5-From toilet/BSC: Supervision (verbal cues/safety issues)  FIM - Control and instrumentation engineer Devices: Arm rests Bed/Chair Transfer: 6: Assistive device: no helper, 7: Sit > Supine: No assist, 6: Bed > Chair or W/C: No assist, 6: Chair or W/C > Bed: No assist  FIM - Locomotion: Wheelchair Distance: 150 Locomotion: Wheelchair: 5: Travels 150 ft or more: maneuvers on rugs and over door sills with supervision, cueing or coaxing FIM - Locomotion: Ambulation Locomotion: Ambulation Assistive Devices: Administrator Ambulation/Gait Assistance: 5: Supervision Locomotion: Ambulation: 5: Travels 150 ft or more with supervision/safety issues  Comprehension Comprehension Mode: Auditory Comprehension: 5-Understands complex 90% of the time/Cues < 10% of the time  Expression Expression Mode: Verbal Expression: 5-Expresses complex 90% of the time/cues < 10% of the time  Social Interaction Social Interaction: 6-Interacts appropriately with others with medication or  extra time (anti-anxiety, antidepressant).  Problem Solving Problem Solving: 4-Solves basic 75 - 89% of the time/requires cueing 10 - 24% of the time  Memory Memory: 4-Recognizes or recalls 75 - 89% of the time/requires cueing 10 - 24% of the time  Medical Problem List and Plan: 1. 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 DVT Prophylaxis/Anticoagulation: Eliquis. Monitor for any bleeding episodes 3. Pain Management: Neurontin 300 mg Will increase to 400mg  Oxycodone and Robaxin as needed. Monitor and increased mobility 4. Mood/depression: Wellbutrin 150 mg daily, Zoloft 100 mg daily. Provide emotional support 5. Neuropsych: This patient is capable of making decisions on his own behalf. 6. Skin/Wound Care: Routine skin checks, local care to amputation site- May Shower 7. Fluids/Electrolytes/Nutrition: Routine I&O with follow-up chemistries 8. Acute blood loss anemia. Follow-up CBC 9. Diabetes mellitus with peripheral neuropathy. Hemoglobin A1c 8.1. DiaBeta 5 mg twice a day, Glucophage 1000 mg twice a day. Check blood sugars before meals and at bedtime. Diabetic teaching, control is good in gneral occ spike 10. CAD. Continue Eliquis 11. Hypertension. Coreg 3.125 mg twice a day. Monitor with increased mobility 12. BPH. Flomax 0.4 mg daily. Check PVR 3  LOS (Days) 6 A FACE TO FACE EVALUATION WAS  PERFORMED  Eric Price,Eric Price 03/14/2015, 8:12 AM

## 2015-03-14 NOTE — Consult Note (Signed)
  INITIAL DIAGNOSTIC EVALUATION - CONFIDENTIAL [facility]   MEDICAL NECESSITY:  Eric Price was seen on the [facility] for an initial diagnostic evaluation owing to the patient's diagnosis of BKA.   According to medical records, Eric Price was admitted to the rehab unit owing to "Functional deficits secondary to left BKA." Records also indicate that he is a "64 y.o. right handed male with history of hypertension, CAD maintained on Eliquis, diabetes mellitus and peripheral neuropathy.Presented 03/05/2015 with ischemic left foot secondary to peripheral vascular disease with recent left great toe amputation 02/22/2015. Noted progressive gangrenous changes of left foot and limb was not felt to be salvageable. 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 Oneida Alar."   During today's visit, Eric Price reported suffering from cognitive difficulties that began several months ago prior to the BKA. He described memory loss, word finding difficulty, and poor attention/concentration. His wife was present for the interview and she concurred with this assessment. These issues had an insidious onset and have been progressively worsening, particularly following the BKA surgery.   From an emotional standpoint, Eric Price admitted to experiencing bouts of depression post-surgery. However, overall, he feels that he has been adjusting well to his situation. He has a history of being treated for depression several years ago following an issue at work. He is presently prescribed an antidepressant that he is unsure is helpful. Suicidal/homicidal ideation, plan or intent was denied. No manic or hypomanic episodes were reported. The patient denied ever experiencing any auditory/visual hallucinations. No major behavioral or personality changes were endorsed.   Eric Price believes that he is making strides in therapy. No barriers to therapy identified. He described  the rehab staff as "very good." His wife is his main source of social support and she has been able to visit regularly.   PROCEDURES ADMINISTERED: [1 unit 79024] Diagnostic clinical interview  Review of available records   SUMMARY & RECOMMENDATIONS: Overall, Eric Price reported suffering from a significant degree of cognitive difficulty that began several months prior to the BKA surgery. These issues are perceived to be worsening over time. He also endorsed experiencing mild bouts of depression post-BKA and he is presently being treated via an antidepressant.   Given this information, I plan to complete a thorough cognitive screen at next check as I am concerned about a possible preexisting neurodegenerative condition. I also encouraged Eric Price to consider meeting with a representative from the amputee support group and he agreed to do so. I spoke with social work about facilitating this meeting. In addition, I will follow the patient throughout his stay for supportive psychotherapy.      Rutha Bouchard, Psy.D.  Clinical Neuropsychologist

## 2015-03-15 ENCOUNTER — Inpatient Hospital Stay (HOSPITAL_COMMUNITY): Payer: 59 | Admitting: Occupational Therapy

## 2015-03-15 ENCOUNTER — Inpatient Hospital Stay (HOSPITAL_COMMUNITY): Payer: 59 | Admitting: Physical Therapy

## 2015-03-15 ENCOUNTER — Ambulatory Visit: Payer: 59 | Admitting: Cardiovascular Disease

## 2015-03-15 DIAGNOSIS — R413 Other amnesia: Secondary | ICD-10-CM | POA: Insufficient documentation

## 2015-03-15 LAB — GLUCOSE, CAPILLARY
GLUCOSE-CAPILLARY: 119 mg/dL — AB (ref 65–99)
GLUCOSE-CAPILLARY: 199 mg/dL — AB (ref 65–99)
Glucose-Capillary: 98 mg/dL (ref 65–99)
Glucose-Capillary: 66 mg/dL (ref 65–99)
Glucose-Capillary: 73 mg/dL (ref 65–99)

## 2015-03-15 MED ORDER — CARVEDILOL 3.125 MG PO TABS: 3.1250 mg | ORAL_TABLET | Freq: Two times a day (BID) | ORAL | Status: DC

## 2015-03-15 MED ORDER — OXYCODONE-ACETAMINOPHEN 10-325 MG PO TABS: 1.0000 | ORAL_TABLET | ORAL | Status: DC | PRN

## 2015-03-15 MED ORDER — GLYBURIDE 5 MG PO TABS: ORAL_TABLET | ORAL | Status: DC

## 2015-03-15 MED ORDER — BUPROPION HCL ER (XL) 150 MG PO TB24: 150.0000 mg | ORAL_TABLET | Freq: Every day | ORAL | Status: DC

## 2015-03-15 MED ORDER — ATORVASTATIN CALCIUM 20 MG PO TABS: 60.0000 mg | ORAL_TABLET | Freq: Every day | ORAL | Status: DC

## 2015-03-15 MED ORDER — DOCUSATE SODIUM 100 MG PO CAPS: 100.0000 mg | ORAL_CAPSULE | Freq: Every day | ORAL | Status: DC

## 2015-03-15 MED ORDER — METFORMIN HCL 500 MG PO TABS: 1000.0000 mg | ORAL_TABLET | Freq: Two times a day (BID) | ORAL | Status: DC

## 2015-03-15 MED ORDER — METHOCARBAMOL 500 MG PO TABS: 500.0000 mg | ORAL_TABLET | Freq: Four times a day (QID) | ORAL | Status: DC | PRN

## 2015-03-15 MED ORDER — GABAPENTIN 400 MG PO CAPS: 400.0000 mg | ORAL_CAPSULE | Freq: Three times a day (TID) | ORAL | Status: DC

## 2015-03-15 MED ORDER — TAMSULOSIN HCL 0.4 MG PO CAPS: 0.4000 mg | ORAL_CAPSULE | Freq: Every day | ORAL | Status: DC

## 2015-03-15 MED ORDER — PANTOPRAZOLE SODIUM 40 MG PO TBEC: 40.0000 mg | DELAYED_RELEASE_TABLET | Freq: Every day | ORAL | Status: DC

## 2015-03-15 MED ORDER — SERTRALINE HCL 100 MG PO TABS: 100.0000 mg | ORAL_TABLET | Freq: Every day | ORAL | Status: DC

## 2015-03-15 MED ORDER — FENOFIBRATE 54 MG PO TABS: 54.0000 mg | ORAL_TABLET | Freq: Every day | ORAL | Status: DC

## 2015-03-15 MED ORDER — APIXABAN 5 MG PO TABS: 5.0000 mg | ORAL_TABLET | Freq: Two times a day (BID) | ORAL | Status: DC

## 2015-03-15 NOTE — Progress Notes (Signed)
Left BKA healing Going home tomorrow Follow up in 3 weeks for staple removal  Ruta Hinds, MD [facility]: [phone] Pager: [phone]

## 2015-03-15 NOTE — Progress Notes (Addendum)
Physical Therapy Session Note  Patient Details  Name: Eric Price MRN: [id number] Date of Birth: Feb 27, 1951  Today's Date: 03/15/2015 PT Individual Time: 0800-0846 PT Individual Time Calculation (min): 46 min   Short Term Goals: Week 1:  PT Short Term Goal 1 (Week 1): STG=LTG          Therapy Documentation Precautions:  Precautions Precautions: Fall Precaution Comments: no pillow under Knee to prevent flexion contracture Restrictions Weight Bearing Restrictions: Yes LLE Weight Bearing: Non weight bearing  Short sit to and from supine on bed independent  Sit to and from stand transfer with rolling walker mod I Ambulatory transfer with rolling walker mod I Stand pivot transfer with rolling walker mod I  Patient ambulated 250 feet and 175 feet with RW mod I. Hop to pattern.  Patient up and down 4 steps with left handrail close supervision. Verbal cues sequence and technique. For demonstration for spouse prior to family training.   Family training performed with spouse on stair negotiation, 4 steps with left handrail, spouse educated to provide contact guard assist. Spouse able to provide appropriate cues and positioning to safe negotiation on the stairs.   Patient educated on residual limb care and inspection, desensitization techniques, and figure 8 wrapping. Spouse performed residual limb wrapping.Spouse provided nonadherent dressing, and ace wrap for residual limb shaping and care. Min verbal cues needed for proper technique.   Patient resting in comfortably in wheelchair at end of session with all needs met.    See FIM for current functional status  Therapy/Group: Individual Therapy  Retta Diones 03/15/2015, 9:37 AM

## 2015-03-15 NOTE — Progress Notes (Signed)
Occupational Therapy Discharge Summary  Patient Details  Name: Eric Price MRN: [id number] Date of Birth: March 31, 1951  Patient has met 8 of 8 long term goals due to improved balance, postural control and ability to compensate for deficits.  Patient to discharge at overall Modified Independent level, except supervision with bathroom transfers.  Patient's care partner is independent to provide the necessary assistance at discharge.  Hands on education completed with pt's wife regarding transfers, self-care tasks, and residual limb wrapping.  Reasons goals not met: N/A  Recommendation:  Patient will benefit from ongoing skilled OT services in home health setting to continue to advance functional skills in the area of BADL and Reduce care partner burden.  Equipment: 3 in 1 BSC and tub transfer bench  Reasons for discharge: treatment goals met and discharge from hospital  Patient/family agrees with progress made and goals achieved: Yes  OT Discharge Precautions/Restrictions  Precautions Precautions: Fall Precaution Comments: no pillow under Knee to prevent flexion contracture Pain Pain Assessment Pain Assessment: 0-10 Pain Score: 8  Pain Type: Phantom pain Pain Location: Leg Pain Orientation: Left Pain Descriptors / Indicators: Aching Pain Onset: On-going Patients Stated Pain Goal: 5 Pain Intervention(s): TENS Multiple Pain Sites: No ADL  See FIM Vision/Perception  Vision- History Baseline Vision/History: Wears glasses Wears Glasses: Reading only Patient Visual Report: Blurring of vision Vision- Assessment Vision Assessment?: No apparent visual deficits  Cognition Overall Cognitive Status: Within Functional Limits for tasks assessed Arousal/Alertness: Awake/alert Orientation Level: Oriented X4 Memory: Appears intact Awareness: Appears intact Problem Solving: Appears intact Safety/Judgment: Appears intact Sensation Sensation Light Touch: Appears Intact Stereognosis:  Not tested Hot/Cold: Appears Intact Proprioception: Appears Intact Additional Comments: c/o phantom leg pain  Coordination Gross Motor Movements are Fluid and Coordinated: Yes Fine Motor Movements are Fluid and Coordinated: Yes Motor  Motor Motor: Within Functional Limits Mobility  Bed Mobility Bed Mobility: Rolling Right;Rolling Left Rolling Right: 6: Modified independent (Device/Increase time)  Trunk/Postural Assessment  Cervical Assessment Cervical Assessment: Within Functional Limits Thoracic Assessment Thoracic Assessment: Within Functional Limits Lumbar Assessment Lumbar Assessment: Within Functional Limits Postural Control Postural Control: Within Functional Limits  Balance Balance Balance Assessed: Yes Dynamic Sitting Balance Dynamic Sitting - Level of Assistance: 6: Modified independent (Device/Increase time) Dynamic Sitting - Balance Activities: Lateral lean/weight shifting;Forward lean/weight shifting;Reaching for objects;Reaching across midline Dynamic Standing Balance Dynamic Standing - Level of Assistance: 5: Stand by assistance Extremity/Trunk Assessment RUE Assessment RUE Assessment: Within Functional Limits LUE Assessment LUE Assessment: Within Functional Limits  See FIM for current functional status  Giara Mcgaughey, [facility] 03/15/2015, 3:15 PM

## 2015-03-15 NOTE — Progress Notes (Signed)
Physical Therapy Discharge Summary  Patient Details  Name: Eric Price MRN: [id number] Date of Birth: 01/13/1951  Today's Date: 03/15/2015 PT Individual Time: 1105-1205 PT Individual Time Calculation (min): 60 min    Patient has met 12 of 12 long term goals due to improved activity tolerance, improved balance, improved postural control, increased strength, decreased pain and ability to compensate for deficits.  Patient to discharge at an ambulatory level Modified Independent for all ambulatory mobility and w/c management, while performing stairs with supervision.   Patient's care partner is independent to provide the necessary physical assistance/supervision at discharge.  Reasons goals not met: All goals met  Recommendation:  Patient will benefit from ongoing skilled PT services in home health setting to continue to advance safe functional mobility, address ongoing impairments in gait with RW/crutches, UE/LE strength, LLE ROM, activity tolerance, standing balance, and minimize fall risk.  Equipment: Wheelchair (pt reports already has RW and crutches)  Reasons for discharge: treatment goals met and discharge from hospital  Patient/family agrees with progress made and goals achieved: Yes  PT Discharge Pain Pain Assessment Pain Assessment: 0-10 Pain Score: 8  Pain Type: Phantom pain Pain Location: Leg Pain Orientation: Left Pain Descriptors / Indicators: Aching Pain Onset: On-going Patients Stated Pain Goal: 5 Pain Intervention(s): TENS Multiple Pain Sites: No Cognition Overall Cognitive Status: Within Functional Limits for tasks assessed Arousal/Alertness: Awake/alert Orientation Level: Oriented X4 Memory: Appears intact Awareness: Appears intact Problem Solving: Appears intact Safety/Judgment: Appears intact Sensation Sensation Light Touch: Appears Intact Stereognosis: Not tested Hot/Cold: Appears Intact Proprioception: Appears Intact Additional Comments: c/o phantom  leg pain  Coordination Gross Motor Movements are Fluid and Coordinated: Yes Motor  Motor Motor: Within Functional Limits  Mobility Bed Mobility Bed Mobility: Rolling Right;Rolling Left Rolling Right: 6: Modified independent (Device/Increase time) Transfers Transfers: Yes Stand Pivot Transfers: 6: Modified independent (Device/Increase time) Locomotion  Ambulation Ambulation: Yes Ambulation/Gait Assistance: 6: Modified independent (Device/Increase time) Ambulation Distance (Feet): 150 Feet Assistive device: Rolling walker Gait Gait: Yes Gait Pattern: Trunk flexed Gait velocity: Decreased Stairs / Additional Locomotion Stairs: Yes Stairs Assistance: 5: Supervision Stairs Assistance Details: Verbal cues for precautions/safety;Verbal cues for technique Stairs Assistance Details (indicate cue type and reason): Cues to slow performance for safet Stair Management Technique: One rail Left Number of Stairs: 12 Height of Stairs: 6 Ramp: Not tested (comment) Curb: Not tested (comment) Product manager Mobility: Yes Wheelchair Assistance: 6: Modified independent (Device/Increase time) Environmental health practitioner: Both upper extremities Wheelchair Parts Management: Independent Distance: 250  Trunk/Postural Assessment  Cervical Assessment Cervical Assessment: Within Functional Limits Thoracic Assessment Thoracic Assessment: Within Functional Limits Lumbar Assessment Lumbar Assessment: Within Functional Limits Postural Control Postural Control: Within Functional Limits  Balance Balance Balance Assessed: Yes Dynamic Sitting Balance Dynamic Sitting - Level of Assistance: 6: Modified independent (Device/Increase time) Dynamic Sitting - Balance Activities: Lateral lean/weight shifting;Forward lean/weight shifting;Reaching for objects;Reaching across midline Dynamic Standing Balance Dynamic Standing - Level of Assistance: 5: Stand by assistance Extremity Assessment    LLE  Assessment LLE Assessment: Exceptions to [facility] LLE Strength Left Hip Extension: 3+/5 Left Knee Extension: 4-/5  Skilled Therapeutic Intervention:   Pt received side-lying in bed with wife present; reports pain as described below, otherwise agreeable to treatment. Pt instructed in w/c mobility to/from therapy gym for a total 2 trials of approximately 200 ft with modI. In therapy gym pt instructed in ascent/descent of 12 stairs using L handrail and supervision. Pt requests training on use of crutches because he has a set  at home and believes they would be easier to manage than the RW. Pt educated on proper use and fit of crutches, demonstrated gait pattern, as well as proper sit <> stand transfers with new AD. Pt returns all demonstrations and performs gait with crutches and minA reduced to supervision [id number]. Instructed pt to practice more with home health prior to utilizing full time or doing independently without assistance/supervision. Pt returned to room and TENS applied x13 min to distal L residual limb due to continued phantom limb pain. Pt reports reduction in pain to 5/10 at completion of treatment. Educated pts wife in residual limb wrapping; requires min cues to perform correctly, however is able to self-identify and correct errors until correct performance. Pt remained seated in w/c with wife present and all needs within reach.    See FIM for current functional status  ELuberta Mutter6/07/2015, 2:54 PM

## 2015-03-15 NOTE — Progress Notes (Signed)
Occupational Therapy Session Note  Patient Details  Name: Eric Price MRN: [id number] Date of Birth: 12-01-1950  Today's Date: 03/15/2015 OT Individual Time: 0900-1000 and 1405-1435 OT Individual Time Calculation (min): 60 min and 30 min   Short Term Goals: Week 1:  OT Short Term Goal 1 (Week 1): STG= LTG secondary to estimated short LOS  Skilled Therapeutic Interventions/Progress Updates:    1) Completed ADL retraining at overall modified independent level and supervision with transfers at RW level.  Pt gathered all clothing and supplies from w/c level prior to ambulating into room shower with RW.  Reiterated recommendation for supervision with ambulation and transfers in bathroom with pt and wife reporting understanding.  Pt completed bathing at sit > stand level in room shower at Mod I then transferred to toilet in bathroom to complete dressing as pt plans to do at home.  Wife completed limb wrapping post bathing with pt checking incision pre wrap with inspection mirror and then after wrapping to ensure proper fit.  Oral hygiene completed in standing as pt will most likely stand in home bathroom with pt with no issues in standing.    2) Engaged in therapeutic activity with focus on w/c mobility in community environment while discussing Amputee peer support group and progression of recovery.  Pt's wife present and providing encouragement throughout session.  Pt propelled w/c on/off elevators and over uneven surfaces outside hospital entrance at Mod I level.  Engaged in curb cutout and discussed community reintegration.  Upon return to room pt ambulated to bathroom with RW with supervision from wife and pt completed toileting at Mod I level returning to w/c with RW.  Pt and wife report ready for d/c home and no further questions.  Therapy Documentation Precautions:  Precautions Precautions: Fall Precaution Comments: no pillow under Knee to prevent flexion contracture Restrictions Weight  Bearing Restrictions: Yes LLE Weight Bearing: Non weight bearing Pain: Pain Assessment Pain Assessment: 0-10 Pain Score: 7  Pain Type: Neuropathic pain Pain Location: Leg Pain Orientation: Left Pain Descriptors / Indicators: Tingling;Aching;Shooting Patients Stated Pain Goal: 6 Pain Intervention(s): Medication (See eMAR)  See FIM for current functional status  Therapy/Group: Individual Therapy  [name] 03/15/2015, 10:28 AM

## 2015-03-15 NOTE — Consult Note (Signed)
NEUROCOGNITIVE TESTING - [place]   [name] is a 64 year old, right-handed man, who was seen for a brief neurocognitive evaluation to evaluate his cognitive and emotional functioning in the setting of below knee amputation.  According to this medical record, he was admitted on 03/05/15 with ischemic left foot secondary to peripheral vascular disease with recent left great toe amputation on 02/22/15.  He was found to have progressive gangrenous changes of the left foot and limb was not felt to be salvageable.  He underwent left below-knee amputation and was deemed appropriate for inpatient rehabilitation.  He was seen for an initial psychodiagnostic evaluation by Eric Pastel, PsyD earlier this week. During that appointment, he and his wife described concerns about memory loss, word-finding problems, and inattention occurring with insidious onset over the past several months prior to the current hospitalization.  Dr. Vikki Price was concerned about the possible presence of a neurodegenerative condition.  Therefore, he was referred for the current neuropsychological screening to help ascertain the nature and extent of any cognitive deficits.    PROCEDURES: [3 units of 78469 on 03/915]  The following tests were performed during today's visit: Mini Mental Status Examination (brief version - red), Repeatable Battery for the Assessment of Neuropsychological Status (RBANS, form A), Geriatric Anxiety Inventory, and the Geriatric Depression Scale (short form).  Measures of performance validity were also administered.  Test results are as follows:   MMSE-2 brief Raw Score = 10/16 Description = Impaired   RBANS Indices Scaled Score Percentile Description  Immediate Memory  78 7 Impaired  Visuospatial/Constructional 84 14 Below Average  Language 85 16 Below Average  Attention 75 5 Impaired  Delayed Memory 64 1 Profoundly Impaired  Total Score 71 3 Impaired    RBANS Subtests Raw Score Percentile Description  List Learning 15 < 1 Profoundly Impaired  Story Memory 16 25 Average  Figure Copy 15 3 Impaired  Line Orientation 17 55 Average  Picture Naming 10 73 Average  Semantic Fluency 12 2 Profoundly Impaired  Digit Span 8 14 Below Average  Coding 31 3 Impaired  List Recall 3 8 Impaired  List Recognition 16 < 1 Profoundly Impaired  Story Recall 4 1 Profoundly Impaired  Figure recall 8 8 Impaired   Geriatric Depression Scale (short form) Raw Score = 3/15 Description = WNL   Geriatric Anxiety Inventory Raw Score = 1/20 Description = WNL   Eric Price's performances on objective and embedded measures of performance validity were within normal limits and there were no behavioral manifestations to suggest suboptimal effort.  Therefore, the following test results likely represent an accurate portrayal of his current level of cognitive functioning.    Eric Price performances across cognitive domains reflected impairments that were most obvious in aspects of memory and processing speed. However, the exact etiology of his deficits is unclear.  He reported that he has been undergoing chemotherapy treatments recently and is currently also in significant pain, experiencing fatigue, and is on several pain medications that can cause fatigue.  Any of those factors could be contributing to his cognitive difficulties and the true etiology could be multifactorial.  The pattern of his performances today does NOT reflect patterns that would be concerning for the presence of a neurodegenerative disease process (e.g. Alzheimer's disease).  At this time, there is no evidence of clinically significant mood disruption that is adversely impacting his cognitive functioning.    Given that today's evaluation was indeed a screening for neurocognitive deficits, it is  safe to say that deficits likely exist, but for more information regarding the nature and extent of cognitive  difficulties and to further assist with clarifying the etiology of those deficits, a more thorough neuropsychological evaluation as an outpatient is recommended.        In light of these findings, the following recommendations are provided.    RECOMMENDATIONS:  Recommendations for treatment team:   . When interacting with Eric Price, directions and information should be provided in a simple, straight forward manner, and the treatment team should avoid giving multiple instructions simultaneously.  . Eric Price may also benefit from being provided with multiple trials to learn new skills given the noted memory inefficiencies.  . Information should be provided in writing whenever possible.   . To the extent possible, multitasking should be avoided. . Eric Price requires more time than typical to process information. The treatment team may benefit from waiting for a verbal response to information before presenting additional information.  . Performance will generally be best in a structured, routine, and familiar environment, as opposed to situations involving complex problems.   Recommendations for discharge planning:  . Complete a thorough neuropsychological evaluation as an outpatient in approximately 1 month.  Per his request, contact information for Drs. Beverly Gust and McDermott should be included in his discharge paperwork for that purpose.   . Maintain engagement in mentally, physically and cognitively stimulating activities.  . Strive to maintain a healthy lifestyle (e.g., proper diet and exercise) in order to promote physical, cognitive and emotional health.   Marlane Hatcher, Psy.D.  Clinical Neuropsychologist

## 2015-03-15 NOTE — Progress Notes (Signed)
Social Work Discharge Note Discharge Note  The overall goal for the admission was met for:   Discharge location: Yes-HOME WITH WIFE HOW CAN PROVIDE SUPERVISION LEVEL ONLY  Length of Stay: Yes-8 DAYS  Discharge activity level: Yes-MOD/I-SUPERVISION LEVEL  Home/community participation: Yes  Services provided included: MD, RD, PT, OT, RN, CM, TR, Pharmacy, Neuropsych and SW  Financial Services: Private Insurance: [place]  Follow-up services arranged: Home Health: [facility]-PT,OT,RN, DME: [place], [facility] and Patient/Family request agency HH: PREF DIFFERENT THAN [facility], DME: NO PREF  Comments (or additional information):WIFE HERE DAILY TO ATTEND THERAPIES WITH PT-BOTH COMFORTABLE WITH DISCHARGE TOMORROW  Patient/Family verbalized understanding of follow-up arrangements: Yes  Individual responsible for coordination of the follow-up plan: SELF & [name]-WIFE  Confirmed correct DME delivered: Elease Hashimoto 03/15/2015    Elease Hashimoto

## 2015-03-15 NOTE — Progress Notes (Signed)
Subjective/Complaints: Phantom pain still present LLE. Excited about going home tomorrow  ROS- negative cough or SOB, neg bowel or bladder issues  Objective: Vital Signs: Blood pressure 140/87, pulse 95, temperature 98.4 F (36.9 C), temperature source Oral, resp. rate 16, height 5\' 9"  (1.753 m), weight 89 kg (196 lb 3.4 oz), SpO2 98 %. No results found. Results for orders placed or performed during the hospital encounter of 03/08/15 (from the past 72 hour(s))  Glucose, capillary     Status: Abnormal   Collection Time: 03/12/15 10:54 AM  Result Value Ref Range   Glucose-Capillary 182 (H) 65 - 99 mg/dL  Glucose, capillary     Status: Abnormal   Collection Time: 03/12/15  4:10 PM  Result Value Ref Range   Glucose-Capillary 121 (H) 65 - 99 mg/dL  Glucose, capillary     Status: None   Collection Time: 03/12/15  9:17 PM  Result Value Ref Range   Glucose-Capillary 94 65 - 99 mg/dL   Comment 1 Notify RN   Glucose, capillary     Status: Abnormal   Collection Time: 03/13/15  6:50 AM  Result Value Ref Range   Glucose-Capillary 119 (H) 65 - 99 mg/dL  Glucose, capillary     Status: Abnormal   Collection Time: 03/13/15 11:26 AM  Result Value Ref Range   Glucose-Capillary 250 (H) 65 - 99 mg/dL  Glucose, capillary     Status: Abnormal   Collection Time: 03/13/15 11:28 AM  Result Value Ref Range   Glucose-Capillary 251 (H) 65 - 99 mg/dL  Glucose, capillary     Status: Abnormal   Collection Time: 03/13/15  4:20 PM  Result Value Ref Range   Glucose-Capillary 145 (H) 65 - 99 mg/dL  Glucose, capillary     Status: Abnormal   Collection Time: 03/13/15  9:25 PM  Result Value Ref Range   Glucose-Capillary 106 (H) 65 - 99 mg/dL   Comment 1 Notify RN   Glucose, capillary     Status: Abnormal   Collection Time: 03/14/15  6:58 AM  Result Value Ref Range   Glucose-Capillary 114 (H) 65 - 99 mg/dL   Comment 1 Notify RN   Glucose, capillary     Status: Abnormal   Collection Time: 03/14/15 11:32  AM  Result Value Ref Range   Glucose-Capillary 159 (H) 65 - 99 mg/dL  Glucose, capillary     Status: None   Collection Time: 03/14/15  4:21 PM  Result Value Ref Range   Glucose-Capillary 66 65 - 99 mg/dL   Comment 1 Notify RN   Glucose, capillary     Status: None   Collection Time: 03/14/15  4:49 PM  Result Value Ref Range   Glucose-Capillary 82 65 - 99 mg/dL   Comment 1 Notify RN   Glucose, capillary     Status: Abnormal   Collection Time: 03/14/15  9:20 PM  Result Value Ref Range   Glucose-Capillary 129 (H) 65 - 99 mg/dL  Glucose, capillary     Status: Abnormal   Collection Time: 03/15/15  6:44 AM  Result Value Ref Range   Glucose-Capillary 119 (H) 65 - 99 mg/dL     HEENT: poor dentition Cardio: RRR and no murmurs Resp: CTA B/L and unlabored GI: BS positive and NT, ND Extremity:  Pulses positive and No Edema Skin:   Wound Left BK wound staples intact minimal edema ,no drainage Neuro: Alert/Oriented and Abnormal Motor 4+/5 Left HF, R HF, KE, ankle DF,  5/5 in  BUE Musc/Skel:  Swelling distal stump edema improving, wound well approximated.  Gen NAD Psych: good spirits   Assessment/Plan: 1. 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 Physiatrist is providing close team supervision and 24 hour management of active medical problems listed below. Physiatrist and rehab team continue to assess barriers to discharge/monitor patient progress toward functional and medical goals. Plan D/C on 6/11 FIM: FIM - Bathing Bathing Steps Patient Completed: Chest, Right Arm, Left Arm, Abdomen, Front perineal area, Buttocks, Right upper leg, Left upper leg, Right lower leg (including foot) Bathing: 6: More than reasonable amount of time  FIM - Upper Body Dressing/Undressing Upper body dressing/undressing steps patient completed: Put head through opening of pull over shirt/dress,  Thread/unthread right sleeve of pullover shirt/dresss, Thread/unthread left sleeve of pullover shirt/dress, Pull shirt over trunk Upper body dressing/undressing: 6: Assistive device (Comment) FIM - Lower Body Dressing/Undressing Lower body dressing/undressing steps patient completed: Thread/unthread right underwear leg, Thread/unthread left underwear leg, Pull underwear up/down, Thread/unthread right pants leg, Thread/unthread left pants leg, Pull pants up/down, Don/Doff right shoe, Fasten/unfasten right shoe, Fasten/unfasten pants Lower body dressing/undressing: 6: More than reasonable amount of time  FIM - Toileting Toileting steps completed by patient: Adjust clothing prior to toileting, Performs perineal hygiene, Adjust clothing after toileting Toileting Assistive Devices: Grab bar or rail for support Toileting: 5: Supervision: Safety issues/verbal cues  FIM - Radio producer Devices: Elevated toilet seat, Grab bars Toilet Transfers: 5-To toilet/BSC: Supervision (verbal cues/safety issues), 5-From toilet/BSC: Supervision (verbal cues/safety issues)  FIM - Control and instrumentation engineer Devices: Arm rests Bed/Chair Transfer: 6: Assistive device: no helper, 7: Sit > Supine: No assist, 6: Bed > Chair or W/C: No assist, 6: Chair or W/C > Bed: No assist  FIM - Locomotion: Wheelchair Distance: 250 Locomotion: Wheelchair: 6: Travels 150 ft or more, turns around, maneuvers to table, bed or toilet, negotiates 3% grade: maneuvers on rugs and over door sills independently FIM - Locomotion: Ambulation Locomotion: Ambulation Assistive Devices: Administrator Ambulation/Gait Assistance: 5: Supervision Locomotion: Ambulation: 5: Travels 150 ft or more with supervision/safety issues  Comprehension Comprehension Mode: Auditory Comprehension: 5-Understands complex 90% of the time/Cues < 10% of the time  Expression Expression Mode: Verbal Expression:  5-Expresses complex 90% of the time/cues < 10% of the time  Social Interaction Social Interaction: 6-Interacts appropriately with others with medication or extra time (anti-anxiety, antidepressant).  Problem Solving Problem Solving: 4-Solves basic 75 - 89% of the time/requires cueing 10 - 24% of the time  Memory Memory: 4-Recognizes or recalls 75 - 89% of the time/requires cueing 10 - 24% of the time  Medical Problem List and Plan: 1. Functional deficits secondary to left BKA secondary to gangrenous changes 03/05/2015  -stump shrinker per [facility] today 2. DVT Prophylaxis/Anticoagulation: Eliquis. Monitor for any bleeding episodes 3. Pain Management: Neurontin 300 mg Will increase to 400mg  Oxycodone and Robaxin as needed. Monitor and increased mobility 4. Mood/depression: Wellbutrin 150 mg daily, Zoloft 100 mg daily. Provide emotional support 5. Neuropsych: This patient is capable of making decisions on his own behalf. 6. Skin/Wound Care: local wound care. 7. Fluids/Electrolytes/Nutrition: Routine I&O with follow-up chemistries 8. Acute blood loss anemia.  9. Diabetes mellitus with peripheral neuropathy. Hemoglobin A1c 8.1. DiaBeta 5 mg twice a day, Glucophage 1000 mg twice a day. Check blood sugars before meals and at bedtime. Diabetic teaching, control is good in gneral occ  spike  -improved control 10. CAD. Continue Eliquis 11. Hypertension. Coreg 3.125 mg twice a day. Monitor with increased mobility 12. BPH. Flomax 0.4 mg daily. Check PVR 3  LOS (Days) 7 A FACE TO FACE EVALUATION WAS PERFORMED  Aujanae Mccullum T 03/15/2015, 9:22 AM

## 2015-03-15 NOTE — Discharge Instructions (Signed)
Inpatient Rehab Discharge Instructions  Eric Price Discharge date and time: No discharge date for patient encounter.   Activities/Precautions/ Functional Status: Activity: activity as tolerated Diet: diabetic diet Wound Care: keep wound clean and dry Functional status:  ___ No restrictions     ___ Walk up steps independently ___ 24/7 supervision/assistance   ___ Walk up steps with assistance ___ Intermittent supervision/assistance  ___ Bathe/dress independently ___ Walk with walker     ___ Bathe/dress with assistance ___ Walk Independently    ___ Shower independently _x__ Walk with assistance    ___ Shower with assistance ___ No alcohol     ___ Return to work/school ________  Special Instructions:    COMMUNITY REFERRALS UPON DISCHARGE:    Home Health:   PT & OT RN IN [place]   Date of last service:03/16/2015   Medical Equipment/Items Ordered:WHEELCHAIR, [facility]   [facility] [place] DR. ADAM MCDERMOTT OR DR. Marlane Hatcher [phone]  GENERAL COMMUNITY RESOURCES FOR PATIENT/FAMILY: Support Groups:[facility]   My questions have been answered and I understand these instructions. I will adhere to these goals and the provided educational materials after my discharge from the hospital.  Patient/Caregiver Signature _______________________________ Date __________  Clinician Signature _______________________________________ Date __________  Please bring this form and your medication list with you to all your follow-up doctor's appointments.

## 2015-03-15 NOTE — Significant Event (Signed)
Hypoglycemic Event  CBG: 66  Treatment: 15 GM carbohydrate snack  Symptoms: None  Follow-up CBG: Time:2224 CBG Result:73  Possible Reasons for Event: Unknown  Comments/MD notified:treated per protocol    Eric Price  Remember to initiate Hypoglycemia Order Set & complete

## 2015-03-16 LAB — GLUCOSE, CAPILLARY: Glucose-Capillary: 99 mg/dL (ref 65–99)

## 2015-03-16 NOTE — Progress Notes (Signed)
7414 03/16/15 nsg Pt discharged to home per wheelchair accompanied by NT and family member. Per patient discharged papers are all done. Requested pain med before discharge. No other complaints noted.

## 2015-03-16 NOTE — Progress Notes (Signed)
Subjective/Complaints: Phantom pain better. Feels prepared to go home today. Did not receive shrinker ROS- negative cough or SOB, neg bowel or bladder issues  Objective: Vital Signs: Blood pressure 137/78, pulse 94, temperature 97.8 F (36.6 C), temperature source Oral, resp. rate 18, height 5\' 9"  (1.753 m), weight 89 kg (196 lb 3.4 oz), SpO2 99 %. No results found. Results for orders placed or performed during the hospital encounter of 03/08/15 (from the past 72 hour(s))  Glucose, capillary     Status: Abnormal   Collection Time: 03/13/15 11:26 AM  Result Value Ref Range   Glucose-Capillary 250 (H) 65 - 99 mg/dL  Glucose, capillary     Status: Abnormal   Collection Time: 03/13/15 11:28 AM  Result Value Ref Range   Glucose-Capillary 251 (H) 65 - 99 mg/dL  Glucose, capillary     Status: Abnormal   Collection Time: 03/13/15  4:20 PM  Result Value Ref Range   Glucose-Capillary 145 (H) 65 - 99 mg/dL  Glucose, capillary     Status: Abnormal   Collection Time: 03/13/15  9:25 PM  Result Value Ref Range   Glucose-Capillary 106 (H) 65 - 99 mg/dL   Comment 1 Notify RN   Glucose, capillary     Status: Abnormal   Collection Time: 03/14/15  6:58 AM  Result Value Ref Range   Glucose-Capillary 114 (H) 65 - 99 mg/dL   Comment 1 Notify RN   Glucose, capillary     Status: Abnormal   Collection Time: 03/14/15 11:32 AM  Result Value Ref Range   Glucose-Capillary 159 (H) 65 - 99 mg/dL  Glucose, capillary     Status: None   Collection Time: 03/14/15  4:21 PM  Result Value Ref Range   Glucose-Capillary 66 65 - 99 mg/dL   Comment 1 Notify RN   Glucose, capillary     Status: None   Collection Time: 03/14/15  4:49 PM  Result Value Ref Range   Glucose-Capillary 82 65 - 99 mg/dL   Comment 1 Notify RN   Glucose, capillary     Status: Abnormal   Collection Time: 03/14/15  9:20 PM  Result Value Ref Range   Glucose-Capillary 129 (H) 65 - 99 mg/dL  Glucose, capillary     Status: Abnormal   Collection Time: 03/15/15  6:44 AM  Result Value Ref Range   Glucose-Capillary 119 (H) 65 - 99 mg/dL  Glucose, capillary     Status: Abnormal   Collection Time: 03/15/15 11:32 AM  Result Value Ref Range   Glucose-Capillary 199 (H) 65 - 99 mg/dL  Glucose, capillary     Status: None   Collection Time: 03/15/15  4:50 PM  Result Value Ref Range   Glucose-Capillary 98 65 - 99 mg/dL  Glucose, capillary     Status: None   Collection Time: 03/15/15  9:52 PM  Result Value Ref Range   Glucose-Capillary 66 65 - 99 mg/dL  Glucose, capillary     Status: None   Collection Time: 03/15/15 10:24 PM  Result Value Ref Range   Glucose-Capillary 73 65 - 99 mg/dL  Glucose, capillary     Status: None   Collection Time: 03/16/15  6:50 AM  Result Value Ref Range   Glucose-Capillary 99 65 - 99 mg/dL     HEENT: poor dentition Cardio: RRR and no murmurs Resp: CTA B/L and unlabored GI: BS positive and NT, ND Extremity:  Pulses positive and No Edema Skin:   Wound Left BK wound staples  intact, dry. ACE Neuro: Alert/Oriented and Abnormal Motor 4+ to 5/5 Left HF, R HF, KE, ankle DF,  5/5 in BUE Musc/Skel:  Stump edema improved Gen NAD Psych: good spirits   Assessment/Plan: 1. 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 Physiatrist is providing close team supervision and 24 hour management of active medical problems listed below. Physiatrist and rehab team continue to assess barriers to discharge/monitor patient progress toward functional and medical goals.  Dc home today. Follow up with rehab clinic in about a month. To see surgeon in 1-2 weeks  FIM: FIM - Bathing Bathing Steps Patient Completed: Chest, Right Arm, Left Arm, Abdomen, Front perineal area, Buttocks, Right upper leg, Left upper leg, Right lower leg (including foot) Bathing: 6: More than reasonable amount of time  FIM - Upper Body  Dressing/Undressing Upper body dressing/undressing steps patient completed: Put head through opening of pull over shirt/dress, Thread/unthread right sleeve of pullover shirt/dresss, Thread/unthread left sleeve of pullover shirt/dress, Pull shirt over trunk Upper body dressing/undressing: 6: Assistive device (Comment) FIM - Lower Body Dressing/Undressing Lower body dressing/undressing steps patient completed: Thread/unthread right underwear leg, Thread/unthread left underwear leg, Pull underwear up/down, Thread/unthread right pants leg, Thread/unthread left pants leg, Pull pants up/down, Don/Doff right shoe, Fasten/unfasten right shoe, Fasten/unfasten pants Lower body dressing/undressing: 6: More than reasonable amount of time  FIM - Toileting Toileting steps completed by patient: Adjust clothing prior to toileting, Performs perineal hygiene, Adjust clothing after toileting Toileting Assistive Devices: Grab bar or rail for support Toileting: 6: Assistive device: No helper  FIM - Radio producer Devices: Elevated toilet seat, Grab bars, Insurance account manager Transfers: 5-To toilet/BSC: Supervision (verbal cues/safety issues), 5-From toilet/BSC: Supervision (verbal cues/safety issues)  FIM - Control and instrumentation engineer Devices: Copy: 6: Assistive device: no helper, 7: Sit > Supine: No assist, 7: Supine > Sit: No assist, 6: Bed > Chair or W/C: No assist, 6: Chair or W/C > Bed: No assist  FIM - Locomotion: Wheelchair Distance: 250 Locomotion: Wheelchair: 6: Travels 150 ft or more, turns around, maneuvers to table, bed or toilet, negotiates 3% grade: maneuvers on rugs and over door sills independently FIM - Locomotion: Ambulation Locomotion: Ambulation Assistive Devices: Administrator Ambulation/Gait Assistance: 6: Modified independent (Device/Increase time) Locomotion: Ambulation: 6: Travels 150 ft or more with assistive device/no  helper  Comprehension Comprehension Mode: Auditory Comprehension: 5-Understands complex 90% of the time/Cues < 10% of the time  Expression Expression Mode: Verbal Expression: 5-Expresses complex 90% of the time/cues < 10% of the time  Social Interaction Social Interaction: 6-Interacts appropriately with others with medication or extra time (anti-anxiety, antidepressant).  Problem Solving Problem Solving: 5-Solves basic 90% of the time/requires cueing < 10% of the time  Memory Memory: 5-Recognizes or recalls 90% of the time/requires cueing < 10% of the time  Medical Problem List and Plan: 1. Functional deficits secondary to left BKA secondary to gangrenous changes 03/05/2015  -stump shrinker not provided by [facility]---continue with ACE---provided education to patient today 2. DVT Prophylaxis/Anticoagulation: Eliquis. Monitor for any bleeding episodes 3. Pain Management: Neurontin 300 mg Will increase to 400mg  Oxycodone and Robaxin as needed. Monitor and increased mobility 4. Mood/depression: Wellbutrin 150 mg daily, Zoloft 100 mg daily. Provide emotional support 5. Neuropsych: This patient is capable of making decisions on his own behalf. 6. Skin/Wound Care: local wound care. 7. Fluids/Electrolytes/Nutrition: Routine I&O with follow-up chemistries 8.  Acute blood loss anemia.  9. Diabetes mellitus with peripheral neuropathy. Hemoglobin A1c 8.1. DiaBeta 5 mg twice a day, Glucophage 1000 mg twice a day. Check blood sugars before meals and at bedtime. Diabetic teaching, control is good in gneral occ spike  -improved control 10. CAD. Continue Eliquis 11. Hypertension. Coreg 3.125 mg twice a day. Monitor with increased mobility 12. BPH. Flomax 0.4 mg daily. Check PVR 3  LOS (Days) 8 A FACE TO FACE EVALUATION WAS PERFORMED  Francisco Eyerly T 03/16/2015, 8:26 AM

## 2015-03-18 ENCOUNTER — Telehealth: Payer: Self-pay | Admitting: *Deleted

## 2015-03-18 ENCOUNTER — Encounter: Payer: 59 | Admitting: Surgery

## 2015-03-18 NOTE — Telephone Encounter (Signed)
PT from [place] called asking for verbal orders to continue PT 3x week for 2 weeks and 2x a week for 6 weeks for gait training and home safety following amputation.  I gave verbal orders and asked for the fax to be sent over and be signed by our physician

## 2015-03-20 ENCOUNTER — Ambulatory Visit (INDEPENDENT_AMBULATORY_CARE_PROVIDER_SITE_OTHER): Payer: 59 | Admitting: Family Medicine

## 2015-03-20 ENCOUNTER — Encounter: Payer: Self-pay | Admitting: Family Medicine

## 2015-03-20 VITALS — BP 130/80 | HR 103 | Temp 97.4°F | Wt 198.0 lb

## 2015-03-20 DIAGNOSIS — Z8659 Personal history of other mental and behavioral disorders: Secondary | ICD-10-CM

## 2015-03-20 DIAGNOSIS — I10 Essential (primary) hypertension: Secondary | ICD-10-CM

## 2015-03-20 DIAGNOSIS — Z89512 Acquired absence of left leg below knee: Secondary | ICD-10-CM | POA: Diagnosis not present

## 2015-03-20 DIAGNOSIS — E785 Hyperlipidemia, unspecified: Secondary | ICD-10-CM

## 2015-03-20 DIAGNOSIS — N400 Enlarged prostate without lower urinary tract symptoms: Secondary | ICD-10-CM
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
## 2015-03-20 NOTE — Progress Notes (Signed)
Pre visit review using our clinic review tool, if applicable. No additional management support is needed unless otherwise documented below in the visit note. 

## 2015-03-20 NOTE — Patient Instructions (Signed)
If blood sugars are dropping below 60 would drop one dose of the Diabeta (night time dose).

## 2015-03-25 ENCOUNTER — Ambulatory Visit: Payer: 59 | Admitting: Family Medicine

## 2015-03-29 ENCOUNTER — Encounter: Payer: Self-pay | Admitting: Cardiovascular Disease

## 2015-03-29 ENCOUNTER — Ambulatory Visit (INDEPENDENT_AMBULATORY_CARE_PROVIDER_SITE_OTHER): Payer: 59 | Admitting: Cardiovascular Disease

## 2015-03-29 VITALS — BP 120/78 | HR 88 | Ht 69.0 in | Wt 199.5 lb

## 2015-03-29 DIAGNOSIS — I10 Essential (primary) hypertension: Secondary | ICD-10-CM

## 2015-03-29 DIAGNOSIS — I519 Heart disease, unspecified: Secondary | ICD-10-CM | POA: Diagnosis not present

## 2015-03-29 DIAGNOSIS — Z79899 Other long term (current) drug therapy: Secondary | ICD-10-CM

## 2015-03-29 LAB — BASIC METABOLIC PANEL
BUN: 11 mg/dL (ref 6–23)
CHLORIDE: 105 meq/L (ref 96–112)
CO2: 26 meq/L (ref 19–32)
Calcium: 9.8 mg/dL (ref 8.4–10.5)
Creat: 0.84 mg/dL (ref 0.50–1.35)
Glucose, Bld: 176 mg/dL — ABNORMAL HIGH (ref 70–99)
POTASSIUM: 4.7 meq/L (ref 3.5–5.3)
SODIUM: 141 meq/L (ref 135–145)

## 2015-03-29 MED ORDER — LISINOPRIL 2.5 MG PO TABS: 2.5000 mg | ORAL_TABLET | Freq: Every day | ORAL | Status: DC

## 2015-03-29 MED ORDER — CARVEDILOL 6.25 MG PO TABS: 6.2500 mg | ORAL_TABLET | Freq: Two times a day (BID) | ORAL | Status: DC

## 2015-03-29 NOTE — Assessment & Plan Note (Signed)
History of left great toe critical limb ischemia with Dopplers that suggested occluded tibial arteries and on 02/12/15 with angiogram performed  9 days later showing occluded tibials as well as profundus femoris. He ultimately underwent left great toe amputation was felt that he'll and subsequent left BKA by Dr. Oneida Alar . He was placed on oral and a coagulation with a novel agent. The assumption was that he had a thromboembolic event from a nonischemic cardio myopathy and potential hypercoagulable state from his skin cancer and /or chemotherapy.

## 2015-03-29 NOTE — Assessment & Plan Note (Signed)
History of hypertension with blood pressure measured today in the 120/80 range on carvedilol. I'm going to titrate his Coreg as well as add low-dose lisinopril.

## 2015-03-29 NOTE — Assessment & Plan Note (Signed)
Mr. She will underwent cardiac catheterization by myself on 02/21/15 revealing a 40% proximal LAD, 75% fairly focal distal dominant RCA stenosis. EF was 25-35%. Was thought this may be related to chemotherapy for his mycosis fungoides. He is on carvedilol low-dose. Vital signs are stable. I'm going to titrate him to 6.25 mg by mouth twice a day, add low-dose lisinopril and I will check blood work in 2-3 weeks and have him see Erasmo Downer back in one month for medicine titration, vital sign and blood work assessment. I will check a repeat 2-D echo in 3 months and if his EF has not improved about 35% will consider referral for ICD implantation for primary prevention.

## 2015-03-29 NOTE — Patient Instructions (Addendum)
Medication Instructions:  INCREASE Carvedilol to 6.25 mg. Of your current prescription you may take 2 tablets until it runs out. A new prescription has been sent to your pharmacy electronically. When you pick up the new prescription, please follow those instructions listed on the bottle. START Lisinopril 2.5 mg - take 1 tablet by mouth daily. A prescription has been sent to your pharmacy as well.  Labwork: Your physician recommends that you return for lab work in 2-3 weeks.  Testing/Procedures: Your physician has requested that you have an echocardiogram in 3 months. Echocardiography is a painless test that uses sound waves to create images of your heart. It provides your doctor with information about the size and shape of your heart and how well your heart's chambers and valves are working. This procedure takes approximately one hour. There are no restrictions for this procedure.   Follow-Up: Your physician recommends that you schedule a follow-up appointment in 1 month with our pharmacist, Nehemiah Massed, PharmD, for a blood pressure check. Please check your blood pressure daily and keep a record of your blood pressures to bring with you to this appointment.  Dr Gwenlyn Found recommends that you schedule a follow-up appointment in 3 months after your echocardiogram.
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
## 2015-03-30 ENCOUNTER — Encounter (HOSPITAL_COMMUNITY): Payer: Self-pay | Admitting: Emergency Medicine

## 2015-03-30 ENCOUNTER — Emergency Department (HOSPITAL_COMMUNITY): Payer: 59

## 2015-03-30 ENCOUNTER — Emergency Department (HOSPITAL_COMMUNITY)
Admission: EM | Admit: 2015-03-30 | Discharge: 2015-03-31 | Disposition: A | Discharge status: Home / Self Care | Payer: 59 | Attending: Emergency Medicine | Admitting: Emergency Medicine

## 2015-03-30 ENCOUNTER — Other Ambulatory Visit: Payer: Self-pay

## 2015-03-30 DIAGNOSIS — E119 Type 2 diabetes mellitus without complications: Secondary | ICD-10-CM | POA: Insufficient documentation

## 2015-03-30 DIAGNOSIS — Z79899 Other long term (current) drug therapy: Secondary | ICD-10-CM | POA: Insufficient documentation

## 2015-03-30 DIAGNOSIS — Z9889 Other specified postprocedural states: Secondary | ICD-10-CM | POA: Insufficient documentation

## 2015-03-30 DIAGNOSIS — M159 Polyosteoarthritis, unspecified: Secondary | ICD-10-CM | POA: Diagnosis not present

## 2015-03-30 DIAGNOSIS — Z7952 Long term (current) use of systemic steroids: Secondary | ICD-10-CM | POA: Insufficient documentation

## 2015-03-30 DIAGNOSIS — S79911A Unspecified injury of right hip, initial encounter: Secondary | ICD-10-CM | POA: Insufficient documentation

## 2015-03-30 DIAGNOSIS — C8400 Mycosis fungoides, unspecified site: Secondary | ICD-10-CM | POA: Diagnosis not present

## 2015-03-30 DIAGNOSIS — Z7902 Long term (current) use of antithrombotics/antiplatelets: Secondary | ICD-10-CM | POA: Diagnosis not present

## 2015-03-30 DIAGNOSIS — Z8701 Personal history of pneumonia (recurrent): Secondary | ICD-10-CM | POA: Insufficient documentation

## 2015-03-30 DIAGNOSIS — I2510 Atherosclerotic heart disease of native coronary artery without angina pectoris: Secondary | ICD-10-CM | POA: Insufficient documentation

## 2015-03-30 DIAGNOSIS — Y9289 Other specified places as the place of occurrence of the external cause: Secondary | ICD-10-CM | POA: Insufficient documentation

## 2015-03-30 DIAGNOSIS — Z9981 Dependence on supplemental oxygen: Secondary | ICD-10-CM | POA: Insufficient documentation

## 2015-03-30 DIAGNOSIS — R079 Chest pain, unspecified: Secondary | ICD-10-CM

## 2015-03-30 DIAGNOSIS — F329 Major depressive disorder, single episode, unspecified: Secondary | ICD-10-CM | POA: Insufficient documentation

## 2015-03-30 DIAGNOSIS — W1839XA Other fall on same level, initial encounter: Secondary | ICD-10-CM | POA: Diagnosis not present

## 2015-03-30 DIAGNOSIS — Z86718 Personal history of other venous thrombosis and embolism: Secondary | ICD-10-CM | POA: Insufficient documentation

## 2015-03-30 DIAGNOSIS — S3992XA Unspecified injury of lower back, initial encounter: Secondary | ICD-10-CM | POA: Diagnosis not present

## 2015-03-30 DIAGNOSIS — I739 Peripheral vascular disease, unspecified: Secondary | ICD-10-CM | POA: Diagnosis not present

## 2015-03-30 DIAGNOSIS — I10 Essential (primary) hypertension: Secondary | ICD-10-CM | POA: Diagnosis not present

## 2015-03-30 DIAGNOSIS — Z89512 Acquired absence of left leg below knee: Secondary | ICD-10-CM | POA: Diagnosis not present

## 2015-03-30 DIAGNOSIS — E785 Hyperlipidemia, unspecified: Secondary | ICD-10-CM | POA: Diagnosis not present

## 2015-03-30 DIAGNOSIS — N390 Urinary tract infection, site not specified: Secondary | ICD-10-CM

## 2015-03-30 DIAGNOSIS — G4730 Sleep apnea, unspecified: Secondary | ICD-10-CM | POA: Diagnosis not present

## 2015-03-30 DIAGNOSIS — S299XXA Unspecified injury of thorax, initial encounter: Secondary | ICD-10-CM | POA: Insufficient documentation

## 2015-03-30 DIAGNOSIS — W19XXXA Unspecified fall, initial encounter: Secondary | ICD-10-CM

## 2015-03-30 DIAGNOSIS — Z7901 Long term (current) use of anticoagulants: Secondary | ICD-10-CM | POA: Diagnosis not present

## 2015-03-30 DIAGNOSIS — Y998 Other external cause status: Secondary | ICD-10-CM | POA: Diagnosis not present

## 2015-03-30 DIAGNOSIS — Z794 Long term (current) use of insulin: Secondary | ICD-10-CM | POA: Insufficient documentation

## 2015-03-30 DIAGNOSIS — Y9389 Activity, other specified: Secondary | ICD-10-CM | POA: Diagnosis not present

## 2015-03-30 LAB — BASIC METABOLIC PANEL
ANION GAP: 10 (ref 5–15)
BUN: 13 mg/dL (ref 6–20)
CALCIUM: 9.6 mg/dL (ref 8.9–10.3)
CO2: 24 mmol/L (ref 22–32)
CREATININE: 0.88 mg/dL (ref 0.61–1.24)
Chloride: 103 mmol/L (ref 101–111)
GFR calc Af Amer: >60 mL/min (ref >60)
GFR calc non Af Amer: >60 mL/min (ref >60)
Glucose, Bld: 127 mg/dL — ABNORMAL HIGH (ref 65–99)
Potassium: 3.7 mmol/L (ref 3.5–5.1)
Sodium: 137 mmol/L (ref 135–145)

## 2015-03-30 LAB — CBC
HCT: 37.3 % — ABNORMAL LOW (ref 39.0–52.0)
Hemoglobin: 12.4 g/dL — ABNORMAL LOW (ref 13.0–17.0)
MCH: 27.4 pg (ref 26.0–34.0)
MCHC: 33.2 g/dL (ref 30.0–36.0)
MCV: 82.5 fL (ref 78.0–100.0)
PLATELETS: 317 10*3/uL (ref 150–400)
RBC: 4.52 MIL/uL (ref 4.22–5.81)
RDW: 13.5 % (ref 11.5–15.5)
WBC: 15.3 10*3/uL — AB (ref 4.0–10.5)

## 2015-03-30 LAB — I-STAT TROPONIN, ED: Troponin i, poc: 0.01 ng/mL (ref 0.00–0.08)

## 2015-03-30 LAB — I-STAT CG4 LACTIC ACID, ED: LACTIC ACID, VENOUS: 1.6 mmol/L (ref 0.5–2.0)

## 2015-03-30 MED ORDER — ACETAMINOPHEN 325 MG PO TABS
325.0000 mg | ORAL_TABLET | Freq: Once | ORAL | Status: AC
  Administered 2015-03-31: 325 mg via ORAL
  Filled 2015-03-30: qty 1

## 2015-03-30 MED ORDER — SODIUM CHLORIDE 0.9 % IV BOLUS (SEPSIS)
1000.0000 mL | Freq: Once | INTRAVENOUS | Status: AC
  Administered 2015-03-31: 1000 mL via INTRAVENOUS

## 2015-03-30 MED ORDER — ASPIRIN 325 MG PO TABS
325.0000 mg | ORAL_TABLET | ORAL | Status: AC
  Administered 2015-03-30: 325 mg via ORAL
  Filled 2015-03-30: qty 1

## 2015-03-30 MED ORDER — IOHEXOL 350 MG/ML SOLN
80.0000 mL | Freq: Once | INTRAVENOUS | Status: AC | PRN
  Administered 2015-03-30: 80 mL via INTRAVENOUS

## 2015-03-30 MED ORDER — NITROGLYCERIN 0.4 MG SL SUBL
0.4000 mg | SUBLINGUAL_TABLET | SUBLINGUAL | Status: DC | PRN
  Administered 2015-03-30: 0.4 mg via SUBLINGUAL
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
## 2015-03-30 NOTE — ED Notes (Signed)
Pt in EMS C/O chest pain that started after falling 6 hrs ago. Pt had L foot amputated 1 week ago and lost balance. Chest pain started after fall, has been intermittent on R side, slightly diaphoretic. Emesis X1 en route. EKG - ST en route. Given 1 NTG en route. Pt has hx cancer and last had chemo 1 mo ago, also significant cardiac hx

## 2015-03-31 ENCOUNTER — Emergency Department (HOSPITAL_COMMUNITY): Payer: 59

## 2015-03-31 LAB — URINALYSIS, ROUTINE W REFLEX MICROSCOPIC
Bilirubin Urine: NEGATIVE
GLUCOSE, UA: NEGATIVE mg/dL
Ketones, ur: NEGATIVE mg/dL
Nitrite: POSITIVE — AB
Protein, ur: NEGATIVE mg/dL
Specific Gravity, Urine: <1.005 — ABNORMAL LOW (ref 1.005–1.030)
Urobilinogen, UA: 1 mg/dL (ref 0.0–1.0)
pH: 5.5 (ref 5.0–8.0)

## 2015-03-31 LAB — URINE MICROSCOPIC-ADD ON

## 2015-03-31 LAB — I-STAT CG4 LACTIC ACID, ED: Lactic Acid, Venous: 1.36 mmol/L (ref 0.5–2.0)

## 2015-03-31 LAB — I-STAT TROPONIN, ED: TROPONIN I, POC: 0 ng/mL (ref 0.00–0.08)

## 2015-03-31 MED ORDER — ACETAMINOPHEN 325 MG PO TABS
650.0000 mg | ORAL_TABLET | Freq: Once | ORAL | Status: AC
  Administered 2015-03-31: 650 mg via ORAL
  Filled 2015-03-31: qty 2

## 2015-03-31 MED ORDER — LEVOFLOXACIN 750 MG PO TABS
750.0000 mg | ORAL_TABLET | Freq: Once | ORAL | Status: AC
  Administered 2015-03-31: 750 mg via ORAL
  Filled 2015-03-31: qty 1

## 2015-03-31 MED ORDER — SODIUM CHLORIDE 0.9 % IV BOLUS (SEPSIS)
1000.0000 mL | Freq: Once | INTRAVENOUS | Status: AC
  Administered 2015-03-31: 1000 mL via INTRAVENOUS

## 2015-03-31 MED ORDER — LEVOFLOXACIN 750 MG PO TABS: 750.0000 mg | ORAL_TABLET | Freq: Every day | ORAL | Status: DC

## 2015-03-31 NOTE — ED Notes (Signed)
Dr Alvino Chapel reviewed pt chart.  Advised ok to discharge as planned.

## 2015-03-31 NOTE — ED Notes (Signed)
Reported vitals after 2nd bolus infused. MD acknowledges, orders 3rd bolus.

## 2015-03-31 NOTE — ED Notes (Signed)
Secretary is calling for ptar transport, as patient has new bka and uses wheelchair at home.

## 2015-03-31 NOTE — Discharge Instructions (Signed)
Chest Pain (Nonspecific) [name], your EKG and blood work today were normal. Your urine shows an infection. Take antibiotics as indicated and see your primary care physician within 3 days for close follow-up. If any symptoms worsen come back to the emergency department immediately. Thank you. It is often hard to give a diagnosis for the cause of chest pain. There is always a chance that your pain could be related to something serious, such as a heart attack or a blood clot in the lungs. You need to follow up with your doctor. HOME CARE  If antibiotic medicine was given, take it as directed by your doctor. Finish the medicine even if you start to feel better.  For the next few days, avoid activities that bring on chest pain. Continue physical activities as told by your doctor.  Do not use any tobacco products. This includes cigarettes, chewing tobacco, and e-cigarettes.  Avoid drinking alcohol.  Only take medicine as told by your doctor.  Follow your doctor's suggestions for more testing if your chest pain does not go away.  Keep all doctor visits you made. GET HELP IF:  Your chest pain does not go away, even after treatment.  You have a rash with blisters on your chest.  You have a fever. GET HELP RIGHT AWAY IF:   You have more pain or pain that spreads to your arm, neck, jaw, back, or belly (abdomen).  You have shortness of breath.  You cough more than usual or cough up blood.  You have very bad back or belly pain.  You feel sick to your stomach (nauseous) or throw up (vomit).  You have very bad weakness.  You pass out (faint).  You have chills. This is an emergency. Do not wait to see if the problems will go away. Call your local emergency services (911 in [place] Do not drive yourself to the hospital. MAKE SURE YOU:   Understand these instructions.  Will watch your condition.  Will get help right away if you are not doing well or get worse. Document Released:  03/09/2008 Document Revised: 09/26/2013 Document Reviewed: 03/09/2008 [facility] Patient Information 2015 [place], [place]. This information is not intended to replace advice given to you by your health care provider. Make sure you discuss any questions you have with your health care provider. Urinary Tract Infection A urinary tract infection (UTI) can occur any place along the urinary tract. The tract includes the kidneys, ureters, bladder, and urethra. A type of germ called bacteria often causes a UTI. UTIs are often helped with antibiotic medicine.  HOME CARE   If given, take antibiotics as told by your doctor. Finish them even if you start to feel better.  Drink enough fluids to keep your pee (urine) clear or pale yellow.  Avoid tea, drinks with caffeine, and bubbly (carbonated) drinks.  Pee often. Avoid holding your pee in for a long time.  Pee before and after having sex (intercourse).  Wipe from front to back after you poop (bowel movement) if you are a woman. Use each tissue only once. GET HELP RIGHT AWAY IF:   You have back pain.  You have lower belly (abdominal) pain.  You have chills.  You feel sick to your stomach (nauseous).  You throw up (vomit).  Your burning or discomfort with peeing does not go away.  You have a fever.  Your symptoms are not better in 3 days. MAKE SURE YOU:   Understand these instructions.  Will watch your  condition.  Will get help right away if you are not doing well or get worse. Document Released: 03/09/2008 Document Revised: 06/15/2012 Document Reviewed: 04/21/2012 [facility] Patient Information 2015 [place], [place]. This information is not intended to replace advice given to you by your health care provider. Make sure you discuss any questions you have with your health care provider.

## 2015-03-31 NOTE — ED Notes (Signed)
Reviewed temperature with Dr. Claudine Mouton. MD acknowledges, reveiwed last dose and prepares for discharge with tylenol and levaquin for UTI.

## 2015-03-31 NOTE — ED Notes (Signed)
Clarified with Dr. Tawnya Crook, patient is to receive 1 view xray of the right hip.

## 2015-03-31 NOTE — ED Provider Notes (Signed)
I was signed out as pending a urinalysis prior to discharge. Urinalysis shows an infection. Patient given Levaquin and discharged with prescription. Patient also spiked a low-grade temperature to 37.8. He was given,. Primary care follow-up is advised. He is found resting comfortable in the room and in no acute distress. We'll give additional fluids to treat tachycardia. He is safe for discharge.  Everlene Balls, MD 03/31/15 [phone]

## 2015-03-31 NOTE — ED Notes (Signed)
Pt c/o severe chest pain.  Asked pt if he was at home if he would come to the ED for the same, he replied yes.  Notified Dr Alvino Chapel.

## 2015-04-01 ENCOUNTER — Other Ambulatory Visit: Payer: Self-pay

## 2015-04-02 LAB — URINE CULTURE: Culture: >=100000

## 2015-04-03 ENCOUNTER — Encounter: Payer: Self-pay | Admitting: Vascular Surgery

## 2015-04-03 ENCOUNTER — Telehealth (HOSPITAL_COMMUNITY): Payer: Self-pay

## 2015-04-03 NOTE — Telephone Encounter (Signed)
Post ED Visit - Positive Culture Follow-up  Culture report reviewed by antimicrobial stewardship pharmacist: []  Wes Dulaney, Pharm.D., BCPS [x]  Heide Guile, Pharm.D., BCPS []  Alycia Rossetti, Pharm.D., BCPS []  [place], Pharm.D., BCPS, AAHIVP []  Legrand Como, Pharm.D., BCPS, AAHIVP []  Isac Sarna, Pharm.D., BCPS  Positive urine culture Treated with levofloxacin, organism sensitive to the same and no further patient follow-up is required at this time.  Ileene Musa 04/03/2015, 5:05 PM

## 2015-04-04 ENCOUNTER — Encounter: Payer: Self-pay | Admitting: Vascular Surgery

## 2015-04-04 ENCOUNTER — Ambulatory Visit (INDEPENDENT_AMBULATORY_CARE_PROVIDER_SITE_OTHER): Payer: Self-pay | Admitting: Vascular Surgery

## 2015-04-04 VITALS — BP 132/69 | HR 94 | Temp 98.2°F | Ht 69.0 in | Wt 198.0 lb

## 2015-04-04 DIAGNOSIS — Z89512 Acquired absence of left leg below knee: Secondary | ICD-10-CM

## 2015-04-04 NOTE — Patient Instructions (Signed)
[facility] [place]   Ongoing Events every month.  See event details for scheduling information.   [place], [place] group for amputees, family members and friends.  Registration Details For additional information, call Jamey Reas at [phone].  Fees & Payment This support group is free.

## 2015-04-04 NOTE — Progress Notes (Signed)
Patient is a 64 year old male who is now one month status post left below-knee amputation. He is regaining strength. He overall was very active prior to his amputation. He wishes to remain very active after obtaining a prosthetic leg. He is very motivated to do this. He denies any drainage from his incision. He has done well in rehabilitation.  Physical exam:  Filed Vitals:   04/04/15 0901  BP: 132/69  Pulse: 94  Temp: 98.2 F (36.8 C)  TempSrc: Oral  Height: 5\' 9"  (1.753 m)  Weight: 198 lb (89.812 kg)  SpO2: 99%    Left below-knee amputation well-healed staples removed today  Assessment: Doing well status post left below-knee amputation. The patient is highly motivated and very active and I believe he would benefit from a K3 prosthetic. Shrinker was prescribed today.  Plan: The patient will follow-up on as-needed basis.  Ruta Hinds, MD [facility] Office: [phone] Pager: [phone]

## 2015-04-10 ENCOUNTER — Other Ambulatory Visit: Payer: Self-pay | Admitting: *Deleted

## 2015-04-10 MED ORDER — DOCUSATE SODIUM 100 MG PO CAPS: 100.0000 mg | ORAL_CAPSULE | Freq: Every day | ORAL | Status: DC

## 2015-04-12 ENCOUNTER — Other Ambulatory Visit: Payer: Self-pay | Admitting: Internal Medicine

## 2015-04-15 ENCOUNTER — Encounter: Payer: Self-pay | Admitting: Family Medicine

## 2015-04-15 ENCOUNTER — Ambulatory Visit (INDEPENDENT_AMBULATORY_CARE_PROVIDER_SITE_OTHER): Payer: 59 | Admitting: Family Medicine

## 2015-04-15 VITALS — BP 130/82 | HR 96 | Temp 97.8°F | Wt 203.0 lb

## 2015-04-15 DIAGNOSIS — E1142 Type 2 diabetes mellitus with diabetic polyneuropathy: Secondary | ICD-10-CM

## 2015-04-15 DIAGNOSIS — E1165 Type 2 diabetes mellitus with hyperglycemia: Secondary | ICD-10-CM

## 2015-04-15 DIAGNOSIS — M25551 Pain in right hip: Secondary | ICD-10-CM

## 2015-04-15 DIAGNOSIS — IMO0002 Reserved for concepts with insufficient information to code with codable children: Secondary | ICD-10-CM

## 2015-04-15 DIAGNOSIS — R300 Dysuria: Secondary | ICD-10-CM | POA: Diagnosis not present

## 2015-04-15 DIAGNOSIS — Z87440 Personal history of urinary (tract) infections: Secondary | ICD-10-CM

## 2015-04-15 LAB — POCT URINALYSIS DIPSTICK
Bilirubin, UA: NEGATIVE
Glucose, UA: NEGATIVE
Ketones, UA: NEGATIVE
LEUKOCYTES UA: NEGATIVE
NITRITE UA: NEGATIVE
PH UA: 5.5
PROTEIN UA: NEGATIVE
RBC UA: NEGATIVE
SPEC GRAV UA: 1.025
Urobilinogen, UA: 1

## 2015-04-15 MED ORDER — APIXABAN 5 MG PO TABS: 5.0000 mg | ORAL_TABLET | Freq: Two times a day (BID) | ORAL | Status: DC

## 2015-04-15 NOTE — Progress Notes (Signed)
Pre visit review using our clinic review tool, if applicable. No additional management support is needed unless otherwise documented below in the visit note. 

## 2015-04-15 NOTE — Patient Instructions (Signed)
Stay well hydrated. Follow up immediately for any recurrent fever or recurrent urinary symptoms. Your urine today is clear.
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
## 2015-04-19 ENCOUNTER — Inpatient Hospital Stay: Payer: 59 | Admitting: Physical Medicine & Rehabilitation

## 2015-04-22 ENCOUNTER — Ambulatory Visit (HOSPITAL_BASED_OUTPATIENT_CLINIC_OR_DEPARTMENT_OTHER): Payer: 59 | Admitting: Physical Medicine & Rehabilitation

## 2015-04-22 ENCOUNTER — Other Ambulatory Visit: Payer: Self-pay | Admitting: *Deleted

## 2015-04-22 ENCOUNTER — Encounter: Payer: Self-pay | Admitting: Physical Medicine & Rehabilitation

## 2015-04-22 ENCOUNTER — Telehealth: Payer: Self-pay | Admitting: *Deleted

## 2015-04-22 ENCOUNTER — Other Ambulatory Visit: Payer: Self-pay | Admitting: Family Medicine

## 2015-04-22 ENCOUNTER — Encounter: Payer: 59 | Attending: Physical Medicine & Rehabilitation

## 2015-04-22 VITALS — BP 123/78 | HR 84 | Resp 14

## 2015-04-22 DIAGNOSIS — R200 Anesthesia of skin: Secondary | ICD-10-CM

## 2015-04-22 DIAGNOSIS — G546 Phantom limb syndrome with pain: Secondary | ICD-10-CM | POA: Diagnosis not present

## 2015-04-22 DIAGNOSIS — Z89512 Acquired absence of left leg below knee: Secondary | ICD-10-CM

## 2015-04-22 DIAGNOSIS — E1142 Type 2 diabetes mellitus with diabetic polyneuropathy: Secondary | ICD-10-CM

## 2015-04-22 DIAGNOSIS — IMO0002 Reserved for concepts with insufficient information to code with codable children: Secondary | ICD-10-CM

## 2015-04-22 DIAGNOSIS — E1165 Type 2 diabetes mellitus with hyperglycemia: Secondary | ICD-10-CM

## 2015-04-22 MED ORDER — GABAPENTIN 400 MG PO CAPS: 400.0000 mg | ORAL_CAPSULE | Freq: Four times a day (QID) | ORAL | Status: DC

## 2015-04-22 MED ORDER — OXYCODONE-ACETAMINOPHEN 10-325 MG PO TABS: 1.0000 | ORAL_TABLET | Freq: Two times a day (BID) | ORAL | Status: DC

## 2015-04-22 MED ORDER — METHOCARBAMOL 500 MG PO TABS: 500.0000 mg | ORAL_TABLET | Freq: Two times a day (BID) | ORAL | Status: DC

## 2015-04-22 MED ORDER — ATORVASTATIN CALCIUM 20 MG PO TABS: 60.0000 mg | ORAL_TABLET | Freq: Every day | ORAL | Status: DC

## 2015-04-22 NOTE — Progress Notes (Signed)
Subjective:    Patient ID: Eric Price, male    DOB: [birth year], 64 y.o.   MRN: [id number] 64 year old right-handed male with history of hypertension and coronary artery disease, maintained on Eliquis,  independent with a cane prior to admission living with his wife.  Presented on Mar 05, 2015, with ischemic left foot secondary to peripheral vascular disease with recent left great toe amputation. Noted progressive gangrenous changes of left foot and limb was not felt to be salvageable.  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 Oneida Alar.  HPI Chief complaint: New right little finger numbness Denies any neck pain no trauma to the right arm during a fall. He does tend to lean on the right elbow. He thinks his numbness increases with his elbow in a bent position. He has not had this problem in the past. No weakness in the handNo history of hand surgery. There was wrist surgery for a fracture several years ago approximately 10-15 Patient was discharged from [facility] 03/16/2015. Patient had home health PT and OT until he was discharged. Interval history also includes ER visit after a fall off the bottom of the steps. He did not have any serious injuries. At that time it was noted that there was a UTI and patient was treated as an outpatient with antibiotics orally. The patient has followed up with vascular surgery and a prosthetic was ordered K3. Prior to his amputation the patient was very active. He was independent with his ambulation.He was not using an assistive device prior to his surgery  The patient is also followed up with his primary care physician since discharge from the hospital.  In terms of his pain he mainly has positive phantom limb pain and left lower extremity. The gabapentin helps but then wears off. In terms of the stump pain it is relieved with oxycodone 10 mg combined with methocarbamol 500 mg twice a day. He takes his morning and evening. During  the day his pain is relatively well maintained.  Review of Systems  HENT: Negative.   Eyes: Negative.   Respiratory: Negative.   Cardiovascular: Negative.   Gastrointestinal: Negative.   Endocrine: Negative.   Genitourinary: Negative.   Musculoskeletal: Positive for back pain and arthralgias.       Phantom limb pain  Skin: Negative.   Allergic/Immunologic: Negative.   Neurological: Positive for weakness.       Tingling, trouble walking  Hematological: Negative.   Psychiatric/Behavioral: Positive for dysphoric mood.       Objective:   Physical Exam  Constitutional: He is oriented to person, place, and time. He appears well-developed and well-nourished.  HENT:  Head: Normocephalic and atraumatic.  Eyes: Conjunctivae and EOM are normal. Pupils are equal, round, and reactive to light.  Neurological: He is alert and oriented to person, place, and time. He displays no atrophy and no tremor. No sensory deficit. He exhibits normal muscle tone. Coordination normal.  Motor strength is 5/5 bilateral deltoid, biceps, triceps, grip 5/5 in the right hip flexor and extensor ankle dorsiflexor There is intact sensation to pinprick and light touch bilateral upper limbs as well as right lower limb  Tinel sign over the right elbow is negative No  Skin: Skin is warm, dry and intact. No lesion and no rash noted. No cyanosis. Nails show no clubbing.  Skin over the right foot  Psychiatric: He has a normal mood and affect. His behavior is normal.  Nursing note and vitals reviewed.   Healed incision  over the right volar distal forearm      Assessment & Plan:  1. 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 He is completed inpatient [facility] patient as well as home health [facility]. He still has mild distal edema in his stump but otherwise appears to be well healed and should be ready for prosthetic evaluation. His vascular surgeon wrote an order for this  already and plans to follow him up on a when necessary basis. The patient still requires pain medications including narcotic analgesics on a twice a day basis. Will continue to follow him for his pain management as well as his post prosthetic rehabilitation once he receives his prosthesis.  For now we'll continue oxycodone 10 mg twice a day along with methocarbamol 500 mg twice a day Will increase gabapentin to 400 mg 4 times a day for his phantom limb pain. Plan to reduce the oxycodone 7.5 mg next month  Discussed above findings and recommendations with the patient as well as his wife who is with him today.  2. Right little finger numbness. Physical examination is unremarkable. Given the history of increasing numbness with bending of the elbow I suspect an ulnar neuropathy at the cubital tunnel. Have advised patient to keep the elbow in a extended position avoid pressure over the elbow such as on an armrest. If the symptoms do not improve I would check an EMG/NCV. He is a diabetic so this puts him at increased risk for ulnar neuropathy at the elbow. Other etiologies can also be evaluated.

## 2015-04-22 NOTE — Telephone Encounter (Signed)
Patient's wife came in to the office on 04/19/15 to pick up a package for the pt and Danae Chen gave her an Rx for Hydrocodone dated 02/15/15 that was left at the front desk.  She returned this to [place] and stated the pt does not need this Rx.  D Burchette was informed of this and he stated this was prior to the pts surgery and he does not need this and the Rx was discarded and witnessed by Apolonio Schneiders.

## 2015-04-22 NOTE — Addendum Note (Signed)
Addended by: Miles Costain T on: 04/22/2015 11:43 AM   Modules accepted: Orders

## 2015-04-22 NOTE — Telephone Encounter (Signed)
Sent to the pharmacy by e-scribe for 30 days.  Last filled on 03/15/15 for two months.  Pt has upcoming appt on 05/20/15.

## 2015-04-22 NOTE — Patient Instructions (Addendum)
ER not to receive pain medications such as oxycodone from any other doctor now that I prescribed this medicine for you. I expect that we will reduce the dose to 7.5 mg twice a day next month and down to 5 mg twice a day the following month. I do not think he will need to be on this medication long-term   He may take it with Robaxin 500 mg twice a day  We have increased her gabapentin to 400 mg 4 times per day  Avoid bending your right elbow especially bending and leaning on the armrest with your right elbow We may need to do a nerve study if your right little finger numbness does not improve

## 2015-04-25 ENCOUNTER — Other Ambulatory Visit: Payer: Self-pay | Admitting: Cardiovascular Disease

## 2015-04-25 ENCOUNTER — Other Ambulatory Visit: Payer: Self-pay | Admitting: Pharmacist Clinician (PhC)/ Clinical Pharmacy Specialist

## 2015-04-25 ENCOUNTER — Encounter: Payer: Self-pay | Admitting: Pharmacist Clinician (PhC)/ Clinical Pharmacy Specialist

## 2015-04-25 ENCOUNTER — Ambulatory Visit (INDEPENDENT_AMBULATORY_CARE_PROVIDER_SITE_OTHER): Payer: 59 | Admitting: Pharmacist Clinician (PhC)/ Clinical Pharmacy Specialist

## 2015-04-25 VITALS — BP 136/68 | HR 88 | Ht 69.0 in

## 2015-04-25 DIAGNOSIS — I10 Essential (primary) hypertension: Secondary | ICD-10-CM

## 2015-04-25 DIAGNOSIS — I429 Cardiomyopathy, unspecified: Secondary | ICD-10-CM | POA: Diagnosis not present

## 2015-04-25 DIAGNOSIS — I428 Other cardiomyopathies: Secondary | ICD-10-CM

## 2015-04-25 LAB — BASIC METABOLIC PANEL WITH GFR
BUN: 13 mg/dL (ref 6–23)
CO2: 23 meq/L (ref 19–32)
Calcium: 9.8 mg/dL (ref 8.4–10.5)
Chloride: 104 meq/L (ref 96–112)
Creat: 0.85 mg/dL (ref 0.50–1.35)
Glucose, Bld: 180 mg/dL — ABNORMAL HIGH (ref 70–99)
Potassium: 4.5 meq/L (ref 3.5–5.3)
Sodium: 140 meq/L (ref 135–145)

## 2015-04-25 MED ORDER — CARVEDILOL 12.5 MG PO TABS: 12.5000 mg | ORAL_TABLET | Freq: Two times a day (BID) | ORAL | Status: DC

## 2015-04-25 NOTE — Progress Notes (Signed)
04/25/2015 Lemoine Goyne 1951/10/01 [id number]   HPI:  Eric Price is a 64 y.o. male patient of Dr Gwenlyn Found, with a PMH below who presents today for hypertension clinic evaluation.  He has well controlled BP, however because of low EF was placed on carvedilol and lisinopril.  Carvedilol has been titrated to 6.25 mg as of June.  Lisinopril at 2.5 mg daily.  Patient reports no problems with either medication, had recent BKA after suspected thomboembolic event from nonischemic cardiomyopathy and potential hypercoagulable state.    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  Has not needed insulin since being discharged from hospital.  Eats oatmeal/fruit for breakfast, organic chicken and fresh vegetables commonly for dinner, snacks include fruit and fruit popsicles  Exercise: rehab and OT for BKA, will be fitted for prosthesis in next couple of weeks.     Current medications: carvedilol 6.25 mg bid, lisinopril 2.5 mg qd   Current Outpatient Prescriptions  Medication Sig Dispense Refill  . hydrOXYzine (ATARAX/VISTARIL) 25 MG tablet Take 75 mg by mouth at bedtime as needed for itching.    Marland Kitchen apixaban (ELIQUIS) 5 MG TABS tablet Take 1 tablet (5 mg total) by mouth 2 (two) times daily. 60 tablet 5  . atorvastatin (LIPITOR) 20 MG tablet Take 3 tablets (60 mg total) by mouth daily at 6 PM. 30 tablet 0  . buPROPion (WELLBUTRIN XL) 150 MG 24 hr tablet Take 1 tablet (150 mg total) by mouth daily. 30 tablet 5  . carvedilol (COREG) 6.25 MG tablet Take 1 tablet (6.25 mg total) by mouth 2 (two) times daily with a meal. 60 tablet 11  . clobetasol cream (TEMOVATE) 4.16 % APPLY 1 APPLICATION TOPICALLY TWICE DAILY 30 g 2  . docusate sodium (COLACE) 100 MG capsule Take 1 capsule (100 mg total) by mouth daily. 30 capsule 0  . fenofibrate 54 MG tablet Take 1 tablet (54 mg  total) by mouth daily. Pt. Not instructed to take currently (Patient not taking: Reported on 04/25/2015) 30 tablet 11  . gabapentin (NEURONTIN) 400 MG capsule Take 1 capsule (400 mg total) by mouth 4 (four) times daily. 120 capsule 1  . glucose blood (ONE TOUCH TEST STRIPS) test strip CHECK 2 TIMES DAILY. ONE TOUCH ULTRA TEST STRIPS. DX:250.00 100 each 3  . glyBURIDE (DIABETA) 5 MG tablet TAKE 1 TABLET BY MOUTH TWICE DAILY WITH A MEAL 60 tablet 3  . HUMALOG KWIKPEN 100 UNIT/ML KiwkPen INJECT 5 UNITS INTO THE SKIN TID  5  . LEVEMIR FLEXTOUCH 100 UNIT/ML Pen INJECT 30 UNITS QHS  11  . lisinopril (PRINIVIL,ZESTRIL) 2.5 MG tablet Take 1 tablet (2.5 mg total) by mouth daily. 90 tablet 3  . metFORMIN (GLUCOPHAGE) 500 MG tablet Take 2 tablets (1,000 mg total) by mouth 2 (two) times daily with a meal. 120 tablet 2  . methocarbamol (ROBAXIN) 500 MG tablet Take 1 tablet (500 mg total) by mouth 2 (two) times daily. 60 tablet 1  . nitroGLYCERIN (NITROSTAT) 0.4 MG SL tablet Place 1 tablet (0.4 mg total) under the tongue every 5 (five) minutes as needed for chest pain. 60 tablet 12  . ONE TOUCH LANCETS MISC Check 2 times daily. 100 each 3  . oxyCODONE-acetaminophen (PERCOCET) 10-325 MG per tablet Take 1 tablet by mouth 2 (two) times daily. 60 tablet 0  . pantoprazole (  PROTONIX) 40 MG tablet Take 1 tablet (40 mg total) by mouth daily. 30 tablet 1  . sertraline (ZOLOFT) 100 MG tablet Take 1 tablet (100 mg total) by mouth daily. 30 tablet 5  . tamsulosin (FLOMAX) 0.4 MG CAPS capsule Take 1 capsule (0.4 mg total) by mouth daily. 30 capsule 5  . triamcinolone cream (KENALOG) 0.1 % Apply 1 application topically daily as needed (affected areas).      No current facility-administered medications for this visit.    Allergies  Allergen Reactions  . Morphine Shortness Of Breath and Anaphylaxis  . Morphine And Related     "took my breath away"    Past Medical History  Diagnosis Date  . Depression   .  Hyperlipidemia   . Hypertension   . Critical lower limb ischemia   . Sleep apnea     10-20 yrs. ago, states he used CPAP, not needed anymore.   . Coronary artery disease   . Shortness of breath dyspnea     related to pain currently  . Peripheral vascular disease   . Pneumonia 2013    hosp. - [facility] x1 week   . Type II diabetes mellitus   . Arthritis     knees, shoulder, hands   . 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   . Nonischemic cardiomyopathy     Blood pressure 136/68, pulse 88, height 5' 9"  (1.753 m).    Tommy Medal PharmD CPP [facility]

## 2015-04-25 NOTE — Patient Instructions (Signed)
Return for a a follow up appointment in 1 month  Your blood pressure today is 136/68, HR 88  Take your BP meds as follows: increase carvedilol to 12.5 mg twice daily

## 2015-04-25 NOTE — Assessment & Plan Note (Signed)
Pt feeling fine in office today.  No problems with dizziness/lightheadedness on positional changes (laying to sitting).  HR still at 88.  BP good at 136/88.  Will increase carvedilol to 12.5 mg bid and see patient in 1 month for follow up.  Pt to go to lab today for BMET after recent start of lisinopril.  Pt to return in 4 weeks for follow up and further titration if needed.

## 2015-05-03 ENCOUNTER — Other Ambulatory Visit: Payer: Self-pay | Admitting: Family Medicine

## 2015-05-13 ENCOUNTER — Ambulatory Visit: Payer: 59 | Attending: Vascular Surgery | Admitting: Physical Therapy

## 2015-05-13 ENCOUNTER — Encounter: Payer: Self-pay | Admitting: Physical Therapy

## 2015-05-13 DIAGNOSIS — R269 Unspecified abnormalities of gait and mobility: Secondary | ICD-10-CM | POA: Diagnosis not present

## 2015-05-13 DIAGNOSIS — Z89512 Acquired absence of left leg below knee: Secondary | ICD-10-CM | POA: Insufficient documentation

## 2015-05-13 DIAGNOSIS — R6889 Other general symptoms and signs: Secondary | ICD-10-CM

## 2015-05-13 DIAGNOSIS — R531 Weakness: Secondary | ICD-10-CM | POA: Insufficient documentation

## 2015-05-13 DIAGNOSIS — R2681 Unsteadiness on feet: Secondary | ICD-10-CM | POA: Diagnosis present
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
## 2015-05-16 ENCOUNTER — Ambulatory Visit: Payer: 59 | Admitting: Physical Therapy

## 2015-05-16 ENCOUNTER — Encounter: Payer: Self-pay | Admitting: Physical Therapy

## 2015-05-16 DIAGNOSIS — Z89512 Acquired absence of left leg below knee: Secondary | ICD-10-CM

## 2015-05-16 DIAGNOSIS — R6889 Other general symptoms and signs: Secondary | ICD-10-CM

## 2015-05-16 DIAGNOSIS — R531 Weakness: Secondary | ICD-10-CM

## 2015-05-16 DIAGNOSIS — R269 Unspecified abnormalities of gait and mobility: Secondary | ICD-10-CM | POA: Diagnosis not present

## 2015-05-16 DIAGNOSIS — R2681 Unsteadiness on feet: Secondary | ICD-10-CM
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
## 2015-05-20 ENCOUNTER — Encounter: Payer: Self-pay | Admitting: Family Medicine

## 2015-05-20 ENCOUNTER — Ambulatory Visit (INDEPENDENT_AMBULATORY_CARE_PROVIDER_SITE_OTHER): Payer: 59 | Admitting: Family Medicine

## 2015-05-20 VITALS — BP 120/78 | HR 91 | Temp 97.9°F | Wt 211.0 lb

## 2015-05-20 DIAGNOSIS — E1165 Type 2 diabetes mellitus with hyperglycemia: Secondary | ICD-10-CM

## 2015-05-20 DIAGNOSIS — E1142 Type 2 diabetes mellitus with diabetic polyneuropathy: Secondary | ICD-10-CM

## 2015-05-20 DIAGNOSIS — IMO0002 Reserved for concepts with insufficient information to code with codable children: Secondary | ICD-10-CM

## 2015-05-20 LAB — HEMOGLOBIN A1C: Hgb A1c MFr Bld: 6.7 % — ABNORMAL HIGH (ref 4.6–6.5)
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
## 2015-05-20 NOTE — Progress Notes (Signed)
Pre visit review using our clinic review tool, if applicable. No additional management support is needed unless otherwise documented below in the visit note. 

## 2015-05-23 ENCOUNTER — Encounter: Payer: Self-pay | Admitting: Physical Therapy

## 2015-05-23 ENCOUNTER — Ambulatory Visit (INDEPENDENT_AMBULATORY_CARE_PROVIDER_SITE_OTHER): Payer: 59 | Admitting: Pharmacist Clinician (PhC)/ Clinical Pharmacy Specialist

## 2015-05-23 ENCOUNTER — Encounter: Payer: Self-pay | Admitting: Pharmacist Clinician (PhC)/ Clinical Pharmacy Specialist

## 2015-05-23 ENCOUNTER — Ambulatory Visit: Payer: 59 | Admitting: Physical Therapy

## 2015-05-23 VITALS — BP 118/68 | HR 80 | Ht 69.0 in | Wt 210.9 lb

## 2015-05-23 DIAGNOSIS — R269 Unspecified abnormalities of gait and mobility: Secondary | ICD-10-CM

## 2015-05-23 DIAGNOSIS — R2681 Unsteadiness on feet: Secondary | ICD-10-CM

## 2015-05-23 DIAGNOSIS — R531 Weakness: Secondary | ICD-10-CM

## 2015-05-23 DIAGNOSIS — Z89512 Acquired absence of left leg below knee: Secondary | ICD-10-CM

## 2015-05-23 DIAGNOSIS — I429 Cardiomyopathy, unspecified: Secondary | ICD-10-CM

## 2015-05-23 DIAGNOSIS — I428 Other cardiomyopathies: Secondary | ICD-10-CM

## 2015-05-23 DIAGNOSIS — R6889 Other general symptoms and signs: Secondary | ICD-10-CM

## 2015-05-23 MED ORDER — CARVEDILOL 25 MG PO TABS: 25.0000 mg | ORAL_TABLET | Freq: Two times a day (BID) | ORAL | Status: DC
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
## 2015-05-23 NOTE — Assessment & Plan Note (Signed)
Patient has tolerated increases in dose of carvedilol without problem.  Today will increase to 25 mg bid.  He is aware to call if he develops any side effects or has concerns about the medication.

## 2015-05-23 NOTE — Patient Instructions (Signed)
Increase you carvedilol to 25 mg twice daily.  Call if you develop any swelling, shortness of breath or fatigue.

## 2015-05-23 NOTE — Progress Notes (Signed)
05/23/2015 Eric Price August 29, 1951 [id number]   HPI:  Eric Price is a 64 y.o. male patient of Dr Gwenlyn Found, with a PMH below who presents today for medication management follow up.  He has well controlled BP, however because of low EF was placed on carvedilol and lisinopril.  We are in the process of up-titrating his carvedilol, he is currently on 12.5 mg bid.  Patient reports no problems with either medication, and last BMET showed stable kidney function.  Patient had recent BKA after suspected thomboembolic event from nonischemic cardiomyopathy and potential hypercoagulable state, since I saw him last he has received a prosthetic leg and is walking well with the aide of a walker.  He attends therapy regularly   Cardiac Hx: EF 25-35%, thought to be chemotherapy related, DM   Social Hx: does not smoke or drink alcohol, occasional caffeine in tea  Diet: because of DM watches his diet closely.  Has not needed insulin since being discharged from hospital.  Eats oatmeal/fruit for breakfast, organic chicken and fresh vegetables commonly for dinner, snacks include fruit and fruit popsicles.  Last A1c dropped from 8.1 to 6.7.  Exercise: rehab and OT for BKA, will be fitted for prosthesis in next couple of weeks.     Current medications: carvedilol 12.5 mg bid, lisinopril 2.5 mg qd   Current Outpatient Prescriptions  Medication Sig Dispense Refill  . apixaban (ELIQUIS) 5 MG TABS tablet Take 1 tablet (5 mg total) by mouth 2 (two) times daily. 60 tablet 5  . atorvastatin (LIPITOR) 20 MG tablet TAKE 3 TABLETS(60 MG) BY MOUTH DAILY AT 6 PM 30 tablet 5  . buPROPion (WELLBUTRIN XL) 150 MG 24 hr tablet Take 1 tablet (150 mg total) by mouth daily. 30 tablet 5  . carvedilol (COREG) 12.5 MG tablet Take 1 tablet (12.5 mg total) by mouth 2 (two) times daily. 60 tablet 3  . clobetasol cream (TEMOVATE) 0.17 % APPLY 1 APPLICATION TOPICALLY TWICE DAILY 30 g 2  . docusate sodium (COLACE) 100 MG capsule Take 1  capsule (100 mg total) by mouth daily. 30 capsule 0  . fenofibrate 54 MG tablet Take 1 tablet (54 mg total) by mouth daily. Pt. Not instructed to take currently 30 tablet 11  . gabapentin (NEURONTIN) 400 MG capsule Take 1 capsule (400 mg total) by mouth 4 (four) times daily. 120 capsule 1  . glucose blood (ONE TOUCH TEST STRIPS) test strip CHECK 2 TIMES DAILY. ONE TOUCH ULTRA TEST STRIPS. DX:250.00 100 each 3  . glyBURIDE (DIABETA) 5 MG tablet TAKE 1 TABLET BY MOUTH TWICE DAILY WITH A MEAL 60 tablet 3  . HUMALOG KWIKPEN 100 UNIT/ML KiwkPen INJECT 5 UNITS INTO THE SKIN TID  5  . hydrOXYzine (ATARAX/VISTARIL) 25 MG tablet Take 75 mg by mouth at bedtime as needed for itching.    Marland Kitchen lisinopril (PRINIVIL,ZESTRIL) 2.5 MG tablet Take 1 tablet (2.5 mg total) by mouth daily. 90 tablet 3  . metFORMIN (GLUCOPHAGE) 500 MG tablet Take 2 tablets (1,000 mg total) by mouth 2 (two) times daily with a meal. 120 tablet 2  . methocarbamol (ROBAXIN) 500 MG tablet Take 1 tablet (500 mg total) by mouth 2 (two) times daily. 60 tablet 1  . nitroGLYCERIN (NITROSTAT) 0.4 MG SL tablet Place 1 tablet (0.4 mg total) under the tongue every 5 (five) minutes as needed for chest pain. 60 tablet 12  . ONE TOUCH LANCETS MISC Check 2 times daily. 100 each 3  . oxyCODONE-acetaminophen (PERCOCET)  10-325 MG per tablet Take 1 tablet by mouth 2 (two) times daily. 60 tablet 0  . pantoprazole (PROTONIX) 40 MG tablet Take 1 tablet (40 mg total) by mouth daily. 30 tablet 1  . sertraline (ZOLOFT) 100 MG tablet Take 1 tablet (100 mg total) by mouth daily. 30 tablet 5  . tamsulosin (FLOMAX) 0.4 MG CAPS capsule Take 1 capsule (0.4 mg total) by mouth daily. 30 capsule 5  . triamcinolone cream (KENALOG) 0.1 % Apply 1 application topically daily as needed (affected areas).      No current facility-administered medications for this visit.    Allergies  Allergen Reactions  . Morphine Shortness Of Breath and Anaphylaxis  . Morphine And Related      "took my breath away"    Past Medical History  Diagnosis Date  . Depression   . Hyperlipidemia   . Hypertension   . Critical lower limb ischemia   . Sleep apnea     10-20 yrs. ago, states he used CPAP, not needed anymore.   . Coronary artery disease   . Shortness of breath dyspnea     related to pain currently  . Peripheral vascular disease   . Pneumonia 2013    hosp. - [facility] x1 week   . Type II diabetes mellitus   . Arthritis     knees, shoulder, hands   . 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   . Nonischemic cardiomyopathy     Blood pressure 118/68, pulse 80, height 5' 9"  (1.753 m), weight 210 lb 14.4 oz (95.664 kg).    Tommy Medal PharmD CPP [facility]

## 2015-05-24 ENCOUNTER — Encounter: Payer: 59 | Attending: Physical Medicine & Rehabilitation

## 2015-05-24 ENCOUNTER — Encounter: Payer: Self-pay | Admitting: Physical Medicine & Rehabilitation

## 2015-05-24 ENCOUNTER — Ambulatory Visit: Payer: 59 | Admitting: Physical Therapy

## 2015-05-24 ENCOUNTER — Ambulatory Visit (HOSPITAL_BASED_OUTPATIENT_CLINIC_OR_DEPARTMENT_OTHER): Payer: 59 | Admitting: Physical Medicine & Rehabilitation

## 2015-05-24 ENCOUNTER — Encounter: Payer: Self-pay | Admitting: Physical Therapy

## 2015-05-24 VITALS — BP 133/70 | HR 91

## 2015-05-24 DIAGNOSIS — R269 Unspecified abnormalities of gait and mobility: Secondary | ICD-10-CM

## 2015-05-24 DIAGNOSIS — R6889 Other general symptoms and signs: Secondary | ICD-10-CM

## 2015-05-24 DIAGNOSIS — R2681 Unsteadiness on feet: Secondary | ICD-10-CM

## 2015-05-24 DIAGNOSIS — G546 Phantom limb syndrome with pain: Secondary | ICD-10-CM | POA: Diagnosis not present

## 2015-05-24 DIAGNOSIS — R200 Anesthesia of skin: Secondary | ICD-10-CM | POA: Diagnosis not present

## 2015-05-24 DIAGNOSIS — R531 Weakness: Secondary | ICD-10-CM

## 2015-05-24 DIAGNOSIS — Z89512 Acquired absence of left leg below knee: Secondary | ICD-10-CM | POA: Diagnosis not present

## 2015-05-24 MED ORDER — GABAPENTIN 600 MG PO TABS: 600.0000 mg | ORAL_TABLET | Freq: Four times a day (QID) | ORAL | Status: DC

## 2015-05-24 MED ORDER — OXYCODONE-ACETAMINOPHEN 7.5-325 MG PO TABS: 1.0000 | ORAL_TABLET | Freq: Two times a day (BID) | ORAL | Status: DC | PRN
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
## 2015-05-24 NOTE — Patient Instructions (Signed)
Next month we plan to reduce the oxycodone to 5 mg We have increased the gabapentin to 600 mg 4 times per day for the phantom pain.  We discussed that nerve blocks may be a possibility if he don't have a reduction in phantom pain
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
## 2015-05-27 ENCOUNTER — Encounter: Payer: Self-pay | Admitting: Physical Therapy

## 2015-05-27 ENCOUNTER — Ambulatory Visit: Payer: 59 | Admitting: Physical Therapy

## 2015-05-27 DIAGNOSIS — Z89512 Acquired absence of left leg below knee: Secondary | ICD-10-CM

## 2015-05-27 DIAGNOSIS — R6889 Other general symptoms and signs: Secondary | ICD-10-CM

## 2015-05-27 DIAGNOSIS — R269 Unspecified abnormalities of gait and mobility: Secondary | ICD-10-CM | POA: Diagnosis not present
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
## 2015-05-29 ENCOUNTER — Encounter: Payer: 59 | Admitting: Physical Therapy

## 2015-05-29 ENCOUNTER — Other Ambulatory Visit: Payer: Self-pay | Admitting: Physical Medicine & Rehabilitation

## 2015-05-30 ENCOUNTER — Encounter: Payer: Self-pay | Admitting: Physical Therapy

## 2015-05-30 ENCOUNTER — Ambulatory Visit: Payer: 59 | Admitting: Physical Therapy

## 2015-05-30 DIAGNOSIS — R531 Weakness: Secondary | ICD-10-CM

## 2015-05-30 DIAGNOSIS — Z89512 Acquired absence of left leg below knee: Secondary | ICD-10-CM

## 2015-05-30 DIAGNOSIS — R2681 Unsteadiness on feet: Secondary | ICD-10-CM

## 2015-05-30 DIAGNOSIS — R6889 Other general symptoms and signs: Secondary | ICD-10-CM

## 2015-05-30 DIAGNOSIS — R269 Unspecified abnormalities of gait and mobility: Secondary | ICD-10-CM | POA: Diagnosis not present
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
## 2015-05-31 ENCOUNTER — Encounter: Payer: 59 | Admitting: Physical Therapy

## 2015-06-04 ENCOUNTER — Encounter: Payer: Self-pay | Admitting: Physical Therapy

## 2015-06-04 ENCOUNTER — Ambulatory Visit: Payer: 59 | Admitting: Physical Therapy

## 2015-06-04 DIAGNOSIS — Z89512 Acquired absence of left leg below knee: Secondary | ICD-10-CM

## 2015-06-04 DIAGNOSIS — R531 Weakness: Secondary | ICD-10-CM

## 2015-06-04 DIAGNOSIS — R2681 Unsteadiness on feet: Secondary | ICD-10-CM

## 2015-06-04 DIAGNOSIS — R269 Unspecified abnormalities of gait and mobility: Secondary | ICD-10-CM | POA: Diagnosis not present
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
## 2015-06-06 DIAGNOSIS — C8400 Mycosis fungoides, unspecified site: Secondary | ICD-10-CM | POA: Diagnosis not present

## 2015-06-11 ENCOUNTER — Encounter: Payer: 59 | Admitting: Physical Therapy

## 2015-06-12 ENCOUNTER — Ambulatory Visit: Payer: Medicare Other | Attending: Vascular Surgery | Admitting: Physical Therapy

## 2015-06-12 DIAGNOSIS — R2681 Unsteadiness on feet: Secondary | ICD-10-CM | POA: Insufficient documentation

## 2015-06-12 DIAGNOSIS — Z5189 Encounter for other specified aftercare: Secondary | ICD-10-CM | POA: Insufficient documentation

## 2015-06-12 DIAGNOSIS — Z89512 Acquired absence of left leg below knee: Secondary | ICD-10-CM | POA: Insufficient documentation

## 2015-06-12 DIAGNOSIS — R6889 Other general symptoms and signs: Secondary | ICD-10-CM | POA: Insufficient documentation

## 2015-06-12 DIAGNOSIS — R269 Unspecified abnormalities of gait and mobility: Secondary | ICD-10-CM | POA: Insufficient documentation

## 2015-06-12 DIAGNOSIS — R531 Weakness: Secondary | ICD-10-CM | POA: Insufficient documentation

## 2015-06-13 ENCOUNTER — Encounter: Payer: Self-pay | Admitting: Physical Therapy

## 2015-06-13 ENCOUNTER — Ambulatory Visit: Payer: Medicare Other | Admitting: Physical Therapy

## 2015-06-13 ENCOUNTER — Encounter: Payer: 59 | Admitting: Physical Therapy

## 2015-06-13 DIAGNOSIS — R269 Unspecified abnormalities of gait and mobility: Secondary | ICD-10-CM | POA: Diagnosis not present

## 2015-06-13 DIAGNOSIS — R531 Weakness: Secondary | ICD-10-CM | POA: Diagnosis not present

## 2015-06-13 DIAGNOSIS — R2681 Unsteadiness on feet: Secondary | ICD-10-CM

## 2015-06-13 DIAGNOSIS — Z4789 Encounter for other orthopedic aftercare: Secondary | ICD-10-CM

## 2015-06-13 DIAGNOSIS — R6889 Other general symptoms and signs: Secondary | ICD-10-CM

## 2015-06-13 DIAGNOSIS — Z89512 Acquired absence of left leg below knee: Secondary | ICD-10-CM | POA: Diagnosis not present

## 2015-06-13 DIAGNOSIS — Z5189 Encounter for other specified aftercare: Secondary | ICD-10-CM | POA: Diagnosis not present
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
## 2015-06-18 ENCOUNTER — Encounter: Payer: 59 | Admitting: Physical Therapy

## 2015-06-18 ENCOUNTER — Encounter: Payer: Self-pay | Admitting: Physical Therapy

## 2015-06-18 ENCOUNTER — Ambulatory Visit: Payer: Medicare Other | Admitting: Physical Therapy

## 2015-06-18 DIAGNOSIS — Z89512 Acquired absence of left leg below knee: Secondary | ICD-10-CM

## 2015-06-18 DIAGNOSIS — R6889 Other general symptoms and signs: Secondary | ICD-10-CM | POA: Diagnosis not present

## 2015-06-18 DIAGNOSIS — R269 Unspecified abnormalities of gait and mobility: Secondary | ICD-10-CM

## 2015-06-18 DIAGNOSIS — L299 Pruritus, unspecified: Secondary | ICD-10-CM | POA: Diagnosis not present

## 2015-06-18 DIAGNOSIS — R531 Weakness: Secondary | ICD-10-CM

## 2015-06-18 DIAGNOSIS — R2681 Unsteadiness on feet: Secondary | ICD-10-CM | POA: Diagnosis not present

## 2015-06-18 DIAGNOSIS — Z5189 Encounter for other specified aftercare: Secondary | ICD-10-CM | POA: Diagnosis not present

## 2015-06-18 DIAGNOSIS — C8400 Mycosis fungoides, unspecified site: Secondary | ICD-10-CM | POA: Diagnosis not present
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
## 2015-06-20 ENCOUNTER — Ambulatory Visit: Payer: Medicare Other | Admitting: Physical Therapy

## 2015-06-20 ENCOUNTER — Encounter: Payer: 59 | Admitting: Physical Therapy

## 2015-06-20 ENCOUNTER — Encounter: Payer: Self-pay | Admitting: Physical Therapy

## 2015-06-20 DIAGNOSIS — R531 Weakness: Secondary | ICD-10-CM | POA: Diagnosis not present

## 2015-06-20 DIAGNOSIS — Z89512 Acquired absence of left leg below knee: Secondary | ICD-10-CM | POA: Diagnosis not present

## 2015-06-20 DIAGNOSIS — R2681 Unsteadiness on feet: Secondary | ICD-10-CM

## 2015-06-20 DIAGNOSIS — R269 Unspecified abnormalities of gait and mobility: Secondary | ICD-10-CM | POA: Diagnosis not present

## 2015-06-20 DIAGNOSIS — R6889 Other general symptoms and signs: Secondary | ICD-10-CM

## 2015-06-20 DIAGNOSIS — Z5189 Encounter for other specified aftercare: Secondary | ICD-10-CM | POA: Diagnosis not present

## 2015-06-20 DIAGNOSIS — Z4789 Encounter for other orthopedic aftercare: Secondary | ICD-10-CM

## 2015-06-21 ENCOUNTER — Encounter: Payer: 59 | Attending: Physical Medicine & Rehabilitation

## 2015-06-21 ENCOUNTER — Ambulatory Visit: Payer: 59 | Admitting: Physical Medicine & Rehabilitation

## 2015-06-21 DIAGNOSIS — G546 Phantom limb syndrome with pain: Secondary | ICD-10-CM | POA: Insufficient documentation

## 2015-06-21 DIAGNOSIS — R200 Anesthesia of skin: Secondary | ICD-10-CM | POA: Insufficient documentation
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
## 2015-06-25 ENCOUNTER — Encounter: Payer: 59 | Admitting: Physical Therapy

## 2015-06-25 ENCOUNTER — Encounter: Payer: Self-pay | Admitting: Physical Therapy

## 2015-06-25 ENCOUNTER — Ambulatory Visit: Payer: Medicare Other | Admitting: Physical Therapy

## 2015-06-25 DIAGNOSIS — Z89512 Acquired absence of left leg below knee: Secondary | ICD-10-CM

## 2015-06-25 DIAGNOSIS — R2681 Unsteadiness on feet: Secondary | ICD-10-CM | POA: Diagnosis not present

## 2015-06-25 DIAGNOSIS — R531 Weakness: Secondary | ICD-10-CM

## 2015-06-25 DIAGNOSIS — R269 Unspecified abnormalities of gait and mobility: Secondary | ICD-10-CM

## 2015-06-25 DIAGNOSIS — R6889 Other general symptoms and signs: Secondary | ICD-10-CM

## 2015-06-25 DIAGNOSIS — Z5189 Encounter for other specified aftercare: Secondary | ICD-10-CM | POA: Diagnosis not present
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
## 2015-06-27 ENCOUNTER — Encounter: Payer: Self-pay | Admitting: Physical Therapy

## 2015-06-27 ENCOUNTER — Ambulatory Visit: Payer: Medicare Other | Admitting: Physical Therapy

## 2015-06-27 ENCOUNTER — Encounter: Payer: 59 | Admitting: Physical Therapy

## 2015-06-27 DIAGNOSIS — R2681 Unsteadiness on feet: Secondary | ICD-10-CM | POA: Diagnosis not present

## 2015-06-27 DIAGNOSIS — R269 Unspecified abnormalities of gait and mobility: Secondary | ICD-10-CM | POA: Diagnosis not present

## 2015-06-27 DIAGNOSIS — Z89512 Acquired absence of left leg below knee: Secondary | ICD-10-CM

## 2015-06-27 DIAGNOSIS — R6889 Other general symptoms and signs: Secondary | ICD-10-CM

## 2015-06-27 DIAGNOSIS — Z5189 Encounter for other specified aftercare: Secondary | ICD-10-CM | POA: Diagnosis not present

## 2015-06-27 DIAGNOSIS — R531 Weakness: Secondary | ICD-10-CM

## 2015-06-27 DIAGNOSIS — Z4789 Encounter for other orthopedic aftercare: Secondary | ICD-10-CM

## 2015-06-28 ENCOUNTER — Other Ambulatory Visit (HOSPITAL_COMMUNITY): Payer: 59
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
## 2015-07-02 ENCOUNTER — Encounter: Payer: 59 | Admitting: Physical Therapy

## 2015-07-02 ENCOUNTER — Encounter: Payer: Self-pay | Admitting: Physical Therapy

## 2015-07-02 ENCOUNTER — Ambulatory Visit: Payer: Medicare Other | Admitting: Physical Therapy

## 2015-07-02 DIAGNOSIS — Z4789 Encounter for other orthopedic aftercare: Secondary | ICD-10-CM

## 2015-07-02 DIAGNOSIS — R531 Weakness: Secondary | ICD-10-CM

## 2015-07-02 DIAGNOSIS — R269 Unspecified abnormalities of gait and mobility: Secondary | ICD-10-CM

## 2015-07-02 DIAGNOSIS — R2681 Unsteadiness on feet: Secondary | ICD-10-CM

## 2015-07-02 DIAGNOSIS — Z5189 Encounter for other specified aftercare: Secondary | ICD-10-CM | POA: Diagnosis not present

## 2015-07-02 DIAGNOSIS — R6889 Other general symptoms and signs: Secondary | ICD-10-CM

## 2015-07-02 DIAGNOSIS — Z89512 Acquired absence of left leg below knee: Secondary | ICD-10-CM | POA: Diagnosis not present
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
## 2015-07-04 ENCOUNTER — Encounter: Payer: 59 | Admitting: Physical Therapy

## 2015-07-04 ENCOUNTER — Encounter: Payer: Self-pay | Admitting: Physical Therapy

## 2015-07-04 ENCOUNTER — Ambulatory Visit: Payer: Medicare Other | Admitting: Physical Therapy

## 2015-07-04 DIAGNOSIS — R2681 Unsteadiness on feet: Secondary | ICD-10-CM

## 2015-07-04 DIAGNOSIS — R269 Unspecified abnormalities of gait and mobility: Secondary | ICD-10-CM

## 2015-07-04 DIAGNOSIS — Z89512 Acquired absence of left leg below knee: Secondary | ICD-10-CM

## 2015-07-04 DIAGNOSIS — R6889 Other general symptoms and signs: Secondary | ICD-10-CM | POA: Diagnosis not present

## 2015-07-04 DIAGNOSIS — Z4789 Encounter for other orthopedic aftercare: Secondary | ICD-10-CM

## 2015-07-04 DIAGNOSIS — Z5189 Encounter for other specified aftercare: Secondary | ICD-10-CM | POA: Diagnosis not present

## 2015-07-04 DIAGNOSIS — R531 Weakness: Secondary | ICD-10-CM | POA: Diagnosis not present
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
## 2015-07-09 ENCOUNTER — Encounter: Payer: Self-pay | Admitting: Physical Therapy

## 2015-07-09 ENCOUNTER — Other Ambulatory Visit (HOSPITAL_COMMUNITY): Payer: BC Managed Care – PPO | Admitting: *Deleted

## 2015-07-09 ENCOUNTER — Other Ambulatory Visit: Payer: Self-pay

## 2015-07-09 ENCOUNTER — Encounter: Payer: 59 | Admitting: Physical Therapy

## 2015-07-09 ENCOUNTER — Ambulatory Visit (HOSPITAL_COMMUNITY): Payer: Medicare Other | Attending: Cardiology

## 2015-07-09 ENCOUNTER — Ambulatory Visit: Payer: BC Managed Care – PPO | Attending: Vascular Surgery | Admitting: Physical Therapy

## 2015-07-09 DIAGNOSIS — R6889 Other general symptoms and signs: Secondary | ICD-10-CM | POA: Insufficient documentation

## 2015-07-09 DIAGNOSIS — R269 Unspecified abnormalities of gait and mobility: Secondary | ICD-10-CM | POA: Insufficient documentation

## 2015-07-09 DIAGNOSIS — I519 Heart disease, unspecified: Secondary | ICD-10-CM | POA: Insufficient documentation

## 2015-07-09 DIAGNOSIS — I340 Nonrheumatic mitral (valve) insufficiency: Secondary | ICD-10-CM | POA: Diagnosis not present

## 2015-07-09 DIAGNOSIS — E119 Type 2 diabetes mellitus without complications: Secondary | ICD-10-CM | POA: Diagnosis not present

## 2015-07-09 DIAGNOSIS — Z6831 Body mass index (BMI) 31.0-31.9, adult: Secondary | ICD-10-CM | POA: Insufficient documentation

## 2015-07-09 DIAGNOSIS — E785 Hyperlipidemia, unspecified: Secondary | ICD-10-CM | POA: Diagnosis not present

## 2015-07-09 DIAGNOSIS — Z89512 Acquired absence of left leg below knee: Secondary | ICD-10-CM

## 2015-07-09 DIAGNOSIS — R2681 Unsteadiness on feet: Secondary | ICD-10-CM | POA: Diagnosis not present

## 2015-07-09 DIAGNOSIS — Z5189 Encounter for other specified aftercare: Secondary | ICD-10-CM | POA: Diagnosis not present

## 2015-07-09 DIAGNOSIS — R531 Weakness: Secondary | ICD-10-CM | POA: Diagnosis not present

## 2015-07-09 DIAGNOSIS — E669 Obesity, unspecified: Secondary | ICD-10-CM | POA: Insufficient documentation

## 2015-07-09 DIAGNOSIS — I10 Essential (primary) hypertension: Secondary | ICD-10-CM | POA: Diagnosis not present

## 2015-07-09 DIAGNOSIS — Z4789 Encounter for other orthopedic aftercare: Secondary | ICD-10-CM

## 2015-07-09 MED ORDER — PERFLUTREN LIPID MICROSPHERE
1.0000 mL | INTRAVENOUS | Status: AC | PRN
  Administered 2015-07-09: 1 mL via INTRAVENOUS
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
## 2015-07-11 ENCOUNTER — Ambulatory Visit: Payer: BC Managed Care – PPO | Admitting: Physical Therapy

## 2015-07-11 ENCOUNTER — Encounter: Payer: Self-pay | Admitting: Physical Therapy

## 2015-07-11 ENCOUNTER — Encounter: Payer: 59 | Admitting: Physical Therapy

## 2015-07-11 DIAGNOSIS — Z89512 Acquired absence of left leg below knee: Secondary | ICD-10-CM | POA: Diagnosis not present

## 2015-07-11 DIAGNOSIS — R269 Unspecified abnormalities of gait and mobility: Secondary | ICD-10-CM

## 2015-07-11 DIAGNOSIS — Z4789 Encounter for other orthopedic aftercare: Secondary | ICD-10-CM

## 2015-07-11 DIAGNOSIS — R6889 Other general symptoms and signs: Secondary | ICD-10-CM | POA: Diagnosis not present

## 2015-07-11 DIAGNOSIS — R2681 Unsteadiness on feet: Secondary | ICD-10-CM

## 2015-07-11 DIAGNOSIS — Z5189 Encounter for other specified aftercare: Secondary | ICD-10-CM | POA: Diagnosis not present

## 2015-07-11 DIAGNOSIS — R531 Weakness: Secondary | ICD-10-CM | POA: Diagnosis not present
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
## 2015-07-21 ENCOUNTER — Other Ambulatory Visit: Payer: Self-pay | Admitting: Physical Medicine & Rehabilitation

## 2015-07-24 ENCOUNTER — Encounter: Payer: Self-pay | Admitting: Cardiovascular Disease

## 2015-07-24 ENCOUNTER — Ambulatory Visit (INDEPENDENT_AMBULATORY_CARE_PROVIDER_SITE_OTHER): Payer: Medicare Other | Admitting: Cardiovascular Disease

## 2015-07-24 VITALS — BP 146/76 | HR 86 | Ht 68.0 in | Wt 221.0 lb

## 2015-07-24 DIAGNOSIS — I429 Cardiomyopathy, unspecified: Secondary | ICD-10-CM

## 2015-07-24 DIAGNOSIS — I998 Other disorder of circulatory system: Secondary | ICD-10-CM

## 2015-07-24 DIAGNOSIS — I428 Other cardiomyopathies: Secondary | ICD-10-CM

## 2015-07-24 DIAGNOSIS — I519 Heart disease, unspecified: Secondary | ICD-10-CM | POA: Diagnosis not present

## 2015-07-24 DIAGNOSIS — I779 Disorder of arteries and arterioles, unspecified: Secondary | ICD-10-CM | POA: Diagnosis not present

## 2015-07-24 DIAGNOSIS — E785 Hyperlipidemia, unspecified: Secondary | ICD-10-CM | POA: Diagnosis not present

## 2015-07-24 DIAGNOSIS — I10 Essential (primary) hypertension: Secondary | ICD-10-CM | POA: Diagnosis not present

## 2015-07-24 DIAGNOSIS — I70229 Atherosclerosis of native arteries of extremities with rest pain, unspecified extremity: Secondary | ICD-10-CM

## 2015-07-24 NOTE — Assessment & Plan Note (Addendum)
History of critical limb ischemia status post peripheral angiogram 02/21/2015 revealing occluded profunda femoris and occluded tibial vessels on the left with a gangrenous left great toe. He subsequently underwent left great toe amputation by Dr. 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 The  Etiology of the thromboembolic event was unknown. He was placed on Eliquis oral anticoagulation

## 2015-07-24 NOTE — Assessment & Plan Note (Signed)
History of hyperlipidemia on atorvastatin 60 mg a day with recent lipid profile performed 02/21/15 revealing total cholesterol 95, LDL 114 from 75 months before and HDL 27. He does admit to dietary indiscretion.  Going to have him change his diet next month and we'll recheck. If remains elevated we make consider altering his pharmacologic regimen.

## 2015-07-24 NOTE — Assessment & Plan Note (Signed)
Eric Price has a history of nonischemic cardiopathy with a recent 2-D echo performed 07/09/15 revealing an ejection fraction of 30-35%. He did undergo cardiac catheterization by myself 02/21/15 revealing an EF in the 25-35% range as well with 75% distal RCA lesion thought to be unrelated to his degree of LV dysfunction. He is on optimal medical therapy. We talked about the possibility of sudden cardiac death. I am referring him to Dr. Sallyanne Kuster for consideration of ICD implantation for primary prevention.

## 2015-07-24 NOTE — Patient Instructions (Signed)
Medication Instructions:  Your physician recommends that you continue on your current medications as directed. Please refer to the Current Medication list given to you today.   Labwork: Your physician recommends that you return for lab work in:  [place] The lab can be found on the FIRST FLOOR of out building in [address]   Testing/Procedures: none  Follow-Up: Your physician recommends that you schedule a follow-up appointment: [place] DR. Sallyanne Kuster FOR ICD implantation  Your physician wants you to follow-up in: 12 months with Dr. Gwenlyn Found. You will receive a reminder letter in the mail two months in advance. If you don't receive a letter, please call our office to schedule the follow-up appointment.    Any Other Special Instructions Will Be Listed Below (If Applicable).
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
## 2015-08-06 ENCOUNTER — Encounter: Payer: Self-pay | Admitting: Adult Health

## 2015-08-06 ENCOUNTER — Other Ambulatory Visit: Payer: Self-pay | Admitting: Family Medicine

## 2015-08-06 ENCOUNTER — Ambulatory Visit (INDEPENDENT_AMBULATORY_CARE_PROVIDER_SITE_OTHER)
Admission: RE | Admit: 2015-08-06 | Discharge: 2015-08-06 | Disposition: A | Discharge status: Home / Self Care | Payer: Medicare Other | Source: Ambulatory Visit | Attending: Adult Health | Admitting: Adult Health

## 2015-08-06 ENCOUNTER — Ambulatory Visit (INDEPENDENT_AMBULATORY_CARE_PROVIDER_SITE_OTHER): Payer: Medicare Other | Admitting: Adult Health

## 2015-08-06 VITALS — BP 120/70 | Temp 98.2°F | Ht 68.0 in | Wt 222.7 lb

## 2015-08-06 DIAGNOSIS — Z23 Encounter for immunization: Secondary | ICD-10-CM

## 2015-08-06 DIAGNOSIS — I428 Other cardiomyopathies: Secondary | ICD-10-CM | POA: Diagnosis not present

## 2015-08-06 DIAGNOSIS — M160 Bilateral primary osteoarthritis of hip: Secondary | ICD-10-CM | POA: Diagnosis not present

## 2015-08-06 DIAGNOSIS — M25552 Pain in left hip: Secondary | ICD-10-CM | POA: Diagnosis not present

## 2015-08-06 DIAGNOSIS — I779 Disorder of arteries and arterioles, unspecified: Secondary | ICD-10-CM

## 2015-08-06 DIAGNOSIS — I10 Essential (primary) hypertension: Secondary | ICD-10-CM | POA: Diagnosis not present

## 2015-08-06 DIAGNOSIS — I429 Cardiomyopathy, unspecified: Secondary | ICD-10-CM | POA: Diagnosis not present

## 2015-08-06 DIAGNOSIS — I519 Heart disease, unspecified: Secondary | ICD-10-CM | POA: Diagnosis not present

## 2015-08-06 MED ORDER — METHOCARBAMOL 500 MG PO TABS: 500.0000 mg | ORAL_TABLET | Freq: Two times a day (BID) | ORAL | Status: DC
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
## 2015-08-06 NOTE — Progress Notes (Signed)
Pre visit review using our clinic review tool, if applicable. No additional management support is needed unless otherwise documented below in the visit note. 

## 2015-08-06 NOTE — Patient Instructions (Signed)
It was great meeting you today!  I think that your hip pain is related to a muscle strain. Use the Robaxin to see if that helps with the pain.   I will follow up with you regarding your x rays.   Follow up if no improvement in the next 2-3 days.

## 2015-08-07 LAB — LIPID PANEL
Cholesterol: 144 mg/dL (ref 125–200)
HDL: 23 mg/dL — ABNORMAL LOW (ref >=40)
LDL CALC: 71 mg/dL (ref <130)
TRIGLYCERIDES: 249 mg/dL — AB (ref <150)
Total CHOL/HDL Ratio: 6.3 Ratio — ABNORMAL HIGH (ref <=5.0)
VLDL: 50 mg/dL — ABNORMAL HIGH (ref <30)

## 2015-08-07 LAB — HEPATIC FUNCTION PANEL
ALBUMIN: 4.3 g/dL (ref 3.6–5.1)
ALT: 14 U/L (ref 9–46)
AST: 11 U/L (ref 10–35)
Alkaline Phosphatase: 142 U/L — ABNORMAL HIGH (ref 40–115)
BILIRUBIN INDIRECT: 0.4 mg/dL (ref 0.2–1.2)
Bilirubin, Direct: 0.1 mg/dL (ref <=0.2)
TOTAL PROTEIN: 6.6 g/dL (ref 6.1–8.1)
Total Bilirubin: 0.5 mg/dL (ref 0.2–1.2)

## 2015-08-13 ENCOUNTER — Telehealth: Payer: Self-pay

## 2015-08-13 DIAGNOSIS — E785 Hyperlipidemia, unspecified: Secondary | ICD-10-CM

## 2015-08-13 NOTE — Telephone Encounter (Signed)
-----  Message from [name], MD sent at 08/08/2015  4:21 PM EDT ----- Repeat LFTs 6-8 weeks. Alk Phos elevated. Trig high. Was this a fasting study and does he eat a lot of carbs, sugar etc???

## 2015-08-13 NOTE — Telephone Encounter (Signed)
Pt stated he was fasting before the study but he has not been eating well over the past few weeks. Pt going to come back for repeat blood work on 10/21/2015. Pt aware no questions at this time.

## 2015-08-15 ENCOUNTER — Telehealth: Payer: Self-pay | Admitting: Family Medicine

## 2015-08-15 NOTE — Telephone Encounter (Signed)
No avaliable appts tomorrow 11/11 on your schedule. Pt is refusing to see any other provider. Please advise if you will work pt in tomorrow?

## 2015-08-15 NOTE — Telephone Encounter (Signed)
Let's get in as soon as possible.  If no openings today, what about tomorrow?

## 2015-08-15 NOTE — Telephone Encounter (Signed)
Pt states his blood sugars have been running 200-250 for over a week. Pt states he thinks he may need to go back on insulin shots.  Pt is out of glyBURIDE (DIABETA) 5 MG tablet.  Has called pharm for a week with no response.  But we have not received anything that I see.  Pt has been very tired and weak.  Pt wants to see you today. Pt has appointment next wed, but insist he needs appointment TODAY.  [facility] [address]

## 2015-08-15 NOTE — Telephone Encounter (Signed)
Pt has been sch for tomorrow 11-11 at 930 am

## 2015-08-16 ENCOUNTER — Encounter: Payer: Self-pay | Admitting: Family Medicine

## 2015-08-16 ENCOUNTER — Ambulatory Visit (INDEPENDENT_AMBULATORY_CARE_PROVIDER_SITE_OTHER): Payer: Medicare Other | Admitting: Family Medicine

## 2015-08-16 VITALS — BP 130/82 | HR 88 | Temp 98.2°F | Resp 16 | Ht 68.0 in | Wt 223.9 lb

## 2015-08-16 DIAGNOSIS — E1165 Type 2 diabetes mellitus with hyperglycemia: Secondary | ICD-10-CM | POA: Diagnosis not present

## 2015-08-16 DIAGNOSIS — E1142 Type 2 diabetes mellitus with diabetic polyneuropathy: Secondary | ICD-10-CM | POA: Diagnosis not present

## 2015-08-16 DIAGNOSIS — I779 Disorder of arteries and arterioles, unspecified: Secondary | ICD-10-CM

## 2015-08-16 DIAGNOSIS — IMO0002 Reserved for concepts with insufficient information to code with codable children: Secondary | ICD-10-CM

## 2015-08-16 MED ORDER — GLYBURIDE 5 MG PO TABS: ORAL_TABLET | ORAL | Status: DC
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
## 2015-08-16 NOTE — Progress Notes (Signed)
Pre visit review using our clinic review tool, if applicable. No additional management support is needed unless otherwise documented below in the visit note. 

## 2015-08-20 ENCOUNTER — Other Ambulatory Visit: Payer: Self-pay | Admitting: Cardiovascular Disease

## 2015-08-20 ENCOUNTER — Other Ambulatory Visit: Payer: Self-pay | Admitting: Physical Medicine & Rehabilitation

## 2015-08-21 ENCOUNTER — Ambulatory Visit: Payer: 59 | Admitting: Family Medicine

## 2015-08-23 ENCOUNTER — Other Ambulatory Visit: Payer: Self-pay | Admitting: *Deleted

## 2015-08-23 ENCOUNTER — Ambulatory Visit (INDEPENDENT_AMBULATORY_CARE_PROVIDER_SITE_OTHER): Payer: Medicare Other | Admitting: Cardiovascular Disease

## 2015-08-23 ENCOUNTER — Encounter: Payer: Self-pay | Admitting: Cardiovascular Disease

## 2015-08-23 VITALS — BP 150/80 | HR 69 | Ht 65.0 in | Wt 226.9 lb

## 2015-08-23 DIAGNOSIS — I2510 Atherosclerotic heart disease of native coronary artery without angina pectoris: Secondary | ICD-10-CM | POA: Diagnosis not present

## 2015-08-23 DIAGNOSIS — I10 Essential (primary) hypertension: Secondary | ICD-10-CM

## 2015-08-23 DIAGNOSIS — I255 Ischemic cardiomyopathy: Secondary | ICD-10-CM

## 2015-08-23 DIAGNOSIS — Z79899 Other long term (current) drug therapy: Secondary | ICD-10-CM | POA: Diagnosis not present

## 2015-08-23 DIAGNOSIS — I779 Disorder of arteries and arterioles, unspecified: Secondary | ICD-10-CM | POA: Diagnosis not present

## 2015-08-23 LAB — CBC
HCT: 37.5 % — ABNORMAL LOW (ref 39.0–52.0)
HEMOGLOBIN: 12.8 g/dL — AB (ref 13.0–17.0)
MCH: 27.4 pg (ref 26.0–34.0)
MCHC: 34.1 g/dL (ref 30.0–36.0)
MCV: 80.3 fL (ref 78.0–100.0)
MPV: 9.3 fL (ref 8.6–12.4)
Platelets: 262 10*3/uL (ref 150–400)
RBC: 4.67 MIL/uL (ref 4.22–5.81)
RDW: 13.7 % (ref 11.5–15.5)
WBC: 5.4 10*3/uL (ref 4.0–10.5)

## 2015-08-23 LAB — COMPREHENSIVE METABOLIC PANEL
ALK PHOS: 120 U/L — AB (ref 40–115)
ALT: 16 U/L (ref 9–46)
AST: 13 U/L (ref 10–35)
Albumin: 4.1 g/dL (ref 3.6–5.1)
BUN: 8 mg/dL (ref 7–25)
CALCIUM: 9.2 mg/dL (ref 8.6–10.3)
CHLORIDE: 103 mmol/L (ref 98–110)
CO2: 26 mmol/L (ref 20–31)
Creat: 0.87 mg/dL (ref 0.70–1.25)
Glucose, Bld: 161 mg/dL — ABNORMAL HIGH (ref 65–99)
POTASSIUM: 4.5 mmol/L (ref 3.5–5.3)
Sodium: 138 mmol/L (ref 135–146)
TOTAL PROTEIN: 6.3 g/dL (ref 6.1–8.1)
Total Bilirubin: 0.4 mg/dL (ref 0.2–1.2)

## 2015-08-23 NOTE — Patient Instructions (Signed)
Your physician has recommended that you have a defibrillator inserted [place]. An implantable cardioverter defibrillator (ICD) is a small device that is placed in your chest or, in rare cases, your abdomen. This device uses electrical pulses or shocks to help control life-threatening, irregular heartbeats that could lead the heart to suddenly stop beating (sudden cardiac arrest). Leads are attached to the ICD that goes into your heart. This is done in the hospital and usually requires an overnight stay. Please see the instruction sheet given to you today for more information.  Your physician recommends that you return for lab work in: 3-5 DAYS PRIOR TO THE ICD PROCEDURE.
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
## 2015-08-24 LAB — APTT: aPTT: 30 seconds (ref 24–37)

## 2015-08-24 LAB — PROTIME-INR
INR: 1.09 (ref <1.50)
Prothrombin Time: 14.2 seconds (ref 11.6–15.2)

## 2015-08-27 ENCOUNTER — Encounter (HOSPITAL_COMMUNITY)
Admission: RE | Disposition: A | Discharge status: Home / Self Care | Payer: Medicare Other | Source: Ambulatory Visit | Attending: Cardiovascular Disease

## 2015-08-27 ENCOUNTER — Telehealth: Payer: Self-pay | Admitting: *Deleted

## 2015-08-27 ENCOUNTER — Encounter (HOSPITAL_COMMUNITY): Payer: Self-pay | Admitting: *Deleted

## 2015-08-27 ENCOUNTER — Ambulatory Visit (HOSPITAL_COMMUNITY)
Admission: RE | Admit: 2015-08-27 | Discharge: 2015-08-28 | Disposition: A | Discharge status: Home / Self Care | Payer: BC Managed Care – PPO | Source: Ambulatory Visit | Attending: Cardiovascular Disease | Admitting: Cardiovascular Disease

## 2015-08-27 DIAGNOSIS — Z7901 Long term (current) use of anticoagulants: Secondary | ICD-10-CM | POA: Diagnosis not present

## 2015-08-27 DIAGNOSIS — Z7984 Long term (current) use of oral hypoglycemic drugs: Secondary | ICD-10-CM | POA: Insufficient documentation

## 2015-08-27 DIAGNOSIS — I429 Cardiomyopathy, unspecified: Secondary | ICD-10-CM | POA: Diagnosis not present

## 2015-08-27 DIAGNOSIS — I10 Essential (primary) hypertension: Secondary | ICD-10-CM | POA: Insufficient documentation

## 2015-08-27 DIAGNOSIS — I428 Other cardiomyopathies: Secondary | ICD-10-CM

## 2015-08-27 DIAGNOSIS — I255 Ischemic cardiomyopathy: Secondary | ICD-10-CM

## 2015-08-27 DIAGNOSIS — E1151 Type 2 diabetes mellitus with diabetic peripheral angiopathy without gangrene: Secondary | ICD-10-CM | POA: Insufficient documentation

## 2015-08-27 DIAGNOSIS — Z95810 Presence of automatic (implantable) cardiac defibrillator: Secondary | ICD-10-CM

## 2015-08-27 DIAGNOSIS — G4733 Obstructive sleep apnea (adult) (pediatric): Secondary | ICD-10-CM | POA: Diagnosis not present

## 2015-08-27 DIAGNOSIS — Z89512 Acquired absence of left leg below knee: Secondary | ICD-10-CM | POA: Diagnosis not present

## 2015-08-27 DIAGNOSIS — Z794 Long term (current) use of insulin: Secondary | ICD-10-CM | POA: Insufficient documentation

## 2015-08-27 DIAGNOSIS — E785 Hyperlipidemia, unspecified: Secondary | ICD-10-CM | POA: Diagnosis not present

## 2015-08-27 DIAGNOSIS — I509 Heart failure, unspecified: Secondary | ICD-10-CM | POA: Diagnosis not present

## 2015-08-27 DIAGNOSIS — Z9189 Other specified personal risk factors, not elsewhere classified: Secondary | ICD-10-CM

## 2015-08-27 HISTORY — PX: EP IMPLANTABLE DEVICE: SHX172B

## 2015-08-27 LAB — GLUCOSE, CAPILLARY
GLUCOSE-CAPILLARY: 136 mg/dL — AB (ref 65–99)
GLUCOSE-CAPILLARY: 204 mg/dL — AB (ref 65–99)
Glucose-Capillary: 201 mg/dL — ABNORMAL HIGH (ref 65–99)

## 2015-08-27 LAB — SURGICAL PCR SCREEN
MRSA, PCR: NEGATIVE
Staphylococcus aureus: POSITIVE — AB

## 2015-08-27 SURGERY — ICD IMPLANT

## 2015-08-27 MED ORDER — PANTOPRAZOLE SODIUM 40 MG PO TBEC
40.0000 mg | DELAYED_RELEASE_TABLET | Freq: Every day | ORAL | Status: DC
  Administered 2015-08-27 – 2015-08-28 (×2): 40 mg via ORAL
  Filled 2015-08-27 (×2): qty 1

## 2015-08-27 MED ORDER — CARVEDILOL 12.5 MG PO TABS
12.5000 mg | ORAL_TABLET | Freq: Two times a day (BID) | ORAL | Status: DC
  Administered 2015-08-28: 12.5 mg via ORAL
  Filled 2015-08-27: qty 1

## 2015-08-27 MED ORDER — ONDANSETRON HCL 4 MG/2ML IJ SOLN: 4.0000 mg | Freq: Four times a day (QID) | INTRAMUSCULAR | Status: DC | PRN

## 2015-08-27 MED ORDER — CEFAZOLIN SODIUM-DEXTROSE 2-3 GM-% IV SOLR
INTRAVENOUS | Status: AC
  Filled 2015-08-27: qty 50

## 2015-08-27 MED ORDER — FENOFIBRATE 54 MG PO TABS
54.0000 mg | ORAL_TABLET | Freq: Every day | ORAL | Status: DC
  Administered 2015-08-27 – 2015-08-28 (×2): 54 mg via ORAL
  Filled 2015-08-27 (×2): qty 1

## 2015-08-27 MED ORDER — BUPROPION HCL ER (XL) 150 MG PO TB24
150.0000 mg | ORAL_TABLET | Freq: Every day | ORAL | Status: DC
Start: 2015-08-27 — End: 2015-08-28
  Administered 2015-08-27 – 2015-08-28 (×2): 150 mg via ORAL
  Filled 2015-08-27 (×2): qty 1

## 2015-08-27 MED ORDER — GLYBURIDE 5 MG PO TABS
5.0000 mg | ORAL_TABLET | Freq: Two times a day (BID) | ORAL | Status: DC
  Administered 2015-08-28: 5 mg via ORAL
  Filled 2015-08-27 (×3): qty 1

## 2015-08-27 MED ORDER — MIDAZOLAM HCL 2 MG/2ML IJ SOLN
INTRAMUSCULAR | Status: AC
  Filled 2015-08-27: qty 2

## 2015-08-27 MED ORDER — SODIUM CHLORIDE 0.9 % IV SOLN
INTRAVENOUS | Status: DC
  Administered 2015-08-27: 13:00:00 via INTRAVENOUS

## 2015-08-27 MED ORDER — FENTANYL CITRATE (PF) 100 MCG/2ML IJ SOLN
INTRAMUSCULAR | Status: AC
  Filled 2015-08-27: qty 2

## 2015-08-27 MED ORDER — MUPIROCIN 2 % EX OINT
TOPICAL_OINTMENT | Freq: Two times a day (BID) | CUTANEOUS | Status: DC
  Administered 2015-08-27 – 2015-08-28 (×3): via NASAL
  Filled 2015-08-27: qty 22

## 2015-08-27 MED ORDER — CHLORHEXIDINE GLUCONATE CLOTH 2 % EX PADS: 6.0000 | MEDICATED_PAD | Freq: Every day | CUTANEOUS | Status: DC

## 2015-08-27 MED ORDER — CHLORHEXIDINE GLUCONATE 4 % EX LIQD: 60.0000 mL | Freq: Once | CUTANEOUS | Status: DC

## 2015-08-27 MED ORDER — ACETAMINOPHEN 325 MG PO TABS: 325.0000 mg | ORAL_TABLET | ORAL | Status: DC | PRN

## 2015-08-27 MED ORDER — SODIUM CHLORIDE 0.9 % IJ SOLN: 3.0000 mL | INTRAMUSCULAR | Status: DC | PRN

## 2015-08-27 MED ORDER — LIVING WELL WITH DIABETES BOOK
Freq: Once | Status: AC
  Administered 2015-08-27: 21:00:00
  Filled 2015-08-27: qty 1

## 2015-08-27 MED ORDER — NITROGLYCERIN 0.4 MG SL SUBL: 0.4000 mg | SUBLINGUAL_TABLET | SUBLINGUAL | Status: DC | PRN

## 2015-08-27 MED ORDER — MUPIROCIN 2 % EX OINT
TOPICAL_OINTMENT | CUTANEOUS | Status: AC
  Filled 2015-08-27: qty 22

## 2015-08-27 MED ORDER — FENTANYL CITRATE (PF) 100 MCG/2ML IJ SOLN
INTRAMUSCULAR | Status: DC | PRN
  Administered 2015-08-27 (×3): 25 ug via INTRAVENOUS

## 2015-08-27 MED ORDER — HYDROXYZINE HCL 25 MG PO TABS: 75.0000 mg | ORAL_TABLET | Freq: Every evening | ORAL | Status: DC | PRN

## 2015-08-27 MED ORDER — ATORVASTATIN CALCIUM 20 MG PO TABS
20.0000 mg | ORAL_TABLET | Freq: Every day | ORAL | Status: DC
  Administered 2015-08-27: 20 mg via ORAL
  Filled 2015-08-27: qty 1

## 2015-08-27 MED ORDER — TAMSULOSIN HCL 0.4 MG PO CAPS
0.4000 mg | ORAL_CAPSULE | Freq: Every day | ORAL | Status: DC
  Administered 2015-08-27 – 2015-08-28 (×2): 0.4 mg via ORAL
  Filled 2015-08-27 (×2): qty 1

## 2015-08-27 MED ORDER — LIDOCAINE HCL (PF) 1 % IJ SOLN
INTRAMUSCULAR | Status: AC
  Filled 2015-08-27: qty 60

## 2015-08-27 MED ORDER — CEFAZOLIN SODIUM 1-5 GM-% IV SOLN
1.0000 g | Freq: Four times a day (QID) | INTRAVENOUS | Status: AC
  Administered 2015-08-27 – 2015-08-28 (×3): 1 g via INTRAVENOUS
  Filled 2015-08-27 (×3): qty 50

## 2015-08-27 MED ORDER — IOHEXOL 350 MG/ML SOLN
INTRAVENOUS | Status: DC | PRN
  Administered 2015-08-27: 13 mL via INTRAVENOUS

## 2015-08-27 MED ORDER — CEFAZOLIN SODIUM-DEXTROSE 2-3 GM-% IV SOLR
INTRAVENOUS | Status: DC | PRN
  Administered 2015-08-27: 2 g via INTRAVENOUS

## 2015-08-27 MED ORDER — SERTRALINE HCL 50 MG PO TABS
100.0000 mg | ORAL_TABLET | Freq: Every day | ORAL | Status: DC
  Administered 2015-08-27 – 2015-08-28 (×2): 100 mg via ORAL
  Filled 2015-08-27 (×2): qty 2

## 2015-08-27 MED ORDER — YOU HAVE A PACEMAKER BOOK
Freq: Once | Status: AC
  Administered 2015-08-27: 21:00:00
  Filled 2015-08-27: qty 1

## 2015-08-27 MED ORDER — GABAPENTIN 600 MG PO TABS
600.0000 mg | ORAL_TABLET | Freq: Three times a day (TID) | ORAL | Status: DC
  Administered 2015-08-27 – 2015-08-28 (×2): 600 mg via ORAL
  Filled 2015-08-27 (×2): qty 1

## 2015-08-27 MED ORDER — MIDAZOLAM HCL 5 MG/5ML IJ SOLN
INTRAMUSCULAR | Status: DC | PRN
  Administered 2015-08-27: 2 mg via INTRAVENOUS
  Administered 2015-08-27: 1 mg via INTRAVENOUS
  Administered 2015-08-27: 2 mg via INTRAVENOUS

## 2015-08-27 MED ORDER — METHOCARBAMOL 500 MG PO TABS
500.0000 mg | ORAL_TABLET | Freq: Two times a day (BID) | ORAL | Status: DC
  Administered 2015-08-27 – 2015-08-28 (×2): 500 mg via ORAL
  Filled 2015-08-27 (×2): qty 1

## 2015-08-27 MED ORDER — CEFAZOLIN SODIUM-DEXTROSE 2-3 GM-% IV SOLR: 2.0000 g | INTRAVENOUS | Status: DC

## 2015-08-27 MED ORDER — LISINOPRIL 5 MG PO TABS
2.5000 mg | ORAL_TABLET | Freq: Every day | ORAL | Status: DC
Start: 2015-08-27 — End: 2015-08-28
  Administered 2015-08-27: 22:00:00 2.5 mg via ORAL
  Filled 2015-08-27: qty 1

## 2015-08-27 MED ORDER — OXYCODONE-ACETAMINOPHEN 7.5-325 MG PO TABS
1.0000 | ORAL_TABLET | Freq: Two times a day (BID) | ORAL | Status: DC | PRN
  Administered 2015-08-28 (×2): 1 via ORAL
  Filled 2015-08-27 (×2): qty 1

## 2015-08-27 MED ORDER — SODIUM CHLORIDE 0.9 % IR SOLN
80.0000 mg | Status: AC
  Administered 2015-08-27: 80 mg
  Filled 2015-08-27: qty 2

## 2015-08-27 SURGICAL SUPPLY — 9 items
CABLE SURGICAL S-101-97-12 (CABLE) ×2 IMPLANT
ELECT DEFIB PAD ADLT CADENCE (PAD) ×1 IMPLANT
ICD VISIA MRI VR DVFB1D4 (ICD Generator) IMPLANT
LEAD SPRINT QUAT SEC 6935M-62 (Lead) ×1 IMPLANT
PAD DEFIB LIFELINK (PAD) ×1 IMPLANT
SHEATH CLASSIC 9F (SHEATH) ×2 IMPLANT
TRAY PACEMAKER INSERTION (PACKS) ×1 IMPLANT
TUBING CIL FLEX 10 FLL-RA (TUBING) ×1 IMPLANT
VISIA MRI VR DVFB1D4 (ICD Generator) ×2 IMPLANT

## 2015-08-27 NOTE — H&P (View-Only) (Signed)
ICD Criteria  Current LVEF:30-35%. Within 12 months prior to implant: Yes   Heart failure history: Yes, Class II  Cardiomyopathy history: Yes, Non-Ischemic Cardiomyopathy.  Atrial Fibrillation/Atrial Flutter: No.  Ventricular tachycardia history: No.  Cardiac arrest history: No.  History of syndromes with risk of sudden death: No.  Previous ICD: No.  Current ICD indication: Primary  PPM indication: No.   Class I or II Bradycardia indication present: No  Beta Blocker therapy for 3 or more months: Yes, prescribed.   Ace Inhibitor/ARB therapy for 3 or more months: Yes, prescribed.    

## 2015-08-27 NOTE — Op Note (Signed)
Procedure report  Procedure performed:  1. Implantation of new single chamber cardioverter defibrillator 2. Fluoroscopy 3. Moderate sedation 4. Initial lead and generator testing 5. Left arm venography  Reason for procedure:  Primary prevention of sudden cardiac death Nonischemic cardiomyopathy,  left ventricular ejection fraction less than 35%, Heart failure NYHA class 2, on comprehensive medical therapy for over 90 days (SCD-HeFT)   Procedure performed by: [name], MD  Complications: None  Estimated blood loss: <10 mL  Medications administered during procedure: Ancef 2 g intravenously Lidocaine 1% 30 mL locally,  Fentanyl 75 mcg intravenously Versed 5 mg intravenously Omnipaque 10 mL  Device details: Generator Medtronic Visia AF MRI model DVFB1D4 serial number [id number] Right ventricular lead Medtronic [id number] serial number [id number]  Procedure details:  After the risks and benefits of the procedure were discussed the patient provided informed consent and was brought to the cardiac cath lab in the fasting state. The patient was prepped and draped in usual sterile fashion. Local anesthesia with 1% lidocaine was administered to to the left infraclavicular area. A 5-6 cm horizontal incision was made parallel with and 2-3 cm caudal to the left clavicle. Using electrocautery and blunt dissection a prepectoral pocket was created down to the level of the pectoralis major muscle fascia. The pocket was carefully inspected for hemostasis. An antibiotic-soaked sponge was placed in the pocket.  Under fluoroscopic guidance and using the modified Seldinger technique a venipuncture was performed to access the left subclavian vein. Some difficulty was encountered accessing the vein and a venogram was performed for guidance. Two J-tip guidewires were subsequently exchanged for a 9 French safe sheath.  Under fluoroscopic guidance the ventricular lead was advanced to level of the  mid to apical right ventricular septum and thet active-fixation helix was deployed. Prominent current of injury was seen. Satisfactory pacing and sensing parameters were recorded. There was no evidence of diaphragmatic stimulation at maximum device output. The safe sheath was peeled away and the lead was secured in place with 2-0 silk.  The antibiotic-soaked sponge was removed from the pocket. The pocket was flushed with copious amounts of antibiotic solution. Reinspection showed excellent hemostasis.  The ventricular lead was connected to the generator and appropriate ventricular pacing was seen. Repeat testing of the lead parameters later showed excellent values.  The entire system was then carefully inserted in the pocket with care been taking that the leads and device assumed a comfortable position without pressure on the incision. Great care was taken that the leads be located deep to the generator. The pocket was then closed in layers using 2 layers of 2-0 Vicryl and cutaneous staples, after which a sterile dressing was applied.  Defibrillation threshold testing was then performed. After adequate sedation was achieved, ventricular fibrillation was induced with a 1J shock on T method. There was appropriate sensing by the device. There were no dropouts during "least sensitive" settings. The arrhythmia was terminated by a single 15J shock. High voltage impedance during the shock was 70 ohm. Retesting of the leads confirmed normal function.  At the end of the procedure the following lead parameters were encountered:  Right ventricular lead sensed R waves 6.4 mV, impedance 608ohms, threshold 0.5 V at 0.4 ms pulse width.  High voltage impedance 70, total episode time 9 s  [name], MD, [facility] [phone] office [phone] pager

## 2015-08-27 NOTE — Interval H&P Note (Signed)
History and Physical Interval Note:  08/27/2015 4:04 PM  Eric Price  has presented today for surgery, with the diagnosis of cm  The various methods of treatment have been discussed with the patient and family. After consideration of risks, benefits and other options for treatment, the patient has consented to  Procedure(s): ICD Implant (N/A) as a surgical intervention .  The patient's history has been reviewed, patient examined, no change in status, stable for surgery.  I have reviewed the patient's chart and labs.  Questions were answered to the patient's satisfaction.     Eric Price

## 2015-08-27 NOTE — Progress Notes (Signed)
ICD Criteria  Current LVEF:30-35%. Within 12 months prior to implant: Yes   Heart failure history: Yes, Class II  Cardiomyopathy history: Yes, Non-Ischemic Cardiomyopathy.  Atrial Fibrillation/Atrial Flutter: No.  Ventricular tachycardia history: No.  Cardiac arrest history: No.  History of syndromes with risk of sudden death: No.  Previous ICD: No.  Current ICD indication: Primary  PPM indication: No.   Class I or II Bradycardia indication present: No  Beta Blocker therapy for 3 or more months: Yes, prescribed.   Ace Inhibitor/ARB therapy for 3 or more months: Yes, prescribed.    

## 2015-08-27 NOTE — Telephone Encounter (Signed)
-----   Message from [name], MD sent at 08/23/2015  6:32 PM EST -----  Please make sure he knows to hold his Eliquis  For 48 hours before the ICD procedure. We talked to him about this but did not put it on his AVS

## 2015-08-27 NOTE — Discharge Instructions (Signed)
Supplemental Discharge Instructions for  °Pacemaker/Defibrillator Patients ° °Activity °Do not raise your left/right arm above shoulder level or extend it backward beyond shoulder level for 2 weeks. Wear the arm sling as a reminder or as needed for comfort for 2 weeks. No heavy lifting or vigorous activity with your left/right arm for 6-8 weeks.   ° °NO DRIVING is preferable for 2 weeks; If absolutely necessary, drive only short, familiar routes. DO wear your seatbelt, even if it crosses over the pacemaker site. ° °WOUND CARE °- Keep the wound area clean and dry.  Remove the dressing the day after you return home (usually 48 hours after the procedure). °- DO NOT SUBMERGE UNDER WATER UNTIL FULLY HEALED (no tub baths, hot tubs, swimming pools, etc.).  °- You  may shower or take a sponge bath after the dressing is removed. DO NOT SOAK the area and do not allow the shower to directly spray on the site. °- If you have staples, these will be removed in the office in 7-14 days. °- If you have tape/steri-strips on your wound, these will fall off; do not pull them off prematurely.   °- No bandage is needed on the site.  DO  NOT apply any creams, oils, or ointments to the wound area. °- If you notice any drainage or discharge from the wound, any swelling, excessive redness or bruising at the site, or if you develop a fever > 101? F after you are discharged home, call the office at once. ° °Special Instructions °- You are still able to use cellular telephones.  Avoid carrying your cellular phone near your device. °- When traveling through airports, show security personnel your identification card to avoid being screened in the metal detectors.  °- Avoid arc welding equipment, MRI testing (magnetic resonance imaging), TENS units (transcutaneous nerve stimulators).  Call the office for questions about other devices. °- Avoid electrical appliances that are in poor condition or are not properly grounded. °- Microwave ovens are  safe to be near or to operate. ° °Additional information for defibrillator patients should your device go off: °- If your device goes off ONCE and you feel fine afterward, notify the clinic at [phone]. °- If your device goes off ONCE and you do not feel well afterward, call 911. °- If your device goes off TWICE or more in one day, call 911. ° °DO NOT DRIVE YOURSELF OR A FAMILY MEMBER °WITH A DEFIBRILLATOR TO THE HOSPITAL--CALL 911. ° ° °

## 2015-08-27 NOTE — Telephone Encounter (Signed)
PATIENT HAS HELD THE ELIQUIS X 48 HOURS - VERIFIED

## 2015-08-28 ENCOUNTER — Encounter (HOSPITAL_COMMUNITY): Payer: Self-pay | Admitting: Cardiovascular Disease

## 2015-08-28 ENCOUNTER — Ambulatory Visit (HOSPITAL_COMMUNITY): Payer: BC Managed Care – PPO

## 2015-08-28 DIAGNOSIS — I429 Cardiomyopathy, unspecified: Secondary | ICD-10-CM

## 2015-08-28 DIAGNOSIS — Z7901 Long term (current) use of anticoagulants: Secondary | ICD-10-CM | POA: Diagnosis not present

## 2015-08-28 DIAGNOSIS — E785 Hyperlipidemia, unspecified: Secondary | ICD-10-CM | POA: Diagnosis not present

## 2015-08-28 DIAGNOSIS — Z95810 Presence of automatic (implantable) cardiac defibrillator: Secondary | ICD-10-CM | POA: Diagnosis not present

## 2015-08-28 DIAGNOSIS — Z89512 Acquired absence of left leg below knee: Secondary | ICD-10-CM | POA: Diagnosis not present

## 2015-08-28 LAB — GLUCOSE, CAPILLARY
GLUCOSE-CAPILLARY: 195 mg/dL — AB (ref 65–99)
GLUCOSE-CAPILLARY: 217 mg/dL — AB (ref 65–99)

## 2015-08-28 MED ORDER — APIXABAN 5 MG PO TABS: 5.0000 mg | ORAL_TABLET | Freq: Two times a day (BID) | ORAL | Status: DC

## 2015-08-28 MED FILL — Lidocaine HCl Local Preservative Free (PF) Inj 1%: INTRAMUSCULAR | Qty: 30 | Status: AC

## 2015-08-28 MED FILL — Midazolam HCl Inj 2 MG/2ML (Base Equivalent): INTRAMUSCULAR | Qty: 2 | Status: AC

## 2015-08-28 NOTE — Discharge Summary (Signed)
Physician Discharge Summary   Cardiologist:  Berry/Croitoru  Patient ID: Eric Price MRN: [id number] DOB/AGE: 01-08-1951 64 y.o.  Admit date: 08/27/2015 Discharge date: 08/28/2015  Admission Diagnoses:  Non-ischemic cardiomyopathy: EF ~30-25%,  At risk for sudden cardiac death  Discharge Diagnoses:  Principal Problem:   Non-ischemic cardiomyopathy: EF ~30-25% Active Problems:   At risk for sudden cardiac death   ICD (implantable cardioverter-defibrillator) in place   Discharged Condition: stable  Hospital Course:  Eric Price is a 64 y.o. male who presented fordiscussion of defibrillator implantation, referred by Dr. Quay Burow.  He initially came to attention due to problems with peripheral arterial disease and critical limb ischemia. He has a long-standing history of multiple risk factors for cardiac illness including hypertension, hyperlipidemia and diabetes mellitus. 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 As far as I can tell he has never been diagnosed with atrial fibrillation. He is walking with a prosthesis and a cane.  Coronary angiography performed during his workup in May 2016 showed an isolated 75% stenosis in the distal dominant right coronary artery and an ejection fraction of 25-30%, felt to be out of proportion to the degree of vascular disease. He was placed on high-dose beta blocker therapy and maximum tolerated dose of ACE inhibitor. Repeat echocardiogram performed in October, after roughly 5 months of medical therapy shows marginal improvement in LVEF which is now 30-35%. He has well compensated heart failure, NYHA functional class I-II, primarily limited by his ability to walk fast, rather than any cardiac symptoms.  Additional problems include a history of  obstructive sleep apnea, which he thinks has now resolved with weight loss. He no longer uses CPAP. He bears the diagnosis of mycosis fungoides.  The patient presented for implantation of an ICD.  A single chamber device and was completed successfully.  Follow-up chest x-ray showed no pneumothorax.  Eliquis will be resumed tomorrow. The patient was seen by Dr. Sallyanne Kuster who felt he was stable for DC home.   Device details:  Generator Medtronic Visia AF MRI model I505222 serial number [id number]  Right ventricular lead Medtronic [id number] serial number [id number]   Postop ICD readings R waves 8.3 twice. Impedance 570 th 0.5V at 0.4 MS  Consults: None   Significant Diagnostic Studies:  Procedures    ICD Implant    Conclusion    Procedure performed:  1. Implantation of new single chamber cardioverter defibrillator  2. Fluoroscopy  3. Moderate sedation  4. Initial lead and generator testing  5. Left arm venography  Reason for procedure:  Primary prevention of sudden cardiac death  Nonischemic cardiomyopathy, left ventricular ejection fraction less than 35%, Heart failure NYHA class 2, on comprehensive medical therapy for over 90 days (SCD-HeFT)  Procedure performed by:  [name], MD  Complications:  None  Estimated blood loss:  <10 mL  Medications administered during procedure:  Ancef 2 g intravenously  Lidocaine 1% 30 mL locally,  Fentanyl 75 mcg intravenously  Versed 5 mg intravenously  Omnipaque 10 mL  Device details:  Generator Medtronic Visia AF MRI model [id number] serial number [id number]  Right ventricular lead Medtronic [id number] serial number [id number]  Procedure details:  After the risks and benefits of the procedure were discussed the patient provided informed consent and was brought to the cardiac cath lab in the  fasting state. The patient was prepped and draped in usual sterile fashion. Local anesthesia with 1% lidocaine was  administered to to the left infraclavicular area. A 5-6 cm horizontal incision was made parallel with and 2-3 cm caudal to the left clavicle. Using electrocautery and blunt dissection a prepectoral pocket was created down to the level of the pectoralis major muscle fascia. The pocket was carefully inspected for hemostasis. An antibiotic-soaked sponge was placed in the pocket.  Under fluoroscopic guidance and using the modified Seldinger technique a venipuncture was performed to access the left subclavian vein. Some difficulty was encountered accessing the vein and a venogram was performed for guidance. Two J-tip guidewires were subsequently exchanged for a 9 French safe sheath.  Under fluoroscopic guidance the ventricular lead was advanced to level of the mid to apical right ventricular septum and thet active-fixation helix was deployed. Prominent current of injury was seen. Satisfactory pacing and sensing parameters were recorded. There was no evidence of diaphragmatic stimulation at maximum device output. The safe sheath was peeled away and the lead was secured in place with 2-0 silk.  The antibiotic-soaked sponge was removed from the pocket. The pocket was flushed with copious amounts of antibiotic solution. Reinspection showed excellent hemostasis.  The ventricular lead was connected to the generator and appropriate ventricular pacing was seen. Repeat testing of the lead parameters later showed excellent values.  The entire system was then carefully inserted in the pocket with care been taking that the leads and device assumed a comfortable position without pressure on the incision. Great care was taken that the leads be located deep to the generator. The pocket was then closed in layers using 2 layers of 2-0 Vicryl and cutaneous staples, after which a sterile dressing was applied.  Defibrillation threshold testing was then performed. After adequate sedation was achieved, ventricular fibrillation was  induced with a 1J shock on T method. There was appropriate sensing by the device. There were no dropouts during "least sensitive" settings. The arrhythmia was terminated by a single 15J shock. High voltage impedance during the shock was 70 ohm. Retesting of the leads confirmed normal function.  At the end of the procedure the following lead parameters were encountered:  Right ventricular lead sensed R waves 6.4 mV, impedance 608ohms, threshold 0.5 V at 0.4 ms pulse width.  High voltage impedance 70, total episode time 9 s   [name], MD, [facility]       Treatments: See above  Discharge Exam: Blood pressure 132/80, pulse 91, temperature 97.5 F (36.4 C), temperature source Oral, resp. rate 18, height 5\' 9"  (1.753 m), weight 212 lb 4.9 oz (96.3 kg), SpO2 94 %.   Disposition: 01-Home or Self Care  Discharge Instructions    Call MD for:  redness, tenderness, or signs of infection (pain, swelling, redness, odor or green/yellow discharge around incision site)    Complete by:  As directed      Diet - low sodium heart healthy    Complete by:  As directed      Increase activity slowly    Complete by:  As directed             Medication List    TAKE these medications        apixaban 5 MG Tabs tablet  Commonly known as:  ELIQUIS  Take 1 tablet (5 mg total) by mouth 2 (two) times daily.     atorvastatin 20 MG tablet  Commonly known as:  LIPITOR  TAKE  3 TABLETS(60 MG) BY MOUTH DAILY AT 6 PM     buPROPion 150 MG 24 hr tablet  Commonly known as:  WELLBUTRIN XL  Take 1 tablet (150 mg total) by mouth daily.     carvedilol 25 MG tablet  Commonly known as:  COREG  Take 1 tablet (25 mg total) by mouth 2 (two) times daily.     clobetasol cream 0.05 %  Commonly known as:  TEMOVATE  APPLY 1 APPLICATION TOPICALLY TWICE DAILY     DOCQLACE 100 MG capsule  Generic drug:  docusate sodium  TAKE 1 CAPSULE BY MOUTH DAILY     fenofibrate 54 MG tablet  Take 1 tablet (54 mg total) by  mouth daily. Pt. Not instructed to take currently     gabapentin 600 MG tablet  Commonly known as:  NEURONTIN  TAKE 1 TABLET(600 MG) BY MOUTH FOUR TIMES DAILY     glucose blood test strip  Commonly known as:  ONE TOUCH TEST STRIPS  CHECK 2 TIMES DAILY. ONE TOUCH ULTRA TEST STRIPS. DX:250.00     glyBURIDE 5 MG tablet  Commonly known as:  DIABETA  TAKE 1 TABLET BY MOUTH TWICE DAILY WITH A MEAL     HUMALOG KWIKPEN [place]  Inject 5 units of lipase into the skin daily as needed. For sugar     hydrOXYzine 25 MG tablet  Commonly known as:  ATARAX/VISTARIL  Take 75 mg by mouth at bedtime as needed for itching.     lisinopril 2.5 MG tablet  Commonly known as:  PRINIVIL,ZESTRIL  Take 1 tablet (2.5 mg total) by mouth daily.     metFORMIN 500 MG tablet  Commonly known as:  GLUCOPHAGE  TAKE 2 TABLETS BY MOUTH TWICE DAILY WITH MEALS     methocarbamol 500 MG tablet  Commonly known as:  ROBAXIN  Take 1 tablet (500 mg total) by mouth 2 (two) times daily.     nitroGLYCERIN 0.4 MG SL tablet  Commonly known as:  NITROSTAT  Place 1 tablet (0.4 mg total) under the tongue every 5 (five) minutes as needed for chest pain.     ONE TOUCH LANCETS Misc  Check 2 times daily.     oxyCODONE-acetaminophen 7.5-325 MG tablet  Commonly known as:  PERCOCET  Take 1 tablet by mouth 2 (two) times daily as needed for severe pain.     pantoprazole 40 MG tablet  Commonly known as:  PROTONIX  Take 1 tablet (40 mg total) by mouth daily.     sertraline 100 MG tablet  Commonly known as:  ZOLOFT  Take 1 tablet (100 mg total) by mouth daily.     tamsulosin 0.4 MG Caps capsule  Commonly known as:  FLOMAX  Take 1 capsule (0.4 mg total) by mouth daily.     triamcinolone cream 0.1 %  Commonly known as:  KENALOG  Apply 1 application topically 2 (two) times daily.           Follow-up Information    Follow up with Katiya Fike, PA-C On 09/04/2015.   Specialties:  Physician Assistant, Radiology, Interventional  Cardiology   Why:  12:00 PM   Contact information:   [address] [place] [address] [phone]      Greater than 30 minutes was spent completing the patient's discharge.   SignedTarri Fuller, [place] 08/28/2015, 10:57 AM

## 2015-08-28 NOTE — Progress Notes (Signed)
Patient Name: Eric Price Date of Encounter: 08/28/2015  Principal Problem:   Non-ischemic cardiomyopathy: EF ~30-25% Active Problems:   At risk for sudden cardiac death   ICD (implantable cardioverter-defibrillator) in place   Length of Stay:   SUBJECTIVE  No soreness. Minimal oozing at wound site.  CURRENT MEDS . atorvastatin  20 mg Oral q1800  . buPROPion  150 mg Oral Daily  . carvedilol  12.5 mg Oral BID WC  .  ceFAZolin (ANCEF) IV  1 g Intravenous Q6H  . Chlorhexidine Gluconate Cloth  6 each Topical Daily  . fenofibrate  54 mg Oral Daily  . gabapentin  600 mg Oral TID  . glyBURIDE  5 mg Oral BID WC  . lisinopril  2.5 mg Oral Daily  . methocarbamol  500 mg Oral BID  . mupirocin ointment   Nasal BID  . pantoprazole  40 mg Oral Daily  . sertraline  100 mg Oral Daily  . tamsulosin  0.4 mg Oral Daily    OBJECTIVE   Intake/Output Summary (Last 24 hours) at 08/28/15 0924 Last data filed at 08/28/15 0804  Gross per 24 hour  Intake    720 ml  Output    700 ml  Net     20 ml   Filed Weights   08/27/15 1218 08/27/15 2011 08/28/15 0414  Weight: 224 lb (101.606 kg) 212 lb 4.9 oz (96.3 kg) 212 lb 4.9 oz (96.3 kg)    PHYSICAL EXAM Filed Vitals:   08/27/15 1855 08/27/15 2011 08/28/15 0414 08/28/15 0733  BP: 146/71 157/73 124/88 132/80  Pulse: 61 67 83 91  Temp:  97.9 F (36.6 C) 97.8 F (36.6 C) 97.5 F (36.4 C)  TempSrc:  Oral Oral Oral  Resp: 15 13 19 18   Height:  5\' 9"  (1.753 m)    Weight:  212 lb 4.9 oz (96.3 kg) 212 lb 4.9 oz (96.3 kg)   SpO2: 94% 96% 94% 94%   General: Alert, oriented x3, no distress Head: no evidence of trauma, PERRL, EOMI, no exophtalmos or lid lag, no myxedema, no xanthelasma; normal ears, nose and oropharynx Neck: normal jugular venous pulsations and no hepatojugular reflux; brisk carotid pulses without delay and no carotid bruits Chest: clear to auscultation, no signs of consolidation by percussion or palpation, normal fremitus,  symmetrical and full respiratory excursions; very slight oozing at surgical site. Cardiovascular: normal position and quality of the apical impulse, regular rhythm, normal first and second heart sounds, no rubs or gallops, no murmur Abdomen: no tenderness or distention, no masses by palpation, no abnormal pulsatility or arterial bruits, normal bowel sounds, no hepatosplenomegaly Extremities: no clubbing, cyanosis or edema; 2+ radial, ulnar and brachial pulses bilaterally; 2+ right femoral, posterior tibial and dorsalis pedis pulses; 2+ left femoral, posterior tibial and dorsalis pedis pulses; no subclavian or femoral bruits Neurological: grossly nonfocal   Radiology Studies Imaging results have been reviewed and Dg Chest 2 View  08/28/2015  CLINICAL DATA:  Initial encounter for placement of cardioverter-defibrillator. EXAM: CHEST  2 VIEW COMPARISON:  03/30/2015. FINDINGS: The lungs are clear wiithout focal pneumonia, edema, pneumothorax or pleural effusion. Stable right hilar fullness comparing back to 08/25/2015 and most likely related to vasculature. Cardiopericardial silhouette is at upper limits of normal for size. Imaged bony structures of the thorax are intact. Left-sided single lead pacer/ AICD noted. IMPRESSION: No evidence for pneumothorax status post cardioverter -defibrillator placement. Electronically Signed   By: Misty Stanley M.D.   On: 08/28/2015 08:09  TELE NSR  ECG NSR, PRWP  ASSESSMENT AND PLAN  Uncomplicated single chamber ICD. Shock plan reviewed. Wound care instructions given. Activity restrictions discussed. Wound check 7- 10 days, office device check 4-8 weeks. Resume Eliquis tomorrow.  [name], MD, [facility] [phone] office [phone] pager 08/28/2015 9:24 AM

## 2015-09-02 ENCOUNTER — Other Ambulatory Visit: Payer: Self-pay | Admitting: Family Medicine

## 2015-09-02 DIAGNOSIS — C8409 Mycosis fungoides, extranodal and solid organ sites: Secondary | ICD-10-CM | POA: Diagnosis not present

## 2015-09-02 DIAGNOSIS — C8400 Mycosis fungoides, unspecified site: Secondary | ICD-10-CM | POA: Diagnosis not present

## 2015-09-04 ENCOUNTER — Ambulatory Visit (INDEPENDENT_AMBULATORY_CARE_PROVIDER_SITE_OTHER): Payer: BC Managed Care – PPO | Admitting: Physician Assistant

## 2015-09-04 VITALS — Ht 69.0 in | Wt 224.6 lb

## 2015-09-04 DIAGNOSIS — Z950 Presence of cardiac pacemaker: Secondary | ICD-10-CM

## 2015-09-04 NOTE — Progress Notes (Signed)
Patient ID: Eric Price, male   DOB: 03/15/1951, 64 y.o.   MRN: [id number]    Date:  09/04/2015   ID:  Eric Price, DOB [birth year], MRN [id number]  PCP:  [name], MD  Primary Cardiologist:  Croitoru   Chief complaint:    History of Present Illness: Eric Price is a 64 y.o. male  Eric Price is a 64 y.o. male who saw Dr Sallyanne Kuster fordiscussion of defibrillator implantation, referred by Dr. Quay Burow.  He initially came for evaluation due to problems with peripheral arterial disease and critical limb ischemia. He has a long-standing history of multiple risk factors for cardiac illness including hypertension, hyperlipidemia and diabetes mellitus. 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 Per Dr. Sallyanne Kuster he has never been diagnosed with atrial fibrillation. He is walking with a prosthesis and a cane.  Coronary angiography performed during his workup in May 2016 showed an isolated 75% stenosis in the distal dominant right coronary artery and an ejection fraction of 25-30%, felt to be out of proportion to the degree of vascular disease. He was placed on high-dose beta blocker therapy and maximum tolerated dose of ACE inhibitor. Repeat echocardiogram performed in October, after roughly 5 months of medical therapy shows marginal improvement in LVEF which is now 30-35%. He has well compensated heart failure, NYHA functional class I-II, primarily limited by his ability to walk fast, rather than any cardiac symptoms.  Additional problems include a history of obstructive sleep apnea, which he thinks has now resolved with weight loss. He no longer uses CPAP. He bears the diagnosis of mycosis fungoides.  The patient presented for implantation of an ICD. A single chamber device and was  completed successfully on 08/27/15 . Follow-up chest x-ray showed no pneumothorax.   Patient presented today for a wound check. He reports doing well but is scheduled to have late radiation therapy for skin cancer.  The patient currently denies nausea, vomiting, fever, chest pain, shortness of breath, orthopnea, dizziness, PND, cough, congestion, abdominal pain, hematochezia, melena, lower extremity edema, claudication.  Wt Readings from Last 3 Encounters:  09/04/15 224 lb 9.6 oz (101.878 kg)  08/28/15 212 lb 4.9 oz (96.3 kg)  08/23/15 226 lb 14.4 oz (102.921 kg)     Past Medical History  Diagnosis Date  . Depression   . Hyperlipidemia   . Hypertension   . Critical lower limb ischemia   . Sleep apnea     10-20 yrs. ago, states he used CPAP, not needed anymore.   . Coronary artery disease   . Shortness of breath dyspnea     related to pain currently  . Peripheral vascular disease ([place])   . Pneumonia 2013    hosp. - [facility] x1 week   . Type II diabetes mellitus ([place])   . Arthritis     knees, shoulder, hands   . Mycosis fungoides ([place])     ALK negative; TCR positive; CD30 positive, CD3 positive.   . Nonischemic cardiomyopathy [facility])     Current Outpatient Prescriptions  Medication Sig Dispense Refill  . apixaban (ELIQUIS) 5 MG TABS tablet Take 1 tablet (5 mg total) by mouth 2 (two) times daily. 60 tablet 5  . atorvastatin (LIPITOR) 20 MG tablet TAKE 3 TABLETS(60 MG) BY MOUTH DAILY AT 6 PM 30 tablet 5  .  buPROPion (WELLBUTRIN XL) 150 MG 24 hr tablet Take 1 tablet (150 mg total) by mouth daily. 30 tablet 5  . carvedilol (COREG) 25 MG tablet Take 1 tablet (25 mg total) by mouth 2 (two) times daily. (Patient not taking: Reported on 08/27/2015) 60 tablet 5  . clobetasol cream (TEMOVATE) 5.09 % APPLY 1 APPLICATION TOPICALLY TWICE DAILY 30 g 2  . DOCQLACE 100 MG capsule TAKE 1 CAPSULE BY MOUTH DAILY (Patient taking differently: TAKE 1 CAPSULE BY MOUTH DAILY AS NEEDED) 30 capsule 0  .  fenofibrate 54 MG tablet Take 1 tablet (54 mg total) by mouth daily. Pt. Not instructed to take currently 30 tablet 11  . gabapentin (NEURONTIN) 600 MG tablet TAKE 1 TABLET(600 MG) BY MOUTH FOUR TIMES DAILY 120 tablet 0  . glucose blood (ONE TOUCH TEST STRIPS) test strip CHECK 2 TIMES DAILY. ONE TOUCH ULTRA TEST STRIPS. DX:250.00 100 each 3  . glyBURIDE (DIABETA) 5 MG tablet TAKE 1 TABLET BY MOUTH TWICE DAILY WITH A MEAL 180 tablet 3  . hydrOXYzine (ATARAX/VISTARIL) 25 MG tablet Take 75 mg by mouth at bedtime as needed for itching.    . Insulin Lispro (HUMALOG KWIKPEN [place]) Inject 5 units of lipase into the skin daily as needed. For sugar    . lisinopril (PRINIVIL,ZESTRIL) 2.5 MG tablet Take 1 tablet (2.5 mg total) by mouth daily. 90 tablet 3  . metFORMIN (GLUCOPHAGE) 500 MG tablet TAKE 2 TABLETS BY MOUTH TWICE DAILY WITH MEALS 120 tablet 5  . methocarbamol (ROBAXIN) 500 MG tablet Take 1 tablet (500 mg total) by mouth 2 (two) times daily. 60 tablet 1  . nitroGLYCERIN (NITROSTAT) 0.4 MG SL tablet Place 1 tablet (0.4 mg total) under the tongue every 5 (five) minutes as needed for chest pain. 60 tablet 12  . ONE TOUCH LANCETS MISC Check 2 times daily. 100 each 3  . oxyCODONE-acetaminophen (PERCOCET) 7.5-325 MG per tablet Take 1 tablet by mouth 2 (two) times daily as needed for severe pain. 60 tablet 0  . pantoprazole (PROTONIX) 40 MG tablet Take 1 tablet (40 mg total) by mouth daily. 30 tablet 1  . sertraline (ZOLOFT) 100 MG tablet Take 1 tablet (100 mg total) by mouth daily. 30 tablet 5  . tamsulosin (FLOMAX) 0.4 MG CAPS capsule Take 1 capsule (0.4 mg total) by mouth daily. 30 capsule 5  . triamcinolone cream (KENALOG) 0.1 % Apply 1 application topically 2 (two) times daily.      No current facility-administered medications for this visit.    Allergies:    Allergies  Allergen Reactions  . Morphine Shortness Of Breath and Anaphylaxis  . Morphine And Related     "took my breath away"    Social  History:  The patient  reports that he has never smoked. He has never used smokeless tobacco. He reports that he does not drink alcohol or use illicit drugs.   Family history:   Family History  Problem Relation Age of Onset  . CAD Father 38    Died 71  . Hypertension Father   . Diabetes Maternal Grandmother   . CAD Paternal Grandfather 61  . Heart failure Mother 28  . Colon cancer Neg Hx   . Esophageal cancer Neg Hx   . Stomach cancer Neg Hx   . Rectal cancer Neg Hx     ROS:  Please see the history of present illness.  All other systems reviewed and negative.   PHYSICAL EXAM: VS:  Ht _0  (  1.753 m)  Wt 224 lb 9.6 oz (101.878 kg)  BMI 33.15 kg/m2 Obese, well developed, in no acute distress HEENT: Pupils are equal round react to light accommodation extraocular movements are intact.  Neck: no JVDNo cervical lymphadenopathy. Cardiac: Regular rate and rhythm without murmurs rubs or gallops. Lungs:  clear to auscultation bilaterally, no wheezing, rhonchi or rales Ext: no lower extremity edema.  2+ radial pulses. Skin: warm and dry.  His pacer site is healing well. There is no erythema, discharge, tenderness or edema. Neuro:  Grossly normal    ASSESSMENT AND PLAN:  Permanent pacemaker: Pacer site is healing well. Staples were removed. There is no erythema discharge, edema or tenderness.  We'll arrange follow-up.

## 2015-09-04 NOTE — Patient Instructions (Signed)
We will contact you to schedule an appointment for a device check

## 2015-09-11 DIAGNOSIS — C8400 Mycosis fungoides, unspecified site: Secondary | ICD-10-CM | POA: Diagnosis not present

## 2015-09-11 DIAGNOSIS — C8409 Mycosis fungoides, extranodal and solid organ sites: Secondary | ICD-10-CM | POA: Diagnosis not present

## 2015-09-21 ENCOUNTER — Other Ambulatory Visit: Payer: Self-pay | Admitting: Physical Medicine & Rehabilitation

## 2015-09-23 ENCOUNTER — Telehealth: Payer: Self-pay | Admitting: Cardiovascular Disease

## 2015-09-23 NOTE — Telephone Encounter (Signed)
Pharmacy called regarding Coreg dose. Med dose clarified based on most recent reported strength, advised to have pt call to clarify/confirm dose w/ [place]. No further concerns.

## 2015-10-03 ENCOUNTER — Encounter: Payer: Self-pay | Admitting: Podiatry

## 2015-10-03 ENCOUNTER — Ambulatory Visit (INDEPENDENT_AMBULATORY_CARE_PROVIDER_SITE_OTHER): Payer: BC Managed Care – PPO | Admitting: Podiatry

## 2015-10-03 DIAGNOSIS — B351 Tinea unguium: Secondary | ICD-10-CM | POA: Diagnosis not present

## 2015-10-03 DIAGNOSIS — Q828 Other specified congenital malformations of skin: Secondary | ICD-10-CM | POA: Diagnosis not present

## 2015-10-03 DIAGNOSIS — E0849 Diabetes mellitus due to underlying condition with other diabetic neurological complication: Secondary | ICD-10-CM | POA: Diagnosis not present

## 2015-10-03 DIAGNOSIS — M79604 Pain in right leg: Secondary | ICD-10-CM

## 2015-10-03 DIAGNOSIS — M79676 Pain in unspecified toe(s): Secondary | ICD-10-CM | POA: Diagnosis not present

## 2015-10-03 DIAGNOSIS — I739 Peripheral vascular disease, unspecified: Secondary | ICD-10-CM | POA: Diagnosis not present

## 2015-10-03 DIAGNOSIS — M79605 Pain in left leg: Secondary | ICD-10-CM

## 2015-10-03 NOTE — Progress Notes (Signed)
HPI Presents today chief complaint of painful elongated toenails.  Objective: Pulses are palpable bilateral nails are thick, yellow dystrophic onychomycosis and painful palpation.   Assessment: Onychomycosis with pain in limb.  Plan: Treatment of nails in thickness and length as covered service secondary to pain.  

## 2015-10-03 NOTE — Progress Notes (Signed)
Subjective:     Patient ID: Eric Price, male   DOB: 1951-05-17, 64 y.o.   MRN: [id number]  HPI patient presents thinking me for saving his life due to problems with his left foot which require ultimate amputation but stability at this time. On the right foot he does have moderate vascular disease which is being watch carefully and has keratotic lesion sub-metatarsal and also 1-5 of the right foot   Review of Systems     Objective:   Physical Exam Neurovascular status right is diminished with her ptotic lesion sub-first metatarsal right and thick nails that are incurvated 1 through 5 right with no drainage or redness noted    Assessment:     At risk diabetic with significant vascular disease with mycotic nail infection 1-5 right and lesion right    Plan:     Reviewed all problems debrided nailbeds 1-5 right and lesion right with no iatrogenic bleeding and reappoint for local wound care

## 2015-10-09 DIAGNOSIS — C8400 Mycosis fungoides, unspecified site: Secondary | ICD-10-CM | POA: Diagnosis not present

## 2015-10-16 ENCOUNTER — Ambulatory Visit: Payer: 59 | Admitting: Family Medicine

## 2015-10-16 DIAGNOSIS — C8400 Mycosis fungoides, unspecified site: Secondary | ICD-10-CM | POA: Diagnosis not present

## 2015-10-18 DIAGNOSIS — C8400 Mycosis fungoides, unspecified site: Secondary | ICD-10-CM | POA: Diagnosis not present

## 2015-10-23 DIAGNOSIS — C8400 Mycosis fungoides, unspecified site: Secondary | ICD-10-CM | POA: Diagnosis not present

## 2015-10-24 ENCOUNTER — Ambulatory Visit (INDEPENDENT_AMBULATORY_CARE_PROVIDER_SITE_OTHER): Payer: Medicare Other | Admitting: Family Medicine

## 2015-10-24 ENCOUNTER — Encounter: Payer: Self-pay | Admitting: Family Medicine

## 2015-10-24 VITALS — BP 120/88 | HR 91 | Temp 97.8°F | Ht 69.0 in | Wt 230.2 lb

## 2015-10-24 DIAGNOSIS — M79605 Pain in left leg: Secondary | ICD-10-CM

## 2015-10-24 DIAGNOSIS — Z23 Encounter for immunization: Secondary | ICD-10-CM | POA: Diagnosis not present

## 2015-10-24 DIAGNOSIS — T8789 Other complications of amputation stump: Secondary | ICD-10-CM | POA: Diagnosis not present

## 2015-10-24 DIAGNOSIS — E1159 Type 2 diabetes mellitus with other circulatory complications: Secondary | ICD-10-CM | POA: Diagnosis not present

## 2015-10-24 DIAGNOSIS — Z794 Long term (current) use of insulin: Secondary | ICD-10-CM

## 2015-10-24 DIAGNOSIS — IMO0002 Reserved for concepts with insufficient information to code with codable children: Secondary | ICD-10-CM

## 2015-10-24 DIAGNOSIS — E1165 Type 2 diabetes mellitus with hyperglycemia: Secondary | ICD-10-CM | POA: Diagnosis not present

## 2015-10-24 LAB — POCT GLYCOSYLATED HEMOGLOBIN (HGB A1C): Hemoglobin A1C: 8.7
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
## 2015-10-24 NOTE — Patient Instructions (Signed)
Increase Levemir 2 units every couple of days until fasting sugars are consistently < 130 Start back Humalog 5 units with each meal.

## 2015-10-25 ENCOUNTER — Ambulatory Visit: Payer: Medicare Other | Admitting: Family Medicine

## 2015-10-25 DIAGNOSIS — C8400 Mycosis fungoides, unspecified site: Secondary | ICD-10-CM | POA: Diagnosis not present

## 2015-10-27 ENCOUNTER — Other Ambulatory Visit: Payer: Self-pay | Admitting: Family Medicine

## 2015-10-28 ENCOUNTER — Ambulatory Visit (INDEPENDENT_AMBULATORY_CARE_PROVIDER_SITE_OTHER): Payer: Medicare Other | Admitting: Family Medicine

## 2015-10-28 ENCOUNTER — Encounter: Payer: Self-pay | Admitting: Family Medicine

## 2015-10-28 VITALS — BP 122/80 | HR 82

## 2015-10-28 DIAGNOSIS — Z89512 Acquired absence of left leg below knee: Secondary | ICD-10-CM

## 2015-10-28 DIAGNOSIS — M25561 Pain in right knee: Secondary | ICD-10-CM | POA: Diagnosis not present

## 2015-10-28 DIAGNOSIS — I998 Other disorder of circulatory system: Secondary | ICD-10-CM | POA: Diagnosis not present

## 2015-10-28 DIAGNOSIS — C8400 Mycosis fungoides, unspecified site: Secondary | ICD-10-CM | POA: Diagnosis not present

## 2015-10-28 DIAGNOSIS — I70229 Atherosclerosis of native arteries of extremities with rest pain, unspecified extremity: Secondary | ICD-10-CM

## 2015-10-28 DIAGNOSIS — M1711 Unilateral primary osteoarthritis, right knee: Secondary | ICD-10-CM

## 2015-10-28 NOTE — Assessment & Plan Note (Signed)
Had history on the left side that causes a amputation. I would like to get an ABI on the right side to make sure that nothing is occurring. I do not see any values in his chart at this time. Encourage him to also follow up with his cardiologist.
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
## 2015-10-28 NOTE — Assessment & Plan Note (Signed)
Patient having only one leg at this time. We discussed that the best route would be an injection. Used The amount of steroids secondary to his D.O. diabetes. We discussed viscous supplementation being the safest possibility. We discussed icing regimen. We'll avoid anti-inflammatories due to patient being on chronic blood thinner. I do believe that patient's cancer as well as his history of chemotherapy increases his risk of also having a peripheral vascular disease. ABIs ordered today. Patient and will come back and see me again in 4 weeks and will consider starting viscous supplementation after patient check with insurance company.

## 2015-10-28 NOTE — Patient Instructions (Signed)
Good to see you I am sorry for your tough year but you look great and keep that great smile.  We will order the ABI and they will call you to check the blood flow in the leg.  Ice 20 minutes 2 times daily. Usually after activity and before bed. Keep active See me again in 4 weeks.

## 2015-10-30 ENCOUNTER — Other Ambulatory Visit: Payer: Self-pay | Admitting: Adult Health

## 2015-10-30 ENCOUNTER — Other Ambulatory Visit: Payer: Self-pay | Admitting: Physical Medicine & Rehabilitation

## 2015-10-30 DIAGNOSIS — C8400 Mycosis fungoides, unspecified site: Secondary | ICD-10-CM | POA: Diagnosis not present

## 2015-11-04 DIAGNOSIS — C8400 Mycosis fungoides, unspecified site: Secondary | ICD-10-CM | POA: Diagnosis not present

## 2015-11-05 ENCOUNTER — Telehealth: Payer: Self-pay | Admitting: Family Medicine

## 2015-11-05 NOTE — Telephone Encounter (Signed)
Patient would like to talk to the doctor concerning his medication and the [facility]

## 2015-11-06 ENCOUNTER — Other Ambulatory Visit: Payer: Self-pay | Admitting: Family Medicine

## 2015-11-06 DIAGNOSIS — C8400 Mycosis fungoides, unspecified site: Secondary | ICD-10-CM | POA: Diagnosis not present

## 2015-11-06 DIAGNOSIS — I739 Peripheral vascular disease, unspecified: Secondary | ICD-10-CM

## 2015-11-06 DIAGNOSIS — M25561 Pain in right knee: Secondary | ICD-10-CM

## 2015-11-06 NOTE — Telephone Encounter (Signed)
Made patient an appt for 11/08/15 at 1:15pm.

## 2015-11-06 NOTE — Telephone Encounter (Signed)
Dr. Elease Hashimoto is unable to to directly talk to pt over the phone. He may schedule a doctors appt to discuss. If he does not want to do that please ask him what his concerns are. Thanks.

## 2015-11-08 ENCOUNTER — Ambulatory Visit: Payer: Medicare Other | Admitting: Family Medicine

## 2015-11-08 DIAGNOSIS — C8400 Mycosis fungoides, unspecified site: Secondary | ICD-10-CM | POA: Diagnosis not present

## 2015-11-11 DIAGNOSIS — C8400 Mycosis fungoides, unspecified site: Secondary | ICD-10-CM | POA: Diagnosis not present

## 2015-11-12 ENCOUNTER — Ambulatory Visit (HOSPITAL_COMMUNITY)
Admission: RE | Admit: 2015-11-12 | Discharge: 2015-11-12 | Disposition: A | Discharge status: Home / Self Care | Payer: Medicare Other | Source: Ambulatory Visit | Attending: Family Medicine | Admitting: Family Medicine

## 2015-11-12 DIAGNOSIS — I739 Peripheral vascular disease, unspecified: Secondary | ICD-10-CM | POA: Diagnosis not present

## 2015-11-12 DIAGNOSIS — E119 Type 2 diabetes mellitus without complications: Secondary | ICD-10-CM | POA: Insufficient documentation

## 2015-11-12 DIAGNOSIS — M25561 Pain in right knee: Secondary | ICD-10-CM

## 2015-11-12 DIAGNOSIS — E785 Hyperlipidemia, unspecified: Secondary | ICD-10-CM | POA: Diagnosis not present

## 2015-11-12 DIAGNOSIS — I10 Essential (primary) hypertension: Secondary | ICD-10-CM | POA: Diagnosis not present

## 2015-11-13 DIAGNOSIS — C8400 Mycosis fungoides, unspecified site: Secondary | ICD-10-CM | POA: Diagnosis not present

## 2015-11-14 ENCOUNTER — Encounter: Payer: Self-pay | Admitting: Family Medicine

## 2015-11-14 ENCOUNTER — Ambulatory Visit (INDEPENDENT_AMBULATORY_CARE_PROVIDER_SITE_OTHER): Payer: Medicare Other | Admitting: Family Medicine

## 2015-11-14 VITALS — BP 122/80 | HR 100 | Temp 98.3°F | Ht 69.0 in | Wt 230.6 lb

## 2015-11-14 DIAGNOSIS — E1165 Type 2 diabetes mellitus with hyperglycemia: Secondary | ICD-10-CM

## 2015-11-14 DIAGNOSIS — I10 Essential (primary) hypertension: Secondary | ICD-10-CM | POA: Diagnosis not present

## 2015-11-14 DIAGNOSIS — E1142 Type 2 diabetes mellitus with diabetic polyneuropathy: Secondary | ICD-10-CM

## 2015-11-14 DIAGNOSIS — E785 Hyperlipidemia, unspecified: Secondary | ICD-10-CM

## 2015-11-14 DIAGNOSIS — IMO0002 Reserved for concepts with insufficient information to code with codable children: Secondary | ICD-10-CM

## 2015-11-14 MED ORDER — GLIMEPIRIDE 2 MG PO TABS: 2.0000 mg | ORAL_TABLET | Freq: Every day | ORAL | Status: DC

## 2015-11-14 MED ORDER — INSULIN DETEMIR 100 UNIT/ML ~~LOC~~ SOLN: 32.0000 [IU] | Freq: Every day | SUBCUTANEOUS | Status: DC

## 2015-11-14 MED ORDER — TIZANIDINE HCL 2 MG PO CAPS: 2.0000 mg | ORAL_CAPSULE | Freq: Every evening | ORAL | Status: DC | PRN

## 2015-11-14 MED ORDER — APIXABAN 5 MG PO TABS: 5.0000 mg | ORAL_TABLET | Freq: Two times a day (BID) | ORAL | Status: DC

## 2015-11-14 NOTE — Patient Instructions (Signed)
STOP the Glyburide STOP the Robaxin START the Glimepiride one daily START the Tizanidine at night as needed as muscle relaxer. Check to see if Xarelto is an option to Eliquis as blood thinner.
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
## 2015-11-15 DIAGNOSIS — C8400 Mycosis fungoides, unspecified site: Secondary | ICD-10-CM | POA: Diagnosis not present

## 2015-11-18 ENCOUNTER — Other Ambulatory Visit: Payer: Self-pay | Admitting: *Deleted

## 2015-11-18 MED ORDER — INSULIN DETEMIR 100 UNIT/ML FLEXPEN: 32.0000 [IU] | PEN_INJECTOR | Freq: Every day | SUBCUTANEOUS | Status: DC

## 2015-11-19 DIAGNOSIS — E119 Type 2 diabetes mellitus without complications: Secondary | ICD-10-CM | POA: Diagnosis not present

## 2015-11-19 LAB — HM DIABETES EYE EXAM

## 2015-11-20 DIAGNOSIS — C8400 Mycosis fungoides, unspecified site: Secondary | ICD-10-CM | POA: Diagnosis not present

## 2015-11-21 ENCOUNTER — Other Ambulatory Visit: Payer: Self-pay | Admitting: Family Medicine

## 2015-11-21 MED ORDER — ATORVASTATIN CALCIUM 20 MG PO TABS: 20.0000 mg | ORAL_TABLET | Freq: Every day | ORAL | Status: DC

## 2015-11-22 DIAGNOSIS — C8400 Mycosis fungoides, unspecified site: Secondary | ICD-10-CM | POA: Diagnosis not present

## 2015-11-25 DIAGNOSIS — C8400 Mycosis fungoides, unspecified site: Secondary | ICD-10-CM | POA: Diagnosis not present

## 2015-11-26 ENCOUNTER — Ambulatory Visit (INDEPENDENT_AMBULATORY_CARE_PROVIDER_SITE_OTHER): Payer: Medicare Other | Admitting: Family Medicine

## 2015-11-26 ENCOUNTER — Encounter: Payer: Self-pay | Admitting: Family Medicine

## 2015-11-26 VITALS — BP 130/82 | HR 91 | Ht 69.0 in | Wt 231.0 lb

## 2015-11-26 DIAGNOSIS — M1711 Unilateral primary osteoarthritis, right knee: Secondary | ICD-10-CM | POA: Diagnosis not present

## 2015-11-26 NOTE — Progress Notes (Signed)
Pre visit review using our clinic review tool, if applicable. No additional management support is needed unless otherwise documented below in the visit note. 
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
## 2015-11-26 NOTE — Assessment & Plan Note (Addendum)
Given injection today. We discussed continuing the other medications. Patient did another trial of topical anti-inflammatories. We discussed icing regimen. Patient will be fitted for custom brace secondary to patient only having one leg at think that this would be beneficial. We discussed how this can give better stability. Patient then can follow-up with me again in 2 months. At that time if worsening symptoms we can consider repeating steroid injection. Patient is a very high risk individual for surgery wants to avoid it at all cost.  Spent  25 minutes with patient face-to-face and had greater than 50% of counseling including as described above in assessment and plan.

## 2015-11-26 NOTE — Patient Instructions (Signed)
It is great to see you  We gave you monovisc today and I hope it helps well Ice 20 minutes 2 times daily. Usually after activity and before bed. pennsaid pinkie amount topically 2 times daily as needed.  Stay active and get in that gym See me again in 2 months.

## 2015-11-27 DIAGNOSIS — C8400 Mycosis fungoides, unspecified site: Secondary | ICD-10-CM | POA: Diagnosis not present

## 2015-11-29 DIAGNOSIS — L400 Psoriasis vulgaris: Secondary | ICD-10-CM | POA: Diagnosis not present

## 2015-12-02 DIAGNOSIS — C8400 Mycosis fungoides, unspecified site: Secondary | ICD-10-CM | POA: Diagnosis not present

## 2015-12-04 DIAGNOSIS — C8400 Mycosis fungoides, unspecified site: Secondary | ICD-10-CM | POA: Diagnosis not present

## 2015-12-06 DIAGNOSIS — C8400 Mycosis fungoides, unspecified site: Secondary | ICD-10-CM | POA: Diagnosis not present

## 2015-12-07 ENCOUNTER — Other Ambulatory Visit: Payer: Self-pay | Admitting: Physical Medicine & Rehabilitation

## 2015-12-07 ENCOUNTER — Other Ambulatory Visit: Payer: Self-pay | Admitting: Cardiovascular Disease

## 2015-12-09 DIAGNOSIS — C8400 Mycosis fungoides, unspecified site: Secondary | ICD-10-CM | POA: Diagnosis not present

## 2015-12-09 NOTE — Telephone Encounter (Signed)
Rx(s) sent to pharmacy electronically.  

## 2015-12-11 DIAGNOSIS — C8400 Mycosis fungoides, unspecified site: Secondary | ICD-10-CM | POA: Diagnosis not present

## 2015-12-12 ENCOUNTER — Other Ambulatory Visit: Payer: Self-pay | Admitting: *Deleted

## 2015-12-13 ENCOUNTER — Other Ambulatory Visit: Payer: Self-pay | Admitting: Family Medicine

## 2015-12-13 DIAGNOSIS — C8400 Mycosis fungoides, unspecified site: Secondary | ICD-10-CM | POA: Diagnosis not present

## 2015-12-16 ENCOUNTER — Other Ambulatory Visit: Payer: Self-pay | Admitting: *Deleted

## 2015-12-16 DIAGNOSIS — C8400 Mycosis fungoides, unspecified site: Secondary | ICD-10-CM | POA: Diagnosis not present

## 2015-12-16 DIAGNOSIS — C8409 Mycosis fungoides, extranodal and solid organ sites: Secondary | ICD-10-CM | POA: Diagnosis not present

## 2015-12-16 MED ORDER — BUPROPION HCL ER (XL) 150 MG PO TB24: 150.0000 mg | ORAL_TABLET | Freq: Every day | ORAL | Status: DC

## 2015-12-18 DIAGNOSIS — C8400 Mycosis fungoides, unspecified site: Secondary | ICD-10-CM | POA: Diagnosis not present

## 2015-12-20 DIAGNOSIS — C8400 Mycosis fungoides, unspecified site: Secondary | ICD-10-CM | POA: Diagnosis not present

## 2015-12-23 DIAGNOSIS — E785 Hyperlipidemia, unspecified: Secondary | ICD-10-CM | POA: Diagnosis not present

## 2015-12-23 DIAGNOSIS — M25552 Pain in left hip: Secondary | ICD-10-CM | POA: Insufficient documentation

## 2015-12-23 DIAGNOSIS — Z862 Personal history of diseases of the blood and blood-forming organs and certain disorders involving the immune mechanism: Secondary | ICD-10-CM | POA: Insufficient documentation

## 2015-12-23 DIAGNOSIS — Z79899 Other long term (current) drug therapy: Secondary | ICD-10-CM | POA: Diagnosis not present

## 2015-12-23 DIAGNOSIS — C8400 Mycosis fungoides, unspecified site: Secondary | ICD-10-CM | POA: Diagnosis not present

## 2015-12-23 DIAGNOSIS — E1151 Type 2 diabetes mellitus with diabetic peripheral angiopathy without gangrene: Secondary | ICD-10-CM | POA: Diagnosis not present

## 2015-12-23 DIAGNOSIS — L299 Pruritus, unspecified: Secondary | ICD-10-CM | POA: Diagnosis not present

## 2015-12-25 DIAGNOSIS — C8400 Mycosis fungoides, unspecified site: Secondary | ICD-10-CM | POA: Diagnosis not present

## 2015-12-26 DIAGNOSIS — C8409 Mycosis fungoides, extranodal and solid organ sites: Secondary | ICD-10-CM | POA: Diagnosis not present

## 2015-12-27 DIAGNOSIS — C8409 Mycosis fungoides, extranodal and solid organ sites: Secondary | ICD-10-CM | POA: Diagnosis not present

## 2015-12-30 DIAGNOSIS — C8400 Mycosis fungoides, unspecified site: Secondary | ICD-10-CM | POA: Diagnosis not present

## 2015-12-31 ENCOUNTER — Ambulatory Visit (INDEPENDENT_AMBULATORY_CARE_PROVIDER_SITE_OTHER): Payer: Medicare Other | Admitting: Family Medicine

## 2015-12-31 ENCOUNTER — Encounter: Payer: Self-pay | Admitting: Family Medicine

## 2015-12-31 VITALS — BP 110/70 | HR 90 | Temp 98.5°F | Ht 69.0 in | Wt 231.0 lb

## 2015-12-31 DIAGNOSIS — M545 Low back pain, unspecified: Secondary | ICD-10-CM

## 2015-12-31 DIAGNOSIS — E1165 Type 2 diabetes mellitus with hyperglycemia: Secondary | ICD-10-CM

## 2015-12-31 DIAGNOSIS — E1142 Type 2 diabetes mellitus with diabetic polyneuropathy: Secondary | ICD-10-CM | POA: Diagnosis not present

## 2015-12-31 DIAGNOSIS — IMO0002 Reserved for concepts with insufficient information to code with codable children: Secondary | ICD-10-CM
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
## 2015-12-31 NOTE — Progress Notes (Signed)
Pre visit review using our clinic review tool, if applicable. No additional management support is needed unless otherwise documented below in the visit note. 

## 2015-12-31 NOTE — Patient Instructions (Addendum)
Check blood sugars before each meal and administer Humalog short acting insulin as follows:   Blood sugar readings   70-130- 0 units  131-180-4 units  181-240-8 units  241-300-10 units  301-350-12 units  351-400-16 units -Follow-up with appointment needed with readings in this range.      Your health care provider has decided you need to take insulin regularly. You have been given a correction scale (also called a sliding scale) in case you need extra insulin when your blood sugar is too high (hyperglycemia). The following instructions will assist you in how to use that correction scale.  WHAT IS A CORRECTION SCALE?  When you check your blood sugar, sometimes it will be higher than your health care provider has told you it should be. You may need an extra dose of insulin to bring your blood sugar to the recommended level (also known as your goal, target, or normal level). The correction scale is prescribed by your health care provider based on your specific needs.  Your correction scale has two parts:   The first shows you a blood sugar range.   The second part tells you how much extra insulin to give yourself if your blood sugar falls within this range. You will not need an extra dose of insulin if your blood glucose is in the desired range. You should simply give yourself the normal amount of insulin that your health care provider has ordered for you.  WHY IS IT IMPORTANT TO KEEP YOUR BLOOD SUGAR LEVELS AT YOUR DESIRED LEVEL?  Keeping your blood sugar at the desired level helps to prevent long-term complications of diabetes, such as eye disease, kidney failure, nerve damage, and other serious complications. WHAT TYPE OF INSULIN WILL YOU USE?  To help bring down blood sugar levels that are too high, your health care provider will prescribe a short-acting or a rapid-acting insulin. An example of a short-acting insulin would be regular insulin. Remember, you may also have a  longer-acting insulin prescribed for you.  WHAT DO YOU NEED TO DO?   Check your blood sugar with your home blood glucose meter as recommended by your health care provider.   Using your correction scale, find the range that your blood sugar lies in.   Look for the units of insulin that match that blood sugar range. Give yourself the dose of correction insulin your health care provider has prescribed. Always make sure you are using the right type of insulin.   Prior to the injection, make sure you have food available that you can eat in the next 15-30 minutes.   If your correction insulin is rapid acting, start eating your meal within 15 minutes after you have given yourself the insulin injection. If you wait longer than 15 minutes to eat, your blood sugar might get too low.   If your correction insulin is short acting(regular), start eating your meal within 30 minutes after you have given yourself the insulin injection. If you wait longer than 30 minutes to eat, your blood sugar might get too low. Symptoms of low blood sugar (hypoglycemia) may include feeling shaky or weak, sweating, feeling confused, difficulty seeing, agitation, crankiness, or numbness of the lips or tongue. Check your blood sugar immediately and treat your results as directed by your health care provider.   Keep a log of your blood sugar results with the time you took the test and the amount of insulin that you injected. This information will help your health care  provider manage your medicines.   Note on your log anything that may affect your blood sugar level, such as:   Changes in normal exercise or activity.   Changes in your normal schedule, such as staying up late, going on vacation, changing your diet, or holidays.   New medicines. This includes prescription and over-the-counter medicines. Some medicines may cause high blood sugar.   Sickness, stress, or anxiety.   Changes in the time you took your  medicine.   Changes in your meals, such as skipping a meal, having a late meal, or dining out.   Eating things that may affect blood glucose, such as snacks, meal portions that are larger than normal, drinks with sugar, or eating less than usual.   Ask your health care provider any questions you have.  Be aware of "stacking" your insulin doses. This happens when you correct a high blood sugar level by giving yourself extra insulin too soon after a previous correction dose or mealtime dose. You may then have too much insulin still active in your body and may be at risk for hypoglycemia. WHY DO YOU NEED A CORRECTION SCALE IF YOU HAVE NEVER BEEN DIAGNOSED WITH DIABETES?   Keeping your blood glucose in the target range is important for your overall health.   You may have been prescribed medicines that cause your blood glucose to be higher than normal. WHEN SHOULD YOU SEEK MEDICAL CARE? Contact your health care provider if:   You have experienced hypoglycemia that you are unable to treat with your usual routine.   You have a high blood sugar level that is not coming down with the correction dose.  Your blood sugar is often too low or does not come up even if you eat a fast-acting carbohydrate. Someone who lives with you should seek immediate medical care if you become unresponsive.   This information is not intended to replace advice given to you by your health care provider. Make sure you discuss any questions you have with your health care provider.   Document Released: 02/12/2011 Document Revised: 05/24/2013 Document Reviewed: 03/03/2013 Elsevier Interactive Patient Education [employer].

## 2016-01-01 DIAGNOSIS — C8400 Mycosis fungoides, unspecified site: Secondary | ICD-10-CM | POA: Diagnosis not present

## 2016-01-02 ENCOUNTER — Encounter: Payer: Self-pay | Admitting: Podiatry

## 2016-01-02 ENCOUNTER — Ambulatory Visit (INDEPENDENT_AMBULATORY_CARE_PROVIDER_SITE_OTHER): Payer: Medicare Other | Admitting: Podiatry

## 2016-01-02 DIAGNOSIS — E0849 Diabetes mellitus due to underlying condition with other diabetic neurological complication: Secondary | ICD-10-CM

## 2016-01-02 DIAGNOSIS — B351 Tinea unguium: Secondary | ICD-10-CM

## 2016-01-02 DIAGNOSIS — M79671 Pain in right foot: Secondary | ICD-10-CM | POA: Diagnosis not present

## 2016-01-02 DIAGNOSIS — M79604 Pain in right leg: Secondary | ICD-10-CM

## 2016-01-02 DIAGNOSIS — M79672 Pain in left foot: Secondary | ICD-10-CM | POA: Diagnosis not present

## 2016-01-02 DIAGNOSIS — M79605 Pain in left leg: Secondary | ICD-10-CM

## 2016-01-02 DIAGNOSIS — Q828 Other specified congenital malformations of skin: Secondary | ICD-10-CM

## 2016-01-02 DIAGNOSIS — I739 Peripheral vascular disease, unspecified: Secondary | ICD-10-CM

## 2016-01-03 DIAGNOSIS — C8400 Mycosis fungoides, unspecified site: Secondary | ICD-10-CM | POA: Diagnosis not present

## 2016-01-03 NOTE — Progress Notes (Signed)
Subjective:     Patient ID: Eric Price, male   DOB: August 16, 1951, 65 y.o.   MRN: [id number]  HPI at risk diabetic with left leg it was amputated but able to wear shoe who has nail disease right keratotic lesions and vascular disease right   Review of Systems     Objective:   Physical Exam Neurovascular status diminished but unchanged from previous visit with patient found to have incurvated nailbeds 1-5 right foot that is painful and lesion on the right hallux that is pre-ulcerative in its presentation    Assessment:     At risk diabetic with ingrown mycotic nail infection right one through 5 and lesion formation hallux right    Plan:     Reviewed condition and he does want diabetic shoes which I think we'll be in his best interest. We will get approval for these due to his loss of the left leg and is pre-ulcerative condition right and today I debrided nailbeds 1-5 right and lesion on the right with no iatrogenic bleeding noted

## 2016-01-06 DIAGNOSIS — C8400 Mycosis fungoides, unspecified site: Secondary | ICD-10-CM | POA: Diagnosis not present

## 2016-01-08 DIAGNOSIS — C8400 Mycosis fungoides, unspecified site: Secondary | ICD-10-CM | POA: Diagnosis not present

## 2016-01-09 ENCOUNTER — Ambulatory Visit: Payer: Medicare Other | Attending: Family Medicine | Admitting: Physical Therapy

## 2016-01-09 DIAGNOSIS — M545 Low back pain, unspecified: Secondary | ICD-10-CM

## 2016-01-09 DIAGNOSIS — M25652 Stiffness of left hip, not elsewhere classified: Secondary | ICD-10-CM | POA: Diagnosis not present

## 2016-01-09 DIAGNOSIS — M6281 Muscle weakness (generalized): Secondary | ICD-10-CM | POA: Diagnosis not present

## 2016-01-09 DIAGNOSIS — R262 Difficulty in walking, not elsewhere classified: Secondary | ICD-10-CM | POA: Diagnosis not present

## 2016-01-09 DIAGNOSIS — M25552 Pain in left hip: Secondary | ICD-10-CM

## 2016-01-09 NOTE — Patient Instructions (Signed)
Abdominal brace in sitting:  Exhale while moving arms   Use lumbar roll when sitting
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
## 2016-01-10 DIAGNOSIS — C8400 Mycosis fungoides, unspecified site: Secondary | ICD-10-CM | POA: Diagnosis not present

## 2016-01-15 DIAGNOSIS — C8400 Mycosis fungoides, unspecified site: Secondary | ICD-10-CM | POA: Diagnosis not present

## 2016-01-20 DIAGNOSIS — C8400 Mycosis fungoides, unspecified site: Secondary | ICD-10-CM | POA: Diagnosis not present

## 2016-01-21 ENCOUNTER — Ambulatory Visit: Payer: Medicare Other | Admitting: Family Medicine

## 2016-01-21 ENCOUNTER — Ambulatory Visit: Payer: Medicare Other

## 2016-01-21 ENCOUNTER — Encounter: Payer: Self-pay | Admitting: Family Medicine

## 2016-01-21 ENCOUNTER — Ambulatory Visit (INDEPENDENT_AMBULATORY_CARE_PROVIDER_SITE_OTHER): Payer: Medicare Other | Admitting: Family Medicine

## 2016-01-21 ENCOUNTER — Other Ambulatory Visit: Payer: Self-pay | Admitting: Family Medicine

## 2016-01-21 VITALS — BP 120/84 | HR 89 | Ht 69.0 in | Wt 229.0 lb

## 2016-01-21 DIAGNOSIS — M6281 Muscle weakness (generalized): Secondary | ICD-10-CM

## 2016-01-21 DIAGNOSIS — M1711 Unilateral primary osteoarthritis, right knee: Secondary | ICD-10-CM | POA: Diagnosis not present

## 2016-01-21 DIAGNOSIS — Z89512 Acquired absence of left leg below knee: Secondary | ICD-10-CM

## 2016-01-21 DIAGNOSIS — M533 Sacrococcygeal disorders, not elsewhere classified: Secondary | ICD-10-CM

## 2016-01-21 DIAGNOSIS — M25552 Pain in left hip: Secondary | ICD-10-CM

## 2016-01-21 DIAGNOSIS — IMO0002 Reserved for concepts with insufficient information to code with codable children: Secondary | ICD-10-CM

## 2016-01-21 DIAGNOSIS — E1159 Type 2 diabetes mellitus with other circulatory complications: Secondary | ICD-10-CM

## 2016-01-21 DIAGNOSIS — Z794 Long term (current) use of insulin: Secondary | ICD-10-CM

## 2016-01-21 DIAGNOSIS — M545 Low back pain, unspecified: Secondary | ICD-10-CM

## 2016-01-21 DIAGNOSIS — E1165 Type 2 diabetes mellitus with hyperglycemia: Secondary | ICD-10-CM

## 2016-01-21 DIAGNOSIS — R262 Difficulty in walking, not elsewhere classified: Secondary | ICD-10-CM

## 2016-01-21 DIAGNOSIS — M25652 Stiffness of left hip, not elsewhere classified: Secondary | ICD-10-CM

## 2016-01-21 NOTE — Assessment & Plan Note (Signed)
Monitor blood sugars closely for the next 3 days injury to the injection

## 2016-01-21 NOTE — Assessment & Plan Note (Signed)
I do believe that patient's antalgic gait secondary to the instability of this prosthesis could be contribute in. Patient will have this readjusted. Can be giving him some of his lower back pain.
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
## 2016-01-21 NOTE — Progress Notes (Signed)
Pre visit review using our clinic review tool, if applicable. No additional management support is needed unless otherwise documented below in the visit note. 

## 2016-01-21 NOTE — Patient Instructions (Signed)
HIP: Hamstrings - Short Sitting    Rest heel on the floor. Keep knee straight. Lift chest. Hold _20__ seconds. _3__ reps per set, _3__ sets per day  Copyright  VHI. All rights reserved.  Piriformis Stretch - Supine    Pull uninvolved knee across body to opposite shoulder. Hold slight stretch for _20__ seconds. Repeat with involved leg. Repeat _3__ times. Do _3__ times per day.  Copyright  VHI. All rights reserved.  Knee to Chest (Flexion)    Pull knee toward chest. Feel stretch in lower back or buttock area. Breathing deeply, Hold 20___ seconds. Repeat with other knee. Repeat __3__ times. Do _3___ sessions per day.  http://gt2.exer.us/225   Copyright  VHI. All rights reserved.   Piriformis Stretch, Sitting    Sit, one ankle on opposite knee, same-side hand on crossed knee. Push down on knee, keeping spine straight. Lean torso forward, with flat back, until tension is felt in hamstrings and gluteals of crossed-leg side. Hold _20__ seconds.  Repeat _3__ times per session. Do _3__ sessions per day.  Copyright  VHI. All rights reserved.  [facility] [address], [place], [place] [address] Phone # [phone] Fax [phone]

## 2016-01-21 NOTE — Assessment & Plan Note (Signed)
Discussed with patient no significant improvement and I would consider injection. Patient will finish with formal physical therapy and then if worsening symptoms consider reinjection.

## 2016-01-21 NOTE — Patient Instructions (Addendum)
Good to see you  Lets let PT keep working on you nd tell them a little more of the SI joint.  I think if you get the prosthesis fixed it could help your back  Injected the knee again today to give you some relief Watch blood sugars closely next 3 days See me again at end of the series of PT if not better we can inject SI joint.
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
## 2016-01-21 NOTE — Assessment & Plan Note (Signed)
Patient given another injection today. At this point patient has many other medical problems and would not be a candidate for any type of surgical intervention. I was hoping that patient would have improvement with the viscous supplementation longer than we had. Can repeat in 4 months and would do a trial of 4 injections otherwise. Encourage patient to continue to monitor his blood sugars. Discussed staying active. Possibly using brace for instability. Patient will follow-up again in 4-6 weeks to see how he is responding. We like to try to limit the injections every 10 weeks maximum.

## 2016-01-22 DIAGNOSIS — C8400 Mycosis fungoides, unspecified site: Secondary | ICD-10-CM | POA: Diagnosis not present

## 2016-01-23 DIAGNOSIS — E785 Hyperlipidemia, unspecified: Secondary | ICD-10-CM | POA: Diagnosis not present

## 2016-01-23 DIAGNOSIS — Z7189 Other specified counseling: Secondary | ICD-10-CM | POA: Diagnosis not present

## 2016-01-23 DIAGNOSIS — Z794 Long term (current) use of insulin: Secondary | ICD-10-CM | POA: Diagnosis not present

## 2016-01-23 DIAGNOSIS — E1165 Type 2 diabetes mellitus with hyperglycemia: Secondary | ICD-10-CM | POA: Diagnosis not present

## 2016-01-23 DIAGNOSIS — E1142 Type 2 diabetes mellitus with diabetic polyneuropathy: Secondary | ICD-10-CM | POA: Diagnosis not present

## 2016-01-24 DIAGNOSIS — C8400 Mycosis fungoides, unspecified site: Secondary | ICD-10-CM | POA: Diagnosis not present

## 2016-01-27 ENCOUNTER — Ambulatory Visit (INDEPENDENT_AMBULATORY_CARE_PROVIDER_SITE_OTHER): Payer: Medicare Other | Admitting: Family Medicine

## 2016-01-27 ENCOUNTER — Other Ambulatory Visit: Payer: Self-pay | Admitting: Family Medicine

## 2016-01-27 ENCOUNTER — Encounter: Payer: Self-pay | Admitting: Family Medicine

## 2016-01-27 VITALS — BP 130/100 | HR 94 | Temp 98.3°F | Ht 69.0 in | Wt 228.0 lb

## 2016-01-27 DIAGNOSIS — I10 Essential (primary) hypertension: Secondary | ICD-10-CM

## 2016-01-27 DIAGNOSIS — E1142 Type 2 diabetes mellitus with diabetic polyneuropathy: Secondary | ICD-10-CM

## 2016-01-27 DIAGNOSIS — C8400 Mycosis fungoides, unspecified site: Secondary | ICD-10-CM | POA: Diagnosis not present

## 2016-01-27 DIAGNOSIS — IMO0002 Reserved for concepts with insufficient information to code with codable children: Secondary | ICD-10-CM

## 2016-01-27 DIAGNOSIS — F332 Major depressive disorder, recurrent severe without psychotic features: Secondary | ICD-10-CM | POA: Diagnosis not present

## 2016-01-27 DIAGNOSIS — R0789 Other chest pain: Secondary | ICD-10-CM

## 2016-01-27 DIAGNOSIS — E1165 Type 2 diabetes mellitus with hyperglycemia: Secondary | ICD-10-CM

## 2016-01-27 NOTE — Progress Notes (Signed)
Pre visit review using our clinic review tool, if applicable. No additional management support is needed unless otherwise documented below in the visit note. 

## 2016-01-27 NOTE — Patient Instructions (Signed)
Try over the counter Tylenol for chest wall pain. Avoid Advil or Aleve while on the Eliquis We can get X-ray of ribs if not improving soon.
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
## 2016-01-28 ENCOUNTER — Ambulatory Visit: Payer: Medicare Other | Admitting: Physical Therapy

## 2016-01-28 ENCOUNTER — Encounter: Payer: Self-pay | Admitting: Physical Therapy

## 2016-01-28 DIAGNOSIS — M545 Low back pain, unspecified: Secondary | ICD-10-CM

## 2016-01-28 DIAGNOSIS — M25552 Pain in left hip: Secondary | ICD-10-CM | POA: Diagnosis not present

## 2016-01-28 DIAGNOSIS — M6281 Muscle weakness (generalized): Secondary | ICD-10-CM

## 2016-01-28 DIAGNOSIS — R262 Difficulty in walking, not elsewhere classified: Secondary | ICD-10-CM | POA: Diagnosis not present

## 2016-01-28 DIAGNOSIS — M25652 Stiffness of left hip, not elsewhere classified: Secondary | ICD-10-CM
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
## 2016-01-29 DIAGNOSIS — C8400 Mycosis fungoides, unspecified site: Secondary | ICD-10-CM | POA: Diagnosis not present

## 2016-01-30 ENCOUNTER — Ambulatory Visit: Payer: Medicare Other | Admitting: Physical Therapy

## 2016-01-30 DIAGNOSIS — M25552 Pain in left hip: Secondary | ICD-10-CM

## 2016-01-30 DIAGNOSIS — M6281 Muscle weakness (generalized): Secondary | ICD-10-CM | POA: Diagnosis not present

## 2016-01-30 DIAGNOSIS — R262 Difficulty in walking, not elsewhere classified: Secondary | ICD-10-CM | POA: Diagnosis not present

## 2016-01-30 DIAGNOSIS — M545 Low back pain, unspecified: Secondary | ICD-10-CM

## 2016-01-30 DIAGNOSIS — M25652 Stiffness of left hip, not elsewhere classified: Secondary | ICD-10-CM
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
## 2016-01-30 NOTE — Patient Instructions (Addendum)
   Go easy since ribs are sore.     Trigger Point Dry Needling  . What is Trigger Point Dry Needling (DN)? o DN is a physical therapy technique used to treat muscle pain and dysfunction. Specifically, DN helps deactivate muscle trigger points (muscle knots).  o A thin filiform needle is used to penetrate the skin and stimulate the underlying trigger point. The goal is for a local twitch response (LTR) to occur and for the trigger point to relax. No medication of any kind is injected during the procedure.   . What Does Trigger Point Dry Needling Feel Like?  o The procedure feels different for each individual patient. Some patients report that they do not actually feel the needle enter the skin and overall the process is not painful. Very mild bleeding may occur. However, many patients feel a deep cramping in the muscle in which the needle was inserted. This is the local twitch response.   Marland Kitchen How Will I feel after the treatment? o Soreness is normal, and the onset of soreness may not occur for a few hours. Typically this soreness does not last longer than two days.  o Bruising is uncommon, however; ice can be used to decrease any possible bruising.  o In rare cases feeling tired or nauseous after the treatment is normal. In addition, your symptoms may get worse before they get better, this period will typically not last longer than 24 hours.   . What Can I do After My Treatment? o Increase your hydration by drinking more water for the next 24 hours. o You may place ice or heat on the areas treated that have become sore, however, do not use heat on inflamed or bruised areas. Heat often brings more relief post needling. o You can continue your regular activities, but vigorous activity is not recommended initially after the treatment for 24 hours. o DN is best combined with other physical therapy such as strengthening, stretching, and other therapies.   Ruben Im PT [facility] [address], [place], [place] [address] Phone # [phone] Fax [phone]

## 2016-01-31 ENCOUNTER — Ambulatory Visit: Payer: Medicare Other | Admitting: Family Medicine

## 2016-01-31 DIAGNOSIS — C8400 Mycosis fungoides, unspecified site: Secondary | ICD-10-CM | POA: Diagnosis not present

## 2016-02-03 DIAGNOSIS — C8400 Mycosis fungoides, unspecified site: Secondary | ICD-10-CM | POA: Diagnosis not present

## 2016-02-04 ENCOUNTER — Ambulatory Visit: Payer: Medicare Other | Attending: Family Medicine

## 2016-02-04 DIAGNOSIS — R262 Difficulty in walking, not elsewhere classified: Secondary | ICD-10-CM | POA: Insufficient documentation

## 2016-02-04 DIAGNOSIS — M25552 Pain in left hip: Secondary | ICD-10-CM | POA: Insufficient documentation

## 2016-02-04 DIAGNOSIS — M545 Low back pain, unspecified: Secondary | ICD-10-CM

## 2016-02-04 DIAGNOSIS — M25652 Stiffness of left hip, not elsewhere classified: Secondary | ICD-10-CM | POA: Insufficient documentation

## 2016-02-04 DIAGNOSIS — M6281 Muscle weakness (generalized): Secondary | ICD-10-CM | POA: Diagnosis not present
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
## 2016-02-05 DIAGNOSIS — C8400 Mycosis fungoides, unspecified site: Secondary | ICD-10-CM | POA: Diagnosis not present

## 2016-02-06 ENCOUNTER — Encounter: Payer: Self-pay | Admitting: Physical Therapy

## 2016-02-06 ENCOUNTER — Ambulatory Visit: Payer: Medicare Other | Admitting: Physical Therapy

## 2016-02-06 DIAGNOSIS — M6281 Muscle weakness (generalized): Secondary | ICD-10-CM | POA: Diagnosis not present

## 2016-02-06 DIAGNOSIS — M25652 Stiffness of left hip, not elsewhere classified: Secondary | ICD-10-CM

## 2016-02-06 DIAGNOSIS — M545 Low back pain, unspecified: Secondary | ICD-10-CM

## 2016-02-06 DIAGNOSIS — M25552 Pain in left hip: Secondary | ICD-10-CM

## 2016-02-06 DIAGNOSIS — R262 Difficulty in walking, not elsewhere classified: Secondary | ICD-10-CM | POA: Diagnosis not present
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
## 2016-02-07 DIAGNOSIS — C8400 Mycosis fungoides, unspecified site: Secondary | ICD-10-CM | POA: Diagnosis not present

## 2016-02-10 DIAGNOSIS — C8400 Mycosis fungoides, unspecified site: Secondary | ICD-10-CM | POA: Diagnosis not present

## 2016-02-11 ENCOUNTER — Ambulatory Visit: Payer: Medicare Other

## 2016-02-11 DIAGNOSIS — M545 Low back pain, unspecified: Secondary | ICD-10-CM

## 2016-02-11 DIAGNOSIS — M6281 Muscle weakness (generalized): Secondary | ICD-10-CM | POA: Diagnosis not present

## 2016-02-11 DIAGNOSIS — M25552 Pain in left hip: Secondary | ICD-10-CM

## 2016-02-11 DIAGNOSIS — M25652 Stiffness of left hip, not elsewhere classified: Secondary | ICD-10-CM | POA: Diagnosis not present

## 2016-02-11 DIAGNOSIS — R262 Difficulty in walking, not elsewhere classified: Secondary | ICD-10-CM | POA: Diagnosis not present
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
## 2016-02-12 DIAGNOSIS — C8400 Mycosis fungoides, unspecified site: Secondary | ICD-10-CM | POA: Diagnosis not present

## 2016-02-13 ENCOUNTER — Ambulatory Visit: Payer: Medicare Other

## 2016-02-14 DIAGNOSIS — C8400 Mycosis fungoides, unspecified site: Secondary | ICD-10-CM | POA: Diagnosis not present

## 2016-02-17 DIAGNOSIS — C8400 Mycosis fungoides, unspecified site: Secondary | ICD-10-CM | POA: Diagnosis not present

## 2016-02-18 ENCOUNTER — Encounter: Payer: Self-pay | Admitting: Family Medicine

## 2016-02-18 ENCOUNTER — Ambulatory Visit (INDEPENDENT_AMBULATORY_CARE_PROVIDER_SITE_OTHER): Payer: Medicare Other | Admitting: Family Medicine

## 2016-02-18 ENCOUNTER — Ambulatory Visit: Payer: Medicare Other | Admitting: Physical Therapy

## 2016-02-18 VITALS — BP 140/90 | HR 125 | Temp 98.2°F | Ht 69.0 in | Wt 226.0 lb

## 2016-02-18 DIAGNOSIS — I10 Essential (primary) hypertension: Secondary | ICD-10-CM

## 2016-02-18 DIAGNOSIS — J209 Acute bronchitis, unspecified: Secondary | ICD-10-CM | POA: Diagnosis not present

## 2016-02-18 DIAGNOSIS — E1165 Type 2 diabetes mellitus with hyperglycemia: Secondary | ICD-10-CM

## 2016-02-18 DIAGNOSIS — IMO0002 Reserved for concepts with insufficient information to code with codable children: Secondary | ICD-10-CM

## 2016-02-18 DIAGNOSIS — E1142 Type 2 diabetes mellitus with diabetic polyneuropathy: Secondary | ICD-10-CM | POA: Diagnosis not present

## 2016-02-18 MED ORDER — DOXYCYCLINE HYCLATE 100 MG PO CAPS: 100.0000 mg | ORAL_CAPSULE | Freq: Two times a day (BID) | ORAL | Status: DC

## 2016-02-18 NOTE — Progress Notes (Signed)
Pre visit review using our clinic review tool, if applicable. No additional management support is needed unless otherwise documented below in the visit note. 

## 2016-02-18 NOTE — Patient Instructions (Signed)
Bring in home sugar readings at follow.
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
## 2016-02-19 DIAGNOSIS — C8400 Mycosis fungoides, unspecified site: Secondary | ICD-10-CM | POA: Diagnosis not present

## 2016-02-20 ENCOUNTER — Other Ambulatory Visit: Payer: Self-pay | Admitting: Family Medicine

## 2016-02-20 ENCOUNTER — Ambulatory Visit: Payer: Medicare Other | Admitting: Physical Therapy

## 2016-02-20 DIAGNOSIS — M25552 Pain in left hip: Secondary | ICD-10-CM | POA: Diagnosis not present

## 2016-02-20 DIAGNOSIS — R262 Difficulty in walking, not elsewhere classified: Secondary | ICD-10-CM | POA: Diagnosis not present

## 2016-02-20 DIAGNOSIS — M25652 Stiffness of left hip, not elsewhere classified: Secondary | ICD-10-CM | POA: Diagnosis not present

## 2016-02-20 DIAGNOSIS — M545 Low back pain, unspecified: Secondary | ICD-10-CM

## 2016-02-20 DIAGNOSIS — M6281 Muscle weakness (generalized): Secondary | ICD-10-CM
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
## 2016-02-21 DIAGNOSIS — C8400 Mycosis fungoides, unspecified site: Secondary | ICD-10-CM | POA: Diagnosis not present

## 2016-02-24 ENCOUNTER — Encounter: Payer: Self-pay | Admitting: Family Medicine

## 2016-02-24 ENCOUNTER — Ambulatory Visit (INDEPENDENT_AMBULATORY_CARE_PROVIDER_SITE_OTHER): Payer: Medicare Other | Admitting: Family Medicine

## 2016-02-24 VITALS — BP 130/98 | HR 101 | Temp 97.9°F | Ht 69.0 in | Wt 231.1 lb

## 2016-02-24 DIAGNOSIS — I10 Essential (primary) hypertension: Secondary | ICD-10-CM

## 2016-02-24 DIAGNOSIS — F332 Major depressive disorder, recurrent severe without psychotic features: Secondary | ICD-10-CM | POA: Diagnosis not present

## 2016-02-24 DIAGNOSIS — R059 Cough, unspecified: Secondary | ICD-10-CM

## 2016-02-24 DIAGNOSIS — R05 Cough: Secondary | ICD-10-CM | POA: Diagnosis not present

## 2016-02-24 DIAGNOSIS — C8400 Mycosis fungoides, unspecified site: Secondary | ICD-10-CM | POA: Diagnosis not present

## 2016-02-24 MED ORDER — BENZONATATE 200 MG PO CAPS: 200.0000 mg | ORAL_CAPSULE | Freq: Three times a day (TID) | ORAL | Status: DC | PRN

## 2016-02-24 NOTE — Progress Notes (Signed)
Pre visit review using our clinic review tool, if applicable. No additional management support is needed unless otherwise documented below in the visit note. 
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
## 2016-02-24 NOTE — Patient Instructions (Signed)
Follow up for any fever or increased shortness of breath. 

## 2016-02-26 DIAGNOSIS — C8400 Mycosis fungoides, unspecified site: Secondary | ICD-10-CM | POA: Diagnosis not present

## 2016-02-28 ENCOUNTER — Telehealth: Payer: Self-pay | Admitting: *Deleted

## 2016-02-28 DIAGNOSIS — C8400 Mycosis fungoides, unspecified site: Secondary | ICD-10-CM | POA: Diagnosis not present

## 2016-02-28 NOTE — Telephone Encounter (Signed)
Left message for patient to call me

## 2016-03-03 ENCOUNTER — Encounter: Payer: Medicare Other | Admitting: Physical Therapy

## 2016-03-03 DIAGNOSIS — Z862 Personal history of diseases of the blood and blood-forming organs and certain disorders involving the immune mechanism: Secondary | ICD-10-CM | POA: Diagnosis not present

## 2016-03-03 DIAGNOSIS — E1165 Type 2 diabetes mellitus with hyperglycemia: Secondary | ICD-10-CM | POA: Diagnosis not present

## 2016-03-03 DIAGNOSIS — Z794 Long term (current) use of insulin: Secondary | ICD-10-CM | POA: Diagnosis not present

## 2016-03-03 DIAGNOSIS — E1142 Type 2 diabetes mellitus with diabetic polyneuropathy: Secondary | ICD-10-CM | POA: Diagnosis not present

## 2016-03-03 DIAGNOSIS — C8400 Mycosis fungoides, unspecified site: Secondary | ICD-10-CM | POA: Diagnosis not present

## 2016-03-03 DIAGNOSIS — Z79899 Other long term (current) drug therapy: Secondary | ICD-10-CM | POA: Diagnosis not present

## 2016-03-04 DIAGNOSIS — C8400 Mycosis fungoides, unspecified site: Secondary | ICD-10-CM | POA: Diagnosis not present

## 2016-03-05 ENCOUNTER — Ambulatory Visit: Payer: Medicare Other | Admitting: Physical Therapy

## 2016-03-06 DIAGNOSIS — C8400 Mycosis fungoides, unspecified site: Secondary | ICD-10-CM | POA: Diagnosis not present

## 2016-03-11 DIAGNOSIS — C8400 Mycosis fungoides, unspecified site: Secondary | ICD-10-CM | POA: Diagnosis not present

## 2016-03-13 ENCOUNTER — Other Ambulatory Visit: Payer: Self-pay | Admitting: Cardiovascular Disease

## 2016-03-13 DIAGNOSIS — C8400 Mycosis fungoides, unspecified site: Secondary | ICD-10-CM | POA: Diagnosis not present

## 2016-03-13 NOTE — Telephone Encounter (Signed)
Rx(s) sent to pharmacy electronically.  

## 2016-03-20 DIAGNOSIS — C8400 Mycosis fungoides, unspecified site: Secondary | ICD-10-CM | POA: Diagnosis not present

## 2016-03-24 ENCOUNTER — Ambulatory Visit: Payer: Medicare Other | Admitting: Family Medicine

## 2016-03-24 DIAGNOSIS — Z0289 Encounter for other administrative examinations: Secondary | ICD-10-CM

## 2016-03-25 DIAGNOSIS — C8400 Mycosis fungoides, unspecified site: Secondary | ICD-10-CM | POA: Diagnosis not present

## 2016-03-27 DIAGNOSIS — C8400 Mycosis fungoides, unspecified site: Secondary | ICD-10-CM | POA: Diagnosis not present

## 2016-03-31 ENCOUNTER — Other Ambulatory Visit: Payer: Self-pay | Admitting: Adult Health

## 2016-03-31 ENCOUNTER — Ambulatory Visit: Payer: PPO | Admitting: *Deleted

## 2016-03-31 ENCOUNTER — Other Ambulatory Visit: Payer: Self-pay | Admitting: Family Medicine

## 2016-03-31 DIAGNOSIS — E0849 Diabetes mellitus due to underlying condition with other diabetic neurological complication: Secondary | ICD-10-CM

## 2016-03-31 NOTE — Progress Notes (Signed)
Patient ID: Eric Price, male   DOB: 04-04-1951, 65 y.o.   MRN: [id number] Patient presents to be scanned and measured for diabetic shoes and inserts.

## 2016-03-31 NOTE — Telephone Encounter (Signed)
Ok to refill 

## 2016-04-01 DIAGNOSIS — C8400 Mycosis fungoides, unspecified site: Secondary | ICD-10-CM | POA: Diagnosis not present

## 2016-04-01 NOTE — Telephone Encounter (Signed)
Is he still having muscle pain? If so, we may need to refer to orthopedics

## 2016-04-01 NOTE — Telephone Encounter (Signed)
Left message for patient to return phone call.  

## 2016-04-02 ENCOUNTER — Other Ambulatory Visit: Payer: Medicare Other | Admitting: Podiatry

## 2016-04-03 ENCOUNTER — Other Ambulatory Visit: Payer: Self-pay | Admitting: Adult Health

## 2016-04-03 DIAGNOSIS — C8400 Mycosis fungoides, unspecified site: Secondary | ICD-10-CM | POA: Diagnosis not present

## 2016-04-03 MED ORDER — METHOCARBAMOL 750 MG PO TABS: 750.0000 mg | ORAL_TABLET | Freq: Three times a day (TID) | ORAL | Status: DC | PRN

## 2016-04-03 NOTE — Telephone Encounter (Signed)
I contacted patient. He states that he still has pain every night and would like a referral to ortho. Ok to refill med until he can get an appt?

## 2016-04-07 NOTE — Telephone Encounter (Signed)
Ok to refill for 30 days with 1 refill. I will need to see him before a referral can be placed due to time frame since last visit.

## 2016-04-09 ENCOUNTER — Other Ambulatory Visit: Payer: Self-pay | Admitting: *Deleted

## 2016-04-09 ENCOUNTER — Other Ambulatory Visit: Payer: Self-pay | Admitting: Family Medicine

## 2016-04-10 DIAGNOSIS — C8400 Mycosis fungoides, unspecified site: Secondary | ICD-10-CM | POA: Diagnosis not present

## 2016-04-24 ENCOUNTER — Other Ambulatory Visit: Payer: Self-pay | Admitting: Family Medicine

## 2016-04-29 DIAGNOSIS — C8400 Mycosis fungoides, unspecified site: Secondary | ICD-10-CM | POA: Diagnosis not present

## 2016-05-01 DIAGNOSIS — C8400 Mycosis fungoides, unspecified site: Secondary | ICD-10-CM | POA: Diagnosis not present

## 2016-05-06 ENCOUNTER — Ambulatory Visit (INDEPENDENT_AMBULATORY_CARE_PROVIDER_SITE_OTHER): Payer: PPO | Admitting: Podiatry

## 2016-05-06 DIAGNOSIS — L84 Corns and callosities: Secondary | ICD-10-CM

## 2016-05-06 DIAGNOSIS — E0849 Diabetes mellitus due to underlying condition with other diabetic neurological complication: Secondary | ICD-10-CM

## 2016-05-06 DIAGNOSIS — C8400 Mycosis fungoides, unspecified site: Secondary | ICD-10-CM | POA: Diagnosis not present

## 2016-05-06 DIAGNOSIS — Z89432 Acquired absence of left foot: Secondary | ICD-10-CM | POA: Diagnosis not present

## 2016-05-06 NOTE — Progress Notes (Signed)
Patient presents for diabetic shoe pick up, shoe is tried on for good fit, patient has amputation of left foot.  Patient received 1 pair Apex G801M Active walker black in men's 10.5 wide and 3 custom molded diabetic inserts.  Verbal and written break in and wear instructions given.  Patient will follow up for scheduled routine care.

## 2016-05-06 NOTE — Patient Instructions (Signed)

## 2016-05-07 NOTE — Progress Notes (Signed)
Subjective:     Patient ID: Eric Price, male   DOB: 05-Nov-1950, 65 y.o.   MRN: [id number]  HPI   Review of Systems     Objective:   Physical Exam Neurological sensation is quite diminished with patient found to have a pre-ulcerated of type lesion right with pain and keratotic tissue formation and also is noted to have significant diminishment of sharp [place] vibratory and vascular status    Assessment:     At risk diabetic who is had previous ulcerated of lesion and has had history of below the knee amputation left    Plan:     Diabetic shoe was dispensed with customized insoles to reduce all pressure on the plantar foot

## 2016-05-13 ENCOUNTER — Encounter: Payer: Self-pay | Admitting: Family Medicine

## 2016-05-13 ENCOUNTER — Ambulatory Visit (INDEPENDENT_AMBULATORY_CARE_PROVIDER_SITE_OTHER): Payer: PPO | Admitting: Family Medicine

## 2016-05-13 VITALS — BP 110/72 | HR 105 | Temp 98.5°F | Ht 69.0 in | Wt 228.4 lb

## 2016-05-13 DIAGNOSIS — E1165 Type 2 diabetes mellitus with hyperglycemia: Secondary | ICD-10-CM | POA: Diagnosis not present

## 2016-05-13 DIAGNOSIS — C8400 Mycosis fungoides, unspecified site: Secondary | ICD-10-CM | POA: Diagnosis not present

## 2016-05-13 DIAGNOSIS — R350 Frequency of micturition: Secondary | ICD-10-CM | POA: Diagnosis not present

## 2016-05-13 DIAGNOSIS — R413 Other amnesia: Secondary | ICD-10-CM | POA: Diagnosis not present

## 2016-05-13 DIAGNOSIS — E1142 Type 2 diabetes mellitus with diabetic polyneuropathy: Secondary | ICD-10-CM | POA: Diagnosis not present

## 2016-05-13 DIAGNOSIS — C8408 Mycosis fungoides, lymph nodes of multiple sites: Secondary | ICD-10-CM | POA: Diagnosis not present

## 2016-05-13 DIAGNOSIS — IMO0002 Reserved for concepts with insufficient information to code with codable children: Secondary | ICD-10-CM

## 2016-05-13 LAB — POCT URINALYSIS DIPSTICK
BILIRUBIN UA: NEGATIVE
KETONES UA: NEGATIVE
LEUKOCYTES UA: NEGATIVE
Nitrite, UA: NEGATIVE
PH UA: 5.5
PROTEIN UA: NEGATIVE
RBC UA: NEGATIVE
SPEC GRAV UA: 1.01
Urobilinogen, UA: 0.2
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
## 2016-05-13 NOTE — Progress Notes (Signed)
Pre visit review using our clinic review tool, if applicable. No additional management support is needed unless otherwise documented below in the visit note. 

## 2016-05-14 DIAGNOSIS — E1165 Type 2 diabetes mellitus with hyperglycemia: Secondary | ICD-10-CM | POA: Diagnosis not present

## 2016-05-14 DIAGNOSIS — Z794 Long term (current) use of insulin: Secondary | ICD-10-CM | POA: Diagnosis not present

## 2016-05-15 DIAGNOSIS — C8400 Mycosis fungoides, unspecified site: Secondary | ICD-10-CM | POA: Diagnosis not present

## 2016-05-15 LAB — GLUCOSE, POCT (MANUAL RESULT ENTRY): POC Glucose: 256 mg/dl — AB (ref 70–99)

## 2016-05-18 ENCOUNTER — Other Ambulatory Visit: Payer: Self-pay | Admitting: Family Medicine

## 2016-05-20 DIAGNOSIS — D699 Hemorrhagic condition, unspecified: Secondary | ICD-10-CM | POA: Diagnosis not present

## 2016-05-24 ENCOUNTER — Other Ambulatory Visit: Payer: Self-pay | Admitting: Family Medicine

## 2016-05-27 DIAGNOSIS — C8400 Mycosis fungoides, unspecified site: Secondary | ICD-10-CM | POA: Diagnosis not present

## 2016-06-01 DIAGNOSIS — C8400 Mycosis fungoides, unspecified site: Secondary | ICD-10-CM | POA: Diagnosis not present

## 2016-06-03 DIAGNOSIS — C8400 Mycosis fungoides, unspecified site: Secondary | ICD-10-CM | POA: Diagnosis not present

## 2016-06-04 ENCOUNTER — Encounter: Payer: Self-pay | Admitting: Family Medicine

## 2016-06-04 ENCOUNTER — Telehealth: Payer: Self-pay | Admitting: Family Medicine

## 2016-06-04 DIAGNOSIS — L309 Dermatitis, unspecified: Secondary | ICD-10-CM | POA: Insufficient documentation

## 2016-06-04 NOTE — Telephone Encounter (Signed)
PA submitted by phone and was initially denied.  Kirsten Psychologist, forensic) stated that a pharmacist will call to ask more questions and may then be approved at that time.  She game me [place] # of [id number] to give to the pharmacist when he/she calls.

## 2016-06-05 DIAGNOSIS — C8400 Mycosis fungoides, unspecified site: Secondary | ICD-10-CM | POA: Diagnosis not present

## 2016-06-10 ENCOUNTER — Telehealth: Payer: Self-pay

## 2016-06-10 DIAGNOSIS — C8400 Mycosis fungoides, unspecified site: Secondary | ICD-10-CM | POA: Diagnosis not present

## 2016-06-10 NOTE — Telephone Encounter (Signed)
Received fax from insurance company that PA for hydroxyzine hcl 25mg  tablets has been approved until 12.31.2017. Pharmacy aware & will fill for patient.

## 2016-06-12 DIAGNOSIS — C8400 Mycosis fungoides, unspecified site: Secondary | ICD-10-CM | POA: Diagnosis not present

## 2016-06-19 DIAGNOSIS — C8400 Mycosis fungoides, unspecified site: Secondary | ICD-10-CM | POA: Diagnosis not present

## 2016-06-22 ENCOUNTER — Ambulatory Visit (INDEPENDENT_AMBULATORY_CARE_PROVIDER_SITE_OTHER): Payer: PPO | Admitting: Family Medicine

## 2016-06-22 ENCOUNTER — Encounter: Payer: Self-pay | Admitting: Family Medicine

## 2016-06-22 VITALS — BP 120/80 | HR 93 | Temp 98.3°F | Ht 69.0 in | Wt 238.1 lb

## 2016-06-22 DIAGNOSIS — Z23 Encounter for immunization: Secondary | ICD-10-CM

## 2016-06-22 DIAGNOSIS — R413 Other amnesia: Secondary | ICD-10-CM

## 2016-06-22 DIAGNOSIS — E1142 Type 2 diabetes mellitus with diabetic polyneuropathy: Secondary | ICD-10-CM | POA: Diagnosis not present

## 2016-06-22 DIAGNOSIS — R4189 Other symptoms and signs involving cognitive functions and awareness: Secondary | ICD-10-CM | POA: Diagnosis not present

## 2016-06-22 DIAGNOSIS — Z79899 Other long term (current) drug therapy: Secondary | ICD-10-CM | POA: Diagnosis not present

## 2016-06-22 DIAGNOSIS — IMO0002 Reserved for concepts with insufficient information to code with codable children: Secondary | ICD-10-CM

## 2016-06-22 DIAGNOSIS — E1165 Type 2 diabetes mellitus with hyperglycemia: Secondary | ICD-10-CM

## 2016-06-22 LAB — BASIC METABOLIC PANEL
BUN: 11 mg/dL (ref 6–23)
CALCIUM: 9.3 mg/dL (ref 8.4–10.5)
CO2: 28 meq/L (ref 19–32)
CREATININE: 0.86 mg/dL (ref 0.40–1.50)
Chloride: 100 mEq/L (ref 96–112)
GFR: 114.42 mL/min (ref >60.00)
GLUCOSE: 169 mg/dL — AB (ref 70–99)
Potassium: 4.2 mEq/L (ref 3.5–5.1)
Sodium: 135 mEq/L (ref 135–145)

## 2016-06-22 LAB — CBC WITH DIFFERENTIAL/PLATELET
BASOS ABS: 0 10*3/uL (ref 0.0–0.1)
Basophils Relative: 0.9 % (ref 0.0–3.0)
EOS ABS: 0.3 10*3/uL (ref 0.0–0.7)
Eosinophils Relative: 5.9 % — ABNORMAL HIGH (ref 0.0–5.0)
HCT: 39.3 % (ref 39.0–52.0)
Hemoglobin: 13.6 g/dL (ref 13.0–17.0)
Lymphocytes Relative: 29.8 % (ref 12.0–46.0)
Lymphs Abs: 1.6 10*3/uL (ref 0.7–4.0)
MCHC: 34.6 g/dL (ref 30.0–36.0)
MCV: 81.2 fl (ref 78.0–100.0)
MONO ABS: 0.4 10*3/uL (ref 0.1–1.0)
Monocytes Relative: 6.8 % (ref 3.0–12.0)
NEUTROS ABS: 3 10*3/uL (ref 1.4–7.7)
NEUTROS PCT: 56.6 % (ref 43.0–77.0)
PLATELETS: 304 10*3/uL (ref 150.0–400.0)
RBC: 4.85 Mil/uL (ref 4.22–5.81)
RDW: 13.2 % (ref 11.5–15.5)
WBC: 5.3 10*3/uL (ref 4.0–10.5)

## 2016-06-22 LAB — TSH: TSH: 0.23 u[IU]/mL — ABNORMAL LOW (ref 0.35–4.50)

## 2016-06-22 LAB — HEPATIC FUNCTION PANEL
ALBUMIN: 4.1 g/dL (ref 3.5–5.2)
ALK PHOS: 88 U/L (ref 39–117)
ALT: 26 U/L (ref 0–53)
AST: 19 U/L (ref 0–37)
BILIRUBIN DIRECT: 0.1 mg/dL (ref 0.0–0.3)
Total Bilirubin: 0.3 mg/dL (ref 0.2–1.2)
Total Protein: 7 g/dL (ref 6.0–8.3)

## 2016-06-22 LAB — VITAMIN B12: VITAMIN B 12: 408 pg/mL (ref 211–911)
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
## 2016-06-22 NOTE — Patient Instructions (Signed)
Check with insurance to see what type (s) of insulin they will cover.

## 2016-06-23 ENCOUNTER — Ambulatory Visit: Payer: PPO | Admitting: Family Medicine

## 2016-06-23 ENCOUNTER — Other Ambulatory Visit: Payer: Self-pay

## 2016-06-23 DIAGNOSIS — R7989 Other specified abnormal findings of blood chemistry: Secondary | ICD-10-CM

## 2016-06-29 ENCOUNTER — Other Ambulatory Visit: Payer: Self-pay | Admitting: Dermatology

## 2016-06-29 ENCOUNTER — Ambulatory Visit
Admission: RE | Admit: 2016-06-29 | Discharge: 2016-06-29 | Disposition: A | Discharge status: Home / Self Care | Payer: PPO | Source: Ambulatory Visit | Attending: Dermatology | Admitting: Dermatology

## 2016-06-29 DIAGNOSIS — R918 Other nonspecific abnormal finding of lung field: Secondary | ICD-10-CM | POA: Diagnosis not present

## 2016-06-29 DIAGNOSIS — M7989 Other specified soft tissue disorders: Secondary | ICD-10-CM | POA: Diagnosis not present

## 2016-06-29 DIAGNOSIS — Z862 Personal history of diseases of the blood and blood-forming organs and certain disorders involving the immune mechanism: Secondary | ICD-10-CM | POA: Diagnosis not present

## 2016-06-29 DIAGNOSIS — E1165 Type 2 diabetes mellitus with hyperglycemia: Secondary | ICD-10-CM | POA: Diagnosis not present

## 2016-06-29 DIAGNOSIS — C8400 Mycosis fungoides, unspecified site: Secondary | ICD-10-CM | POA: Diagnosis not present

## 2016-06-29 DIAGNOSIS — Z794 Long term (current) use of insulin: Secondary | ICD-10-CM | POA: Diagnosis not present

## 2016-06-29 DIAGNOSIS — C84A0 Cutaneous T-cell lymphoma, unspecified, unspecified site: Secondary | ICD-10-CM | POA: Diagnosis not present

## 2016-06-29 DIAGNOSIS — K760 Fatty (change of) liver, not elsewhere classified: Secondary | ICD-10-CM | POA: Diagnosis not present

## 2016-06-29 DIAGNOSIS — Z79899 Other long term (current) drug therapy: Secondary | ICD-10-CM | POA: Diagnosis not present

## 2016-06-29 MED ORDER — IOPAMIDOL (ISOVUE-300) INJECTION 61%
125.0000 mL | Freq: Once | INTRAVENOUS | Status: AC | PRN
  Administered 2016-06-29: 125 mL via INTRAVENOUS

## 2016-07-01 ENCOUNTER — Encounter: Payer: Self-pay | Admitting: Family Medicine

## 2016-07-01 ENCOUNTER — Ambulatory Visit (INDEPENDENT_AMBULATORY_CARE_PROVIDER_SITE_OTHER): Payer: PPO | Admitting: Family Medicine

## 2016-07-01 VITALS — BP 144/84 | HR 108 | Temp 98.7°F | Ht 69.0 in | Wt 231.0 lb

## 2016-07-01 DIAGNOSIS — C8408 Mycosis fungoides, lymph nodes of multiple sites: Secondary | ICD-10-CM

## 2016-07-01 DIAGNOSIS — R05 Cough: Secondary | ICD-10-CM

## 2016-07-01 DIAGNOSIS — E1165 Type 2 diabetes mellitus with hyperglycemia: Secondary | ICD-10-CM

## 2016-07-01 DIAGNOSIS — IMO0002 Reserved for concepts with insufficient information to code with codable children: Secondary | ICD-10-CM

## 2016-07-01 DIAGNOSIS — R059 Cough, unspecified: Secondary | ICD-10-CM

## 2016-07-01 DIAGNOSIS — E1142 Type 2 diabetes mellitus with diabetic polyneuropathy: Secondary | ICD-10-CM | POA: Diagnosis not present

## 2016-07-01 MED ORDER — METFORMIN HCL 500 MG PO TABS: 1000.0000 mg | ORAL_TABLET | Freq: Two times a day (BID) | ORAL | 3 refills | Status: DC
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
## 2016-07-01 NOTE — Progress Notes (Signed)
Pre visit review using our clinic review tool, if applicable. No additional management support is needed unless otherwise documented below in the visit note. 

## 2016-07-01 NOTE — Patient Instructions (Addendum)
Start back Levemir 20 units once daily and TITRATE UP 2 UNITS EVERY 2 DAYS UNTIL FASTING SUGARS CONSISTENTLY < 130. We may need to add back Novolog with meals but would start with the above first. Keep record of home sugars and bring back for review at follow up.

## 2016-07-06 ENCOUNTER — Ambulatory Visit
Admission: RE | Admit: 2016-07-06 | Discharge: 2016-07-06 | Disposition: A | Discharge status: Home / Self Care | Payer: PPO | Source: Ambulatory Visit | Attending: Dermatology | Admitting: Dermatology

## 2016-07-06 DIAGNOSIS — M7989 Other specified soft tissue disorders: Secondary | ICD-10-CM

## 2016-07-06 DIAGNOSIS — R600 Localized edema: Secondary | ICD-10-CM | POA: Diagnosis not present

## 2016-07-06 DIAGNOSIS — C8409 Mycosis fungoides, extranodal and solid organ sites: Secondary | ICD-10-CM | POA: Diagnosis not present

## 2016-07-06 DIAGNOSIS — C8400 Mycosis fungoides, unspecified site: Secondary | ICD-10-CM | POA: Diagnosis not present

## 2016-07-13 DIAGNOSIS — Z79899 Other long term (current) drug therapy: Secondary | ICD-10-CM | POA: Diagnosis not present

## 2016-07-13 DIAGNOSIS — C8409 Mycosis fungoides, extranodal and solid organ sites: Secondary | ICD-10-CM | POA: Diagnosis not present

## 2016-07-14 ENCOUNTER — Telehealth: Payer: Self-pay | Admitting: Family Medicine

## 2016-07-14 ENCOUNTER — Other Ambulatory Visit: Payer: PPO

## 2016-07-14 NOTE — Telephone Encounter (Signed)
Pt state that he is not feeling any better he is having a lot of wheezing when he is sleep and would like to see if you can call him in something.  Pharm: [place]

## 2016-07-15 DIAGNOSIS — C8409 Mycosis fungoides, extranodal and solid organ sites: Secondary | ICD-10-CM | POA: Diagnosis not present

## 2016-07-15 NOTE — Telephone Encounter (Signed)
Tried calling pt with NA. 

## 2016-07-15 NOTE — Telephone Encounter (Signed)
Confirm no fever.  Albuterol 2 puffs every 4-6 hours prn cough and wheeze.

## 2016-07-16 ENCOUNTER — Other Ambulatory Visit: Payer: Self-pay | Admitting: Cardiovascular Disease

## 2016-07-16 ENCOUNTER — Ambulatory Visit (INDEPENDENT_AMBULATORY_CARE_PROVIDER_SITE_OTHER): Payer: PPO | Admitting: Cardiovascular Disease

## 2016-07-16 ENCOUNTER — Encounter: Payer: Self-pay | Admitting: Cardiovascular Disease

## 2016-07-16 VITALS — BP 140/82 | HR 86 | Ht 69.0 in | Wt 236.0 lb

## 2016-07-16 DIAGNOSIS — I472 Ventricular tachycardia: Secondary | ICD-10-CM | POA: Diagnosis not present

## 2016-07-16 DIAGNOSIS — C8408 Mycosis fungoides, lymph nodes of multiple sites: Secondary | ICD-10-CM

## 2016-07-16 DIAGNOSIS — IMO0002 Reserved for concepts with insufficient information to code with codable children: Secondary | ICD-10-CM

## 2016-07-16 DIAGNOSIS — I2510 Atherosclerotic heart disease of native coronary artery without angina pectoris: Secondary | ICD-10-CM

## 2016-07-16 DIAGNOSIS — I739 Peripheral vascular disease, unspecified: Secondary | ICD-10-CM

## 2016-07-16 DIAGNOSIS — Z79899 Other long term (current) drug therapy: Secondary | ICD-10-CM

## 2016-07-16 DIAGNOSIS — I5042 Chronic combined systolic (congestive) and diastolic (congestive) heart failure: Secondary | ICD-10-CM | POA: Diagnosis not present

## 2016-07-16 DIAGNOSIS — E1142 Type 2 diabetes mellitus with diabetic polyneuropathy: Secondary | ICD-10-CM

## 2016-07-16 DIAGNOSIS — Z95810 Presence of automatic (implantable) cardiac defibrillator: Secondary | ICD-10-CM

## 2016-07-16 DIAGNOSIS — I4729 Other ventricular tachycardia: Secondary | ICD-10-CM

## 2016-07-16 DIAGNOSIS — I10 Essential (primary) hypertension: Secondary | ICD-10-CM

## 2016-07-16 DIAGNOSIS — C8409 Mycosis fungoides, extranodal and solid organ sites: Secondary | ICD-10-CM | POA: Diagnosis not present

## 2016-07-16 DIAGNOSIS — E782 Mixed hyperlipidemia: Secondary | ICD-10-CM

## 2016-07-16 DIAGNOSIS — E1165 Type 2 diabetes mellitus with hyperglycemia: Secondary | ICD-10-CM

## 2016-07-16 MED ORDER — LISINOPRIL 2.5 MG PO TABS: 5.0000 mg | ORAL_TABLET | Freq: Every day | ORAL | 11 refills | Status: DC

## 2016-07-16 MED ORDER — FUROSEMIDE 40 MG PO TABS: 40.0000 mg | ORAL_TABLET | Freq: Every day | ORAL | 11 refills | Status: DC

## 2016-07-16 MED ORDER — ALBUTEROL SULFATE HFA 108 (90 BASE) MCG/ACT IN AERS: 2.0000 | INHALATION_SPRAY | RESPIRATORY_TRACT | 2 refills | Status: DC | PRN
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
## 2016-07-16 NOTE — Telephone Encounter (Signed)
Left a detailed message for pt on cell phone and medication sent to pharmacy. He was advised to call back if he has a fever.

## 2016-07-16 NOTE — Patient Instructions (Signed)
Dr Sallyanne Kuster has recommended making the following medication changes: 1. INCREASE Lisinopril to 5 mg daily 2. START Furosemide 40 mg - take 1 tablet by mouth daily  Your physician recommends that you return for lab work in 2-3 weeks.  Remote monitoring is used to monitor your Pacemaker of ICD from home. This monitoring reduces the number of office visits required to check your device to one time per year. It allows [place] to keep an eye on the functioning of your device to ensure it is working properly. You are scheduled for a device check from home on Thursday, January 11th, 2017. You may send your transmission at any time that day. If you have a wireless device, the transmission will be sent automatically. After your physician reviews your transmission, you will receive a postcard with your next transmission date.  Dr Sallyanne Kuster recommends that you schedule a follow-up appointment in 12 months with a device check. You will receive a reminder letter in the mail two months in advance. If you don't receive a letter, please call our office to schedule the follow-up appointment.  If you need a refill on your cardiac medications before your next appointment, please call your pharmacy.   Your physician recommends that you schedule a follow-up appointment with Dr Gwenlyn Found first available. An appointment has already been made for you - October 24th, 2017 at 3:45p.

## 2016-07-17 ENCOUNTER — Encounter: Payer: Self-pay | Admitting: Cardiovascular Disease

## 2016-07-17 ENCOUNTER — Ambulatory Visit: Payer: PPO | Admitting: Family Medicine

## 2016-07-17 DIAGNOSIS — C8409 Mycosis fungoides, extranodal and solid organ sites: Secondary | ICD-10-CM | POA: Diagnosis not present

## 2016-07-17 DIAGNOSIS — I472 Ventricular tachycardia: Secondary | ICD-10-CM | POA: Insufficient documentation

## 2016-07-17 DIAGNOSIS — I4729 Other ventricular tachycardia: Secondary | ICD-10-CM | POA: Insufficient documentation

## 2016-07-17 DIAGNOSIS — I739 Peripheral vascular disease, unspecified: Secondary | ICD-10-CM | POA: Insufficient documentation

## 2016-07-17 DIAGNOSIS — I5042 Chronic combined systolic (congestive) and diastolic (congestive) heart failure: Secondary | ICD-10-CM

## 2016-07-17 HISTORY — DX: Chronic combined systolic (congestive) and diastolic (congestive) heart failure: I50.42

## 2016-07-28 ENCOUNTER — Ambulatory Visit: Payer: PPO | Admitting: Cardiovascular Disease

## 2016-08-04 ENCOUNTER — Other Ambulatory Visit (INDEPENDENT_AMBULATORY_CARE_PROVIDER_SITE_OTHER): Payer: PPO

## 2016-08-04 DIAGNOSIS — R946 Abnormal results of thyroid function studies: Secondary | ICD-10-CM

## 2016-08-04 DIAGNOSIS — R7989 Other specified abnormal findings of blood chemistry: Secondary | ICD-10-CM

## 2016-08-04 LAB — TSH: TSH: 0.96 u[IU]/mL (ref 0.35–4.50)

## 2016-08-04 LAB — T4, FREE: Free T4: 0.72 ng/dL (ref 0.60–1.60)

## 2016-08-17 DIAGNOSIS — E1165 Type 2 diabetes mellitus with hyperglycemia: Secondary | ICD-10-CM | POA: Diagnosis not present

## 2016-08-17 DIAGNOSIS — E785 Hyperlipidemia, unspecified: Secondary | ICD-10-CM | POA: Diagnosis not present

## 2016-08-17 DIAGNOSIS — Z794 Long term (current) use of insulin: Secondary | ICD-10-CM | POA: Diagnosis not present

## 2016-08-18 DIAGNOSIS — C8400 Mycosis fungoides, unspecified site: Secondary | ICD-10-CM | POA: Diagnosis not present

## 2016-08-18 DIAGNOSIS — C8409 Mycosis fungoides, extranodal and solid organ sites: Secondary | ICD-10-CM | POA: Diagnosis not present

## 2016-08-18 DIAGNOSIS — Z79899 Other long term (current) drug therapy: Secondary | ICD-10-CM | POA: Diagnosis not present

## 2016-08-24 ENCOUNTER — Telehealth: Payer: Self-pay | Admitting: Family Medicine

## 2016-08-24 NOTE — Telephone Encounter (Signed)
° °  Pt call to say he was told his prosthetis was wore out and that is what is causing his back and spine pain. He call to ask for a order to have a new one.    Bio -Psychologist, prison and probation services and orthotics   Algie Coffer  [facility]  FAX NUMBER [place]  Phone number [phone]

## 2016-08-25 NOTE — Telephone Encounter (Signed)
FYI: Spoke with pt--he needs a Face to Face OV and  Written RX faxed to company. He is coming in tomorrow to be seen for this.

## 2016-08-26 ENCOUNTER — Encounter: Payer: Self-pay | Admitting: Family Medicine

## 2016-08-26 ENCOUNTER — Ambulatory Visit (INDEPENDENT_AMBULATORY_CARE_PROVIDER_SITE_OTHER): Payer: PPO | Admitting: Family Medicine

## 2016-08-26 VITALS — BP 110/70 | HR 105 | Temp 98.1°F | Ht 69.0 in | Wt 232.0 lb

## 2016-08-26 DIAGNOSIS — Z89512 Acquired absence of left leg below knee: Secondary | ICD-10-CM

## 2016-08-26 DIAGNOSIS — E1142 Type 2 diabetes mellitus with diabetic polyneuropathy: Secondary | ICD-10-CM | POA: Diagnosis not present

## 2016-08-26 DIAGNOSIS — I739 Peripheral vascular disease, unspecified: Secondary | ICD-10-CM

## 2016-08-26 DIAGNOSIS — IMO0002 Reserved for concepts with insufficient information to code with codable children: Secondary | ICD-10-CM

## 2016-08-26 DIAGNOSIS — E1165 Type 2 diabetes mellitus with hyperglycemia: Secondary | ICD-10-CM | POA: Diagnosis not present

## 2016-08-26 NOTE — Progress Notes (Signed)
Pre visit review using our clinic review tool, if applicable. No additional management support is needed unless otherwise documented below in the visit note. 
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
## 2016-09-04 ENCOUNTER — Ambulatory Visit: Payer: PPO | Admitting: Cardiovascular Disease

## 2016-09-18 DIAGNOSIS — Z79899 Other long term (current) drug therapy: Secondary | ICD-10-CM | POA: Diagnosis not present

## 2016-09-18 DIAGNOSIS — C8400 Mycosis fungoides, unspecified site: Secondary | ICD-10-CM | POA: Diagnosis not present

## 2016-09-21 ENCOUNTER — Other Ambulatory Visit: Payer: Self-pay | Admitting: Family Medicine

## 2016-09-21 ENCOUNTER — Ambulatory Visit: Payer: PPO | Admitting: Family Medicine

## 2016-09-22 ENCOUNTER — Ambulatory Visit (INDEPENDENT_AMBULATORY_CARE_PROVIDER_SITE_OTHER): Payer: PPO | Admitting: Family Medicine

## 2016-09-22 ENCOUNTER — Encounter: Payer: Self-pay | Admitting: Family Medicine

## 2016-09-22 VITALS — BP 110/86 | HR 89 | Temp 98.2°F | Ht 69.0 in | Wt 227.0 lb

## 2016-09-22 DIAGNOSIS — E1165 Type 2 diabetes mellitus with hyperglycemia: Secondary | ICD-10-CM | POA: Diagnosis not present

## 2016-09-22 DIAGNOSIS — E1142 Type 2 diabetes mellitus with diabetic polyneuropathy: Secondary | ICD-10-CM

## 2016-09-22 DIAGNOSIS — G546 Phantom limb syndrome with pain: Secondary | ICD-10-CM

## 2016-09-22 DIAGNOSIS — I10 Essential (primary) hypertension: Secondary | ICD-10-CM | POA: Diagnosis not present

## 2016-09-22 DIAGNOSIS — IMO0002 Reserved for concepts with insufficient information to code with codable children: Secondary | ICD-10-CM

## 2016-09-22 MED ORDER — GABAPENTIN 600 MG PO TABS: 600.0000 mg | ORAL_TABLET | Freq: Four times a day (QID) | ORAL | 1 refills | Status: DC

## 2016-09-22 MED ORDER — METHOCARBAMOL 500 MG PO TABS: 500.0000 mg | ORAL_TABLET | Freq: Every evening | ORAL | 1 refills | Status: DC | PRN

## 2016-09-22 MED ORDER — TIZANIDINE HCL 2 MG PO TABS: ORAL_TABLET | ORAL | 1 refills | Status: DC

## 2016-09-22 MED ORDER — INSULIN ASPART 100 UNIT/ML ~~LOC~~ SOLN: SUBCUTANEOUS | 11 refills | Status: DC

## 2016-09-22 MED ORDER — FOLIC ACID 1 MG PO TABS: 1.0000 mg | ORAL_TABLET | Freq: Every day | ORAL | Status: DC

## 2016-09-22 MED ORDER — METHOTREXATE 2.5 MG PO TABS: ORAL_TABLET | ORAL | 0 refills | Status: DC

## 2016-09-22 MED ORDER — METHOCARBAMOL 500 MG PO TABS: 500.0000 mg | ORAL_TABLET | Freq: Two times a day (BID) | ORAL | 1 refills | Status: DC
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
## 2016-09-22 NOTE — Progress Notes (Signed)
Pre visit review using our clinic review tool, if applicable. No additional management support is needed unless otherwise documented below in the visit note. 

## 2016-09-23 ENCOUNTER — Telehealth: Payer: Self-pay

## 2016-09-23 NOTE — Telephone Encounter (Signed)
Received PA request from [facility] for methocarbamol 500 mg tablets. PA submitted & is pending. Key: [id number]

## 2016-09-24 NOTE — Telephone Encounter (Signed)
PA denied because it cannot be used for phantom limb pain. Do you want to try to appeal by using acute painful musculoskeletal conditions or chronic intermittent painful musculoskeletal conditions?

## 2016-09-25 NOTE — Telephone Encounter (Signed)
Appeal paperwork faxed. 

## 2016-09-25 NOTE — Telephone Encounter (Signed)
Yes.  He has had some chronic intermittent low back pain

## 2016-09-29 NOTE — Telephone Encounter (Signed)
PA approved, form faxed back to pharmacy. 

## 2016-10-07 ENCOUNTER — Other Ambulatory Visit: Payer: Self-pay | Admitting: Family Medicine

## 2016-10-07 DIAGNOSIS — Z79899 Other long term (current) drug therapy: Secondary | ICD-10-CM | POA: Diagnosis not present

## 2016-10-07 DIAGNOSIS — C8400 Mycosis fungoides, unspecified site: Secondary | ICD-10-CM | POA: Diagnosis not present

## 2016-10-15 ENCOUNTER — Encounter: Payer: PPO | Admitting: *Deleted

## 2016-10-15 ENCOUNTER — Encounter: Payer: Self-pay | Admitting: Family Medicine

## 2016-10-15 ENCOUNTER — Telehealth: Payer: Self-pay | Admitting: Cardiology

## 2016-10-15 ENCOUNTER — Ambulatory Visit (INDEPENDENT_AMBULATORY_CARE_PROVIDER_SITE_OTHER): Payer: PPO | Admitting: Family Medicine

## 2016-10-15 DIAGNOSIS — M1711 Unilateral primary osteoarthritis, right knee: Secondary | ICD-10-CM

## 2016-10-15 NOTE — Patient Instructions (Signed)
Good to see yo u Eric Price is your friend Try to wear the brace Lets watch the left knee a little bit When you can come back again in 4-6 weeks and we can do orthovisc injections if needed.

## 2016-10-15 NOTE — Telephone Encounter (Signed)
LMOVM reminding pt to send remote transmission.   

## 2016-10-15 NOTE — Assessment & Plan Note (Signed)
Patient given injection today. Has recently been started on methotrexate and we'll watch for any signs of infection. We discussed icing regimen. Discussed if any worsening symptoms we will have him seek medical attention immediately. I do believe that could be a candidate for viscous supplementation. Patient failed conservative therapy. Follow-up again with me in 4 weeks.
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
## 2016-10-16 ENCOUNTER — Encounter: Payer: Self-pay | Admitting: Cardiology

## 2016-10-27 ENCOUNTER — Encounter: Payer: Self-pay | Admitting: Internal Medicine

## 2016-10-27 ENCOUNTER — Ambulatory Visit (INDEPENDENT_AMBULATORY_CARE_PROVIDER_SITE_OTHER): Payer: PPO | Admitting: Internal Medicine

## 2016-10-27 VITALS — BP 140/98 | HR 92 | Ht 68.5 in | Wt 231.0 lb

## 2016-10-27 DIAGNOSIS — E1165 Type 2 diabetes mellitus with hyperglycemia: Secondary | ICD-10-CM

## 2016-10-27 DIAGNOSIS — C8400 Mycosis fungoides, unspecified site: Secondary | ICD-10-CM | POA: Diagnosis not present

## 2016-10-27 DIAGNOSIS — E1159 Type 2 diabetes mellitus with other circulatory complications: Secondary | ICD-10-CM

## 2016-10-27 DIAGNOSIS — Z79899 Other long term (current) drug therapy: Secondary | ICD-10-CM | POA: Diagnosis not present

## 2016-10-27 NOTE — Patient Instructions (Addendum)
Please continue: - Metformin 1000 mg 2x a day with meals. - Novolog 40 units 2x a day before meals  Increase: - Levemir to 50 units 2x a day  Add: - Trulicity [id number] mg weekly. Please call me in ~3 weeks to let me know to order the higher dose, 1.5 mg.  Please return in 1.5 months with your sugar log.   PATIENT INSTRUCTIONS FOR TYPE 2 DIABETES:  **Please join MyChart!** - see attached instructions about how to join if you have not done so already.  DIET AND EXERCISE Diet and exercise is an important part of diabetic treatment.  We recommended aerobic exercise in the form of brisk walking (working between 40-60% of maximal aerobic capacity, similar to brisk walking) for 150 minutes per week (such as 30 minutes five days per week) along with 3 times per week performing 'resistance' training (using various gauge rubber tubes with handles) 5-10 exercises involving the major muscle groups (upper body, lower body and core) performing 10-15 repetitions (or near fatigue) each exercise. Start at half the above goal but build slowly to reach the above goals. If limited by weight, joint pain, or disability, we recommend daily walking in a swimming pool with water up to waist to reduce pressure from joints while allow for adequate exercise.    BLOOD GLUCOSES Monitoring your blood glucoses is important for continued management of your diabetes. Please check your blood glucoses 2-4 times a day: fasting, before meals and at bedtime (you can rotate these measurements - e.g. one day check before the 3 meals, the next day check before 2 of the meals and before bedtime, etc.).   HYPOGLYCEMIA (low blood sugar) Hypoglycemia is usually a reaction to not eating, exercising, or taking too much insulin/ other diabetes drugs.  Symptoms include tremors, sweating, hunger, confusion, headache, etc. Treat IMMEDIATELY with 15 grams of Carbs: . 4 glucose tablets .  cup regular juice/soda . 2 tablespoons raisins . 4  teaspoons sugar . 1 tablespoon honey Recheck blood glucose in 15 mins and repeat above if still symptomatic/blood glucose <100.  RECOMMENDATIONS TO REDUCE YOUR RISK OF DIABETIC COMPLICATIONS: * Take your prescribed MEDICATION(S) * Follow a DIABETIC diet: Complex carbs, fiber rich foods, (monounsaturated and polyunsaturated) fats * AVOID saturated/trans fats, high fat foods, >2,300 mg salt per day. * EXERCISE at least 5 times a week for 30 minutes or preferably daily.  * DO NOT SMOKE OR DRINK more than 1 drink a day. * Check your FEET every day. Do not wear tightfitting shoes. Contact us if you develop an ulcer * See your EYE doctor once a year or more if needed * Get a FLU shot once a year * Get a PNEUMONIA vaccine once before and once after age 75 years  GOALS:  * Your Hemoglobin A1c of <7%  * fasting sugars need to be <130 * after meals sugars need to be <180 (2h after you start eating) * Your Systolic BP should be [id number] or lower  * Your Diastolic BP should be 80 or lower  * Your HDL (Good Cholesterol) should be 40 or higher  * Your LDL (Bad Cholesterol) should be 100 or lower. * Your Triglycerides should be 150 or lower  * Your Urine microalbumin (kidney function) should be <30 * Your Body Mass Index should be 25 or lower    Please consider the following ways to cut down carbs and fat and increase fiber and micronutrients in your diet: - substitute whole grain for  white bread or pasta - substitute brown rice for white rice - substitute 90-calorie flat bread pieces for slices of bread when possible - substitute sweet potatoes or yams for white potatoes - substitute humus for margarine - substitute tofu for cheese when possible - substitute almond or rice milk for regular milk (would not drink soy milk daily due to concern for soy estrogen influence on breast cancer risk) - substitute dark chocolate for other sweets when possible - substitute water - can add lemon or orange slices  for taste - for diet sodas (artificial sweeteners will trick your body that you can eat sweets without getting calories and will lead you to overeating and weight gain in the long run) - do not skip breakfast or other meals (this will slow down the metabolism and will result in more weight gain over time)  - can try smoothies made from fruit and almond/rice milk in am instead of regular breakfast - can also try old-fashioned (not instant) oatmeal made with almond/rice milk in am - order the dressing on the side when eating salad at a restaurant (pour less than half of the dressing on the salad) - eat as little meat as possible - can try juicing, but should not forget that juicing will get rid of the fiber, so would alternate with eating raw veg./fruits or drinking smoothies - use as little oil as possible, even when using olive oil - can dress a salad with a mix of balsamic vinegar and lemon juice, for e.g. - use agave nectar, stevia sugar, or regular sugar rather than artificial sweateners - steam or broil/roast veggies  - snack on veggies/fruit/nuts (unsalted, preferably) when possible, rather than processed foods - reduce or eliminate aspartame in diet (it is in diet sodas, chewing gum, etc) Read the labels!  Try to read Dr. Janene Harvey book: "Program for Reversing Diabetes" for other ideas for healthy eating.
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
## 2016-10-29 DIAGNOSIS — Z89512 Acquired absence of left leg below knee: Secondary | ICD-10-CM | POA: Diagnosis not present

## 2016-11-02 ENCOUNTER — Encounter: Payer: PPO | Admitting: *Deleted

## 2016-11-08 ENCOUNTER — Other Ambulatory Visit: Payer: Self-pay | Admitting: Family Medicine

## 2016-11-16 ENCOUNTER — Other Ambulatory Visit: Payer: Self-pay | Admitting: Family Medicine

## 2016-11-16 DIAGNOSIS — M792 Neuralgia and neuritis, unspecified: Secondary | ICD-10-CM | POA: Diagnosis not present

## 2016-11-16 DIAGNOSIS — Z79899 Other long term (current) drug therapy: Secondary | ICD-10-CM | POA: Diagnosis not present

## 2016-11-16 DIAGNOSIS — L299 Pruritus, unspecified: Secondary | ICD-10-CM | POA: Diagnosis not present

## 2016-11-16 DIAGNOSIS — C8400 Mycosis fungoides, unspecified site: Secondary | ICD-10-CM | POA: Diagnosis not present

## 2016-11-17 ENCOUNTER — Telehealth: Payer: Self-pay

## 2016-11-17 ENCOUNTER — Telehealth: Payer: Self-pay | Admitting: Internal Medicine

## 2016-11-17 MED ORDER — DULAGLUTIDE 0.75 MG/0.5ML ~~LOC~~ SOAJ: SUBCUTANEOUS | 0 refills | Status: DC

## 2016-11-17 NOTE — Telephone Encounter (Signed)
Patient called questioning why he does not have his trulicity submitted yet. I advised it was not on his med list. Dr.Gherghe previous note stated she would like him on this med, start with low dose and contact us in 3 weeks to order the higher dose. I submitted the low dose RX to pharmacy, patient understood and will go pick up and call us in 3 weeks to up the dosage. No other questions at this time.

## 2016-11-17 NOTE — Telephone Encounter (Signed)
Patient is asking you to give him a call.

## 2016-11-20 ENCOUNTER — Other Ambulatory Visit: Payer: Self-pay | Admitting: Family Medicine

## 2016-11-24 DIAGNOSIS — E1159 Type 2 diabetes mellitus with other circulatory complications: Secondary | ICD-10-CM | POA: Diagnosis not present

## 2016-11-24 DIAGNOSIS — M79605 Pain in left leg: Secondary | ICD-10-CM | POA: Diagnosis not present

## 2016-11-24 DIAGNOSIS — Z89512 Acquired absence of left leg below knee: Secondary | ICD-10-CM | POA: Diagnosis not present

## 2016-11-24 DIAGNOSIS — E1165 Type 2 diabetes mellitus with hyperglycemia: Secondary | ICD-10-CM | POA: Diagnosis not present

## 2016-11-24 DIAGNOSIS — G546 Phantom limb syndrome with pain: Secondary | ICD-10-CM | POA: Diagnosis not present

## 2016-11-26 DIAGNOSIS — Z79899 Other long term (current) drug therapy: Secondary | ICD-10-CM | POA: Diagnosis not present

## 2016-11-26 DIAGNOSIS — C8400 Mycosis fungoides, unspecified site: Secondary | ICD-10-CM | POA: Diagnosis not present

## 2016-12-08 ENCOUNTER — Encounter: Payer: Self-pay | Admitting: Internal Medicine

## 2016-12-08 ENCOUNTER — Ambulatory Visit (INDEPENDENT_AMBULATORY_CARE_PROVIDER_SITE_OTHER): Payer: PPO | Admitting: Internal Medicine

## 2016-12-08 VITALS — BP 142/90 | HR 111 | Ht 69.0 in | Wt 239.0 lb

## 2016-12-08 DIAGNOSIS — IMO0002 Reserved for concepts with insufficient information to code with codable children: Secondary | ICD-10-CM

## 2016-12-08 DIAGNOSIS — E1142 Type 2 diabetes mellitus with diabetic polyneuropathy: Secondary | ICD-10-CM

## 2016-12-08 DIAGNOSIS — E1165 Type 2 diabetes mellitus with hyperglycemia: Secondary | ICD-10-CM

## 2016-12-08 MED ORDER — DULAGLUTIDE 1.5 MG/0.5ML ~~LOC~~ SOAJ: SUBCUTANEOUS | 3 refills | Status: DC

## 2016-12-08 MED ORDER — INSULIN DETEMIR 100 UNIT/ML FLEXPEN
50.0000 [IU] | PEN_INJECTOR | Freq: Two times a day (BID) | SUBCUTANEOUS | 3 refills | Status: DC
Start: 2016-12-08 — End: 2017-05-20

## 2016-12-08 MED ORDER — INSULIN ASPART 100 UNIT/ML FLEXPEN: PEN_INJECTOR | SUBCUTANEOUS | 5 refills | Status: DC

## 2016-12-08 NOTE — Patient Instructions (Addendum)
Please increase Trulicity to 1.5 mg weekly in am.  Decrease NovoLog to 34 units 2x a day. Take this 15 min before a meal.  If you eat b'fast, take 16 units before eating.  Continue: - Levemir 50 units 2x a day - Metformin 1000 mg 2x a day with meals.  Please return in 1.5 months with your sugar log.
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
## 2016-12-09 ENCOUNTER — Ambulatory Visit (INDEPENDENT_AMBULATORY_CARE_PROVIDER_SITE_OTHER): Payer: PPO | Admitting: Family Medicine

## 2016-12-09 ENCOUNTER — Encounter: Payer: Self-pay | Admitting: Family Medicine

## 2016-12-09 VITALS — BP 110/80 | HR 91 | Temp 98.6°F | Ht 69.0 in | Wt 239.9 lb

## 2016-12-09 DIAGNOSIS — Z23 Encounter for immunization: Secondary | ICD-10-CM

## 2016-12-09 DIAGNOSIS — Z0000 Encounter for general adult medical examination without abnormal findings: Secondary | ICD-10-CM | POA: Diagnosis not present

## 2016-12-09 LAB — HEPATIC FUNCTION PANEL
ALT: 27 U/L (ref 0–53)
AST: 18 U/L (ref 0–37)
Albumin: 4.2 g/dL (ref 3.5–5.2)
Alkaline Phosphatase: 103 U/L (ref 39–117)
BILIRUBIN TOTAL: 0.4 mg/dL (ref 0.2–1.2)
Bilirubin, Direct: 0.1 mg/dL (ref 0.0–0.3)
Total Protein: 6.5 g/dL (ref 6.0–8.3)

## 2016-12-09 LAB — LIPID PANEL
CHOL/HDL RATIO: 6
Cholesterol: 154 mg/dL (ref 0–200)
HDL: 25.6 mg/dL — AB (ref >39.00)
NONHDL: 128.42
Triglycerides: 236 mg/dL — ABNORMAL HIGH (ref 0.0–149.0)
VLDL: 47.2 mg/dL — ABNORMAL HIGH (ref 0.0–40.0)

## 2016-12-09 LAB — PSA: PSA: 0.87 ng/mL (ref 0.10–4.00)

## 2016-12-09 LAB — LDL CHOLESTEROL, DIRECT: LDL DIRECT: 85 mg/dL

## 2016-12-09 NOTE — Progress Notes (Signed)
Pre visit review using our clinic review tool, if applicable. No additional management support is needed unless otherwise documented below in the visit note. 

## 2016-12-09 NOTE — Patient Instructions (Signed)
Make sure you follow-up with cardiology very soon if you have continued chest symptoms We will call you with lab work done today Your tetanus booster is good for 10 years Try to reduce overall calorie intake and sugar intake
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
## 2016-12-10 ENCOUNTER — Encounter: Payer: Self-pay | Admitting: Family Medicine

## 2016-12-10 LAB — HEPATITIS C ANTIBODY: HCV Ab: NEGATIVE
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
## 2016-12-24 ENCOUNTER — Ambulatory Visit: Payer: PPO | Admitting: Family Medicine
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
## 2016-12-29 ENCOUNTER — Ambulatory Visit: Payer: Self-pay

## 2016-12-29 ENCOUNTER — Encounter: Payer: Self-pay | Admitting: Family Medicine

## 2016-12-29 ENCOUNTER — Ambulatory Visit (INDEPENDENT_AMBULATORY_CARE_PROVIDER_SITE_OTHER): Payer: PPO | Admitting: Family Medicine

## 2016-12-29 VITALS — BP 122/74 | HR 90 | Ht 69.0 in | Wt 236.6 lb

## 2016-12-29 DIAGNOSIS — M25561 Pain in right knee: Secondary | ICD-10-CM

## 2016-12-29 DIAGNOSIS — M25532 Pain in left wrist: Secondary | ICD-10-CM | POA: Diagnosis not present

## 2016-12-29 DIAGNOSIS — M654 Radial styloid tenosynovitis [de Quervain]: Secondary | ICD-10-CM | POA: Insufficient documentation

## 2016-12-29 DIAGNOSIS — M1711 Unilateral primary osteoarthritis, right knee: Secondary | ICD-10-CM | POA: Diagnosis not present

## 2016-12-29 NOTE — Patient Instructions (Signed)
Good to see you  Ice 20 minutes 2 times daily. Usually after activity and before bed. pennsaid pinkie amount topically 2 times daily as needed.  Wrist brace day and night for 1 week then nightly for 2 weeks.  Started orthovisc one time a week for next 3 weeks.  See you soon.

## 2016-12-29 NOTE — Assessment & Plan Note (Signed)
Patient was started on Orthovisc today with him failing all conservative therapy otherwise. Patient is not a surgical candidate for any type or replacement with his other comorbidities. We will continue with conservative therapy otherwise. Could consider custom bracing if necessary. Follow-up again in 1 week for second in a series of 4 injections.

## 2016-12-29 NOTE — Assessment & Plan Note (Signed)
Patient given injection today and tolerated the procedure well. We discussed icing regimen and home exercises, we discussed objective is to do a which ones to potentially avoid. Patient will increase activity slowly. Follow-up again in 4 weeks
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
## 2017-01-05 ENCOUNTER — Ambulatory Visit (INDEPENDENT_AMBULATORY_CARE_PROVIDER_SITE_OTHER): Payer: PPO | Admitting: Family Medicine

## 2017-01-05 ENCOUNTER — Encounter: Payer: Self-pay | Admitting: Family Medicine

## 2017-01-05 DIAGNOSIS — M1711 Unilateral primary osteoarthritis, right knee: Secondary | ICD-10-CM | POA: Diagnosis not present

## 2017-01-05 NOTE — Progress Notes (Signed)
Pre-visit discussion using our clinic review tool. No additional management support is needed unless otherwise documented below in the visit note.  

## 2017-01-05 NOTE — Assessment & Plan Note (Signed)
Second in a series of 4 injections given today. Patient is to see me again in 1 week for third in a series of 4 injections. Continue conservative therapy.

## 2017-01-05 NOTE — Patient Instructions (Signed)
Down and 1 to go  Ice is your friend.  See me again in 1 week for #3

## 2017-01-08 ENCOUNTER — Other Ambulatory Visit: Payer: Self-pay | Admitting: Family Medicine

## 2017-01-12 ENCOUNTER — Encounter: Payer: Self-pay | Admitting: Family Medicine

## 2017-01-12 ENCOUNTER — Ambulatory Visit (INDEPENDENT_AMBULATORY_CARE_PROVIDER_SITE_OTHER): Payer: PPO | Admitting: Family Medicine

## 2017-01-12 DIAGNOSIS — M1711 Unilateral primary osteoarthritis, right knee: Secondary | ICD-10-CM

## 2017-01-12 NOTE — Progress Notes (Signed)
Pre-visit discussion using our clinic review tool. No additional management support is needed unless otherwise documented below in the visit note.  

## 2017-01-12 NOTE — Assessment & Plan Note (Signed)
3 injections down 4 injections series. Follow up in 1 week for fourth injection. Continue conservative therapy otherwise

## 2017-01-12 NOTE — Patient Instructions (Signed)
God to see you  3 down and 1 to go  Stay active.  See you soon!
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
## 2017-01-14 DIAGNOSIS — C8400 Mycosis fungoides, unspecified site: Secondary | ICD-10-CM | POA: Diagnosis not present

## 2017-01-14 DIAGNOSIS — Z79899 Other long term (current) drug therapy: Secondary | ICD-10-CM | POA: Diagnosis not present

## 2017-01-19 ENCOUNTER — Encounter: Payer: Self-pay | Admitting: Family Medicine

## 2017-01-19 ENCOUNTER — Ambulatory Visit (INDEPENDENT_AMBULATORY_CARE_PROVIDER_SITE_OTHER): Payer: PPO | Admitting: Family Medicine

## 2017-01-19 DIAGNOSIS — M1711 Unilateral primary osteoarthritis, right knee: Secondary | ICD-10-CM | POA: Diagnosis not present

## 2017-01-19 NOTE — Assessment & Plan Note (Signed)
Fourth and final injection given today. Tolerated the procedure well. We discussed icing regimen and home exercises. We discussed which activities to do a which ones to avoid. Patient will follow-up with me again in 4 weeks for further evaluation.

## 2017-01-19 NOTE — Progress Notes (Signed)
Pre-visit discussion using our clinic review tool. No additional management support is needed unless otherwise documented below in the visit note.  
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
## 2017-02-06 ENCOUNTER — Other Ambulatory Visit: Payer: Self-pay | Admitting: Family Medicine

## 2017-02-09 ENCOUNTER — Other Ambulatory Visit: Payer: Self-pay | Admitting: Cardiovascular Disease

## 2017-02-10 ENCOUNTER — Encounter: Payer: Self-pay | Admitting: Internal Medicine

## 2017-02-10 ENCOUNTER — Ambulatory Visit (INDEPENDENT_AMBULATORY_CARE_PROVIDER_SITE_OTHER): Payer: PPO | Admitting: Internal Medicine

## 2017-02-10 VITALS — BP 138/82 | HR 103 | Ht 68.5 in | Wt 237.0 lb

## 2017-02-10 DIAGNOSIS — IMO0002 Reserved for concepts with insufficient information to code with codable children: Secondary | ICD-10-CM

## 2017-02-10 DIAGNOSIS — E1165 Type 2 diabetes mellitus with hyperglycemia: Secondary | ICD-10-CM | POA: Diagnosis not present

## 2017-02-10 DIAGNOSIS — E1142 Type 2 diabetes mellitus with diabetic polyneuropathy: Secondary | ICD-10-CM

## 2017-02-10 LAB — POCT GLYCOSYLATED HEMOGLOBIN (HGB A1C): HEMOGLOBIN A1C: 7
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
## 2017-02-10 NOTE — Telephone Encounter (Signed)
REFILL 

## 2017-02-10 NOTE — Addendum Note (Signed)
Addended by: Caprice Beaver T on: 02/10/2017 11:10 AM   Modules accepted: Orders

## 2017-02-10 NOTE — Patient Instructions (Addendum)
Please continue: - Levemir 50 units 2x a day - NovoLog 34-40 units 2x a dayg. - Trulicity 1.5 mg weekly in am. - Metformin 1000 mg 2x a day with meals.  Try to sweeten your tea with Stevia.  Please return in 3 months with your sugar log.

## 2017-02-16 DIAGNOSIS — C8400 Mycosis fungoides, unspecified site: Secondary | ICD-10-CM | POA: Diagnosis not present

## 2017-02-16 DIAGNOSIS — C8409 Mycosis fungoides, extranodal and solid organ sites: Secondary | ICD-10-CM | POA: Diagnosis not present

## 2017-03-08 IMAGING — CR DG FOOT 2V*L*
2 series · 2 of 2 positions shown · non-contrast
Comparison: None.

CLINICAL DATA: Pain and swelling left great toe. Diabetic patient
with history of ingrown toenail removal.

EXAM:
LEFT FOOT - 2 VIEW

[foot ap]
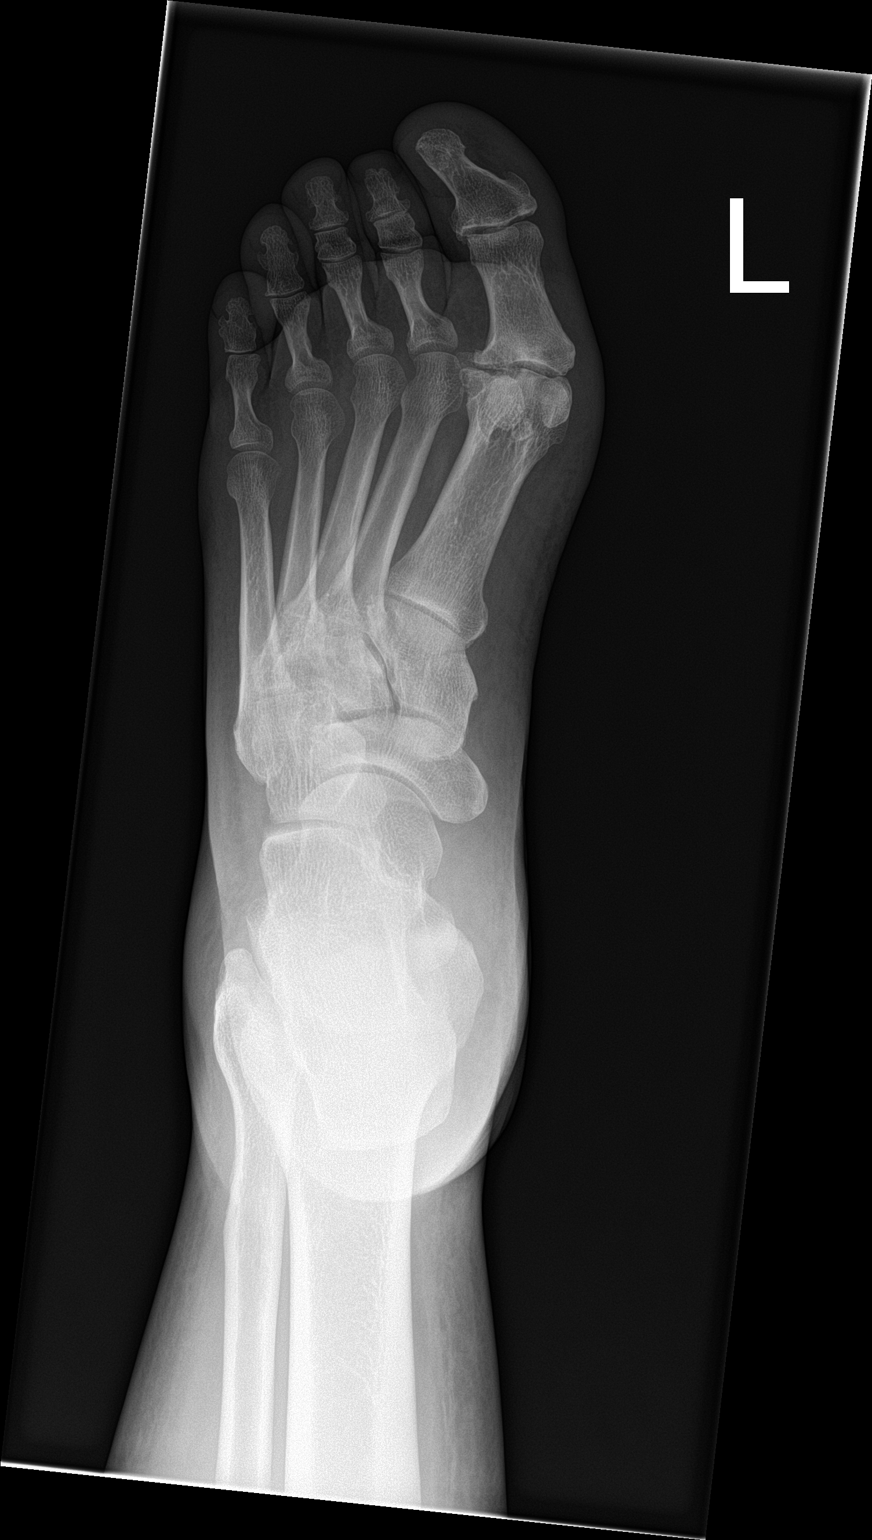

[foot lat]
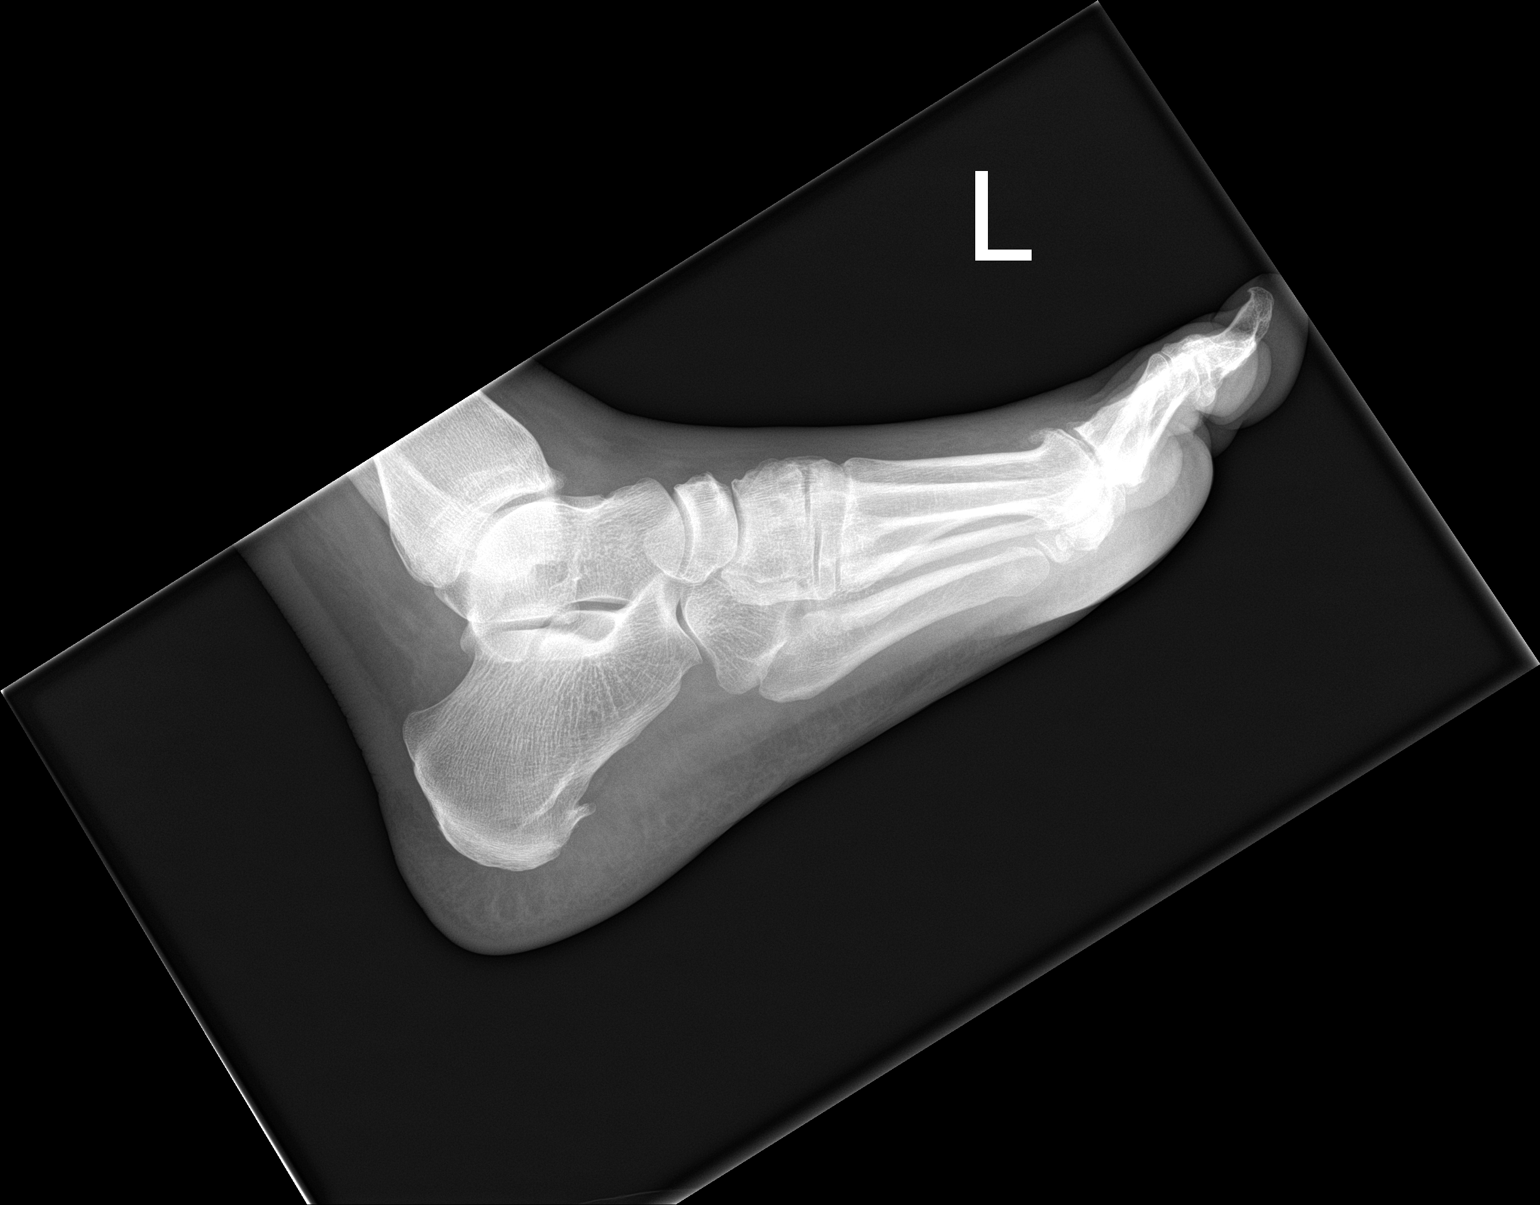

[2 of 2 positions shown; findings below may reference images not displayed]

FINDINGS: No osseous destructive change to suggest osteomyelitis. No erosion
or periosteal reaction. There is moderate osteoarthritis of the
first metatarsal phalangeal joint. Hallux valgus with medial soft
tissue prominence at the first metatarsal phalangeal joint
suggesting bunion. No fracture or dislocation. No radiopaque foreign
body. Plantar calcaneal spur is noted.
IMPRESSION: 1. No acute osseous abnormality radiographic findings of
osteomyelitis.
2. Hallux valgus with osteoarthritis of the first metatarsal
phalangeal joint medial soft tissue prominence, may reflect bunion.

## 2017-03-15 DIAGNOSIS — C8400 Mycosis fungoides, unspecified site: Secondary | ICD-10-CM | POA: Diagnosis not present

## 2017-03-23 ENCOUNTER — Ambulatory Visit (INDEPENDENT_AMBULATORY_CARE_PROVIDER_SITE_OTHER): Payer: PPO | Admitting: Family Medicine

## 2017-03-23 ENCOUNTER — Encounter: Payer: Self-pay | Admitting: Family Medicine

## 2017-03-23 VITALS — BP 130/80 | HR 100 | Temp 98.4°F | Wt 230.0 lb

## 2017-03-23 DIAGNOSIS — C8408 Mycosis fungoides, lymph nodes of multiple sites: Secondary | ICD-10-CM

## 2017-03-23 DIAGNOSIS — I10 Essential (primary) hypertension: Secondary | ICD-10-CM

## 2017-03-23 DIAGNOSIS — R590 Localized enlarged lymph nodes: Secondary | ICD-10-CM | POA: Diagnosis not present

## 2017-03-23 DIAGNOSIS — I739 Peripheral vascular disease, unspecified: Secondary | ICD-10-CM | POA: Diagnosis not present
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
## 2017-03-23 NOTE — Patient Instructions (Signed)
I believe you likely have large left inguinal node and recommend prompt evaluation at [facility] by your Lymphoma specialist.

## 2017-04-03 ENCOUNTER — Other Ambulatory Visit: Payer: Self-pay | Admitting: Family Medicine

## 2017-04-03 ENCOUNTER — Other Ambulatory Visit: Payer: Self-pay | Admitting: Internal Medicine

## 2017-04-06 DIAGNOSIS — C8400 Mycosis fungoides, unspecified site: Secondary | ICD-10-CM | POA: Diagnosis not present

## 2017-04-06 DIAGNOSIS — Z79899 Other long term (current) drug therapy: Secondary | ICD-10-CM | POA: Diagnosis not present

## 2017-04-06 DIAGNOSIS — J8410 Pulmonary fibrosis, unspecified: Secondary | ICD-10-CM | POA: Diagnosis not present

## 2017-04-06 DIAGNOSIS — R21 Rash and other nonspecific skin eruption: Secondary | ICD-10-CM | POA: Diagnosis not present

## 2017-04-06 DIAGNOSIS — K760 Fatty (change of) liver, not elsewhere classified: Secondary | ICD-10-CM | POA: Diagnosis not present

## 2017-04-06 DIAGNOSIS — E041 Nontoxic single thyroid nodule: Secondary | ICD-10-CM | POA: Diagnosis not present

## 2017-04-06 DIAGNOSIS — C84A0 Cutaneous T-cell lymphoma, unspecified, unspecified site: Secondary | ICD-10-CM | POA: Diagnosis not present

## 2017-04-06 DIAGNOSIS — C84A9 Cutaneous T-cell lymphoma, unspecified, extranodal and solid organ sites: Secondary | ICD-10-CM | POA: Diagnosis not present

## 2017-04-06 DIAGNOSIS — C8409 Mycosis fungoides, extranodal and solid organ sites: Secondary | ICD-10-CM | POA: Diagnosis not present

## 2017-04-06 DIAGNOSIS — C8590 Non-Hodgkin lymphoma, unspecified, unspecified site: Secondary | ICD-10-CM | POA: Diagnosis not present

## 2017-04-28 DIAGNOSIS — Z79899 Other long term (current) drug therapy: Secondary | ICD-10-CM | POA: Diagnosis not present

## 2017-04-28 DIAGNOSIS — R590 Localized enlarged lymph nodes: Secondary | ICD-10-CM | POA: Diagnosis not present

## 2017-04-28 DIAGNOSIS — C84A5 Cutaneous T-cell lymphoma, unspecified, lymph nodes of inguinal region and lower limb: Secondary | ICD-10-CM | POA: Diagnosis not present

## 2017-04-28 DIAGNOSIS — Z5181 Encounter for therapeutic drug level monitoring: Secondary | ICD-10-CM | POA: Diagnosis not present

## 2017-05-03 DIAGNOSIS — C8400 Mycosis fungoides, unspecified site: Secondary | ICD-10-CM | POA: Diagnosis not present

## 2017-05-12 ENCOUNTER — Telehealth: Payer: Self-pay | Admitting: Internal Medicine

## 2017-05-12 DIAGNOSIS — C8400 Mycosis fungoides, unspecified site: Secondary | ICD-10-CM | POA: Diagnosis not present

## 2017-05-12 NOTE — Telephone Encounter (Signed)
MEDICATION: NEEDS METER, ONETOUCH VERIO test strip  PHARMACY:  [facility] [place], [place] [phone] (Phone) [phone] (Fax)   IS THIS A 90 DAY SUPPLY : yes  IS PATIENT OUT OF MEDICTAION: yes   IF NOT; HOW MUCH IS LEFT: n/a  LAST APPOINTMENT DATE:02/10/17  NEXT APPOINTMENT DATE:06/01/17  OTHER COMMENTS:  Has questions about the new butterfly meter  **Let patient know to contact pharmacy at the end of the day to make sure medication is ready. **  ** Please notify patient to allow 48-72 hours to process**  **Encourage patient to contact the pharmacy for refills or they can request refills through [facility]**

## 2017-05-13 ENCOUNTER — Other Ambulatory Visit: Payer: Self-pay

## 2017-05-13 DIAGNOSIS — C8405 Mycosis fungoides, lymph nodes of inguinal region and lower limb: Secondary | ICD-10-CM | POA: Diagnosis not present

## 2017-05-13 DIAGNOSIS — C8400 Mycosis fungoides, unspecified site: Secondary | ICD-10-CM | POA: Diagnosis not present

## 2017-05-13 MED ORDER — GLUCOSE BLOOD VI STRP: ORAL_STRIP | 3 refills | Status: DC

## 2017-05-13 NOTE — Telephone Encounter (Signed)
Submitted

## 2017-05-14 DIAGNOSIS — C8405 Mycosis fungoides, lymph nodes of inguinal region and lower limb: Secondary | ICD-10-CM | POA: Diagnosis not present

## 2017-05-18 DIAGNOSIS — I071 Rheumatic tricuspid insufficiency: Secondary | ICD-10-CM | POA: Diagnosis not present

## 2017-05-18 DIAGNOSIS — C8409 Mycosis fungoides, extranodal and solid organ sites: Secondary | ICD-10-CM | POA: Diagnosis not present

## 2017-05-18 DIAGNOSIS — Z5181 Encounter for therapeutic drug level monitoring: Secondary | ICD-10-CM | POA: Diagnosis not present

## 2017-05-18 DIAGNOSIS — C8400 Mycosis fungoides, unspecified site: Secondary | ICD-10-CM | POA: Diagnosis not present

## 2017-05-19 ENCOUNTER — Telehealth: Payer: Self-pay | Admitting: Internal Medicine

## 2017-05-19 ENCOUNTER — Other Ambulatory Visit: Payer: Self-pay

## 2017-05-19 DIAGNOSIS — C8409 Mycosis fungoides, extranodal and solid organ sites: Secondary | ICD-10-CM | POA: Diagnosis not present

## 2017-05-19 MED ORDER — GLUCOSE BLOOD VI STRP: ORAL_STRIP | 3 refills | Status: DC

## 2017-05-19 NOTE — Telephone Encounter (Signed)
Patient called in reference to needing glucose blood (ONETOUCH VERIO) test strip, ONE Touch meter, as well as the pens.    [facility] [place], [place] [address] [place] [phone] (Phone) [phone] (Fax)   Patient also stated he is going to start chemo at [facility] in the next few weeks and wanted to know if he needed to be seen prior to this. Please call patient and advise.

## 2017-05-19 NOTE — Telephone Encounter (Signed)
Please advise of question at the end. I have submitted the refill for the test strips. Thank you.

## 2017-05-20 ENCOUNTER — Telehealth: Payer: Self-pay

## 2017-05-20 DIAGNOSIS — C8409 Mycosis fungoides, extranodal and solid organ sites: Secondary | ICD-10-CM | POA: Diagnosis not present

## 2017-05-20 MED ORDER — INSULIN ASPART 100 UNIT/ML FLEXPEN: PEN_INJECTOR | SUBCUTANEOUS | 5 refills | Status: DC

## 2017-05-20 MED ORDER — INSULIN DETEMIR 100 UNIT/ML FLEXPEN: 50.0000 [IU] | PEN_INJECTOR | Freq: Two times a day (BID) | SUBCUTANEOUS | 3 refills | Status: DC

## 2017-05-20 NOTE — Telephone Encounter (Signed)
Called and notified patient of MD note. Submitted in RX. Gave call back number if any questions.

## 2017-05-20 NOTE — Telephone Encounter (Signed)
No, I will see him on 08/28. Let us know if sugars increase since then.

## 2017-05-20 NOTE — Telephone Encounter (Signed)
Called and notified patient of MD note. Left call back number if any questions.

## 2017-05-21 DIAGNOSIS — C8409 Mycosis fungoides, extranodal and solid organ sites: Secondary | ICD-10-CM | POA: Diagnosis not present

## 2017-05-24 DIAGNOSIS — C8409 Mycosis fungoides, extranodal and solid organ sites: Secondary | ICD-10-CM | POA: Diagnosis not present

## 2017-05-25 DIAGNOSIS — C8409 Mycosis fungoides, extranodal and solid organ sites: Secondary | ICD-10-CM | POA: Diagnosis not present

## 2017-05-26 DIAGNOSIS — C8409 Mycosis fungoides, extranodal and solid organ sites: Secondary | ICD-10-CM | POA: Diagnosis not present

## 2017-05-26 DIAGNOSIS — Z5111 Encounter for antineoplastic chemotherapy: Secondary | ICD-10-CM | POA: Diagnosis not present

## 2017-05-27 DIAGNOSIS — C8409 Mycosis fungoides, extranodal and solid organ sites: Secondary | ICD-10-CM | POA: Diagnosis not present

## 2017-05-28 DIAGNOSIS — C8409 Mycosis fungoides, extranodal and solid organ sites: Secondary | ICD-10-CM | POA: Diagnosis not present

## 2017-05-31 DIAGNOSIS — R350 Frequency of micturition: Secondary | ICD-10-CM | POA: Diagnosis not present

## 2017-05-31 DIAGNOSIS — C8409 Mycosis fungoides, extranodal and solid organ sites: Secondary | ICD-10-CM | POA: Diagnosis not present

## 2017-06-01 ENCOUNTER — Ambulatory Visit: Payer: PPO | Admitting: Internal Medicine

## 2017-06-01 DIAGNOSIS — C8409 Mycosis fungoides, extranodal and solid organ sites: Secondary | ICD-10-CM | POA: Diagnosis not present

## 2017-06-02 DIAGNOSIS — C8409 Mycosis fungoides, extranodal and solid organ sites: Secondary | ICD-10-CM | POA: Diagnosis not present

## 2017-06-03 ENCOUNTER — Encounter: Payer: Self-pay | Admitting: Internal Medicine

## 2017-06-03 ENCOUNTER — Ambulatory Visit (INDEPENDENT_AMBULATORY_CARE_PROVIDER_SITE_OTHER): Payer: PPO | Admitting: Internal Medicine

## 2017-06-03 VITALS — BP 122/74 | HR 112 | Ht 68.5 in | Wt 217.0 lb

## 2017-06-03 DIAGNOSIS — E1165 Type 2 diabetes mellitus with hyperglycemia: Secondary | ICD-10-CM | POA: Diagnosis not present

## 2017-06-03 DIAGNOSIS — E1142 Type 2 diabetes mellitus with diabetic polyneuropathy: Secondary | ICD-10-CM

## 2017-06-03 DIAGNOSIS — R634 Abnormal weight loss: Secondary | ICD-10-CM | POA: Diagnosis not present

## 2017-06-03 DIAGNOSIS — C8409 Mycosis fungoides, extranodal and solid organ sites: Secondary | ICD-10-CM | POA: Diagnosis not present

## 2017-06-03 DIAGNOSIS — IMO0002 Reserved for concepts with insufficient information to code with codable children: Secondary | ICD-10-CM

## 2017-06-03 LAB — POCT GLYCOSYLATED HEMOGLOBIN (HGB A1C): Hemoglobin A1C: 9.5

## 2017-06-03 MED ORDER — METFORMIN HCL 1000 MG PO TABS: 1000.0000 mg | ORAL_TABLET | Freq: Two times a day (BID) | ORAL | 3 refills | Status: DC

## 2017-06-03 MED ORDER — DULAGLUTIDE 1.5 MG/0.5ML ~~LOC~~ SOAJ: SUBCUTANEOUS | 3 refills | Status: DC

## 2017-06-03 MED ORDER — INSULIN ASPART 100 UNIT/ML FLEXPEN: PEN_INJECTOR | SUBCUTANEOUS | 5 refills | Status: DC
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
## 2017-06-03 NOTE — Patient Instructions (Addendum)
Please continue: - Levemir 50 units 2x a day - NovoLog 34-40 units 2x a day.  Please restart: - Trulicity 1.5 mg weekly in am. - Metformin 1000 mg 2x a day with meals.  Please return in 3 months with your sugar log.

## 2017-06-03 NOTE — Addendum Note (Signed)
Addended by: Verlin Grills T on: 06/03/2017 02:48 PM   Modules accepted: Orders

## 2017-06-04 ENCOUNTER — Encounter: Payer: Self-pay | Admitting: Cardiology

## 2017-06-04 ENCOUNTER — Ambulatory Visit (INDEPENDENT_AMBULATORY_CARE_PROVIDER_SITE_OTHER): Payer: PPO | Admitting: Cardiology

## 2017-06-04 VITALS — BP 133/80 | HR 113 | Ht 69.0 in | Wt 218.6 lb

## 2017-06-04 DIAGNOSIS — C8400 Mycosis fungoides, unspecified site: Secondary | ICD-10-CM | POA: Diagnosis not present

## 2017-06-04 DIAGNOSIS — I2510 Atherosclerotic heart disease of native coronary artery without angina pectoris: Secondary | ICD-10-CM

## 2017-06-04 DIAGNOSIS — Z89512 Acquired absence of left leg below knee: Secondary | ICD-10-CM | POA: Diagnosis not present

## 2017-06-04 DIAGNOSIS — E1159 Type 2 diabetes mellitus with other circulatory complications: Secondary | ICD-10-CM

## 2017-06-04 DIAGNOSIS — I428 Other cardiomyopathies: Secondary | ICD-10-CM

## 2017-06-04 DIAGNOSIS — E1165 Type 2 diabetes mellitus with hyperglycemia: Secondary | ICD-10-CM | POA: Diagnosis not present

## 2017-06-04 DIAGNOSIS — I10 Essential (primary) hypertension: Secondary | ICD-10-CM

## 2017-06-04 DIAGNOSIS — C8405 Mycosis fungoides, lymph nodes of inguinal region and lower limb: Secondary | ICD-10-CM | POA: Diagnosis not present

## 2017-06-04 DIAGNOSIS — Z95810 Presence of automatic (implantable) cardiac defibrillator: Secondary | ICD-10-CM | POA: Diagnosis not present

## 2017-06-04 MED ORDER — CARVEDILOL 25 MG PO TABS: 37.5000 mg | ORAL_TABLET | Freq: Two times a day (BID) | ORAL | 3 refills | Status: DC

## 2017-06-04 NOTE — Progress Notes (Signed)
06/04/2017 Eric Price   [birth year]  [id number]  Primary Physician Burchette, Alinda Sierras, MD Primary Cardiologist: Dr Berry-Dr Sallyanne Kuster (ICD)  HPI:  66 y/o obese, AA, male with a history of DM, s/p Lt BKA, HTN, NICM, and mycosis fungoides. He is being followed at [facility] and is being treated with chemotherapy and radiation. He has a history of NICM. His EF in May 2016 was 30-35%. F/U echo in Oct 2016 was unchanged and he underwent MDT ICD implant Nov 2016. Cath May 2016 showed 75% dRCA. He last saw Dr Sallyanne Kuster a year ago for a device check. He is in the office today for routine follow up (?) pt is not a great historian. He has recently started chemotherapy and radiation Rx at [facility]. An echo done 05/18/17 showed his EF has normalized- 55% with mild LVH. He has not had problems with chest discomfort and is not really aware of shortness of breath. He is obese and has untreated OSA. He says he is always has DOE.    Current Outpatient Prescriptions  Medication Sig Dispense Refill  . albuterol (PROVENTIL HFA;VENTOLIN HFA) 108 (90 Base) MCG/ACT inhaler Inhale 2 puffs into the lungs every 4 (four) hours as needed for wheezing or shortness of breath. 1 Inhaler 2  . atorvastatin (LIPITOR) 20 MG tablet TAKE 1 TABLET(20 MG) BY MOUTH DAILY AT 6 PM 90 tablet 1  . bexarotene (TARGRETIN) 75 MG CAPS capsule Take 150 mg/m2 by mouth daily with breakfast. Give with food. Protect from light. CAUTION: Chemotherapy/Biotherapy    . buPROPion (WELLBUTRIN XL) 150 MG 24 hr tablet TAKE 1 TABLET(150 MG) BY MOUTH DAILY 90 tablet 1  . carvedilol (COREG) 25 MG tablet Take 1.5 tablets (37.5 mg total) by mouth 2 (two) times daily with a meal. 135 tablet 3  . clobetasol cream (TEMOVATE) 8.33 % APPLY 1 APPLICATION TOPICALLY TWICE DAILY 30 g 2  . DOCQLACE 100 MG capsule TAKE 1 CAPSULE BY MOUTH DAILY (Patient taking differently: TAKE 1 CAPSULE BY MOUTH DAILY AS NEEDED) 30 capsule 0  . Dulaglutide (TRULICITY) 1.5 AS/5.0NL SOPN  INJECT CONTENTS OF ONE PEN UNDER THE SKIN ONCE A WEEK 12 pen 3  . ELIQUIS 5 MG TABS tablet TAKE 1 TABLET BY MOUTH TWICE DAILY 60 tablet 5  . fenofibrate 54 MG tablet Take 1 tablet (54 mg total) by mouth daily. Pt. Not instructed to take currently 30 tablet 11  . folic acid (FOLVITE) 1 MG tablet Take 1 tablet (1 mg total) by mouth daily.    Marland Kitchen gabapentin (NEURONTIN) 600 MG tablet Take 1 tablet (600 mg total) by mouth 4 (four) times daily. 360 tablet 1  . glimepiride (AMARYL) 2 MG tablet TAKE 1 TABLET(2 MG) BY MOUTH DAILY WITH BREAKFAST 90 tablet 0  . glucagon 1 MG injection Inject 1 mg into the vein 2 (two) times daily.    Marland Kitchen glucose blood (ONETOUCH VERIO) test strip USE TO CHECK BLOOD SUGAR TWICE DAILY 200 each 3  . hydrOXYzine (ATARAX/VISTARIL) 25 MG tablet TAKE 1- 3 TABLETS BY MOUTH TWICE DAILY AS NEEDED FOR ITCHING 180 tablet 0  . insulin aspart (NOVOLOG FLEXPEN) 100 UNIT/ML FlexPen Inject 34-40 units before meals, 3x a day 30 mL 5  . Insulin Detemir (LEVEMIR) 100 UNIT/ML Pen Inject 50 Units into the skin 2 (two) times daily. 30 mL 3  . lisinopril (PRINIVIL,ZESTRIL) 2.5 MG tablet Take 2 tablets (5 mg total) by mouth daily. 180 tablet 3  . metFORMIN (GLUCOPHAGE) 1000 MG tablet  Take 1 tablet (1,000 mg total) by mouth 2 (two) times daily with a meal. 180 tablet 3  . methotrexate (RHEUMATREX) 2.5 MG tablet Use as directed per [facility]. Titrated up eventually to 6 per day. 4 tablet 0  . nitroGLYCERIN (NITROSTAT) 0.4 MG SL tablet Place 1 tablet (0.4 mg total) under the tongue every 5 (five) minutes as needed for chest pain. 60 tablet 12  . ondansetron (ZOFRAN) 8 MG tablet   3  . ONE TOUCH LANCETS MISC Check 2 times daily. 100 each 3  . pantoprazole (PROTONIX) 40 MG tablet Take 1 tablet (40 mg total) by mouth daily. 30 tablet 1  . promethazine (PHENERGAN) 12.5 MG tablet Take 12.5 mg by mouth every 6 (six) hours as needed for nausea or vomiting.    Marland Kitchen tiZANidine (ZANAFLEX) 2 MG tablet TAKE 1 TABLET(2 MG) BY  MOUTH AT BEDTIME AS NEEDED FOR MUSCLE SPASMS 90 tablet 1  . triamcinolone cream (KENALOG) 0.1 % Apply 1 application topically 2 (two) times daily.     . furosemide (LASIX) 40 MG tablet Take 1 tablet (40 mg total) by mouth daily. 30 tablet 11   No current facility-administered medications for this visit.     Allergies  Allergen Reactions  . Morphine Shortness Of Breath and Anaphylaxis  . Morphine And Related     "took my breath away"    Past Medical History:  Diagnosis Date  . Arthritis    knees, shoulder, hands   . Chronic combined systolic and diastolic CHF (congestive heart failure) ([facility]) 07/17/2016  . Coronary artery disease   . Critical lower limb ischemia   . Depression   . Hyperlipidemia   . Hypertension   . Mycosis fungoides ([facility])    ALK negative; TCR positive; CD30 positive, CD3 positive.   . Nonischemic cardiomyopathy ([facility])   . Peripheral vascular disease ([facility])   . Pneumonia 2013   hosp. - MCH x1 week   . Shortness of breath dyspnea    related to pain currently  . Sleep apnea    10-20 yrs. ago, states he used CPAP, not needed anymore.   . Type II diabetes mellitus ([facility])     Social History   Social History  . Marital status: Married    Spouse name: N/A  . Number of children: 2  . 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 Smoking status: Never Smoker  . Smokeless tobacco: Never Used  . Alcohol use No  . Drug use: No  . Sexual activity: Not Currently   Other Topics Concern  . Not on file   Social History Narrative   Lives with wife.      Family History  Problem Relation Age of Onset  . CAD Father 48       Died 78  . Hypertension Father   . Heart failure Mother 72  . Diabetes Maternal Grandmother   . CAD Paternal Grandfather 9  . Colon cancer Neg Hx   . Esophageal cancer Neg Hx   . Stomach cancer Neg Hx   . Rectal cancer Neg Hx      Review of Systems: General: negative for  chills, fever, night sweats or weight changes.  Cardiovascular: negative for chest pain, dyspnea on exertion, edema, orthopnea, palpitations, paroxysmal nocturnal dyspnea or shortness of breath Dermatological: negative for rash Respiratory: negative for cough or wheezing Urologic: negative for hematuria Abdominal:  negative for nausea, vomiting, diarrhea, bright red blood per rectum, melena, or hematemesis Neurologic: negative for visual changes, syncope, or dizziness All other systems reviewed and are otherwise negative except as noted above.    Blood pressure 133/80, pulse (!) 113, height _0  (1.753 m), weight 218 lb 9.6 oz (99.2 kg).  General appearance: alert, cooperative, no distress and moderately obese Neck: no carotid bruit and no JVD Lungs: clear to auscultation bilaterally Heart: regular rate and rhythm Abdomen: obese, non tender Neurologic: Grossly normal  EKG NSR, ST 113  ASSESSMENT AND PLAN:   NICM (nonischemic cardiomyopathy) (HCC) Nonischemic cardiomyopathy- EF 30-35% May 2016, no change on medical Rx Oct 2016 Most recent echo from [facility] 05/18/17- EF 55%  CAD (coronary artery disease) Minor CAD- 75% dRCA 2016  ICD (implantable cardioverter-defibrillator) in place MDT ICD Nov 2016- Dr Croitoru  Mycosis fungoides Being treated with chenotherapy and radiation Rx at [facility]  History of left below knee amputation [facility]) S/p Lt BKA may 2016  Poorly controlled type 2 diabetes mellitus with circulatory disorder ([place]) Followed by PCP. Recent SCr at [facility] 1.0-1.3   PLAN  I suggested he increase his Coreg to 37.5 mg BID. There has been mention of medication compliance issues in the past but he assures me he is taking his medication. His wife confirmed this. He has a f/u with Dr Sallyanne Kuster in Oct.   Kerin Ransom PA-C 06/04/2017 11:44 AM

## 2017-06-04 NOTE — Assessment & Plan Note (Signed)
Nonischemic cardiomyopathy- EF 30-35% May 2016, no change on medical Rx Oct 2016 Most recent echo from [facility] 05/18/17- EF 55%

## 2017-06-04 NOTE — Assessment & Plan Note (Signed)
MDT ICD Nov 2016- Dr Sallyanne Kuster

## 2017-06-04 NOTE — Assessment & Plan Note (Addendum)
Followed by PCP. Recent SCr at [facility] 1.0-1.3

## 2017-06-04 NOTE — Assessment & Plan Note (Signed)
S/p Lt BKA may 2016

## 2017-06-04 NOTE — Assessment & Plan Note (Signed)
Being treated with chenotherapy and radiation Rx at [facility]

## 2017-06-04 NOTE — Patient Instructions (Signed)
Medication Instructions: INCREASE the Carvedilol to 37.5 mg (one and half tablets of the 25 mg tablet) twice daily.  If you need a refill on your cardiac medications before your next appointment, please call your pharmacy.   Follow-Up: Your physician wants you to follow-up in October with Dr. Sallyanne Kuster. You will receive a reminder letter in the mail two months in advance. If you don't receive a letter, please call our office to schedule this follow-up appointment.    Thank you for choosing Heartcare at [facility]!!

## 2017-06-04 NOTE — Assessment & Plan Note (Signed)
Minor CAD- 75% dRCA 2016

## 2017-06-08 DIAGNOSIS — C8400 Mycosis fungoides, unspecified site: Secondary | ICD-10-CM | POA: Diagnosis not present

## 2017-06-08 DIAGNOSIS — R509 Fever, unspecified: Secondary | ICD-10-CM | POA: Diagnosis not present

## 2017-06-09 ENCOUNTER — Ambulatory Visit: Payer: PPO | Admitting: Physician Assistant

## 2017-06-09 DIAGNOSIS — C84A4 Cutaneous T-cell lymphoma, unspecified, lymph nodes of axilla and upper limb: Secondary | ICD-10-CM | POA: Diagnosis not present

## 2017-06-09 DIAGNOSIS — I2510 Atherosclerotic heart disease of native coronary artery without angina pectoris: Secondary | ICD-10-CM | POA: Diagnosis not present

## 2017-06-09 DIAGNOSIS — D6859 Other primary thrombophilia: Secondary | ICD-10-CM | POA: Diagnosis not present

## 2017-06-09 DIAGNOSIS — R591 Generalized enlarged lymph nodes: Secondary | ICD-10-CM | POA: Diagnosis not present

## 2017-06-09 DIAGNOSIS — I429 Cardiomyopathy, unspecified: Secondary | ICD-10-CM | POA: Diagnosis not present

## 2017-06-09 DIAGNOSIS — I2699 Other pulmonary embolism without acute cor pulmonale: Secondary | ICD-10-CM | POA: Diagnosis not present

## 2017-06-09 DIAGNOSIS — I110 Hypertensive heart disease with heart failure: Secondary | ICD-10-CM | POA: Diagnosis not present

## 2017-06-09 DIAGNOSIS — I10 Essential (primary) hypertension: Secondary | ICD-10-CM | POA: Diagnosis not present

## 2017-06-09 DIAGNOSIS — T380X5A Adverse effect of glucocorticoids and synthetic analogues, initial encounter: Secondary | ICD-10-CM | POA: Diagnosis not present

## 2017-06-09 DIAGNOSIS — C8404 Mycosis fungoides, lymph nodes of axilla and upper limb: Secondary | ICD-10-CM | POA: Diagnosis not present

## 2017-06-09 DIAGNOSIS — R161 Splenomegaly, not elsewhere classified: Secondary | ICD-10-CM | POA: Diagnosis not present

## 2017-06-09 DIAGNOSIS — R509 Fever, unspecified: Secondary | ICD-10-CM | POA: Diagnosis not present

## 2017-06-09 DIAGNOSIS — M7989 Other specified soft tissue disorders: Secondary | ICD-10-CM | POA: Diagnosis not present

## 2017-06-09 DIAGNOSIS — Z136 Encounter for screening for cardiovascular disorders: Secondary | ICD-10-CM | POA: Diagnosis not present

## 2017-06-09 DIAGNOSIS — R599 Enlarged lymph nodes, unspecified: Secondary | ICD-10-CM | POA: Diagnosis not present

## 2017-06-09 DIAGNOSIS — R000 Tachycardia, unspecified: Secondary | ICD-10-CM | POA: Diagnosis not present

## 2017-06-09 DIAGNOSIS — C8408 Mycosis fungoides, lymph nodes of multiple sites: Secondary | ICD-10-CM | POA: Diagnosis not present

## 2017-06-09 DIAGNOSIS — R404 Transient alteration of awareness: Secondary | ICD-10-CM | POA: Diagnosis not present

## 2017-06-09 DIAGNOSIS — C8590 Non-Hodgkin lymphoma, unspecified, unspecified site: Secondary | ICD-10-CM | POA: Diagnosis not present

## 2017-06-09 DIAGNOSIS — E875 Hyperkalemia: Secondary | ICD-10-CM | POA: Diagnosis not present

## 2017-06-09 DIAGNOSIS — R1312 Dysphagia, oropharyngeal phase: Secondary | ICD-10-CM | POA: Diagnosis not present

## 2017-06-09 DIAGNOSIS — R531 Weakness: Secondary | ICD-10-CM | POA: Diagnosis not present

## 2017-06-09 DIAGNOSIS — E099 Drug or chemical induced diabetes mellitus without complications: Secondary | ICD-10-CM | POA: Diagnosis not present

## 2017-06-09 DIAGNOSIS — C8400 Mycosis fungoides, unspecified site: Secondary | ICD-10-CM | POA: Diagnosis not present

## 2017-06-09 DIAGNOSIS — E119 Type 2 diabetes mellitus without complications: Secondary | ICD-10-CM | POA: Diagnosis not present

## 2017-06-09 DIAGNOSIS — R69 Illness, unspecified: Secondary | ICD-10-CM | POA: Diagnosis not present

## 2017-06-09 DIAGNOSIS — R140 Abdominal distension (gaseous): Secondary | ICD-10-CM | POA: Diagnosis not present

## 2017-06-09 DIAGNOSIS — B3781 Candidal esophagitis: Secondary | ICD-10-CM | POA: Diagnosis not present

## 2017-06-09 DIAGNOSIS — Z862 Personal history of diseases of the blood and blood-forming organs and certain disorders involving the immune mechanism: Secondary | ICD-10-CM | POA: Diagnosis not present

## 2017-06-09 DIAGNOSIS — R590 Localized enlarged lymph nodes: Secondary | ICD-10-CM | POA: Diagnosis not present

## 2017-06-09 DIAGNOSIS — E883 Tumor lysis syndrome: Secondary | ICD-10-CM | POA: Diagnosis not present

## 2017-06-09 DIAGNOSIS — R221 Localized swelling, mass and lump, neck: Secondary | ICD-10-CM | POA: Diagnosis not present

## 2017-06-09 DIAGNOSIS — N179 Acute kidney failure, unspecified: Secondary | ICD-10-CM | POA: Diagnosis not present

## 2017-06-09 DIAGNOSIS — I509 Heart failure, unspecified: Secondary | ICD-10-CM | POA: Diagnosis not present

## 2017-06-09 DIAGNOSIS — B370 Candidal stomatitis: Secondary | ICD-10-CM | POA: Diagnosis not present

## 2017-06-09 DIAGNOSIS — Z95810 Presence of automatic (implantable) cardiac defibrillator: Secondary | ICD-10-CM | POA: Diagnosis not present

## 2017-06-10 ENCOUNTER — Telehealth: Payer: Self-pay | Admitting: Family Medicine

## 2017-06-10 DIAGNOSIS — Z89512 Acquired absence of left leg below knee: Secondary | ICD-10-CM

## 2017-06-10 NOTE — Telephone Encounter (Signed)
Pt needs replacement socks, light and heavy for prosthetic leg. Also needs the rubber that goes next to the skin.  [employer]  Pt states dr Elease Hashimoto has ordered for him before.

## 2017-06-11 NOTE — Telephone Encounter (Signed)
Left message on machine for patient to return our call for phone number or possible order form from [place].

## 2017-06-11 NOTE — Telephone Encounter (Signed)
Ok to order 

## 2017-06-11 NOTE — Telephone Encounter (Signed)
OK 

## 2017-06-11 NOTE — Telephone Encounter (Signed)
Patient called and provided phone number to [employer] [phone]

## 2017-06-11 NOTE — Telephone Encounter (Signed)
Pt wife is aware may take up to 3 business day. Pt wife would like to get the supplies today

## 2017-06-15 NOTE — Telephone Encounter (Signed)
Order faxed and confirmed.

## 2017-06-20 DIAGNOSIS — E099 Drug or chemical induced diabetes mellitus without complications: Secondary | ICD-10-CM | POA: Insufficient documentation

## 2017-06-20 DIAGNOSIS — T380X5A Adverse effect of glucocorticoids and synthetic analogues, initial encounter: Secondary | ICD-10-CM

## 2017-06-22 ENCOUNTER — Ambulatory Visit: Payer: PPO | Admitting: Physician Assistant

## 2017-06-24 ENCOUNTER — Encounter: Payer: Self-pay | Admitting: Family Medicine

## 2017-06-24 DIAGNOSIS — K5903 Drug induced constipation: Secondary | ICD-10-CM | POA: Insufficient documentation

## 2017-06-24 DIAGNOSIS — B370 Candidal stomatitis: Secondary | ICD-10-CM | POA: Insufficient documentation

## 2017-06-28 DIAGNOSIS — C8408 Mycosis fungoides, lymph nodes of multiple sites: Secondary | ICD-10-CM | POA: Diagnosis not present

## 2017-06-30 ENCOUNTER — Other Ambulatory Visit: Payer: Self-pay | Admitting: Family Medicine

## 2017-07-02 DIAGNOSIS — E162 Hypoglycemia, unspecified: Secondary | ICD-10-CM | POA: Diagnosis not present

## 2017-07-02 DIAGNOSIS — E161 Other hypoglycemia: Secondary | ICD-10-CM | POA: Diagnosis not present

## 2017-07-04 ENCOUNTER — Emergency Department (HOSPITAL_COMMUNITY): Payer: Medicare HMO

## 2017-07-04 ENCOUNTER — Encounter (HOSPITAL_COMMUNITY): Payer: Self-pay | Admitting: Emergency Medicine

## 2017-07-04 ENCOUNTER — Emergency Department (HOSPITAL_COMMUNITY)
Admission: EM | Admit: 2017-07-04 | Discharge: 2017-07-04 | Disposition: A | Discharge status: Other Acute Inpatient Hospital | Payer: Medicare HMO | Attending: Emergency Medicine | Admitting: Emergency Medicine

## 2017-07-04 DIAGNOSIS — D708 Other neutropenia: Secondary | ICD-10-CM | POA: Diagnosis not present

## 2017-07-04 DIAGNOSIS — Z794 Long term (current) use of insulin: Secondary | ICD-10-CM | POA: Diagnosis not present

## 2017-07-04 DIAGNOSIS — Z89512 Acquired absence of left leg below knee: Secondary | ICD-10-CM | POA: Diagnosis not present

## 2017-07-04 DIAGNOSIS — Z95810 Presence of automatic (implantable) cardiac defibrillator: Secondary | ICD-10-CM | POA: Insufficient documentation

## 2017-07-04 DIAGNOSIS — I2510 Atherosclerotic heart disease of native coronary artery without angina pectoris: Secondary | ICD-10-CM | POA: Insufficient documentation

## 2017-07-04 DIAGNOSIS — C140 Malignant neoplasm of pharynx, unspecified: Secondary | ICD-10-CM | POA: Insufficient documentation

## 2017-07-04 DIAGNOSIS — Z23 Encounter for immunization: Secondary | ICD-10-CM | POA: Diagnosis not present

## 2017-07-04 DIAGNOSIS — E119 Type 2 diabetes mellitus without complications: Secondary | ICD-10-CM | POA: Diagnosis not present

## 2017-07-04 DIAGNOSIS — I429 Cardiomyopathy, unspecified: Secondary | ICD-10-CM | POA: Diagnosis not present

## 2017-07-04 DIAGNOSIS — Z79899 Other long term (current) drug therapy: Secondary | ICD-10-CM | POA: Diagnosis not present

## 2017-07-04 DIAGNOSIS — R1310 Dysphagia, unspecified: Secondary | ICD-10-CM

## 2017-07-04 DIAGNOSIS — C8409 Mycosis fungoides, extranodal and solid organ sites: Secondary | ICD-10-CM | POA: Diagnosis not present

## 2017-07-04 DIAGNOSIS — R7881 Bacteremia: Secondary | ICD-10-CM | POA: Diagnosis not present

## 2017-07-04 DIAGNOSIS — R9431 Abnormal electrocardiogram [ECG] [EKG]: Secondary | ICD-10-CM | POA: Diagnosis not present

## 2017-07-04 DIAGNOSIS — I5040 Unspecified combined systolic (congestive) and diastolic (congestive) heart failure: Secondary | ICD-10-CM | POA: Diagnosis not present

## 2017-07-04 DIAGNOSIS — T451X5A Adverse effect of antineoplastic and immunosuppressive drugs, initial encounter: Secondary | ICD-10-CM | POA: Insufficient documentation

## 2017-07-04 DIAGNOSIS — R5081 Fever presenting with conditions classified elsewhere: Secondary | ICD-10-CM

## 2017-07-04 DIAGNOSIS — K1231 Oral mucositis (ulcerative) due to antineoplastic therapy: Secondary | ICD-10-CM | POA: Insufficient documentation

## 2017-07-04 DIAGNOSIS — I110 Hypertensive heart disease with heart failure: Secondary | ICD-10-CM | POA: Insufficient documentation

## 2017-07-04 DIAGNOSIS — R070 Pain in throat: Secondary | ICD-10-CM | POA: Diagnosis present

## 2017-07-04 DIAGNOSIS — I5042 Chronic combined systolic (congestive) and diastolic (congestive) heart failure: Secondary | ICD-10-CM | POA: Diagnosis not present

## 2017-07-04 DIAGNOSIS — C84A8 Cutaneous T-cell lymphoma, unspecified, lymph nodes of multiple sites: Secondary | ICD-10-CM | POA: Diagnosis not present

## 2017-07-04 DIAGNOSIS — D709 Neutropenia, unspecified: Secondary | ICD-10-CM | POA: Diagnosis not present

## 2017-07-04 DIAGNOSIS — D701 Agranulocytosis secondary to cancer chemotherapy: Secondary | ICD-10-CM | POA: Diagnosis not present

## 2017-07-04 DIAGNOSIS — K1230 Oral mucositis (ulcerative), unspecified: Secondary | ICD-10-CM | POA: Diagnosis not present

## 2017-07-04 DIAGNOSIS — I739 Peripheral vascular disease, unspecified: Secondary | ICD-10-CM | POA: Insufficient documentation

## 2017-07-04 DIAGNOSIS — R69 Illness, unspecified: Secondary | ICD-10-CM | POA: Diagnosis not present

## 2017-07-04 DIAGNOSIS — A4181 Sepsis due to Enterococcus: Secondary | ICD-10-CM | POA: Diagnosis not present

## 2017-07-04 DIAGNOSIS — D6481 Anemia due to antineoplastic chemotherapy: Secondary | ICD-10-CM | POA: Diagnosis not present

## 2017-07-04 DIAGNOSIS — B952 Enterococcus as the cause of diseases classified elsewhere: Secondary | ICD-10-CM | POA: Diagnosis not present

## 2017-07-04 LAB — CBC WITH DIFFERENTIAL/PLATELET
BAND NEUTROPHILS: 12 %
BASOS ABS: 0 10*3/uL (ref 0.0–0.1)
BLASTS: 0 %
Basophils Relative: 2 %
EOS ABS: 0.1 10*3/uL (ref 0.0–0.7)
Eosinophils Relative: 7 %
HEMATOCRIT: 26.8 % — AB (ref 39.0–52.0)
HEMOGLOBIN: 9 g/dL — AB (ref 13.0–17.0)
Lymphocytes Relative: 45 %
Lymphs Abs: 0.5 10*3/uL — ABNORMAL LOW (ref 0.7–4.0)
MCH: 28.3 pg (ref 26.0–34.0)
MCHC: 33.6 g/dL (ref 30.0–36.0)
MCV: 84.3 fL (ref 78.0–100.0)
METAMYELOCYTES PCT: 0 %
MONOS PCT: 12 %
Monocytes Absolute: 0.1 10*3/uL (ref 0.1–1.0)
Myelocytes: 0 %
NEUTROS ABS: 0.2 10*3/uL — AB (ref 1.7–7.7)
Neutrophils Relative %: 12 %
Other: 10 %
Platelets: 130 10*3/uL — ABNORMAL LOW (ref 150–400)
Promyelocytes Absolute: 0 %
RBC: 3.18 MIL/uL — ABNORMAL LOW (ref 4.22–5.81)
RDW: 15.3 % (ref 11.5–15.5)
WBC: 1 10*3/uL — CL (ref 4.0–10.5)
nRBC: 0 /100 WBC

## 2017-07-04 LAB — URINALYSIS, ROUTINE W REFLEX MICROSCOPIC
Bilirubin Urine: NEGATIVE
GLUCOSE, UA: NEGATIVE mg/dL
Hgb urine dipstick: NEGATIVE
KETONES UR: NEGATIVE mg/dL
LEUKOCYTES UA: NEGATIVE
Nitrite: NEGATIVE
PROTEIN: NEGATIVE mg/dL
Specific Gravity, Urine: 1.027 (ref 1.005–1.030)
pH: 6 (ref 5.0–8.0)

## 2017-07-04 LAB — COMPREHENSIVE METABOLIC PANEL
ALBUMIN: 3 g/dL — AB (ref 3.5–5.0)
ALK PHOS: 118 U/L (ref 38–126)
ALT: 38 U/L (ref 17–63)
ANION GAP: 9 (ref 5–15)
AST: 17 U/L (ref 15–41)
BILIRUBIN TOTAL: 0.6 mg/dL (ref 0.3–1.2)
BUN: 7 mg/dL (ref 6–20)
CALCIUM: 8.5 mg/dL — AB (ref 8.9–10.3)
CO2: 26 mmol/L (ref 22–32)
Chloride: 99 mmol/L — ABNORMAL LOW (ref 101–111)
Creatinine, Ser: 0.75 mg/dL (ref 0.61–1.24)
GLUCOSE: 179 mg/dL — AB (ref 65–99)
Potassium: 3.5 mmol/L (ref 3.5–5.1)
Sodium: 134 mmol/L — ABNORMAL LOW (ref 135–145)
TOTAL PROTEIN: 6.8 g/dL (ref 6.5–8.1)

## 2017-07-04 LAB — CBG MONITORING, ED
GLUCOSE-CAPILLARY: 189 mg/dL — AB (ref 65–99)
Glucose-Capillary: 143 mg/dL — ABNORMAL HIGH (ref 65–99)

## 2017-07-04 LAB — I-STAT CG4 LACTIC ACID, ED: Lactic Acid, Venous: 0.78 mmol/L (ref 0.5–1.9)

## 2017-07-04 LAB — MAGNESIUM: MAGNESIUM: 1.5 mg/dL — AB (ref 1.7–2.4)

## 2017-07-04 MED ORDER — SODIUM CHLORIDE 0.9 % IV BOLUS (SEPSIS)
1000.0000 mL | Freq: Once | INTRAVENOUS | Status: AC
  Administered 2017-07-04: 1000 mL via INTRAVENOUS

## 2017-07-04 MED ORDER — ACYCLOVIR SODIUM 50 MG/ML IV SOLN
500.0000 mg | Freq: Once | INTRAVENOUS | Status: AC
  Administered 2017-07-04: 500 mg via INTRAVENOUS
  Filled 2017-07-04: qty 10

## 2017-07-04 MED ORDER — HYDROCODONE-ACETAMINOPHEN 7.5-325 MG/15ML PO SOLN
15.0000 mL | Freq: Once | ORAL | Status: AC
  Administered 2017-07-04: 15 mL via ORAL
  Filled 2017-07-04: qty 15

## 2017-07-04 MED ORDER — PIPERACILLIN-TAZOBACTAM 3.375 G IVPB 30 MIN
3.3750 g | INTRAVENOUS | Status: AC
  Administered 2017-07-04: 3.375 g via INTRAVENOUS
  Filled 2017-07-04: qty 50

## 2017-07-04 NOTE — Progress Notes (Signed)
A consult was received from an ED physician for Zosyn per pharmacy dosing.  The patient's profile has been reviewed for ht/wt/allergies/indication/available labs.   A one time order has been placed for Zosyn 3.375gm IV.  Further antibiotics/pharmacy consults should be ordered by admitting physician if indicated.                       Thank you,  Leone Haven, PharmD 07/04/17 @ 12:35

## 2017-07-04 NOTE — ED Triage Notes (Signed)
Patient is complaining of pain in his throat which is caused by cancer in his throat. Patient is having trouble swallowing. Patient wife stated that the [facility] doctors told her to bring him here to see if he needed to be transported to [place]. Patient is a cancer patient. Patient has cancer all over per wife. 

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
## 2017-07-04 NOTE — ED Notes (Signed)
Bed: [facility]PT2 Expected date:  Expected time:  Means of arrival:  Comments: 

## 2017-07-04 NOTE — ED Notes (Signed)
Awaiting chest xray to confirm placement to use port access

## 2017-07-05 ENCOUNTER — Telehealth (HOSPITAL_BASED_OUTPATIENT_CLINIC_OR_DEPARTMENT_OTHER): Payer: Self-pay | Admitting: *Deleted

## 2017-07-05 LAB — URINE CULTURE: Culture: NO GROWTH

## 2017-07-05 LAB — BLOOD CULTURE ID PANEL (REFLEXED)
Acinetobacter baumannii: NOT DETECTED
CANDIDA ALBICANS: NOT DETECTED
CANDIDA GLABRATA: NOT DETECTED
Candida krusei: NOT DETECTED
Candida parapsilosis: NOT DETECTED
Candida tropicalis: NOT DETECTED
ENTEROBACTER CLOACAE COMPLEX: NOT DETECTED
ENTEROBACTERIACEAE SPECIES: NOT DETECTED
ENTEROCOCCUS SPECIES: DETECTED — AB
Escherichia coli: NOT DETECTED
Haemophilus influenzae: NOT DETECTED
Klebsiella oxytoca: NOT DETECTED
Klebsiella pneumoniae: NOT DETECTED
LISTERIA MONOCYTOGENES: NOT DETECTED
METHICILLIN RESISTANCE: DETECTED — AB
NEISSERIA MENINGITIDIS: NOT DETECTED
PROTEUS SPECIES: NOT DETECTED
Pseudomonas aeruginosa: NOT DETECTED
STREPTOCOCCUS PYOGENES: NOT DETECTED
STREPTOCOCCUS SPECIES: NOT DETECTED
Serratia marcescens: NOT DETECTED
Staphylococcus aureus (BCID): NOT DETECTED
Staphylococcus species: DETECTED — AB
Streptococcus agalactiae: NOT DETECTED
Streptococcus pneumoniae: NOT DETECTED
VANCOMYCIN RESISTANCE: NOT DETECTED

## 2017-07-05 NOTE — Telephone Encounter (Signed)
Micro lab called one positive aerobic blood culture for Enterococcus species, Staphyloccus Aureus, methicillin resistant. Pt was transferred From [facility] ED to [facility]. Dr Randal Buba reviewed and instructed to call the results to [facility]. I spoke with the CN Santiago Glad at [place] and results reviewed with her. States she will inform the MDs. Her contact number is [phone]

## 2017-07-06 LAB — HERPES SIMPLEX VIRUS(HSV) DNA BY PCR
HSV 1 DNA: POSITIVE — AB
HSV 2 DNA: NEGATIVE

## 2017-07-08 DIAGNOSIS — B952 Enterococcus as the cause of diseases classified elsewhere: Secondary | ICD-10-CM | POA: Insufficient documentation

## 2017-07-08 DIAGNOSIS — R7881 Bacteremia: Secondary | ICD-10-CM | POA: Insufficient documentation

## 2017-07-08 DIAGNOSIS — K1231 Oral mucositis (ulcerative) due to antineoplastic therapy: Secondary | ICD-10-CM | POA: Insufficient documentation

## 2017-07-08 LAB — CULTURE, BLOOD (ROUTINE X 2)

## 2017-07-09 DIAGNOSIS — R509 Fever, unspecified: Secondary | ICD-10-CM | POA: Diagnosis not present

## 2017-07-09 DIAGNOSIS — R52 Pain, unspecified: Secondary | ICD-10-CM | POA: Diagnosis not present

## 2017-07-09 LAB — CULTURE, BLOOD (ROUTINE X 2)
CULTURE: NO GROWTH
SPECIAL REQUESTS: ADEQUATE

## 2017-07-14 DIAGNOSIS — Z5111 Encounter for antineoplastic chemotherapy: Secondary | ICD-10-CM | POA: Diagnosis not present

## 2017-07-14 DIAGNOSIS — M129 Arthropathy, unspecified: Secondary | ICD-10-CM | POA: Diagnosis not present

## 2017-07-14 DIAGNOSIS — G546 Phantom limb syndrome with pain: Secondary | ICD-10-CM | POA: Diagnosis not present

## 2017-07-14 DIAGNOSIS — R69 Illness, unspecified: Secondary | ICD-10-CM | POA: Diagnosis not present

## 2017-07-14 DIAGNOSIS — C8400 Mycosis fungoides, unspecified site: Secondary | ICD-10-CM | POA: Diagnosis not present

## 2017-07-14 DIAGNOSIS — I429 Cardiomyopathy, unspecified: Secondary | ICD-10-CM | POA: Diagnosis not present

## 2017-07-14 DIAGNOSIS — B370 Candidal stomatitis: Secondary | ICD-10-CM | POA: Diagnosis not present

## 2017-07-14 DIAGNOSIS — Z89512 Acquired absence of left leg below knee: Secondary | ICD-10-CM | POA: Diagnosis not present

## 2017-07-14 DIAGNOSIS — C8408 Mycosis fungoides, lymph nodes of multiple sites: Secondary | ICD-10-CM | POA: Diagnosis not present

## 2017-07-14 DIAGNOSIS — Z794 Long term (current) use of insulin: Secondary | ICD-10-CM | POA: Diagnosis not present

## 2017-07-14 DIAGNOSIS — E1165 Type 2 diabetes mellitus with hyperglycemia: Secondary | ICD-10-CM | POA: Diagnosis not present

## 2017-07-14 DIAGNOSIS — C9150 Adult T-cell lymphoma/leukemia (HTLV-1-associated) not having achieved remission: Secondary | ICD-10-CM | POA: Diagnosis not present

## 2017-07-14 DIAGNOSIS — E119 Type 2 diabetes mellitus without complications: Secondary | ICD-10-CM | POA: Diagnosis not present

## 2017-07-14 DIAGNOSIS — R1312 Dysphagia, oropharyngeal phase: Secondary | ICD-10-CM | POA: Diagnosis not present

## 2017-07-14 DIAGNOSIS — E785 Hyperlipidemia, unspecified: Secondary | ICD-10-CM | POA: Diagnosis not present

## 2017-07-14 DIAGNOSIS — R05 Cough: Secondary | ICD-10-CM | POA: Diagnosis not present

## 2017-07-14 DIAGNOSIS — B3781 Candidal esophagitis: Secondary | ICD-10-CM | POA: Diagnosis not present

## 2017-07-21 DIAGNOSIS — C8408 Mycosis fungoides, lymph nodes of multiple sites: Secondary | ICD-10-CM | POA: Diagnosis not present

## 2017-07-22 DIAGNOSIS — G549 Nerve root and plexus disorder, unspecified: Secondary | ICD-10-CM | POA: Diagnosis not present

## 2017-07-22 DIAGNOSIS — E1151 Type 2 diabetes mellitus with diabetic peripheral angiopathy without gangrene: Secondary | ICD-10-CM | POA: Diagnosis not present

## 2017-07-22 DIAGNOSIS — I5042 Chronic combined systolic (congestive) and diastolic (congestive) heart failure: Secondary | ICD-10-CM | POA: Diagnosis not present

## 2017-07-22 DIAGNOSIS — I2510 Atherosclerotic heart disease of native coronary artery without angina pectoris: Secondary | ICD-10-CM | POA: Diagnosis not present

## 2017-07-22 DIAGNOSIS — I110 Hypertensive heart disease with heart failure: Secondary | ICD-10-CM | POA: Diagnosis not present

## 2017-07-22 DIAGNOSIS — M1711 Unilateral primary osteoarthritis, right knee: Secondary | ICD-10-CM | POA: Diagnosis not present

## 2017-07-22 DIAGNOSIS — C8409 Mycosis fungoides, extranodal and solid organ sites: Secondary | ICD-10-CM | POA: Diagnosis not present

## 2017-07-22 DIAGNOSIS — E43 Unspecified severe protein-calorie malnutrition: Secondary | ICD-10-CM | POA: Diagnosis not present

## 2017-07-22 DIAGNOSIS — R2689 Other abnormalities of gait and mobility: Secondary | ICD-10-CM | POA: Diagnosis not present

## 2017-07-22 DIAGNOSIS — M6281 Muscle weakness (generalized): Secondary | ICD-10-CM | POA: Diagnosis not present

## 2017-07-23 DIAGNOSIS — C9101 Acute lymphoblastic leukemia, in remission: Secondary | ICD-10-CM | POA: Diagnosis not present

## 2017-07-23 DIAGNOSIS — C8400 Mycosis fungoides, unspecified site: Secondary | ICD-10-CM | POA: Diagnosis not present

## 2017-07-26 DIAGNOSIS — C8409 Mycosis fungoides, extranodal and solid organ sites: Secondary | ICD-10-CM | POA: Diagnosis not present

## 2017-07-28 DIAGNOSIS — I2510 Atherosclerotic heart disease of native coronary artery without angina pectoris: Secondary | ICD-10-CM | POA: Diagnosis not present

## 2017-07-28 DIAGNOSIS — I110 Hypertensive heart disease with heart failure: Secondary | ICD-10-CM | POA: Diagnosis not present

## 2017-07-28 DIAGNOSIS — C8409 Mycosis fungoides, extranodal and solid organ sites: Secondary | ICD-10-CM | POA: Diagnosis not present

## 2017-07-28 DIAGNOSIS — I5042 Chronic combined systolic (congestive) and diastolic (congestive) heart failure: Secondary | ICD-10-CM | POA: Diagnosis not present

## 2017-07-28 DIAGNOSIS — M1711 Unilateral primary osteoarthritis, right knee: Secondary | ICD-10-CM | POA: Diagnosis not present

## 2017-07-28 DIAGNOSIS — E43 Unspecified severe protein-calorie malnutrition: Secondary | ICD-10-CM | POA: Diagnosis not present

## 2017-07-28 DIAGNOSIS — E1151 Type 2 diabetes mellitus with diabetic peripheral angiopathy without gangrene: Secondary | ICD-10-CM | POA: Diagnosis not present

## 2017-07-28 DIAGNOSIS — R2689 Other abnormalities of gait and mobility: Secondary | ICD-10-CM | POA: Diagnosis not present

## 2017-07-28 DIAGNOSIS — G549 Nerve root and plexus disorder, unspecified: Secondary | ICD-10-CM | POA: Diagnosis not present

## 2017-07-28 DIAGNOSIS — M6281 Muscle weakness (generalized): Secondary | ICD-10-CM | POA: Diagnosis not present

## 2017-07-29 DIAGNOSIS — C8400 Mycosis fungoides, unspecified site: Secondary | ICD-10-CM | POA: Diagnosis not present

## 2017-07-30 ENCOUNTER — Telehealth: Payer: Self-pay | Admitting: Family Medicine

## 2017-07-30 DIAGNOSIS — C8409 Mycosis fungoides, extranodal and solid organ sites: Secondary | ICD-10-CM | POA: Diagnosis not present

## 2017-07-30 DIAGNOSIS — I5042 Chronic combined systolic (congestive) and diastolic (congestive) heart failure: Secondary | ICD-10-CM | POA: Diagnosis not present

## 2017-07-30 DIAGNOSIS — I2510 Atherosclerotic heart disease of native coronary artery without angina pectoris: Secondary | ICD-10-CM | POA: Diagnosis not present

## 2017-07-30 DIAGNOSIS — M1711 Unilateral primary osteoarthritis, right knee: Secondary | ICD-10-CM | POA: Diagnosis not present

## 2017-07-30 DIAGNOSIS — G549 Nerve root and plexus disorder, unspecified: Secondary | ICD-10-CM | POA: Diagnosis not present

## 2017-07-30 DIAGNOSIS — E1151 Type 2 diabetes mellitus with diabetic peripheral angiopathy without gangrene: Secondary | ICD-10-CM | POA: Diagnosis not present

## 2017-07-30 DIAGNOSIS — E43 Unspecified severe protein-calorie malnutrition: Secondary | ICD-10-CM | POA: Diagnosis not present

## 2017-07-30 DIAGNOSIS — M6281 Muscle weakness (generalized): Secondary | ICD-10-CM | POA: Diagnosis not present

## 2017-07-30 DIAGNOSIS — R2689 Other abnormalities of gait and mobility: Secondary | ICD-10-CM | POA: Diagnosis not present

## 2017-07-30 DIAGNOSIS — I110 Hypertensive heart disease with heart failure: Secondary | ICD-10-CM | POA: Diagnosis not present

## 2017-07-30 NOTE — Telephone Encounter (Signed)
ok 

## 2017-07-30 NOTE — Telephone Encounter (Signed)
menda with [facility] states pt asked her to move his eval to sometime next week (week of 10/29)

## 2017-08-02 DIAGNOSIS — C8400 Mycosis fungoides, unspecified site: Secondary | ICD-10-CM | POA: Diagnosis not present

## 2017-08-04 DIAGNOSIS — N400 Enlarged prostate without lower urinary tract symptoms: Secondary | ICD-10-CM | POA: Diagnosis not present

## 2017-08-04 DIAGNOSIS — C8408 Mycosis fungoides, lymph nodes of multiple sites: Secondary | ICD-10-CM | POA: Diagnosis not present

## 2017-08-04 DIAGNOSIS — R05 Cough: Secondary | ICD-10-CM | POA: Diagnosis not present

## 2017-08-04 DIAGNOSIS — R69 Illness, unspecified: Secondary | ICD-10-CM | POA: Diagnosis not present

## 2017-08-04 DIAGNOSIS — B370 Candidal stomatitis: Secondary | ICD-10-CM | POA: Diagnosis not present

## 2017-08-04 DIAGNOSIS — E1165 Type 2 diabetes mellitus with hyperglycemia: Secondary | ICD-10-CM | POA: Diagnosis not present

## 2017-08-04 DIAGNOSIS — Z794 Long term (current) use of insulin: Secondary | ICD-10-CM | POA: Diagnosis not present

## 2017-08-04 DIAGNOSIS — Z5111 Encounter for antineoplastic chemotherapy: Secondary | ICD-10-CM | POA: Diagnosis not present

## 2017-08-04 DIAGNOSIS — I10 Essential (primary) hypertension: Secondary | ICD-10-CM | POA: Diagnosis not present

## 2017-08-04 DIAGNOSIS — C8405 Mycosis fungoides, lymph nodes of inguinal region and lower limb: Secondary | ICD-10-CM | POA: Diagnosis not present

## 2017-08-04 DIAGNOSIS — E785 Hyperlipidemia, unspecified: Secondary | ICD-10-CM | POA: Diagnosis not present

## 2017-08-09 MED ORDER — INSULIN GLARGINE 100 UNIT/ML ~~LOC~~ SOLN
34.00 | SUBCUTANEOUS | Status: DC
Start: 2017-08-10 — End: 2017-08-09

## 2017-08-09 MED ORDER — PROCHLORPERAZINE MALEATE 10 MG PO TABS
5.00 | ORAL_TABLET | ORAL | Status: DC
Start: ? — End: 2017-08-09

## 2017-08-09 MED ORDER — BENZONATATE 100 MG PO CAPS
100.00 | ORAL_CAPSULE | ORAL | Status: DC
Start: ? — End: 2017-08-09

## 2017-08-09 MED ORDER — LORAZEPAM 2 MG/ML IJ SOLN
0.50 | INTRAMUSCULAR | Status: DC
Start: ? — End: 2017-08-09

## 2017-08-09 MED ORDER — AMITRIPTYLINE HCL 50 MG PO TABS
50.00 | ORAL_TABLET | ORAL | Status: DC
Start: 2017-08-09 — End: 2017-08-09

## 2017-08-09 MED ORDER — POLYETHYLENE GLYCOL 3350 17 G PO PACK
17.00 | PACK | ORAL | Status: DC
Start: 2017-08-10 — End: 2017-08-09

## 2017-08-09 MED ORDER — CODEINE SULFATE 15 MG PO TABS
15.00 | ORAL_TABLET | ORAL | Status: DC
Start: ? — End: 2017-08-09

## 2017-08-09 MED ORDER — GUAIFENESIN-DM 100-10 MG/5ML PO SYRP
5.00 | ORAL_SOLUTION | ORAL | Status: DC
Start: ? — End: 2017-08-09

## 2017-08-09 MED ORDER — INSULIN LISPRO 100 UNIT/ML ~~LOC~~ SOLN
.00 | SUBCUTANEOUS | Status: DC
Start: 2017-08-09 — End: 2017-08-09

## 2017-08-09 MED ORDER — GABAPENTIN 300 MG PO CAPS
600.00 | ORAL_CAPSULE | ORAL | Status: DC
Start: 2017-08-10 — End: 2017-08-09

## 2017-08-09 MED ORDER — NITROGLYCERIN 0.4 MG SL SUBL
0.40 | SUBLINGUAL_TABLET | SUBLINGUAL | Status: DC
Start: ? — End: 2017-08-09

## 2017-08-09 MED ORDER — TAMSULOSIN HCL 0.4 MG PO CAPS
0.40 | ORAL_CAPSULE | ORAL | Status: DC
Start: 2017-08-10 — End: 2017-08-09

## 2017-08-09 MED ORDER — SENNOSIDES-DOCUSATE SODIUM 8.6-50 MG PO TABS
2.00 | ORAL_TABLET | ORAL | Status: DC
Start: 2017-08-09 — End: 2017-08-09

## 2017-08-09 MED ORDER — INSULIN LISPRO 100 UNIT/ML ~~LOC~~ SOLN
15.00 | SUBCUTANEOUS | Status: DC
Start: 2017-08-09 — End: 2017-08-09

## 2017-08-09 MED ORDER — FEXOFENADINE HCL 60 MG PO TABS
60.00 | ORAL_TABLET | ORAL | Status: DC
Start: 2017-08-10 — End: 2017-08-09

## 2017-08-09 MED ORDER — LORAZEPAM 1 MG PO TABS
.50 | ORAL_TABLET | ORAL | Status: DC
Start: ? — End: 2017-08-09

## 2017-08-09 MED ORDER — LACTULOSE 10 GM/15ML PO SOLN
30.00 | ORAL | Status: DC
Start: ? — End: 2017-08-09

## 2017-08-09 MED ORDER — FIRST-MOUTHWASH BLM MT SUSP
5.00 | OROMUCOSAL | Status: DC
Start: ? — End: 2017-08-09

## 2017-08-09 MED ORDER — ACETAMINOPHEN 325 MG PO TABS
650.00 | ORAL_TABLET | ORAL | Status: DC
Start: ? — End: 2017-08-09

## 2017-08-09 MED ORDER — MAGNESIUM CITRATE PO SOLN
296.00 | ORAL | Status: DC
Start: ? — End: 2017-08-09

## 2017-08-09 MED ORDER — APIXABAN 5 MG PO TABS
5.00 | ORAL_TABLET | ORAL | Status: DC
Start: 2017-08-09 — End: 2017-08-09

## 2017-08-09 MED ORDER — CARVEDILOL 25 MG PO TABS
25.00 | ORAL_TABLET | ORAL | Status: DC
Start: 2017-08-10 — End: 2017-08-09

## 2017-08-09 MED ORDER — MELATONIN 3 MG PO TABS
3.00 | ORAL_TABLET | ORAL | Status: DC
Start: ? — End: 2017-08-09

## 2017-08-09 MED ORDER — TIZANIDINE HCL 2 MG PO TABS
2.00 | ORAL_TABLET | ORAL | Status: DC
Start: 2017-08-09 — End: 2017-08-09

## 2017-08-09 MED ORDER — MUPIROCIN 2 % EX OINT
TOPICAL_OINTMENT | CUTANEOUS | Status: DC
Start: 2017-08-09 — End: 2017-08-09

## 2017-08-09 MED ORDER — LIME OIL OIL
1.00 | TOPICAL_OIL | Status: DC
Start: ? — End: 2017-08-09

## 2017-08-09 MED ORDER — PROCHLORPERAZINE EDISYLATE 5 MG/ML IJ SOLN
5.00 | INTRAMUSCULAR | Status: DC
Start: ? — End: 2017-08-09

## 2017-08-10 DIAGNOSIS — C8408 Mycosis fungoides, lymph nodes of multiple sites: Secondary | ICD-10-CM | POA: Diagnosis not present

## 2017-08-12 ENCOUNTER — Telehealth: Payer: Self-pay | Admitting: Family Medicine

## 2017-08-12 DIAGNOSIS — C8400 Mycosis fungoides, unspecified site: Secondary | ICD-10-CM | POA: Diagnosis not present

## 2017-08-12 DIAGNOSIS — Z6720 Type B blood, Rh positive: Secondary | ICD-10-CM | POA: Diagnosis not present

## 2017-08-12 NOTE — Telephone Encounter (Signed)
Eric Price came by to check the status of the order.

## 2017-08-16 DIAGNOSIS — C8400 Mycosis fungoides, unspecified site: Secondary | ICD-10-CM | POA: Diagnosis not present

## 2017-08-16 NOTE — Telephone Encounter (Signed)
Placed in Dr Erick Blinks folder on his desk.

## 2017-08-16 NOTE — Telephone Encounter (Signed)
Left message on machine for Mickel Baas that paperwork is ready for pick up. Faxed and copy placed up front.

## 2017-08-16 NOTE — Telephone Encounter (Signed)
done

## 2017-08-18 DIAGNOSIS — R2689 Other abnormalities of gait and mobility: Secondary | ICD-10-CM | POA: Diagnosis not present

## 2017-08-18 DIAGNOSIS — G549 Nerve root and plexus disorder, unspecified: Secondary | ICD-10-CM | POA: Diagnosis not present

## 2017-08-18 DIAGNOSIS — E43 Unspecified severe protein-calorie malnutrition: Secondary | ICD-10-CM | POA: Diagnosis not present

## 2017-08-18 DIAGNOSIS — C8409 Mycosis fungoides, extranodal and solid organ sites: Secondary | ICD-10-CM | POA: Diagnosis not present

## 2017-08-18 DIAGNOSIS — I5042 Chronic combined systolic (congestive) and diastolic (congestive) heart failure: Secondary | ICD-10-CM | POA: Diagnosis not present

## 2017-08-18 DIAGNOSIS — M6281 Muscle weakness (generalized): Secondary | ICD-10-CM | POA: Diagnosis not present

## 2017-08-18 DIAGNOSIS — M1711 Unilateral primary osteoarthritis, right knee: Secondary | ICD-10-CM | POA: Diagnosis not present

## 2017-08-18 DIAGNOSIS — E1151 Type 2 diabetes mellitus with diabetic peripheral angiopathy without gangrene: Secondary | ICD-10-CM | POA: Diagnosis not present

## 2017-08-18 DIAGNOSIS — I110 Hypertensive heart disease with heart failure: Secondary | ICD-10-CM | POA: Diagnosis not present

## 2017-08-18 DIAGNOSIS — I2510 Atherosclerotic heart disease of native coronary artery without angina pectoris: Secondary | ICD-10-CM | POA: Diagnosis not present

## 2017-08-19 DIAGNOSIS — C8409 Mycosis fungoides, extranodal and solid organ sites: Secondary | ICD-10-CM | POA: Diagnosis not present

## 2017-08-23 DIAGNOSIS — R2689 Other abnormalities of gait and mobility: Secondary | ICD-10-CM | POA: Diagnosis not present

## 2017-08-23 DIAGNOSIS — E43 Unspecified severe protein-calorie malnutrition: Secondary | ICD-10-CM | POA: Diagnosis not present

## 2017-08-23 DIAGNOSIS — G549 Nerve root and plexus disorder, unspecified: Secondary | ICD-10-CM | POA: Diagnosis not present

## 2017-08-23 DIAGNOSIS — I110 Hypertensive heart disease with heart failure: Secondary | ICD-10-CM | POA: Diagnosis not present

## 2017-08-23 DIAGNOSIS — M1711 Unilateral primary osteoarthritis, right knee: Secondary | ICD-10-CM | POA: Diagnosis not present

## 2017-08-23 DIAGNOSIS — M6281 Muscle weakness (generalized): Secondary | ICD-10-CM | POA: Diagnosis not present

## 2017-08-23 DIAGNOSIS — C8409 Mycosis fungoides, extranodal and solid organ sites: Secondary | ICD-10-CM | POA: Diagnosis not present

## 2017-08-23 DIAGNOSIS — E1151 Type 2 diabetes mellitus with diabetic peripheral angiopathy without gangrene: Secondary | ICD-10-CM | POA: Diagnosis not present

## 2017-08-23 DIAGNOSIS — I5042 Chronic combined systolic (congestive) and diastolic (congestive) heart failure: Secondary | ICD-10-CM | POA: Diagnosis not present

## 2017-08-23 DIAGNOSIS — I2510 Atherosclerotic heart disease of native coronary artery without angina pectoris: Secondary | ICD-10-CM | POA: Diagnosis not present

## 2017-08-24 ENCOUNTER — Telehealth: Payer: Self-pay | Admitting: Family Medicine

## 2017-08-24 NOTE — Telephone Encounter (Signed)
Copied from [place] [id number]. Topic: Quick Communication - See Telephone Encounter >> Aug 24, 2017  3:51 PM Antonieta Iba C wrote: CRM for notification. See Telephone encounter for:  08/24/17.      Antony Salmon - [phone]  Called in to make provider aware. Pt is requesting to postpone his PT until after his surgery which is next week. She said that pt will be admitted for 6 days after surgery.   No call back needed.

## 2017-08-30 DIAGNOSIS — E669 Obesity, unspecified: Secondary | ICD-10-CM | POA: Diagnosis not present

## 2017-08-30 DIAGNOSIS — R69 Illness, unspecified: Secondary | ICD-10-CM | POA: Diagnosis not present

## 2017-08-30 DIAGNOSIS — Z6830 Body mass index (BMI) 30.0-30.9, adult: Secondary | ICD-10-CM | POA: Diagnosis not present

## 2017-08-30 DIAGNOSIS — C8400 Mycosis fungoides, unspecified site: Secondary | ICD-10-CM | POA: Diagnosis not present

## 2017-08-30 DIAGNOSIS — Z5111 Encounter for antineoplastic chemotherapy: Secondary | ICD-10-CM | POA: Diagnosis not present

## 2017-08-30 DIAGNOSIS — I429 Cardiomyopathy, unspecified: Secondary | ICD-10-CM | POA: Diagnosis not present

## 2017-08-30 DIAGNOSIS — C8408 Mycosis fungoides, lymph nodes of multiple sites: Secondary | ICD-10-CM | POA: Diagnosis not present

## 2017-08-30 DIAGNOSIS — E1165 Type 2 diabetes mellitus with hyperglycemia: Secondary | ICD-10-CM | POA: Diagnosis not present

## 2017-08-30 DIAGNOSIS — I10 Essential (primary) hypertension: Secondary | ICD-10-CM | POA: Diagnosis not present

## 2017-08-30 DIAGNOSIS — K219 Gastro-esophageal reflux disease without esophagitis: Secondary | ICD-10-CM | POA: Diagnosis not present

## 2017-08-30 DIAGNOSIS — E785 Hyperlipidemia, unspecified: Secondary | ICD-10-CM | POA: Diagnosis not present

## 2017-08-31 ENCOUNTER — Telehealth: Payer: Self-pay | Admitting: Family Medicine

## 2017-08-31 NOTE — Telephone Encounter (Signed)
Copied from [place]. Topic: Quick Communication - See Telephone Encounter >> Aug 31, 2017  3:37 PM Robina Ade, Helene Kelp D wrote: CRM for notification. See Telephone encounter for: 08/31/17. Patient called and would like to talk to [place] or provider about his artifical leg and parts that he need and socks special made. Screw to told leg that is rubber patient also needs.

## 2017-08-31 NOTE — Telephone Encounter (Signed)
Left message on machine for patient returning his call.  The last office visit note has been faxed.

## 2017-09-01 ENCOUNTER — Telehealth: Payer: Self-pay | Admitting: Internal Medicine

## 2017-09-01 NOTE — Telephone Encounter (Signed)
Pt canceled appt with [facility] 09/03/17 stating he is at [facility] receiving chemo treatments. He will call later to reschedule.

## 2017-09-03 ENCOUNTER — Ambulatory Visit: Payer: PPO | Admitting: Internal Medicine

## 2017-09-04 MED ORDER — INSULIN GLARGINE 100 UNIT/ML ~~LOC~~ SOLN
34.00 | SUBCUTANEOUS | Status: DC
Start: 2017-09-05 — End: 2017-09-04

## 2017-09-04 MED ORDER — TIZANIDINE HCL 2 MG PO TABS
2.00 | ORAL_TABLET | ORAL | Status: DC
Start: 2017-09-04 — End: 2017-09-04

## 2017-09-04 MED ORDER — PANTOPRAZOLE SODIUM 40 MG PO TBEC
40.00 | DELAYED_RELEASE_TABLET | ORAL | Status: DC
Start: 2017-09-05 — End: 2017-09-04

## 2017-09-04 MED ORDER — GUAIFENESIN-DM 100-10 MG/5ML PO SYRP
5.00 | ORAL_SOLUTION | ORAL | Status: DC
Start: ? — End: 2017-09-04

## 2017-09-04 MED ORDER — FEXOFENADINE HCL 60 MG PO TABS
60.00 | ORAL_TABLET | ORAL | Status: DC
Start: 2017-09-05 — End: 2017-09-04

## 2017-09-04 MED ORDER — LIME OIL OIL
TOPICAL_OIL | Status: DC
Start: ? — End: 2017-09-04

## 2017-09-04 MED ORDER — ONDANSETRON HCL 8 MG PO TABS
4.00 | ORAL_TABLET | ORAL | Status: DC
Start: ? — End: 2017-09-04

## 2017-09-04 MED ORDER — ACETAMINOPHEN 325 MG PO TABS
650.00 | ORAL_TABLET | ORAL | Status: DC
Start: ? — End: 2017-09-04

## 2017-09-04 MED ORDER — DEXTROSE 50 % IV SOLN
12.50 | INTRAVENOUS | Status: DC
Start: ? — End: 2017-09-04

## 2017-09-04 MED ORDER — MUPIROCIN 2 % EX OINT
TOPICAL_OINTMENT | CUTANEOUS | Status: DC
Start: 2017-09-04 — End: 2017-09-04

## 2017-09-04 MED ORDER — BENZONATATE 100 MG PO CAPS
100.00 | ORAL_CAPSULE | ORAL | Status: DC
Start: ? — End: 2017-09-04

## 2017-09-04 MED ORDER — HYDROXYZINE HCL 10 MG PO TABS
10.00 | ORAL_TABLET | ORAL | Status: DC
Start: ? — End: 2017-09-04

## 2017-09-04 MED ORDER — PROCHLORPERAZINE MALEATE 5 MG PO TABS
5.00 | ORAL_TABLET | ORAL | Status: DC
Start: ? — End: 2017-09-04

## 2017-09-04 MED ORDER — PROCHLORPERAZINE EDISYLATE 5 MG/ML IJ SOLN
5.00 | INTRAMUSCULAR | Status: DC
Start: ? — End: 2017-09-04

## 2017-09-04 MED ORDER — LACTULOSE 10 GM/15ML PO SOLN
30.00 | ORAL | Status: DC
Start: ? — End: 2017-09-04

## 2017-09-04 MED ORDER — POLYETHYLENE GLYCOL 3350 17 G PO PACK
17.00 | PACK | ORAL | Status: DC
Start: ? — End: 2017-09-04

## 2017-09-04 MED ORDER — TAMSULOSIN HCL 0.4 MG PO CAPS
0.40 | ORAL_CAPSULE | ORAL | Status: DC
Start: 2017-09-05 — End: 2017-09-04

## 2017-09-04 MED ORDER — AMITRIPTYLINE HCL 50 MG PO TABS
50.00 | ORAL_TABLET | ORAL | Status: DC
Start: 2017-09-04 — End: 2017-09-04

## 2017-09-04 MED ORDER — GLUCAGON HCL RDNA (DIAGNOSTIC) 1 MG IJ SOLR
1.00 | INTRAMUSCULAR | Status: DC
Start: ? — End: 2017-09-04

## 2017-09-04 MED ORDER — TRAMADOL HCL 50 MG PO TABS
50.00 | ORAL_TABLET | ORAL | Status: DC
Start: ? — End: 2017-09-04

## 2017-09-04 MED ORDER — SENNOSIDES-DOCUSATE SODIUM 8.6-50 MG PO TABS
2.00 | ORAL_TABLET | ORAL | Status: DC
Start: ? — End: 2017-09-04

## 2017-09-04 MED ORDER — LORAZEPAM 2 MG/ML IJ SOLN
0.50 | INTRAMUSCULAR | Status: DC
Start: ? — End: 2017-09-04

## 2017-09-04 MED ORDER — INSULIN LISPRO 100 UNIT/ML ~~LOC~~ SOLN
7.00 | SUBCUTANEOUS | Status: DC
Start: 2017-09-04 — End: 2017-09-04

## 2017-09-04 MED ORDER — CARVEDILOL 25 MG PO TABS
25.00 | ORAL_TABLET | ORAL | Status: DC
Start: 2017-09-04 — End: 2017-09-04

## 2017-09-04 MED ORDER — GABAPENTIN 300 MG PO CAPS
600.00 | ORAL_CAPSULE | ORAL | Status: DC
Start: 2017-09-04 — End: 2017-09-04

## 2017-09-04 MED ORDER — APIXABAN 5 MG PO TABS
5.00 | ORAL_TABLET | ORAL | Status: DC
Start: 2017-09-04 — End: 2017-09-04

## 2017-09-04 MED ORDER — FIRST-MOUTHWASH BLM MT SUSP
5.00 | OROMUCOSAL | Status: DC
Start: ? — End: 2017-09-04

## 2017-09-04 MED ORDER — LORAZEPAM 0.5 MG PO TABS
.50 | ORAL_TABLET | ORAL | Status: DC
Start: ? — End: 2017-09-04

## 2017-09-04 MED ORDER — ALBUTEROL SULFATE HFA 108 (90 BASE) MCG/ACT IN AERS
2.00 | INHALATION_SPRAY | RESPIRATORY_TRACT | Status: DC
Start: ? — End: 2017-09-04

## 2017-09-06 DIAGNOSIS — C8408 Mycosis fungoides, lymph nodes of multiple sites: Secondary | ICD-10-CM | POA: Diagnosis not present

## 2017-09-08 ENCOUNTER — Encounter: Payer: Self-pay | Admitting: Family Medicine

## 2017-09-08 ENCOUNTER — Ambulatory Visit (INDEPENDENT_AMBULATORY_CARE_PROVIDER_SITE_OTHER): Payer: Medicare HMO | Admitting: Family Medicine

## 2017-09-08 VITALS — BP 110/68 | HR 101 | Temp 97.5°F | Wt 203.5 lb

## 2017-09-08 DIAGNOSIS — C8408 Mycosis fungoides, lymph nodes of multiple sites: Secondary | ICD-10-CM | POA: Diagnosis not present

## 2017-09-08 DIAGNOSIS — Z89512 Acquired absence of left leg below knee: Secondary | ICD-10-CM

## 2017-09-08 DIAGNOSIS — E1159 Type 2 diabetes mellitus with other circulatory complications: Secondary | ICD-10-CM | POA: Diagnosis not present

## 2017-09-08 DIAGNOSIS — E1165 Type 2 diabetes mellitus with hyperglycemia: Secondary | ICD-10-CM

## 2017-09-08 DIAGNOSIS — I10 Essential (primary) hypertension: Secondary | ICD-10-CM

## 2017-09-08 DIAGNOSIS — I739 Peripheral vascular disease, unspecified: Secondary | ICD-10-CM
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
## 2017-09-09 DIAGNOSIS — C8400 Mycosis fungoides, unspecified site: Secondary | ICD-10-CM | POA: Diagnosis not present

## 2017-09-15 ENCOUNTER — Ambulatory Visit: Payer: PPO | Admitting: Family Medicine

## 2017-09-16 ENCOUNTER — Telehealth: Payer: Self-pay | Admitting: Family Medicine

## 2017-09-16 DIAGNOSIS — C8400 Mycosis fungoides, unspecified site: Secondary | ICD-10-CM | POA: Diagnosis not present

## 2017-09-16 NOTE — Telephone Encounter (Signed)
Note faxed and confirmed

## 2017-09-16 NOTE — Telephone Encounter (Signed)
Copied from [employer]. Topic: Quick Communication - See Telephone Encounter >> Sep 16, 2017  9:55 AM Antonieta Iba C wrote:   CRM for notification. See Telephone encounter for: Eric Price -- with [employer] [phone] is working with pt to get a prosthetic. She said that the office need pt's most recent office notes faxed to them at: [phone] to further assist pt. Please assist further.   09/16/17.

## 2017-09-17 DIAGNOSIS — C8409 Mycosis fungoides, extranodal and solid organ sites: Secondary | ICD-10-CM | POA: Diagnosis not present

## 2017-09-17 DIAGNOSIS — I5042 Chronic combined systolic (congestive) and diastolic (congestive) heart failure: Secondary | ICD-10-CM | POA: Diagnosis not present

## 2017-09-17 DIAGNOSIS — E43 Unspecified severe protein-calorie malnutrition: Secondary | ICD-10-CM | POA: Diagnosis not present

## 2017-09-17 DIAGNOSIS — R2689 Other abnormalities of gait and mobility: Secondary | ICD-10-CM | POA: Diagnosis not present

## 2017-09-17 DIAGNOSIS — G549 Nerve root and plexus disorder, unspecified: Secondary | ICD-10-CM | POA: Diagnosis not present

## 2017-09-17 DIAGNOSIS — M6281 Muscle weakness (generalized): Secondary | ICD-10-CM | POA: Diagnosis not present

## 2017-09-17 DIAGNOSIS — M1711 Unilateral primary osteoarthritis, right knee: Secondary | ICD-10-CM | POA: Diagnosis not present

## 2017-09-17 DIAGNOSIS — E1151 Type 2 diabetes mellitus with diabetic peripheral angiopathy without gangrene: Secondary | ICD-10-CM | POA: Diagnosis not present

## 2017-09-17 DIAGNOSIS — I2510 Atherosclerotic heart disease of native coronary artery without angina pectoris: Secondary | ICD-10-CM | POA: Diagnosis not present

## 2017-09-17 DIAGNOSIS — I110 Hypertensive heart disease with heart failure: Secondary | ICD-10-CM | POA: Diagnosis not present

## 2017-09-20 DIAGNOSIS — C8408 Mycosis fungoides, lymph nodes of multiple sites: Secondary | ICD-10-CM | POA: Diagnosis not present

## 2017-09-20 DIAGNOSIS — J029 Acute pharyngitis, unspecified: Secondary | ICD-10-CM | POA: Diagnosis not present

## 2017-09-20 DIAGNOSIS — R05 Cough: Secondary | ICD-10-CM | POA: Diagnosis not present

## 2017-09-20 DIAGNOSIS — Z923 Personal history of irradiation: Secondary | ICD-10-CM | POA: Diagnosis not present

## 2017-09-21 ENCOUNTER — Telehealth: Payer: Self-pay | Admitting: Family Medicine

## 2017-09-21 NOTE — Telephone Encounter (Signed)
ok 

## 2017-09-21 NOTE — Telephone Encounter (Signed)
Copied from [place] #[id number]. Topic: Quick Communication - See Telephone Encounter >> Sep 21, 2017  9:36 AM Antonieta Iba C wrote: CRM for notification. See Telephone encounter for: Tawni Carnes w/ [facility] -  called in to request order to work with pt. For verbal CB: [phone] written: Fax: [phone]   Frequency:  PT- 1 week 1                     SN (Skilled Nursing) - 1 day 1                     SN - 1 week 4   09/21/17.

## 2017-09-21 NOTE — Telephone Encounter (Signed)
Verbal orders given  

## 2017-09-23 DIAGNOSIS — Z89512 Acquired absence of left leg below knee: Secondary | ICD-10-CM | POA: Diagnosis not present

## 2017-09-29 DIAGNOSIS — I10 Essential (primary) hypertension: Secondary | ICD-10-CM | POA: Diagnosis not present

## 2017-09-29 DIAGNOSIS — G620 Drug-induced polyneuropathy: Secondary | ICD-10-CM | POA: Diagnosis not present

## 2017-09-29 DIAGNOSIS — C8408 Mycosis fungoides, lymph nodes of multiple sites: Secondary | ICD-10-CM | POA: Diagnosis not present

## 2017-09-29 DIAGNOSIS — C84A0 Cutaneous T-cell lymphoma, unspecified, unspecified site: Secondary | ICD-10-CM | POA: Diagnosis not present

## 2017-09-29 DIAGNOSIS — T380X5A Adverse effect of glucocorticoids and synthetic analogues, initial encounter: Secondary | ICD-10-CM | POA: Diagnosis not present

## 2017-09-29 DIAGNOSIS — Z5111 Encounter for antineoplastic chemotherapy: Secondary | ICD-10-CM | POA: Diagnosis not present

## 2017-09-29 DIAGNOSIS — I429 Cardiomyopathy, unspecified: Secondary | ICD-10-CM | POA: Diagnosis not present

## 2017-09-29 DIAGNOSIS — R69 Illness, unspecified: Secondary | ICD-10-CM | POA: Diagnosis not present

## 2017-09-29 DIAGNOSIS — E1165 Type 2 diabetes mellitus with hyperglycemia: Secondary | ICD-10-CM | POA: Diagnosis not present

## 2017-09-29 DIAGNOSIS — E785 Hyperlipidemia, unspecified: Secondary | ICD-10-CM | POA: Diagnosis not present

## 2017-09-29 DIAGNOSIS — E1142 Type 2 diabetes mellitus with diabetic polyneuropathy: Secondary | ICD-10-CM | POA: Diagnosis not present

## 2017-09-29 DIAGNOSIS — T451X5A Adverse effect of antineoplastic and immunosuppressive drugs, initial encounter: Secondary | ICD-10-CM | POA: Diagnosis not present

## 2017-09-29 DIAGNOSIS — C8400 Mycosis fungoides, unspecified site: Secondary | ICD-10-CM | POA: Diagnosis not present

## 2017-10-04 MED ORDER — FOLIC ACID 1 MG PO TABS
1.00 | ORAL_TABLET | ORAL | Status: DC
Start: 2017-10-05 — End: 2017-10-04

## 2017-10-04 MED ORDER — LIME OIL OIL
TOPICAL_OIL | Status: DC
Start: ? — End: 2017-10-04

## 2017-10-04 MED ORDER — INSULIN GLARGINE 100 UNIT/ML ~~LOC~~ SOLN
28.00 | SUBCUTANEOUS | Status: DC
Start: 2017-10-04 — End: 2017-10-04

## 2017-10-04 MED ORDER — INSULIN LISPRO 100 UNIT/ML ~~LOC~~ SOLN
0.00 | SUBCUTANEOUS | Status: DC
Start: 2017-10-04 — End: 2017-10-04

## 2017-10-04 MED ORDER — POLYETHYLENE GLYCOL 3350 17 G PO PACK
17.00 | PACK | ORAL | Status: DC
Start: ? — End: 2017-10-04

## 2017-10-04 MED ORDER — PANTOPRAZOLE SODIUM 40 MG PO TBEC
40.00 | DELAYED_RELEASE_TABLET | ORAL | Status: DC
Start: 2017-10-05 — End: 2017-10-04

## 2017-10-04 MED ORDER — NITROGLYCERIN 0.4 MG SL SUBL
0.40 | SUBLINGUAL_TABLET | SUBLINGUAL | Status: DC
Start: ? — End: 2017-10-04

## 2017-10-04 MED ORDER — BENZONATATE 100 MG PO CAPS
100.00 | ORAL_CAPSULE | ORAL | Status: DC
Start: ? — End: 2017-10-04

## 2017-10-04 MED ORDER — AMITRIPTYLINE HCL 50 MG PO TABS
50.00 | ORAL_TABLET | ORAL | Status: DC
Start: 2017-10-05 — End: 2017-10-04

## 2017-10-04 MED ORDER — PROCHLORPERAZINE MALEATE 10 MG PO TABS
10.00 | ORAL_TABLET | ORAL | Status: DC
Start: ? — End: 2017-10-04

## 2017-10-04 MED ORDER — TRAMADOL HCL 50 MG PO TABS
50.00 | ORAL_TABLET | ORAL | Status: DC
Start: ? — End: 2017-10-04

## 2017-10-04 MED ORDER — LACTULOSE 10 GM/15ML PO SOLN
30.00 | ORAL | Status: DC
Start: ? — End: 2017-10-04

## 2017-10-04 MED ORDER — ACYCLOVIR 200 MG PO CAPS
400.00 | ORAL_CAPSULE | ORAL | Status: DC
Start: 2017-10-04 — End: 2017-10-04

## 2017-10-04 MED ORDER — APIXABAN 5 MG PO TABS
5.00 | ORAL_TABLET | ORAL | Status: DC
Start: 2017-10-04 — End: 2017-10-04

## 2017-10-04 MED ORDER — CALCIUM CARBONATE ANTACID 750 MG PO CHEW
CHEWABLE_TABLET | ORAL | Status: DC
Start: ? — End: 2017-10-04

## 2017-10-04 MED ORDER — GABAPENTIN 300 MG PO CAPS
600.00 | ORAL_CAPSULE | ORAL | Status: DC
Start: 2017-10-04 — End: 2017-10-04

## 2017-10-04 MED ORDER — FLUCONAZOLE 200 MG PO TABS
200.00 | ORAL_TABLET | ORAL | Status: DC
Start: 2017-10-05 — End: 2017-10-04

## 2017-10-04 MED ORDER — CARVEDILOL 25 MG PO TABS
25.00 | ORAL_TABLET | ORAL | Status: DC
Start: 2017-10-05 — End: 2017-10-04

## 2017-10-04 MED ORDER — ACETAMINOPHEN 325 MG PO TABS
650.00 | ORAL_TABLET | ORAL | Status: DC
Start: ? — End: 2017-10-04

## 2017-10-04 MED ORDER — LORAZEPAM 2 MG/ML IJ SOLN
1.00 | INTRAMUSCULAR | Status: DC
Start: ? — End: 2017-10-04

## 2017-10-04 MED ORDER — MELATONIN 3 MG PO TABS
3.00 | ORAL_TABLET | ORAL | Status: DC
Start: 2017-10-04 — End: 2017-10-04

## 2017-10-04 MED ORDER — TIZANIDINE HCL 2 MG PO TABS
2.00 | ORAL_TABLET | ORAL | Status: DC
Start: 2017-10-04 — End: 2017-10-04

## 2017-10-04 MED ORDER — INSULIN LISPRO 100 UNIT/ML ~~LOC~~ SOLN
17.00 | SUBCUTANEOUS | Status: DC
Start: 2017-10-04 — End: 2017-10-04

## 2017-10-04 MED ORDER — PROCHLORPERAZINE EDISYLATE 5 MG/ML IJ SOLN
10.00 | INTRAMUSCULAR | Status: DC
Start: ? — End: 2017-10-04

## 2017-10-04 MED ORDER — ALBUTEROL SULFATE HFA 108 (90 BASE) MCG/ACT IN AERS
2.00 | INHALATION_SPRAY | RESPIRATORY_TRACT | Status: DC
Start: ? — End: 2017-10-04

## 2017-10-04 MED ORDER — DEXTROSE 50 % IV SOLN
12.50 | INTRAVENOUS | Status: DC
Start: ? — End: 2017-10-04

## 2017-10-04 MED ORDER — LORAZEPAM 1 MG PO TABS
1.00 | ORAL_TABLET | ORAL | Status: DC
Start: ? — End: 2017-10-04

## 2017-10-04 MED ORDER — GUAIFENESIN-DM 100-10 MG/5ML PO SYRP
10.00 | ORAL_SOLUTION | ORAL | Status: DC
Start: ? — End: 2017-10-04

## 2017-10-04 MED ORDER — HYDROXYZINE PAMOATE 25 MG PO CAPS
25.00 | ORAL_CAPSULE | ORAL | Status: DC
Start: ? — End: 2017-10-04

## 2017-10-04 MED ORDER — SENNOSIDES-DOCUSATE SODIUM 8.6-50 MG PO TABS
2.00 | ORAL_TABLET | ORAL | Status: DC
Start: 2017-10-04 — End: 2017-10-04

## 2017-10-04 MED ORDER — TAMSULOSIN HCL 0.4 MG PO CAPS
0.40 | ORAL_CAPSULE | ORAL | Status: DC
Start: 2017-10-05 — End: 2017-10-04

## 2017-10-04 MED ORDER — FEXOFENADINE HCL 60 MG PO TABS
60.00 | ORAL_TABLET | ORAL | Status: DC
Start: 2017-10-05 — End: 2017-10-04

## 2017-10-04 MED ORDER — GLUCAGON HCL RDNA (DIAGNOSTIC) 1 MG IJ SOLR
1.00 | INTRAMUSCULAR | Status: DC
Start: ? — End: 2017-10-04

## 2017-10-06 DIAGNOSIS — C8408 Mycosis fungoides, lymph nodes of multiple sites: Secondary | ICD-10-CM | POA: Diagnosis not present

## 2017-10-07 DIAGNOSIS — Z6720 Type B blood, Rh positive: Secondary | ICD-10-CM | POA: Diagnosis not present

## 2017-10-07 DIAGNOSIS — C8400 Mycosis fungoides, unspecified site: Secondary | ICD-10-CM | POA: Diagnosis not present

## 2017-10-11 DIAGNOSIS — C8400 Mycosis fungoides, unspecified site: Secondary | ICD-10-CM | POA: Diagnosis not present

## 2017-10-11 DIAGNOSIS — M1611 Unilateral primary osteoarthritis, right hip: Secondary | ICD-10-CM | POA: Diagnosis not present

## 2017-10-11 DIAGNOSIS — W06XXXA Fall from bed, initial encounter: Secondary | ICD-10-CM | POA: Diagnosis not present

## 2017-10-11 DIAGNOSIS — M25551 Pain in right hip: Secondary | ICD-10-CM | POA: Diagnosis not present

## 2017-10-14 DIAGNOSIS — C8400 Mycosis fungoides, unspecified site: Secondary | ICD-10-CM | POA: Diagnosis not present

## 2017-10-14 DIAGNOSIS — M25552 Pain in left hip: Secondary | ICD-10-CM | POA: Diagnosis not present

## 2017-10-20 DIAGNOSIS — K219 Gastro-esophageal reflux disease without esophagitis: Secondary | ICD-10-CM | POA: Diagnosis not present

## 2017-10-20 DIAGNOSIS — E1100 Type 2 diabetes mellitus with hyperosmolarity without nonketotic hyperglycemic-hyperosmolar coma (NKHHC): Secondary | ICD-10-CM | POA: Diagnosis not present

## 2017-10-20 DIAGNOSIS — N400 Enlarged prostate without lower urinary tract symptoms: Secondary | ICD-10-CM | POA: Diagnosis not present

## 2017-10-20 DIAGNOSIS — T380X5A Adverse effect of glucocorticoids and synthetic analogues, initial encounter: Secondary | ICD-10-CM | POA: Diagnosis not present

## 2017-10-20 DIAGNOSIS — T451X5A Adverse effect of antineoplastic and immunosuppressive drugs, initial encounter: Secondary | ICD-10-CM | POA: Diagnosis not present

## 2017-10-20 DIAGNOSIS — G629 Polyneuropathy, unspecified: Secondary | ICD-10-CM | POA: Diagnosis not present

## 2017-10-20 DIAGNOSIS — Z9861 Coronary angioplasty status: Secondary | ICD-10-CM | POA: Diagnosis not present

## 2017-10-20 DIAGNOSIS — K1231 Oral mucositis (ulcerative) due to antineoplastic therapy: Secondary | ICD-10-CM | POA: Diagnosis not present

## 2017-10-20 DIAGNOSIS — C8408 Mycosis fungoides, lymph nodes of multiple sites: Secondary | ICD-10-CM | POA: Diagnosis not present

## 2017-10-20 DIAGNOSIS — E785 Hyperlipidemia, unspecified: Secondary | ICD-10-CM | POA: Diagnosis not present

## 2017-10-20 DIAGNOSIS — I110 Hypertensive heart disease with heart failure: Secondary | ICD-10-CM | POA: Diagnosis not present

## 2017-10-20 DIAGNOSIS — C8400 Mycosis fungoides, unspecified site: Secondary | ICD-10-CM | POA: Diagnosis not present

## 2017-10-20 DIAGNOSIS — J029 Acute pharyngitis, unspecified: Secondary | ICD-10-CM | POA: Diagnosis not present

## 2017-10-20 DIAGNOSIS — I2510 Atherosclerotic heart disease of native coronary artery without angina pectoris: Secondary | ICD-10-CM | POA: Diagnosis not present

## 2017-10-20 DIAGNOSIS — Z5111 Encounter for antineoplastic chemotherapy: Secondary | ICD-10-CM | POA: Diagnosis not present

## 2017-10-20 DIAGNOSIS — Z79899 Other long term (current) drug therapy: Secondary | ICD-10-CM | POA: Diagnosis not present

## 2017-10-20 DIAGNOSIS — M62838 Other muscle spasm: Secondary | ICD-10-CM | POA: Diagnosis not present

## 2017-10-20 DIAGNOSIS — D6481 Anemia due to antineoplastic chemotherapy: Secondary | ICD-10-CM | POA: Diagnosis not present

## 2017-10-20 DIAGNOSIS — E1165 Type 2 diabetes mellitus with hyperglycemia: Secondary | ICD-10-CM | POA: Diagnosis not present

## 2017-10-20 DIAGNOSIS — R739 Hyperglycemia, unspecified: Secondary | ICD-10-CM | POA: Diagnosis not present

## 2017-10-20 DIAGNOSIS — E118 Type 2 diabetes mellitus with unspecified complications: Secondary | ICD-10-CM | POA: Diagnosis not present

## 2017-10-20 DIAGNOSIS — I5032 Chronic diastolic (congestive) heart failure: Secondary | ICD-10-CM | POA: Diagnosis not present

## 2017-10-25 MED ORDER — LORAZEPAM 1 MG PO TABS
1.00 | ORAL_TABLET | ORAL | Status: DC
Start: ? — End: 2017-10-25

## 2017-10-25 MED ORDER — DEXTROSE 50 % IV SOLN
12.50 | INTRAVENOUS | Status: DC
Start: ? — End: 2017-10-25

## 2017-10-25 MED ORDER — TIZANIDINE HCL 2 MG PO TABS
2.00 | ORAL_TABLET | ORAL | Status: DC
Start: 2017-10-25 — End: 2017-10-25

## 2017-10-25 MED ORDER — FIRST-MOUTHWASH BLM MT SUSP
5.00 | OROMUCOSAL | Status: DC
Start: ? — End: 2017-10-25

## 2017-10-25 MED ORDER — INSULIN LISPRO 100 UNIT/ML ~~LOC~~ SOLN
16.00 | SUBCUTANEOUS | Status: DC
Start: 2017-10-25 — End: 2017-10-25

## 2017-10-25 MED ORDER — CARVEDILOL 25 MG PO TABS
25.00 | ORAL_TABLET | ORAL | Status: DC
Start: 2017-10-26 — End: 2017-10-25

## 2017-10-25 MED ORDER — FEXOFENADINE HCL 60 MG PO TABS
60.00 | ORAL_TABLET | ORAL | Status: DC
Start: 2017-10-26 — End: 2017-10-25

## 2017-10-25 MED ORDER — TAMSULOSIN HCL 0.4 MG PO CAPS
0.40 | ORAL_CAPSULE | ORAL | Status: DC
Start: 2017-10-26 — End: 2017-10-25

## 2017-10-25 MED ORDER — GLUCAGON HCL RDNA (DIAGNOSTIC) 1 MG IJ SOLR
1.00 | INTRAMUSCULAR | Status: DC
Start: ? — End: 2017-10-25

## 2017-10-25 MED ORDER — TRAMADOL HCL 50 MG PO TABS
50.00 | ORAL_TABLET | ORAL | Status: DC
Start: ? — End: 2017-10-25

## 2017-10-25 MED ORDER — ACYCLOVIR 200 MG PO CAPS
400.00 | ORAL_CAPSULE | ORAL | Status: DC
Start: 2017-10-25 — End: 2017-10-25

## 2017-10-25 MED ORDER — FOLIC ACID 1 MG PO TABS
1.00 | ORAL_TABLET | ORAL | Status: DC
Start: 2017-10-26 — End: 2017-10-25

## 2017-10-25 MED ORDER — AMITRIPTYLINE HCL 50 MG PO TABS
50.00 | ORAL_TABLET | ORAL | Status: DC
Start: 2017-10-26 — End: 2017-10-25

## 2017-10-25 MED ORDER — GENERIC EXTERNAL MEDICATION
75.00 | Status: DC
Start: 2017-10-25 — End: 2017-10-25

## 2017-10-25 MED ORDER — INSULIN LISPRO 100 UNIT/ML ~~LOC~~ SOLN
0.00 | SUBCUTANEOUS | Status: DC
Start: 2017-10-25 — End: 2017-10-25

## 2017-10-25 MED ORDER — LIME OIL OIL
TOPICAL_OIL | Status: DC
Start: ? — End: 2017-10-25

## 2017-10-25 MED ORDER — PROCHLORPERAZINE EDISYLATE 5 MG/ML IJ SOLN
10.00 | INTRAMUSCULAR | Status: DC
Start: ? — End: 2017-10-25

## 2017-10-25 MED ORDER — INSULIN GLARGINE 100 UNIT/ML ~~LOC~~ SOLN
30.00 | SUBCUTANEOUS | Status: DC
Start: 2017-10-25 — End: 2017-10-25

## 2017-10-25 MED ORDER — APIXABAN 5 MG PO TABS
5.00 | ORAL_TABLET | ORAL | Status: DC
Start: 2017-10-25 — End: 2017-10-25

## 2017-10-25 MED ORDER — MELATONIN 3 MG PO TABS
3.00 | ORAL_TABLET | ORAL | Status: DC
Start: 2017-10-25 — End: 2017-10-25

## 2017-10-25 MED ORDER — PANTOPRAZOLE SODIUM 40 MG PO TBEC
40.00 | DELAYED_RELEASE_TABLET | ORAL | Status: DC
Start: 2017-10-26 — End: 2017-10-25

## 2017-10-25 MED ORDER — INSULIN LISPRO 100 UNIT/ML ~~LOC~~ SOLN
16.00 | SUBCUTANEOUS | Status: DC
Start: 2017-10-26 — End: 2017-10-25

## 2017-10-25 MED ORDER — PROCHLORPERAZINE MALEATE 10 MG PO TABS
10.00 | ORAL_TABLET | ORAL | Status: DC
Start: ? — End: 2017-10-25

## 2017-10-25 MED ORDER — FLUCONAZOLE 200 MG PO TABS
200.00 | ORAL_TABLET | ORAL | Status: DC
Start: 2017-10-26 — End: 2017-10-25

## 2017-10-25 MED ORDER — LORAZEPAM 2 MG/ML IJ SOLN
1.00 | INTRAMUSCULAR | Status: DC
Start: ? — End: 2017-10-25

## 2017-10-25 MED ORDER — HYDROXYZINE HCL 10 MG PO TABS
10.00 | ORAL_TABLET | ORAL | Status: DC
Start: ? — End: 2017-10-25

## 2017-10-26 DIAGNOSIS — C8408 Mycosis fungoides, lymph nodes of multiple sites: Secondary | ICD-10-CM | POA: Diagnosis not present

## 2017-10-28 DIAGNOSIS — C8408 Mycosis fungoides, lymph nodes of multiple sites: Secondary | ICD-10-CM | POA: Diagnosis not present

## 2017-10-31 DIAGNOSIS — C8401 Mycosis fungoides, lymph nodes of head, face, and neck: Secondary | ICD-10-CM | POA: Diagnosis not present

## 2017-10-31 DIAGNOSIS — G546 Phantom limb syndrome with pain: Secondary | ICD-10-CM | POA: Diagnosis not present

## 2017-10-31 DIAGNOSIS — I2510 Atherosclerotic heart disease of native coronary artery without angina pectoris: Secondary | ICD-10-CM | POA: Diagnosis not present

## 2017-10-31 DIAGNOSIS — I5030 Unspecified diastolic (congestive) heart failure: Secondary | ICD-10-CM | POA: Diagnosis not present

## 2017-10-31 DIAGNOSIS — E119 Type 2 diabetes mellitus without complications: Secondary | ICD-10-CM | POA: Diagnosis not present

## 2017-10-31 DIAGNOSIS — E43 Unspecified severe protein-calorie malnutrition: Secondary | ICD-10-CM | POA: Diagnosis not present

## 2017-10-31 DIAGNOSIS — R69 Illness, unspecified: Secondary | ICD-10-CM | POA: Diagnosis not present

## 2017-10-31 DIAGNOSIS — E1140 Type 2 diabetes mellitus with diabetic neuropathy, unspecified: Secondary | ICD-10-CM | POA: Diagnosis not present

## 2017-10-31 DIAGNOSIS — N400 Enlarged prostate without lower urinary tract symptoms: Secondary | ICD-10-CM | POA: Diagnosis not present

## 2017-10-31 DIAGNOSIS — I110 Hypertensive heart disease with heart failure: Secondary | ICD-10-CM | POA: Diagnosis not present

## 2017-10-31 DIAGNOSIS — M109 Gout, unspecified: Secondary | ICD-10-CM | POA: Diagnosis not present

## 2017-10-31 DIAGNOSIS — Z89512 Acquired absence of left leg below knee: Secondary | ICD-10-CM | POA: Diagnosis not present

## 2017-11-01 ENCOUNTER — Telehealth: Payer: Self-pay | Admitting: Family Medicine

## 2017-11-01 DIAGNOSIS — C8400 Mycosis fungoides, unspecified site: Secondary | ICD-10-CM | POA: Diagnosis not present

## 2017-11-01 NOTE — Telephone Encounter (Signed)
Copied from [place] #[id number]. Topic: Inquiry >> Nov 01, 2017  8:19 AM Scherrie Gerlach wrote: Reason for CRM: [name] with [facility] calling for verbal PT orders 2  wk / 4

## 2017-11-01 NOTE — Telephone Encounter (Signed)
Left detailed message on machine for [facility] with verbal orders for PT

## 2017-11-01 NOTE — Telephone Encounter (Signed)
OK 

## 2017-11-02 ENCOUNTER — Telehealth: Payer: Self-pay | Admitting: Family Medicine

## 2017-11-02 DIAGNOSIS — I2510 Atherosclerotic heart disease of native coronary artery without angina pectoris: Secondary | ICD-10-CM | POA: Diagnosis not present

## 2017-11-02 DIAGNOSIS — G546 Phantom limb syndrome with pain: Secondary | ICD-10-CM | POA: Diagnosis not present

## 2017-11-02 DIAGNOSIS — I110 Hypertensive heart disease with heart failure: Secondary | ICD-10-CM | POA: Diagnosis not present

## 2017-11-02 DIAGNOSIS — C8401 Mycosis fungoides, lymph nodes of head, face, and neck: Secondary | ICD-10-CM | POA: Diagnosis not present

## 2017-11-02 DIAGNOSIS — I5030 Unspecified diastolic (congestive) heart failure: Secondary | ICD-10-CM | POA: Diagnosis not present

## 2017-11-02 DIAGNOSIS — Z89512 Acquired absence of left leg below knee: Secondary | ICD-10-CM | POA: Diagnosis not present

## 2017-11-02 DIAGNOSIS — M109 Gout, unspecified: Secondary | ICD-10-CM | POA: Diagnosis not present

## 2017-11-02 DIAGNOSIS — E119 Type 2 diabetes mellitus without complications: Secondary | ICD-10-CM | POA: Diagnosis not present

## 2017-11-02 NOTE — Telephone Encounter (Signed)
OK 

## 2017-11-02 NOTE — Telephone Encounter (Signed)
Copied from [place]. Topic: General - Other >> Nov 02, 2017  3:36 PM Neva Seat wrote: [facility] nurse needing continued orders.  [phone]

## 2017-11-03 NOTE — Telephone Encounter (Signed)
Left detailed message on machine with verbal orders 

## 2017-11-04 DIAGNOSIS — C8400 Mycosis fungoides, unspecified site: Secondary | ICD-10-CM | POA: Diagnosis not present

## 2017-11-05 ENCOUNTER — Telehealth: Payer: Self-pay | Admitting: Family Medicine

## 2017-11-05 DIAGNOSIS — I5030 Unspecified diastolic (congestive) heart failure: Secondary | ICD-10-CM | POA: Diagnosis not present

## 2017-11-05 DIAGNOSIS — C8401 Mycosis fungoides, lymph nodes of head, face, and neck: Secondary | ICD-10-CM | POA: Diagnosis not present

## 2017-11-05 DIAGNOSIS — M109 Gout, unspecified: Secondary | ICD-10-CM | POA: Diagnosis not present

## 2017-11-05 DIAGNOSIS — I110 Hypertensive heart disease with heart failure: Secondary | ICD-10-CM | POA: Diagnosis not present

## 2017-11-05 DIAGNOSIS — R1310 Dysphagia, unspecified: Secondary | ICD-10-CM

## 2017-11-05 DIAGNOSIS — E119 Type 2 diabetes mellitus without complications: Secondary | ICD-10-CM | POA: Diagnosis not present

## 2017-11-05 DIAGNOSIS — Z89512 Acquired absence of left leg below knee: Secondary | ICD-10-CM | POA: Diagnosis not present

## 2017-11-05 DIAGNOSIS — G546 Phantom limb syndrome with pain: Secondary | ICD-10-CM | POA: Diagnosis not present

## 2017-11-05 DIAGNOSIS — I2510 Atherosclerotic heart disease of native coronary artery without angina pectoris: Secondary | ICD-10-CM | POA: Diagnosis not present

## 2017-11-05 NOTE — Telephone Encounter (Signed)
Copied from [place] [id number]. Topic: General - Other >> Nov 05, 2017  2:17 PM Lolita Rieger, [name] wrote: Reason for CRM: Alla Feeling from [facility] called and needs verbal for speech therapy and an order for a modified barium swallow study [phone] is the best number to reach her at

## 2017-11-06 NOTE — Telephone Encounter (Signed)
ok 

## 2017-11-08 DIAGNOSIS — C8408 Mycosis fungoides, lymph nodes of multiple sites: Secondary | ICD-10-CM | POA: Diagnosis not present

## 2017-11-08 NOTE — Telephone Encounter (Signed)
I called Minda and informed her the verbal order was approved by the physician and the order was placed for the study.

## 2017-11-10 DIAGNOSIS — M109 Gout, unspecified: Secondary | ICD-10-CM | POA: Diagnosis not present

## 2017-11-10 DIAGNOSIS — I110 Hypertensive heart disease with heart failure: Secondary | ICD-10-CM | POA: Diagnosis not present

## 2017-11-10 DIAGNOSIS — G546 Phantom limb syndrome with pain: Secondary | ICD-10-CM | POA: Diagnosis not present

## 2017-11-10 DIAGNOSIS — I5030 Unspecified diastolic (congestive) heart failure: Secondary | ICD-10-CM | POA: Diagnosis not present

## 2017-11-10 DIAGNOSIS — C8401 Mycosis fungoides, lymph nodes of head, face, and neck: Secondary | ICD-10-CM | POA: Diagnosis not present

## 2017-11-10 DIAGNOSIS — I2510 Atherosclerotic heart disease of native coronary artery without angina pectoris: Secondary | ICD-10-CM | POA: Diagnosis not present

## 2017-11-10 DIAGNOSIS — Z89512 Acquired absence of left leg below knee: Secondary | ICD-10-CM | POA: Diagnosis not present

## 2017-11-10 DIAGNOSIS — E119 Type 2 diabetes mellitus without complications: Secondary | ICD-10-CM | POA: Diagnosis not present

## 2017-11-11 ENCOUNTER — Telehealth: Payer: Self-pay | Admitting: Cardiovascular Disease

## 2017-11-11 DIAGNOSIS — G546 Phantom limb syndrome with pain: Secondary | ICD-10-CM | POA: Diagnosis not present

## 2017-11-11 DIAGNOSIS — C8401 Mycosis fungoides, lymph nodes of head, face, and neck: Secondary | ICD-10-CM | POA: Diagnosis not present

## 2017-11-11 DIAGNOSIS — M109 Gout, unspecified: Secondary | ICD-10-CM | POA: Diagnosis not present

## 2017-11-11 DIAGNOSIS — I5030 Unspecified diastolic (congestive) heart failure: Secondary | ICD-10-CM | POA: Diagnosis not present

## 2017-11-11 DIAGNOSIS — I110 Hypertensive heart disease with heart failure: Secondary | ICD-10-CM | POA: Diagnosis not present

## 2017-11-11 DIAGNOSIS — Z89512 Acquired absence of left leg below knee: Secondary | ICD-10-CM | POA: Diagnosis not present

## 2017-11-11 DIAGNOSIS — I2510 Atherosclerotic heart disease of native coronary artery without angina pectoris: Secondary | ICD-10-CM | POA: Diagnosis not present

## 2017-11-11 DIAGNOSIS — E119 Type 2 diabetes mellitus without complications: Secondary | ICD-10-CM | POA: Diagnosis not present

## 2017-11-11 NOTE — Telephone Encounter (Signed)
[name] PT ( [facility] ) is calling to give vitals . Eric Price resting heart rate has been running from 110-120 and it is not taking much to get it higher than that . Please call   Thanks

## 2017-11-11 NOTE — Telephone Encounter (Signed)
Eric Price with PT and informed her that patient has a history of elevated HR. Patient is due for an appointment. Eric Price stated that we would need to call the patient about that, that she was no longer at his house. Called patient. Patient stated he is feeling fine, just a little tired. Informed patient that he was over due for an appointment. Dr. Sallyanne Kuster has an opening tomorrow morning.  Patient stated he could come in tomorrow. Made patient an appointment for tomorrow. Patient verbalized understanding.

## 2017-11-12 ENCOUNTER — Encounter: Payer: Self-pay | Admitting: Cardiovascular Disease

## 2017-11-12 ENCOUNTER — Other Ambulatory Visit (HOSPITAL_COMMUNITY): Payer: Self-pay | Admitting: Family Medicine

## 2017-11-12 ENCOUNTER — Ambulatory Visit: Payer: Medicare HMO | Admitting: Cardiovascular Disease

## 2017-11-12 VITALS — BP 116/76 | HR 110 | Ht 69.0 in | Wt 212.0 lb

## 2017-11-12 DIAGNOSIS — Z9189 Other specified personal risk factors, not elsewhere classified: Secondary | ICD-10-CM | POA: Diagnosis not present

## 2017-11-12 DIAGNOSIS — Z95810 Presence of automatic (implantable) cardiac defibrillator: Secondary | ICD-10-CM | POA: Diagnosis not present

## 2017-11-12 DIAGNOSIS — I10 Essential (primary) hypertension: Secondary | ICD-10-CM

## 2017-11-12 DIAGNOSIS — I5042 Chronic combined systolic (congestive) and diastolic (congestive) heart failure: Secondary | ICD-10-CM | POA: Diagnosis not present

## 2017-11-12 DIAGNOSIS — I4729 Other ventricular tachycardia: Secondary | ICD-10-CM

## 2017-11-12 DIAGNOSIS — I739 Peripheral vascular disease, unspecified: Secondary | ICD-10-CM

## 2017-11-12 DIAGNOSIS — C8403 Mycosis fungoides, intra-abdominal lymph nodes: Secondary | ICD-10-CM

## 2017-11-12 DIAGNOSIS — Z794 Long term (current) use of insulin: Secondary | ICD-10-CM

## 2017-11-12 DIAGNOSIS — I472 Ventricular tachycardia: Secondary | ICD-10-CM

## 2017-11-12 DIAGNOSIS — E1151 Type 2 diabetes mellitus with diabetic peripheral angiopathy without gangrene: Secondary | ICD-10-CM | POA: Diagnosis not present

## 2017-11-12 DIAGNOSIS — R1310 Dysphagia, unspecified: Secondary | ICD-10-CM

## 2017-11-12 DIAGNOSIS — I2510 Atherosclerotic heart disease of native coronary artery without angina pectoris: Secondary | ICD-10-CM

## 2017-11-12 DIAGNOSIS — E782 Mixed hyperlipidemia: Secondary | ICD-10-CM | POA: Diagnosis not present

## 2017-11-12 MED ORDER — PANTOPRAZOLE SODIUM 40 MG PO TBEC: 40.0000 mg | DELAYED_RELEASE_TABLET | Freq: Every day | ORAL | 3 refills | Status: DC

## 2017-11-12 MED ORDER — SPIRONOLACTONE 25 MG PO TABS: 25.0000 mg | ORAL_TABLET | Freq: Every day | ORAL | 3 refills | Status: DC

## 2017-11-12 MED ORDER — FUROSEMIDE 80 MG PO TABS: 80.0000 mg | ORAL_TABLET | Freq: Every day | ORAL | 3 refills | Status: DC

## 2017-11-12 NOTE — Patient Instructions (Signed)
Dr Sallyanne Kuster has recommended making the following medication changes: 1. INCREASE Furosemide (Lasix) to 80 mg daily 2. START Spironolactone 25 mg - take 1 tablet by mouth daily  Your physician recommends that you return for lab work in 2 weeks on the same day you see [name], [name].  Remote monitoring is used to monitor your Pacemaker or ICD from home. This monitoring reduces the number of office visits required to check your device to one time per year. It allows [place] to keep an eye on the functioning of your device to ensure it is working properly. You are scheduled for a device check from home on Friday, May 10th, 2019. You may send your transmission at any time that day. If you have a wireless device, the transmission will be sent automatically. After your physician reviews your transmission, you will receive a notification with your next transmission date.  Your physician recommends that you schedule a follow-up appointment in 2 weeks with Kerin Ransom, PA.  Dr Sallyanne Kuster recommends that you schedule a follow-up appointment in 12 months with an ICD check. You will receive a reminder letter in the mail two months in advance. If you don't receive a letter, please call our office to schedule the follow-up appointment.  If you need a refill on your cardiac medications before your next appointment, please call your pharmacy.
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
## 2017-11-15 ENCOUNTER — Ambulatory Visit: Payer: Medicare HMO | Admitting: Podiatry

## 2017-11-15 ENCOUNTER — Encounter: Payer: Self-pay | Admitting: Podiatry

## 2017-11-15 ENCOUNTER — Telehealth: Payer: Self-pay

## 2017-11-15 DIAGNOSIS — L600 Ingrowing nail: Secondary | ICD-10-CM | POA: Diagnosis not present

## 2017-11-15 DIAGNOSIS — L84 Corns and callosities: Secondary | ICD-10-CM

## 2017-11-15 DIAGNOSIS — I739 Peripheral vascular disease, unspecified: Secondary | ICD-10-CM | POA: Diagnosis not present

## 2017-11-15 NOTE — Telephone Encounter (Signed)
-----   Message from [name], MD sent at 11/12/2017  5:16 PM EST ----- Lost to f/u for a year (second time in 2 years...). Says his transmitter is hooked up. Please troubleshoot. Has CHF an recent repeated HF exacernbation. Please enrol in CHF device follow up. Thanks [employer]

## 2017-11-15 NOTE — Progress Notes (Signed)
Subjective:   Patient ID: Eric Price, male   DOB: 67 y.o.   MRN: [id number]   HPI Patient in poor health with skin cancer of the malignant nature being treated currently with nail disease 1-5 right and history of loss of left leg secondary to PVD with long-term diabetes   ROS      Objective:  Physical Exam  High risk patient with nail damage right hallux history of digital deformity history of severe PVD with loss of left leg and long-term diabetes     Assessment:  Reviewed condition of at risk patient     Plan:  Discussed condition at this point I have recommended long-term diabetic shoe for the right and he was casted for this.  We will get approval for this and I debrided nailbeds right instructed on soaks discussed ingrown toenail the right big toe and patient will take careful care of this and if any issues were to occur he will reappoint immediately.  He is due for a PET scan in approximately 4 weeks and hopefully will be cancer free at that time

## 2017-11-15 NOTE — Telephone Encounter (Signed)
Spoke with pt, pt agreeable to [facility] clinic pts monitor has not updated since Oct 2018, the monitor pt has is a SMART but in carelink he has a [id number] monitor, unable to change serial number/ type of monitor in carelink website. Request made for carelink to contact pt to update monitor info and help pt set up carelink SMART, pt to call back once [employer] contacts him, direct number to device clinic given.

## 2017-11-15 NOTE — Patient Instructions (Addendum)

## 2017-11-16 DIAGNOSIS — G546 Phantom limb syndrome with pain: Secondary | ICD-10-CM | POA: Diagnosis not present

## 2017-11-16 DIAGNOSIS — I110 Hypertensive heart disease with heart failure: Secondary | ICD-10-CM | POA: Diagnosis not present

## 2017-11-16 DIAGNOSIS — I2510 Atherosclerotic heart disease of native coronary artery without angina pectoris: Secondary | ICD-10-CM | POA: Diagnosis not present

## 2017-11-16 DIAGNOSIS — E119 Type 2 diabetes mellitus without complications: Secondary | ICD-10-CM | POA: Diagnosis not present

## 2017-11-16 DIAGNOSIS — Z89512 Acquired absence of left leg below knee: Secondary | ICD-10-CM | POA: Diagnosis not present

## 2017-11-16 DIAGNOSIS — C8401 Mycosis fungoides, lymph nodes of head, face, and neck: Secondary | ICD-10-CM | POA: Diagnosis not present

## 2017-11-16 DIAGNOSIS — M109 Gout, unspecified: Secondary | ICD-10-CM | POA: Diagnosis not present

## 2017-11-16 DIAGNOSIS — I5030 Unspecified diastolic (congestive) heart failure: Secondary | ICD-10-CM | POA: Diagnosis not present

## 2017-11-17 ENCOUNTER — Ambulatory Visit (HOSPITAL_COMMUNITY)
Admission: RE | Admit: 2017-11-17 | Discharge: 2017-11-17 | Disposition: A | Discharge status: Home / Self Care | Payer: Medicare HMO | Source: Ambulatory Visit | Attending: Family Medicine | Admitting: Family Medicine

## 2017-11-17 DIAGNOSIS — F329 Major depressive disorder, single episode, unspecified: Secondary | ICD-10-CM | POA: Insufficient documentation

## 2017-11-17 DIAGNOSIS — E1151 Type 2 diabetes mellitus with diabetic peripheral angiopathy without gangrene: Secondary | ICD-10-CM | POA: Insufficient documentation

## 2017-11-17 DIAGNOSIS — I5042 Chronic combined systolic (congestive) and diastolic (congestive) heart failure: Secondary | ICD-10-CM | POA: Diagnosis not present

## 2017-11-17 DIAGNOSIS — E785 Hyperlipidemia, unspecified: Secondary | ICD-10-CM | POA: Insufficient documentation

## 2017-11-17 DIAGNOSIS — I5030 Unspecified diastolic (congestive) heart failure: Secondary | ICD-10-CM | POA: Diagnosis not present

## 2017-11-17 DIAGNOSIS — Z89512 Acquired absence of left leg below knee: Secondary | ICD-10-CM | POA: Diagnosis not present

## 2017-11-17 DIAGNOSIS — I110 Hypertensive heart disease with heart failure: Secondary | ICD-10-CM | POA: Insufficient documentation

## 2017-11-17 DIAGNOSIS — E119 Type 2 diabetes mellitus without complications: Secondary | ICD-10-CM | POA: Diagnosis not present

## 2017-11-17 DIAGNOSIS — M109 Gout, unspecified: Secondary | ICD-10-CM | POA: Diagnosis not present

## 2017-11-17 DIAGNOSIS — I428 Other cardiomyopathies: Secondary | ICD-10-CM | POA: Insufficient documentation

## 2017-11-17 DIAGNOSIS — C8401 Mycosis fungoides, lymph nodes of head, face, and neck: Secondary | ICD-10-CM | POA: Diagnosis not present

## 2017-11-17 DIAGNOSIS — R1310 Dysphagia, unspecified: Secondary | ICD-10-CM | POA: Insufficient documentation

## 2017-11-17 DIAGNOSIS — R69 Illness, unspecified: Secondary | ICD-10-CM | POA: Diagnosis not present

## 2017-11-17 DIAGNOSIS — G546 Phantom limb syndrome with pain: Secondary | ICD-10-CM | POA: Diagnosis not present

## 2017-11-17 DIAGNOSIS — I2510 Atherosclerotic heart disease of native coronary artery without angina pectoris: Secondary | ICD-10-CM | POA: Diagnosis not present

## 2017-11-17 DIAGNOSIS — R1312 Dysphagia, oropharyngeal phase: Secondary | ICD-10-CM | POA: Insufficient documentation

## 2017-11-17 NOTE — Progress Notes (Signed)
Modified Barium Swallow Progress Note  Patient Details  Name: Eric Price MRN: [id number] Date of Birth: 02-24-1951  Today's Date: 11/17/2017  Modified Barium Swallow completed.  Full report located under Chart Review in the Imaging Section.  Brief recommendations include the following:  Clinical Impression    Pt presents with overtly functional swallow, as indicated by lack of observed aspiration/penetration despite thorough challenging and minimal oral/pharyngeal residue. Pt had premature spillage with mixed consistencies; however, liquid spilled into the vallecula and then swallowed without airway compromise. Pt had minimal oral deficits secondary to mild lingual weakness, but pt was able to clear oral cavity with intermittent secondary swallows independently. During pill trial, pill remained in the vallecula, but cleared when given applesauce. Pt had a prominent cricopharyngeus, not impacting swallowing safety or bolus flow. Recommend continued diet of regular solids and thin liquids as tolerated, maintaining aspiration precautions (slow/small bites/sips). Recommend administering pills whole in applesauce. Defer further intervention to follow-up SLP.   Swallow Evaluation Recommendations       SLP Diet Recommendations: Regular solids;Thin liquid(chopped per preference)   Liquid Administration via: Cup;Straw   Medication Administration: Whole meds with puree   Supervision: Patient able to self feed   Compensations: Slow rate;Small sips/bites   Postural Changes: Seated upright at 90 degrees     [place] Channing Savich SLP Student Clinician     [place] Zarianna Dicarlo SLP Student Clinician   [place] Kelty Szafran 11/17/2017,12:51 PM

## 2017-11-18 DIAGNOSIS — I110 Hypertensive heart disease with heart failure: Secondary | ICD-10-CM | POA: Diagnosis not present

## 2017-11-18 DIAGNOSIS — Z89512 Acquired absence of left leg below knee: Secondary | ICD-10-CM | POA: Diagnosis not present

## 2017-11-18 DIAGNOSIS — M109 Gout, unspecified: Secondary | ICD-10-CM | POA: Diagnosis not present

## 2017-11-18 DIAGNOSIS — E119 Type 2 diabetes mellitus without complications: Secondary | ICD-10-CM | POA: Diagnosis not present

## 2017-11-18 DIAGNOSIS — G546 Phantom limb syndrome with pain: Secondary | ICD-10-CM | POA: Diagnosis not present

## 2017-11-18 DIAGNOSIS — C8401 Mycosis fungoides, lymph nodes of head, face, and neck: Secondary | ICD-10-CM | POA: Diagnosis not present

## 2017-11-18 DIAGNOSIS — I5030 Unspecified diastolic (congestive) heart failure: Secondary | ICD-10-CM | POA: Diagnosis not present

## 2017-11-18 DIAGNOSIS — I2510 Atherosclerotic heart disease of native coronary artery without angina pectoris: Secondary | ICD-10-CM | POA: Diagnosis not present

## 2017-11-19 DIAGNOSIS — I110 Hypertensive heart disease with heart failure: Secondary | ICD-10-CM | POA: Diagnosis not present

## 2017-11-19 DIAGNOSIS — C8401 Mycosis fungoides, lymph nodes of head, face, and neck: Secondary | ICD-10-CM | POA: Diagnosis not present

## 2017-11-19 DIAGNOSIS — E119 Type 2 diabetes mellitus without complications: Secondary | ICD-10-CM | POA: Diagnosis not present

## 2017-11-19 DIAGNOSIS — I2510 Atherosclerotic heart disease of native coronary artery without angina pectoris: Secondary | ICD-10-CM | POA: Diagnosis not present

## 2017-11-19 DIAGNOSIS — M109 Gout, unspecified: Secondary | ICD-10-CM | POA: Diagnosis not present

## 2017-11-19 DIAGNOSIS — G546 Phantom limb syndrome with pain: Secondary | ICD-10-CM | POA: Diagnosis not present

## 2017-11-19 DIAGNOSIS — I5030 Unspecified diastolic (congestive) heart failure: Secondary | ICD-10-CM | POA: Diagnosis not present

## 2017-11-19 DIAGNOSIS — Z89512 Acquired absence of left leg below knee: Secondary | ICD-10-CM | POA: Diagnosis not present

## 2017-11-22 ENCOUNTER — Other Ambulatory Visit (HOSPITAL_COMMUNITY): Payer: Self-pay | Admitting: Family Medicine

## 2017-11-22 DIAGNOSIS — C8401 Mycosis fungoides, lymph nodes of head, face, and neck: Secondary | ICD-10-CM | POA: Diagnosis not present

## 2017-11-22 DIAGNOSIS — G546 Phantom limb syndrome with pain: Secondary | ICD-10-CM | POA: Diagnosis not present

## 2017-11-22 DIAGNOSIS — M109 Gout, unspecified: Secondary | ICD-10-CM | POA: Diagnosis not present

## 2017-11-22 DIAGNOSIS — E119 Type 2 diabetes mellitus without complications: Secondary | ICD-10-CM | POA: Diagnosis not present

## 2017-11-22 DIAGNOSIS — R1310 Dysphagia, unspecified: Secondary | ICD-10-CM

## 2017-11-22 DIAGNOSIS — I110 Hypertensive heart disease with heart failure: Secondary | ICD-10-CM | POA: Diagnosis not present

## 2017-11-22 DIAGNOSIS — I5030 Unspecified diastolic (congestive) heart failure: Secondary | ICD-10-CM | POA: Diagnosis not present

## 2017-11-22 DIAGNOSIS — I2510 Atherosclerotic heart disease of native coronary artery without angina pectoris: Secondary | ICD-10-CM | POA: Diagnosis not present

## 2017-11-22 DIAGNOSIS — Z89512 Acquired absence of left leg below knee: Secondary | ICD-10-CM | POA: Diagnosis not present

## 2017-11-23 DIAGNOSIS — E119 Type 2 diabetes mellitus without complications: Secondary | ICD-10-CM | POA: Diagnosis not present

## 2017-11-23 DIAGNOSIS — C8401 Mycosis fungoides, lymph nodes of head, face, and neck: Secondary | ICD-10-CM | POA: Diagnosis not present

## 2017-11-23 DIAGNOSIS — I5030 Unspecified diastolic (congestive) heart failure: Secondary | ICD-10-CM | POA: Diagnosis not present

## 2017-11-23 DIAGNOSIS — I110 Hypertensive heart disease with heart failure: Secondary | ICD-10-CM | POA: Diagnosis not present

## 2017-11-23 DIAGNOSIS — I5042 Chronic combined systolic (congestive) and diastolic (congestive) heart failure: Secondary | ICD-10-CM | POA: Diagnosis not present

## 2017-11-23 DIAGNOSIS — G546 Phantom limb syndrome with pain: Secondary | ICD-10-CM | POA: Diagnosis not present

## 2017-11-23 DIAGNOSIS — I2510 Atherosclerotic heart disease of native coronary artery without angina pectoris: Secondary | ICD-10-CM | POA: Diagnosis not present

## 2017-11-23 DIAGNOSIS — M109 Gout, unspecified: Secondary | ICD-10-CM | POA: Diagnosis not present

## 2017-11-23 DIAGNOSIS — Z89512 Acquired absence of left leg below knee: Secondary | ICD-10-CM | POA: Diagnosis not present

## 2017-11-24 LAB — CUP PACEART INCLINIC DEVICE CHECK
Implantable Lead Location: 753860
Lead Channel Setting Pacing Amplitude: 2.5 V
MDC IDC LEAD IMPLANT DT: 20161122
MDC IDC PG IMPLANT DT: 20161122
MDC IDC SESS DTM: 20190220181204
MDC IDC SET LEADCHNL RV PACING PULSEWIDTH: 0.4 ms
MDC IDC SET LEADCHNL RV SENSING SENSITIVITY: 0.3 mV

## 2017-11-24 LAB — BASIC METABOLIC PANEL
BUN/Creatinine Ratio: 24 (ref 10–24)
BUN: 29 mg/dL — ABNORMAL HIGH (ref 8–27)
CALCIUM: 10.4 mg/dL — AB (ref 8.6–10.2)
CHLORIDE: 95 mmol/L — AB (ref 96–106)
CO2: 27 mmol/L (ref 20–29)
Creatinine, Ser: 1.19 mg/dL (ref 0.76–1.27)
GFR calc Af Amer: 73 mL/min/{1.73_m2} (ref >59)
GFR calc non Af Amer: 63 mL/min/{1.73_m2} (ref >59)
GLUCOSE: 58 mg/dL — AB (ref 65–99)
POTASSIUM: 4.3 mmol/L (ref 3.5–5.2)
SODIUM: 140 mmol/L (ref 134–144)

## 2017-11-25 ENCOUNTER — Encounter: Payer: Self-pay | Admitting: Cardiology

## 2017-11-25 ENCOUNTER — Ambulatory Visit: Payer: Medicare HMO | Admitting: Cardiology

## 2017-11-25 ENCOUNTER — Telehealth: Payer: Self-pay | Admitting: *Deleted

## 2017-11-25 VITALS — BP 133/86 | HR 114 | Ht 69.0 in | Wt 206.4 lb

## 2017-11-25 DIAGNOSIS — I5030 Unspecified diastolic (congestive) heart failure: Secondary | ICD-10-CM | POA: Diagnosis not present

## 2017-11-25 DIAGNOSIS — I5042 Chronic combined systolic (congestive) and diastolic (congestive) heart failure: Secondary | ICD-10-CM | POA: Diagnosis not present

## 2017-11-25 DIAGNOSIS — Z95810 Presence of automatic (implantable) cardiac defibrillator: Secondary | ICD-10-CM

## 2017-11-25 DIAGNOSIS — I10 Essential (primary) hypertension: Secondary | ICD-10-CM

## 2017-11-25 DIAGNOSIS — Z79899 Other long term (current) drug therapy: Secondary | ICD-10-CM | POA: Diagnosis not present

## 2017-11-25 DIAGNOSIS — Z89512 Acquired absence of left leg below knee: Secondary | ICD-10-CM | POA: Diagnosis not present

## 2017-11-25 DIAGNOSIS — C8401 Mycosis fungoides, lymph nodes of head, face, and neck: Secondary | ICD-10-CM | POA: Diagnosis not present

## 2017-11-25 DIAGNOSIS — I2510 Atherosclerotic heart disease of native coronary artery without angina pectoris: Secondary | ICD-10-CM

## 2017-11-25 DIAGNOSIS — E119 Type 2 diabetes mellitus without complications: Secondary | ICD-10-CM | POA: Diagnosis not present

## 2017-11-25 DIAGNOSIS — I428 Other cardiomyopathies: Secondary | ICD-10-CM

## 2017-11-25 DIAGNOSIS — I5043 Acute on chronic combined systolic (congestive) and diastolic (congestive) heart failure: Secondary | ICD-10-CM

## 2017-11-25 DIAGNOSIS — M109 Gout, unspecified: Secondary | ICD-10-CM | POA: Diagnosis not present

## 2017-11-25 DIAGNOSIS — C8400 Mycosis fungoides, unspecified site: Secondary | ICD-10-CM

## 2017-11-25 DIAGNOSIS — I110 Hypertensive heart disease with heart failure: Secondary | ICD-10-CM | POA: Diagnosis not present

## 2017-11-25 DIAGNOSIS — G546 Phantom limb syndrome with pain: Secondary | ICD-10-CM | POA: Diagnosis not present

## 2017-11-25 NOTE — Assessment & Plan Note (Signed)
Lasix increased, Aldactone added- his wgt is coming down.

## 2017-11-25 NOTE — Assessment & Plan Note (Signed)
Followed at [place] scan next month

## 2017-11-25 NOTE — Assessment & Plan Note (Signed)
Minor CAD- 75% dRCA 2016

## 2017-11-25 NOTE — Telephone Encounter (Signed)
Prior auth for Hydroxyzine Pamoate 25mg  capsules sent to [email or web address].

## 2017-11-25 NOTE — Assessment & Plan Note (Signed)
Controlled.  

## 2017-11-25 NOTE — Assessment & Plan Note (Signed)
Nonischemic cardiomyopathy- EF 30-35% May 2016, no change on medical Rx Oct 2016 Most recent echo from [facility] 05/18/17- EF 55%

## 2017-11-25 NOTE — Assessment & Plan Note (Signed)
MDT ICD Nov 2016- Dr Sallyanne Kuster

## 2017-11-25 NOTE — Patient Instructions (Signed)
Medication Instructions:  Continue current medications  If you need a refill on your cardiac medications before your next appointment, please call your pharmacy.  Labwork: BMP Today HERE IN OUR OFFICE AT [facility]  Take the provided lab slips for you to take with you to the lab for you blood draw.   You will NOT need to fast   Testing/Procedures: None Ordered   Follow-Up: Your physician wants you to follow-up in: 4 Weeks with Dr Sallyanne Kuster or Jana Half.    Thank you for choosing [facility] at [facility]!!

## 2017-11-25 NOTE — Progress Notes (Signed)
11/25/2017 Eric Price   01/31/1951  [id number]  Primary Physician Burchette, Alinda Sierras, MD Primary Cardiologist: Dr Sallyanne Kuster  HPI:  67 y/o male with a history of DM, s/p Lt BKA, HTN, NICM, and mycosis fungoides. He is being followed at [facility] and has being treated with chemotherapy and radiation. He has a history of NICM. His EF in May 2016 was 30-35%. F/U echo in Oct 2016 was unchanged and he underwent MDT ICD implant Nov 2016. Cath May 2016 showed 75% dRCA. He last saw Dr Sallyanne Kuster 11/12/17 and was felt to be volume overloaded. His Lasix was increased to 80 mg and Aldactone 25 mg added. Dr Sallyanne Kuster felt he was about 10 lbs over his dry weight. He is in the office today for follow up. He has last 6 lbs and I think he is doing better symptomatically, his wife confirms this.    Current Outpatient Medications  Medication Sig Dispense Refill  . albuterol (PROVENTIL HFA;VENTOLIN HFA) 108 (90 Base) MCG/ACT inhaler Inhale 2 puffs into the lungs every 4 (four) hours as needed for wheezing or shortness of breath. 1 Inhaler 2  . atorvastatin (LIPITOR) 20 MG tablet TAKE 1 TABLET(20 MG) BY MOUTH DAILY AT 6 PM 90 tablet 1  . bexarotene (TARGRETIN) 75 MG CAPS capsule Take 150 mg/m2 by mouth daily with breakfast. Give with food. Protect from light. CAUTION: Chemotherapy/Biotherapy    . buPROPion (WELLBUTRIN XL) 150 MG 24 hr tablet TAKE 1 TABLET(150 MG) BY MOUTH DAILY 90 tablet 1  . carvedilol (COREG) 25 MG tablet Take 1.5 tablets (37.5 mg total) by mouth 2 (two) times daily with a meal. 135 tablet 3  . clobetasol cream (TEMOVATE) 3.90 % APPLY 1 APPLICATION TOPICALLY TWICE DAILY 30 g 2  . DOCQLACE 100 MG capsule TAKE 1 CAPSULE BY MOUTH DAILY (Patient taking differently: TAKE 1 CAPSULE BY MOUTH DAILY AS NEEDED) 30 capsule 0  . Dulaglutide (TRULICITY) 1.5 ZE/0.9QZ SOPN INJECT CONTENTS OF ONE PEN UNDER THE SKIN ONCE A WEEK 12 pen 3  . ELIQUIS 5 MG TABS tablet TAKE 1 TABLET BY MOUTH TWICE DAILY 60 tablet 5   . fenofibrate 54 MG tablet Take 1 tablet (54 mg total) by mouth daily. Pt. Not instructed to take currently 30 tablet 11  . folic acid (FOLVITE) 1 MG tablet Take 1 tablet (1 mg total) by mouth daily.    . furosemide (LASIX) 80 MG tablet Take 1 tablet (80 mg total) by mouth daily. 90 tablet 3  . gabapentin (NEURONTIN) 600 MG tablet Take 1 tablet (600 mg total) by mouth 4 (four) times daily. 360 tablet 1  . glimepiride (AMARYL) 2 MG tablet TAKE 1 TABLET(2 MG) BY MOUTH DAILY WITH BREAKFAST 90 tablet 0  . glucagon 1 MG injection Inject 1 mg into the vein 2 (two) times daily.    Marland Kitchen glucose blood (ONETOUCH VERIO) test strip USE TO CHECK BLOOD SUGAR TWICE DAILY 200 each 3  . hydrOXYzine (ATARAX/VISTARIL) 25 MG tablet TAKE 1- 3 TABLETS BY MOUTH TWICE DAILY AS NEEDED FOR ITCHING 180 tablet 0  . insulin aspart (NOVOLOG FLEXPEN) 100 UNIT/ML FlexPen Inject 34-40 units before meals, 3x a day 30 mL 5  . Insulin Detemir (LEVEMIR) 100 UNIT/ML Pen Inject 50 Units into the skin 2 (two) times daily. 30 mL 3  . lisinopril (PRINIVIL,ZESTRIL) 2.5 MG tablet Take 2 tablets (5 mg total) by mouth daily. 180 tablet 3  . metFORMIN (GLUCOPHAGE) 1000 MG tablet Take 1 tablet (1,000 mg  total) by mouth 2 (two) times daily with a meal. 180 tablet 3  . methotrexate (RHEUMATREX) 2.5 MG tablet Use as directed per [facility]. Titrated up eventually to 6 per day. 4 tablet 0  . nitroGLYCERIN (NITROSTAT) 0.4 MG SL tablet Place 1 tablet (0.4 mg total) under the tongue every 5 (five) minutes as needed for chest pain. 60 tablet 12  . ondansetron (ZOFRAN) 8 MG tablet   3  . ONE TOUCH LANCETS MISC Check 2 times daily. 100 each 3  . pantoprazole (PROTONIX) 40 MG tablet Take 1 tablet (40 mg total) by mouth daily. 90 tablet 3  . Pregabalin (LYRICA PO) Take 75 mg by mouth 2 (two) times daily.     . promethazine (PHENERGAN) 12.5 MG tablet Take 12.5 mg by mouth every 6 (six) hours as needed for nausea or vomiting.    Marland Kitchen spironolactone (ALDACTONE) 25 MG  tablet Take 1 tablet (25 mg total) by mouth daily. 90 tablet 3  . tiZANidine (ZANAFLEX) 2 MG tablet TAKE 1 TABLET(2 MG) BY MOUTH AT BEDTIME AS NEEDED FOR MUSCLE SPASMS 90 tablet 1  . triamcinolone cream (KENALOG) 0.1 % Apply 1 application topically 2 (two) times daily.      No current facility-administered medications for this visit.     Allergies  Allergen Reactions  . Morphine Shortness Of Breath and Anaphylaxis  . Morphine And Related     "took my breath away"  . Oxycodone     Pt stated, "It makes me climbs walls; fight wars"    Past Medical History:  Diagnosis Date  . Arthritis    knees, shoulder, hands   . Chronic combined systolic and diastolic CHF (congestive heart failure) ([place]) 07/17/2016  . Coronary artery disease   . Critical lower limb ischemia   . Depression   . Hyperlipidemia   . Hypertension   . 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   . Nonischemic cardiomyopathy ([place])   . Peripheral vascular disease ([place])   . Pneumonia 2013   hosp. - MCH x1 week   . Shortness of breath dyspnea    related to pain currently  . Sleep apnea    10-20 yrs. ago, states he used CPAP, not needed anymore.   . Type II diabetes mellitus ([place])     Social History   Socioeconomic History  . Marital status: Married    Spouse name: Not on file  . Number of children: 2  . Years of education: Not on file  . Highest education level: Not on file  Social Needs  . Financial resource strain: Not on file  . Food insecurity - worry: Not on file  . Food insecurity - inability: Not on file  . Transportation needs - medical: Not on file  . Transportation needs - non-medical: Not on file  Occupational History    Comment: upholster.   Tobacco Use  . Smoking status: Never Smoker  . Smokeless tobacco: Never Used  Substance and Sexual Activity  . Alcohol use: No    Alcohol/week: 0.0 oz  . Drug use: No  . Sexual activity: Not Currently  Other  Topics Concern  . Not on file  Social History Narrative   Lives with wife.      Family History  Problem Relation Age of Onset  . CAD Father 43       Died 49  . Hypertension Father   . Heart failure Mother 25  .  Diabetes Maternal Grandmother   . CAD Paternal Grandfather 51  . Colon cancer Neg Hx   . Esophageal cancer Neg Hx   . Stomach cancer Neg Hx   . Rectal cancer Neg Hx      Review of Systems: General: negative for chills, fever, night sweats or weight changes.  Cardiovascular: negative for chest pain, dyspnea on exertion, edema, orthopnea, palpitations, paroxysmal nocturnal dyspnea or shortness of breath Dermatological: negative for rash Respiratory: negative for cough or wheezing Urologic: negative for hematuria Abdominal: negative for nausea, vomiting, diarrhea, bright red blood per rectum, melena, or hematemesis Neurologic: negative for visual changes, syncope, or dizziness All other systems reviewed and are otherwise negative except as noted above.    Blood pressure 133/86, pulse (!) 114, height _0  (1.753 m), weight 206 lb 6.4 oz (93.6 kg).  General appearance: alert, cooperative and no distress Neck: no carotid bruit and no JVD Lungs: clear to auscultation bilaterally Heart: regular rate and rhythm Extremities: Lt BKA Neurologic: Grossly normal   ASSESSMENT AND PLAN:   Acute on chronic combined systolic and diastolic CHF (congestive heart failure) (HCC) Lasix increased, Aldactone added- his wgt is coming down.  NICM (nonischemic cardiomyopathy) ([place]) Nonischemic cardiomyopathy- EF 30-35% May 2016, no change on medical Rx Oct 2016 Most recent echo from [facility] 05/18/17- EF 55%  Mycosis fungoides Followed at [facility]- PET scan next month  ICD (implantable cardioverter-defibrillator) in place MDT ICD Nov 2016- Dr Croitoru  Essential hypertension Controlled  CAD (coronary artery disease) Minor CAD- 75% dRCA 2016   PLAN  Check BMP, if his wgt gets to 200  lbs I would 1/2 his Lasix and Aldactone. F/U after his PET scan at [facility] next month.   Kerin Ransom PA-C 11/25/2017 11:54 AM

## 2017-11-26 DIAGNOSIS — M109 Gout, unspecified: Secondary | ICD-10-CM | POA: Diagnosis not present

## 2017-11-26 DIAGNOSIS — Z89512 Acquired absence of left leg below knee: Secondary | ICD-10-CM | POA: Diagnosis not present

## 2017-11-26 DIAGNOSIS — E119 Type 2 diabetes mellitus without complications: Secondary | ICD-10-CM | POA: Diagnosis not present

## 2017-11-26 DIAGNOSIS — I2510 Atherosclerotic heart disease of native coronary artery without angina pectoris: Secondary | ICD-10-CM | POA: Diagnosis not present

## 2017-11-26 DIAGNOSIS — I110 Hypertensive heart disease with heart failure: Secondary | ICD-10-CM | POA: Diagnosis not present

## 2017-11-26 DIAGNOSIS — C8401 Mycosis fungoides, lymph nodes of head, face, and neck: Secondary | ICD-10-CM | POA: Diagnosis not present

## 2017-11-26 DIAGNOSIS — I5030 Unspecified diastolic (congestive) heart failure: Secondary | ICD-10-CM | POA: Diagnosis not present

## 2017-11-26 DIAGNOSIS — G546 Phantom limb syndrome with pain: Secondary | ICD-10-CM | POA: Diagnosis not present

## 2017-11-26 LAB — BASIC METABOLIC PANEL
BUN/Creatinine Ratio: 21 (ref 10–24)
BUN: 23 mg/dL (ref 8–27)
CO2: 21 mmol/L (ref 20–29)
Calcium: 9.8 mg/dL (ref 8.6–10.2)
Chloride: 99 mmol/L (ref 96–106)
Creatinine, Ser: 1.1 mg/dL (ref 0.76–1.27)
GFR calc Af Amer: 80 mL/min/{1.73_m2} (ref >59)
GFR calc non Af Amer: 69 mL/min/{1.73_m2} (ref >59)
Glucose: 218 mg/dL — ABNORMAL HIGH (ref 65–99)
Potassium: 4.8 mmol/L (ref 3.5–5.2)
Sodium: 138 mmol/L (ref 134–144)

## 2017-11-26 NOTE — Telephone Encounter (Signed)
Fax received from [facility] stating the Rx was approved from 10/03/2017-10/04/2018.  I called [facility] and informed  Catalina Antigua of this.

## 2017-11-29 ENCOUNTER — Ambulatory Visit (HOSPITAL_COMMUNITY): Payer: Medicare HMO

## 2017-11-29 ENCOUNTER — Ambulatory Visit (HOSPITAL_COMMUNITY)
Admission: RE | Admit: 2017-11-29 | Discharge: 2017-11-29 | Disposition: A | Discharge status: Home / Self Care | Payer: Medicare HMO | Source: Ambulatory Visit | Attending: Family Medicine | Admitting: Family Medicine

## 2017-11-29 DIAGNOSIS — C8401 Mycosis fungoides, lymph nodes of head, face, and neck: Secondary | ICD-10-CM | POA: Diagnosis not present

## 2017-11-29 DIAGNOSIS — G546 Phantom limb syndrome with pain: Secondary | ICD-10-CM | POA: Diagnosis not present

## 2017-11-29 DIAGNOSIS — R1310 Dysphagia, unspecified: Secondary | ICD-10-CM

## 2017-11-29 DIAGNOSIS — I110 Hypertensive heart disease with heart failure: Secondary | ICD-10-CM | POA: Diagnosis not present

## 2017-11-29 DIAGNOSIS — I2510 Atherosclerotic heart disease of native coronary artery without angina pectoris: Secondary | ICD-10-CM | POA: Diagnosis not present

## 2017-11-29 DIAGNOSIS — E119 Type 2 diabetes mellitus without complications: Secondary | ICD-10-CM | POA: Diagnosis not present

## 2017-11-29 DIAGNOSIS — I5030 Unspecified diastolic (congestive) heart failure: Secondary | ICD-10-CM | POA: Diagnosis not present

## 2017-11-29 DIAGNOSIS — M109 Gout, unspecified: Secondary | ICD-10-CM | POA: Diagnosis not present

## 2017-11-29 DIAGNOSIS — Z89512 Acquired absence of left leg below knee: Secondary | ICD-10-CM | POA: Diagnosis not present

## 2017-11-30 DIAGNOSIS — Z89512 Acquired absence of left leg below knee: Secondary | ICD-10-CM | POA: Diagnosis not present

## 2017-11-30 DIAGNOSIS — M109 Gout, unspecified: Secondary | ICD-10-CM | POA: Diagnosis not present

## 2017-11-30 DIAGNOSIS — G546 Phantom limb syndrome with pain: Secondary | ICD-10-CM | POA: Diagnosis not present

## 2017-11-30 DIAGNOSIS — I110 Hypertensive heart disease with heart failure: Secondary | ICD-10-CM | POA: Diagnosis not present

## 2017-11-30 DIAGNOSIS — I5030 Unspecified diastolic (congestive) heart failure: Secondary | ICD-10-CM | POA: Diagnosis not present

## 2017-11-30 DIAGNOSIS — E119 Type 2 diabetes mellitus without complications: Secondary | ICD-10-CM | POA: Diagnosis not present

## 2017-11-30 DIAGNOSIS — C8401 Mycosis fungoides, lymph nodes of head, face, and neck: Secondary | ICD-10-CM | POA: Diagnosis not present

## 2017-11-30 DIAGNOSIS — I2510 Atherosclerotic heart disease of native coronary artery without angina pectoris: Secondary | ICD-10-CM | POA: Diagnosis not present

## 2017-12-01 ENCOUNTER — Telehealth: Payer: Self-pay

## 2017-12-01 DIAGNOSIS — C8401 Mycosis fungoides, lymph nodes of head, face, and neck: Secondary | ICD-10-CM | POA: Diagnosis not present

## 2017-12-01 DIAGNOSIS — I5030 Unspecified diastolic (congestive) heart failure: Secondary | ICD-10-CM | POA: Diagnosis not present

## 2017-12-01 DIAGNOSIS — Z89512 Acquired absence of left leg below knee: Secondary | ICD-10-CM | POA: Diagnosis not present

## 2017-12-01 DIAGNOSIS — I110 Hypertensive heart disease with heart failure: Secondary | ICD-10-CM | POA: Diagnosis not present

## 2017-12-01 DIAGNOSIS — G546 Phantom limb syndrome with pain: Secondary | ICD-10-CM | POA: Diagnosis not present

## 2017-12-01 DIAGNOSIS — E119 Type 2 diabetes mellitus without complications: Secondary | ICD-10-CM | POA: Diagnosis not present

## 2017-12-01 DIAGNOSIS — M109 Gout, unspecified: Secondary | ICD-10-CM | POA: Diagnosis not present

## 2017-12-01 DIAGNOSIS — I2510 Atherosclerotic heart disease of native coronary artery without angina pectoris: Secondary | ICD-10-CM | POA: Diagnosis not present

## 2017-12-01 NOTE — Telephone Encounter (Signed)
Opened in error

## 2017-12-03 ENCOUNTER — Telehealth: Payer: Self-pay

## 2017-12-03 NOTE — Telephone Encounter (Signed)
Referred to [facility] clinic by Dr Sallyanne Kuster.  Intro call made to patient and he agreed to monthly calls. Advised the monitor status is currently disconnected.  Attempted to troubleshoot monitor but patient is saying he has a monitor that is done through the phone app.  I explained the [place] website has a different monitor listed.  Provided [employer] number and explained they will be able to assist him with the monitor. Will call back next week to check on monitor status.

## 2017-12-06 ENCOUNTER — Telehealth: Payer: Self-pay | Admitting: Family Medicine

## 2017-12-06 DIAGNOSIS — C8401 Mycosis fungoides, lymph nodes of head, face, and neck: Secondary | ICD-10-CM | POA: Diagnosis not present

## 2017-12-06 DIAGNOSIS — E119 Type 2 diabetes mellitus without complications: Secondary | ICD-10-CM | POA: Diagnosis not present

## 2017-12-06 DIAGNOSIS — I110 Hypertensive heart disease with heart failure: Secondary | ICD-10-CM | POA: Diagnosis not present

## 2017-12-06 DIAGNOSIS — I2510 Atherosclerotic heart disease of native coronary artery without angina pectoris: Secondary | ICD-10-CM | POA: Diagnosis not present

## 2017-12-06 DIAGNOSIS — G546 Phantom limb syndrome with pain: Secondary | ICD-10-CM | POA: Diagnosis not present

## 2017-12-06 DIAGNOSIS — I5030 Unspecified diastolic (congestive) heart failure: Secondary | ICD-10-CM | POA: Diagnosis not present

## 2017-12-06 DIAGNOSIS — Z89512 Acquired absence of left leg below knee: Secondary | ICD-10-CM | POA: Diagnosis not present

## 2017-12-06 DIAGNOSIS — M109 Gout, unspecified: Secondary | ICD-10-CM | POA: Diagnosis not present

## 2017-12-06 NOTE — Telephone Encounter (Signed)
Copied from [place] [id number]. Topic: General - Other >> Dec 06, 2017 11:08 AM Valla Leaver wrote: Reason for CRM: Kyra Searles, PT w/ [facility], calling for verbal orders for PT w/ frequency of 2x a wk for 3wks.

## 2017-12-06 NOTE — Telephone Encounter (Signed)
Verbal orders given to Kendra.  

## 2017-12-06 NOTE — Telephone Encounter (Signed)
ok 

## 2017-12-07 ENCOUNTER — Telehealth: Payer: Self-pay

## 2017-12-07 NOTE — Telephone Encounter (Signed)
ICM call to patient and wife regarding monitor status.  I provided [employer] number on 12/03/2017 but wife stated she was unable to get through to talk with someone.  Advised I would contact [employer] and call her back.

## 2017-12-07 NOTE — Telephone Encounter (Signed)
Call back and spoke with patient.  Advised I submitted an online request for [employer] company to call them within 1-2 business days to assist with monitor set up.  He said best time to reach him is in the morning.  Confirmed contact numbers.

## 2017-12-08 DIAGNOSIS — C8408 Mycosis fungoides, lymph nodes of multiple sites: Secondary | ICD-10-CM | POA: Diagnosis not present

## 2017-12-08 DIAGNOSIS — C8400 Mycosis fungoides, unspecified site: Secondary | ICD-10-CM | POA: Diagnosis not present

## 2017-12-10 ENCOUNTER — Telehealth: Payer: Self-pay | Admitting: Family Medicine

## 2017-12-10 MED ORDER — APIXABAN 5 MG PO TABS: 5.0000 mg | ORAL_TABLET | Freq: Two times a day (BID) | ORAL | 5 refills | Status: DC

## 2017-12-10 NOTE — Telephone Encounter (Signed)
Copied from [place]. Topic: Quick Communication - Rx Refill/Question >> Dec 10, 2017  4:36 PM Percell Belt A wrote: Medication: ELIQUIS 5 MG TABS tablet [[id number] Has the patient contacted their pharmacy? No    (Agent: If no, request that the patient contact the pharmacy for the refill.)   Preferred Pharmacy (with phone number or street name): [facility] on [address]    Agent: Please be advised that RX refills may take up to 3 business days. We ask that you follow-up with your pharmacy.

## 2017-12-13 ENCOUNTER — Other Ambulatory Visit: Payer: Self-pay | Admitting: Family Medicine

## 2017-12-15 ENCOUNTER — Other Ambulatory Visit: Payer: Self-pay | Admitting: Family Medicine

## 2017-12-16 ENCOUNTER — Encounter: Payer: Self-pay | Admitting: Cardiology

## 2017-12-16 ENCOUNTER — Other Ambulatory Visit: Payer: Medicare HMO | Admitting: Orthotics

## 2017-12-16 ENCOUNTER — Ambulatory Visit (INDEPENDENT_AMBULATORY_CARE_PROVIDER_SITE_OTHER): Payer: Medicare HMO

## 2017-12-16 ENCOUNTER — Ambulatory Visit: Payer: Medicare HMO | Admitting: Cardiology

## 2017-12-16 ENCOUNTER — Telehealth: Payer: Self-pay

## 2017-12-16 VITALS — BP 112/82 | HR 104 | Ht 69.0 in | Wt 197.0 lb

## 2017-12-16 DIAGNOSIS — Z95810 Presence of automatic (implantable) cardiac defibrillator: Secondary | ICD-10-CM

## 2017-12-16 DIAGNOSIS — C8400 Mycosis fungoides, unspecified site: Secondary | ICD-10-CM

## 2017-12-16 DIAGNOSIS — R0609 Other forms of dyspnea: Secondary | ICD-10-CM

## 2017-12-16 DIAGNOSIS — Z89512 Acquired absence of left leg below knee: Secondary | ICD-10-CM

## 2017-12-16 DIAGNOSIS — I428 Other cardiomyopathies: Secondary | ICD-10-CM

## 2017-12-16 DIAGNOSIS — I5042 Chronic combined systolic (congestive) and diastolic (congestive) heart failure: Secondary | ICD-10-CM | POA: Diagnosis not present

## 2017-12-16 DIAGNOSIS — I2510 Atherosclerotic heart disease of native coronary artery without angina pectoris: Secondary | ICD-10-CM | POA: Diagnosis not present

## 2017-12-16 DIAGNOSIS — R0602 Shortness of breath: Secondary | ICD-10-CM | POA: Diagnosis not present

## 2017-12-16 DIAGNOSIS — R0600 Dyspnea, unspecified: Secondary | ICD-10-CM

## 2017-12-16 DIAGNOSIS — I10 Essential (primary) hypertension: Secondary | ICD-10-CM

## 2017-12-16 MED ORDER — CARVEDILOL 25 MG PO TABS: 25.0000 mg | ORAL_TABLET | Freq: Two times a day (BID) | ORAL | 3 refills | Status: DC

## 2017-12-16 NOTE — Patient Instructions (Signed)
Medication Instructions:  DECREASE Coreg 25 mg take 1 tablet twice a day  Labwork: None   Testing/Procedures: Your physician has requested that you have a lexiscan myoview. For further information please visit [email or web address]. Please follow instruction sheet, as given.  Follow-Up: Your physician recommends that you schedule a follow-up appointment in: 6 wks with Dr Gwenlyn Found.  Any Other Special Instructions Will Be Listed Below (If Applicable).  If you need a refill on your cardiac medications before your next appointment, please call your pharmacy.

## 2017-12-16 NOTE — Assessment & Plan Note (Addendum)
Unclear etiology- ? deconditioning, ? Medications, ? anginal equivalent

## 2017-12-16 NOTE — Assessment & Plan Note (Signed)
Nonischemic cardiomyopathy- EF 30-35% May 2016, no change on medical Rx Oct 2016 Most recent echo from [facility] 05/18/17- EF 55%

## 2017-12-16 NOTE — Assessment & Plan Note (Signed)
Controlled.  

## 2017-12-16 NOTE — Progress Notes (Signed)
12/16/2017 Eric Price   01/31/1951  [id number]  Primary Physician Burchette, Alinda Sierras, MD Primary Cardiologist: Dr Gwenlyn Found  HPI:  67 y/o male with a history of DM, s/p Lt BKA, HTN, NICM, andmycosis fungoides. He is being followed at [facility] being treated with chemotherapy and radiation. He has a history of NICM. His EF in May 2016 was 30-35%. F/U echo in Oct 2016 was unchanged and he underwent MDT ICD implant Nov 2016. Cath May 2016 showed 75% dRCA. He saw Dr Sallyanne Kuster 11/12/17 and was felt to be volume overloaded. His Lasix was increased to 80 mg and Aldactone 25 mg added. The pt had a f/u echo at [facility] in Oct 2018 that showed his EF to be 55%. CTA was negative for PE. Recent volume check on his device suggest he his volume status in normal. Hgb done a week ago was stable-11.9. He is in the office today for follow up. His main complaint to me is exertional fatigue and dyspnea. He denies chest pain. He denies orthopnea, in fact he feel better laying down. O2 sat at rest was 95%. I had him walk around the office- O2 sat 94%. His HR is 100-110.   Current Outpatient Medications  Medication Sig Dispense Refill  . albuterol (PROVENTIL HFA;VENTOLIN HFA) 108 (90 Base) MCG/ACT inhaler Inhale 2 puffs into the lungs every 4 (four) hours as needed for wheezing or shortness of breath. 1 Inhaler 2  . apixaban (ELIQUIS) 5 MG TABS tablet Take 1 tablet (5 mg total) by mouth 2 (two) times daily. 60 tablet 5  . atorvastatin (LIPITOR) 20 MG tablet TAKE 1 TABLET(20 MG) BY MOUTH DAILY AT 6 PM 90 tablet 0  . buPROPion (WELLBUTRIN XL) 150 MG 24 hr tablet TAKE 1 TABLET(150 MG) BY MOUTH DAILY 90 tablet 1  . carvedilol (COREG) 25 MG tablet Take 1 tablet (25 mg total) by mouth 2 (two) times daily with a meal. 60 tablet 3  . clobetasol cream (TEMOVATE) 8.40 % APPLY 1 APPLICATION TOPICALLY TWICE DAILY 30 g 2  . DOCQLACE 100 MG capsule TAKE 1 CAPSULE BY MOUTH DAILY (Patient taking differently: TAKE 1 CAPSULE BY  MOUTH DAILY AS NEEDED) 30 capsule 0  . Dulaglutide (TRULICITY) 1.5 RF/5.4HK SOPN INJECT CONTENTS OF ONE PEN UNDER THE SKIN ONCE A WEEK 12 pen 3  . fenofibrate 54 MG tablet Take 1 tablet (54 mg total) by mouth daily. Pt. Not instructed to take currently 30 tablet 11  . folic acid (FOLVITE) 1 MG tablet Take 1 tablet (1 mg total) by mouth daily.    . furosemide (LASIX) 80 MG tablet Take 1 tablet (80 mg total) by mouth daily. 90 tablet 3  . glimepiride (AMARYL) 2 MG tablet TAKE 1 TABLET(2 MG) BY MOUTH DAILY WITH BREAKFAST 90 tablet 0  . glucagon 1 MG injection Inject 1 mg into the vein 2 (two) times daily.    Marland Kitchen glucose blood (ONETOUCH VERIO) test strip USE TO CHECK BLOOD SUGAR TWICE DAILY 200 each 3  . hydrOXYzine (ATARAX/VISTARIL) 25 MG tablet TAKE 1- 3 TABLETS BY MOUTH TWICE DAILY AS NEEDED FOR ITCHING 180 tablet 0  . hydrOXYzine (VISTARIL) 25 MG capsule TAKE 1 CAPSULE(25 MG) BY MOUTH TWICE DAILY AS NEEDED FOR ITCHING 30 capsule 3  . insulin aspart (NOVOLOG FLEXPEN) 100 UNIT/ML FlexPen Inject 34-40 units before meals, 3x a day 30 mL 5  . Insulin Detemir (LEVEMIR) 100 UNIT/ML Pen Inject 50 Units into the skin 2 (two) times daily.  30 mL 3  . lisinopril (PRINIVIL,ZESTRIL) 2.5 MG tablet Take 2 tablets (5 mg total) by mouth daily. 180 tablet 3  . metFORMIN (GLUCOPHAGE) 1000 MG tablet Take 1 tablet (1,000 mg total) by mouth 2 (two) times daily with a meal. 180 tablet 3  . methotrexate (RHEUMATREX) 2.5 MG tablet Use as directed per [facility]. Titrated up eventually to 6 per day. 4 tablet 0  . nitroGLYCERIN (NITROSTAT) 0.4 MG SL tablet Place 1 tablet (0.4 mg total) under the tongue every 5 (five) minutes as needed for chest pain. 60 tablet 12  . ondansetron (ZOFRAN) 8 MG tablet   3  . ONE TOUCH LANCETS MISC Check 2 times daily. 100 each 3  . pantoprazole (PROTONIX) 40 MG tablet Take 1 tablet (40 mg total) by mouth daily. 90 tablet 3  . Pregabalin (LYRICA PO) Take 75 mg by mouth 2 (two) times daily.     .  promethazine (PHENERGAN) 12.5 MG tablet Take 12.5 mg by mouth every 6 (six) hours as needed for nausea or vomiting.    Marland Kitchen spironolactone (ALDACTONE) 25 MG tablet Take 1 tablet (25 mg total) by mouth daily. 90 tablet 3  . tiZANidine (ZANAFLEX) 2 MG tablet TAKE 1 TABLET(2 MG) BY MOUTH AT BEDTIME AS NEEDED FOR MUSCLE SPASMS 90 tablet 1  . triamcinolone cream (KENALOG) 0.1 % Apply 1 application topically 2 (two) times daily.      No current facility-administered medications for this visit.     Allergies  Allergen Reactions  . Morphine Shortness Of Breath and Anaphylaxis  . Morphine And Related     "took my breath away"  . Oxycodone     Pt stated, "It makes me climbs walls; fight wars"    Past Medical History:  Diagnosis Date  . Arthritis    knees, shoulder, hands   . Chronic combined systolic and diastolic CHF (congestive heart failure) ([place]) 07/17/2016  . Coronary artery disease   . Critical lower limb ischemia   . Depression   . Hyperlipidemia   . Hypertension   . 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   . Nonischemic cardiomyopathy ([place])   . Peripheral vascular disease ([place])   . Pneumonia 2013   hosp. - MCH x1 week   . Shortness of breath dyspnea    related to pain currently  . Sleep apnea    10-20 yrs. ago, states he used CPAP, not needed anymore.   . Type II diabetes mellitus ([place])     Social History   Socioeconomic History  . Marital status: Married    Spouse name: Not on file  . Number of children: 2  . Years of education: Not on file  . Highest education level: Not on file  Social Needs  . Financial resource strain: Not on file  . Food insecurity - worry: Not on file  . Food insecurity - inability: Not on file  . Transportation needs - medical: Not on file  . Transportation needs - non-medical: Not on file  Occupational History    Comment: upholster.   Tobacco Use  . Smoking status: Never Smoker  . Smokeless  tobacco: Never Used  Substance and Sexual Activity  . Alcohol use: No    Alcohol/week: 0.0 oz  . Drug use: No  . Sexual activity: Not Currently  Other Topics Concern  . Not on file  Social History Narrative   Lives with wife.  Family History  Problem Relation Age of Onset  . CAD Father 6       Died 40  . Hypertension Father   . Heart failure Mother 35  . Diabetes Maternal Grandmother   . CAD Paternal Grandfather 85  . Colon cancer Neg Hx   . Esophageal cancer Neg Hx   . Stomach cancer Neg Hx   . Rectal cancer Neg Hx      Review of Systems: General: negative for chills, fever, night sweats or weight changes.  Cardiovascular: negative for chest pain, dyspnea on exertion, edema, orthopnea, palpitations, paroxysmal nocturnal dyspnea or shortness of breath Dermatological: negative for rash Respiratory: negative for cough or wheezing Urologic: negative for hematuria Abdominal: negative for nausea, vomiting, diarrhea, bright red blood per rectum, melena, or hematemesis Neurologic: negative for visual changes, syncope, or dizziness All other systems reviewed and are otherwise negative except as noted above.    Blood pressure 112/82, pulse (!) 104, height 5' 9" (1.753 m), weight 197 lb (89.4 kg), SpO2 95 %.  General appearance: alert, cooperative and no distress Neck: no carotid bruit and no JVD Lungs: clear to auscultation bilaterally Heart: regular rate and rhythm Skin: Skin color, texture, turgor normal. No rashes or lesions Neurologic: Grossly normal  EKG [id number]  ASSESSMENT AND PLAN:   Dyspnea Unclear etiology- ? deconditioning, ? Medications, ? anginal equivalent  NICM (nonischemic cardiomyopathy) ([place]) Nonischemic cardiomyopathy- EF 30-35% May 2016, no change on medical Rx Oct 2016 Most recent echo [facility] 05/18/17- EF 55%  History of left below knee amputation [facility]) S/p Lt BKA may 2016  Mycosis fungoides Followed at [place]  hypertension Controlled  CAD (coronary artery disease) Minor CAD- 75% dRCA 2016   PLAN  Check Lexiscan Myoview. I suggested he try cutting Coreg back to 25 mg BID to see if he didn't feel better. F/U with Dr Gwenlyn Found after [facility].   Kerin Ransom PA-C 12/16/2017 4:37 PM

## 2017-12-16 NOTE — Assessment & Plan Note (Signed)
S/p Lt BKA may 2016

## 2017-12-16 NOTE — Telephone Encounter (Signed)
Remote ICM transmission received.  Attempted call to patient and left detailed message per DPR regarding transmission and next ICM scheduled for 01/17/2018.  Advised to return call for any fluid symptoms or questions.    

## 2017-12-16 NOTE — Assessment & Plan Note (Signed)
Followed at [facility] 

## 2017-12-16 NOTE — Assessment & Plan Note (Signed)
Minor CAD- 75% dRCA 2016

## 2017-12-16 NOTE — Progress Notes (Signed)
EPIC Encounter for ICM Monitoring  Patient Name: Eric Price is a 67 y.o. male Date: 12/16/2017 Primary Care Physican: [name], MD Primary Cardiologist: Croitoru Electrophysiologist: Croitoru Dry Weight: 206 lbs office weight       1st ICM remote.  Attempted call to patient. Left detailed message and provided ICM direct number for return call.  Transmission reviewed.   Thoracic impedance normal since 11/15/2017.   Prescribed dosage: Furosemide 80 mg take 1 tablet daily  Labs: 11/25/2017 Creatinine 1.10, BUN 23, Potassium 4.8, Sodium 138, EGFR 69-80 11/23/2017 Creatinine 1.19, BUN 29, Potassium 4.3, Sodium 140, EGFR 63-73  Recommendations: Left voice mail with ICM number and encouraged to call if experiencing any fluid symptoms.  Follow-up plan: ICM clinic phone appointment on 01/17/2018.  Office appointment scheduled 12/16/2017 with Kerin Ransom, PA.  Copy of ICM check sent to Dr. Sallyanne Kuster and Kerin Ransom, [name].   3 month ICM trend: 12/16/2017    1 Year ICM trend:       Rosalene Billings, RN 12/16/2017 9:08 AM

## 2017-12-21 DIAGNOSIS — G546 Phantom limb syndrome with pain: Secondary | ICD-10-CM | POA: Diagnosis not present

## 2017-12-21 DIAGNOSIS — I110 Hypertensive heart disease with heart failure: Secondary | ICD-10-CM | POA: Diagnosis not present

## 2017-12-21 DIAGNOSIS — M109 Gout, unspecified: Secondary | ICD-10-CM | POA: Diagnosis not present

## 2017-12-21 DIAGNOSIS — Z89512 Acquired absence of left leg below knee: Secondary | ICD-10-CM | POA: Diagnosis not present

## 2017-12-21 DIAGNOSIS — I5030 Unspecified diastolic (congestive) heart failure: Secondary | ICD-10-CM | POA: Diagnosis not present

## 2017-12-21 DIAGNOSIS — E119 Type 2 diabetes mellitus without complications: Secondary | ICD-10-CM | POA: Diagnosis not present

## 2017-12-21 DIAGNOSIS — C8401 Mycosis fungoides, lymph nodes of head, face, and neck: Secondary | ICD-10-CM | POA: Diagnosis not present

## 2017-12-21 DIAGNOSIS — I2510 Atherosclerotic heart disease of native coronary artery without angina pectoris: Secondary | ICD-10-CM | POA: Diagnosis not present

## 2017-12-22 ENCOUNTER — Telehealth (HOSPITAL_COMMUNITY): Payer: Self-pay

## 2017-12-22 NOTE — Telephone Encounter (Signed)
Encounter complete. 

## 2017-12-23 DIAGNOSIS — I2510 Atherosclerotic heart disease of native coronary artery without angina pectoris: Secondary | ICD-10-CM | POA: Diagnosis not present

## 2017-12-23 DIAGNOSIS — Z89512 Acquired absence of left leg below knee: Secondary | ICD-10-CM | POA: Diagnosis not present

## 2017-12-23 DIAGNOSIS — M109 Gout, unspecified: Secondary | ICD-10-CM | POA: Diagnosis not present

## 2017-12-23 DIAGNOSIS — G546 Phantom limb syndrome with pain: Secondary | ICD-10-CM | POA: Diagnosis not present

## 2017-12-23 DIAGNOSIS — C8401 Mycosis fungoides, lymph nodes of head, face, and neck: Secondary | ICD-10-CM | POA: Diagnosis not present

## 2017-12-23 DIAGNOSIS — I5030 Unspecified diastolic (congestive) heart failure: Secondary | ICD-10-CM | POA: Diagnosis not present

## 2017-12-23 DIAGNOSIS — E119 Type 2 diabetes mellitus without complications: Secondary | ICD-10-CM | POA: Diagnosis not present

## 2017-12-23 DIAGNOSIS — I110 Hypertensive heart disease with heart failure: Secondary | ICD-10-CM | POA: Diagnosis not present

## 2017-12-24 ENCOUNTER — Ambulatory Visit (HOSPITAL_COMMUNITY)
Admission: RE | Admit: 2017-12-24 | Discharge: 2017-12-24 | Disposition: A | Discharge status: Home / Self Care | Payer: Medicare HMO | Source: Ambulatory Visit | Attending: Cardiovascular Disease | Admitting: Cardiovascular Disease

## 2017-12-24 ENCOUNTER — Telehealth: Payer: Self-pay | Admitting: Cardiology

## 2017-12-24 DIAGNOSIS — I2510 Atherosclerotic heart disease of native coronary artery without angina pectoris: Secondary | ICD-10-CM | POA: Diagnosis not present

## 2017-12-24 DIAGNOSIS — Z9181 History of falling: Secondary | ICD-10-CM | POA: Insufficient documentation

## 2017-12-24 DIAGNOSIS — J40 Bronchitis, not specified as acute or chronic: Secondary | ICD-10-CM | POA: Insufficient documentation

## 2017-12-24 DIAGNOSIS — Z807 Family history of other malignant neoplasms of lymphoid, hematopoietic and related tissues: Secondary | ICD-10-CM | POA: Diagnosis not present

## 2017-12-24 DIAGNOSIS — Z8249 Family history of ischemic heart disease and other diseases of the circulatory system: Secondary | ICD-10-CM | POA: Insufficient documentation

## 2017-12-24 DIAGNOSIS — I429 Cardiomyopathy, unspecified: Secondary | ICD-10-CM | POA: Diagnosis not present

## 2017-12-24 DIAGNOSIS — Z89512 Acquired absence of left leg below knee: Secondary | ICD-10-CM | POA: Insufficient documentation

## 2017-12-24 DIAGNOSIS — I472 Ventricular tachycardia: Secondary | ICD-10-CM | POA: Diagnosis not present

## 2017-12-24 DIAGNOSIS — E119 Type 2 diabetes mellitus without complications: Secondary | ICD-10-CM | POA: Diagnosis not present

## 2017-12-24 DIAGNOSIS — I739 Peripheral vascular disease, unspecified: Secondary | ICD-10-CM | POA: Diagnosis not present

## 2017-12-24 DIAGNOSIS — R0602 Shortness of breath: Secondary | ICD-10-CM | POA: Diagnosis not present

## 2017-12-24 LAB — MYOCARDIAL PERFUSION IMAGING
LV dias vol: 115 mL (ref 62–150)
LV sys vol: 78 mL
Peak HR: 116 {beats}/min
Rest HR: 93 {beats}/min
SDS: 2
SRS: 7
SSS: 9
TID: 1.1

## 2017-12-24 MED ORDER — TECHNETIUM TC 99M TETROFOSMIN IV KIT
30.8000 | PACK | Freq: Once | INTRAVENOUS | Status: AC | PRN
  Administered 2017-12-24: 30.8 via INTRAVENOUS
  Filled 2017-12-24: qty 31

## 2017-12-24 MED ORDER — REGADENOSON 0.4 MG/5ML IV SOLN
0.4000 mg | Freq: Once | INTRAVENOUS | Status: AC
Start: 2017-12-24 — End: 2017-12-24
  Administered 2017-12-24: 0.4 mg via INTRAVENOUS

## 2017-12-24 MED ORDER — TECHNETIUM TC 99M TETROFOSMIN IV KIT
10.7000 | PACK | Freq: Once | INTRAVENOUS | Status: AC | PRN
  Administered 2017-12-24: 10.7 via INTRAVENOUS
  Filled 2017-12-24: qty 11

## 2017-12-24 MED ORDER — AMINOPHYLLINE 25 MG/ML IV SOLN
75.0000 mg | Freq: Once | INTRAVENOUS | Status: AC
Start: 2017-12-24 — End: 2017-12-24
  Administered 2017-12-24: 75 mg via INTRAVENOUS

## 2017-12-24 NOTE — Telephone Encounter (Signed)
-----   Message from Blenda Bridegroom sent at 12/24/2017  1:59 PM EDT ----- Regarding: High Risk Nuclear study High Risk Nuclear study read by Dr Johnsie Cancel on December 24, 2017.This test was ordered by Kerin Ransom, PA

## 2017-12-24 NOTE — Telephone Encounter (Signed)
This test result has been resulted on by [facility] and is awaiting Dr. Gwenlyn Found review  Result Notes for MYOCARDIAL PERFUSION IMAGING   Notes recorded by Erlene Quan, PA-C on 12/24/2017 at 2:10 PM EDT Dr Gwenlyn Found can you review my office note and this Myoview. ? Consider cath  Kerin Ransom PA-C 12/24/2017 2:10 PM

## 2017-12-24 NOTE — Telephone Encounter (Signed)
Routing to Dr. Gwenlyn Found to advise.

## 2017-12-27 ENCOUNTER — Telehealth: Payer: Self-pay | Admitting: Family Medicine

## 2017-12-27 NOTE — Telephone Encounter (Signed)
Copied from [place]. Topic: Quick Communication - See Telephone Encounter >> Dec 27, 2017 11:50 AM Boyd Kerbs wrote: CRM for notification.   [facility] told him this prescription is back ordered and asking if can call in a different prescription.  hydrOXYzine (VISTARIL) 25 MG capsule  See Telephone encounter for: 12/27/17.

## 2017-12-27 NOTE — Telephone Encounter (Signed)
Left detailed message on machine for [facility] with verbal orders.

## 2017-12-27 NOTE — Telephone Encounter (Signed)
Copied from [place] [phone]. Topic: Quick Communication - See Telephone Encounter >> Dec 27, 2017  3:12 PM Lolita Rieger, [name] wrote: CRM for notification. See Telephone encounter for: 12/27/17.[name] from [facility] called needing verbals orders to extend [facility] PT 1 times week for 1 week ,2 times week for 2 weeks and 1 times week 3 weeks please call [phone]

## 2017-12-27 NOTE — Telephone Encounter (Signed)
OK 

## 2017-12-27 NOTE — Telephone Encounter (Signed)
I have responded to [facility].

## 2017-12-28 ENCOUNTER — Ambulatory Visit: Payer: Medicare HMO | Admitting: Orthotics

## 2017-12-28 DIAGNOSIS — I110 Hypertensive heart disease with heart failure: Secondary | ICD-10-CM | POA: Diagnosis not present

## 2017-12-28 DIAGNOSIS — I2510 Atherosclerotic heart disease of native coronary artery without angina pectoris: Secondary | ICD-10-CM | POA: Diagnosis not present

## 2017-12-28 DIAGNOSIS — E1140 Type 2 diabetes mellitus with diabetic neuropathy, unspecified: Secondary | ICD-10-CM | POA: Diagnosis not present

## 2017-12-28 DIAGNOSIS — M109 Gout, unspecified: Secondary | ICD-10-CM | POA: Diagnosis not present

## 2017-12-28 DIAGNOSIS — N400 Enlarged prostate without lower urinary tract symptoms: Secondary | ICD-10-CM | POA: Diagnosis not present

## 2017-12-28 DIAGNOSIS — E43 Unspecified severe protein-calorie malnutrition: Secondary | ICD-10-CM | POA: Diagnosis not present

## 2017-12-28 DIAGNOSIS — I5030 Unspecified diastolic (congestive) heart failure: Secondary | ICD-10-CM | POA: Diagnosis not present

## 2017-12-28 DIAGNOSIS — R69 Illness, unspecified: Secondary | ICD-10-CM | POA: Diagnosis not present

## 2017-12-28 DIAGNOSIS — G546 Phantom limb syndrome with pain: Secondary | ICD-10-CM | POA: Diagnosis not present

## 2017-12-28 DIAGNOSIS — C8401 Mycosis fungoides, lymph nodes of head, face, and neck: Secondary | ICD-10-CM | POA: Diagnosis not present

## 2017-12-28 NOTE — Telephone Encounter (Signed)
I would have him try OTC Zyrtec 10 mg once daily.

## 2017-12-28 NOTE — Telephone Encounter (Signed)
Left message on machine for patient to return our call.  CRM created 

## 2017-12-29 DIAGNOSIS — Z923 Personal history of irradiation: Secondary | ICD-10-CM | POA: Diagnosis not present

## 2017-12-29 DIAGNOSIS — C8409 Mycosis fungoides, extranodal and solid organ sites: Secondary | ICD-10-CM | POA: Diagnosis not present

## 2017-12-29 DIAGNOSIS — Z9221 Personal history of antineoplastic chemotherapy: Secondary | ICD-10-CM | POA: Diagnosis not present

## 2017-12-29 DIAGNOSIS — K0889 Other specified disorders of teeth and supporting structures: Secondary | ICD-10-CM | POA: Diagnosis not present

## 2017-12-29 DIAGNOSIS — C8400 Mycosis fungoides, unspecified site: Secondary | ICD-10-CM | POA: Diagnosis not present

## 2017-12-30 ENCOUNTER — Ambulatory Visit: Payer: Medicare HMO | Admitting: Orthotics

## 2017-12-30 DIAGNOSIS — B351 Tinea unguium: Secondary | ICD-10-CM

## 2017-12-30 DIAGNOSIS — I739 Peripheral vascular disease, unspecified: Secondary | ICD-10-CM

## 2017-12-30 NOTE — Progress Notes (Signed)
Reordered shoes ortho 520 10W

## 2017-12-30 NOTE — Telephone Encounter (Signed)
I left a detailed message with the information below at the pts home number. 

## 2018-01-01 DIAGNOSIS — R69 Illness, unspecified: Secondary | ICD-10-CM | POA: Diagnosis not present

## 2018-01-04 DIAGNOSIS — I110 Hypertensive heart disease with heart failure: Secondary | ICD-10-CM | POA: Diagnosis not present

## 2018-01-04 DIAGNOSIS — M109 Gout, unspecified: Secondary | ICD-10-CM | POA: Diagnosis not present

## 2018-01-04 DIAGNOSIS — I2510 Atherosclerotic heart disease of native coronary artery without angina pectoris: Secondary | ICD-10-CM | POA: Diagnosis not present

## 2018-01-04 DIAGNOSIS — R69 Illness, unspecified: Secondary | ICD-10-CM | POA: Diagnosis not present

## 2018-01-04 DIAGNOSIS — N400 Enlarged prostate without lower urinary tract symptoms: Secondary | ICD-10-CM | POA: Diagnosis not present

## 2018-01-04 DIAGNOSIS — C8401 Mycosis fungoides, lymph nodes of head, face, and neck: Secondary | ICD-10-CM | POA: Diagnosis not present

## 2018-01-04 DIAGNOSIS — E43 Unspecified severe protein-calorie malnutrition: Secondary | ICD-10-CM | POA: Diagnosis not present

## 2018-01-04 DIAGNOSIS — I5030 Unspecified diastolic (congestive) heart failure: Secondary | ICD-10-CM | POA: Diagnosis not present

## 2018-01-04 DIAGNOSIS — G546 Phantom limb syndrome with pain: Secondary | ICD-10-CM | POA: Diagnosis not present

## 2018-01-04 DIAGNOSIS — E1140 Type 2 diabetes mellitus with diabetic neuropathy, unspecified: Secondary | ICD-10-CM | POA: Diagnosis not present

## 2018-01-07 DIAGNOSIS — Z5111 Encounter for antineoplastic chemotherapy: Secondary | ICD-10-CM | POA: Diagnosis not present

## 2018-01-07 DIAGNOSIS — C8400 Mycosis fungoides, unspecified site: Secondary | ICD-10-CM | POA: Diagnosis not present

## 2018-01-09 ENCOUNTER — Other Ambulatory Visit: Payer: Self-pay | Admitting: Family Medicine

## 2018-01-10 ENCOUNTER — Ambulatory Visit (INDEPENDENT_AMBULATORY_CARE_PROVIDER_SITE_OTHER): Payer: Medicare HMO | Admitting: Orthotics

## 2018-01-10 DIAGNOSIS — Z89432 Acquired absence of left foot: Secondary | ICD-10-CM

## 2018-01-10 DIAGNOSIS — E0849 Diabetes mellitus due to underlying condition with other diabetic neurological complication: Secondary | ICD-10-CM

## 2018-01-10 DIAGNOSIS — I739 Peripheral vascular disease, unspecified: Secondary | ICD-10-CM

## 2018-01-10 DIAGNOSIS — L84 Corns and callosities: Secondary | ICD-10-CM | POA: Diagnosis not present

## 2018-01-10 NOTE — Progress Notes (Signed)

## 2018-01-11 DIAGNOSIS — G546 Phantom limb syndrome with pain: Secondary | ICD-10-CM | POA: Diagnosis not present

## 2018-01-11 DIAGNOSIS — M109 Gout, unspecified: Secondary | ICD-10-CM | POA: Diagnosis not present

## 2018-01-11 DIAGNOSIS — I5030 Unspecified diastolic (congestive) heart failure: Secondary | ICD-10-CM | POA: Diagnosis not present

## 2018-01-11 DIAGNOSIS — C8401 Mycosis fungoides, lymph nodes of head, face, and neck: Secondary | ICD-10-CM | POA: Diagnosis not present

## 2018-01-11 DIAGNOSIS — N400 Enlarged prostate without lower urinary tract symptoms: Secondary | ICD-10-CM | POA: Diagnosis not present

## 2018-01-11 DIAGNOSIS — E1140 Type 2 diabetes mellitus with diabetic neuropathy, unspecified: Secondary | ICD-10-CM | POA: Diagnosis not present

## 2018-01-11 DIAGNOSIS — I2510 Atherosclerotic heart disease of native coronary artery without angina pectoris: Secondary | ICD-10-CM | POA: Diagnosis not present

## 2018-01-11 DIAGNOSIS — R69 Illness, unspecified: Secondary | ICD-10-CM | POA: Diagnosis not present

## 2018-01-11 DIAGNOSIS — I110 Hypertensive heart disease with heart failure: Secondary | ICD-10-CM | POA: Diagnosis not present

## 2018-01-11 DIAGNOSIS — E43 Unspecified severe protein-calorie malnutrition: Secondary | ICD-10-CM | POA: Diagnosis not present

## 2018-01-12 DIAGNOSIS — C8400 Mycosis fungoides, unspecified site: Secondary | ICD-10-CM | POA: Diagnosis not present

## 2018-01-12 DIAGNOSIS — Z713 Dietary counseling and surveillance: Secondary | ICD-10-CM | POA: Diagnosis not present

## 2018-01-12 DIAGNOSIS — R112 Nausea with vomiting, unspecified: Secondary | ICD-10-CM | POA: Diagnosis not present

## 2018-01-12 DIAGNOSIS — R6881 Early satiety: Secondary | ICD-10-CM | POA: Diagnosis not present

## 2018-01-12 DIAGNOSIS — K117 Disturbances of salivary secretion: Secondary | ICD-10-CM | POA: Diagnosis not present

## 2018-01-12 DIAGNOSIS — R634 Abnormal weight loss: Secondary | ICD-10-CM | POA: Diagnosis not present

## 2018-01-12 DIAGNOSIS — C84A0 Cutaneous T-cell lymphoma, unspecified, unspecified site: Secondary | ICD-10-CM | POA: Diagnosis not present

## 2018-01-12 DIAGNOSIS — Z794 Long term (current) use of insulin: Secondary | ICD-10-CM | POA: Diagnosis not present

## 2018-01-12 DIAGNOSIS — Z5111 Encounter for antineoplastic chemotherapy: Secondary | ICD-10-CM | POA: Diagnosis not present

## 2018-01-12 DIAGNOSIS — R432 Parageusia: Secondary | ICD-10-CM | POA: Diagnosis not present

## 2018-01-12 DIAGNOSIS — E119 Type 2 diabetes mellitus without complications: Secondary | ICD-10-CM | POA: Diagnosis not present

## 2018-01-17 ENCOUNTER — Ambulatory Visit (INDEPENDENT_AMBULATORY_CARE_PROVIDER_SITE_OTHER): Payer: Medicare HMO

## 2018-01-17 DIAGNOSIS — I5042 Chronic combined systolic (congestive) and diastolic (congestive) heart failure: Secondary | ICD-10-CM | POA: Diagnosis not present

## 2018-01-17 DIAGNOSIS — Z95810 Presence of automatic (implantable) cardiac defibrillator: Secondary | ICD-10-CM | POA: Diagnosis not present

## 2018-01-18 DIAGNOSIS — I5030 Unspecified diastolic (congestive) heart failure: Secondary | ICD-10-CM | POA: Diagnosis not present

## 2018-01-18 DIAGNOSIS — G546 Phantom limb syndrome with pain: Secondary | ICD-10-CM | POA: Diagnosis not present

## 2018-01-18 DIAGNOSIS — C8401 Mycosis fungoides, lymph nodes of head, face, and neck: Secondary | ICD-10-CM | POA: Diagnosis not present

## 2018-01-18 DIAGNOSIS — N400 Enlarged prostate without lower urinary tract symptoms: Secondary | ICD-10-CM | POA: Diagnosis not present

## 2018-01-18 DIAGNOSIS — E43 Unspecified severe protein-calorie malnutrition: Secondary | ICD-10-CM | POA: Diagnosis not present

## 2018-01-18 DIAGNOSIS — I2510 Atherosclerotic heart disease of native coronary artery without angina pectoris: Secondary | ICD-10-CM | POA: Diagnosis not present

## 2018-01-18 DIAGNOSIS — I110 Hypertensive heart disease with heart failure: Secondary | ICD-10-CM | POA: Diagnosis not present

## 2018-01-18 DIAGNOSIS — M109 Gout, unspecified: Secondary | ICD-10-CM | POA: Diagnosis not present

## 2018-01-18 DIAGNOSIS — R69 Illness, unspecified: Secondary | ICD-10-CM | POA: Diagnosis not present

## 2018-01-18 DIAGNOSIS — E1140 Type 2 diabetes mellitus with diabetic neuropathy, unspecified: Secondary | ICD-10-CM | POA: Diagnosis not present

## 2018-01-18 NOTE — Progress Notes (Signed)
Finally looks like he is going to behave! MCr

## 2018-01-18 NOTE — Progress Notes (Signed)
EPIC Encounter for ICM Monitoring  Patient Name: Eric Price is a 67 y.o. male Date: 01/18/2018 Primary Care Physican: [name], MD Primary Cardiologist: Croitoru Electrophysiologist: Croitoru Dry Weight:    206 lbs office weight       Attempted call to patient and unable to reach.  Left detailed message regarding transmission.  Transmission reviewed.    Thoracic impedance normal.  Prescribed dosage: Furosemide 80 mg take 1 tablet daily  Labs: 11/25/2017 Creatinine 1.10, BUN 23, Potassium 4.8, Sodium 138, EGFR 69-80 11/23/2017 Creatinine 1.19, BUN 29, Potassium 4.3, Sodium 140, EGFR 63-73  Recommendations:    Follow-up plan: ICM clinic phone appointment on 02/17/2018.   Copy of ICM check sent to Dr. Sallyanne Kuster.   3 month ICM trend: 01/17/2018    1 Year ICM trend:       Rosalene Billings, RN 01/18/2018 3:02 PM

## 2018-01-19 DIAGNOSIS — J301 Allergic rhinitis due to pollen: Secondary | ICD-10-CM | POA: Diagnosis not present

## 2018-01-19 DIAGNOSIS — R0982 Postnasal drip: Secondary | ICD-10-CM | POA: Diagnosis not present

## 2018-01-19 DIAGNOSIS — Z5112 Encounter for antineoplastic immunotherapy: Secondary | ICD-10-CM | POA: Diagnosis not present

## 2018-01-19 DIAGNOSIS — C8400 Mycosis fungoides, unspecified site: Secondary | ICD-10-CM | POA: Diagnosis not present

## 2018-01-19 DIAGNOSIS — J302 Other seasonal allergic rhinitis: Secondary | ICD-10-CM | POA: Diagnosis not present

## 2018-01-19 DIAGNOSIS — Z79899 Other long term (current) drug therapy: Secondary | ICD-10-CM | POA: Diagnosis not present

## 2018-01-26 ENCOUNTER — Other Ambulatory Visit: Payer: Self-pay | Admitting: Internal Medicine

## 2018-01-26 ENCOUNTER — Other Ambulatory Visit: Payer: Self-pay

## 2018-01-26 DIAGNOSIS — E119 Type 2 diabetes mellitus without complications: Secondary | ICD-10-CM | POA: Diagnosis not present

## 2018-01-26 DIAGNOSIS — Z794 Long term (current) use of insulin: Secondary | ICD-10-CM | POA: Diagnosis not present

## 2018-01-26 DIAGNOSIS — R110 Nausea: Secondary | ICD-10-CM | POA: Diagnosis not present

## 2018-01-26 DIAGNOSIS — Z713 Dietary counseling and surveillance: Secondary | ICD-10-CM | POA: Diagnosis not present

## 2018-01-26 DIAGNOSIS — Z5112 Encounter for antineoplastic immunotherapy: Secondary | ICD-10-CM | POA: Diagnosis not present

## 2018-01-26 DIAGNOSIS — C8400 Mycosis fungoides, unspecified site: Secondary | ICD-10-CM | POA: Diagnosis not present

## 2018-01-26 DIAGNOSIS — R634 Abnormal weight loss: Secondary | ICD-10-CM | POA: Diagnosis not present

## 2018-01-26 MED ORDER — INSULIN DETEMIR 100 UNIT/ML FLEXPEN: PEN_INJECTOR | SUBCUTANEOUS | 0 refills | Status: DC

## 2018-01-27 DIAGNOSIS — N400 Enlarged prostate without lower urinary tract symptoms: Secondary | ICD-10-CM | POA: Diagnosis not present

## 2018-01-27 DIAGNOSIS — G546 Phantom limb syndrome with pain: Secondary | ICD-10-CM | POA: Diagnosis not present

## 2018-01-27 DIAGNOSIS — I110 Hypertensive heart disease with heart failure: Secondary | ICD-10-CM | POA: Diagnosis not present

## 2018-01-27 DIAGNOSIS — E43 Unspecified severe protein-calorie malnutrition: Secondary | ICD-10-CM | POA: Diagnosis not present

## 2018-01-27 DIAGNOSIS — E1140 Type 2 diabetes mellitus with diabetic neuropathy, unspecified: Secondary | ICD-10-CM | POA: Diagnosis not present

## 2018-01-27 DIAGNOSIS — R69 Illness, unspecified: Secondary | ICD-10-CM | POA: Diagnosis not present

## 2018-01-27 DIAGNOSIS — C8401 Mycosis fungoides, lymph nodes of head, face, and neck: Secondary | ICD-10-CM | POA: Diagnosis not present

## 2018-01-27 DIAGNOSIS — I2510 Atherosclerotic heart disease of native coronary artery without angina pectoris: Secondary | ICD-10-CM | POA: Diagnosis not present

## 2018-01-27 DIAGNOSIS — I5030 Unspecified diastolic (congestive) heart failure: Secondary | ICD-10-CM | POA: Diagnosis not present

## 2018-01-27 DIAGNOSIS — M109 Gout, unspecified: Secondary | ICD-10-CM | POA: Diagnosis not present

## 2018-02-01 DIAGNOSIS — M109 Gout, unspecified: Secondary | ICD-10-CM | POA: Diagnosis not present

## 2018-02-01 DIAGNOSIS — I2510 Atherosclerotic heart disease of native coronary artery without angina pectoris: Secondary | ICD-10-CM | POA: Diagnosis not present

## 2018-02-01 DIAGNOSIS — I5030 Unspecified diastolic (congestive) heart failure: Secondary | ICD-10-CM | POA: Diagnosis not present

## 2018-02-01 DIAGNOSIS — E1140 Type 2 diabetes mellitus with diabetic neuropathy, unspecified: Secondary | ICD-10-CM | POA: Diagnosis not present

## 2018-02-01 DIAGNOSIS — G546 Phantom limb syndrome with pain: Secondary | ICD-10-CM | POA: Diagnosis not present

## 2018-02-01 DIAGNOSIS — N400 Enlarged prostate without lower urinary tract symptoms: Secondary | ICD-10-CM | POA: Diagnosis not present

## 2018-02-01 DIAGNOSIS — E43 Unspecified severe protein-calorie malnutrition: Secondary | ICD-10-CM | POA: Diagnosis not present

## 2018-02-01 DIAGNOSIS — C8401 Mycosis fungoides, lymph nodes of head, face, and neck: Secondary | ICD-10-CM | POA: Diagnosis not present

## 2018-02-01 DIAGNOSIS — I110 Hypertensive heart disease with heart failure: Secondary | ICD-10-CM | POA: Diagnosis not present

## 2018-02-01 DIAGNOSIS — R69 Illness, unspecified: Secondary | ICD-10-CM | POA: Diagnosis not present

## 2018-02-02 ENCOUNTER — Ambulatory Visit: Payer: Medicare HMO | Admitting: Cardiovascular Disease

## 2018-02-02 DIAGNOSIS — K219 Gastro-esophageal reflux disease without esophagitis: Secondary | ICD-10-CM | POA: Diagnosis not present

## 2018-02-02 DIAGNOSIS — C8400 Mycosis fungoides, unspecified site: Secondary | ICD-10-CM | POA: Diagnosis not present

## 2018-02-02 DIAGNOSIS — E1140 Type 2 diabetes mellitus with diabetic neuropathy, unspecified: Secondary | ICD-10-CM | POA: Diagnosis not present

## 2018-02-02 DIAGNOSIS — J029 Acute pharyngitis, unspecified: Secondary | ICD-10-CM | POA: Diagnosis not present

## 2018-02-02 DIAGNOSIS — Z79899 Other long term (current) drug therapy: Secondary | ICD-10-CM | POA: Diagnosis not present

## 2018-02-02 DIAGNOSIS — Z5111 Encounter for antineoplastic chemotherapy: Secondary | ICD-10-CM | POA: Diagnosis not present

## 2018-02-02 DIAGNOSIS — Z794 Long term (current) use of insulin: Secondary | ICD-10-CM | POA: Diagnosis not present

## 2018-02-08 DIAGNOSIS — R69 Illness, unspecified: Secondary | ICD-10-CM | POA: Diagnosis not present

## 2018-02-08 DIAGNOSIS — I5030 Unspecified diastolic (congestive) heart failure: Secondary | ICD-10-CM | POA: Diagnosis not present

## 2018-02-08 DIAGNOSIS — E43 Unspecified severe protein-calorie malnutrition: Secondary | ICD-10-CM | POA: Diagnosis not present

## 2018-02-08 DIAGNOSIS — E1140 Type 2 diabetes mellitus with diabetic neuropathy, unspecified: Secondary | ICD-10-CM | POA: Diagnosis not present

## 2018-02-08 DIAGNOSIS — C8401 Mycosis fungoides, lymph nodes of head, face, and neck: Secondary | ICD-10-CM | POA: Diagnosis not present

## 2018-02-08 DIAGNOSIS — I110 Hypertensive heart disease with heart failure: Secondary | ICD-10-CM | POA: Diagnosis not present

## 2018-02-08 DIAGNOSIS — M109 Gout, unspecified: Secondary | ICD-10-CM | POA: Diagnosis not present

## 2018-02-08 DIAGNOSIS — I2510 Atherosclerotic heart disease of native coronary artery without angina pectoris: Secondary | ICD-10-CM | POA: Diagnosis not present

## 2018-02-08 DIAGNOSIS — N400 Enlarged prostate without lower urinary tract symptoms: Secondary | ICD-10-CM | POA: Diagnosis not present

## 2018-02-08 DIAGNOSIS — G546 Phantom limb syndrome with pain: Secondary | ICD-10-CM | POA: Diagnosis not present

## 2018-02-15 ENCOUNTER — Ambulatory Visit: Payer: Medicare HMO | Admitting: Cardiovascular Disease

## 2018-02-15 DIAGNOSIS — C8409 Mycosis fungoides, extranodal and solid organ sites: Secondary | ICD-10-CM | POA: Diagnosis not present

## 2018-02-15 DIAGNOSIS — C8400 Mycosis fungoides, unspecified site: Secondary | ICD-10-CM | POA: Diagnosis not present

## 2018-02-16 DIAGNOSIS — R432 Parageusia: Secondary | ICD-10-CM | POA: Diagnosis not present

## 2018-02-16 DIAGNOSIS — Z5112 Encounter for antineoplastic immunotherapy: Secondary | ICD-10-CM | POA: Diagnosis not present

## 2018-02-16 DIAGNOSIS — R634 Abnormal weight loss: Secondary | ICD-10-CM | POA: Diagnosis not present

## 2018-02-16 DIAGNOSIS — R6881 Early satiety: Secondary | ICD-10-CM | POA: Diagnosis not present

## 2018-02-16 DIAGNOSIS — C8400 Mycosis fungoides, unspecified site: Secondary | ICD-10-CM | POA: Diagnosis not present

## 2018-02-16 DIAGNOSIS — M109 Gout, unspecified: Secondary | ICD-10-CM | POA: Diagnosis not present

## 2018-02-16 DIAGNOSIS — E119 Type 2 diabetes mellitus without complications: Secondary | ICD-10-CM | POA: Diagnosis not present

## 2018-02-16 DIAGNOSIS — R112 Nausea with vomiting, unspecified: Secondary | ICD-10-CM | POA: Diagnosis not present

## 2018-02-16 DIAGNOSIS — K117 Disturbances of salivary secretion: Secondary | ICD-10-CM | POA: Diagnosis not present

## 2018-02-16 DIAGNOSIS — Z713 Dietary counseling and surveillance: Secondary | ICD-10-CM | POA: Diagnosis not present

## 2018-02-17 ENCOUNTER — Ambulatory Visit (INDEPENDENT_AMBULATORY_CARE_PROVIDER_SITE_OTHER): Payer: Medicare HMO | Admitting: *Deleted

## 2018-02-17 ENCOUNTER — Telehealth: Payer: Self-pay | Admitting: Cardiology

## 2018-02-17 DIAGNOSIS — Z95810 Presence of automatic (implantable) cardiac defibrillator: Secondary | ICD-10-CM

## 2018-02-17 DIAGNOSIS — I5042 Chronic combined systolic (congestive) and diastolic (congestive) heart failure: Secondary | ICD-10-CM

## 2018-02-17 NOTE — Progress Notes (Signed)
EPIC Encounter for ICM Monitoring  Patient Name: Eric Price is a 67 y.o. male Date: 02/17/2018 Primary Care Physican: [name], MD Primary Cardiologist:Berry Electrophysiologist:Croitoru Dry Weight:206lbs office weight       Attempted call to patient and unable to reach.  Left detailed message, per DPR, regarding transmission.  Transmission reviewed.    Thoracic impedance normal but was abnormal suggesting fluid accumulation for about 2 weeks.  Prescribed dosage: Furosemide80 mg take 1 tablet daily  Labs: 11/25/2017 Creatinine 1.10, BUN 23, Potassium 4.8, Sodium 138, EGFR 69-80 11/23/2017 Creatinine 1.19, BUN 29, Potassium 4.3, Sodium 140, EGFR 63-73  Recommendations: Left voice mail with ICM number and encouraged to call if experiencing any fluid symptoms.  Follow-up plan: ICM clinic phone appointment on 03/21/2018.   Copy of ICM check sent to Dr. Sallyanne Kuster.   3 month ICM trend: 02/17/2018    1 Year ICM trend:       Rosalene Billings, RN 02/17/2018 3:26 PM

## 2018-02-17 NOTE — Progress Notes (Signed)
Remote ICD transmission.   

## 2018-02-17 NOTE — Progress Notes (Signed)
Thank you MCr 

## 2018-02-17 NOTE — Telephone Encounter (Signed)
Spoke with pt and reminded pt of remote transmission that is due today. Pt verbalized understanding.   

## 2018-02-18 ENCOUNTER — Encounter: Payer: Self-pay | Admitting: Cardiology

## 2018-02-24 DIAGNOSIS — R69 Illness, unspecified: Secondary | ICD-10-CM | POA: Diagnosis not present

## 2018-02-24 DIAGNOSIS — I5030 Unspecified diastolic (congestive) heart failure: Secondary | ICD-10-CM | POA: Diagnosis not present

## 2018-02-24 DIAGNOSIS — M109 Gout, unspecified: Secondary | ICD-10-CM | POA: Diagnosis not present

## 2018-02-24 DIAGNOSIS — C8401 Mycosis fungoides, lymph nodes of head, face, and neck: Secondary | ICD-10-CM | POA: Diagnosis not present

## 2018-02-24 DIAGNOSIS — N400 Enlarged prostate without lower urinary tract symptoms: Secondary | ICD-10-CM | POA: Diagnosis not present

## 2018-02-24 DIAGNOSIS — E1140 Type 2 diabetes mellitus with diabetic neuropathy, unspecified: Secondary | ICD-10-CM | POA: Diagnosis not present

## 2018-02-24 DIAGNOSIS — I110 Hypertensive heart disease with heart failure: Secondary | ICD-10-CM | POA: Diagnosis not present

## 2018-02-24 DIAGNOSIS — E43 Unspecified severe protein-calorie malnutrition: Secondary | ICD-10-CM | POA: Diagnosis not present

## 2018-02-24 DIAGNOSIS — G546 Phantom limb syndrome with pain: Secondary | ICD-10-CM | POA: Diagnosis not present

## 2018-02-24 DIAGNOSIS — I2510 Atherosclerotic heart disease of native coronary artery without angina pectoris: Secondary | ICD-10-CM | POA: Diagnosis not present

## 2018-02-25 ENCOUNTER — Other Ambulatory Visit: Payer: Self-pay | Admitting: Family Medicine

## 2018-03-02 DIAGNOSIS — L299 Pruritus, unspecified: Secondary | ICD-10-CM | POA: Diagnosis not present

## 2018-03-02 DIAGNOSIS — Z5112 Encounter for antineoplastic immunotherapy: Secondary | ICD-10-CM | POA: Diagnosis not present

## 2018-03-02 DIAGNOSIS — G629 Polyneuropathy, unspecified: Secondary | ICD-10-CM | POA: Diagnosis not present

## 2018-03-02 DIAGNOSIS — M549 Dorsalgia, unspecified: Secondary | ICD-10-CM | POA: Diagnosis not present

## 2018-03-02 DIAGNOSIS — Z923 Personal history of irradiation: Secondary | ICD-10-CM | POA: Diagnosis not present

## 2018-03-02 DIAGNOSIS — C8400 Mycosis fungoides, unspecified site: Secondary | ICD-10-CM | POA: Diagnosis not present

## 2018-03-04 ENCOUNTER — Encounter: Payer: Self-pay | Admitting: Cardiovascular Disease

## 2018-03-04 ENCOUNTER — Ambulatory Visit: Payer: Medicare HMO | Admitting: Cardiovascular Disease

## 2018-03-04 VITALS — BP 124/78 | HR 106 | Ht 69.0 in | Wt 197.8 lb

## 2018-03-04 DIAGNOSIS — Z95810 Presence of automatic (implantable) cardiac defibrillator: Secondary | ICD-10-CM | POA: Diagnosis not present

## 2018-03-04 DIAGNOSIS — I428 Other cardiomyopathies: Secondary | ICD-10-CM

## 2018-03-04 DIAGNOSIS — Z89512 Acquired absence of left leg below knee: Secondary | ICD-10-CM

## 2018-03-04 DIAGNOSIS — I10 Essential (primary) hypertension: Secondary | ICD-10-CM | POA: Diagnosis not present

## 2018-03-04 DIAGNOSIS — E782 Mixed hyperlipidemia: Secondary | ICD-10-CM | POA: Diagnosis not present

## 2018-03-04 NOTE — Assessment & Plan Note (Signed)
She has noncritical CAD by cath 2016 with 75% distal RCA stenosis.  Did recently have a Myoview stress test that was low risk.

## 2018-03-04 NOTE — Patient Instructions (Signed)
Medication Instructions: Your physician recommends that you continue on your current medications as directed. Please refer to the Current Medication list given to you today.   Follow-Up: We request that you follow-up in: 6 months with Luke Kilroy, PA and in 12 months with Dr Berry  You will receive a reminder letter in the mail two months in advance. If you don't receive a letter, please call our office to schedule the follow-up appointment.  If you need a refill on your cardiac medications before your next appointment, please call your pharmacy.  

## 2018-03-04 NOTE — Assessment & Plan Note (Signed)
History of hyperlipidemia on statin therapy. We will recheck a lipid and liver profile 

## 2018-03-04 NOTE — Assessment & Plan Note (Signed)
History of left BKA wearing a prosthesis.

## 2018-03-04 NOTE — Assessment & Plan Note (Signed)
History of ICD implantation by Dr. Sallyanne Kuster for primary prevention in 2016 which he follows.
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
## 2018-03-04 NOTE — Assessment & Plan Note (Signed)
History of essential hypertension with blood pressure measured today at 124/78...  He is on carvedilol and lisinopril.  Continue current meds at current dosing

## 2018-03-04 NOTE — Assessment & Plan Note (Signed)
History of nonischemic cardia myopathy which has improved by recent echo at [facility], now in the 55% range, previously in the 30 to 35% range.

## 2018-03-05 DIAGNOSIS — R6510 Systemic inflammatory response syndrome (SIRS) of non-infectious origin without acute organ dysfunction: Secondary | ICD-10-CM

## 2018-03-05 HISTORY — DX: Systemic inflammatory response syndrome (sirs) of non-infectious origin without acute organ dysfunction: R65.10

## 2018-03-07 ENCOUNTER — Inpatient Hospital Stay (HOSPITAL_COMMUNITY)
Admission: EM | Admit: 2018-03-07 | Discharge: 2018-03-14 | DRG: 314 | Disposition: A | Discharge status: Home / Self Care | Payer: Medicare HMO | Attending: Internal Medicine | Admitting: Internal Medicine

## 2018-03-07 DIAGNOSIS — R410 Disorientation, unspecified: Secondary | ICD-10-CM

## 2018-03-07 DIAGNOSIS — E1142 Type 2 diabetes mellitus with diabetic polyneuropathy: Secondary | ICD-10-CM | POA: Diagnosis not present

## 2018-03-07 DIAGNOSIS — I428 Other cardiomyopathies: Secondary | ICD-10-CM

## 2018-03-07 DIAGNOSIS — G546 Phantom limb syndrome with pain: Secondary | ICD-10-CM | POA: Diagnosis present

## 2018-03-07 DIAGNOSIS — I10 Essential (primary) hypertension: Secondary | ICD-10-CM | POA: Diagnosis not present

## 2018-03-07 DIAGNOSIS — R531 Weakness: Secondary | ICD-10-CM | POA: Diagnosis not present

## 2018-03-07 DIAGNOSIS — T80218A Other infection due to central venous catheter, initial encounter: Secondary | ICD-10-CM | POA: Diagnosis not present

## 2018-03-07 DIAGNOSIS — I5042 Chronic combined systolic (congestive) and diastolic (congestive) heart failure: Secondary | ICD-10-CM | POA: Diagnosis present

## 2018-03-07 DIAGNOSIS — Z923 Personal history of irradiation: Secondary | ICD-10-CM

## 2018-03-07 DIAGNOSIS — Z8249 Family history of ischemic heart disease and other diseases of the circulatory system: Secondary | ICD-10-CM

## 2018-03-07 DIAGNOSIS — I2510 Atherosclerotic heart disease of native coronary artery without angina pectoris: Secondary | ICD-10-CM | POA: Diagnosis present

## 2018-03-07 DIAGNOSIS — Z452 Encounter for adjustment and management of vascular access device: Secondary | ICD-10-CM | POA: Diagnosis not present

## 2018-03-07 DIAGNOSIS — C8400 Mycosis fungoides, unspecified site: Secondary | ICD-10-CM | POA: Diagnosis present

## 2018-03-07 DIAGNOSIS — E861 Hypovolemia: Secondary | ICD-10-CM | POA: Diagnosis present

## 2018-03-07 DIAGNOSIS — Z7901 Long term (current) use of anticoagulants: Secondary | ICD-10-CM

## 2018-03-07 DIAGNOSIS — I110 Hypertensive heart disease with heart failure: Secondary | ICD-10-CM | POA: Diagnosis present

## 2018-03-07 DIAGNOSIS — N183 Chronic kidney disease, stage 3 unspecified: Secondary | ICD-10-CM

## 2018-03-07 DIAGNOSIS — N179 Acute kidney failure, unspecified: Secondary | ICD-10-CM | POA: Diagnosis present

## 2018-03-07 DIAGNOSIS — N289 Disorder of kidney and ureter, unspecified: Secondary | ICD-10-CM | POA: Diagnosis not present

## 2018-03-07 DIAGNOSIS — R42 Dizziness and giddiness: Secondary | ICD-10-CM | POA: Diagnosis not present

## 2018-03-07 DIAGNOSIS — B952 Enterococcus as the cause of diseases classified elsewhere: Secondary | ICD-10-CM | POA: Diagnosis not present

## 2018-03-07 DIAGNOSIS — R509 Fever, unspecified: Secondary | ICD-10-CM | POA: Diagnosis present

## 2018-03-07 DIAGNOSIS — G9340 Encephalopathy, unspecified: Secondary | ICD-10-CM | POA: Diagnosis present

## 2018-03-07 DIAGNOSIS — Z89512 Acquired absence of left leg below knee: Secondary | ICD-10-CM

## 2018-03-07 DIAGNOSIS — R6510 Systemic inflammatory response syndrome (SIRS) of non-infectious origin without acute organ dysfunction: Secondary | ICD-10-CM | POA: Diagnosis present

## 2018-03-07 DIAGNOSIS — G9341 Metabolic encephalopathy: Secondary | ICD-10-CM | POA: Diagnosis present

## 2018-03-07 DIAGNOSIS — Z833 Family history of diabetes mellitus: Secondary | ICD-10-CM

## 2018-03-07 DIAGNOSIS — Z794 Long term (current) use of insulin: Secondary | ICD-10-CM

## 2018-03-07 DIAGNOSIS — Z7984 Long term (current) use of oral hypoglycemic drugs: Secondary | ICD-10-CM

## 2018-03-07 DIAGNOSIS — R000 Tachycardia, unspecified: Secondary | ICD-10-CM | POA: Diagnosis present

## 2018-03-07 DIAGNOSIS — R7881 Bacteremia: Secondary | ICD-10-CM | POA: Diagnosis not present

## 2018-03-07 DIAGNOSIS — Z9221 Personal history of antineoplastic chemotherapy: Secondary | ICD-10-CM

## 2018-03-07 DIAGNOSIS — E871 Hypo-osmolality and hyponatremia: Secondary | ICD-10-CM | POA: Diagnosis present

## 2018-03-07 DIAGNOSIS — E1165 Type 2 diabetes mellitus with hyperglycemia: Secondary | ICD-10-CM | POA: Diagnosis not present

## 2018-03-07 DIAGNOSIS — R7989 Other specified abnormal findings of blood chemistry: Secondary | ICD-10-CM

## 2018-03-07 DIAGNOSIS — E785 Hyperlipidemia, unspecified: Secondary | ICD-10-CM | POA: Diagnosis present

## 2018-03-07 DIAGNOSIS — Z95810 Presence of automatic (implantable) cardiac defibrillator: Secondary | ICD-10-CM

## 2018-03-07 DIAGNOSIS — R402441 Other coma, without documented Glasgow coma scale score, or with partial score reported, in the field [EMT or ambulance]: Secondary | ICD-10-CM | POA: Diagnosis not present

## 2018-03-07 DIAGNOSIS — R0902 Hypoxemia: Secondary | ICD-10-CM | POA: Diagnosis not present

## 2018-03-07 DIAGNOSIS — Z885 Allergy status to narcotic agent status: Secondary | ICD-10-CM

## 2018-03-07 DIAGNOSIS — I429 Cardiomyopathy, unspecified: Secondary | ICD-10-CM | POA: Diagnosis present

## 2018-03-07 DIAGNOSIS — R404 Transient alteration of awareness: Secondary | ICD-10-CM | POA: Diagnosis not present

## 2018-03-07 DIAGNOSIS — Z79899 Other long term (current) drug therapy: Secondary | ICD-10-CM

## 2018-03-07 DIAGNOSIS — E1151 Type 2 diabetes mellitus with diabetic peripheral angiopathy without gangrene: Secondary | ICD-10-CM | POA: Diagnosis present

## 2018-03-07 DIAGNOSIS — Z79891 Long term (current) use of opiate analgesic: Secondary | ICD-10-CM

## 2018-03-07 HISTORY — DX: Disorder of kidney and ureter, unspecified: N28.9

## 2018-03-07 HISTORY — DX: Systemic inflammatory response syndrome (sirs) of non-infectious origin without acute organ dysfunction: R65.10

## 2018-03-08 ENCOUNTER — Emergency Department (HOSPITAL_COMMUNITY): Payer: Medicare HMO

## 2018-03-08 ENCOUNTER — Other Ambulatory Visit: Payer: Self-pay

## 2018-03-08 ENCOUNTER — Inpatient Hospital Stay (HOSPITAL_COMMUNITY): Payer: Medicare HMO

## 2018-03-08 ENCOUNTER — Encounter (HOSPITAL_COMMUNITY): Payer: Self-pay | Admitting: Family Medicine

## 2018-03-08 DIAGNOSIS — Z955 Presence of coronary angioplasty implant and graft: Secondary | ICD-10-CM

## 2018-03-08 DIAGNOSIS — R410 Disorientation, unspecified: Secondary | ICD-10-CM | POA: Diagnosis not present

## 2018-03-08 DIAGNOSIS — R000 Tachycardia, unspecified: Secondary | ICD-10-CM | POA: Diagnosis present

## 2018-03-08 DIAGNOSIS — E1165 Type 2 diabetes mellitus with hyperglycemia: Secondary | ICD-10-CM | POA: Diagnosis not present

## 2018-03-08 DIAGNOSIS — R6510 Systemic inflammatory response syndrome (SIRS) of non-infectious origin without acute organ dysfunction: Secondary | ICD-10-CM | POA: Diagnosis not present

## 2018-03-08 DIAGNOSIS — I340 Nonrheumatic mitral (valve) insufficiency: Secondary | ICD-10-CM | POA: Diagnosis not present

## 2018-03-08 DIAGNOSIS — G9341 Metabolic encephalopathy: Secondary | ICD-10-CM | POA: Diagnosis not present

## 2018-03-08 DIAGNOSIS — E871 Hypo-osmolality and hyponatremia: Secondary | ICD-10-CM | POA: Diagnosis not present

## 2018-03-08 DIAGNOSIS — Z888 Allergy status to other drugs, medicaments and biological substances status: Secondary | ICD-10-CM

## 2018-03-08 DIAGNOSIS — I2510 Atherosclerotic heart disease of native coronary artery without angina pectoris: Secondary | ICD-10-CM | POA: Diagnosis not present

## 2018-03-08 DIAGNOSIS — R7881 Bacteremia: Secondary | ICD-10-CM | POA: Diagnosis not present

## 2018-03-08 DIAGNOSIS — G9340 Encephalopathy, unspecified: Secondary | ICD-10-CM | POA: Diagnosis present

## 2018-03-08 DIAGNOSIS — Z8249 Family history of ischemic heart disease and other diseases of the circulatory system: Secondary | ICD-10-CM | POA: Diagnosis not present

## 2018-03-08 DIAGNOSIS — E785 Hyperlipidemia, unspecified: Secondary | ICD-10-CM | POA: Diagnosis present

## 2018-03-08 DIAGNOSIS — Z89512 Acquired absence of left leg below knee: Secondary | ICD-10-CM | POA: Diagnosis not present

## 2018-03-08 DIAGNOSIS — B952 Enterococcus as the cause of diseases classified elsewhere: Secondary | ICD-10-CM | POA: Diagnosis not present

## 2018-03-08 DIAGNOSIS — E1151 Type 2 diabetes mellitus with diabetic peripheral angiopathy without gangrene: Secondary | ICD-10-CM | POA: Diagnosis present

## 2018-03-08 DIAGNOSIS — N183 Chronic kidney disease, stage 3 unspecified: Secondary | ICD-10-CM

## 2018-03-08 DIAGNOSIS — R42 Dizziness and giddiness: Secondary | ICD-10-CM | POA: Diagnosis not present

## 2018-03-08 DIAGNOSIS — D72819 Decreased white blood cell count, unspecified: Secondary | ICD-10-CM | POA: Diagnosis not present

## 2018-03-08 DIAGNOSIS — Z885 Allergy status to narcotic agent status: Secondary | ICD-10-CM | POA: Diagnosis not present

## 2018-03-08 DIAGNOSIS — E1142 Type 2 diabetes mellitus with diabetic polyneuropathy: Secondary | ICD-10-CM | POA: Diagnosis not present

## 2018-03-08 DIAGNOSIS — G546 Phantom limb syndrome with pain: Secondary | ICD-10-CM | POA: Diagnosis present

## 2018-03-08 DIAGNOSIS — C8400 Mycosis fungoides, unspecified site: Secondary | ICD-10-CM | POA: Diagnosis not present

## 2018-03-08 DIAGNOSIS — I5042 Chronic combined systolic (congestive) and diastolic (congestive) heart failure: Secondary | ICD-10-CM | POA: Diagnosis not present

## 2018-03-08 DIAGNOSIS — Z833 Family history of diabetes mellitus: Secondary | ICD-10-CM | POA: Diagnosis not present

## 2018-03-08 DIAGNOSIS — I429 Cardiomyopathy, unspecified: Secondary | ICD-10-CM | POA: Diagnosis not present

## 2018-03-08 DIAGNOSIS — N179 Acute kidney failure, unspecified: Secondary | ICD-10-CM | POA: Diagnosis not present

## 2018-03-08 DIAGNOSIS — Z9221 Personal history of antineoplastic chemotherapy: Secondary | ICD-10-CM | POA: Diagnosis not present

## 2018-03-08 DIAGNOSIS — N289 Disorder of kidney and ureter, unspecified: Secondary | ICD-10-CM

## 2018-03-08 DIAGNOSIS — R0602 Shortness of breath: Secondary | ICD-10-CM | POA: Diagnosis not present

## 2018-03-08 DIAGNOSIS — Z79899 Other long term (current) drug therapy: Secondary | ICD-10-CM

## 2018-03-08 DIAGNOSIS — Z7901 Long term (current) use of anticoagulants: Secondary | ICD-10-CM | POA: Diagnosis not present

## 2018-03-08 DIAGNOSIS — Z452 Encounter for adjustment and management of vascular access device: Secondary | ICD-10-CM | POA: Diagnosis not present

## 2018-03-08 DIAGNOSIS — I10 Essential (primary) hypertension: Secondary | ICD-10-CM | POA: Diagnosis not present

## 2018-03-08 DIAGNOSIS — I110 Hypertensive heart disease with heart failure: Secondary | ICD-10-CM | POA: Diagnosis not present

## 2018-03-08 DIAGNOSIS — R7989 Other specified abnormal findings of blood chemistry: Secondary | ICD-10-CM | POA: Diagnosis not present

## 2018-03-08 DIAGNOSIS — Z794 Long term (current) use of insulin: Secondary | ICD-10-CM | POA: Diagnosis not present

## 2018-03-08 DIAGNOSIS — T80218A Other infection due to central venous catheter, initial encounter: Secondary | ICD-10-CM | POA: Diagnosis not present

## 2018-03-08 DIAGNOSIS — R509 Fever, unspecified: Secondary | ICD-10-CM | POA: Diagnosis present

## 2018-03-08 HISTORY — DX: Disorder of kidney and ureter, unspecified: N28.9

## 2018-03-08 LAB — BLOOD CULTURE ID PANEL (REFLEXED)
Acinetobacter baumannii: NOT DETECTED
CANDIDA GLABRATA: NOT DETECTED
CANDIDA TROPICALIS: NOT DETECTED
Candida albicans: NOT DETECTED
Candida krusei: NOT DETECTED
Candida parapsilosis: NOT DETECTED
ENTEROCOCCUS SPECIES: DETECTED — AB
Enterobacter cloacae complex: NOT DETECTED
Enterobacteriaceae species: NOT DETECTED
Escherichia coli: NOT DETECTED
Haemophilus influenzae: NOT DETECTED
KLEBSIELLA PNEUMONIAE: NOT DETECTED
Klebsiella oxytoca: NOT DETECTED
LISTERIA MONOCYTOGENES: NOT DETECTED
NEISSERIA MENINGITIDIS: NOT DETECTED
PROTEUS SPECIES: NOT DETECTED
Pseudomonas aeruginosa: NOT DETECTED
SERRATIA MARCESCENS: NOT DETECTED
STAPHYLOCOCCUS SPECIES: NOT DETECTED
STREPTOCOCCUS SPECIES: NOT DETECTED
Staphylococcus aureus (BCID): NOT DETECTED
Streptococcus agalactiae: NOT DETECTED
Streptococcus pneumoniae: NOT DETECTED
Streptococcus pyogenes: NOT DETECTED
VANCOMYCIN RESISTANCE: NOT DETECTED

## 2018-03-08 LAB — INFLUENZA PANEL BY PCR (TYPE A & B)
INFLAPCR: NEGATIVE
INFLBPCR: NEGATIVE

## 2018-03-08 LAB — RAPID URINE DRUG SCREEN, HOSP PERFORMED
AMPHETAMINES: NOT DETECTED
BENZODIAZEPINES: NOT DETECTED
Barbiturates: NOT DETECTED
COCAINE: NOT DETECTED
Opiates: NOT DETECTED
Tetrahydrocannabinol: NOT DETECTED

## 2018-03-08 LAB — COMPREHENSIVE METABOLIC PANEL
ALBUMIN: 4.2 g/dL (ref 3.5–5.0)
ALK PHOS: 144 U/L — AB (ref 38–126)
ALT: 15 U/L — ABNORMAL LOW (ref 17–63)
ANION GAP: 12 (ref 5–15)
AST: 14 U/L — AB (ref 15–41)
BILIRUBIN TOTAL: 0.6 mg/dL (ref 0.3–1.2)
BUN: 27 mg/dL — AB (ref 6–20)
CALCIUM: 9.8 mg/dL (ref 8.9–10.3)
CO2: 28 mmol/L (ref 22–32)
Chloride: 94 mmol/L — ABNORMAL LOW (ref 101–111)
Creatinine, Ser: 1.96 mg/dL — ABNORMAL HIGH (ref 0.61–1.24)
GFR calc Af Amer: 39 mL/min — ABNORMAL LOW (ref >60)
GFR calc non Af Amer: 34 mL/min — ABNORMAL LOW (ref >60)
GLUCOSE: 83 mg/dL (ref 65–99)
Potassium: 4 mmol/L (ref 3.5–5.1)
SODIUM: 134 mmol/L — AB (ref 135–145)
TOTAL PROTEIN: 7.5 g/dL (ref 6.5–8.1)

## 2018-03-08 LAB — URINALYSIS, ROUTINE W REFLEX MICROSCOPIC
BILIRUBIN URINE: NEGATIVE
GLUCOSE, UA: NEGATIVE mg/dL
HGB URINE DIPSTICK: NEGATIVE
KETONES UR: NEGATIVE mg/dL
Leukocytes, UA: NEGATIVE
Nitrite: NEGATIVE
PH: 5 (ref 5.0–8.0)
Protein, ur: NEGATIVE mg/dL
SPECIFIC GRAVITY, URINE: 1.011 (ref 1.005–1.030)

## 2018-03-08 LAB — CBG MONITORING, ED
GLUCOSE-CAPILLARY: 118 mg/dL — AB (ref 65–99)
Glucose-Capillary: 109 mg/dL — ABNORMAL HIGH (ref 65–99)
Glucose-Capillary: 92 mg/dL (ref 65–99)

## 2018-03-08 LAB — HIV ANTIBODY (ROUTINE TESTING W REFLEX): HIV SCREEN 4TH GENERATION: NONREACTIVE

## 2018-03-08 LAB — CBC WITH DIFFERENTIAL/PLATELET
ABS IMMATURE GRANULOCYTES: 0 10*3/uL (ref 0.0–0.1)
BASOS ABS: 0 10*3/uL (ref 0.0–0.1)
Basophils Relative: 0 %
EOS PCT: 0 %
Eosinophils Absolute: 0 10*3/uL (ref 0.0–0.7)
HCT: 38.4 % — ABNORMAL LOW (ref 39.0–52.0)
HEMOGLOBIN: 12.4 g/dL — AB (ref 13.0–17.0)
Immature Granulocytes: 0 %
LYMPHS PCT: 6 %
Lymphs Abs: 0.6 10*3/uL — ABNORMAL LOW (ref 0.7–4.0)
MCH: 26.3 pg (ref 26.0–34.0)
MCHC: 32.3 g/dL (ref 30.0–36.0)
MCV: 81.5 fL (ref 78.0–100.0)
MONO ABS: 0.5 10*3/uL (ref 0.1–1.0)
MONOS PCT: 6 %
NEUTROS ABS: 8 10*3/uL — AB (ref 1.7–7.7)
Neutrophils Relative %: 88 %
Platelets: 249 10*3/uL (ref 150–400)
RBC: 4.71 MIL/uL (ref 4.22–5.81)
RDW: 13.8 % (ref 11.5–15.5)
WBC: 9.1 10*3/uL (ref 4.0–10.5)

## 2018-03-08 LAB — BASIC METABOLIC PANEL
ANION GAP: 13 (ref 5–15)
BUN: 27 mg/dL — AB (ref 6–20)
CHLORIDE: 95 mmol/L — AB (ref 101–111)
CO2: 26 mmol/L (ref 22–32)
Calcium: 9.2 mg/dL (ref 8.9–10.3)
Creatinine, Ser: 1.96 mg/dL — ABNORMAL HIGH (ref 0.61–1.24)
GFR, EST AFRICAN AMERICAN: 39 mL/min — AB (ref >60)
GFR, EST NON AFRICAN AMERICAN: 34 mL/min — AB (ref >60)
Glucose, Bld: 104 mg/dL — ABNORMAL HIGH (ref 65–99)
POTASSIUM: 4 mmol/L (ref 3.5–5.1)
SODIUM: 134 mmol/L — AB (ref 135–145)

## 2018-03-08 LAB — BLOOD GAS, ARTERIAL
Acid-Base Excess: 0.9 mmol/L (ref 0.0–2.0)
Bicarbonate: 25.3 mmol/L (ref 20.0–28.0)
Drawn by: 257081
FIO2: 21
O2 Saturation: 94.5 %
PATIENT TEMPERATURE: 98.6
PH ART: 7.39 (ref 7.350–7.450)
pCO2 arterial: 42.8 mmHg (ref 32.0–48.0)
pO2, Arterial: 75.1 mmHg — ABNORMAL LOW (ref 83.0–108.0)

## 2018-03-08 LAB — CBC
HEMATOCRIT: 33.5 % — AB (ref 39.0–52.0)
Hemoglobin: 10.9 g/dL — ABNORMAL LOW (ref 13.0–17.0)
MCH: 26.8 pg (ref 26.0–34.0)
MCHC: 32.5 g/dL (ref 30.0–36.0)
MCV: 82.5 fL (ref 78.0–100.0)
Platelets: 214 10*3/uL (ref 150–400)
RBC: 4.06 MIL/uL — AB (ref 4.22–5.81)
RDW: 14.1 % (ref 11.5–15.5)
WBC: 9 10*3/uL (ref 4.0–10.5)

## 2018-03-08 LAB — LACTIC ACID, PLASMA
Lactic Acid, Venous: 1.4 mmol/L (ref 0.5–1.9)
Lactic Acid, Venous: 1.1 mmol/L (ref 0.5–1.9)

## 2018-03-08 LAB — GLUCOSE, CAPILLARY
GLUCOSE-CAPILLARY: 117 mg/dL — AB (ref 65–99)
Glucose-Capillary: 103 mg/dL — ABNORMAL HIGH (ref 65–99)

## 2018-03-08 LAB — MRSA PCR SCREENING: MRSA BY PCR: NEGATIVE

## 2018-03-08 LAB — I-STAT CG4 LACTIC ACID, ED: Lactic Acid, Venous: 1.78 mmol/L (ref 0.5–1.9)

## 2018-03-08 MED ORDER — VANCOMYCIN HCL 10 G IV SOLR: 1250.0000 mg | INTRAVENOUS | Status: DC

## 2018-03-08 MED ORDER — PIPERACILLIN-TAZOBACTAM 3.375 G IVPB 30 MIN
3.3750 g | Freq: Once | INTRAVENOUS | Status: AC
  Administered 2018-03-08: 3.375 g via INTRAVENOUS
  Filled 2018-03-08: qty 50

## 2018-03-08 MED ORDER — INSULIN GLARGINE 100 UNIT/ML ~~LOC~~ SOLN
30.0000 [IU] | Freq: Two times a day (BID) | SUBCUTANEOUS | Status: DC
  Administered 2018-03-08: 30 [IU] via SUBCUTANEOUS
  Filled 2018-03-08 (×2): qty 0.3

## 2018-03-08 MED ORDER — TIZANIDINE HCL 2 MG PO TABS
2.0000 mg | ORAL_TABLET | Freq: Every evening | ORAL | Status: DC | PRN
  Filled 2018-03-08: qty 1

## 2018-03-08 MED ORDER — SODIUM CHLORIDE 0.9% FLUSH: 10.0000 mL | INTRAVENOUS | Status: DC | PRN

## 2018-03-08 MED ORDER — INSULIN GLARGINE 100 UNIT/ML ~~LOC~~ SOLN
15.0000 [IU] | Freq: Two times a day (BID) | SUBCUTANEOUS | Status: DC
  Administered 2018-03-08: 15 [IU] via SUBCUTANEOUS
  Filled 2018-03-08 (×3): qty 0.15

## 2018-03-08 MED ORDER — APIXABAN 5 MG PO TABS
5.0000 mg | ORAL_TABLET | Freq: Two times a day (BID) | ORAL | Status: DC
  Administered 2018-03-08 (×2): 5 mg via ORAL
  Filled 2018-03-08 (×2): qty 1

## 2018-03-08 MED ORDER — VANCOMYCIN HCL IN DEXTROSE 1-5 GM/200ML-% IV SOLN
1000.0000 mg | Freq: Once | INTRAVENOUS | Status: AC
  Administered 2018-03-08: 1000 mg via INTRAVENOUS
  Filled 2018-03-08: qty 200

## 2018-03-08 MED ORDER — ALBUTEROL SULFATE (2.5 MG/3ML) 0.083% IN NEBU: 3.0000 mL | INHALATION_SOLUTION | RESPIRATORY_TRACT | Status: DC | PRN

## 2018-03-08 MED ORDER — SODIUM CHLORIDE 0.9% FLUSH
3.0000 mL | Freq: Two times a day (BID) | INTRAVENOUS | Status: DC
  Administered 2018-03-08 – 2018-03-14 (×10): 3 mL via INTRAVENOUS

## 2018-03-08 MED ORDER — VANCOMYCIN HCL 10 G IV SOLR
1250.0000 mg | INTRAVENOUS | Status: DC
  Filled 2018-03-08: qty 1250

## 2018-03-08 MED ORDER — ACETAMINOPHEN 325 MG PO TABS
650.0000 mg | ORAL_TABLET | Freq: Once | ORAL | Status: AC
  Administered 2018-03-08: 650 mg via ORAL
  Filled 2018-03-08: qty 2

## 2018-03-08 MED ORDER — ATORVASTATIN CALCIUM 20 MG PO TABS
20.0000 mg | ORAL_TABLET | Freq: Every day | ORAL | Status: DC
  Administered 2018-03-09 – 2018-03-14 (×6): 20 mg via ORAL
  Filled 2018-03-08 (×7): qty 1

## 2018-03-08 MED ORDER — ACETAMINOPHEN 650 MG RE SUPP: 650.0000 mg | Freq: Four times a day (QID) | RECTAL | Status: DC | PRN

## 2018-03-08 MED ORDER — ONDANSETRON HCL 4 MG/2ML IJ SOLN
4.0000 mg | Freq: Four times a day (QID) | INTRAMUSCULAR | Status: DC | PRN
  Administered 2018-03-08: 4 mg via INTRAVENOUS
  Filled 2018-03-08: qty 2

## 2018-03-08 MED ORDER — HYDROXYZINE HCL 25 MG PO TABS: 25.0000 mg | ORAL_TABLET | Freq: Three times a day (TID) | ORAL | Status: DC | PRN

## 2018-03-08 MED ORDER — SENNOSIDES-DOCUSATE SODIUM 8.6-50 MG PO TABS: 1.0000 | ORAL_TABLET | Freq: Every evening | ORAL | Status: DC | PRN

## 2018-03-08 MED ORDER — CLOBETASOL PROPIONATE 0.05 % EX CREA
TOPICAL_CREAM | Freq: Two times a day (BID) | CUTANEOUS | Status: DC
  Administered 2018-03-08 – 2018-03-13 (×7): via TOPICAL
  Filled 2018-03-08 (×5): qty 15

## 2018-03-08 MED ORDER — INSULIN ASPART 100 UNIT/ML ~~LOC~~ SOLN: 0.0000 [IU] | Freq: Every day | SUBCUTANEOUS | Status: DC

## 2018-03-08 MED ORDER — BUPROPION HCL ER (XL) 150 MG PO TB24
150.0000 mg | ORAL_TABLET | Freq: Every day | ORAL | Status: DC
  Administered 2018-03-08 – 2018-03-14 (×7): 150 mg via ORAL
  Filled 2018-03-08 (×7): qty 1

## 2018-03-08 MED ORDER — BISACODYL 5 MG PO TBEC: 5.0000 mg | DELAYED_RELEASE_TABLET | Freq: Every day | ORAL | Status: DC | PRN

## 2018-03-08 MED ORDER — SODIUM CHLORIDE 0.9 % IV SOLN
INTRAVENOUS | Status: AC
  Administered 2018-03-08: 05:00:00 via INTRAVENOUS

## 2018-03-08 MED ORDER — INSULIN ASPART 100 UNIT/ML ~~LOC~~ SOLN
6.0000 [IU] | Freq: Three times a day (TID) | SUBCUTANEOUS | Status: DC
  Administered 2018-03-08 – 2018-03-14 (×14): 6 [IU] via SUBCUTANEOUS
  Filled 2018-03-08 (×2): qty 1

## 2018-03-08 MED ORDER — FENTANYL CITRATE (PF) 100 MCG/2ML IJ SOLN: 25.0000 ug | INTRAMUSCULAR | Status: DC | PRN

## 2018-03-08 MED ORDER — INSULIN ASPART 100 UNIT/ML ~~LOC~~ SOLN
0.0000 [IU] | Freq: Three times a day (TID) | SUBCUTANEOUS | Status: DC
  Administered 2018-03-09 – 2018-03-10 (×2): 2 [IU] via SUBCUTANEOUS
  Administered 2018-03-10: 5 [IU] via SUBCUTANEOUS
  Administered 2018-03-10: 2 [IU] via SUBCUTANEOUS
  Administered 2018-03-11: 3 [IU] via SUBCUTANEOUS
  Administered 2018-03-11 – 2018-03-12 (×3): 2 [IU] via SUBCUTANEOUS
  Administered 2018-03-12: 3 [IU] via SUBCUTANEOUS
  Administered 2018-03-13 – 2018-03-14 (×4): 2 [IU] via SUBCUTANEOUS

## 2018-03-08 MED ORDER — SODIUM CHLORIDE 0.9 % IV SOLN
INTRAVENOUS | Status: AC
  Administered 2018-03-08: 23:00:00 via INTRAVENOUS

## 2018-03-08 MED ORDER — SODIUM CHLORIDE 0.9 % IV SOLN
500.0000 mg | Freq: Once | INTRAVENOUS | Status: AC
  Administered 2018-03-08: 500 mg via INTRAVENOUS
  Filled 2018-03-08: qty 500

## 2018-03-08 MED ORDER — ACETAMINOPHEN 325 MG PO TABS
650.0000 mg | ORAL_TABLET | Freq: Four times a day (QID) | ORAL | Status: DC | PRN
  Administered 2018-03-08: 650 mg via ORAL
  Filled 2018-03-08: qty 2

## 2018-03-08 MED ORDER — SODIUM CHLORIDE 0.9 % IV BOLUS
500.0000 mL | Freq: Once | INTRAVENOUS | Status: AC
  Administered 2018-03-08: 500 mL via INTRAVENOUS

## 2018-03-08 MED ORDER — PANTOPRAZOLE SODIUM 40 MG PO TBEC
40.0000 mg | DELAYED_RELEASE_TABLET | Freq: Every day | ORAL | Status: DC
  Administered 2018-03-08 – 2018-03-14 (×7): 40 mg via ORAL
  Filled 2018-03-08 (×7): qty 1

## 2018-03-08 MED ORDER — PREGABALIN 75 MG PO CAPS
75.0000 mg | ORAL_CAPSULE | Freq: Two times a day (BID) | ORAL | Status: DC
  Administered 2018-03-08 – 2018-03-14 (×13): 75 mg via ORAL
  Filled 2018-03-08: qty 3
  Filled 2018-03-08 (×12): qty 1

## 2018-03-08 MED ORDER — SODIUM CHLORIDE 0.9 % IV SOLN
Freq: Once | INTRAVENOUS | Status: AC
  Administered 2018-03-08: 03:00:00 via INTRAVENOUS

## 2018-03-08 MED ORDER — SODIUM CHLORIDE 0.9 % IV SOLN
700.0000 mg | INTRAVENOUS | Status: DC
  Administered 2018-03-09: 700 mg via INTRAVENOUS
  Filled 2018-03-08 (×3): qty 14

## 2018-03-08 MED ORDER — PIPERACILLIN-TAZOBACTAM 3.375 G IVPB
3.3750 g | Freq: Three times a day (TID) | INTRAVENOUS | Status: DC
  Administered 2018-03-08: 3.375 g via INTRAVENOUS
  Filled 2018-03-08 (×2): qty 50

## 2018-03-08 MED ORDER — FOLIC ACID 1 MG PO TABS
1.0000 mg | ORAL_TABLET | Freq: Every day | ORAL | Status: DC
  Administered 2018-03-08 – 2018-03-14 (×7): 1 mg via ORAL
  Filled 2018-03-08 (×7): qty 1

## 2018-03-08 MED ORDER — SODIUM CHLORIDE 0.9 % IV SOLN
2.0000 g | Freq: Four times a day (QID) | INTRAVENOUS | Status: DC
  Filled 2018-03-08 (×2): qty 2000

## 2018-03-08 MED ORDER — ONDANSETRON HCL 4 MG PO TABS: 4.0000 mg | ORAL_TABLET | Freq: Four times a day (QID) | ORAL | Status: DC | PRN

## 2018-03-08 MED ORDER — CARVEDILOL 25 MG PO TABS
25.0000 mg | ORAL_TABLET | Freq: Two times a day (BID) | ORAL | Status: DC
  Administered 2018-03-08 – 2018-03-14 (×13): 25 mg via ORAL
  Filled 2018-03-08 (×14): qty 1

## 2018-03-08 NOTE — Progress Notes (Addendum)
Patient is admitted earlier this am for febrile illness/encephalopathy/ARF.  He reports some cough, wife reports he has chronic cough, this is not new Cxr "Mild cardiomegaly without acute chest finding." ua unremarkable, he does not have diarrhea, no leukocytosis, no neck rigidity. Ct head no acute findings. Blood culture+ GPC in chains mrsa screening negative. Suspect bacteremia is from skin ( has mycosis fungoides currently under immunotherapy , last dose on 5/29) He is started on vanc and zosyn, fever coming down, heart rate improving, still drowsy.  Follow up on culture result, adjust abx as needed. Wife at bedside updated.  6:40 pm update Infectious disease recommended TEE and remove port. I send massage to cardiology through epic. I have ordered IR consult for port removal.  Called by RN due to patient is very somnolent. Rapid response RN at bedside, patient 's vital stable, on room air, abg unremarkable.  Blood glucose stable. Will cycle lactic acid , keep npo , start gentle hydration ( there is documented low EF chf, I have reviewed last echo done at [place] from 05/2017, ef was 55%) Somnolent likely due to encephalopathy from infection, will check uds and eeg for completeness.   Decrease lantus from 30unit bid to 15unit bid due to npo, on hypoglycemia protocol.  Transfer to stepdown for close monitor.

## 2018-03-08 NOTE — ED Notes (Signed)
Pt's CBG result was 118. Informed Rod Holler - RN.
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
## 2018-03-08 NOTE — Progress Notes (Signed)
Pharmacy Antibiotic Note  Eric Price is a 67 y.o. male admitted on 03/07/2018 with sepsis.  Pharmacy has been consulted for Vancocin and Zosyn dosing.  Plan: Vancomycin 1500mg  x1 then 1250mg  IV every 24 hours.  Goal trough 15-20 mcg/mL. Zosyn 3.375g IV q8h (4 hour infusion).  Height: 5\' 9"  (175.3 cm) Weight: 191 lb (86.6 kg) IBW/kg (Calculated) : 70.7  Temp (24hrs), Avg:101.3 F (38.5 C), Min:100.6 F (38.1 C), Max:101.9 F (38.8 C)  Recent Labs  Lab 03/08/18 0115 03/08/18 0118  WBC 9.1  --   CREATININE 1.96*  --   LATICACIDVEN  --  1.78    Estimated Creatinine Clearance: 39.9 mL/min (A) (by C-G formula based on SCr of 1.96 mg/dL (H)).    Allergies  Allergen Reactions  . Morphine Shortness Of Breath and Anaphylaxis  . Morphine And Related     "took my breath away"  . Oxycodone     Pt stated, "It makes me climbs walls; fight wars"     Thank you for allowing pharmacy to be a part of this patient's care.  Wynona Neat, PharmD, BCPS  03/08/2018 4:00 AM

## 2018-03-08 NOTE — Consult Note (Signed)
[facility] for Infectious Disease   Reason for Consult: Enterococcus bacteremia Referring Physician: Dr. Erlinda Hong  Principal Problem:   SIRS (systemic inflammatory response syndrome) (HCC) Active Problems:   Uncontrolled type 2 diabetes mellitus with peripheral neuropathy (HCC)   Essential hypertension   Mycosis fungoides (HCC)   NICM (nonischemic cardiomyopathy) (HCC)   CAD (coronary artery disease)   Chronic combined systolic and diastolic CHF (congestive heart failure) ([place])   Renal insufficiency   Acute encephalopathy   . apixaban  5 mg Oral BID  . atorvastatin  20 mg Oral q1800  . buPROPion  150 mg Oral Daily  . carvedilol  25 mg Oral BID WC  . clobetasol cream   Topical BID  . folic acid  1 mg Oral Daily  . insulin aspart  0-15 Units Subcutaneous TID WC  . insulin aspart  0-5 Units Subcutaneous QHS  . insulin aspart  6 Units Subcutaneous TID WC  . insulin glargine  30 Units Subcutaneous BID  . pantoprazole  40 mg Oral Daily  . pregabalin  75 mg Oral BID  . sodium chloride flush  3 mL Intravenous Q12H   Assessment/Plan: 67 year old male with PMH significant for CAD, combined systolic and diastolic CHF (EF 54-56%) s/p ICD placement, PVD on Eliquis s/p L BKA, IDDM, HTN, and Mycosis fungoides s/p chemotherapy and radiation now on immunotherapy with last infusion of Mogamulizumab on 03/02/18 who was admitted with fevers and encephalopathy secondary to enterococcus bacteremia without resistance to vancomycin.  Suspect that he has a line infection potentially from skin source given his mycosis fungoides and immunotherapy.  His urinalysis was normal suggesting this is less likely to be a GU source.  He had a normal colonoscopy in 2016.  We will continue vancomycin for now pending further susceptibilities.  If penicillin sensitive, we can narrow to ampicillin.  He will need a line holiday with removal of his right chest wall port.  We will obtain a transthoracic echocardiogram.   If this is negative he will need a transesophageal echocardiogram to assess for endocarditis and evaluate his ICD for any potential seeding and need for hardware removal.  Repeat blood cultures to be drawn on 6/6 AM are ordered.   Antibiotics: Vancomycin 6/3 >> Zosyn 6/3 >> 6/4  HPI:  Eric Price is a 67 y.o. male with PMH significant for CAD, combined systolic and diastolic CHF (EF 25-63%) s/p ICD placement, PVD on Eliquis s/p L BKA, IDDM, HTN, and Mycosis fungoides s/p chemotherapy and radiation now on immunotherapy with last infusion of Mogamulizumab on 03/02/18 who presented to the [facility] ED on 6/3 with fevers and encephalopathy.  Wife is bedside who provides majority of history as patient is unable to provide on his own additional history is obtained by chart review.    His wife states that he was doing well for several days of visit after his most recent immunotherapy infusion until about 3 days ago when he began to become more fatigued and lethargic.  2 days ago he spent all of his time in bed except to wake up to watch the [employer].  Last night he became more significantly altered and lethargic therefore his wife called EMS.  She says prior to this he was able to complete all his daily activities including eating, bathing, and dressing himself.  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  He has a right-sided chest port which was placed in August or September 2018 per  wife.  In the ED, he was febrile to 101.77F, tachycardic, with stable blood pressure.  Chest x-ray was without any consolidation or infiltrate.  Urinalysis was negative.  Influenza swab was negative.  HIV antibody was nonreactive.  WBC was 9.1 compared to 6.8 on 5/29.  Creatinine was elevated to 1.9 compared to recent baseline of 1.4.  Blood cultures were drawn and he was started on empiric vancomycin and Zosyn.  A noncontrast CT head was obtained without any acute pathology.  Renal ultrasound was  normal.  Blood cultures have returned positive today for enterococcus in 2 of 2 cultures.  Review of Systems:  Unable to be assessed due to mental status   Past Medical History:  Diagnosis Date  . Arthritis    knees, shoulder, hands   . Chronic combined systolic and diastolic CHF (congestive heart failure) ([place]) 07/17/2016  . Coronary artery disease   . Critical lower limb ischemia   . Depression   . Hyperlipidemia   . Hypertension   . Mycosis fungoides ([place])    ALK negative; TCR positive; CD30 positive, CD3 positive.   . Nonischemic cardiomyopathy ([place])   . Peripheral vascular disease ([place])   . Pneumonia 2013   hosp. - MCH x1 week   . Renal insufficiency 03/08/2018  . Shortness of breath dyspnea    related to pain currently  . Sleep apnea    10-20 yrs. ago, states he used CPAP, not needed anymore.   . Type II diabetes mellitus ([place])     Social History   Tobacco Use  . Smoking status: Never Smoker  . Smokeless tobacco: Never Used  Substance Use Topics  . Alcohol use: No    Alcohol/week: 0.0 oz  . Drug use: No    Family History  Problem Relation Age of Onset  . CAD Father 57       Died 67  . Hypertension Father   . Heart failure Mother 72  . Diabetes Maternal Grandmother   . CAD Paternal Grandfather 34  . Colon cancer Neg Hx   . Esophageal cancer Neg Hx   . Stomach cancer Neg Hx   . Rectal cancer Neg Hx     Allergies  Allergen Reactions  . Morphine Shortness Of Breath and Anaphylaxis  . Morphine And Related     "took my breath away"  . Oxycodone     Pt stated, "It makes me climbs walls; fight wars"    Physical Exam: Constitutional: lethargic, falling asleep with intermittent eye-opening Vitals:   03/08/18 1508 03/08/18 1536  BP:  120/73  Pulse:  96  Resp:  18  Temp: 98.1 F (36.7 C) 99 F (37.2 C)  SpO2:  98%   EYES: anicteric, pupils equal round reactive to light ENMT: Cardiovascular: Cor RRR Respiratory: clear; GI: Bowel sounds are  normal, liver is not enlarged, spleen is not enlarged Musculoskeletal: Right chest wall port in place, ICD in place left chest wall, left BKA with well-healed surgical site Skin: Scarring bilateral upper extremities   Lab Results  Component Value Date   WBC 9.0 03/08/2018   HGB 10.9 (L) 03/08/2018   HCT 33.5 (L) 03/08/2018   MCV 82.5 03/08/2018   PLT 214 03/08/2018    Lab Results  Component Value Date   CREATININE 1.96 (H) 03/08/2018   BUN 27 (H) 03/08/2018   NA 134 (L) 03/08/2018   K 4.0 03/08/2018   CL 95 (L) 03/08/2018   CO2 26 03/08/2018    Lab  Results  Component Value Date   ALT 15 (L) 03/08/2018   AST 14 (L) 03/08/2018   ALKPHOS 144 (H) 03/08/2018     Microbiology: Recent Results (from the past 240 hour(s))  Blood Culture (routine x 2)     Status: None (Preliminary result)   Collection Time: 03/08/18  1:10 AM  Result Value Ref Range Status   Specimen Description BLOOD RIGHT ANTECUBITAL  Final   Special Requests   Final    BOTTLES DRAWN AEROBIC AND ANAEROBIC Blood Culture adequate volume   Culture  Setup Time   Final    GRAM POSITIVE COCCI IN BOTH AEROBIC AND ANAEROBIC BOTTLES CRITICAL VALUE NOTED.  VALUE IS CONSISTENT WITH PREVIOUSLY REPORTED AND CALLED VALUE. Performed at [facility], [place] [address], [place], [place] [address]    Culture GRAM POSITIVE COCCI  Final   Report Status PENDING  Incomplete  Blood Culture (routine x 2)     Status: None (Preliminary result)   Collection Time: 03/08/18  1:27 AM  Result Value Ref Range Status   Specimen Description BLOOD RIGHT PORTA CATH  Final   Special Requests   Final    BOTTLES DRAWN AEROBIC AND ANAEROBIC Blood Culture adequate volume   Culture  Setup Time   Final    GRAM POSITIVE COCCI IN PAIRS IN CHAINS IN BOTH AEROBIC AND ANAEROBIC BOTTLES Organism ID to follow CRITICAL RESULT CALLED TO, READ BACK BY AND VERIFIED WITH: Ferne Coe PharmD 14:30 03/08/18 ([email or web address]) Performed at [facility], [address] [place], [place] [address]    Culture GRAM POSITIVE COCCI  Final   Report Status PENDING  Incomplete  Blood Culture ID Panel (Reflexed)     Status: Abnormal   Collection Time: 03/08/18  1:27 AM  Result Value Ref Range Status   Enterococcus species DETECTED (A) NOT DETECTED Final    Comment: CRITICAL RESULT CALLED TO, READ BACK BY AND VERIFIED WITH: Ferne Coe PharmD 14:30 03/08/18 ([email or web address])    Vancomycin resistance NOT DETECTED NOT DETECTED Final   Listeria monocytogenes NOT DETECTED NOT DETECTED Final   Staphylococcus species NOT DETECTED NOT DETECTED Final   Staphylococcus aureus NOT DETECTED NOT DETECTED Final   Streptococcus species NOT DETECTED NOT DETECTED Final   Streptococcus agalactiae NOT DETECTED NOT DETECTED Final   Streptococcus pneumoniae NOT DETECTED NOT DETECTED Final   Streptococcus pyogenes NOT DETECTED NOT DETECTED Final   Acinetobacter baumannii NOT DETECTED NOT DETECTED Final   Enterobacteriaceae species NOT DETECTED NOT DETECTED Final   Enterobacter cloacae complex NOT DETECTED NOT DETECTED Final   Escherichia coli NOT DETECTED NOT DETECTED Final   Klebsiella oxytoca NOT DETECTED NOT DETECTED Final   Klebsiella pneumoniae NOT DETECTED NOT DETECTED Final   Proteus species NOT DETECTED NOT DETECTED Final   Serratia marcescens NOT DETECTED NOT DETECTED Final   Haemophilus influenzae NOT DETECTED NOT DETECTED Final   Neisseria meningitidis NOT DETECTED NOT DETECTED Final   Pseudomonas aeruginosa NOT DETECTED NOT DETECTED Final   Candida albicans NOT DETECTED NOT DETECTED Final   Candida glabrata NOT DETECTED NOT DETECTED Final   Candida krusei NOT DETECTED NOT DETECTED Final   Candida parapsilosis NOT DETECTED NOT DETECTED Final   Candida tropicalis NOT DETECTED NOT DETECTED Final    Comment: Performed at [facility], [address] [place], [place] [address]  MRSA PCR Screening     Status: None   Collection Time: 03/08/18  9:51 AM  Result Value Ref  Range  Status   MRSA by PCR NEGATIVE NEGATIVE Final    Comment:        The GeneXpert MRSA Assay (FDA approved for NASAL specimens only), is one component of a comprehensive MRSA colonization surveillance program. It is not intended to diagnose MRSA infection nor to guide or monitor treatment for MRSA infections. Performed at [facility], [place] [address] [place], [place] [address]     Zada Finders, MD Internal Medicine PGY-3 03/08/2018, 4:28 PM

## 2018-03-08 NOTE — ED Triage Notes (Signed)
CC of weakness and dizziness. Alert and oriented x4. Pt slow to respond. No unilateral weakness noticed by EMS. Pt orthostatic  Negative. Pt actively receiving chemo for skin cancer, once a week. Pt missed last chemo.

## 2018-03-08 NOTE — Progress Notes (Signed)
PHARMACY - PHYSICIAN COMMUNICATION CRITICAL VALUE ALERT - BLOOD CULTURE IDENTIFICATION (BCID)  Eric Price is an 67 y.o. male who presented to [facility] on 03/07/2018 with a chief complaint of fever, cough, confusion.  Assessment: 57 YOM with hx mycosis fungoides managed with chemo/radiation who presented with generalized weakness, malaise, and fevers and was starting on empiric antibiotics for r/o sepsis. Now with 2/2 blood cultures growing gram positive cocci in pairs/chains with BCID showing Enteroccocus. The patient has a noted history of ICD and current chemo/radiation. ID will be automatically consulted.   Name of physician (or Provider) Contacted: Xu  Current antibiotics: Vancomycin + Zosyn  Changes to prescribed antibiotics recommended:  D/c Vancomycin and Zosyn and narrow to Ampicillin 2g IV every 6 hours (renally adjusted). ID to see and likely to add synergy at that time.   Results for orders placed or performed during the hospital encounter of 03/07/18  Blood Culture ID Panel (Reflexed) (Collected: 03/08/2018  1:27 AM)  Result Value Ref Range   Enterococcus species DETECTED (A) NOT DETECTED   Vancomycin resistance NOT DETECTED NOT DETECTED   Listeria monocytogenes NOT DETECTED NOT DETECTED   Staphylococcus species NOT DETECTED NOT DETECTED   Staphylococcus aureus NOT DETECTED NOT DETECTED   Streptococcus species NOT DETECTED NOT DETECTED   Streptococcus agalactiae NOT DETECTED NOT DETECTED   Streptococcus pneumoniae NOT DETECTED NOT DETECTED   Streptococcus pyogenes NOT DETECTED NOT DETECTED   Acinetobacter baumannii NOT DETECTED NOT DETECTED   Enterobacteriaceae species NOT DETECTED NOT DETECTED   Enterobacter cloacae complex NOT DETECTED NOT DETECTED   Escherichia coli NOT DETECTED NOT DETECTED   Klebsiella oxytoca NOT DETECTED NOT DETECTED   Klebsiella pneumoniae NOT DETECTED NOT DETECTED   Proteus species NOT DETECTED NOT DETECTED   Serratia marcescens NOT DETECTED NOT  DETECTED   Haemophilus influenzae NOT DETECTED NOT DETECTED   Neisseria meningitidis NOT DETECTED NOT DETECTED   Pseudomonas aeruginosa NOT DETECTED NOT DETECTED   Candida albicans NOT DETECTED NOT DETECTED   Candida glabrata NOT DETECTED NOT DETECTED   Candida krusei NOT DETECTED NOT DETECTED   Candida parapsilosis NOT DETECTED NOT DETECTED   Candida tropicalis NOT DETECTED NOT DETECTED    Lawson Radar 03/08/2018  3:52 PM

## 2018-03-08 NOTE — Significant Event (Signed)
Rapid Response Event Note  Overview: Time Called: 9458 Arrival Time: 5929 Event Type: Neurologic  Initial Focused Assessment: Patient admitted last night with bacteremia and encephalopathy. Per wife he has been asleep most of the day only waking at mealtime. Arrived to 71m11 from the ED about 3:30pm this afternoon.  Per RN he was fully was fully awake and oriented when he arrived on the unit.    Upon my arrival he will open his eyes to stimulation but falls rapidly back to sleep. Does not stay awake long enough to follow commands.  BP 112/80  HR 102  RR 21  O2 sat 99% on RA Lung sounds clear, decreased bases.    Interventions: ABG done  7.39/42/75/25  Plan:  Transfer to SDU            Repeat lactic acid            MIVF   Plan of Care (if not transferred):  Event Summary: Name of Physician Notified: Dr Erlinda Hong at 1751    at    Outcome: Transferred (Comment)(2w)  Event End Time: [place]  Raliegh Ip

## 2018-03-08 NOTE — ED Notes (Signed)
Heart healthy/carb modified lunch tray ordered 

## 2018-03-08 NOTE — Progress Notes (Addendum)
Patient was alert and oriented on arrival to the unit. Patient ate dinner and became  lethargic after his dinner. Vitals were taken  112/80 98.2, 102, 12. Rapid nurse was called. MD was called, blood gas was ordered. Will continue to monitor. Patient will be transferred to  2W.

## 2018-03-08 NOTE — Progress Notes (Signed)
ANTIBIOTIC CONSULT NOTE - INITIAL  Pharmacy Consult for Daptomycin Indication: Enterococcus bacteremia  Allergies  Allergen Reactions  . Morphine Shortness Of Breath and Anaphylaxis  . Morphine And Related     "took my breath away"  . Oxycodone     Pt stated, "It makes me climbs walls; fight wars"    Patient Measurements: Height: 5' 9"  (175.3 cm) Weight: 191 lb (86.6 kg) IBW/kg (Calculated) : 70.7 Adjusted Body Weight:    Vital Signs: Temp: 99 F (37.2 C) (06/04 1536) Temp Source: Oral (06/04 1536) BP: 120/73 (06/04 1536) Pulse Rate: 96 (06/04 1536) Intake/Output from previous day: 06/03 0701 - 06/04 0700 In: 553 [I.V.:3; IV Piggyback:550] Out: -  Intake/Output from this shift: Total I/O In: 791.8 [I.V.:741.8; IV Piggyback:50] Out: -   Labs: Recent Labs    03/08/18 0115 03/08/18 0349  WBC 9.1 9.0  HGB 12.4* 10.9*  PLT 249 214  CREATININE 1.96* 1.96*   Estimated Creatinine Clearance: 39.9 mL/min (A) (by C-G formula based on SCr of 1.96 mg/dL (H)). No results for input(s): VANCOTROUGH, VANCOPEAK, VANCORANDOM, GENTTROUGH, GENTPEAK, GENTRANDOM, TOBRATROUGH, TOBRAPEAK, TOBRARND, AMIKACINPEAK, AMIKACINTROU, AMIKACIN in the last 72 hours.   Microbiology:   Medical History: Past Medical History:  Diagnosis Date  . Arthritis    knees, shoulder, hands   . Chronic combined systolic and diastolic CHF (congestive heart failure) ([place]) 07/17/2016  . Coronary artery disease   . Critical lower limb ischemia   . Depression   . Hyperlipidemia   . Hypertension   . Mycosis fungoides ([place])    ALK negative; TCR positive; CD30 positive, CD3 positive.   . Nonischemic cardiomyopathy ([place])   . Peripheral vascular disease ([place])   . Pneumonia 2013   hosp. - MCH x1 week   . Renal insufficiency 03/08/2018  . Shortness of breath dyspnea    related to pain currently  . Sleep apnea    10-20 yrs. ago, states he used CPAP, not needed anymore.   . Type II diabetes mellitus ([place])     Assessment: CC/HPI:  Eric Price is a 67 y.o. male admitted on 03/07/2018 with sepsis.  PMH: DM, peripheral neuropathy, HTN, mycosis fungoides s/p chemotherapy and radiation, arthritis, depression, HLD, OSA, NICM, ICD,  CAD, HF, CRI, PVD on Eliquis s/p L BKA  Significant events: Mycosis fungoides s/p chemotherapy and radiation now on immunotherapy with last infusion of Mogamulizumab on 03/02/18    ID: ABX for sepsis. Immunocompromised. Suspect line infection. Tmax 101.9, Tc 99, WBC 9. Scr 39 (Scr 1.96 up from Feb 2019)  Dapto 6/4>> Vanco 6/4>>6/4 Zosyn 6/4>>6/4  6/4 BC>> GPC x 2: Enterococcus   Goal of Therapy:  Eradication of infection  Plan:  Line holiday. TTE Daptomycin 28m/kg q 24 hrs= 7077m24h CK recommended at least weekly, but more often with renal dysfunction (ordered q 72 hrs)  Eric Price S. RoAlford HighlandPharmD, BCLottlinical Staff Pharmacist Pager [phone] Ghazitillinger 03/08/2018,5:25 PM
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
## 2018-03-08 NOTE — ED Notes (Signed)
ED Provider at bedside. 

## 2018-03-08 NOTE — ED Notes (Signed)
Pt moved to hospital bed with some assistance, pt stating "I am still feeling weak."

## 2018-03-09 ENCOUNTER — Inpatient Hospital Stay (HOSPITAL_COMMUNITY): Payer: Medicare HMO

## 2018-03-09 DIAGNOSIS — C8400 Mycosis fungoides, unspecified site: Secondary | ICD-10-CM

## 2018-03-09 DIAGNOSIS — I10 Essential (primary) hypertension: Secondary | ICD-10-CM

## 2018-03-09 DIAGNOSIS — E1142 Type 2 diabetes mellitus with diabetic polyneuropathy: Secondary | ICD-10-CM

## 2018-03-09 DIAGNOSIS — E1165 Type 2 diabetes mellitus with hyperglycemia: Secondary | ICD-10-CM

## 2018-03-09 DIAGNOSIS — I5042 Chronic combined systolic (congestive) and diastolic (congestive) heart failure: Secondary | ICD-10-CM

## 2018-03-09 DIAGNOSIS — N179 Acute kidney failure, unspecified: Secondary | ICD-10-CM

## 2018-03-09 LAB — COMPREHENSIVE METABOLIC PANEL
ALK PHOS: 104 U/L (ref 38–126)
ALT: 22 U/L (ref 17–63)
AST: 18 U/L (ref 15–41)
Albumin: 3.2 g/dL — ABNORMAL LOW (ref 3.5–5.0)
Anion gap: 5 (ref 5–15)
BUN: 19 mg/dL (ref 6–20)
CHLORIDE: 105 mmol/L (ref 101–111)
CO2: 28 mmol/L (ref 22–32)
CREATININE: 1.33 mg/dL — AB (ref 0.61–1.24)
Calcium: 8.8 mg/dL — ABNORMAL LOW (ref 8.9–10.3)
GFR, EST NON AFRICAN AMERICAN: 54 mL/min — AB (ref >60)
Glucose, Bld: 101 mg/dL — ABNORMAL HIGH (ref 65–99)
Potassium: 3.9 mmol/L (ref 3.5–5.1)
Sodium: 138 mmol/L (ref 135–145)
Total Bilirubin: 0.5 mg/dL (ref 0.3–1.2)
Total Protein: 6.2 g/dL — ABNORMAL LOW (ref 6.5–8.1)

## 2018-03-09 LAB — GLUCOSE, CAPILLARY
GLUCOSE-CAPILLARY: 105 mg/dL — AB (ref 65–99)
GLUCOSE-CAPILLARY: 127 mg/dL — AB (ref 65–99)
GLUCOSE-CAPILLARY: 193 mg/dL — AB (ref 65–99)
Glucose-Capillary: 112 mg/dL — ABNORMAL HIGH (ref 65–99)

## 2018-03-09 LAB — CBC
HCT: 30.2 % — ABNORMAL LOW (ref 39.0–52.0)
Hemoglobin: 9.8 g/dL — ABNORMAL LOW (ref 13.0–17.0)
MCH: 26.5 pg (ref 26.0–34.0)
MCHC: 32.5 g/dL (ref 30.0–36.0)
MCV: 81.6 fL (ref 78.0–100.0)
PLATELETS: 184 10*3/uL (ref 150–400)
RBC: 3.7 MIL/uL — AB (ref 4.22–5.81)
RDW: 14.2 % (ref 11.5–15.5)
WBC: 5.5 10*3/uL (ref 4.0–10.5)

## 2018-03-09 LAB — CK: Total CK: 49 U/L (ref 49–397)

## 2018-03-09 LAB — PROTIME-INR
INR: 1.29
Prothrombin Time: 16 seconds — ABNORMAL HIGH (ref 11.4–15.2)

## 2018-03-09 LAB — ECHOCARDIOGRAM COMPLETE
Height: 69 in
WEIGHTICAEL: 3470.92 [oz_av]

## 2018-03-09 LAB — URIC ACID: URIC ACID, SERUM: 6.9 mg/dL (ref 4.4–7.6)

## 2018-03-09 LAB — PHOSPHORUS: PHOSPHORUS: 3.7 mg/dL (ref 2.5–4.6)

## 2018-03-09 LAB — HEMOGLOBIN A1C
Hgb A1c MFr Bld: 6.3 % — ABNORMAL HIGH (ref 4.8–5.6)
Mean Plasma Glucose: 134.11 mg/dL

## 2018-03-09 LAB — LACTIC ACID, PLASMA: Lactic Acid, Venous: 0.7 mmol/L (ref 0.5–1.9)

## 2018-03-09 MED ORDER — INSULIN GLARGINE 100 UNIT/ML ~~LOC~~ SOLN
15.0000 [IU] | Freq: Every day | SUBCUTANEOUS | Status: DC
  Administered 2018-03-09 – 2018-03-13 (×5): 15 [IU] via SUBCUTANEOUS
  Filled 2018-03-09 (×7): qty 0.15

## 2018-03-09 MED ORDER — SODIUM CHLORIDE 0.9 % IV SOLN
INTRAVENOUS | Status: DC
  Administered 2018-03-09: 17:00:00 via INTRAVENOUS

## 2018-03-09 MED ORDER — APIXABAN 5 MG PO TABS: 5.0000 mg | ORAL_TABLET | Freq: Two times a day (BID) | ORAL | Status: DC

## 2018-03-09 MED ORDER — TIZANIDINE HCL 2 MG PO TABS
2.0000 mg | ORAL_TABLET | Freq: Four times a day (QID) | ORAL | Status: DC | PRN
  Administered 2018-03-09 – 2018-03-14 (×6): 2 mg via ORAL
  Filled 2018-03-09 (×7): qty 1

## 2018-03-09 NOTE — Progress Notes (Signed)
2D Echocardiogram has been performed.  Eric Price 03/09/2018, 12:47 PM

## 2018-03-09 NOTE — Progress Notes (Signed)
PROGRESS NOTE    Eric Price  WTU:[id number] DOB: 07-24-1951 DOA: 03/07/2018 PCP: [name], MD    Brief Narrative:  67 year old male who presented with fever, confusion and cough.  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  Reported fevers for last few days prior to hospitalization, associated with generalized weakness, and malaise.  On the initial physical examination his temperature was 38.8 Celsius, heart rate 117 222, blood pressure 145/86, respiratory 28, oxygen saturation 94%.  Moist mucous membranes, lungs clear to auscultation bilaterally, heart S1-S2 present, tachycardic, abdomen soft nontender, lower extremities with no edema, left BKA.  Sodium 134, potassium 4.0, chloride 94, bicarb 28, glucose 80, BUN 27, creatinine 1.96, venous lactic acid 1.7, white count 9.1, hemoglobin 12.4, hematocrit 38.4, platelets 249, arterial blood gas 7.39/ 42.8/75.1/25.3/94%.  Urinalysis negative for infection.  Drug screen negative.  Head CT negative for acute changes.  Chest x-ray, hypoinflated, no infiltrates, right internal jugular vein catheter, left-sided pacemaker.  EKG, sinus rhythm, left axis, normal intervals.  Patient was admitted to the hospital with the working diagnosis of systemic inflammatory response syndrome to rule out sepsis, complicated by metabolic encephalopathy and acute kidney injury.  Blood cultures from 6/4 resulted positive for enterococcus.   Assessment & Plan:   Principal Problem:   SIRS (systemic inflammatory response syndrome) (HCC) Active Problems:   Uncontrolled type 2 diabetes mellitus with peripheral neuropathy (HCC)   Essential hypertension   Mycosis fungoides (HCC)   NICM (nonischemic cardiomyopathy) (HCC)   CAD (coronary artery disease)   Chronic combined systolic and diastolic CHF  (congestive heart failure) ([place])   Renal insufficiency   Acute encephalopathy   1. Enterococcus bacteremia rule out port infection. Suspected port infection, IR consulted for removal, follow on transthoracic echocardiogram, may need TEE if negative. Continue to follow up cell count and temperature curve. Continue antibiotic therapy with IV daptomycin. Hold on apixaban for now.   2. AKI. Improved renal function with serum cr down to 1,3 from 1,9, K at 3,9 and Na at 139, will continue close follow up on renal function and electrolytes. Off IV fluids.   3. Metabolic encephalopathy. Improved mentation, no further confusion or agitation. Will continue neuro checks per unit protocol.   4. Diastolic heart failure. Chronic and stable, no signs of decompensation, continue blood pressure control. Continue carvedilol.   5. Mycosis fungoides. On chemotherapy, last treatment at the end of may. Follow up as outpatient at [facility]. Change tizanidine to as needed, continue pregabalin.   6. T2DM. Will continue glucose cover and monitoring with insulin sliding scale, capillary glucose 118, 117, 105, 112. Patient npo for possible removal of port, will decrease basal insulin to 15 units glargine qhs to prevent hypoglycemia.   7. PVD. Will hold on apixaban for now.   DVT prophylaxis: enoxaparin   Code Status:  full Family Communication: no family at the bedside  Disposition Plan:  Home    Consultants:   ID  Procedures:     Antimicrobials:   Daptomycin     Subjective: Patient is awake and alert, no confusion or agitation, no nausea or vomiting, has been npo after midnight. No pain at the catheter site.   Objective: Vitals:   03/09/18 0400 03/09/18 0500 03/09/18 0600 03/09/18 0742  BP:    110/79  Pulse: (!) 109 (!) 111 (!) 113 (!) 109  Resp: 20 [place] Kitchen)  23 (!) 24 18  Temp:      TempSrc:      SpO2: 99% 96% 98% 98%  Weight:  98.4 kg (216 lb 14.9 oz)    Height:        Intake/Output Summary (Last  24 hours) at 03/09/2018 0749 Last data filed at 03/09/2018 0600 Gross per 24 hour  Intake 1344.75 ml  Output -  Net 1344.75 ml   Filed Weights   03/08/18 0120 03/09/18 0500  Weight: 86.6 kg (191 lb) 98.4 kg (216 lb 14.9 oz)    Examination:   General: deconditioned  Neurology: Awake and alert, non focal  E ENT: mild pallor, no icterus, oral mucosa moist Cardiovascular: No JVD. S1-S2 present, rhythmic, no gallops, rubs, or murmurs. No lower extremity edema. Pulmonary: decreased breath sounds bilaterally at bases, adequate air movement, no wheezing, rhonchi or rales. Gastrointestinal. Abdomen with no organomegaly, non tender, no rebound or guarding Skin. No rashes Musculoskeletal: no joint deformities.; right BKA.      Data Reviewed: I have personally reviewed following labs and imaging studies  CBC: Recent Labs  Lab 03/08/18 0115 03/08/18 0349 03/09/18 0437  WBC 9.1 9.0 5.5  NEUTROABS 8.0*  --   --   HGB 12.4* 10.9* 9.8*  HCT 38.4* 33.5* 30.2*  MCV 81.5 82.5 81.6  PLT 249 214 623   Basic Metabolic Panel: Recent Labs  Lab 03/08/18 0115 03/08/18 0349 03/09/18 0437  NA 134* 134* 138  K 4.0 4.0 3.9  CL 94* 95* 105  CO2 28 26 28   GLUCOSE 83 104* 101*  BUN 27* 27* 19  CREATININE 1.96* 1.96* 1.33*  CALCIUM 9.8 9.2 8.8*  PHOS  --   --  3.7   GFR: Estimated Creatinine Clearance: 62.4 mL/min (A) (by C-G formula based on SCr of 1.33 mg/dL (H)). Liver Function Tests: Recent Labs  Lab 03/08/18 0115 03/09/18 0437  AST 14* 18  ALT 15* 22  ALKPHOS 144* 104  BILITOT 0.6 0.5  PROT 7.5 6.2*  ALBUMIN 4.2 3.2*   No results for input(s): LIPASE, AMYLASE in the last 168 hours. No results for input(s): AMMONIA in the last 168 hours. Coagulation Profile: No results for input(s): INR, PROTIME in the last 168 hours. Cardiac Enzymes: Recent Labs  Lab 03/09/18 0437  CKTOTAL 49   BNP (last 3 results) No results for input(s): PROBNP in the last 8760 hours. HbA1C: Recent  Labs    03/09/18 0437  HGBA1C 6.3*   CBG: Recent Labs  Lab 03/08/18 0808 03/08/18 1159 03/08/18 1633 03/08/18 2226 03/09/18 0741  GLUCAP 109* 118* 117* 103* 105*   Lipid Profile: No results for input(s): CHOL, HDL, LDLCALC, TRIG, CHOLHDL, LDLDIRECT in the last 72 hours. Thyroid Function Tests: No results for input(s): TSH, T4TOTAL, FREET4, T3FREE, THYROIDAB in the last 72 hours. Anemia Panel: No results for input(s): VITAMINB12, FOLATE, FERRITIN, TIBC, IRON, RETICCTPCT in the last 72 hours.    Radiology Studies: I have reviewed all of the imaging during this hospital visit personally     Scheduled Meds: . apixaban  5 mg Oral BID  . atorvastatin  20 mg Oral q1800  . buPROPion  150 mg Oral Daily  . carvedilol  25 mg Oral BID WC  . clobetasol cream   Topical BID  . folic acid  1 mg Oral Daily  . insulin aspart  0-15 Units Subcutaneous TID WC  . insulin aspart  0-5 Units Subcutaneous QHS  . insulin aspart  6 Units Subcutaneous  TID WC  . insulin glargine  15 Units Subcutaneous BID  . pantoprazole  40 mg Oral Daily  . pregabalin  75 mg Oral BID  . sodium chloride flush  3 mL Intravenous Q12H   Continuous Infusions: . DAPTOmycin (CUBICIN)  IV       LOS: 1 day        Tawni Millers, MD [facility] Pager [phone]
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
## 2018-03-09 NOTE — Progress Notes (Signed)
    [facility] has been requested to perform a transesophageal echocardiogram on [name] for bacteremia.  After careful review of history and examination, the risks and benefits of transesophageal echocardiogram have been explained including risks of esophageal damage, perforation (1:10,000 risk), bleeding, pharyngeal hematoma as well as other potential complications associated with conscious sedation including aspiration, arrhythmia, respiratory failure and death. Alternatives to treatment were discussed, questions were answered. Patient is willing to proceed.   Kathyrn Drown, NP  03/09/2018 2:06 PM

## 2018-03-09 NOTE — Progress Notes (Signed)
Eric Price for Infectious Disease   Date of Admission:  03/07/2018  Antibiotics: Vancomycin 6/4 Zosyn 6/4 Daptomycin 6/5 >>  Subjective: Transferred to SDU last night due to excess somnolence and minimal interaction. Vancomycin switched to Daptomycin (not given yet) due to renal dysfunction. Afebrile overnight. This morning he is awake, alert, and oriented to person, place, and time. EEG is being performed. He does not recall much of the events from yesterday, but denies any further fevers, diaphoresis, or fatigue. 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   Objective:  Vitals:   03/09/18 0600 03/09/18 0742  BP:  110/79  Pulse: (!) 113 (!) 109  Resp: (!) 24 18  Temp:  98.8 F (37.1 C)  SpO2: 98% 98%  General: resting in bed, no acute distress, EEG being performed HEENT: [place]/AT Cardiac: RRR, no rubs, murmurs or gallops, right chest porta cath in place C/D/I, ICD left chest wall without erythema or tenderness Pulm: clear to auscultation anteriorly Abd: soft, nontender, nondistended Ext: s/p L BKA Neuro: awake, alert, and oriented X3   Lab Results Lab Results  Component Value Date   WBC 5.5 03/09/2018   HGB 9.8 (L) 03/09/2018   HCT 30.2 (L) 03/09/2018   MCV 81.6 03/09/2018   PLT 184 03/09/2018    Lab Results  Component Value Date   CREATININE 1.33 (H) 03/09/2018   BUN 19 03/09/2018   NA 138 03/09/2018   K 3.9 03/09/2018   CL 105 03/09/2018   CO2 28 03/09/2018    Lab Results  Component Value Date   ALT 22 03/09/2018   AST 18 03/09/2018   ALKPHOS 104 03/09/2018   BILITOT 0.5 03/09/2018      Microbiology: Recent Results (from the past 240 hour(s))  Blood Culture (routine x 2)     Status: None (Preliminary result)   Collection Time: 03/08/18  1:10 AM  Result Value Ref Range Status   Specimen Description BLOOD RIGHT ANTECUBITAL  Final   Special Requests   Final    BOTTLES DRAWN AEROBIC AND ANAEROBIC Blood Culture adequate volume   Culture  Setup Time    Final    GRAM POSITIVE COCCI IN BOTH AEROBIC AND ANAEROBIC BOTTLES CRITICAL VALUE NOTED.  VALUE IS CONSISTENT WITH PREVIOUSLY REPORTED AND CALLED VALUE. Performed at [facility], [place] [address] [place], [place] [address]    Culture GRAM POSITIVE COCCI  Final   Report Status PENDING  Incomplete  Blood Culture (routine x 2)     Status: None (Preliminary result)   Collection Time: 03/08/18  1:27 AM  Result Value Ref Range Status   Specimen Description BLOOD RIGHT PORTA CATH  Final   Special Requests   Final    BOTTLES DRAWN AEROBIC AND ANAEROBIC Blood Culture adequate volume   Culture  Setup Time   Final    GRAM POSITIVE COCCI IN PAIRS IN CHAINS IN BOTH AEROBIC AND ANAEROBIC BOTTLES Organism ID to follow CRITICAL RESULT CALLED TO, READ BACK BY AND VERIFIED WITH: Ferne Coe PharmD 14:30 03/08/18 ([email or web address])    Culture   Final    GRAM POSITIVE COCCI SUSCEPTIBILITIES TO FOLLOW Performed at [facility], [place] [address] [place],  [address]    Report Status PENDING  Incomplete  Blood Culture ID Panel (Reflexed)     Status: Abnormal   Collection Time: 03/08/18  1:27 AM  Result Value Ref Range Status   Enterococcus species DETECTED (A) NOT DETECTED Final  Comment: CRITICAL RESULT CALLED TO, READ BACK BY AND VERIFIED WITH: Ferne Coe PharmD 14:30 03/08/18 ([email or web address])    Vancomycin resistance NOT DETECTED NOT DETECTED Final   Listeria monocytogenes NOT DETECTED NOT DETECTED Final   Staphylococcus species NOT DETECTED NOT DETECTED Final   Staphylococcus aureus NOT DETECTED NOT DETECTED Final   Streptococcus species NOT DETECTED NOT DETECTED Final   Streptococcus agalactiae NOT DETECTED NOT DETECTED Final   Streptococcus pneumoniae NOT DETECTED NOT DETECTED Final   Streptococcus pyogenes NOT DETECTED NOT DETECTED Final   Acinetobacter baumannii NOT DETECTED NOT DETECTED Final   Enterobacteriaceae species NOT DETECTED NOT DETECTED Final   Enterobacter cloacae complex NOT  DETECTED NOT DETECTED Final   Escherichia coli NOT DETECTED NOT DETECTED Final   Klebsiella oxytoca NOT DETECTED NOT DETECTED Final   Klebsiella pneumoniae NOT DETECTED NOT DETECTED Final   Proteus species NOT DETECTED NOT DETECTED Final   Serratia marcescens NOT DETECTED NOT DETECTED Final   Haemophilus influenzae NOT DETECTED NOT DETECTED Final   Neisseria meningitidis NOT DETECTED NOT DETECTED Final   Pseudomonas aeruginosa NOT DETECTED NOT DETECTED Final   Candida albicans NOT DETECTED NOT DETECTED Final   Candida glabrata NOT DETECTED NOT DETECTED Final   Candida krusei NOT DETECTED NOT DETECTED Final   Candida parapsilosis NOT DETECTED NOT DETECTED Final   Candida tropicalis NOT DETECTED NOT DETECTED Final    Comment: Performed at [facility], [address] [place], [place] [address]  MRSA PCR Screening     Status: None   Collection Time: 03/08/18  9:51 AM  Result Value Ref Range Status   MRSA by PCR NEGATIVE NEGATIVE Final    Comment:        The GeneXpert MRSA Assay (FDA approved for NASAL specimens only), is one component of a comprehensive MRSA colonization surveillance program. It is not intended to diagnose MRSA infection nor to guide or monitor treatment for MRSA infections. Performed at [facility], [place] [address] [place], [place] [address]     Studies/Results: Ct Head Wo Contrast  Result Date: 03/08/2018 CLINICAL DATA:  Weakness and dizziness. History of hypertension, diabetes, skin cancer. EXAM: CT HEAD WITHOUT CONTRAST TECHNIQUE: Contiguous axial images were obtained from the base of the skull through the vertex without intravenous contrast. COMPARISON:  None. FINDINGS: BRAIN: No intraparenchymal hemorrhage, mass effect nor midline shift. The ventricles and sulci are normal for age. Patchy supratentorial white matter hypodensities of unexpected for patient's age, though non-specific are most compatible with chronic small vessel ischemic disease. No  acute large vascular territory infarcts. No abnormal extra-axial fluid collections. Basal cisterns are patent. VASCULAR: Mild-to-moderate calcific atherosclerosis of the carotid siphons. SKULL: No skull fracture. No significant scalp soft tissue swelling. SINUSES/ORBITS: The mastoid air-cells and included paranasal sinuses are well-aerated.The included ocular globes and orbital contents are non-suspicious. OTHER: None. IMPRESSION: Normal noncontrast CT head for age. Electronically Signed   By: Elon Alas M.D.   On: 03/08/2018 06:12   US Renal  Result Date: 03/08/2018 CLINICAL DATA:  Elevated creatinine level. EXAM: RENAL / URINARY TRACT ULTRASOUND COMPLETE COMPARISON:  None. FINDINGS: Right Kidney: Length: 12.2 cm. Echogenicity within normal limits. No mass or hydronephrosis visualized. Left Kidney: Length: 12.4 cm. Echogenicity within normal limits. No mass or hydronephrosis visualized. Bladder: Appears normal for degree of bladder distention. IMPRESSION: Normal renal ultrasound. Electronically Signed   By: Marijo Conception, M.D.   On: 03/08/2018 10:47   Dg Chest [facility] 1 View  Result  Date: 03/08/2018 CLINICAL DATA:  Fever and shortness of breath. EXAM: PORTABLE CHEST 1 VIEW COMPARISON:  Radiograph 07/04/2017 FINDINGS: Right chest port with tip in the mid SVC. Left-sided pacemaker with lead overlying the right ventricle. Mild cardiomegaly with unchanged mediastinal contours. No focal airspace disease, pulmonary edema, large pleural effusion or pneumothorax. No acute osseous abnormalities. IMPRESSION: Mild cardiomegaly without acute chest finding. Electronically Signed   By: Jeb Levering M.D.   On: 03/08/2018 01:06    Assessment/Plan:  67 year old male with history significant for CAD, combined systolic and diastolic CHF s/p ICD placement, PVD on Eliquis s/p L BKA, IDDM, HTN, and Mycosis fungoides s/p chemotherapy and radiation now on immunotherapy with last infusion of Mogamulizumab on 5/29 who  is admitted with fever and encephalopathy secondary to enterococcus bacteremia.  Enterococcus Bacteremia: 2 of 2 cultures drawn from right antecubital and right porta cath on 6/4 are positive for enterococcus without resistance to vancomycin. Now on daptomycin instead of vancomycin due to renal dysfunction pending sensitivities. - Continue Daptomycin for now pending sensitivities - Will consider Ampicillin plus Gentamicin if able to narrow - IR consulted for port removal - TEE pending - Repeat blood cultures 6/6 am  AKI: Improving this morning with creatinine 1.96 >> 1.33  Zada Finders, MD Internal Medicine PGY-3 03/09/2018, 9:37 AM

## 2018-03-09 NOTE — Progress Notes (Signed)
EEG completed bedside, full report to follow

## 2018-03-10 ENCOUNTER — Inpatient Hospital Stay (HOSPITAL_COMMUNITY): Payer: Medicare HMO | Admitting: Anesthesiology

## 2018-03-10 ENCOUNTER — Encounter (HOSPITAL_COMMUNITY): Payer: Self-pay

## 2018-03-10 ENCOUNTER — Encounter (HOSPITAL_COMMUNITY)
Admission: EM | Disposition: A | Discharge status: Home / Self Care | Payer: Self-pay | Source: Home / Self Care | Attending: Internal Medicine

## 2018-03-10 ENCOUNTER — Inpatient Hospital Stay (HOSPITAL_COMMUNITY): Payer: Medicare HMO

## 2018-03-10 DIAGNOSIS — I340 Nonrheumatic mitral (valve) insufficiency: Secondary | ICD-10-CM

## 2018-03-10 DIAGNOSIS — R7989 Other specified abnormal findings of blood chemistry: Secondary | ICD-10-CM

## 2018-03-10 DIAGNOSIS — Z452 Encounter for adjustment and management of vascular access device: Secondary | ICD-10-CM

## 2018-03-10 DIAGNOSIS — N289 Disorder of kidney and ureter, unspecified: Secondary | ICD-10-CM

## 2018-03-10 HISTORY — PX: TEE WITHOUT CARDIOVERSION: SHX5443

## 2018-03-10 LAB — BASIC METABOLIC PANEL
ANION GAP: 10 (ref 5–15)
BUN: 14 mg/dL (ref 6–20)
CALCIUM: 9.2 mg/dL (ref 8.9–10.3)
CO2: 26 mmol/L (ref 22–32)
Chloride: 105 mmol/L (ref 101–111)
Creatinine, Ser: 1.16 mg/dL (ref 0.61–1.24)
Glucose, Bld: 137 mg/dL — ABNORMAL HIGH (ref 65–99)
POTASSIUM: 3.8 mmol/L (ref 3.5–5.1)
SODIUM: 141 mmol/L (ref 135–145)

## 2018-03-10 LAB — CBC WITH DIFFERENTIAL/PLATELET
ABS IMMATURE GRANULOCYTES: 0 10*3/uL (ref 0.0–0.1)
BASOS ABS: 0 10*3/uL (ref 0.0–0.1)
BASOS PCT: 1 %
Eosinophils Absolute: 0.1 10*3/uL (ref 0.0–0.7)
Eosinophils Relative: 2 %
HCT: 29.6 % — ABNORMAL LOW (ref 39.0–52.0)
Hemoglobin: 9.5 g/dL — ABNORMAL LOW (ref 13.0–17.0)
Immature Granulocytes: 0 %
LYMPHS PCT: 15 %
Lymphs Abs: 0.6 10*3/uL — ABNORMAL LOW (ref 0.7–4.0)
MCH: 26.6 pg (ref 26.0–34.0)
MCHC: 32.1 g/dL (ref 30.0–36.0)
MCV: 82.9 fL (ref 78.0–100.0)
MONO ABS: 0.5 10*3/uL (ref 0.1–1.0)
Monocytes Relative: 12 %
NEUTROS ABS: 2.8 10*3/uL (ref 1.7–7.7)
Neutrophils Relative %: 70 %
PLATELETS: 188 10*3/uL (ref 150–400)
RBC: 3.57 MIL/uL — AB (ref 4.22–5.81)
RDW: 13.9 % (ref 11.5–15.5)
WBC: 4 10*3/uL (ref 4.0–10.5)

## 2018-03-10 LAB — GLUCOSE, CAPILLARY
GLUCOSE-CAPILLARY: 141 mg/dL — AB (ref 65–99)
Glucose-Capillary: 130 mg/dL — ABNORMAL HIGH (ref 65–99)
Glucose-Capillary: 221 mg/dL — ABNORMAL HIGH (ref 65–99)
Glucose-Capillary: 169 mg/dL — ABNORMAL HIGH (ref 65–99)

## 2018-03-10 LAB — URIC ACID: URIC ACID, SERUM: 6.5 mg/dL (ref 4.4–7.6)

## 2018-03-10 LAB — PHOSPHORUS: PHOSPHORUS: 2.8 mg/dL (ref 2.5–4.6)

## 2018-03-10 LAB — PROTIME-INR
INR: 1.12
Prothrombin Time: 14.3 seconds (ref 11.4–15.2)

## 2018-03-10 SURGERY — ECHOCARDIOGRAM, TRANSESOPHAGEAL
Anesthesia: Monitor Anesthesia Care

## 2018-03-10 MED ORDER — PHENOL 1.4 % MT LIQD
1.0000 | OROMUCOSAL | Status: DC | PRN
Start: 2018-03-10 — End: 2018-03-14
  Administered 2018-03-10: 1 via OROMUCOSAL
  Filled 2018-03-10: qty 177

## 2018-03-10 MED ORDER — RAMELTEON 8 MG PO TABS
8.0000 mg | ORAL_TABLET | Freq: Every day | ORAL | Status: DC
  Administered 2018-03-10 – 2018-03-13 (×4): 8 mg via ORAL
  Filled 2018-03-10 (×5): qty 1

## 2018-03-10 MED ORDER — PROPOFOL 10 MG/ML IV BOLUS
INTRAVENOUS | Status: DC | PRN
  Administered 2018-03-10 (×2): 20 mg via INTRAVENOUS

## 2018-03-10 MED ORDER — ENSURE ENLIVE PO LIQD
237.0000 mL | Freq: Two times a day (BID) | ORAL | Status: DC
  Administered 2018-03-10 – 2018-03-14 (×5): 237 mL via ORAL

## 2018-03-10 MED ORDER — SODIUM CHLORIDE 0.9 % IV SOLN
2.0000 g | INTRAVENOUS | Status: DC
  Administered 2018-03-10 – 2018-03-14 (×23): 2 g via INTRAVENOUS
  Filled 2018-03-10 (×30): qty 2000

## 2018-03-10 MED ORDER — BUTAMBEN-TETRACAINE-BENZOCAINE 2-2-14 % EX AERO
INHALATION_SPRAY | CUTANEOUS | Status: DC | PRN
  Administered 2018-03-10: 1 via TOPICAL

## 2018-03-10 MED ORDER — PROPOFOL 500 MG/50ML IV EMUL
INTRAVENOUS | Status: DC | PRN
  Administered 2018-03-10: 125 ug/kg/min via INTRAVENOUS

## 2018-03-10 MED ORDER — LIDOCAINE HCL (CARDIAC) PF 100 MG/5ML IV SOSY
PREFILLED_SYRINGE | INTRAVENOUS | Status: DC | PRN
  Administered 2018-03-10: 40 mg via INTRATRACHEAL

## 2018-03-10 NOTE — Progress Notes (Signed)
[facility] for Infectious Disease   Reason for visit: Follow up on bacteremia  Interval History: repeat blood cultures sent this am; WBC wnl, no fever, no chills.  Feels well and improved.   TEE without vegetation of valves or ICD.   Physical Exam: Constitutional:  Vitals:   03/10/18 0856 03/10/18 0906  BP: (!) 87/32 (!) 108/55  Pulse: (!) 101 100  Resp: (!) 22 17  Temp: 98.6 F (37 C)   SpO2: 100% 99%   patient appears in NAD Eyes: anicteric HENT: no thrush CHEST; porta cath in place; no surrounding erythema Respiratory: Normal respiratory effort; CTA B Cardiovascular: RRR GI: soft, nt, nd  Review of Systems: Constitutional: negative for fevers and chills Gastrointestinal: negative for nausea and diarrhea Integument/breast: negative for rash  Lab Results  Component Value Date   WBC 4.0 03/10/2018   HGB 9.5 (L) 03/10/2018   HCT 29.6 (L) 03/10/2018   MCV 82.9 03/10/2018   PLT 188 03/10/2018    Lab Results  Component Value Date   CREATININE 1.16 03/10/2018   BUN 14 03/10/2018   NA 141 03/10/2018   K 3.8 03/10/2018   CL 105 03/10/2018   CO2 26 03/10/2018    Lab Results  Component Value Date   ALT 22 03/09/2018   AST 18 03/09/2018   ALKPHOS 104 03/09/2018     Microbiology: Recent Results (from the past 240 hour(s))  Blood Culture (routine x 2)     Status: Abnormal (Preliminary result)   Collection Time: 03/08/18  1:10 AM  Result Value Ref Range Status   Specimen Description BLOOD RIGHT ANTECUBITAL  Final   Special Requests   Final    BOTTLES DRAWN AEROBIC AND ANAEROBIC Blood Culture adequate volume   Culture  Setup Time   Final    GRAM POSITIVE COCCI IN BOTH AEROBIC AND ANAEROBIC BOTTLES CRITICAL VALUE NOTED.  VALUE IS CONSISTENT WITH PREVIOUSLY REPORTED AND CALLED VALUE.    Culture (A)  Final    ENTEROCOCCUS FAECALIS SUSCEPTIBILITIES PERFORMED ON PREVIOUS CULTURE WITHIN THE LAST 5 DAYS. Performed at [facility], [place] [address],  [place], [place] [address]    Report Status PENDING  Incomplete  Blood Culture (routine x 2)     Status: Abnormal (Preliminary result)   Collection Time: 03/08/18  1:27 AM  Result Value Ref Range Status   Specimen Description BLOOD RIGHT PORTA CATH  Final   Special Requests   Final    BOTTLES DRAWN AEROBIC AND ANAEROBIC Blood Culture adequate volume   Culture  Setup Time   Final    GRAM POSITIVE COCCI IN PAIRS IN CHAINS IN BOTH AEROBIC AND ANAEROBIC BOTTLES Organism ID to follow CRITICAL RESULT CALLED TO, READ BACK BY AND VERIFIED WITH: Ferne Coe PharmD 14:30 03/08/18 ([email or web address]) Performed at [facility], [address], [place], [place] [address]    Culture ENTEROCOCCUS FAECALIS (A)  Final   Report Status PENDING  Incomplete   Organism ID, Bacteria ENTEROCOCCUS FAECALIS  Final      Susceptibility   Enterococcus faecalis - MIC*    AMPICILLIN <=2 SENSITIVE Sensitive     VANCOMYCIN 1 SENSITIVE Sensitive     GENTAMICIN SYNERGY SENSITIVE Sensitive     * ENTEROCOCCUS FAECALIS  Blood Culture ID Panel (Reflexed)     Status: Abnormal   Collection Time: 03/08/18  1:27 AM  Result Value Ref Range Status   Enterococcus species DETECTED (A) NOT DETECTED Final    Comment:  CRITICAL RESULT CALLED TO, READ BACK BY AND VERIFIED WITH: Ferne Coe PharmD 14:30 03/08/18 ([email or web address])    Vancomycin resistance NOT DETECTED NOT DETECTED Final   Listeria monocytogenes NOT DETECTED NOT DETECTED Final   Staphylococcus species NOT DETECTED NOT DETECTED Final   Staphylococcus aureus NOT DETECTED NOT DETECTED Final   Streptococcus species NOT DETECTED NOT DETECTED Final   Streptococcus agalactiae NOT DETECTED NOT DETECTED Final   Streptococcus pneumoniae NOT DETECTED NOT DETECTED Final   Streptococcus pyogenes NOT DETECTED NOT DETECTED Final   Acinetobacter baumannii NOT DETECTED NOT DETECTED Final   Enterobacteriaceae species NOT DETECTED NOT DETECTED Final   Enterobacter cloacae complex NOT DETECTED NOT DETECTED  Final   Escherichia coli NOT DETECTED NOT DETECTED Final   Klebsiella oxytoca NOT DETECTED NOT DETECTED Final   Klebsiella pneumoniae NOT DETECTED NOT DETECTED Final   Proteus species NOT DETECTED NOT DETECTED Final   Serratia marcescens NOT DETECTED NOT DETECTED Final   Haemophilus influenzae NOT DETECTED NOT DETECTED Final   Neisseria meningitidis NOT DETECTED NOT DETECTED Final   Pseudomonas aeruginosa NOT DETECTED NOT DETECTED Final   Candida albicans NOT DETECTED NOT DETECTED Final   Candida glabrata NOT DETECTED NOT DETECTED Final   Candida krusei NOT DETECTED NOT DETECTED Final   Candida parapsilosis NOT DETECTED NOT DETECTED Final   Candida tropicalis NOT DETECTED NOT DETECTED Final    Comment: Performed at [facility], [address], [place], [place] [address]  MRSA PCR Screening     Status: None   Collection Time: 03/08/18  9:51 AM  Result Value Ref Range Status   MRSA by PCR NEGATIVE NEGATIVE Final    Comment:        The GeneXpert MRSA Assay (FDA approved for NASAL specimens only), is one component of a comprehensive MRSA colonization surveillance program. It is not intended to diagnose MRSA infection nor to guide or monitor treatment for MRSA infections. Performed at [facility], [place] [address], [place], [place] [address]     Impression/Plan:  1. Probable line infection - repeat blood cultures sent.  Line to come out tomorrow.  I will change to ampicillin alone with no endocarditis.   He should get two weeks of treatment from catheter removal tomorrow through June 20.   2. Renal insufficiency - improved.  Now wnl.   3.  Access - ok for picc line on Monday.  Likely can complete infusions with picc.  I would wait a bit longer to replace port a cath if needed again.  Will defer to [facility] for replacement of port a cath.

## 2018-03-10 NOTE — Transfer of Care (Signed)
Immediate Anesthesia Transfer of Care Note  Patient: Eric Price  Procedure(s) Performed: TRANSESOPHAGEAL ECHOCARDIOGRAM (TEE) (N/A )  Patient Location: Endoscopy Unit  Anesthesia Type:MAC  Level of Consciousness: awake, alert  and oriented  Airway & Oxygen Therapy: Patient Spontanous Breathing and Patient connected to nasal cannula oxygen  Post-op Assessment: Report given to RN, Post -op Vital signs reviewed and stable and Patient moving all extremities X 4  Post vital signs: Reviewed and stable  Last Vitals:  Vitals Value Taken Time  BP    Temp 37 C 03/10/2018  8:56 AM  Pulse    Resp    SpO2      Last Pain:  Vitals:   03/10/18 0808  TempSrc: Oral  PainSc: 0-No pain         Complications: No apparent anesthesia complications

## 2018-03-10 NOTE — Anesthesia Preprocedure Evaluation (Addendum)
Anesthesia Evaluation  Patient identified by MRN, date of birth, ID band Patient awake    Reviewed: Allergy & Precautions, NPO status , Patient's Chart, lab work & pertinent test results  History of Anesthesia Complications (+) PONVNegative for: history of anesthetic complications  Airway Mallampati: I  TM Distance: >3 FB Neck ROM: Full    Dental  (+) Caps, Poor Dentition, Dental Advisory Given   Pulmonary neg pulmonary ROS,    Pulmonary exam normal        Cardiovascular hypertension, + angina + CAD and + Peripheral Vascular Disease  Normal cardiovascular exam  Study Conclusions  - Left ventricle: LVEF is 30 to 35% with diffuse hypokinesis, slightly worse in the infeiror wall No definite thrombus identified. The cavity size was normal. Wall thickness was increased in a pattern of moderate LVH.    Neuro/Psych PSYCHIATRIC DISORDERS Depression negative neurological ROS     GI/Hepatic negative GI ROS, Neg liver ROS,   Endo/Other  diabetesMorbid obesity  Renal/GU negative Renal ROS     Musculoskeletal  (+) Arthritis ,   Abdominal   Peds  Hematology  (+) anemia ,   Anesthesia Other Findings   Reproductive/Obstetrics                            Lab Results  Component Value Date   WBC 4.0 03/10/2018   HGB 9.5 (L) 03/10/2018   HCT 29.6 (L) 03/10/2018   MCV 82.9 03/10/2018   PLT 188 03/10/2018   Lab Results  Component Value Date   CREATININE 1.16 03/10/2018   BUN 14 03/10/2018   NA 141 03/10/2018   K 3.8 03/10/2018   CL 105 03/10/2018   CO2 26 03/10/2018    Anesthesia Physical  Anesthesia Plan  ASA: III  Anesthesia Plan: MAC   Post-op Pain Management:    Induction:   PONV Risk Score and Plan: 2  Airway Management Planned: Nasal Cannula and Natural Airway  Additional Equipment:   Intra-op Plan:   Post-operative Plan:   Informed Consent: I have reviewed the  patients History and Physical, chart, labs and discussed the procedure including the risks, benefits and alternatives for the proposed anesthesia with the patient or authorized representative who has indicated his/her understanding and acceptance.   Dental advisory given  Plan Discussed with: CRNA, Anesthesiologist and Surgeon  Anesthesia Plan Comments:         Anesthesia Quick Evaluation

## 2018-03-10 NOTE — Progress Notes (Signed)
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  RD spoke with patient at bedside. Wife present. He reports his good. He ate >50% of his breakfast this AM. Wife states he was consuming "child portions" of his meals PTA.  Reveals his PO intake was minimal. He developed "gunk" in his mouth from chemotherapy. He was drinking 1 Boost High Protein supplement per day. Wife says he was drinking 2-3 at one point. Denies recent unintentional weight loss. He reports it fluctuates. On Lasix.  Pt would like to try Ensure Enlive during hospitalization.  Labs and medications reviewed. CBG's [id number].  NUTRITION - FOCUSED PHYSICAL EXAM:  Completed. No muslce or fat depletions noticed.  Diet Order:   Diet Order           Diet heart healthy/carb modified Room service appropriate? Yes; Fluid consistency: Thin  Diet effective now         EDUCATION NEEDS:   No education needs have been identified at this time  Skin:  Skin Assessment: Reviewed RN Assessment  Last BM:  N/A  Height:   Ht Readings from Last 1 Encounters:  03/10/18 5\' 9"  (1.753 m)   Weight:   Wt Readings from Last 1 Encounters:  03/10/18 186 lb (84.4 kg)   BMI:  29.1 kg/m2 (adjusted for BKA)  Estimated  Nutritional Needs:   Kcal:  2000-2200  Protein:  100-110 gm  Fluid:  2.0-2.2 L  Arthur Holms, RD, LDN Pager #: [phone] After-Hours Pager #: [phone]

## 2018-03-10 NOTE — Care Management Note (Signed)
Case Management Note  Patient Details  Name: Eric Price MRN: [id number] Date of Birth: 1951/03/14  Subjective/Objective:    From home with spouse, presents with SIRS, skin cancer, port a cath scheduled to come out on 6/7 ? Source of infection.  Has hx of L BKA, has prosthetic, was on eliquis pta. TEE neg for endo. conts on iv abx.                Action/Plan: DC home when ready.   Expected Discharge Date:                  Expected Discharge Plan:  Home/Self Care  In-House Referral:     Discharge planning Services  CM Consult  Post Acute Care Choice:    Choice offered to:     DME Arranged:    DME Agency:     HH Arranged:    HH Agency:     Status of Service:  In process, will continue to follow  If discussed at Long Length of Stay Meetings, dates discussed:    Additional Comments:  Zenon Mayo, RN 03/10/2018, 12:36 PM

## 2018-03-10 NOTE — CV Procedure (Signed)
    PROCEDURE NOTE:  Procedure:  Transesophageal echocardiogram Operator:  Fransico Him, MD Indications:  bactermia Complications: None  During this procedure the patient is administered a total of  Propofol 125 mg to achieve and maintain moderate conscious sedation.  The patient's heart rate, blood pressure, and oxygen saturation are monitored continuously during the procedure by anesthesia.   Results: Normal LV size with mildly reduced LVF with EF 40-45% Normal RV size and function with pacer wire noted int he RV with no obvious vegetation Normal RA with pacer wire noted in RA with no obvious vegetation Normal LA with normal LA appendage Normal TV  Normal PV  Normal MV with mild MR Normal interatrial septum by colorflow doppler Normal thoracic and ascending aorta.  The patient tolerated the procedure well and was transferred back to their room in stable condition.  Signed: Fransico Him, MD [facility]

## 2018-03-10 NOTE — Anesthesia Postprocedure Evaluation (Signed)
Anesthesia Post Note  Patient: Eric Price  Procedure(s) Performed: TRANSESOPHAGEAL ECHOCARDIOGRAM (TEE) (N/A )     Patient location during evaluation: PACU Anesthesia Type: MAC Level of consciousness: awake and alert Pain management: pain level controlled Vital Signs Assessment: post-procedure vital signs reviewed and stable Respiratory status: spontaneous breathing, nonlabored ventilation, respiratory function stable and patient connected to nasal cannula oxygen Cardiovascular status: stable and blood pressure returned to baseline Postop Assessment: no apparent nausea or vomiting Anesthetic complications: no    Last Vitals:  Vitals:   03/10/18 0856 03/10/18 0906  BP: (!) 87/32 (!) 108/55  Pulse: (!) 101 100  Resp: (!) 22 17  Temp: 37 C   SpO2: 100% 99%    Last Pain:  Vitals:   03/10/18 0906  TempSrc:   PainSc: 0-No pain                 Jerauld Bostwick

## 2018-03-10 NOTE — Consult Note (Signed)
[facility] CM Primary Care Navigator  03/10/2018  Eric Price 1951-07-06 [id number]   Met with patientand wife [name]) at the bedside to identify possible discharge needs. Wife reports that patient had "stroke-like symptoms like: incoherent and babbling speech; unable to answer questions" that had led to this admission. (working diagnosis of systemic inflammatoryresponsesyndrometo rule outsepsis,complicated bymetabolicencephalopathy andacutekidneyinjury)  Patient endorsesDr.Bruce Product manager at [employer] as [place] care provider.   [facility] [place] [address] and [employer] [facility] to obtain medications withoutdifficulty.   Patient's wife has been managinghismedications at home straight out of the containers.  Per wife, she hasbeen driving andprovidingtransportation tohisdoctors'appointments.  Patient's wife is his primary caregiver at home.  Anticipated discharge plan ishomeper wife.  Patient and wife expressed understanding to call primary care provider's officewhen patient gets homefor a post discharge follow-up appointment within1- 2 weeksor sooner if needs arise.Patient letter (with PCP's contact number) wasprovided as a reminder.  Patient has history of coronary artery disease, hypertension, type 2 diabetes mellitus, diastolic heart failure and 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  Patient and wife mentioned the need for further education, information and guidance on patient's diet (food to eat and to avoid) and need to reinforce  and monitor adherence with diet restrictions. Patient has been monitoring/ recording blood sugars and weight (has weighing scale) but has to be done regularly as stated.   Explained to patientand [facility]about [facility] care management services and resources available forfurther health management at home  andbothvoiced interest about it. Patient expressedwillingness to havetelephone calls to  provide support for chronic disease management at home afterdischarge.   Patientverbally agreed and opted for referralto [facility] for further disease education and management of chronic conditions.   Referral made to [place] forfollow-upof needsafter discharge,provide information/health educationand reinforce disease management of chronic conditions(HF/ DM)at home.  THNcare managementinformation provided for future needs that may arise   For additional questions please contact:  Dalani Mette A. Blayde Bacigalupi, BSN, RN-BC [facility] PRIMARY CARE Navigator Cell: [phone]

## 2018-03-10 NOTE — Anesthesia Procedure Notes (Signed)
Procedure Name: MAC Date/Time: 03/10/2018 8:34 AM Performed by: Mariea Clonts, CRNA Pre-anesthesia Checklist: Patient identified, Emergency Drugs available, Suction available, Patient being monitored and Timeout performed Patient Re-evaluated:Patient Re-evaluated prior to induction Oxygen Delivery Method: Nasal cannula and Simple face mask

## 2018-03-10 NOTE — Progress Notes (Signed)
  Echocardiogram Echocardiogram Transesophageal has been performed.  Eric Price 03/10/2018, 9:00 AM

## 2018-03-10 NOTE — Progress Notes (Signed)
PROGRESS NOTE    Eric Price  BMW:[id number] DOB: June 10, 1951 DOA: 03/07/2018 PCP: [name], MD    Brief Narrative:  67 year old male who presented with fever, confusion and cough.  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  Reported fevers for last few days prior to hospitalization, associated with generalized weakness, and malaise.  On the initial physical examination his temperature was 38.8 Celsius, heart rate 117 222, blood pressure 145/86, respiratory 28, oxygen saturation 94%.  Moist mucous membranes, lungs clear to auscultation bilaterally, heart S1-S2 present, tachycardic, abdomen soft nontender, lower extremities with no edema, left BKA.  Sodium 134, potassium 4.0, chloride 94, bicarb 28, glucose 80, BUN 27, creatinine 1.96, venous lactic acid 1.7, white count 9.1, hemoglobin 12.4, hematocrit 38.4, platelets 249, arterial blood gas 7.39/ 42.8/75.1/25.3/94%.  Urinalysis negative for infection.  Drug screen negative.  Head CT negative for acute changes.  Chest x-ray, hypoinflated, no infiltrates, right internal jugular vein catheter, left-sided pacemaker.  EKG, sinus rhythm, left axis, normal intervals.  Patient was admitted to the hospital with the working diagnosis of systemic inflammatory response syndrome to rule out sepsis, complicated by metabolic encephalopathy and acute kidney injury.  Blood cultures from 6/4 resulted positive for enterococcus.    Assessment & Plan:   Principal Problem:   SIRS (systemic inflammatory response syndrome) (HCC) Active Problems:   Uncontrolled type 2 diabetes mellitus with peripheral neuropathy (HCC)   Essential hypertension   Mycosis fungoides (HCC)   NICM (nonischemic cardiomyopathy) (HCC)   CAD (coronary artery disease)   Chronic combined systolic and diastolic  CHF (congestive heart failure) ([place])   Renal insufficiency   Acute encephalopathy   1. Enterococcus bacteremia rule out port infection. Patient has remained afebrile, tolerating antibiotic therapy well with Daptomycin, TEE with no vegetation. Plan to remove port after 2 days, off anticoagulation. Follow ID recommendations for further antibiotic therapy.   2. Pre -renal AKI. Patient tolerating po well, off IV fluids, renal function with cr trending down, today at 1.116 with K at 3,8 and serum bicarbonate at 26. Will follow on renal function in am, continue to avoid nephrotoxic medications or hypotension.  3. Metabolic encephalopathy. Has resolved, physical therapy evaluation. Out of bed as tolerated.   4. Diastolic heart failure. Continue to be stable with no signs of decompensation. Continue carvedilol.   5. Mycosis fungoides. On chemotherapy as outpatient. May need to have catheter replaced, after bacteremia clears.   6. T2DM. Glucose cover and monitoring with insulin sliding scale, capillary glucose 112, 127, 193, 130, 221. Continue a lower dose of basal insulin. 6 units of insulin novalol pre-meal.   7. PVD. Continue to hold on apixaban, for catheter removal.   DVT prophylaxis: enoxaparin   Code Status:  full Family Communication: no family at the bedside  Disposition Plan:  Home    Consultants:   ID  Procedures:     Antimicrobials:   Daptomycin     Subjective: Patient is feeling better, no nausea or vomiting, no chest pain. Tolerated well TEE.   Objective: Vitals:   03/09/18 1934 03/09/18 2316 03/10/18 0340 03/10/18 0723  BP: 113/72 114/74 113/78 117/81  Pulse: 94 95 97 96  Resp: 20 13 18  (!) 22  Temp: 98.7 F (37.1 C) 98.7 F (37.1 C) 98.1 F (36.7 C) 98.4 F (36.9 C)  TempSrc: Oral Oral Oral Oral  SpO2:  96% 97% 100% 98%  Weight:   84.7 kg (186 lb 11.7 oz)   Height:        Intake/Output Summary (Last 24 hours) at 03/10/2018 0755 Last data  filed at 03/10/2018 0500 Gross per 24 hour  Intake 592.33 ml  Output 2750 ml  Net -2157.67 ml   Filed Weights   03/08/18 0120 03/09/18 0500 03/10/18 0340  Weight: 86.6 kg (191 lb) 98.4 kg (216 lb 14.9 oz) 84.7 kg (186 lb 11.7 oz)    Examination:   General: Not in pain or dyspnea.  Neurology: Awake and alert, non focal  E ENT: no pallor, no icterus, oral mucosa moist Cardiovascular: No JVD. S1-S2 present, rhythmic, no gallops, rubs, or murmurs. No lower extremity edema. Pulmonary: vesicular breath sounds bilaterally, adequate air movement, no wheezing, rhonchi or rales. Gastrointestinal. Abdomen with no organomegaly, non tender, no rebound or guarding Skin. No rashes Musculoskeletal: no joint deformities     Data Reviewed: I have personally reviewed following labs and imaging studies  CBC: Recent Labs  Lab 03/08/18 0115 03/08/18 0349 03/09/18 0437 03/10/18 0550  WBC 9.1 9.0 5.5 4.0  NEUTROABS 8.0*  --   --  2.8  HGB 12.4* 10.9* 9.8* 9.5*  HCT 38.4* 33.5* 30.2* 29.6*  MCV 81.5 82.5 81.6 82.9  PLT 249 214 184 831   Basic Metabolic Panel: Recent Labs  Lab 03/08/18 0115 03/08/18 0349 03/09/18 0437 03/10/18 0550  NA 134* 134* 138 141  K 4.0 4.0 3.9 3.8  CL 94* 95* 105 105  CO2 28 26 28 26   GLUCOSE 83 517* 101* 137*  BUN 27* 27* 19 14  CREATININE 1.96* 1.96* 1.33* 1.16  CALCIUM 9.8 9.2 8.8* 9.2  PHOS  --   --  3.7 2.8   GFR: Estimated Creatinine Clearance: 61.8 mL/min (by C-G formula based on SCr of 1.16 mg/dL). Liver Function Tests: Recent Labs  Lab 03/08/18 0115 03/09/18 0437  AST 14* 18  ALT 15* 22  ALKPHOS 144* 104  BILITOT 0.6 0.5  PROT 7.5 6.2*  ALBUMIN 4.2 3.2*   No results for input(s): LIPASE, AMYLASE in the last 168 hours. No results for input(s): AMMONIA in the last 168 hours. Coagulation Profile: Recent Labs  Lab 03/09/18 0940 03/10/18 0550  INR 1.29 1.12   Cardiac Enzymes: Recent Labs  Lab 03/09/18 0437  CKTOTAL 49   BNP (last  3 results) No results for input(s): PROBNP in the last 8760 hours. HbA1C: Recent Labs    03/09/18 0437  HGBA1C 6.3*   CBG: Recent Labs  Lab 03/09/18 0741 03/09/18 1210 03/09/18 1645 03/09/18 2111 03/10/18 0721  GLUCAP 105* 112* 127* 193* 130*   Lipid Profile: No results for input(s): CHOL, HDL, LDLCALC, TRIG, CHOLHDL, LDLDIRECT in the last 72 hours. Thyroid Function Tests: No results for input(s): TSH, T4TOTAL, FREET4, T3FREE, THYROIDAB in the last 72 hours. Anemia Panel: No results for input(s): VITAMINB12, FOLATE, FERRITIN, TIBC, IRON, RETICCTPCT in the last 72 hours.    Radiology Studies: I have reviewed all of the imaging during this hospital visit personally     Scheduled Meds: . atorvastatin  20 mg Oral q1800  . buPROPion  150 mg Oral Daily  . carvedilol  25 mg Oral BID WC  . clobetasol cream   Topical BID  . folic acid  1 mg Oral Daily  . insulin aspart  0-15 Units Subcutaneous TID WC  . insulin aspart  0-5 Units Subcutaneous QHS  . insulin aspart  6  Units Subcutaneous TID WC  . insulin glargine  15 Units Subcutaneous QHS  . pantoprazole  40 mg Oral Daily  . pregabalin  75 mg Oral BID  . sodium chloride flush  3 mL Intravenous Q12H   Continuous Infusions: . sodium chloride 20 mL/hr at 03/09/18 1705  . DAPTOmycin (CUBICIN)  IV Stopped (03/09/18 1910)     LOS: 2 days        Jina Olenick Gerome Apley, MD [facility] Pager [phone]

## 2018-03-11 ENCOUNTER — Inpatient Hospital Stay (HOSPITAL_COMMUNITY): Payer: Medicare HMO

## 2018-03-11 ENCOUNTER — Encounter (HOSPITAL_COMMUNITY): Payer: Self-pay | Admitting: Diagnostic Radiology

## 2018-03-11 DIAGNOSIS — D72819 Decreased white blood cell count, unspecified: Secondary | ICD-10-CM

## 2018-03-11 DIAGNOSIS — G9340 Encephalopathy, unspecified: Secondary | ICD-10-CM

## 2018-03-11 HISTORY — PX: IR REMOVAL TUN ACCESS W/ PORT W/O FL MOD SED: IMG2290

## 2018-03-11 LAB — CBC WITH DIFFERENTIAL/PLATELET
Abs Immature Granulocytes: 0 10*3/uL (ref 0.0–0.1)
BASOS ABS: 0 10*3/uL (ref 0.0–0.1)
Basophils Relative: 1 %
EOS PCT: 2 %
Eosinophils Absolute: 0.1 10*3/uL (ref 0.0–0.7)
HEMATOCRIT: 28.4 % — AB (ref 39.0–52.0)
Hemoglobin: 9.1 g/dL — ABNORMAL LOW (ref 13.0–17.0)
IMMATURE GRANULOCYTES: 1 %
LYMPHS ABS: 0.7 10*3/uL (ref 0.7–4.0)
Lymphocytes Relative: 19 %
MCH: 26.7 pg (ref 26.0–34.0)
MCHC: 32 g/dL (ref 30.0–36.0)
MCV: 83.3 fL (ref 78.0–100.0)
Monocytes Absolute: 0.4 10*3/uL (ref 0.1–1.0)
Monocytes Relative: 12 %
NEUTROS PCT: 65 %
Neutro Abs: 2.3 10*3/uL (ref 1.7–7.7)
Platelets: 206 10*3/uL (ref 150–400)
RBC: 3.41 MIL/uL — AB (ref 4.22–5.81)
RDW: 13.7 % (ref 11.5–15.5)
WBC: 3.5 10*3/uL — AB (ref 4.0–10.5)

## 2018-03-11 LAB — BASIC METABOLIC PANEL
Anion gap: 8 (ref 5–15)
BUN: 11 mg/dL (ref 6–20)
CALCIUM: 9 mg/dL (ref 8.9–10.3)
CO2: 25 mmol/L (ref 22–32)
CREATININE: 1 mg/dL (ref 0.61–1.24)
Chloride: 107 mmol/L (ref 101–111)
Glucose, Bld: 137 mg/dL — ABNORMAL HIGH (ref 65–99)
Potassium: 3.9 mmol/L (ref 3.5–5.1)
SODIUM: 140 mmol/L (ref 135–145)

## 2018-03-11 LAB — CULTURE, BLOOD (ROUTINE X 2)
SPECIAL REQUESTS: ADEQUATE
Special Requests: ADEQUATE

## 2018-03-11 LAB — GLUCOSE, CAPILLARY
GLUCOSE-CAPILLARY: 137 mg/dL — AB (ref 65–99)
GLUCOSE-CAPILLARY: 153 mg/dL — AB (ref 65–99)
GLUCOSE-CAPILLARY: 141 mg/dL — AB (ref 65–99)

## 2018-03-11 LAB — URIC ACID: Uric Acid, Serum: 4.5 mg/dL (ref 4.4–7.6)

## 2018-03-11 LAB — PHOSPHORUS: PHOSPHORUS: 2.5 mg/dL (ref 2.5–4.6)

## 2018-03-11 MED ORDER — FENTANYL CITRATE (PF) 100 MCG/2ML IJ SOLN
INTRAMUSCULAR | Status: AC
  Filled 2018-03-11: qty 4

## 2018-03-11 MED ORDER — MIDAZOLAM HCL 2 MG/2ML IJ SOLN
INTRAMUSCULAR | Status: AC | PRN
  Administered 2018-03-11: 1 mg via INTRAVENOUS

## 2018-03-11 MED ORDER — LIDOCAINE HCL 1 % IJ SOLN
INTRAMUSCULAR | Status: AC
  Filled 2018-03-11: qty 20

## 2018-03-11 MED ORDER — APIXABAN 5 MG PO TABS
5.0000 mg | ORAL_TABLET | Freq: Two times a day (BID) | ORAL | Status: DC
  Administered 2018-03-12 – 2018-03-14 (×5): 5 mg via ORAL
  Filled 2018-03-11 (×5): qty 1

## 2018-03-11 MED ORDER — FENTANYL CITRATE (PF) 100 MCG/2ML IJ SOLN
INTRAMUSCULAR | Status: AC | PRN
  Administered 2018-03-11: 50 ug via INTRAVENOUS

## 2018-03-11 MED ORDER — MIDAZOLAM HCL 2 MG/2ML IJ SOLN
INTRAMUSCULAR | Status: AC
  Filled 2018-03-11: qty 4

## 2018-03-11 NOTE — Progress Notes (Signed)
PROGRESS NOTE    Eric Price  AST:[id number] DOB: [birth year] DOA: 03/07/2018 PCP: [name], MD    Brief Narrative:  67 year old male who presented with fever, confusion and cough. 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 Reported fevers for last few days prior to hospitalization, associated with generalized weakness, and malaise. On the initial physical examination his temperature was 38.8 Celsius, heart rate 117 222, blood pressure 145/86, respiratory 28, oxygen saturation 94%. Moist mucous membranes, lungs clear to auscultation bilaterally, heart S1-S2 present, tachycardic, abdomen soft nontender, lower extremities with no edema, left BKA. Sodium 134, potassium 4.0, chloride 94, bicarb 28, glucose 80, BUN 27, creatinine 1.96, venous lactic acid 1.7, white count 9.1, hemoglobin 12.4, hematocrit 38.4, platelets 249,arterial blood gas 7.39/42.8/75.1/25.3/94%.Urinalysis negative for infection. Drug screen negative. Head CT negative for acute changes. Chest x-ray, hypoinflated, no infiltrates, right internal jugular vein catheter,left-sided pacemaker. EKG, sinus rhythm, left axis,normal intervals.  Patient was admitted to the hospital withtheworking diagnosis of systemic inflammatoryresponsesyndrometo rule outsepsis,complicated bymetabolicencephalopathy andacutekidneyinjury.  Blood cultures from 6/4 resulted positive for enterococcus.     Assessment & Plan:   Principal Problem:   SIRS (systemic inflammatory response syndrome) (HCC) Active Problems:   Uncontrolled type 2 diabetes mellitus with peripheral neuropathy (HCC)   Essential hypertension   Mycosis fungoides (HCC)   NICM (nonischemic cardiomyopathy) (HCC)   CAD (coronary artery disease)   Chronic combined systolic and  diastolic CHF (congestive heart failure) ([name])   Renal insufficiency   Acute encephalopathy   1. Enterococcus bacteremia rule out port infection. Antibiotic theraly changed to ampicillin that he will continue for the next 2 weeks, until June 20. No central line for the next 48 hours, will plan for picc line on Monday. Follow up at [facility] [place].   2. Pre -renal AKI. Renal function with serum cr down to 1,0, k at 3,9 and serum bicarbonate at 25. Will hold on further IV fluids, patient is tolerating po well. No nausea or vomiting.   3. Metabolic encephalopathy. Resolved. Physical therapy evaluation and out of bed as tolerated.   4. Diastolic heart failure. Continue carvedilol.No clinical signs of exacerbation.   5. Mycosis fungoides. Follow with [facility] as outpatient, may need port to be replaced.   6. T2DM. Continue glucose cover and monitoring with insulin sliding scale, capillary glucose 130, 221, 141, 169, 137. Basal insulin with 25 units plus 6 units pre-meal.   7. PVD. Will resume apixaban in am. Out of bed as tolerated. Continue atorvastatin.    DVT prophylaxis:enoxaparin Code Status:full Family Communication:no family at the bedside Disposition Plan:Home   Consultants:  ID  Procedures:    Antimicrobials:  Ampicillin   Subjective: Patient is feeling well, no nausea or vomiting, no chest pain or dyspnea.   Objective: Vitals:   03/10/18 1538 03/10/18 2349 03/11/18 0444 03/11/18 0808  BP: 123/86 131/82  133/73  Pulse: 99 97  88  Resp: 16 16  18   Temp: 98.3 F (36.8 C) 98.8 F (37.1 C)  98.5 F (36.9 C)  TempSrc: Oral Oral  Oral  SpO2: 98% 98%  96%  Weight:   86 kg (189 lb 9.5 oz)   Height:        Intake/Output Summary (Last 24 hours) at 03/11/2018 0820 Last data filed at 03/11/2018 0534 Gross per 24 hour  Intake 640 ml  Output 2126 ml  Net -1486 ml   Filed Weights   03/10/18 0340 03/10/18 0808 03/11/18 0444  Weight: 84.7 kg (186  lb 11.7 oz) 84.4 kg (186 lb) 86 kg (189 lb 9.5 oz)    Examination:   General: Not in pain or dyspnea Neurology: Awake and alert, non focal  E ENT: no pallor, no icterus, oral mucosa moist Cardiovascular: No JVD. S1-S2 present, rhythmic, no gallops, rubs, or murmurs. No lower extremity edema. Pulmonary: vesicular breath sounds bilaterally, adequate air movement, no wheezing, rhonchi or rales. Gastrointestinal. Abdomen no organomegaly, non tender, no rebound or guarding Skin. No rashes Musculoskeletal: no joint deformities     Data Reviewed: I have personally reviewed following labs and imaging studies  CBC: Recent Labs  Lab 03/08/18 0115 03/08/18 0349 03/09/18 0437 03/10/18 0550 03/11/18 0323  WBC 9.1 9.0 5.5 4.0 3.5*  NEUTROABS 8.0*  --   --  2.8 2.3  HGB 12.4* 10.9* 9.8* 9.5* 9.1*  HCT 38.4* 33.5* 30.2* 29.6* 28.4*  MCV 81.5 82.5 81.6 82.9 83.3  PLT 249 214 184 188 295   Basic Metabolic Panel: Recent Labs  Lab 03/08/18 0115 03/08/18 0349 03/09/18 0437 03/10/18 0550 03/11/18 0323  NA 134* 134* 138 141 140  K 4.0 4.0 3.9 3.8 3.9  CL 94* 95* 105 105 107  CO2 28 26 28 26 25   GLUCOSE 83 104* 101* 137* 137*  BUN 27* 27* 19 14 11   CREATININE 1.96* 1.96* 1.33* 1.16 1.00  CALCIUM 9.8 9.2 8.8* 9.2 9.0  PHOS  --   --  3.7 2.8 2.5   GFR: Estimated Creatinine Clearance: 77.9 mL/min (by C-G formula based on SCr of 1 mg/dL). Liver Function Tests: Recent Labs  Lab 03/08/18 0115 03/09/18 0437  AST 14* 18  ALT 15* 22  ALKPHOS 144* 104  BILITOT 0.6 0.5  PROT 7.5 6.2*  ALBUMIN 4.2 3.2*   No results for input(s): LIPASE, AMYLASE in the last 168 hours. No results for input(s): AMMONIA in the last 168 hours. Coagulation Profile: Recent Labs  Lab 03/09/18 0940 03/10/18 0550  INR 1.29 1.12   Cardiac Enzymes: Recent Labs  Lab 03/09/18 0437  CKTOTAL 49   BNP (last 3 results) No results for input(s): PROBNP in the last 8760 hours. HbA1C: Recent Labs     03/09/18 0437  HGBA1C 6.3*   CBG: Recent Labs  Lab 03/10/18 0721 03/10/18 1215 03/10/18 1703 03/10/18 2133 03/11/18 0734  GLUCAP 130* 221* 141* 169* 137*   Lipid Profile: No results for input(s): CHOL, HDL, LDLCALC, TRIG, CHOLHDL, LDLDIRECT in the last 72 hours. Thyroid Function Tests: No results for input(s): TSH, T4TOTAL, FREET4, T3FREE, THYROIDAB in the last 72 hours. Anemia Panel: No results for input(s): VITAMINB12, FOLATE, FERRITIN, TIBC, IRON, RETICCTPCT in the last 72 hours.    Radiology Studies: I have reviewed all of the imaging during this hospital visit personally     Scheduled Meds: . atorvastatin  20 mg Oral q1800  . buPROPion  150 mg Oral Daily  . carvedilol  25 mg Oral BID WC  . clobetasol cream   Topical BID  . feeding supplement (ENSURE ENLIVE)  237 mL Oral BID BM  . folic acid  1 mg Oral Daily  . insulin aspart  0-15 Units Subcutaneous TID WC  . insulin aspart  0-5 Units Subcutaneous QHS  . insulin aspart  6 Units Subcutaneous TID WC  . insulin glargine  15 Units Subcutaneous QHS  . pantoprazole  40 mg Oral Daily  .  pregabalin  75 mg Oral BID  . ramelteon  8 mg Oral QHS  . sodium chloride flush  3 mL Intravenous Q12H   Continuous Infusions: . ampicillin (OMNIPEN) IV 2 g (03/11/18 7340)     LOS: 3 days        Mauricio Gerome Apley, MD [facility] Pager [phone]

## 2018-03-11 NOTE — Care Management Important Message (Signed)
Important Message  Patient Details  Name: Eric Price MRN: [id number] Date of Birth: [birth year]   Medicare Important Message Given:  Yes    Orbie Pyo 03/11/2018, 3:28 PM

## 2018-03-11 NOTE — Procedures (Signed)
The right chest port was successfully removed.  The port pocket was clean and no signs of infection within the subcutaneous tissue.  The pocket was closed and Dermabond placed over incision.  Minimal blood loss and no immediate complication.

## 2018-03-11 NOTE — Progress Notes (Signed)
Patient was off unit for removal of port.  Arrived back to unit shortly after 1400 and dressing to right upper chest is clean, dry, and intact with a 4x4 gauze and tegaderm.  Patient denies pain and wife to order tray.

## 2018-03-11 NOTE — Discharge Instructions (Signed)

## 2018-03-11 NOTE — Progress Notes (Signed)
[facility] for Infectious Disease   Reason for visit: Follow up on bacteremia  Interval History: repeat blood cultures ngtd; WBC with some leukopenia, no fever, no chills.  Feels well and improved.    Physical Exam: Constitutional:  Vitals:   03/11/18 1330 03/11/18 1335  BP: 131/78 133/79  Pulse: 89 88  Resp: 18 15  Temp:    SpO2: 100% 100%   patient appears in NAD Eyes: anicteric HENT: no thrush CHEST; porta cath in place; no surrounding erythema Respiratory: Normal respiratory effort; CTA B Cardiovascular: RRR  Review of Systems: Constitutional: negative for fevers and chills Integument/breast: negative for rash  Lab Results  Component Value Date   WBC 3.5 (L) 03/11/2018   HGB 9.1 (L) 03/11/2018   HCT 28.4 (L) 03/11/2018   MCV 83.3 03/11/2018   PLT 206 03/11/2018    Lab Results  Component Value Date   CREATININE 1.00 03/11/2018   BUN 11 03/11/2018   NA 140 03/11/2018   K 3.9 03/11/2018   CL 107 03/11/2018   CO2 25 03/11/2018    Lab Results  Component Value Date   ALT 22 03/09/2018   AST 18 03/09/2018   ALKPHOS 104 03/09/2018     Microbiology: Recent Results (from the past 240 hour(s))  Blood Culture (routine x 2)     Status: Abnormal   Collection Time: 03/08/18  1:10 AM  Result Value Ref Range Status   Specimen Description BLOOD RIGHT ANTECUBITAL  Final   Special Requests   Final    BOTTLES DRAWN AEROBIC AND ANAEROBIC Blood Culture adequate volume   Culture  Setup Time   Final    GRAM POSITIVE COCCI IN BOTH AEROBIC AND ANAEROBIC BOTTLES CRITICAL VALUE NOTED.  VALUE IS CONSISTENT WITH PREVIOUSLY REPORTED AND CALLED VALUE.    Culture (A)  Final    ENTEROCOCCUS FAECALIS SUSCEPTIBILITIES PERFORMED ON PREVIOUS CULTURE WITHIN THE LAST 5 DAYS. Performed at [facility], [place] [address], [place], [place] [address]    Report Status 03/11/2018 FINAL  Final  Blood Culture (routine x 2)     Status: Abnormal   Collection Time: 03/08/18  1:27  AM  Result Value Ref Range Status   Specimen Description BLOOD RIGHT PORTA CATH  Final   Special Requests   Final    BOTTLES DRAWN AEROBIC AND ANAEROBIC Blood Culture adequate volume   Culture  Setup Time   Final    GRAM POSITIVE COCCI IN PAIRS IN CHAINS IN BOTH AEROBIC AND ANAEROBIC BOTTLES Organism ID to follow CRITICAL RESULT CALLED TO, READ BACK BY AND VERIFIED WITH: Ferne Coe PharmD 14:30 03/08/18 ([email or web address]) Performed at [facility], [place] [address] [place], [place] [address]    Culture ENTEROCOCCUS FAECALIS (A)  Final   Report Status 03/11/2018 FINAL  Final   Organism ID, Bacteria ENTEROCOCCUS FAECALIS  Final      Susceptibility   Enterococcus faecalis - MIC*    AMPICILLIN <=2 SENSITIVE Sensitive     VANCOMYCIN 1 SENSITIVE Sensitive     GENTAMICIN SYNERGY SENSITIVE Sensitive     * ENTEROCOCCUS FAECALIS  Blood Culture ID Panel (Reflexed)     Status: Abnormal   Collection Time: 03/08/18  1:27 AM  Result Value Ref Range Status   Enterococcus species DETECTED (A) NOT DETECTED Final    Comment: CRITICAL RESULT CALLED TO, READ BACK BY AND VERIFIED WITH: Ferne Coe PharmD 14:30 03/08/18 ([email or web address])    Vancomycin resistance NOT DETECTED NOT DETECTED Final  Listeria monocytogenes NOT DETECTED NOT DETECTED Final   Staphylococcus species NOT DETECTED NOT DETECTED Final   Staphylococcus aureus NOT DETECTED NOT DETECTED Final   Streptococcus species NOT DETECTED NOT DETECTED Final   Streptococcus agalactiae NOT DETECTED NOT DETECTED Final   Streptococcus pneumoniae NOT DETECTED NOT DETECTED Final   Streptococcus pyogenes NOT DETECTED NOT DETECTED Final   Acinetobacter baumannii NOT DETECTED NOT DETECTED Final   Enterobacteriaceae species NOT DETECTED NOT DETECTED Final   Enterobacter cloacae complex NOT DETECTED NOT DETECTED Final   Escherichia coli NOT DETECTED NOT DETECTED Final   Klebsiella oxytoca NOT DETECTED NOT DETECTED Final   Klebsiella pneumoniae NOT DETECTED NOT DETECTED  Final   Proteus species NOT DETECTED NOT DETECTED Final   Serratia marcescens NOT DETECTED NOT DETECTED Final   Haemophilus influenzae NOT DETECTED NOT DETECTED Final   Neisseria meningitidis NOT DETECTED NOT DETECTED Final   Pseudomonas aeruginosa NOT DETECTED NOT DETECTED Final   Candida albicans NOT DETECTED NOT DETECTED Final   Candida glabrata NOT DETECTED NOT DETECTED Final   Candida krusei NOT DETECTED NOT DETECTED Final   Candida parapsilosis NOT DETECTED NOT DETECTED Final   Candida tropicalis NOT DETECTED NOT DETECTED Final    Comment: Performed at [facility], [place] [address] [place], [place] [address]  MRSA PCR Screening     Status: None   Collection Time: 03/08/18  9:51 AM  Result Value Ref Range Status   MRSA by PCR NEGATIVE NEGATIVE Final    Comment:        The GeneXpert MRSA Assay (FDA approved for NASAL specimens only), is one component of a comprehensive MRSA colonization surveillance program. It is not intended to diagnose MRSA infection nor to guide or monitor treatment for MRSA infections. Performed at [facility], [place] [address], [place], [place] [address]     Impression/Plan:  1. Probable line infection - repeat blood cultures sent.  Line out today.  Continue on ampicillin.   He should get two weeks of treatment from catheter removal tomorrow through June 20.   2. Renal insufficiency - creat remains wnl  3.  Access - picc line ok on Sunday if culture remain negative.   OK from ID standpoint to discharge after that Dr. Johnnye Sima is available over the weekend if needed, otherwise I will follow up on Monday.

## 2018-03-12 ENCOUNTER — Inpatient Hospital Stay: Payer: Self-pay

## 2018-03-12 DIAGNOSIS — I2510 Atherosclerotic heart disease of native coronary artery without angina pectoris: Secondary | ICD-10-CM

## 2018-03-12 DIAGNOSIS — R410 Disorientation, unspecified: Secondary | ICD-10-CM

## 2018-03-12 LAB — GLUCOSE, CAPILLARY
GLUCOSE-CAPILLARY: 169 mg/dL — AB (ref 65–99)
GLUCOSE-CAPILLARY: 149 mg/dL — AB (ref 65–99)
Glucose-Capillary: 130 mg/dL — ABNORMAL HIGH (ref 65–99)
Glucose-Capillary: 147 mg/dL — ABNORMAL HIGH (ref 65–99)

## 2018-03-12 LAB — CK: CK TOTAL: 36 U/L — AB (ref 49–397)

## 2018-03-12 NOTE — Progress Notes (Signed)
PROGRESS NOTE    Eric Price  EXB:[id number] DOB: Mar 13, 1951 DOA: 03/07/2018 PCP: [name], MD    Brief Narrative:  67 year old male who presented with fever, confusion and cough. 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 Reported fevers for last few days prior to hospitalization, associated with generalized weakness, and malaise. On the initial physical examination his temperature was 38.8 Celsius, heart rate 117 222, blood pressure 145/86, respiratory 28, oxygen saturation 94%. Moist mucous membranes, lungs clear to auscultation bilaterally, heart S1-S2 present, tachycardic, abdomen soft nontender, lower extremities with no edema, left BKA. Sodium 134, potassium 4.0, chloride 94, bicarb 28, glucose 80, BUN 27, creatinine 1.96, venous lactic acid 1.7, white count 9.1, hemoglobin 12.4, hematocrit 38.4, platelets 249,arterial blood gas 7.39/42.8/75.1/25.3/94%.Urinalysis negative for infection. Drug screen negative. Head CT negative for acute changes. Chest x-ray, hypoinflated, no infiltrates, right internal jugular vein catheter,left-sided pacemaker. EKG, sinus rhythm, left axis,normal intervals.  Patient was admitted to the hospital withtheworking diagnosis of systemic inflammatoryresponsesyndrometo rule outsepsis,complicated bymetabolicencephalopathy andacutekidneyinjury.  Blood cultures from 6/4 resulted positive for enterococcus.   Assessment & Plan:   Principal Problem:   SIRS (systemic inflammatory response syndrome) (HCC) Active Problems:   Uncontrolled type 2 diabetes mellitus with peripheral neuropathy (HCC)   Essential hypertension   Mycosis fungoides (HCC)   NICM (nonischemic cardiomyopathy) (HCC)   CAD (coronary artery disease)   Chronic combined systolic and diastolic  CHF (congestive heart failure) ([place])   Renal insufficiency   Acute encephalopathy   1. Enterococcus bacteremia rule out port infection.Continue ampicillin until June 20. Plan for PICC line on Sunday. Follow up with the ID clinic as outpatient. Cultures remained with no growth.   2.Pre -renalAKI.has resolved, patient tolerating po diet, off IV fluids.   3. Metabolic encephalopathy.Resolved.  4. Diastolic heart failure.Continue carvedilol.No clinical signs of exacerbation.   5. Mycosis fungoides. Outpatient follow up with [facility].   6. T2DM.Glucose cover and monitoring with insulin sliding scale, capillary glucose 137, 153, 141, 130, 169.  25 units of basal insulin and  6 units pre-meal, short acting.   7. PVD.Apixaban has been resumed. Out of bed as tolerated. Continue atorvastatin.   DVT prophylaxis:enoxaparin Code Status:full Family Communication:no family at the bedside Disposition Plan:Home   Consultants:  ID  Procedures:    Antimicrobials:  Ampicillin   Subjective: Patient feeling better, port has been removed, no nausea or vomiting, no chest pain or dyspnea.   Objective: Vitals:   03/11/18 1601 03/11/18 2331 03/12/18 0600 03/12/18 0734  BP: 117/74 113/75  124/80  Pulse: 84 94  84  Resp: 16 18  18   Temp: 98.3 F (36.8 C) 98.7 F (37.1 C)  98.4 F (36.9 C)  TempSrc: Oral Oral  Oral  SpO2: 96% 96%  99%  Weight:   83.3 kg (183 lb 10.3 oz)   Height:        Intake/Output Summary (Last 24 hours) at 03/12/2018 0804 Last data filed at 03/12/2018 0524 Gross per 24 hour  Intake 920 ml  Output 1925 ml  Net -1005 ml   Filed Weights   03/10/18 0808 03/11/18 0444 03/12/18 0600  Weight: 84.4 kg (186 lb) 86 kg (189 lb 9.5 oz) 83.3 kg (183 lb 10.3 oz)    Examination:   General: not in pain or dyspnea Neurology: Awake and alert, non focal  E ENT: mild pallor, no icterus,  oral mucosa moist Cardiovascular: No JVD. S1-S2 present,  rhythmic, no gallops, rubs, or murmurs. No lower extremity edema. Pulmonary: vesicular breath sounds bilaterally, adequate air movement, no wheezing, rhonchi or rales. Gastrointestinal. Abdomen with no organomegaly, non tender, no rebound or guarding Skin. No rashes Musculoskeletal: no joint deformities/ left BKA     Data Reviewed: I have personally reviewed following labs and imaging studies  CBC: Recent Labs  Lab 03/08/18 0115 03/08/18 0349 03/09/18 0437 03/10/18 0550 03/11/18 0323  WBC 9.1 9.0 5.5 4.0 3.5*  NEUTROABS 8.0*  --   --  2.8 2.3  HGB 12.4* 10.9* 9.8* 9.5* 9.1*  HCT 38.4* 33.5* 30.2* 29.6* 28.4*  MCV 81.5 82.5 81.6 82.9 83.3  PLT 249 214 184 188 732   Basic Metabolic Panel: Recent Labs  Lab 03/08/18 0115 03/08/18 0349 03/09/18 0437 03/10/18 0550 03/11/18 0323  NA 134* 134* 138 141 140  K 4.0 4.0 3.9 3.8 3.9  CL 94* 95* 105 105 107  CO2 28 26 28 26 25   GLUCOSE 83 104* 101* 137* 137*  BUN 27* 27* 19 14 11   CREATININE 1.96* 1.96* 1.33* 1.16 1.00  CALCIUM 9.8 9.2 8.8* 9.2 9.0  PHOS  --   --  3.7 2.8 2.5   GFR: Estimated Creatinine Clearance: 71.7 mL/min (by C-G formula based on SCr of 1 mg/dL). Liver Function Tests: Recent Labs  Lab 03/08/18 0115 03/09/18 0437  AST 14* 18  ALT 15* 22  ALKPHOS 144* 104  BILITOT 0.6 0.5  PROT 7.5 6.2*  ALBUMIN 4.2 3.2*   No results for input(s): LIPASE, AMYLASE in the last 168 hours. No results for input(s): AMMONIA in the last 168 hours. Coagulation Profile: Recent Labs  Lab 03/09/18 0940 03/10/18 0550  INR 1.29 1.12   Cardiac Enzymes: Recent Labs  Lab 03/09/18 0437 03/12/18 0529  CKTOTAL 49 36*   BNP (last 3 results) No results for input(s): PROBNP in the last 8760 hours. HbA1C: No results for input(s): HGBA1C in the last 72 hours. CBG: Recent Labs  Lab 03/10/18 1703 03/10/18 2133 03/11/18 0734 03/11/18 1713 03/11/18 2119  GLUCAP 141* 169* 137* 153* 141*   Lipid Profile: No results for  input(s): CHOL, HDL, LDLCALC, TRIG, CHOLHDL, LDLDIRECT in the last 72 hours. Thyroid Function Tests: No results for input(s): TSH, T4TOTAL, FREET4, T3FREE, THYROIDAB in the last 72 hours. Anemia Panel: No results for input(s): VITAMINB12, FOLATE, FERRITIN, TIBC, IRON, RETICCTPCT in the last 72 hours.    Radiology Studies: I have reviewed all of the imaging during this hospital visit personally     Scheduled Meds: . apixaban  5 mg Oral BID  . atorvastatin  20 mg Oral q1800  . buPROPion  150 mg Oral Daily  . carvedilol  25 mg Oral BID WC  . clobetasol cream   Topical BID  . feeding supplement (ENSURE ENLIVE)  237 mL Oral BID BM  . folic acid  1 mg Oral Daily  . insulin aspart  0-15 Units Subcutaneous TID WC  . insulin aspart  0-5 Units Subcutaneous QHS  . insulin aspart  6 Units Subcutaneous TID WC  . insulin glargine  15 Units Subcutaneous QHS  . pantoprazole  40 mg Oral Daily  . pregabalin  75 mg Oral BID  . ramelteon  8 mg Oral QHS  . sodium chloride flush  3 mL Intravenous Q12H   Continuous Infusions: . ampicillin (OMNIPEN) IV Stopped (03/12/18 0542)     LOS: 4 days  Haylynn Pha Gerome Apley, MD [facility] Pager [phone]

## 2018-03-12 NOTE — Evaluation (Signed)
Physical Therapy Evaluation Patient Details Name: Eric Price MRN: [id number] DOB: [birth year] Today's Date: 03/12/2018   History of Present Illness  Eric Price is a 67 y.o. 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  Clinical Impression  Pt admitted with above. Pt with able to tolerate 300+ feet of ambulation with both the RW and straight cane. Pt denies pain this date and demonstrates good balance. Per wife pt with tendency to trip over his toes at home and has a hard time descending into the chair. Pt to benefit from HHPT upon d/c to address these concerns.    Follow Up Recommendations Home health PT;Supervision/Assistance - 24 hour    Equipment Recommendations  None recommended by PT    Recommendations for Other Services       Precautions / Restrictions Precautions Precautions: Fall Precaution Comments: pt on protective precautions due to being on chemo Restrictions Weight Bearing Restrictions: No      Mobility  Bed Mobility               General bed mobility comments: pt sitting EOB upon PT arrival  Transfers Overall transfer level: Needs assistance Equipment used: None Transfers: Sit to/from Stand Sit to Stand: Supervision         General transfer comment: pt able to push up from bed and complete safe transfer without using AD  Ambulation/Gait Ambulation/Gait assistance: Supervision Ambulation Distance (Feet): 300 Feet Assistive device: Rolling walker (2 wheeled);Straight cane Gait Pattern/deviations: Step-through pattern Gait velocity: wfl Gait velocity interpretation: 1.31 - 2.62 ft/sec, indicative of limited community ambulator General Gait Details: pt steady with both the RW and the straight  cane. pt with no episodes of LOB. pt demonstrates safe sequencing with the cane. per wife pt has tendency to trip over his toes. Pt reports that only happens when my legs/feet are hurting.  Stairs            Wheelchair Mobility    Modified Rankin (Stroke Patients Only)       Balance Overall balance assessment: Mild deficits observed, not formally tested                                           Pertinent Vitals/Pain Pain Assessment: No/denies pain    Home Living Family/patient expects to be discharged to:: Private residence Living Arrangements: Spouse/significant other Available Help at Discharge: Family;Available 24 hours/day Type of Home: House Home Access: Stairs to enter Entrance Stairs-Rails: Can reach both Entrance Stairs-Number of Steps: 3 Home Layout: Two level;Able to live on main level with bedroom/bathroom Home Equipment: Gilford Rile - 2 wheels;Cane - single point;Shower seat      Prior Function Level of Independence: Independent with assistive device(s)               Hand Dominance   Dominant Hand: Right    Extremity/Trunk Assessment   Upper Extremity Assessment Upper Extremity Assessment: Overall WFL for tasks assessed    Lower Extremity Assessment Lower Extremity Assessment: LLE deficits/detail;Generalized weakness LLE Deficits / Details: L BKA, 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    Cervical / Trunk Assessment Cervical / Trunk Assessment: Normal  Communication   Communication: No difficulties  Cognition Arousal/Alertness: Awake/alert Behavior  During Therapy: WFL for tasks assessed/performed Overall Cognitive Status: Within Functional Limits for tasks assessed                                        General Comments General comments (skin integrity, edema, etc.): pt donned prosthesis indep    Exercises     Assessment/Plan    PT Assessment Patient needs continued PT services  PT Problem List  Decreased activity tolerance;Decreased balance;Decreased mobility       PT Treatment Interventions DME instruction;Gait training;Stair training;Functional mobility training;Therapeutic activities;Therapeutic exercise;Balance training;Neuromuscular re-education    PT Goals (Current goals can be found in the Care Plan section)  Acute Rehab PT Goals Patient Stated Goal: not to get as weak as he did last time PT Goal Formulation: With patient/family Time For Goal Achievement: 03/26/18 Potential to Achieve Goals: Good    Frequency Min 2X/week   Barriers to discharge        Co-evaluation               AM-PAC PT "6 Clicks" Daily Activity  Outcome Measure Difficulty turning over in bed (including adjusting bedclothes, sheets and blankets)?: None Difficulty moving from lying on back to sitting on the side of the bed? : None Difficulty sitting down on and standing up from a chair with arms (e.g., wheelchair, bedside commode, etc,.)?: None Help needed moving to and from a bed to chair (including a wheelchair)?: None Help needed walking in hospital room?: A Little Help needed climbing 3-5 steps with a railing? : A Little 6 Click Score: 22    End of Session Equipment Utilized During Treatment: Gait belt Activity Tolerance: Patient tolerated treatment well Patient left: in chair;with call bell/phone within reach;with family/visitor present Nurse Communication: Mobility status PT Visit Diagnosis: Unsteadiness on feet (R26.81)    Time: 2831-5176 PT Time Calculation (min) (ACUTE ONLY): 17 min   Charges:   PT Evaluation $PT Eval Moderate Complexity: 1 Mod     PT G CodesKittie Plater, PT, DPT Pager #: [phone] Office #: [phone]   [place] 03/12/2018, 2:15 PM

## 2018-03-13 LAB — CBC WITH DIFFERENTIAL/PLATELET
Abs Immature Granulocytes: 0.1 10*3/uL (ref 0.0–0.1)
BASOS ABS: 0 10*3/uL (ref 0.0–0.1)
Basophils Relative: 0 %
EOS ABS: 0.1 10*3/uL (ref 0.0–0.7)
EOS PCT: 2 %
HCT: 28.9 % — ABNORMAL LOW (ref 39.0–52.0)
Hemoglobin: 9.1 g/dL — ABNORMAL LOW (ref 13.0–17.0)
Immature Granulocytes: 3 %
Lymphocytes Relative: 14 %
Lymphs Abs: 0.7 10*3/uL (ref 0.7–4.0)
MCH: 26.4 pg (ref 26.0–34.0)
MCHC: 31.5 g/dL (ref 30.0–36.0)
MCV: 83.8 fL (ref 78.0–100.0)
Monocytes Absolute: 0.4 10*3/uL (ref 0.1–1.0)
Monocytes Relative: 9 %
Neutro Abs: 3.3 10*3/uL (ref 1.7–7.7)
Neutrophils Relative %: 72 %
PLATELETS: 251 10*3/uL (ref 150–400)
RBC: 3.45 MIL/uL — AB (ref 4.22–5.81)
RDW: 13.7 % (ref 11.5–15.5)
WBC: 4.6 10*3/uL (ref 4.0–10.5)

## 2018-03-13 LAB — BASIC METABOLIC PANEL
Anion gap: 7 (ref 5–15)
BUN: 9 mg/dL (ref 6–20)
CALCIUM: 9.3 mg/dL (ref 8.9–10.3)
CO2: 27 mmol/L (ref 22–32)
CREATININE: 1.03 mg/dL (ref 0.61–1.24)
Chloride: 108 mmol/L (ref 101–111)
Glucose, Bld: 140 mg/dL — ABNORMAL HIGH (ref 65–99)
Potassium: 3.9 mmol/L (ref 3.5–5.1)
SODIUM: 142 mmol/L (ref 135–145)

## 2018-03-13 LAB — GLUCOSE, CAPILLARY
GLUCOSE-CAPILLARY: 140 mg/dL — AB (ref 65–99)
GLUCOSE-CAPILLARY: 152 mg/dL — AB (ref 65–99)

## 2018-03-13 MED ORDER — SODIUM CHLORIDE 0.9% FLUSH: 10.0000 mL | INTRAVENOUS | Status: DC | PRN

## 2018-03-13 MED ORDER — SODIUM CHLORIDE 0.9% FLUSH
10.0000 mL | Freq: Two times a day (BID) | INTRAVENOUS | Status: DC
  Administered 2018-03-13 – 2018-03-14 (×2): 10 mL

## 2018-03-13 NOTE — Progress Notes (Signed)
Patient had CBGs done today that did not carry from the hand held machine to the docking station.  Patient was 131 at 07:53, and 148 at 2:12.  Patient had a late lunch and insulin at approximately 2 pm as he had a PICC placed.  The dressing is intact on the Right upper arm and there was noted a small amount of old blood under the dressing at 15:30 prior to the hanging of his 4 pm Ampicillin.  The antibiotic was infused with his current peripheral IV on the Left forearm and the site was monitored for the PICC site for the next hour.  There was no new drainage and the dressing remained intact.

## 2018-03-13 NOTE — Progress Notes (Signed)
Spoke with Dr Johnnye Sima, states ok to place PICC on RUE.

## 2018-03-13 NOTE — Progress Notes (Signed)
PHARMACY CONSULT NOTE FOR:  OUTPATIENT  PARENTERAL ANTIBIOTIC THERAPY (OPAT)  Indication: Enterococcus bacteremia Regimen: Ampicillin 2 g IV every 4 hours End date: 03/24/2018  IV antibiotic discharge orders are pended. To discharging provider:  please sign these orders via discharge navigator,  Select New Orders & click on the button choice - Manage This Unsigned Work.     Thank you for allowing pharmacy to be a part of this patient's care.  Doylene Canard, PharmD Clinical Pharmacist  Pager: [phone] Phone: [phone] 03/13/2018, 1:43 PM

## 2018-03-13 NOTE — Progress Notes (Signed)
PROGRESS NOTE    Marjorie Lussier  DGU:[id number] DOB: 11-23-1950 DOA: 03/07/2018 PCP: [name], MD    Brief Narrative:  67 year old male who presented with fever, confusion and cough. 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 Reported fevers for last few days prior to hospitalization, associated with generalized weakness, and malaise. On the initial physical examination his temperature was 38.8 Celsius, heart rate 117 222, blood pressure 145/86, respiratory 28, oxygen saturation 94%. Moist mucous membranes, lungs clear to auscultation bilaterally, heart S1-S2 present, tachycardic, abdomen soft nontender, lower extremities with no edema, left BKA. Sodium 134, potassium 4.0, chloride 94, bicarb 28, glucose 80, BUN 27, creatinine 1.96, venous lactic acid 1.7, white count 9.1, hemoglobin 12.4, hematocrit 38.4, platelets 249,arterial blood gas 7.39/42.8/75.1/25.3/94%.Urinalysis negative for infection. Drug screen negative. Head CT negative for acute changes. Chest x-ray, hypoinflated, no infiltrates, right internal jugular vein catheter,left-sided pacemaker. EKG, sinus rhythm, left axis,normal intervals.  Patient was admitted to the hospital withtheworking diagnosis of systemic inflammatoryresponsesyndrometo rule outsepsis,complicated bymetabolicencephalopathy andacutekidneyinjury.  Blood cultures from 6/4 resulted positive for enterococcus.   Assessment & Plan:   Principal Problem:   SIRS (systemic inflammatory response syndrome) (HCC) Active Problems:   Uncontrolled type 2 diabetes mellitus with peripheral neuropathy (HCC)   Essential hypertension   Mycosis fungoides (HCC)   NICM (nonischemic cardiomyopathy) (HCC)   CAD (coronary artery disease)   Chronic combined systolic and diastolic  CHF (congestive heart failure) ([place])   Renal insufficiency   Acute encephalopathy   1. Enterococcus bacteremia due to port infection (present on admission).Plan for picc line today then to continue ampicillin until June 20. Blood cultures continue to be with no growth.   2.Pre -renalAKI.resolved.   3. Metabolic encephalopathy.Resolved.  4. Diastolic heart failure, chronic and stable with no signs of exacerbation.Tolerating well carvedilol.  5. Mycosis fungoides.Outpatient follow up with [facility], may have to hold on chemotherapy while receiving treatment for bacteremia.    6. T2DM.Continue glucose cover and monitoring with insulin sliding scale, capillary glucosehas  remained stable at 141, 130, 169, 147, 149. Continue 25 units of basal insulin and  6 units pre-meal, short acting.Tolerating po well.   7. PVD.Continue with apixaban.Out of bed as tolerated. Continue atorvastatin.  DVT prophylaxis:apixaban Code Status:full Family Communication:no family at the bedside Disposition Plan:Home   Consultants:  ID  Procedures:    Antimicrobials:  Ampicillin  Subjective: Patient feeling well, no nausea or vomiting, no chest pain or dyspnea.   Objective: Vitals:   03/12/18 0734 03/12/18 1730 03/12/18 2302 03/13/18 0751  BP: 124/80 111/67 122/78 126/82  Pulse: 84 92 89 92  Resp: 18 18 16 18   Temp: 98.4 F (36.9 C)  98.5 F (36.9 C) 98.7 F (37.1 C)  TempSrc: Oral  Oral   SpO2: 99% 99% 100% 97%  Weight:      Height:        Intake/Output Summary (Last 24 hours) at 03/13/2018 0835 Last data filed at 03/13/2018 0522 Gross per 24 hour  Intake 600 ml  Output 2700 ml  Net -2100 ml   Filed Weights   03/10/18 0808 03/11/18 0444 03/12/18 0600  Weight: 84.4 kg (186 lb) 86 kg (189 lb 9.5 oz) 83.3 kg (183 lb 10.3 oz)    Examination:   General: Not in pain or dyspnea Neurology: Awake and alert, non focal  E ENT: no pallor, no icterus,  oral mucosa moist Cardiovascular: No JVD. S1-S2 present, rhythmic, no gallops, rubs, or murmurs. No lower extremity edema. Pulmonary: vesicular breath sounds bilaterally, adequate air movement, no wheezing, rhonchi or rales. Gastrointestinal. Abdomen with no organomegaly, non tender, no rebound or guarding Skin. No rashes Musculoskeletal: no joint deformities/ left bka     Data Reviewed: I have personally reviewed following labs and imaging studies  CBC: Recent Labs  Lab 03/08/18 0115 03/08/18 0349 03/09/18 0437 03/10/18 0550 03/11/18 0323 03/13/18 0230  WBC 9.1 9.0 5.5 4.0 3.5* 4.6  NEUTROABS 8.0*  --   --  2.8 2.3 3.3  HGB 12.4* 10.9* 9.8* 9.5* 9.1* 9.1*  HCT 38.4* 33.5* 30.2* 29.6* 28.4* 28.9*  MCV 81.5 82.5 81.6 82.9 83.3 83.8  PLT 249 214 184 188 206 628   Basic Metabolic Panel: Recent Labs  Lab 03/08/18 0349 03/09/18 0437 03/10/18 0550 03/11/18 0323 03/13/18 0230  NA 134* 138 141 140 142  K 4.0 3.9 3.8 3.9 3.9  CL 95* 105 105 107 108  CO2 26 28 26 25 27   GLUCOSE 104* 101* 137* 137* 140*  BUN 27* 19 14 11 9   CREATININE 1.96* 1.33* 1.16 1.00 1.03  CALCIUM 9.2 8.8* 9.2 9.0 9.3  PHOS  --  3.7 2.8 2.5  --    GFR: Estimated Creatinine Clearance: 69.6 mL/min (by C-G formula based on SCr of 1.03 mg/dL). Liver Function Tests: Recent Labs  Lab 03/08/18 0115 03/09/18 0437  AST 14* 18  ALT 15* 22  ALKPHOS 144* 104  BILITOT 0.6 0.5  PROT 7.5 6.2*  ALBUMIN 4.2 3.2*   No results for input(s): LIPASE, AMYLASE in the last 168 hours. No results for input(s): AMMONIA in the last 168 hours. Coagulation Profile: Recent Labs  Lab 03/09/18 0940 03/10/18 0550  INR 1.29 1.12   Cardiac Enzymes: Recent Labs  Lab 03/09/18 0437 03/12/18 0529  CKTOTAL 49 36*   BNP (last 3 results) No results for input(s): PROBNP in the last 8760 hours. HbA1C: No results for input(s): HGBA1C in the last 72 hours. CBG: Recent Labs  Lab 03/11/18 2119 03/12/18 0730  03/12/18 1230 03/12/18 1712 03/12/18 2131  GLUCAP 141* 130* 169* 147* 149*   Lipid Profile: No results for input(s): CHOL, HDL, LDLCALC, TRIG, CHOLHDL, LDLDIRECT in the last 72 hours. Thyroid Function Tests: No results for input(s): TSH, T4TOTAL, FREET4, T3FREE, THYROIDAB in the last 72 hours. Anemia Panel: No results for input(s): VITAMINB12, FOLATE, FERRITIN, TIBC, IRON, RETICCTPCT in the last 72 hours.    Radiology Studies: I have reviewed all of the imaging during this hospital visit personally     Scheduled Meds: . apixaban  5 mg Oral BID  . atorvastatin  20 mg Oral q1800  . buPROPion  150 mg Oral Daily  . carvedilol  25 mg Oral BID WC  . clobetasol cream   Topical BID  . feeding supplement (ENSURE ENLIVE)  237 mL Oral BID BM  . folic acid  1 mg Oral Daily  . insulin aspart  0-15 Units Subcutaneous TID WC  . insulin aspart  0-5 Units Subcutaneous QHS  . insulin aspart  6 Units Subcutaneous TID WC  . insulin glargine  15 Units Subcutaneous QHS  . pantoprazole  40 mg Oral Daily  . pregabalin  75 mg Oral BID  . ramelteon  8 mg Oral QHS  . sodium chloride flush  3 mL Intravenous Q12H   Continuous Infusions: . ampicillin (OMNIPEN) IV Stopped (03/13/18 0522)  LOS: 5 days        Tawni Millers, MD [facility] Pager [phone]

## 2018-03-13 NOTE — Progress Notes (Signed)
Peripherally Inserted Central Catheter/Midline Placement  The IV Nurse has discussed with the patient and/or persons authorized to consent for the patient, the purpose of this procedure and the potential benefits and risks involved with this procedure.  The benefits include less needle sticks, lab draws from the catheter, and the patient may be discharged home with the catheter. Risks include, but not limited to, infection, bleeding, blood clot (thrombus formation), and puncture of an artery; nerve damage and irregular heartbeat and possibility to perform a PICC exchange if needed/ordered by physician.  Alternatives to this procedure were also discussed.  Bard Power PICC patient education guide, fact sheet on infection prevention and patient information card has been provided to patient /or left at bedside.   Spoke with wife after procedure re PICC care.  PICC/Midline Placement Documentation  PICC Single Lumen 03/13/18 PICC Right Brachial 40 cm 1 cm (Active)  Indication for Insertion or Continuance of Line Prolonged intravenous therapies;Home intravenous therapies (PICC only) 03/13/2018  1:31 PM  Exposed Catheter (cm) 1 cm 03/13/2018  1:31 PM  Site Assessment Clean;Dry;Intact 03/13/2018  1:31 PM  Line Status Flushed;Saline locked;Blood return noted 03/13/2018  1:31 PM  Dressing Type Transparent 03/13/2018  1:31 PM  Dressing Status Clean;Dry;Intact;Antimicrobial disc in place 03/13/2018  1:31 PM  Line Care Connections checked and tightened 03/13/2018  1:31 PM  Line Adjustment (NICU/IV Team Only) No 03/13/2018  1:31 PM  Dressing Intervention New dressing 03/13/2018  1:31 PM  Dressing Change Due 03/20/18 03/13/2018  1:31 PM       [name] 03/13/2018, 1:31 PM

## 2018-03-14 ENCOUNTER — Encounter: Payer: Self-pay | Admitting: *Deleted

## 2018-03-14 LAB — GLUCOSE, CAPILLARY
GLUCOSE-CAPILLARY: 131 mg/dL — AB (ref 65–99)
GLUCOSE-CAPILLARY: 148 mg/dL — AB (ref 65–99)
GLUCOSE-CAPILLARY: 104 mg/dL — AB (ref 65–99)
Glucose-Capillary: 148 mg/dL — ABNORMAL HIGH (ref 65–99)

## 2018-03-14 MED ORDER — AMPICILLIN IV (FOR PTA / DISCHARGE USE ONLY): 2.0000 g | INTRAVENOUS | 0 refills | Status: AC

## 2018-03-14 MED ORDER — ENSURE ENLIVE PO LIQD: 237.0000 mL | Freq: Two times a day (BID) | ORAL | 12 refills | Status: DC

## 2018-03-14 NOTE — Care Management Note (Signed)
Case Management Note  Patient Details  Name: Eric Price MRN: [id number] Date of Birth: March 14, 1951  Subjective/Objective:  Pt presented for fever, confusion and cough. PTA from home with the support of wife. Plan will be to transition home with [facility] 03-14-18. Agency List provided to patient.   Action/Plan: Patient and wife chose [facility] for IV antibiotic and RN/PT Services. Referral made to [facility] with [facility]. Pam will connect the patient between 2:00 pm and 3:00 pm for IV Antibiotics. Staff RN [place] of plan. CM did relay information to the patient. No further needs from CM at this time.    Expected Discharge Date:  03/14/18               Expected Discharge Plan:  [place]  In-House Referral:  NA  Discharge planning Services  CM Consult  Post Acute Care Choice:  Home Health, Durable Medical Equipment Choice offered to:  Patient, Spouse  DME Arranged:  IV pump/equipment DME Agency:  [place]:  RN, Disease Management, PT, IV Antibiotics HH Agency:  [place]  Status of Service:  Completed, signed off  If discussed at [place] of Stay Meetings, dates discussed:    Additional Comments:  Bethena Roys, RN 03/14/2018, 11:12 AM

## 2018-03-14 NOTE — Progress Notes (Signed)
    [facility] for Infectious Disease   Reason for visit: Follow up on bacteremia  Interval History: repeat blood cultures remain negative.  Physical Exam: Constitutional:  Vitals:   03/13/18 2347 03/14/18 0839  BP: 121/74 109/74  Pulse: 85 93  Resp: 15 19  Temp: 97.9 F (36.6 C) (!) 97.5 F (36.4 C)  SpO2: 100% 100%   patient appears in NAD  Impression: Enterococcal bacteremia, probable line infection, removal of line  Plan: 1.  Has picc line now 2. Continue ampicillin through June 20th, no absolute indication for synergy with gentamicin with negative TEE and port a cath removal, so will continue mono therapy.  He can follow up with his oncology team, we can follow up if needed.

## 2018-03-14 NOTE — Progress Notes (Signed)
Pt seen by MD, orders written for d/c.  IV antibiotics set up for pt by RN from [facility].  Went over discharge instructions with pt and wife, answered all questions.  Removed peripheral IV.  Getting discharged with single lumen PICC in RUA.  Escorted for discharge via wheelchair with all belongings.  Will follow up outpatient with MD.

## 2018-03-14 NOTE — Progress Notes (Signed)
PHARMACY CONSULT NOTE FOR:  OUTPATIENT  PARENTERAL ANTIBIOTIC THERAPY (OPAT)  Indication: Enterococcus bacteremia Regimen: Ampicillin 2g IV every 4 hours End date: 03/24/18  IV antibiotic discharge orders are pended. To discharging provider:  please sign these orders via discharge navigator,  Select New Orders & click on the button choice - Manage This Unsigned Work.     Thank you for allowing pharmacy to be a part of this patient's care.  Lawson Radar 03/14/2018, 9:49 AM

## 2018-03-14 NOTE — Discharge Summary (Addendum)
Physician Discharge Summary  Eric Price AGT:[id number] DOB: Oct 03, 1951 DOA: 03/07/2018  PCP: [name], MD  Admit date: 03/07/2018 Discharge date: 03/14/2018  Admitted From: Home  Disposition:  Home   Recommendations for Outpatient Follow-up and new medication changes:  1. Follow up with Dr. Elease Hashimoto in one week 2. Follow up with ID clinic in 7 days.  3. Follow up with oncology clinic at [facility] as scheduled 4. Continue antibiotic therapy with Ampicillin 2 g IV q 4 hours until June 20.  5. Further PICC management per primary Oncology at [facility].   Home Health: Yes   Equipment/Devices: Home infusion    Discharge Condition: stable  CODE STATUS: full  Diet recommendation:  Heart healthy and diabetic prudent  Brief/Interim Summary: 67 year old male who presented with fever, confusion and cough. 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 Reported fevers for last few days prior to hospitalization, associated with generalized weakness, and malaise. On the initial physical examination his temperature was 38.8 Celsius, heart rate 117 to 122 bpm, blood pressure 145/86, respiratory rate 28, oxygen saturation 94%. Moist mucous membranes, lungs clear to auscultation bilaterally, heart S1-S2 present, tachycardic, abdomen soft nontender, lower extremities with no edema, left BKA. Sodium 134, potassium 4.0, chloride 94, bicarb 28, glucose 80, BUN 27, creatinine 1.96, venous lactic acid 1.7, white count 9.1, hemoglobin 12.4, hematocrit 38.4, platelets 249,arterial blood gas 7.39/42.8/75.1/25.3/94%.Urinalysis negative for infection. Drug screen negative. Head CT negative for acute changes. Chest x-ray, hypoinflated, no infiltrates, right internal jugular vein catheter,left-sided pacemaker present. EKG, sinus rhythm, left  axis,normal intervals.  Patient was admitted to the hospital withtheworking diagnosis of systemic inflammatoryresponsesyndrometo rule outsepsis,complicated bymetabolicencephalopathy andacutekidneyinjury.  Blood cultures from 6/4 resulted positive for enterococcus.  1.  Enterococcus bacteremia due to port infection.  (Present on admission).  Patient was admitted to the stepdown unit, he received broad spectrum IV antibiotic therapy with vancomycin and Zosyn.  Blood cultures were obtained, antibiotic therapy was narrowed to daptomycin due to acute kidney injury.  Blood cultures resulted positive for enterococcus species, antibiotic therapy was further narrowed to intravenous ampicillin, following sensitivities.  Further work-up with transthoracic and transesophageal echocardiography, were negative for vegetation or pacemaker lead infection.  Right chest port was successfully removed, surveillance blood cultures remain no growth, and right upper extremity PICC line was placed for further IV antibiotic therapy.  Patient will complete therapy on June 20, follow-up with infectious disease clinic in 7 days.  2.  Prerenal acute kidney injury.  Patient received IV fluids, nephrotoxic agents were avoided, diuretics were held, by the time of discharge serum creatinine is down to 1.0, bicarbonate 27 and potassium 3.9.   3.  Metabolic encephalopathy.  Clinically improved, by the time of discharge patient was back to his baseline.  Patient was seen by physical therapy with recommendations for home health and home physical therapy, 24-hour assistance.  4.  Chronic diastolic heart failure, left ventricle ejection fraction 40 to 45%.  It has remains stable, will continue heart failure management with carvedilol, lisinopril,  resume diuretic therapy at discharge with furosemide and aldactone.   5.  Type 2 diabetes mellitus.  Patient was placed on insulin sliding scale for glucose coverage and monitoring,  his capillary glucose remained stable.  He received 25 units of basal insulin +6 units of pre-meal short acting insulin.  At discharge he will resume his home regimen with  dulaglutide, insulin Levemir 50 units twice daily, metformin 1000 milligrams twice daily and insulin (short acting) sliding scale.   6.  Peripheral vascular disease.  Patient will continue atorvastatin, resume apixaban at discharge.  Follow-up as an outpatient.  5.  Mycosis fungoides.  Patient follow-ups with [facility] outpatient oncology clinic, he is post 6 cycles of [facility], completed February 2019.  Currently on Mogamulizumab.   Discharge Diagnoses:  Principal Problem:   SIRS (systemic inflammatory response syndrome) (HCC) Active Problems:   Uncontrolled type 2 diabetes mellitus with peripheral neuropathy (HCC)   Essential hypertension   Mycosis fungoides (HCC)   NICM (nonischemic cardiomyopathy) (HCC)   CAD (coronary artery disease)   Chronic combined systolic and diastolic CHF (congestive heart failure) (HCC)   Renal insufficiency   Acute encephalopathy    Discharge Instructions  Discharge Instructions    AMB Referral to [facility]   Complete by:  As directed    Patient is 67 year old male with history of coronary artery disease, hypertension, type 2 diabetes mellitus, diastolic heart failure and 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  Patient and wife mentioned the need for further education, information and guidance on patient's diet (food to eat and to avoid) and need to reinforce  and monitor adherence with diet restrictions. Patient has been monitoring/ recording blood sugars and weight (has weighing scale) but has to be done regularly as stated.   Explained to patientand [facility]about [facility] and resources available forfurther health management at home andbothvoiced interest about it. Patient expressedwillingness to havetelephone calls to  provide  support for chronic disease management at home afterdischarge.   Patientverbally agreed and opted for referralto [place] for further disease education and management of chronic conditions.    Reason for consult:  Referral to [place] toprovide information/health educationand reinforce disease management of chronic conditions(HF/ DM)after discharge.   Diagnoses of:   Heart Failure Diabetes     Expected date of contact:  1-3 days (reserved for hospital discharges)     Allergies as of 03/14/2018      Reactions   Morphine Shortness Of Breath, Anaphylaxis   Morphine And Related    "took my breath away"   Oxycodone    Pt stated, "It makes me climbs walls; fight wars"      Medication List    TAKE these medications   albuterol 108 (90 Base) MCG/ACT inhaler Commonly known as:  PROVENTIL HFA;VENTOLIN HFA Inhale 2 puffs into the lungs every 4 (four) hours as needed for wheezing or shortness of breath.   ampicillin IVPB Inject 2 g into the vein every 4 (four) hours for 11 days. Indication:  Bacteremia Last Day of Therapy:  03/24/2018 Labs - Once weekly:  CBC/D and BMP, Labs - Every other week:  ESR and CRP   apixaban 5 MG Tabs tablet Commonly known as:  ELIQUIS Take 1 tablet (5 mg total) by mouth 2 (two) times daily.   atorvastatin 20 MG tablet Commonly known as:  LIPITOR TAKE 1 TABLET BY MOUTH EVERY DAY AT 6   buPROPion 150 MG 24 hr tablet Commonly known as:  WELLBUTRIN XL TAKE 1 TABLET(150 MG) BY MOUTH DAILY   carvedilol 25 MG tablet Commonly known as:  COREG Take 1 tablet (25 mg total) by mouth 2 (two) times daily with a meal.   clobetasol cream 0.05 % Commonly known as:  TEMOVATE APPLY 1 APPLICATION TOPICALLY TWICE DAILY   DOCQLACE 100  MG capsule Generic drug:  docusate sodium TAKE 1 CAPSULE BY MOUTH DAILY What changed:    how much to take  how to take this  when to take this   Dulaglutide 1.5 MG/0.5ML Sopn Commonly known as:   TRULICITY INJECT CONTENTS OF ONE PEN UNDER THE SKIN ONCE A WEEK   feeding supplement (ENSURE ENLIVE) Liqd Take 237 mLs by mouth 2 (two) times daily between meals.   folic acid 1 MG tablet Commonly known as:  FOLVITE Take 1 tablet (1 mg total) by mouth daily.   furosemide 80 MG tablet Commonly known as:  LASIX Take 1 tablet (80 mg total) by mouth daily.   glucagon 1 MG injection Inject 1 mg into the vein once as needed (for high blood sugar).   glucose blood test strip Commonly known as:  ONETOUCH VERIO USE TO CHECK BLOOD SUGAR TWICE DAILY   hydrOXYzine 25 MG tablet Commonly known as:  ATARAX/VISTARIL TAKE 1- 3 TABLETS BY MOUTH TWICE DAILY AS NEEDED FOR ITCHING   insulin aspart 100 UNIT/ML FlexPen Commonly known as:  NOVOLOG FLEXPEN Inject 34-40 units before meals, 3x a day What changed:    how much to take  how to take this  when to take this  additional instructions   Insulin Detemir 100 UNIT/ML Pen Commonly known as:  LEVEMIR FLEXTOUCH ADMINISTER 50 UNITS UNDER THE SKIN TWICE DAILY   lisinopril 2.5 MG tablet Commonly known as:  PRINIVIL,ZESTRIL Take 2 tablets (5 mg total) by mouth daily.   metFORMIN 1000 MG tablet Commonly known as:  GLUCOPHAGE Take 1 tablet (1,000 mg total) by mouth 2 (two) times daily with a meal.   nitroGLYCERIN 0.4 MG SL tablet Commonly known as:  NITROSTAT Place 1 tablet (0.4 mg total) under the tongue every 5 (five) minutes as needed for chest pain.   ONE TOUCH LANCETS Misc Check 2 times daily.   pantoprazole 40 MG tablet Commonly known as:  PROTONIX Take 1 tablet (40 mg total) by mouth daily.   pregabalin 75 MG capsule Commonly known as:  LYRICA Take 75 mg by mouth 2 (two) times daily.   promethazine 12.5 MG tablet Commonly known as:  PHENERGAN Take 12.5 mg by mouth every 6 (six) hours as needed for nausea or vomiting.   spironolactone 25 MG tablet Commonly known as:  ALDACTONE Take 1 tablet (25 mg total) by mouth  daily.   tiZANidine 2 MG tablet Commonly known as:  ZANAFLEX TAKE 1 TABLET(2 MG) BY MOUTH AT BEDTIME AS NEEDED FOR MUSCLE SPASMS   triamcinolone cream 0.1 % Commonly known as:  KENALOG Apply 1 application topically 2 (two) times daily.            Home Infusion Instuctions  (From admission, onward)        Start     Ordered   03/14/18 0000  Home infusion instructions [facility] May follow [facility] Dosing Protocol; May administer Cathflo as needed to maintain patency of vascular access device.; Flushing of vascular access device: per [facility] Protocol: 0.9% NaCl pre/post medica...    Question Answer Comment  Instructions May follow [facility] Dosing Protocol   Instructions May administer Cathflo as needed to maintain patency of vascular access device.   Instructions Flushing of vascular access device: per [facility] Protocol: 0.9% NaCl pre/post medication administration and prn patency; Heparin 100 u/ml, 50m for implanted ports and Heparin 10u/ml, 574mfor all other central venous catheters.   Instructions May follow [facility] Anaphylaxis Protocol  for First Dose Administration in the home: 0.9% NaCl at 25-50 ml/hr to maintain IV access for protocol meds. Epinephrine 0.3 ml IV/IM PRN and Benadryl 25-50 IV/IM PRN s/s of anaphylaxis.   Instructions [facility] Infusion Coordinator (RN) to assist per patient IV care needs in the home PRN.      03/14/18 3762      Allergies  Allergen Reactions  . Morphine Shortness Of Breath and Anaphylaxis  . Morphine And Related     "took my breath away"  . Oxycodone     Pt stated, "It makes me climbs walls; fight wars"    Consultations:  ID  Cardiology   IR   Procedures/Studies: Ct Head Wo Contrast  Result Date: 03/08/2018 CLINICAL DATA:  Weakness and dizziness. History of hypertension, diabetes, skin cancer. EXAM: CT HEAD WITHOUT CONTRAST TECHNIQUE: Contiguous axial images were obtained from the base of the skull through the vertex  without intravenous contrast. COMPARISON:  None. FINDINGS: BRAIN: No intraparenchymal hemorrhage, mass effect nor midline shift. The ventricles and sulci are normal for age. Patchy supratentorial white matter hypodensities of unexpected for patient's age, though non-specific are most compatible with chronic small vessel ischemic disease. No acute large vascular territory infarcts. No abnormal extra-axial fluid collections. Basal cisterns are patent. VASCULAR: Mild-to-moderate calcific atherosclerosis of the carotid siphons. SKULL: No skull fracture. No significant scalp soft tissue swelling. SINUSES/ORBITS: The mastoid air-cells and included paranasal sinuses are well-aerated.The included ocular globes and orbital contents are non-suspicious. OTHER: None. IMPRESSION: Normal noncontrast CT head for age. Electronically Signed   By: Elon Alas M.D.   On: 03/08/2018 06:12   US Renal  Result Date: 03/08/2018 CLINICAL DATA:  Elevated creatinine level. EXAM: RENAL / URINARY TRACT ULTRASOUND COMPLETE COMPARISON:  None. FINDINGS: Right Kidney: Length: 12.2 cm. Echogenicity within normal limits. No mass or hydronephrosis visualized. Left Kidney: Length: 12.4 cm. Echogenicity within normal limits. No mass or hydronephrosis visualized. Bladder: Appears normal for degree of bladder distention. IMPRESSION: Normal renal ultrasound. Electronically Signed   By: Marijo Conception, M.D.   On: 03/08/2018 10:47   Ir Removal [employer] W/ [place] W/o Fl Mod Sed  Result Date: 03/11/2018 INDICATION: 66 year old with Enterococcus bacteremia. Concern for line infection. Request for port removal. Port[facility]A-Cath was [facility] at [facility]. EXAM: REMOVAL RIGHT CHEST PORT-A-CATH MEDICATIONS: Inpatient receiving antibiotics. ANESTHESIA/SEDATION: Moderate (conscious) sedation was employed during this procedure. A total of Versed 1.0 mg and Fentanyl 50 mcg was administered intravenously. Moderate Sedation Time: 27 minutes. The patient's  level of consciousness and vital signs were monitored continuously by radiology nursing throughout the procedure under my direct supervision. FLUOROSCOPY TIME:  None COMPLICATIONS: None immediate. PROCEDURE: Informed written consent was obtained from the patient after a thorough discussion of the procedural risks, benefits and alternatives. All questions were addressed. Maximal Sterile Barrier Technique was utilized including caps, mask, sterile gowns, sterile gloves, sterile drape, hand hygiene and skin antiseptic. A timeout was performed prior to the initiation of the procedure. The right chest was prepped and draped in a sterile fashion. Lidocaine was utilized for local anesthesia. An incision was made over the previously healed surgical incision. Utilizing blunt dissection, the double-lumen port catheter and reservoir were removed from the underlying subcutaneous tissue in their entirety. The pocket was irrigated with a copious amount of sterile normal saline. The subcutaneous tissue was closed with 3-0 Vicryl interrupted subcutaneous stitches. A 4-0 Vicryl running subcuticular stitch was utilized to approximate the skin. Dermabond was applied. FINDINGS: Complete  removal of the double-lumen Port-A-Cath. The port pocket was clean without signs of infection. IMPRESSION: Successful right chest Port-A-Cath explant. Electronically Signed   By: Markus Daft M.D.   On: 03/11/2018 14:24   Dg Chest Port 1 View  Result Date: 03/08/2018 CLINICAL DATA:  Fever and shortness of breath. EXAM: PORTABLE CHEST 1 VIEW COMPARISON:  Radiograph 07/04/2017 FINDINGS: Right chest port with tip in the mid SVC. Left-sided pacemaker with lead overlying the right ventricle. Mild cardiomegaly with unchanged mediastinal contours. No focal airspace disease, pulmonary edema, large pleural effusion or pneumothorax. No acute osseous abnormalities. IMPRESSION: Mild cardiomegaly without acute chest finding. Electronically Signed   By: Jeb Levering M.D.   On: 03/08/2018 01:06   [place] Ekg Site Rite  Result Date: 03/12/2018 If Site Rite image not attached, placement could not be confirmed due to current cardiac rhythm.      Subjective: Patient is feeling well, no nausea or vomiting, no chest pain or dyspnea. No confusion or agitation.   Discharge Exam: Vitals:   03/13/18 2347 03/14/18 0839  BP: 121/74 109/74  Pulse: 85 93  Resp: 15 19  Temp: 97.9 F (36.6 C) (!) 97.5 F (36.4 C)  SpO2: 100% 100%   Vitals:   03/13/18 1732 03/13/18 2347 03/14/18 0443 03/14/18 0839  BP: 120/80 121/74  109/74  Pulse: 83 85  93  Resp: 16 15  19   Temp: 97.6 F (36.4 C) 97.9 F (36.6 C)  (!) 97.5 F (36.4 C)  TempSrc: Oral Oral  Oral  SpO2: 99% 100%  100%  Weight:   87.8 kg (193 lb 9 oz)   Height:        General: Not in pain or dyspnea.   Neurology: Awake and alert, non focal  E ENT: mild pallor, no icterus, oral mucosa moist Cardiovascular: No JVD. S1-S2 present, rhythmic, no gallops, rubs, or murmurs. No lower extremity edema. Pulmonary: vesicular breath sounds bilaterally, adequate air movement, no wheezing, rhonchi or rales. Gastrointestinal. Abdomen with no organomegaly, non tender, no rebound or guarding Skin. No rashes Musculoskeletal: no joint deformities/ left BKA.    The results of significant diagnostics from this hospitalization (including imaging, microbiology, ancillary and laboratory) are listed below for reference.     Microbiology: Recent Results (from the past 240 hour(s))  Blood Culture (routine x 2)     Status: Abnormal   Collection Time: 03/08/18  1:10 AM  Result Value Ref Range Status   Specimen Description BLOOD RIGHT ANTECUBITAL  Final   Special Requests   Final    BOTTLES DRAWN AEROBIC AND ANAEROBIC Blood Culture adequate volume   Culture  Setup Time   Final    GRAM POSITIVE COCCI IN BOTH AEROBIC AND ANAEROBIC BOTTLES CRITICAL VALUE NOTED.  VALUE IS CONSISTENT WITH PREVIOUSLY REPORTED AND CALLED  VALUE.    Culture (A)  Final    ENTEROCOCCUS FAECALIS SUSCEPTIBILITIES PERFORMED ON PREVIOUS CULTURE WITHIN THE LAST 5 DAYS. Performed at [facility], [place] [address] [place], [place] [address]    Report Status 03/11/2018 FINAL  Final  Blood Culture (routine x 2)     Status: Abnormal   Collection Time: 03/08/18  1:27 AM  Result Value Ref Range Status   Specimen Description BLOOD RIGHT PORTA CATH  Final   Special Requests   Final    BOTTLES DRAWN AEROBIC AND ANAEROBIC Blood Culture adequate volume   Culture  Setup Time   Final    GRAM POSITIVE COCCI IN PAIRS  IN CHAINS IN BOTH AEROBIC AND ANAEROBIC BOTTLES Organism ID to follow CRITICAL RESULT CALLED TO, READ BACK BY AND VERIFIED WITH: Ferne Coe PharmD 14:30 03/08/18 ([email or web address]) Performed at [facility], [address] [place], [place] [address]    Culture ENTEROCOCCUS FAECALIS (A)  Final   Report Status 03/11/2018 FINAL  Final   Organism ID, Bacteria ENTEROCOCCUS FAECALIS  Final      Susceptibility   Enterococcus faecalis - MIC*    AMPICILLIN <=2 SENSITIVE Sensitive     VANCOMYCIN 1 SENSITIVE Sensitive     GENTAMICIN SYNERGY SENSITIVE Sensitive     * ENTEROCOCCUS FAECALIS  Blood Culture ID Panel (Reflexed)     Status: Abnormal   Collection Time: 03/08/18  1:27 AM  Result Value Ref Range Status   Enterococcus species DETECTED (A) NOT DETECTED Final    Comment: CRITICAL RESULT CALLED TO, READ BACK BY AND VERIFIED WITH: Ferne Coe PharmD 14:30 03/08/18 ([email or web address])    Vancomycin resistance NOT DETECTED NOT DETECTED Final   Listeria monocytogenes NOT DETECTED NOT DETECTED Final   Staphylococcus species NOT DETECTED NOT DETECTED Final   Staphylococcus aureus NOT DETECTED NOT DETECTED Final   Streptococcus species NOT DETECTED NOT DETECTED Final   Streptococcus agalactiae NOT DETECTED NOT DETECTED Final   Streptococcus pneumoniae NOT DETECTED NOT DETECTED Final   Streptococcus pyogenes NOT DETECTED NOT DETECTED Final    Acinetobacter baumannii NOT DETECTED NOT DETECTED Final   Enterobacteriaceae species NOT DETECTED NOT DETECTED Final   Enterobacter cloacae complex NOT DETECTED NOT DETECTED Final   Escherichia coli NOT DETECTED NOT DETECTED Final   Klebsiella oxytoca NOT DETECTED NOT DETECTED Final   Klebsiella pneumoniae NOT DETECTED NOT DETECTED Final   Proteus species NOT DETECTED NOT DETECTED Final   Serratia marcescens NOT DETECTED NOT DETECTED Final   Haemophilus influenzae NOT DETECTED NOT DETECTED Final   Neisseria meningitidis NOT DETECTED NOT DETECTED Final   Pseudomonas aeruginosa NOT DETECTED NOT DETECTED Final   Candida albicans NOT DETECTED NOT DETECTED Final   Candida glabrata NOT DETECTED NOT DETECTED Final   Candida krusei NOT DETECTED NOT DETECTED Final   Candida parapsilosis NOT DETECTED NOT DETECTED Final   Candida tropicalis NOT DETECTED NOT DETECTED Final    Comment: Performed at [facility], [address] [place], [place] [address]  MRSA PCR Screening     Status: None   Collection Time: 03/08/18  9:51 AM  Result Value Ref Range Status   MRSA by PCR NEGATIVE NEGATIVE Final    Comment:        The GeneXpert MRSA Assay (FDA approved for NASAL specimens only), is one component of a comprehensive MRSA colonization surveillance program. It is not intended to diagnose MRSA infection nor to guide or monitor treatment for MRSA infections. Performed at [facility], [place] [address] [place], [place] [address]   Culture, blood (Routine X 2) w Reflex to ID Panel     Status: None (Preliminary result)   Collection Time: 03/10/18  5:45 AM  Result Value Ref Range Status   Specimen Description BLOOD LEFT ANTECUBITAL  Final   Special Requests   Final    BOTTLES DRAWN AEROBIC AND ANAEROBIC Blood Culture adequate volume   Culture   Final    NO GROWTH 3 DAYS Performed at [facility], [place] [address] [place],  [address]    Report Status PENDING  Incomplete   Culture, blood (Routine X 2) w Reflex to ID  Panel     Status: None (Preliminary result)   Collection Time: 03/10/18  6:00 AM  Result Value Ref Range Status   Specimen Description BLOOD LEFT HAND  Final   Special Requests   Final    BOTTLES DRAWN AEROBIC AND ANAEROBIC Blood Culture adequate volume   Culture   Final    NO GROWTH 3 DAYS Performed at [facility], [address], [place], [place] [address]    Report Status PENDING  Incomplete     Labs: BNP (last 3 results) No results for input(s): BNP in the last 8760 hours. Basic Metabolic Panel: Recent Labs  Lab 03/08/18 0349 03/09/18 0437 03/10/18 0550 03/11/18 0323 03/13/18 0230  NA 134* 138 141 140 142  K 4.0 3.9 3.8 3.9 3.9  CL 95* 105 105 107 108  CO2 26 28 26 25 27   GLUCOSE 104* 101* 137* 137* 140*  BUN 27* 19 14 11 9   CREATININE 1.96* 1.33* 1.16 1.00 1.03  CALCIUM 9.2 8.8* 9.2 9.0 9.3  PHOS  --  3.7 2.8 2.5  --    Liver Function Tests: Recent Labs  Lab 03/08/18 0115 03/09/18 0437  AST 14* 18  ALT 15* 22  ALKPHOS 144* 104  BILITOT 0.6 0.5  PROT 7.5 6.2*  ALBUMIN 4.2 3.2*   No results for input(s): LIPASE, AMYLASE in the last 168 hours. No results for input(s): AMMONIA in the last 168 hours. CBC: Recent Labs  Lab 03/08/18 0115 03/08/18 0349 03/09/18 0437 03/10/18 0550 03/11/18 0323 03/13/18 0230  WBC 9.1 9.0 5.5 4.0 3.5* 4.6  NEUTROABS 8.0*  --   --  2.8 2.3 3.3  HGB 12.4* 10.9* 9.8* 9.5* 9.1* 9.1*  HCT 38.4* 33.5* 30.2* 29.6* 28.4* 28.9*  MCV 81.5 82.5 81.6 82.9 83.3 83.8  PLT 249 214 184 188 206 251   Cardiac Enzymes: Recent Labs  Lab 03/09/18 0437 03/12/18 0529  CKTOTAL 49 36*   BNP: Invalid input(s): POCBNP CBG: Recent Labs  Lab 03/13/18 0753 03/13/18 1411 03/13/18 1728 03/13/18 2058 03/14/18 0753  GLUCAP 131* 148* 140* 152* 148*   D-Dimer No results for input(s): DDIMER in the last 72 hours. Hgb A1c No results for input(s): HGBA1C in the last 72 hours. Lipid  Profile No results for input(s): CHOL, HDL, LDLCALC, TRIG, CHOLHDL, LDLDIRECT in the last 72 hours. Thyroid function studies No results for input(s): TSH, T4TOTAL, T3FREE, THYROIDAB in the last 72 hours.  Invalid input(s): FREET3 Anemia work up No results for input(s): VITAMINB12, FOLATE, FERRITIN, TIBC, IRON, RETICCTPCT in the last 72 hours. Urinalysis    Component Value Date/Time   COLORURINE YELLOW 03/08/2018 0138   APPEARANCEUR CLEAR 03/08/2018 0138   LABSPEC 1.011 03/08/2018 0138   PHURINE 5.0 03/08/2018 0138   GLUCOSEU NEGATIVE 03/08/2018 0138   HGBUR NEGATIVE 03/08/2018 0138   BILIRUBINUR NEGATIVE 03/08/2018 0138   BILIRUBINUR negaitive 05/13/2016 1748   KETONESUR NEGATIVE 03/08/2018 0138   PROTEINUR NEGATIVE 03/08/2018 0138   UROBILINOGEN 0.2 05/13/2016 1748   UROBILINOGEN 1.0 03/31/2015 0117   NITRITE NEGATIVE 03/08/2018 0138   LEUKOCYTESUR NEGATIVE 03/08/2018 0138   [facility] Invalid input(s): PROCALCITONIN,  WBC,  LACTICIDVEN Microbiology Recent Results (from the past 240 hour(s))  Blood Culture (routine x 2)     Status: Abnormal   Collection Time: 03/08/18  1:10 AM  Result Value Ref Range Status   Specimen Description BLOOD RIGHT ANTECUBITAL  Final   Special Requests   Final    BOTTLES DRAWN AEROBIC  AND ANAEROBIC Blood Culture adequate volume   Culture  Setup Time   Final    GRAM POSITIVE COCCI IN BOTH AEROBIC AND ANAEROBIC BOTTLES CRITICAL VALUE NOTED.  VALUE IS CONSISTENT WITH PREVIOUSLY REPORTED AND CALLED VALUE.    Culture (A)  Final    ENTEROCOCCUS FAECALIS SUSCEPTIBILITIES PERFORMED ON PREVIOUS CULTURE WITHIN THE LAST 5 DAYS. Performed at [facility], [place] [address], [place], [place] [address]    Report Status 03/11/2018 FINAL  Final  Blood Culture (routine x 2)     Status: Abnormal   Collection Time: 03/08/18  1:27 AM  Result Value Ref Range Status   Specimen Description BLOOD RIGHT PORTA CATH  Final   Special Requests   Final    BOTTLES  DRAWN AEROBIC AND ANAEROBIC Blood Culture adequate volume   Culture  Setup Time   Final    GRAM POSITIVE COCCI IN PAIRS IN CHAINS IN BOTH AEROBIC AND ANAEROBIC BOTTLES Organism ID to follow CRITICAL RESULT CALLED TO, READ BACK BY AND VERIFIED WITH: Ferne Coe PharmD 14:30 03/08/18 ([email or web address]) Performed at [facility], [place] [address] [place], [place] [address]    Culture ENTEROCOCCUS FAECALIS (A)  Final   Report Status 03/11/2018 FINAL  Final   Organism ID, Bacteria ENTEROCOCCUS FAECALIS  Final      Susceptibility   Enterococcus faecalis - MIC*    AMPICILLIN <=2 SENSITIVE Sensitive     VANCOMYCIN 1 SENSITIVE Sensitive     GENTAMICIN SYNERGY SENSITIVE Sensitive     * ENTEROCOCCUS FAECALIS  Blood Culture ID Panel (Reflexed)     Status: Abnormal   Collection Time: 03/08/18  1:27 AM  Result Value Ref Range Status   Enterococcus species DETECTED (A) NOT DETECTED Final    Comment: CRITICAL RESULT CALLED TO, READ BACK BY AND VERIFIED WITH: Ferne Coe PharmD 14:30 03/08/18 ([email or web address])    Vancomycin resistance NOT DETECTED NOT DETECTED Final   Listeria monocytogenes NOT DETECTED NOT DETECTED Final   Staphylococcus species NOT DETECTED NOT DETECTED Final   Staphylococcus aureus NOT DETECTED NOT DETECTED Final   Streptococcus species NOT DETECTED NOT DETECTED Final   Streptococcus agalactiae NOT DETECTED NOT DETECTED Final   Streptococcus pneumoniae NOT DETECTED NOT DETECTED Final   Streptococcus pyogenes NOT DETECTED NOT DETECTED Final   Acinetobacter baumannii NOT DETECTED NOT DETECTED Final   Enterobacteriaceae species NOT DETECTED NOT DETECTED Final   Enterobacter cloacae complex NOT DETECTED NOT DETECTED Final   Escherichia coli NOT DETECTED NOT DETECTED Final   Klebsiella oxytoca NOT DETECTED NOT DETECTED Final   Klebsiella pneumoniae NOT DETECTED NOT DETECTED Final   Proteus species NOT DETECTED NOT DETECTED Final   Serratia marcescens NOT DETECTED NOT DETECTED Final   Haemophilus  influenzae NOT DETECTED NOT DETECTED Final   Neisseria meningitidis NOT DETECTED NOT DETECTED Final   Pseudomonas aeruginosa NOT DETECTED NOT DETECTED Final   Candida albicans NOT DETECTED NOT DETECTED Final   Candida glabrata NOT DETECTED NOT DETECTED Final   Candida krusei NOT DETECTED NOT DETECTED Final   Candida parapsilosis NOT DETECTED NOT DETECTED Final   Candida tropicalis NOT DETECTED NOT DETECTED Final    Comment: Performed at [facility], [address] [place], [place] [address]  MRSA PCR Screening     Status: None   Collection Time: 03/08/18  9:51 AM  Result Value Ref Range Status   MRSA by PCR NEGATIVE NEGATIVE Final    Comment:  The GeneXpert MRSA Assay (FDA approved for NASAL specimens only), is one component of a comprehensive MRSA colonization surveillance program. It is not intended to diagnose MRSA infection nor to guide or monitor treatment for MRSA infections. Performed at [facility], [place] [address], [place], [place] [address]   Culture, blood (Routine X 2) w Reflex to ID Panel     Status: None (Preliminary result)   Collection Time: 03/10/18  5:45 AM  Result Value Ref Range Status   Specimen Description BLOOD LEFT ANTECUBITAL  Final   Special Requests   Final    BOTTLES DRAWN AEROBIC AND ANAEROBIC Blood Culture adequate volume   Culture   Final    NO GROWTH 3 DAYS Performed at [facility], [place] [address] [place], [place] [address]    Report Status PENDING  Incomplete  Culture, blood (Routine X 2) w Reflex to ID Panel     Status: None (Preliminary result)   Collection Time: 03/10/18  6:00 AM  Result Value Ref Range Status   Specimen Description BLOOD LEFT HAND  Final   Special Requests   Final    BOTTLES DRAWN AEROBIC AND ANAEROBIC Blood Culture adequate volume   Culture   Final    NO GROWTH 3 DAYS Performed at [facility], [place] [address], [place],  [address]    Report Status PENDING  Incomplete      Time coordinating discharge: 45 minutes  SIGNED:   Tawni Millers, MD  [facility] 03/14/2018, 9:13 AM Pager [phone]  If 7PM-7AM, please contact night-coverage [email or web address] Password [email or web address]

## 2018-03-15 ENCOUNTER — Telehealth: Payer: Self-pay | Admitting: Family Medicine

## 2018-03-15 DIAGNOSIS — C8400 Mycosis fungoides, unspecified site: Secondary | ICD-10-CM | POA: Diagnosis not present

## 2018-03-15 DIAGNOSIS — E1151 Type 2 diabetes mellitus with diabetic peripheral angiopathy without gangrene: Secondary | ICD-10-CM | POA: Diagnosis not present

## 2018-03-15 DIAGNOSIS — I2510 Atherosclerotic heart disease of native coronary artery without angina pectoris: Secondary | ICD-10-CM | POA: Diagnosis not present

## 2018-03-15 DIAGNOSIS — D631 Anemia in chronic kidney disease: Secondary | ICD-10-CM | POA: Diagnosis not present

## 2018-03-15 DIAGNOSIS — R6520 Severe sepsis without septic shock: Secondary | ICD-10-CM | POA: Diagnosis not present

## 2018-03-15 DIAGNOSIS — E785 Hyperlipidemia, unspecified: Secondary | ICD-10-CM | POA: Diagnosis not present

## 2018-03-15 DIAGNOSIS — N183 Chronic kidney disease, stage 3 (moderate): Secondary | ICD-10-CM | POA: Diagnosis not present

## 2018-03-15 DIAGNOSIS — T827XXA Infection and inflammatory reaction due to other cardiac and vascular devices, implants and grafts, initial encounter: Secondary | ICD-10-CM | POA: Diagnosis not present

## 2018-03-15 DIAGNOSIS — E1142 Type 2 diabetes mellitus with diabetic polyneuropathy: Secondary | ICD-10-CM | POA: Diagnosis not present

## 2018-03-15 DIAGNOSIS — I110 Hypertensive heart disease with heart failure: Secondary | ICD-10-CM | POA: Diagnosis not present

## 2018-03-15 DIAGNOSIS — I428 Other cardiomyopathies: Secondary | ICD-10-CM | POA: Diagnosis not present

## 2018-03-15 DIAGNOSIS — I5042 Chronic combined systolic (congestive) and diastolic (congestive) heart failure: Secondary | ICD-10-CM | POA: Diagnosis not present

## 2018-03-15 DIAGNOSIS — Z452 Encounter for adjustment and management of vascular access device: Secondary | ICD-10-CM | POA: Diagnosis not present

## 2018-03-15 DIAGNOSIS — N179 Acute kidney failure, unspecified: Secondary | ICD-10-CM | POA: Diagnosis not present

## 2018-03-15 DIAGNOSIS — A4181 Sepsis due to Enterococcus: Secondary | ICD-10-CM | POA: Diagnosis not present

## 2018-03-15 DIAGNOSIS — I130 Hypertensive heart and chronic kidney disease with heart failure and stage 1 through stage 4 chronic kidney disease, or unspecified chronic kidney disease: Secondary | ICD-10-CM | POA: Diagnosis not present

## 2018-03-15 LAB — CULTURE, BLOOD (ROUTINE X 2)
CULTURE: NO GROWTH
CULTURE: NO GROWTH
SPECIAL REQUESTS: ADEQUATE
Special Requests: ADEQUATE

## 2018-03-15 NOTE — Telephone Encounter (Signed)
Transition Care Management Follow-up Telephone Call  Eric Price OEV:[id number] DOB: 05-17-1951 DOA: 03/07/2018  PCP: [name], MD  Admit date: 03/07/2018 Discharge date: 03/14/2018  Admitted From: Home  Disposition:  Home   Recommendations for Outpatient Follow-up and new medication changes:  1. Follow up with Dr. Elease Hashimoto in one week 2. Follow up with ID clinic in 7 days.  3. Follow up with oncology clinic at [facility] as scheduled 4. Continue antibiotic therapy with Ampicillin 2 g IV q 4 hours until June 20.  5. Further PICC management per primary Oncology at [facility].   Home Health: Yes   Equipment/Devices: Home infusion    Discharge Condition: stable  CODE STATUS: full  Diet recommendation:  Heart healthy and diabetic prudent  Brief/Interim Summary: 67 year old male who presented with fever, confusion and cough. 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 Reported fevers for last few days prior to hospitalization, associated with generalized weakness, and malaise. On the initial physical examination his temperature was 38.8 Celsius, heart rate 117 to 122 bpm, blood pressure 145/86, respiratory rate 28, oxygen saturation 94%. Moist mucous membranes, lungs clear to auscultation bilaterally, heart S1-S2 present, tachycardic, abdomen soft nontender, lower extremities with no edema, left BKA. Sodium 134, potassium 4.0, chloride 94, bicarb 28, glucose 80, BUN 27, creatinine 1.96, venous lactic acid 1.7, white count 9.1, hemoglobin 12.4, hematocrit 38.4, platelets 249,arterial blood gas 7.39/42.8/75.1/25.3/94%.Urinalysis negative for infection. Drug screen negative. Head CT negative for acute changes. Chest x-ray, hypoinflated, no infiltrates, right internal jugular vein catheter,left-sided pacemaker  present. EKG, sinus rhythm, left axis,normal intervals.    How have you been since you were released from the hospital? "okay"   Do you understand why you were in the hospital? yes   Do you understand the discharge instructions? yes   Where were you discharged to? Home   Items Reviewed:  Medications reviewed: yes  Allergies reviewed: yes  Dietary changes reviewed: yes  Referrals reviewed: yes   Functional Questionnaire:   Activities of Daily Living (ADLs):   He states they are independent in the following: ambulation, bathing and hygiene, feeding, continence, grooming, toileting and dressing States they require assistance with the following: none   Any transportation issues/concerns?: no   Any patient concerns? no   Confirmed importance and date/time of follow-up visits scheduled yes  Provider Appointment booked with Dr. Elease Hashimoto on 03/25/2018 Friday at 11:00 am  Confirmed with patient if condition begins to worsen call PCP or go to the ER.  Patient was given the office number and encouraged to call back with question or concerns.  : yes

## 2018-03-16 DIAGNOSIS — C8400 Mycosis fungoides, unspecified site: Secondary | ICD-10-CM | POA: Diagnosis not present

## 2018-03-16 DIAGNOSIS — Z5111 Encounter for antineoplastic chemotherapy: Secondary | ICD-10-CM | POA: Diagnosis not present

## 2018-03-17 DIAGNOSIS — A4181 Sepsis due to Enterococcus: Secondary | ICD-10-CM | POA: Diagnosis not present

## 2018-03-17 DIAGNOSIS — I110 Hypertensive heart disease with heart failure: Secondary | ICD-10-CM | POA: Diagnosis not present

## 2018-03-17 DIAGNOSIS — T827XXA Infection and inflammatory reaction due to other cardiac and vascular devices, implants and grafts, initial encounter: Secondary | ICD-10-CM | POA: Diagnosis not present

## 2018-03-17 DIAGNOSIS — N179 Acute kidney failure, unspecified: Secondary | ICD-10-CM | POA: Diagnosis not present

## 2018-03-17 DIAGNOSIS — I2510 Atherosclerotic heart disease of native coronary artery without angina pectoris: Secondary | ICD-10-CM | POA: Diagnosis not present

## 2018-03-17 DIAGNOSIS — R6520 Severe sepsis without septic shock: Secondary | ICD-10-CM | POA: Diagnosis not present

## 2018-03-17 DIAGNOSIS — C8400 Mycosis fungoides, unspecified site: Secondary | ICD-10-CM | POA: Diagnosis not present

## 2018-03-17 DIAGNOSIS — I5042 Chronic combined systolic (congestive) and diastolic (congestive) heart failure: Secondary | ICD-10-CM | POA: Diagnosis not present

## 2018-03-17 DIAGNOSIS — I428 Other cardiomyopathies: Secondary | ICD-10-CM | POA: Diagnosis not present

## 2018-03-17 DIAGNOSIS — Z452 Encounter for adjustment and management of vascular access device: Secondary | ICD-10-CM | POA: Diagnosis not present

## 2018-03-18 ENCOUNTER — Telehealth: Payer: Self-pay | Admitting: Family Medicine

## 2018-03-18 NOTE — Telephone Encounter (Signed)
ok 

## 2018-03-18 NOTE — Telephone Encounter (Signed)
Copied from [place] [id number]. Topic: General - Other >> Mar 18, 2018 11:59 AM Yvette Rack wrote: Reason for CRM: [name] [phone] calling for verbal orders home PT for  1 time a week for 1 week   2 times a week for 1 week  1 time a week for 1 week

## 2018-03-18 NOTE — Telephone Encounter (Signed)
Verbal orders given to [employer]

## 2018-03-21 ENCOUNTER — Telehealth: Payer: Self-pay | Admitting: Cardiovascular Disease

## 2018-03-21 ENCOUNTER — Other Ambulatory Visit: Payer: Self-pay | Admitting: *Deleted

## 2018-03-21 ENCOUNTER — Ambulatory Visit (INDEPENDENT_AMBULATORY_CARE_PROVIDER_SITE_OTHER): Payer: Medicare HMO

## 2018-03-21 DIAGNOSIS — Z95810 Presence of automatic (implantable) cardiac defibrillator: Secondary | ICD-10-CM

## 2018-03-21 DIAGNOSIS — I428 Other cardiomyopathies: Secondary | ICD-10-CM | POA: Diagnosis not present

## 2018-03-21 DIAGNOSIS — R6520 Severe sepsis without septic shock: Secondary | ICD-10-CM | POA: Diagnosis not present

## 2018-03-21 DIAGNOSIS — I110 Hypertensive heart disease with heart failure: Secondary | ICD-10-CM | POA: Diagnosis not present

## 2018-03-21 DIAGNOSIS — C8400 Mycosis fungoides, unspecified site: Secondary | ICD-10-CM | POA: Diagnosis not present

## 2018-03-21 DIAGNOSIS — T827XXA Infection and inflammatory reaction due to other cardiac and vascular devices, implants and grafts, initial encounter: Secondary | ICD-10-CM | POA: Diagnosis not present

## 2018-03-21 DIAGNOSIS — I2510 Atherosclerotic heart disease of native coronary artery without angina pectoris: Secondary | ICD-10-CM | POA: Diagnosis not present

## 2018-03-21 DIAGNOSIS — I5042 Chronic combined systolic (congestive) and diastolic (congestive) heart failure: Secondary | ICD-10-CM

## 2018-03-21 DIAGNOSIS — Z452 Encounter for adjustment and management of vascular access device: Secondary | ICD-10-CM | POA: Diagnosis not present

## 2018-03-21 DIAGNOSIS — A4181 Sepsis due to Enterococcus: Secondary | ICD-10-CM | POA: Diagnosis not present

## 2018-03-21 DIAGNOSIS — N179 Acute kidney failure, unspecified: Secondary | ICD-10-CM | POA: Diagnosis not present

## 2018-03-21 DIAGNOSIS — R6510 Systemic inflammatory response syndrome (SIRS) of non-infectious origin without acute organ dysfunction: Secondary | ICD-10-CM | POA: Diagnosis not present

## 2018-03-21 NOTE — Telephone Encounter (Signed)
Latest transmission faxed also included on fax cover sheet that [facility] would need to contact [employer] regarding programming and if RV lead is MRI compatible phone number to [employer] provided and device clinic number also given.

## 2018-03-21 NOTE — Telephone Encounter (Signed)
New Message:      Eric Price is calling from [employer] department and states he is needing some more information regarding this pt.

## 2018-03-21 NOTE — Patient Outreach (Signed)
[facility]) Care Management  03/21/2018   Eric Price 1951-01-15 [id number]  RN Health Coach telephone call to patient.  [employer] compliance verified. Patient requested that [place] talk with wife. Per patient wife his fasting blood sugar today was 83. Patient checks blood sugars AC and HS. Per wife patient is actively exercising with PT, walking and stretch bands. Patient uses a cane, rolling walker and sometimes is able to walk with no additional assistance. Patient wife stated that he tries to eat a heart healthy, diabetic, low sodium diet. Patient and wife are agreeable to further outreach calls.     Current Medications:  Current Outpatient Medications  Medication Sig Dispense Refill  . albuterol (PROVENTIL HFA;VENTOLIN HFA) 108 (90 Base) MCG/ACT inhaler Inhale 2 puffs into the lungs every 4 (four) hours as needed for wheezing or shortness of breath. 1 Inhaler 2  . ampicillin IVPB Inject 2 g into the vein every 4 (four) hours for 11 days. Indication:  Bacteremia Last Day of Therapy:  03/24/2018 Labs - Once weekly:  CBC/D and BMP, Labs - Every other week:  ESR and CRP 66 Units 0  . apixaban (ELIQUIS) 5 MG TABS tablet Take 1 tablet (5 mg total) by mouth 2 (two) times daily. 60 tablet 5  . atorvastatin (LIPITOR) 20 MG tablet TAKE 1 TABLET BY MOUTH EVERY DAY AT 6 90 tablet 0  . buPROPion (WELLBUTRIN XL) 150 MG 24 hr tablet TAKE 1 TABLET(150 MG) BY MOUTH DAILY 90 tablet 1  . carvedilol (COREG) 25 MG tablet Take 1 tablet (25 mg total) by mouth 2 (two) times daily with a meal. 60 tablet 3  . clobetasol cream (TEMOVATE) 9.70 % APPLY 1 APPLICATION TOPICALLY TWICE DAILY 30 g 2  . DOCQLACE 100 MG capsule TAKE 1 CAPSULE BY MOUTH DAILY (Patient taking differently: TAKE 1 CAPSULE BY MOUTH DAILY AS NEEDED) 30 capsule 0  . Dulaglutide (TRULICITY) 1.68 YO/3.7CH SOPN INJECT CONTENTS OF ONE PEN UNDER THE SKIN ONCE A WEEK 12 pen 3  . feeding supplement, ENSURE ENLIVE, (ENSURE ENLIVE)  LIQD Take 237 mLs by mouth 2 (two) times daily between meals. 885 mL 12  . folic acid (FOLVITE) 1 MG tablet Take 1 tablet (1 mg total) by mouth daily.    . furosemide (LASIX) 80 MG tablet Take 1 tablet (80 mg total) by mouth daily. 90 tablet 3  . glucagon 1 MG injection Inject 1 mg into the vein once as needed (for high blood sugar).     Marland Kitchen glucose blood (ONETOUCH VERIO) test strip USE TO CHECK BLOOD SUGAR TWICE DAILY 200 each 3  . hydrOXYzine (ATARAX/VISTARIL) 25 MG tablet TAKE 1- 3 TABLETS BY MOUTH TWICE DAILY AS NEEDED FOR ITCHING (Patient not taking: Reported on 03/15/2018) 180 tablet 0  . insulin aspart (NOVOLOG FLEXPEN) 100 UNIT/ML FlexPen Inject 34-40 units before meals, 3x a day (Patient taking differently: Inject 34-40 Units into the skin 3 (three) times daily with meals. Sliding scale) 30 mL 5  . Insulin Detemir (LEVEMIR FLEXTOUCH) 100 UNIT/ML Pen ADMINISTER 50 UNITS UNDER THE SKIN TWICE DAILY 30 mL 0  . lisinopril (PRINIVIL,ZESTRIL) 2.5 MG tablet Take 2 tablets (5 mg total) by mouth daily. 180 tablet 3  . metFORMIN (GLUCOPHAGE) 1000 MG tablet Take 1 tablet (1,000 mg total) by mouth 2 (two) times daily with a meal. 180 tablet 3  . nitroGLYCERIN (NITROSTAT) 0.4 MG SL tablet Place 1 tablet (0.4 mg total) under the tongue every 5 (five) minutes as  needed for chest pain. (Patient not taking: Reported on 03/15/2018) 60 tablet 12  . ONE TOUCH LANCETS MISC Check 2 times daily. 100 each 3  . pantoprazole (PROTONIX) 40 MG tablet Take 1 tablet (40 mg total) by mouth daily. 90 tablet 3  . pregabalin (LYRICA) 75 MG capsule Take 75 mg by mouth 2 (two) times daily.    . promethazine (PHENERGAN) 12.5 MG tablet Take 12.5 mg by mouth every 6 (six) hours as needed for nausea or vomiting.    Marland Kitchen spironolactone (ALDACTONE) 25 MG tablet Take 1 tablet (25 mg total) by mouth daily. 90 tablet 3  . tiZANidine (ZANAFLEX) 2 MG tablet TAKE 1 TABLET(2 MG) BY MOUTH AT BEDTIME AS NEEDED FOR MUSCLE SPASMS 90 tablet 0  .  triamcinolone cream (KENALOG) 0.1 % Apply 1 application topically 2 (two) times daily.      No current facility-administered medications for this visit.     Functional Status:  In your present state of health, do you have any difficulty performing the following activities: 03/21/2018 03/08/2018  Hearing? N N  Vision? N N  Difficulty concentrating or making decisions? [name]  Walking or climbing stairs? Y Y  Dressing or bathing? Y Y  Doing errands, shopping? [name]  Preparing Food and eating ? Y -  Using the Toilet? N -  In the past six months, have you accidently leaked urine? Y -  Do you have problems with loss of bowel control? N -  Managing your Medications? N -  Managing your Finances? N -  Housekeeping or managing your Housekeeping? Y -  Some recent data might be hidden    Fall/Depression Screening: Fall Risk  03/21/2018 07/01/2016 06/22/2016  Falls in the past year? Yes Yes Yes  Number falls in past yr: 1 1 1   Injury with Fall? - Yes -  Risk Factor Category  - High Fall Risk -  Risk for fall due to : History of fall(s);Impaired balance/gait;Impaired mobility - -  Follow up Falls evaluation completed;Education provided;Falls prevention discussed Falls evaluation completed;Education provided Education provided;Falls prevention discussed   PHQ 2/9 Scores 03/21/2018 07/01/2016 05/24/2015 04/22/2015  PHQ - 2 Score 0 0 2 3  PHQ- 9 Score - - - 14   THN CM Care Plan Problem One     Most Recent Value  Care Plan Problem One  Knowledge Deficit in Self Management of Diabetes  Role Documenting the Problem One  [place] for Problem One  Active  THN Long Term Goal   Patient A1C will remain at 6.3 or < with Knowledge at next blood draw  [place] Term Goal Start Date  03/21/18  Interventions for Problem One Long Term Goal  RN discussed with patient wife about  the A1C. RN discussed checking blood sugars and eating healthy to maintain A1C and prevent other health risks. RN sent Living  Well with diabetes booklet. RN will follow up with further outreach  [facility] CM Short Term Goal #1   Patient and wife will have a better understanding of healthy snacks within the next 30 days  THN CM Short Term Goal #1 Start Date  03/21/18  Interventions for Short Term Goal #1  RN discussed eating meals reguarly and healthy snacks that can be eaten in between meals. RN sent a list of healthy snacks. RN will follow up with further discussion and teach back  THN CM Short Term Goal #2   Patient and wife will have a  better understanding of selctions to make from dinning out within the next 30 days  THN CM Short Term Goal #2 Start Date  03/21/18  Interventions for Short Term Goal #2  RN discussed dininning out. RN sent  list of foods to select when dining out for diabetics. RN will follow up with further discussion and teach back  THN CM Short Term Goal #3  P_atient and wife will have a better understanding of hypo and hyperglycemic rections and action pln within the next 30 days  THN CM Short Term Goal #3 Start Date  03/21/18  Interventions for Short Tern Goal #3  RN discussed with wife the patient symptoms of hyper and hypoglycemia. RN sent facial sheet of symptoms. RN sent EMMI educational material of hypo and hyperglycemia. RN will follow up with further education.       Assessment:  A1C 6.3 PT is working with patient Patient has routine exercises  Appetite good Patient is still receiving chemotherapy Patient will benefit from [employer] telephonic outreach for education and support for diabetes self management. Plan:  RN sent Living well with Diabetes book RN sent 2019 Calendar Book RN discussed A1C RN discussed Hypo and Hyperglycemia symptoms and action plan RN sent Facial chart of hypo and hyperglycemia RN sent EMMI education on hypo and hyperglycemia RN sent dinning out list for Diabetics RN sent a List of Healthy Snacks for Diabetics RN sent barrier and assessment to PCP RN will  follow up within the month of July  Wiktoria Hemrick [place] Management [phone]

## 2018-03-21 NOTE — Telephone Encounter (Signed)
Spoke with Eric Price who emailed [facility] Form, form filled out and faxed back.

## 2018-03-21 NOTE — Telephone Encounter (Signed)
Returned call to Rebecca with [facility]. Patient is needing to have this test scheduled and he has a [employer] pacemaker. They are needing programming instructions to be faxed to Attn: Bethena Roys or Leroy Sea @ [phone]. They would appreciate a call if patient is needing an in office interrogation or visit prior to providing such instructions, so they know there will be a bit of delay in being able to schedule the test for the patient.   Routed to [place] clinic for follow up

## 2018-03-22 DIAGNOSIS — T827XXA Infection and inflammatory reaction due to other cardiac and vascular devices, implants and grafts, initial encounter: Secondary | ICD-10-CM | POA: Diagnosis not present

## 2018-03-22 DIAGNOSIS — R0982 Postnasal drip: Secondary | ICD-10-CM | POA: Diagnosis not present

## 2018-03-22 DIAGNOSIS — I2510 Atherosclerotic heart disease of native coronary artery without angina pectoris: Secondary | ICD-10-CM | POA: Diagnosis not present

## 2018-03-22 DIAGNOSIS — A4181 Sepsis due to Enterococcus: Secondary | ICD-10-CM | POA: Diagnosis not present

## 2018-03-22 DIAGNOSIS — I110 Hypertensive heart disease with heart failure: Secondary | ICD-10-CM | POA: Diagnosis not present

## 2018-03-22 DIAGNOSIS — R490 Dysphonia: Secondary | ICD-10-CM | POA: Insufficient documentation

## 2018-03-22 DIAGNOSIS — R6520 Severe sepsis without septic shock: Secondary | ICD-10-CM | POA: Diagnosis not present

## 2018-03-22 DIAGNOSIS — C8409 Mycosis fungoides, extranodal and solid organ sites: Secondary | ICD-10-CM | POA: Diagnosis not present

## 2018-03-22 DIAGNOSIS — I428 Other cardiomyopathies: Secondary | ICD-10-CM | POA: Diagnosis not present

## 2018-03-22 DIAGNOSIS — I5042 Chronic combined systolic (congestive) and diastolic (congestive) heart failure: Secondary | ICD-10-CM | POA: Diagnosis not present

## 2018-03-22 DIAGNOSIS — C8400 Mycosis fungoides, unspecified site: Secondary | ICD-10-CM | POA: Diagnosis not present

## 2018-03-22 DIAGNOSIS — Z452 Encounter for adjustment and management of vascular access device: Secondary | ICD-10-CM | POA: Diagnosis not present

## 2018-03-22 DIAGNOSIS — J301 Allergic rhinitis due to pollen: Secondary | ICD-10-CM | POA: Diagnosis not present

## 2018-03-22 DIAGNOSIS — N179 Acute kidney failure, unspecified: Secondary | ICD-10-CM | POA: Diagnosis not present

## 2018-03-22 NOTE — Progress Notes (Signed)
EPIC Encounter for ICM Monitoring  Patient Name: Eric Price is a 67 y.o. male Date: 03/22/2018 Primary Care Physican: [name], MD Primary Cardiologist:Berry Electrophysiologist:Croitoru Dry Weight:197lbs at home        Heart Failure questions reviewed, pt asymptomatic since hospital discharge.  He is feeling much better now.  Hospitalized 03/07/2018 to 03/14/2018 which correlates with patient being in hospital with c/o dizziness and fatigue.  Diuretics were held during hospitalization and received IV fluids   Thoracic impedance normal but was abnormal suggesting fluid accumulation starting 03/07/2018 and returned to baseline 03/21/2018.  Prescribed dosage: Furosemide80 mg take 1 tablet daily  Labs: 03/13/2018 Creatinine 1.03, BUN 9, Potassium 3.9, Sodium 142, EGFR >60 03/11/2018 Creatinine 1.00, BUN 11, Potassium 3.9, Sodium 140, EGFR >60  03/10/2018 Creatinine 1.16, BUN 14, Potassium 3.8, Sodium 141, EGFR >60  03/09/2018 Creatinine 1.33, BUN 19, Potassium 3.9, Sodium 138, EGFR 54->60  03/08/2018 Creatinine 1.96, BUN 27, Potassium 4.0, Sodium 134, EGFR 34-39 @ 3:49 AM  03/08/2018 Creatinine 1.96, BUN 27, Potassium 4.0, Sodium 134, EGFR 34-39 @ 1:15 AM 11/25/2017 Creatinine 1.10, BUN 23, Potassium 4.8, Sodium 138, EGFR 69-80 11/23/2017 Creatinine 1.19, BUN 29, Potassium 4.3, Sodium 140, EGFR 63-73  Recommendations: No changes.   Encouraged to call for fluid symptoms.  Follow-up plan: ICM clinic phone appointment on 03/31/2018 to check fluids levels post hospitalization.    Copy of ICM check sent to Dr. Sallyanne Kuster.   3 month ICM trend: 03/21/2018    1 Year ICM trend:       Rosalene Billings, RN 03/22/2018 10:31 AM

## 2018-03-23 DIAGNOSIS — I2510 Atherosclerotic heart disease of native coronary artery without angina pectoris: Secondary | ICD-10-CM | POA: Diagnosis not present

## 2018-03-23 DIAGNOSIS — I110 Hypertensive heart disease with heart failure: Secondary | ICD-10-CM | POA: Diagnosis not present

## 2018-03-23 DIAGNOSIS — C8400 Mycosis fungoides, unspecified site: Secondary | ICD-10-CM | POA: Diagnosis not present

## 2018-03-23 DIAGNOSIS — I5042 Chronic combined systolic (congestive) and diastolic (congestive) heart failure: Secondary | ICD-10-CM | POA: Diagnosis not present

## 2018-03-23 DIAGNOSIS — N179 Acute kidney failure, unspecified: Secondary | ICD-10-CM | POA: Diagnosis not present

## 2018-03-23 DIAGNOSIS — R6520 Severe sepsis without septic shock: Secondary | ICD-10-CM | POA: Diagnosis not present

## 2018-03-23 DIAGNOSIS — I428 Other cardiomyopathies: Secondary | ICD-10-CM | POA: Diagnosis not present

## 2018-03-23 DIAGNOSIS — A4181 Sepsis due to Enterococcus: Secondary | ICD-10-CM | POA: Diagnosis not present

## 2018-03-23 DIAGNOSIS — Z452 Encounter for adjustment and management of vascular access device: Secondary | ICD-10-CM | POA: Diagnosis not present

## 2018-03-23 DIAGNOSIS — T827XXA Infection and inflammatory reaction due to other cardiac and vascular devices, implants and grafts, initial encounter: Secondary | ICD-10-CM | POA: Diagnosis not present

## 2018-03-24 ENCOUNTER — Telehealth: Payer: Self-pay | Admitting: Family Medicine

## 2018-03-24 DIAGNOSIS — I428 Other cardiomyopathies: Secondary | ICD-10-CM | POA: Diagnosis not present

## 2018-03-24 DIAGNOSIS — Z452 Encounter for adjustment and management of vascular access device: Secondary | ICD-10-CM | POA: Diagnosis not present

## 2018-03-24 DIAGNOSIS — I5042 Chronic combined systolic (congestive) and diastolic (congestive) heart failure: Secondary | ICD-10-CM | POA: Diagnosis not present

## 2018-03-24 DIAGNOSIS — R6520 Severe sepsis without septic shock: Secondary | ICD-10-CM | POA: Diagnosis not present

## 2018-03-24 DIAGNOSIS — A4181 Sepsis due to Enterococcus: Secondary | ICD-10-CM | POA: Diagnosis not present

## 2018-03-24 DIAGNOSIS — C8400 Mycosis fungoides, unspecified site: Secondary | ICD-10-CM | POA: Diagnosis not present

## 2018-03-24 DIAGNOSIS — T827XXA Infection and inflammatory reaction due to other cardiac and vascular devices, implants and grafts, initial encounter: Secondary | ICD-10-CM | POA: Diagnosis not present

## 2018-03-24 DIAGNOSIS — I2510 Atherosclerotic heart disease of native coronary artery without angina pectoris: Secondary | ICD-10-CM | POA: Diagnosis not present

## 2018-03-24 DIAGNOSIS — N179 Acute kidney failure, unspecified: Secondary | ICD-10-CM | POA: Diagnosis not present

## 2018-03-24 DIAGNOSIS — I110 Hypertensive heart disease with heart failure: Secondary | ICD-10-CM | POA: Diagnosis not present

## 2018-03-24 NOTE — Telephone Encounter (Signed)
Copied from [place] [phone] [phone]. Topic: General - Other >> Mar 24, 2018  4:52 PM Valla Leaver wrote: Reason for CRM: Manuela Schwartz, OT with [facility] calling to request verbal orders to continue OT with frequency 1x a wk for 2wks.

## 2018-03-25 ENCOUNTER — Inpatient Hospital Stay: Payer: Medicare HMO | Admitting: Family Medicine

## 2018-03-25 NOTE — Telephone Encounter (Signed)
Verbal orders given  

## 2018-03-25 NOTE — Telephone Encounter (Signed)
ok 

## 2018-03-27 ENCOUNTER — Other Ambulatory Visit: Payer: Self-pay | Admitting: Family Medicine

## 2018-03-28 DIAGNOSIS — I428 Other cardiomyopathies: Secondary | ICD-10-CM | POA: Diagnosis not present

## 2018-03-28 DIAGNOSIS — C8400 Mycosis fungoides, unspecified site: Secondary | ICD-10-CM | POA: Diagnosis not present

## 2018-03-28 DIAGNOSIS — I110 Hypertensive heart disease with heart failure: Secondary | ICD-10-CM | POA: Diagnosis not present

## 2018-03-28 DIAGNOSIS — Z452 Encounter for adjustment and management of vascular access device: Secondary | ICD-10-CM | POA: Diagnosis not present

## 2018-03-28 DIAGNOSIS — I2510 Atherosclerotic heart disease of native coronary artery without angina pectoris: Secondary | ICD-10-CM | POA: Diagnosis not present

## 2018-03-28 DIAGNOSIS — N179 Acute kidney failure, unspecified: Secondary | ICD-10-CM | POA: Diagnosis not present

## 2018-03-28 DIAGNOSIS — T827XXA Infection and inflammatory reaction due to other cardiac and vascular devices, implants and grafts, initial encounter: Secondary | ICD-10-CM | POA: Diagnosis not present

## 2018-03-28 DIAGNOSIS — A4181 Sepsis due to Enterococcus: Secondary | ICD-10-CM | POA: Diagnosis not present

## 2018-03-28 DIAGNOSIS — I5042 Chronic combined systolic (congestive) and diastolic (congestive) heart failure: Secondary | ICD-10-CM | POA: Diagnosis not present

## 2018-03-28 DIAGNOSIS — R6520 Severe sepsis without septic shock: Secondary | ICD-10-CM | POA: Diagnosis not present

## 2018-03-29 DIAGNOSIS — I428 Other cardiomyopathies: Secondary | ICD-10-CM | POA: Diagnosis not present

## 2018-03-29 DIAGNOSIS — N179 Acute kidney failure, unspecified: Secondary | ICD-10-CM | POA: Diagnosis not present

## 2018-03-29 DIAGNOSIS — I5042 Chronic combined systolic (congestive) and diastolic (congestive) heart failure: Secondary | ICD-10-CM | POA: Diagnosis not present

## 2018-03-29 DIAGNOSIS — R6520 Severe sepsis without septic shock: Secondary | ICD-10-CM | POA: Diagnosis not present

## 2018-03-29 DIAGNOSIS — M549 Dorsalgia, unspecified: Secondary | ICD-10-CM | POA: Diagnosis not present

## 2018-03-29 DIAGNOSIS — Z452 Encounter for adjustment and management of vascular access device: Secondary | ICD-10-CM | POA: Diagnosis not present

## 2018-03-29 DIAGNOSIS — I110 Hypertensive heart disease with heart failure: Secondary | ICD-10-CM | POA: Diagnosis not present

## 2018-03-29 DIAGNOSIS — C8590 Non-Hodgkin lymphoma, unspecified, unspecified site: Secondary | ICD-10-CM | POA: Diagnosis not present

## 2018-03-29 DIAGNOSIS — A4181 Sepsis due to Enterococcus: Secondary | ICD-10-CM | POA: Diagnosis not present

## 2018-03-29 DIAGNOSIS — M47816 Spondylosis without myelopathy or radiculopathy, lumbar region: Secondary | ICD-10-CM | POA: Diagnosis not present

## 2018-03-29 DIAGNOSIS — I2510 Atherosclerotic heart disease of native coronary artery without angina pectoris: Secondary | ICD-10-CM | POA: Diagnosis not present

## 2018-03-29 DIAGNOSIS — Z950 Presence of cardiac pacemaker: Secondary | ICD-10-CM | POA: Diagnosis not present

## 2018-03-29 DIAGNOSIS — T827XXA Infection and inflammatory reaction due to other cardiac and vascular devices, implants and grafts, initial encounter: Secondary | ICD-10-CM | POA: Diagnosis not present

## 2018-03-29 DIAGNOSIS — C8400 Mycosis fungoides, unspecified site: Secondary | ICD-10-CM | POA: Diagnosis not present

## 2018-03-30 DIAGNOSIS — R69 Illness, unspecified: Secondary | ICD-10-CM | POA: Diagnosis not present

## 2018-03-30 DIAGNOSIS — C8405 Mycosis fungoides, lymph nodes of inguinal region and lower limb: Secondary | ICD-10-CM | POA: Diagnosis not present

## 2018-03-30 DIAGNOSIS — G629 Polyneuropathy, unspecified: Secondary | ICD-10-CM | POA: Diagnosis not present

## 2018-03-30 DIAGNOSIS — Z5112 Encounter for antineoplastic immunotherapy: Secondary | ICD-10-CM | POA: Diagnosis not present

## 2018-03-30 DIAGNOSIS — M79603 Pain in arm, unspecified: Secondary | ICD-10-CM | POA: Diagnosis not present

## 2018-03-30 DIAGNOSIS — M549 Dorsalgia, unspecified: Secondary | ICD-10-CM | POA: Diagnosis not present

## 2018-03-30 DIAGNOSIS — L299 Pruritus, unspecified: Secondary | ICD-10-CM | POA: Diagnosis not present

## 2018-03-30 DIAGNOSIS — C8400 Mycosis fungoides, unspecified site: Secondary | ICD-10-CM | POA: Diagnosis not present

## 2018-03-31 ENCOUNTER — Ambulatory Visit (INDEPENDENT_AMBULATORY_CARE_PROVIDER_SITE_OTHER): Payer: Medicare HMO

## 2018-03-31 DIAGNOSIS — I2510 Atherosclerotic heart disease of native coronary artery without angina pectoris: Secondary | ICD-10-CM | POA: Diagnosis not present

## 2018-03-31 DIAGNOSIS — R6520 Severe sepsis without septic shock: Secondary | ICD-10-CM | POA: Diagnosis not present

## 2018-03-31 DIAGNOSIS — C8400 Mycosis fungoides, unspecified site: Secondary | ICD-10-CM | POA: Diagnosis not present

## 2018-03-31 DIAGNOSIS — I110 Hypertensive heart disease with heart failure: Secondary | ICD-10-CM | POA: Diagnosis not present

## 2018-03-31 DIAGNOSIS — A4181 Sepsis due to Enterococcus: Secondary | ICD-10-CM | POA: Diagnosis not present

## 2018-03-31 DIAGNOSIS — I428 Other cardiomyopathies: Secondary | ICD-10-CM | POA: Diagnosis not present

## 2018-03-31 DIAGNOSIS — I5042 Chronic combined systolic (congestive) and diastolic (congestive) heart failure: Secondary | ICD-10-CM

## 2018-03-31 DIAGNOSIS — Z95810 Presence of automatic (implantable) cardiac defibrillator: Secondary | ICD-10-CM

## 2018-03-31 DIAGNOSIS — T827XXA Infection and inflammatory reaction due to other cardiac and vascular devices, implants and grafts, initial encounter: Secondary | ICD-10-CM | POA: Diagnosis not present

## 2018-03-31 DIAGNOSIS — N179 Acute kidney failure, unspecified: Secondary | ICD-10-CM | POA: Diagnosis not present

## 2018-03-31 DIAGNOSIS — Z452 Encounter for adjustment and management of vascular access device: Secondary | ICD-10-CM | POA: Diagnosis not present

## 2018-03-31 NOTE — Progress Notes (Signed)
EPIC Encounter for ICM Monitoring  Patient Name: Eric Price is a 67 y.o. male Date: 03/31/2018 Primary Care Physican: [name], MD Primary Cardiologist:Berry Electrophysiologist:Croitoru Dry Weight:195lbs at home  Time in AF 0.2 hr/day (0.7%)    Heart Failure questions reviewed, pt asymptomatic.  Hospitalized 03/07/2018 to 03/14/2018    Thoracic impedance returned to normal.  Prescribed dosage: Furosemide80 mg take 1 tablet daily  Labs: 03/13/2018 Creatinine 1.03, BUN 9,   Potassium 3.9, Sodium 142, EGFR >60 03/11/2018 Creatinine 1.00, BUN 11, Potassium 3.9, Sodium 140, EGFR >60  03/10/2018 Creatinine 1.16, BUN 14, Potassium 3.8, Sodium 141, EGFR >60  03/09/2018 Creatinine 1.33, BUN 19, Potassium 3.9, Sodium 138, EGFR 54->60  03/08/2018 Creatinine 1.96, BUN 27, Potassium 4.0, Sodium 134, EGFR 34-39 @ 3:49 AM  03/08/2018 Creatinine 1.96, BUN 27, Potassium 4.0, Sodium 134, EGFR 34-39 @ 1:15 AM 11/25/2017 Creatinine 1.10, BUN 23, Potassium 4.8, Sodium 138, EGFR 69-80 11/23/2017 Creatinine 1.19, BUN 29, Potassium 4.3, Sodium 140, EGFR 63-73  Recommendations: No changes.   Encouraged to call for fluid symptoms.  Follow-up plan: ICM clinic phone appointment on 04/21/2018.    Copy of ICM check sent to Dr. Sallyanne Kuster.   3 month ICM trend: 03/31/2018    1 Year ICM trend:             Rosalene Billings, RN 03/31/2018 2:03 PM

## 2018-04-01 ENCOUNTER — Telehealth: Payer: Self-pay | Admitting: Family Medicine

## 2018-04-01 ENCOUNTER — Encounter: Payer: Self-pay | Admitting: Family Medicine

## 2018-04-01 ENCOUNTER — Ambulatory Visit (INDEPENDENT_AMBULATORY_CARE_PROVIDER_SITE_OTHER): Payer: Medicare HMO | Admitting: Family Medicine

## 2018-04-01 VITALS — BP 102/80 | HR 96 | Temp 98.3°F | Wt 195.9 lb

## 2018-04-01 DIAGNOSIS — I5042 Chronic combined systolic (congestive) and diastolic (congestive) heart failure: Secondary | ICD-10-CM

## 2018-04-01 DIAGNOSIS — I739 Peripheral vascular disease, unspecified: Secondary | ICD-10-CM

## 2018-04-01 DIAGNOSIS — N179 Acute kidney failure, unspecified: Secondary | ICD-10-CM

## 2018-04-01 DIAGNOSIS — C8400 Mycosis fungoides, unspecified site: Secondary | ICD-10-CM | POA: Diagnosis not present

## 2018-04-01 DIAGNOSIS — I10 Essential (primary) hypertension: Secondary | ICD-10-CM

## 2018-04-01 LAB — BASIC METABOLIC PANEL
BUN: 25 mg/dL — AB (ref 6–23)
CALCIUM: 9.9 mg/dL (ref 8.4–10.5)
CO2: 30 meq/L (ref 19–32)
Chloride: 99 mEq/L (ref 96–112)
Creatinine, Ser: 1.49 mg/dL (ref 0.40–1.50)
GFR: 60.36 mL/min (ref >60.00)
GLUCOSE: 99 mg/dL (ref 70–99)
Potassium: 4.3 mEq/L (ref 3.5–5.1)
SODIUM: 140 meq/L (ref 135–145)

## 2018-04-01 NOTE — Telephone Encounter (Signed)
OK 

## 2018-04-01 NOTE — Progress Notes (Signed)
Thank you. Have a good weekend MCr

## 2018-04-01 NOTE — Telephone Encounter (Signed)
Left message on machine with verbal orders. 

## 2018-04-01 NOTE — Telephone Encounter (Signed)
Copied from [place] [id number]. Topic: General - Other >> Apr 01, 2018  2:44 PM Yvette Rack wrote: Reason for CRM: Jerilynn with [facility] requests verbal orders for physical therapy 2 times a week for 3 weeks starting week of 04/11/18. Cb# [phone]
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
## 2018-04-06 DIAGNOSIS — A4181 Sepsis due to Enterococcus: Secondary | ICD-10-CM | POA: Diagnosis not present

## 2018-04-06 DIAGNOSIS — R6520 Severe sepsis without septic shock: Secondary | ICD-10-CM | POA: Diagnosis not present

## 2018-04-06 DIAGNOSIS — I2510 Atherosclerotic heart disease of native coronary artery without angina pectoris: Secondary | ICD-10-CM | POA: Diagnosis not present

## 2018-04-06 DIAGNOSIS — C8400 Mycosis fungoides, unspecified site: Secondary | ICD-10-CM | POA: Diagnosis not present

## 2018-04-06 DIAGNOSIS — N179 Acute kidney failure, unspecified: Secondary | ICD-10-CM | POA: Diagnosis not present

## 2018-04-06 DIAGNOSIS — I428 Other cardiomyopathies: Secondary | ICD-10-CM | POA: Diagnosis not present

## 2018-04-06 DIAGNOSIS — I5042 Chronic combined systolic (congestive) and diastolic (congestive) heart failure: Secondary | ICD-10-CM | POA: Diagnosis not present

## 2018-04-06 DIAGNOSIS — I110 Hypertensive heart disease with heart failure: Secondary | ICD-10-CM | POA: Diagnosis not present

## 2018-04-06 DIAGNOSIS — Z452 Encounter for adjustment and management of vascular access device: Secondary | ICD-10-CM | POA: Diagnosis not present

## 2018-04-06 DIAGNOSIS — T827XXA Infection and inflammatory reaction due to other cardiac and vascular devices, implants and grafts, initial encounter: Secondary | ICD-10-CM | POA: Diagnosis not present

## 2018-04-12 DIAGNOSIS — T827XXA Infection and inflammatory reaction due to other cardiac and vascular devices, implants and grafts, initial encounter: Secondary | ICD-10-CM | POA: Diagnosis not present

## 2018-04-12 DIAGNOSIS — I428 Other cardiomyopathies: Secondary | ICD-10-CM | POA: Diagnosis not present

## 2018-04-12 DIAGNOSIS — I5042 Chronic combined systolic (congestive) and diastolic (congestive) heart failure: Secondary | ICD-10-CM | POA: Diagnosis not present

## 2018-04-12 DIAGNOSIS — A4181 Sepsis due to Enterococcus: Secondary | ICD-10-CM | POA: Diagnosis not present

## 2018-04-12 DIAGNOSIS — I110 Hypertensive heart disease with heart failure: Secondary | ICD-10-CM | POA: Diagnosis not present

## 2018-04-12 DIAGNOSIS — R6520 Severe sepsis without septic shock: Secondary | ICD-10-CM | POA: Diagnosis not present

## 2018-04-12 DIAGNOSIS — I2510 Atherosclerotic heart disease of native coronary artery without angina pectoris: Secondary | ICD-10-CM | POA: Diagnosis not present

## 2018-04-12 DIAGNOSIS — N179 Acute kidney failure, unspecified: Secondary | ICD-10-CM | POA: Diagnosis not present

## 2018-04-12 DIAGNOSIS — C8400 Mycosis fungoides, unspecified site: Secondary | ICD-10-CM | POA: Diagnosis not present

## 2018-04-12 DIAGNOSIS — Z452 Encounter for adjustment and management of vascular access device: Secondary | ICD-10-CM | POA: Diagnosis not present

## 2018-04-13 ENCOUNTER — Telehealth: Payer: Self-pay | Admitting: Family Medicine

## 2018-04-13 DIAGNOSIS — C8400 Mycosis fungoides, unspecified site: Secondary | ICD-10-CM | POA: Diagnosis not present

## 2018-04-13 DIAGNOSIS — Z5112 Encounter for antineoplastic immunotherapy: Secondary | ICD-10-CM | POA: Diagnosis not present

## 2018-04-13 MED ORDER — GLUCAGON (RDNA) 1 MG IJ KIT: 1.0000 mg | PACK | Freq: Once | INTRAMUSCULAR | 3 refills | Status: DC | PRN

## 2018-04-13 MED ORDER — ATORVASTATIN CALCIUM 20 MG PO TABS: ORAL_TABLET | ORAL | 0 refills | Status: DC

## 2018-04-13 NOTE — Telephone Encounter (Signed)
Copied from [place] [id number]. Topic: Quick Communication - See Telephone Encounter >> Apr 13, 2018  4:51 PM Gardiner Ramus wrote: CRM for notification. See Telephone encounter for: 04/13/18. Rachael from [facility] called and stated that pt reported sugar from 397 and then dropped to 36. Pt concerned that it was up and down.  if you would like to ask her any questions please call CB# [phone]

## 2018-04-13 NOTE — Telephone Encounter (Signed)
1. Patient understands the directions for furosemide 2. Rx for glucagon sent 3.  Patient will discuss Rx for amitriptyline.  Rx for atorvastatin sent.  Patient will call Dr Arman Filter office for further assistance with diabetic concerns.

## 2018-04-13 NOTE — Telephone Encounter (Signed)
Called patient to get clarification of what exactly is needed - message sent to office does not make sense  (1) Pt is asking what the name of the medication was that he was told to decrease dose of last OV. Pt states that he was told to decrease to 1/2 tablet x 3-4 days then go back to full dose once results of labs came back.  **Notes recorded by [name], MD on 04/03/2018 at 10:55 AM EDT Renal function has worsened slightly-?mild dehydration.  Have him reduce furosemide back to 40 mg daily for three days and then resume regular dose. Make sure he gets 2 weeks follow up to reassess.  Scheduled patient for OV with Dr Elease Hashimoto on 04/18/18 at 1:45 --------  (2) Requesting a diabetic rescue kit - hypoglycemia protocol - glucogon emergency kit  (3) Needing refills of Amitriptyline 64m and Atorvastatin 274m  *Amitriptyline not on current med list -- last rx'd by [facility] (Hospitalist) on 11/12/2016

## 2018-04-13 NOTE — Telephone Encounter (Signed)
Copied from [place] [id number]. Topic: Quick Communication - See Telephone Encounter >> Apr 13, 2018  2:42 PM Neva Seat wrote: The name of the drug of the medication the doctor wants him to take half of. Needing diabetic rescue kit. Amotripline   aline

## 2018-04-14 DIAGNOSIS — I428 Other cardiomyopathies: Secondary | ICD-10-CM | POA: Diagnosis not present

## 2018-04-14 DIAGNOSIS — I110 Hypertensive heart disease with heart failure: Secondary | ICD-10-CM | POA: Diagnosis not present

## 2018-04-14 DIAGNOSIS — T827XXA Infection and inflammatory reaction due to other cardiac and vascular devices, implants and grafts, initial encounter: Secondary | ICD-10-CM | POA: Diagnosis not present

## 2018-04-14 DIAGNOSIS — C8400 Mycosis fungoides, unspecified site: Secondary | ICD-10-CM | POA: Diagnosis not present

## 2018-04-14 DIAGNOSIS — I5042 Chronic combined systolic (congestive) and diastolic (congestive) heart failure: Secondary | ICD-10-CM | POA: Diagnosis not present

## 2018-04-14 DIAGNOSIS — R6520 Severe sepsis without septic shock: Secondary | ICD-10-CM | POA: Diagnosis not present

## 2018-04-14 DIAGNOSIS — I2510 Atherosclerotic heart disease of native coronary artery without angina pectoris: Secondary | ICD-10-CM | POA: Diagnosis not present

## 2018-04-14 DIAGNOSIS — Z452 Encounter for adjustment and management of vascular access device: Secondary | ICD-10-CM | POA: Diagnosis not present

## 2018-04-14 DIAGNOSIS — N179 Acute kidney failure, unspecified: Secondary | ICD-10-CM | POA: Diagnosis not present

## 2018-04-14 DIAGNOSIS — A4181 Sepsis due to Enterococcus: Secondary | ICD-10-CM | POA: Diagnosis not present

## 2018-04-15 ENCOUNTER — Telehealth: Payer: Self-pay | Admitting: Internal Medicine

## 2018-04-15 NOTE — Telephone Encounter (Signed)
Went over with wife the doses and she stated his sugars are bottoming out, she would like to know if he can be worked in sooner? Please advise

## 2018-04-15 NOTE — Telephone Encounter (Signed)
Appt moved to Tuesday

## 2018-04-15 NOTE — Telephone Encounter (Signed)
Can he come Tuesday at 2:15?  Per my records, he is on: - Levemir 50 units 2x a day - NovoLog 34-40 units 2x a day. - Trulicity 1.5 mg weekly in am. - Metformin 1000 mg 2x a day with meals. Until he is seen, he may need to reduce the Levemir to 40 units twice a day and the NovoLog to at most 30 units twice a day.

## 2018-04-15 NOTE — Telephone Encounter (Signed)
Patient's wife [name] ph# [phone] called. Patient's blood sugar and medication dosage. There is confusion as to how to use the Levemire and Novalog. Please call her at the above number to advise asap.

## 2018-04-18 ENCOUNTER — Ambulatory Visit (INDEPENDENT_AMBULATORY_CARE_PROVIDER_SITE_OTHER): Payer: Medicare HMO | Admitting: Family Medicine

## 2018-04-18 ENCOUNTER — Encounter: Payer: Self-pay | Admitting: Family Medicine

## 2018-04-18 VITALS — BP 90/60 | HR 99 | Temp 98.2°F | Ht 69.0 in | Wt 207.3 lb

## 2018-04-18 DIAGNOSIS — N289 Disorder of kidney and ureter, unspecified: Secondary | ICD-10-CM

## 2018-04-18 DIAGNOSIS — G609 Hereditary and idiopathic neuropathy, unspecified: Secondary | ICD-10-CM | POA: Diagnosis not present

## 2018-04-18 DIAGNOSIS — IMO0002 Reserved for concepts with insufficient information to code with codable children: Secondary | ICD-10-CM

## 2018-04-18 DIAGNOSIS — E1142 Type 2 diabetes mellitus with diabetic polyneuropathy: Secondary | ICD-10-CM | POA: Diagnosis not present

## 2018-04-18 DIAGNOSIS — E1165 Type 2 diabetes mellitus with hyperglycemia: Secondary | ICD-10-CM

## 2018-04-18 LAB — BASIC METABOLIC PANEL
BUN: 15 mg/dL (ref 6–23)
CALCIUM: 9.6 mg/dL (ref 8.4–10.5)
CHLORIDE: 104 meq/L (ref 96–112)
CO2: 27 mEq/L (ref 19–32)
CREATININE: 1.22 mg/dL (ref 0.40–1.50)
GFR: 76.01 mL/min (ref >60.00)
Glucose, Bld: 66 mg/dL — ABNORMAL LOW (ref 70–99)
Potassium: 4.4 mEq/L (ref 3.5–5.1)
Sodium: 139 mEq/L (ref 135–145)

## 2018-04-18 MED ORDER — PREGABALIN 100 MG PO CAPS: 100.0000 mg | ORAL_CAPSULE | Freq: Two times a day (BID) | ORAL | 5 refills | Status: DC

## 2018-04-18 NOTE — Patient Instructions (Signed)
Go ahead and increase the Lyrica up to 100 mg twice daily.
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
## 2018-04-19 ENCOUNTER — Encounter: Payer: Self-pay | Admitting: *Deleted

## 2018-04-19 ENCOUNTER — Other Ambulatory Visit: Payer: Self-pay | Admitting: *Deleted

## 2018-04-19 ENCOUNTER — Encounter: Payer: Self-pay | Admitting: Internal Medicine

## 2018-04-19 ENCOUNTER — Other Ambulatory Visit (INDEPENDENT_AMBULATORY_CARE_PROVIDER_SITE_OTHER): Payer: Medicare HMO

## 2018-04-19 ENCOUNTER — Ambulatory Visit (INDEPENDENT_AMBULATORY_CARE_PROVIDER_SITE_OTHER): Payer: Medicare HMO | Admitting: Internal Medicine

## 2018-04-19 VITALS — BP 110/76 | HR 97 | Ht 69.0 in | Wt 207.4 lb

## 2018-04-19 DIAGNOSIS — E1165 Type 2 diabetes mellitus with hyperglycemia: Secondary | ICD-10-CM | POA: Diagnosis not present

## 2018-04-19 DIAGNOSIS — E1159 Type 2 diabetes mellitus with other circulatory complications: Secondary | ICD-10-CM

## 2018-04-19 LAB — LIPID PANEL
CHOL/HDL RATIO: 6
Cholesterol: 186 mg/dL (ref 0–200)
HDL: 28.9 mg/dL — ABNORMAL LOW (ref >39.00)
Triglycerides: 542 mg/dL — ABNORMAL HIGH (ref 0.0–149.0)

## 2018-04-19 LAB — LDL CHOLESTEROL, DIRECT: LDL DIRECT: 78 mg/dL

## 2018-04-19 NOTE — Patient Outreach (Signed)
[facility]) Care Management  04/19/2018   Eric Price [birth year] [phone] RN Health Coach telephone call to patient.  Hipaa compliance verified. Per patient he had just left the endocrinologist. Per patient his A1C is still 6.3. Patient stated he received the educational information on diabetes. Patient is checking blood sugar as per dr order. Patient and wife voiced understanding of information received.   Current Medications:  Current Outpatient Medications  Medication Sig Dispense Refill  . albuterol (PROVENTIL HFA;VENTOLIN HFA) 108 (90 Base) MCG/ACT inhaler Inhale 2 puffs into the lungs every 4 (four) hours as needed for wheezing or shortness of breath. 1 Inhaler 2  . apixaban (ELIQUIS) 5 MG TABS tablet Take 1 tablet (5 mg total) by mouth 2 (two) times daily. 60 tablet 5  . atorvastatin (LIPITOR) 20 MG tablet TAKE 1 TABLET BY MOUTH EVERY DAY AT 6 90 tablet 0  . buPROPion (WELLBUTRIN XL) 150 MG 24 hr tablet TAKE 1 TABLET(150 MG) BY MOUTH DAILY 90 tablet 1  . carvedilol (COREG) 25 MG tablet Take 1 tablet (25 mg total) by mouth 2 (two) times daily with a meal. 60 tablet 3  . clobetasol cream (TEMOVATE) 7.49 % APPLY 1 APPLICATION TOPICALLY TWICE DAILY 30 g 2  . DOCQLACE 100 MG capsule TAKE 1 CAPSULE BY MOUTH DAILY (Patient taking differently: TAKE 1 CAPSULE BY MOUTH DAILY AS NEEDED) 30 capsule 0  . Dulaglutide (TRULICITY) 1.5 SW/9.6PR SOPN INJECT CONTENTS OF ONE PEN UNDER THE SKIN ONCE A WEEK 12 pen 3  . feeding supplement, ENSURE ENLIVE, (ENSURE ENLIVE) LIQD Take 237 mLs by mouth 2 (two) times daily between meals. 916 mL 12  . folic acid (FOLVITE) 1 MG tablet Take 1 tablet (1 mg total) by mouth daily.    . furosemide (LASIX) 80 MG tablet Take 1 tablet (80 mg total) by mouth daily. (Patient taking differently: Take 40 mg by mouth daily. ) 90 tablet 3  . glucagon 1 MG injection Inject 1 mg into the vein once as needed (for high blood sugar). 1 each 3  . glucose blood  (ONETOUCH VERIO) test strip USE TO CHECK BLOOD SUGAR TWICE DAILY 200 each 3  . hydrOXYzine (ATARAX/VISTARIL) 25 MG tablet TAKE 1- 3 TABLETS BY MOUTH TWICE DAILY AS NEEDED FOR ITCHING 180 tablet 0  . insulin aspart (NOVOLOG FLEXPEN) 100 UNIT/ML FlexPen Inject 34-40 units before meals, 3x a day (Patient taking differently: Inject 34-40 Units into the skin 3 (three) times daily with meals. Sliding scale) 30 mL 5  . Insulin Detemir (LEVEMIR FLEXTOUCH) 100 UNIT/ML Pen ADMINISTER 50 UNITS UNDER THE SKIN TWICE DAILY 30 mL 0  . lisinopril (PRINIVIL,ZESTRIL) 2.5 MG tablet Take 2 tablets (5 mg total) by mouth daily. 180 tablet 3  . metFORMIN (GLUCOPHAGE) 1000 MG tablet Take 1 tablet (1,000 mg total) by mouth 2 (two) times daily with a meal. 180 tablet 3  . nitroGLYCERIN (NITROSTAT) 0.4 MG SL tablet Place 1 tablet (0.4 mg total) under the tongue every 5 (five) minutes as needed for chest pain. 60 tablet 12  . ONE TOUCH LANCETS MISC Check 2 times daily. 100 each 3  . pantoprazole (PROTONIX) 40 MG tablet Take 1 tablet (40 mg total) by mouth daily. 90 tablet 3  . pregabalin (LYRICA) 100 MG capsule Take 1 capsule (100 mg total) by mouth 2 (two) times daily. 60 capsule 5  . promethazine (PHENERGAN) 12.5 MG tablet Take 12.5 mg by mouth every 6 (six) hours as needed for nausea or  vomiting.    Marland Kitchen spironolactone (ALDACTONE) 25 MG tablet Take 1 tablet (25 mg total) by mouth daily. 90 tablet 3  . tiZANidine (ZANAFLEX) 2 MG tablet TAKE 1 TABLET(2 MG) BY MOUTH AT BEDTIME AS NEEDED FOR MUSCLE SPASMS 90 tablet 0  . traMADol (ULTRAM) 50 MG tablet TAKE 1 TABLET(50 MG) BY MOUTH EVERY 6 HOURS AS NEEDED FOR PAIN    . triamcinolone cream (KENALOG) 0.1 % Apply 1 application topically 2 (two) times daily.      No current facility-administered medications for this visit.     Functional Status:  In your present state of health, do you have any difficulty performing the following activities: 03/21/2018 03/08/2018  Hearing? N N   Vision? N N  Difficulty concentrating or making decisions? Eric Price  Walking or climbing stairs? Y Y  Dressing or bathing? Y Y  Doing errands, shopping? Eric Price  Preparing Food and eating ? Y -  Using the Toilet? N -  In the past six months, have you accidently leaked urine? Y -  Do you have problems with loss of bowel control? N -  Managing your Medications? N -  Managing your Finances? N -  Housekeeping or managing your Housekeeping? Y -  Some recent data might be hidden    Fall/Depression Screening: Fall Risk  04/19/2018 03/21/2018 07/01/2016  Falls in the past year? Yes Yes Yes  Number falls in past yr: _0 Injury with Fall? - - Yes  Risk Factor Category  - - High Fall Risk  Risk for fall due to : History of fall(s);Impaired balance/gait;Impaired mobility History of fall(s);Impaired balance/gait;Impaired mobility -  Follow up Falls evaluation completed;Falls prevention discussed Falls evaluation completed;Education provided;Falls prevention discussed Falls evaluation completed;Education provided   [facility] 2/9 Scores 04/19/2018 03/21/2018 07/01/2016 05/24/2015 04/22/2015  PHQ - 2 Score 0 0 0 2 3  PHQ- 9 Score - - - - 14   THN CM Care Plan Problem One     Most Recent Value  Care Plan Problem One  Knowledge Deficit in Self Management of Diabetes  Role Documenting the Problem One  Eric Price for Problem One  Not Active  THN Long Term Goal Met Date  04/19/18  [facility] CM Short Term Goal #1   Patient and wife will have a better understanding of healthy snacks within the next 30 days  THN CM Short Term Goal #1 Met Date  04/19/18  [facility] CM Short Term Goal #2   Patient and wife will have a better understanding of selctions to make from dinning out within the next 30 days  THN CM Short Term Goal #2 Met Date  04/19/18  [facility] CM Short Term Goal #3 Met Date  04/19/18      Assessment:  RN discussed A1C Patient and wife verbalized having a  better understanding of his diabetes Patient goal has  been met of maintaining A1C at 6.3  Plan: RN will close case at this time RN will notify PCP of closure  [place] [facility] [phone]

## 2018-04-19 NOTE — Patient Outreach (Signed)
[facility]) Care Management  04/19/2018  Eric Price 02-11-1951 [id number]   RN Health Coach telephone call to patient.  Hipaa compliance verified. Per patient he is in the Dr office. Patient requested for [place] to call back later.  Plan:  RN Health Coach will call patient back later today  [place] [facility] [phone]

## 2018-04-19 NOTE — Patient Instructions (Addendum)
Please continue: - Trulicity 1.5 mg weekly in am. - Metformin 1000 mg 2x a day with meals - Levemir 50 units 2x a day  - Novolog 34 (-40) units 3x a day before meals  Please always take Novolog 10-15 min before a meal.   Please come back for a follow-up appointment in 3 months.
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
## 2018-04-20 DIAGNOSIS — A4181 Sepsis due to Enterococcus: Secondary | ICD-10-CM | POA: Diagnosis not present

## 2018-04-20 DIAGNOSIS — Z452 Encounter for adjustment and management of vascular access device: Secondary | ICD-10-CM | POA: Diagnosis not present

## 2018-04-20 DIAGNOSIS — R6520 Severe sepsis without septic shock: Secondary | ICD-10-CM | POA: Diagnosis not present

## 2018-04-20 DIAGNOSIS — N179 Acute kidney failure, unspecified: Secondary | ICD-10-CM | POA: Diagnosis not present

## 2018-04-20 DIAGNOSIS — I2510 Atherosclerotic heart disease of native coronary artery without angina pectoris: Secondary | ICD-10-CM | POA: Diagnosis not present

## 2018-04-20 DIAGNOSIS — T827XXA Infection and inflammatory reaction due to other cardiac and vascular devices, implants and grafts, initial encounter: Secondary | ICD-10-CM | POA: Diagnosis not present

## 2018-04-20 DIAGNOSIS — I110 Hypertensive heart disease with heart failure: Secondary | ICD-10-CM | POA: Diagnosis not present

## 2018-04-20 DIAGNOSIS — I428 Other cardiomyopathies: Secondary | ICD-10-CM | POA: Diagnosis not present

## 2018-04-20 DIAGNOSIS — I5042 Chronic combined systolic (congestive) and diastolic (congestive) heart failure: Secondary | ICD-10-CM | POA: Diagnosis not present

## 2018-04-20 DIAGNOSIS — C8400 Mycosis fungoides, unspecified site: Secondary | ICD-10-CM | POA: Diagnosis not present

## 2018-04-20 MED ORDER — FENOFIBRATE 145 MG PO TABS: 145.0000 mg | ORAL_TABLET | Freq: Every day | ORAL | 3 refills | Status: DC

## 2018-04-21 ENCOUNTER — Ambulatory Visit (INDEPENDENT_AMBULATORY_CARE_PROVIDER_SITE_OTHER): Payer: Medicare HMO

## 2018-04-21 DIAGNOSIS — Z452 Encounter for adjustment and management of vascular access device: Secondary | ICD-10-CM | POA: Diagnosis not present

## 2018-04-21 DIAGNOSIS — Z95810 Presence of automatic (implantable) cardiac defibrillator: Secondary | ICD-10-CM

## 2018-04-21 DIAGNOSIS — C8400 Mycosis fungoides, unspecified site: Secondary | ICD-10-CM | POA: Diagnosis not present

## 2018-04-21 DIAGNOSIS — I2510 Atherosclerotic heart disease of native coronary artery without angina pectoris: Secondary | ICD-10-CM | POA: Diagnosis not present

## 2018-04-21 DIAGNOSIS — I428 Other cardiomyopathies: Secondary | ICD-10-CM | POA: Diagnosis not present

## 2018-04-21 DIAGNOSIS — N179 Acute kidney failure, unspecified: Secondary | ICD-10-CM | POA: Diagnosis not present

## 2018-04-21 DIAGNOSIS — I5042 Chronic combined systolic (congestive) and diastolic (congestive) heart failure: Secondary | ICD-10-CM | POA: Diagnosis not present

## 2018-04-21 DIAGNOSIS — R6520 Severe sepsis without septic shock: Secondary | ICD-10-CM | POA: Diagnosis not present

## 2018-04-21 DIAGNOSIS — I110 Hypertensive heart disease with heart failure: Secondary | ICD-10-CM | POA: Diagnosis not present

## 2018-04-21 DIAGNOSIS — T827XXA Infection and inflammatory reaction due to other cardiac and vascular devices, implants and grafts, initial encounter: Secondary | ICD-10-CM | POA: Diagnosis not present

## 2018-04-21 DIAGNOSIS — A4181 Sepsis due to Enterococcus: Secondary | ICD-10-CM | POA: Diagnosis not present

## 2018-04-21 NOTE — Progress Notes (Signed)
Agree, thanks MCr 

## 2018-04-21 NOTE — Progress Notes (Signed)
EPIC Encounter for ICM Monitoring  Patient Name: Eric Price is a 67 y.o. male Date: 04/21/2018 Primary Care Physican: [name], MD Primary Cardiologist:Berry Electrophysiologist:Croitoru Dry Weight:207 lbs      Heart Failure questions reviewed, pt symptomatic with 5 lb weight gain from 197 to 202 lbs in the last week.   Dr Elease Hashimoto decresaed Furosemide from 80 mg to 40 mg daily x 3 days on 6/30 due to lab results and then resume prescribed dosage.  Patient said he just resumed the 80 mg dosage today.    Thoracic impedance close to baseline.  Decreased impedance correlates with decreased Furosemide dosage.  Prescribed dosage: Furosemide80 mg take 1 tablet daily  Labs: 04/18/2018 Creatinine 1.22, BUN 15,  Potassium 4.4, Sodium 139, EGFR 76.01 04/01/2018 Creatinine 1.49. BIN 25,   Potassium 4.3, Sodium 140, EGFR 6036 03/13/2018 Creatinine1.03, BUN9,   Potassium3.9, UOHFGB021, EGFR>60 03/11/2018 Creatinine1.00, BUN11, Potassium3.9, Sodium140, EGFR>60  03/10/2018 Creatinine1.16, BUN14, Potassium3.8, Sodium141, EGFR>60  03/09/2018 Creatinine1.33, BUN19, Potassium3.9, Sodium138, EGFR54->60  03/08/2018 Creatinine1.96, BUN27, Potassium4.0, Sodium134, JDBZ20-80 @ 3:49 AM  03/08/2018 Creatinine1.96, BUN27, Potassium4.0, EMVVKP224, SLPN30-05 @ 1:15 AM 11/25/2017 Creatinine 1.10, BUN 23, Potassium 4.8, Sodium 138, EGFR 69-80 11/23/2017 Creatinine 1.19, BUN 29, Potassium 4.3, Sodium 140, EGFR 63-73  Recommendations:  Med list says Furosemide is prescribed 80 mg daily but then reads taking differently, 40 mg daily.   Patient said he has resumed 80 mg today after it being decreased in response to labs drawn on 6/28.   Follow-up plan: ICM clinic phone appointment on 04/25/2018 (manual send) to recheck fluid levels.     Copy of ICM check sent to Dr. Sallyanne Kuster and Dr Gwenlyn Found for review.   3 month ICM trend: 04/21/2018    1 Year ICM trend:       Rosalene Billings, RN 04/21/2018 12:40 PM

## 2018-04-22 ENCOUNTER — Telehealth: Payer: Self-pay | Admitting: Emergency Medicine

## 2018-04-22 NOTE — Telephone Encounter (Signed)
  Just saw him 3 days ago.  No need to schedule another appointment for now.  Please continue: - Trulicity 1.5 mg weekly in am. - Metformin 1000 mg 2x a day with meals - Levemir 50 units 2x a day >> decrease to 40 units twice a day - Novolog 34 (-40) units 3x a day before meals >> decrease to 26-30 units 3 times a day before meals

## 2018-04-22 NOTE — Telephone Encounter (Signed)
Pt is aware will call back next week with update

## 2018-04-22 NOTE — Telephone Encounter (Signed)
Pt called and stated his blood sugars keep dropping up and down. He is wondering what he needs to do and if he needs to make an appt. Please give the patient a call back thanks.

## 2018-04-22 NOTE — Telephone Encounter (Signed)
3pm- 90 12pm-208 730am-136  Yesterday his sugar dropped to 48 in the afternoon and waited an hour and got it to 77. When he went to bed it was 121.  Pts sugars have been all over the place and he has not made any changes. Please advise.

## 2018-04-25 ENCOUNTER — Ambulatory Visit (INDEPENDENT_AMBULATORY_CARE_PROVIDER_SITE_OTHER): Payer: Medicare HMO

## 2018-04-25 DIAGNOSIS — Z95810 Presence of automatic (implantable) cardiac defibrillator: Secondary | ICD-10-CM

## 2018-04-25 DIAGNOSIS — I5042 Chronic combined systolic (congestive) and diastolic (congestive) heart failure: Secondary | ICD-10-CM

## 2018-04-25 NOTE — Progress Notes (Signed)
EPIC Encounter for ICM Monitoring  Patient Name: Eric Price is a 67 y.o. male Date: 04/25/2018 Primary Care Physican: [name], MD Primary Cardiologist:Berry Electrophysiologist:Croitoru Dry Weight:Previous weight 207 lbs       Attempted call to patient and unable to reach.  Left detailed message, per DPR, regarding transmission.  Transmission reviewed.    Thoracic impedance returned to normal after resuming Furosemide prescribed dosage of 80 mg daily.  Prescribed dosage: Furosemide80 mg take 1 tablet daily  Labs: 04/18/2018 Creatinine 1.22, BUN 15,  Potassium 4.4, Sodium 139, EGFR 76.01 04/01/2018 Creatinine 1.49. BIN 25,   Potassium 4.3, Sodium 140, EGFR 6036 03/13/2018 Creatinine1.03, BUN9, Potassium3.9, [id number], EGFR>60 03/11/2018 Creatinine1.00, BUN11, Potassium3.9, Sodium140, EGFR>60  03/10/2018 Creatinine1.16, BUN14, Potassium3.8, Sodium141, EGFR>60  03/09/2018 Creatinine1.33, BUN19, Potassium3.9, Sodium138, EGFR54->60  03/08/2018 Creatinine1.96, BUN27, Potassium4.0, Sodium134, UGGP66-19 @ 3:49 AM  03/08/2018 Creatinine1.96, BUN27, Potassium4.0, ELGKBO286, LTVT82-42 @ 1:15 AM 11/25/2017 Creatinine 1.10, BUN 23, Potassium 4.8, Sodium 138, EGFR 69-80 11/23/2017 Creatinine 1.19, BUN 29, Potassium 4.3, Sodium 140, EGFR 63-73  Recommendations: Left voice mail with ICM number and encouraged to call if experiencing any fluid symptoms.  Follow-up plan: ICM clinic phone appointment on 05/23/2018.    Copy of ICM check sent to Dr. Sallyanne Kuster.   3 month ICM trend: 04/25/2018    1 Year ICM trend:       Rosalene Billings, RN 04/25/2018 2:32 PM

## 2018-04-26 ENCOUNTER — Telehealth: Payer: Self-pay | Admitting: Family Medicine

## 2018-04-26 ENCOUNTER — Telehealth: Payer: Self-pay

## 2018-04-26 NOTE — Telephone Encounter (Signed)
Remote ICM transmission received.  Attempted call to patient and left detailed message, per DPR, regarding transmission and next ICM scheduled for 05/23/2018.  Advised to return call for any fluid symptoms or questions.    

## 2018-04-26 NOTE — Telephone Encounter (Signed)
Copied from [place] [id number]. Topic: General - Other >> Apr 26, 2018 11:50 AM Margot Ables wrote: Reason for CRM: Pt will not have [facility] RN visit today. She has called and pt did not answer or return calls.

## 2018-04-26 NOTE — Telephone Encounter (Signed)
FYI

## 2018-04-26 NOTE — Progress Notes (Signed)
thanks

## 2018-04-27 DIAGNOSIS — Z5112 Encounter for antineoplastic immunotherapy: Secondary | ICD-10-CM | POA: Diagnosis not present

## 2018-04-27 DIAGNOSIS — C84A0 Cutaneous T-cell lymphoma, unspecified, unspecified site: Secondary | ICD-10-CM | POA: Diagnosis not present

## 2018-04-27 DIAGNOSIS — C8400 Mycosis fungoides, unspecified site: Secondary | ICD-10-CM | POA: Diagnosis not present

## 2018-04-27 DIAGNOSIS — C8405 Mycosis fungoides, lymph nodes of inguinal region and lower limb: Secondary | ICD-10-CM | POA: Diagnosis not present

## 2018-04-28 DIAGNOSIS — I428 Other cardiomyopathies: Secondary | ICD-10-CM | POA: Diagnosis not present

## 2018-04-28 DIAGNOSIS — R6520 Severe sepsis without septic shock: Secondary | ICD-10-CM | POA: Diagnosis not present

## 2018-04-28 DIAGNOSIS — I110 Hypertensive heart disease with heart failure: Secondary | ICD-10-CM | POA: Diagnosis not present

## 2018-04-28 DIAGNOSIS — Z452 Encounter for adjustment and management of vascular access device: Secondary | ICD-10-CM | POA: Diagnosis not present

## 2018-04-28 DIAGNOSIS — C8400 Mycosis fungoides, unspecified site: Secondary | ICD-10-CM | POA: Diagnosis not present

## 2018-04-28 DIAGNOSIS — N179 Acute kidney failure, unspecified: Secondary | ICD-10-CM | POA: Diagnosis not present

## 2018-04-28 DIAGNOSIS — T827XXA Infection and inflammatory reaction due to other cardiac and vascular devices, implants and grafts, initial encounter: Secondary | ICD-10-CM | POA: Diagnosis not present

## 2018-04-28 DIAGNOSIS — A4181 Sepsis due to Enterococcus: Secondary | ICD-10-CM | POA: Diagnosis not present

## 2018-04-28 DIAGNOSIS — I5042 Chronic combined systolic (congestive) and diastolic (congestive) heart failure: Secondary | ICD-10-CM | POA: Diagnosis not present

## 2018-04-28 DIAGNOSIS — I2510 Atherosclerotic heart disease of native coronary artery without angina pectoris: Secondary | ICD-10-CM | POA: Diagnosis not present

## 2018-04-29 ENCOUNTER — Telehealth: Payer: Self-pay | Admitting: Emergency Medicine

## 2018-04-29 MED ORDER — GLUCOSE BLOOD VI STRP: ORAL_STRIP | 3 refills | Status: DC

## 2018-04-29 NOTE — Telephone Encounter (Signed)
sent 

## 2018-04-29 NOTE — Telephone Encounter (Signed)
Pts spouse called and wants to know if patient can get a refill on his test strips. Pharmacy is [place] [address]. Thanks.

## 2018-05-02 ENCOUNTER — Telehealth: Payer: Self-pay | Admitting: Family Medicine

## 2018-05-02 NOTE — Telephone Encounter (Signed)
I was called about this during the weekend, I think this has been sent already by the after hour nurse. Can you please check on this. I gave her instructions about how to send the Rx.

## 2018-05-02 NOTE — Telephone Encounter (Signed)
Copied from [place]. Topic: General - Other >> May 02, 2018  4:34 PM Mcneil, Ja-Kwan wrote: Reason for CRM: Pt states he has been having to use more test strips than normal because his levels have been up and down. Pt request a new Rx for diabetic equipment and test strip as his insurance will not cover the old equipment until mid August or September. Pt cb# [phone]

## 2018-05-03 DIAGNOSIS — I428 Other cardiomyopathies: Secondary | ICD-10-CM | POA: Diagnosis not present

## 2018-05-03 DIAGNOSIS — T827XXA Infection and inflammatory reaction due to other cardiac and vascular devices, implants and grafts, initial encounter: Secondary | ICD-10-CM | POA: Diagnosis not present

## 2018-05-03 DIAGNOSIS — R6520 Severe sepsis without septic shock: Secondary | ICD-10-CM | POA: Diagnosis not present

## 2018-05-03 DIAGNOSIS — A4181 Sepsis due to Enterococcus: Secondary | ICD-10-CM | POA: Diagnosis not present

## 2018-05-03 DIAGNOSIS — I110 Hypertensive heart disease with heart failure: Secondary | ICD-10-CM | POA: Diagnosis not present

## 2018-05-03 DIAGNOSIS — C8400 Mycosis fungoides, unspecified site: Secondary | ICD-10-CM | POA: Diagnosis not present

## 2018-05-03 DIAGNOSIS — N179 Acute kidney failure, unspecified: Secondary | ICD-10-CM | POA: Diagnosis not present

## 2018-05-03 DIAGNOSIS — Z452 Encounter for adjustment and management of vascular access device: Secondary | ICD-10-CM | POA: Diagnosis not present

## 2018-05-03 DIAGNOSIS — I2510 Atherosclerotic heart disease of native coronary artery without angina pectoris: Secondary | ICD-10-CM | POA: Diagnosis not present

## 2018-05-03 DIAGNOSIS — I5042 Chronic combined systolic (congestive) and diastolic (congestive) heart failure: Secondary | ICD-10-CM | POA: Diagnosis not present

## 2018-05-03 NOTE — Telephone Encounter (Signed)
Called pt to check on the RX and had to [facility]

## 2018-05-04 DIAGNOSIS — I428 Other cardiomyopathies: Secondary | ICD-10-CM | POA: Diagnosis not present

## 2018-05-04 DIAGNOSIS — I110 Hypertensive heart disease with heart failure: Secondary | ICD-10-CM | POA: Diagnosis not present

## 2018-05-04 DIAGNOSIS — I5042 Chronic combined systolic (congestive) and diastolic (congestive) heart failure: Secondary | ICD-10-CM | POA: Diagnosis not present

## 2018-05-04 DIAGNOSIS — N179 Acute kidney failure, unspecified: Secondary | ICD-10-CM | POA: Diagnosis not present

## 2018-05-04 DIAGNOSIS — R6520 Severe sepsis without septic shock: Secondary | ICD-10-CM | POA: Diagnosis not present

## 2018-05-04 DIAGNOSIS — T827XXA Infection and inflammatory reaction due to other cardiac and vascular devices, implants and grafts, initial encounter: Secondary | ICD-10-CM | POA: Diagnosis not present

## 2018-05-04 DIAGNOSIS — I2510 Atherosclerotic heart disease of native coronary artery without angina pectoris: Secondary | ICD-10-CM | POA: Diagnosis not present

## 2018-05-04 DIAGNOSIS — A4181 Sepsis due to Enterococcus: Secondary | ICD-10-CM | POA: Diagnosis not present

## 2018-05-04 DIAGNOSIS — Z452 Encounter for adjustment and management of vascular access device: Secondary | ICD-10-CM | POA: Diagnosis not present

## 2018-05-04 DIAGNOSIS — C8400 Mycosis fungoides, unspecified site: Secondary | ICD-10-CM | POA: Diagnosis not present

## 2018-05-10 ENCOUNTER — Telehealth: Payer: Self-pay | Admitting: *Deleted

## 2018-05-10 DIAGNOSIS — T827XXA Infection and inflammatory reaction due to other cardiac and vascular devices, implants and grafts, initial encounter: Secondary | ICD-10-CM | POA: Diagnosis not present

## 2018-05-10 DIAGNOSIS — C8400 Mycosis fungoides, unspecified site: Secondary | ICD-10-CM | POA: Diagnosis not present

## 2018-05-10 DIAGNOSIS — R6520 Severe sepsis without septic shock: Secondary | ICD-10-CM | POA: Diagnosis not present

## 2018-05-10 DIAGNOSIS — Z452 Encounter for adjustment and management of vascular access device: Secondary | ICD-10-CM | POA: Diagnosis not present

## 2018-05-10 DIAGNOSIS — I428 Other cardiomyopathies: Secondary | ICD-10-CM | POA: Diagnosis not present

## 2018-05-10 DIAGNOSIS — A4181 Sepsis due to Enterococcus: Secondary | ICD-10-CM | POA: Diagnosis not present

## 2018-05-10 DIAGNOSIS — I2510 Atherosclerotic heart disease of native coronary artery without angina pectoris: Secondary | ICD-10-CM | POA: Diagnosis not present

## 2018-05-10 DIAGNOSIS — N179 Acute kidney failure, unspecified: Secondary | ICD-10-CM | POA: Diagnosis not present

## 2018-05-10 DIAGNOSIS — I5042 Chronic combined systolic (congestive) and diastolic (congestive) heart failure: Secondary | ICD-10-CM | POA: Diagnosis not present

## 2018-05-10 DIAGNOSIS — I110 Hypertensive heart disease with heart failure: Secondary | ICD-10-CM | POA: Diagnosis not present

## 2018-05-10 NOTE — Telephone Encounter (Signed)
[facility] given orders back the end of June for pt to receive PT and home health nursing for this patient.  [facility] wants to get verbal orders to continue with the nursing visits.  Dr. Elease Hashimoto, Please advise. Thank you.

## 2018-05-10 NOTE — Telephone Encounter (Signed)
OK to continue? 

## 2018-05-10 NOTE — Telephone Encounter (Signed)
Copied from [place] [id number]. Topic: Inquiry >> May 10, 2018  3:16 PM Oliver Pila B wrote: Reason for CRM: [facility] called to extend home health nursing for pt, needing verbals, contact [phone] leave message if needed

## 2018-05-11 DIAGNOSIS — C8400 Mycosis fungoides, unspecified site: Secondary | ICD-10-CM | POA: Diagnosis not present

## 2018-05-11 DIAGNOSIS — Z5111 Encounter for antineoplastic chemotherapy: Secondary | ICD-10-CM | POA: Diagnosis not present

## 2018-05-11 NOTE — Telephone Encounter (Signed)
Left message on machine for [facility] nurse to return our call.   CRM created

## 2018-05-16 NOTE — Telephone Encounter (Signed)
Left message on machine for [facility] to return our call

## 2018-05-18 DIAGNOSIS — I2510 Atherosclerotic heart disease of native coronary artery without angina pectoris: Secondary | ICD-10-CM | POA: Diagnosis not present

## 2018-05-18 DIAGNOSIS — A4181 Sepsis due to Enterococcus: Secondary | ICD-10-CM | POA: Diagnosis not present

## 2018-05-18 DIAGNOSIS — T827XXA Infection and inflammatory reaction due to other cardiac and vascular devices, implants and grafts, initial encounter: Secondary | ICD-10-CM | POA: Diagnosis not present

## 2018-05-18 DIAGNOSIS — Z452 Encounter for adjustment and management of vascular access device: Secondary | ICD-10-CM | POA: Diagnosis not present

## 2018-05-18 DIAGNOSIS — C8400 Mycosis fungoides, unspecified site: Secondary | ICD-10-CM | POA: Diagnosis not present

## 2018-05-18 DIAGNOSIS — I428 Other cardiomyopathies: Secondary | ICD-10-CM | POA: Diagnosis not present

## 2018-05-18 DIAGNOSIS — I110 Hypertensive heart disease with heart failure: Secondary | ICD-10-CM | POA: Diagnosis not present

## 2018-05-18 DIAGNOSIS — I5042 Chronic combined systolic (congestive) and diastolic (congestive) heart failure: Secondary | ICD-10-CM | POA: Diagnosis not present

## 2018-05-18 DIAGNOSIS — R6520 Severe sepsis without septic shock: Secondary | ICD-10-CM | POA: Diagnosis not present

## 2018-05-18 DIAGNOSIS — N179 Acute kidney failure, unspecified: Secondary | ICD-10-CM | POA: Diagnosis not present

## 2018-05-18 NOTE — Telephone Encounter (Signed)
Copied from [place] [phone]. Topic: Inquiry >> May 18, 2018 10:00 AM Oneta Rack wrote: Osvaldo Human name:  Rip Harbour  Relation to pt: nurse navigator from [facility]  Call back number: [phone] ext [phone]    Reason for call:  Checking on the status of verbal orders for skilled nursing and pic line care for infusion and lab draw. Nurse Navigator states patient needs a visit today and would like verbal orders as soon as possible, please advise

## 2018-05-18 NOTE — Telephone Encounter (Signed)
OK to give verbal orders as requested.

## 2018-05-18 NOTE — Telephone Encounter (Signed)
Verbal orders given to [facility] for skilled nursing and PT.

## 2018-05-20 ENCOUNTER — Ambulatory Visit: Payer: Medicare HMO | Admitting: Family Medicine

## 2018-05-20 ENCOUNTER — Ambulatory Visit (INDEPENDENT_AMBULATORY_CARE_PROVIDER_SITE_OTHER): Payer: Medicare HMO | Admitting: Family Medicine

## 2018-05-20 ENCOUNTER — Other Ambulatory Visit: Payer: Self-pay | Admitting: Family Medicine

## 2018-05-20 VITALS — BP 106/60 | HR 98 | Temp 97.6°F | Wt 215.5 lb

## 2018-05-20 DIAGNOSIS — R69 Illness, unspecified: Secondary | ICD-10-CM | POA: Diagnosis not present

## 2018-05-20 DIAGNOSIS — E1165 Type 2 diabetes mellitus with hyperglycemia: Secondary | ICD-10-CM

## 2018-05-20 DIAGNOSIS — E1159 Type 2 diabetes mellitus with other circulatory complications: Secondary | ICD-10-CM

## 2018-05-20 DIAGNOSIS — G609 Hereditary and idiopathic neuropathy, unspecified: Secondary | ICD-10-CM

## 2018-05-20 MED ORDER — DULOXETINE HCL 30 MG PO CPEP: 30.0000 mg | ORAL_CAPSULE | Freq: Every day | ORAL | 0 refills | Status: DC

## 2018-05-20 MED ORDER — GLUCOSE BLOOD VI STRP: ORAL_STRIP | 3 refills | Status: DC

## 2018-05-20 MED ORDER — DULOXETINE HCL 60 MG PO CPEP: 60.0000 mg | ORAL_CAPSULE | Freq: Every day | ORAL | 11 refills | Status: DC
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
## 2018-05-20 NOTE — Patient Instructions (Signed)
STOP the Wellbutrin  Start the Cymbalta 30 mg once daily for one week  If no side effects, then increase the Cymbalta to 60 mg.

## 2018-05-23 ENCOUNTER — Telehealth: Payer: Self-pay

## 2018-05-23 ENCOUNTER — Ambulatory Visit (INDEPENDENT_AMBULATORY_CARE_PROVIDER_SITE_OTHER): Payer: Medicare HMO

## 2018-05-23 ENCOUNTER — Ambulatory Visit (INDEPENDENT_AMBULATORY_CARE_PROVIDER_SITE_OTHER): Payer: Medicare HMO | Admitting: *Deleted

## 2018-05-23 DIAGNOSIS — T827XXA Infection and inflammatory reaction due to other cardiac and vascular devices, implants and grafts, initial encounter: Secondary | ICD-10-CM | POA: Diagnosis not present

## 2018-05-23 DIAGNOSIS — I5042 Chronic combined systolic (congestive) and diastolic (congestive) heart failure: Secondary | ICD-10-CM

## 2018-05-23 DIAGNOSIS — R6520 Severe sepsis without septic shock: Secondary | ICD-10-CM | POA: Diagnosis not present

## 2018-05-23 DIAGNOSIS — I110 Hypertensive heart disease with heart failure: Secondary | ICD-10-CM | POA: Diagnosis not present

## 2018-05-23 DIAGNOSIS — Z95810 Presence of automatic (implantable) cardiac defibrillator: Secondary | ICD-10-CM

## 2018-05-23 DIAGNOSIS — Z452 Encounter for adjustment and management of vascular access device: Secondary | ICD-10-CM | POA: Diagnosis not present

## 2018-05-23 DIAGNOSIS — I428 Other cardiomyopathies: Secondary | ICD-10-CM | POA: Diagnosis not present

## 2018-05-23 DIAGNOSIS — I2510 Atherosclerotic heart disease of native coronary artery without angina pectoris: Secondary | ICD-10-CM | POA: Diagnosis not present

## 2018-05-23 DIAGNOSIS — N179 Acute kidney failure, unspecified: Secondary | ICD-10-CM | POA: Diagnosis not present

## 2018-05-23 DIAGNOSIS — C8400 Mycosis fungoides, unspecified site: Secondary | ICD-10-CM | POA: Diagnosis not present

## 2018-05-23 DIAGNOSIS — A4181 Sepsis due to Enterococcus: Secondary | ICD-10-CM | POA: Diagnosis not present

## 2018-05-23 NOTE — Telephone Encounter (Signed)
Remote ICM transmission received.  Attempted call to patient and mail box is full. 

## 2018-05-23 NOTE — Progress Notes (Signed)
EPIC Encounter for ICM Monitoring  Patient Name: Eric Price is a 67 y.o. male Date: 05/23/2018 Primary Care Physican: [name], MD Primary Cardiologist:Berry Electrophysiologist:Croitoru Dry Weight:Previous weight 207 lbs      Attempted call to patient and unable to reach.   Transmission reviewed.    Thoracic impedance normal.  Prescribed dosage: Furosemide80 mg take 1 tablet daily  Labs: 04/18/2018 Creatinine 1.22, BUN 15, Potassium 4.4, Sodium 139, EGFR 76.01 04/01/2018 Creatinine 1.49. BIN 25, Potassium 4.3, Sodium 140, EGFR 6036 03/13/2018 Creatinine1.03, BUN9, Potassium3.9, [id number], EGFR>60 03/11/2018 Creatinine1.00, BUN11, Potassium3.9, Sodium140, EGFR>60  03/10/2018 Creatinine1.16, BUN14, Potassium3.8, Sodium141, EGFR>60  03/09/2018 Creatinine1.33, BUN19, Potassium3.9, Sodium138, EGFR54->60  03/08/2018 Creatinine1.96, BUN27, Potassium4.0, Sodium134, OFHQ19-75 @ 3:49 AM  03/08/2018 Creatinine1.96, BUN27, Potassium4.0, OITGPQ982, MEBR83-09 @ 1:15 AM 11/25/2017 Creatinine 1.10, BUN 23, Potassium 4.8, Sodium 138, EGFR 69-80 11/23/2017 Creatinine 1.19, BUN 29, Potassium 4.3, Sodium 140, EGFR 63-73  Recommendations: NONE - Unable to reach..  Follow-up plan: ICM clinic phone appointment on 06/30/2018.    Copy of ICM check sent to Dr. Sallyanne Kuster.   3 month ICM trend: 05/23/2018    1 Year ICM trend:       Rosalene Billings, RN 05/23/2018 1:11 PM

## 2018-05-23 NOTE — Progress Notes (Signed)
Patient returned call.  He stated he is doing very well and denied any fluid symptoms.  He is making sure he stays hydrated when outside in the heat.  Transmission reviewed and no changes today.  Encouraged to call for fluid symptoms.  Next ICM remote transmission 06/30/2018.

## 2018-05-24 DIAGNOSIS — I428 Other cardiomyopathies: Secondary | ICD-10-CM | POA: Diagnosis not present

## 2018-05-24 DIAGNOSIS — I2510 Atherosclerotic heart disease of native coronary artery without angina pectoris: Secondary | ICD-10-CM | POA: Diagnosis not present

## 2018-05-24 DIAGNOSIS — Z452 Encounter for adjustment and management of vascular access device: Secondary | ICD-10-CM | POA: Diagnosis not present

## 2018-05-24 DIAGNOSIS — I110 Hypertensive heart disease with heart failure: Secondary | ICD-10-CM | POA: Diagnosis not present

## 2018-05-24 DIAGNOSIS — N179 Acute kidney failure, unspecified: Secondary | ICD-10-CM | POA: Diagnosis not present

## 2018-05-24 DIAGNOSIS — R6520 Severe sepsis without septic shock: Secondary | ICD-10-CM | POA: Diagnosis not present

## 2018-05-24 DIAGNOSIS — T827XXA Infection and inflammatory reaction due to other cardiac and vascular devices, implants and grafts, initial encounter: Secondary | ICD-10-CM | POA: Diagnosis not present

## 2018-05-24 DIAGNOSIS — C8400 Mycosis fungoides, unspecified site: Secondary | ICD-10-CM | POA: Diagnosis not present

## 2018-05-24 DIAGNOSIS — A4181 Sepsis due to Enterococcus: Secondary | ICD-10-CM | POA: Diagnosis not present

## 2018-05-24 DIAGNOSIS — I5042 Chronic combined systolic (congestive) and diastolic (congestive) heart failure: Secondary | ICD-10-CM | POA: Diagnosis not present

## 2018-05-24 NOTE — Progress Notes (Signed)
Remote ICD transmission.   

## 2018-05-25 ENCOUNTER — Telehealth: Payer: Self-pay | Admitting: Family Medicine

## 2018-05-25 DIAGNOSIS — Z923 Personal history of irradiation: Secondary | ICD-10-CM | POA: Diagnosis not present

## 2018-05-25 DIAGNOSIS — C84A0 Cutaneous T-cell lymphoma, unspecified, unspecified site: Secondary | ICD-10-CM | POA: Diagnosis not present

## 2018-05-25 DIAGNOSIS — C8400 Mycosis fungoides, unspecified site: Secondary | ICD-10-CM | POA: Diagnosis not present

## 2018-05-25 DIAGNOSIS — R1903 Right lower quadrant abdominal swelling, mass and lump: Secondary | ICD-10-CM | POA: Diagnosis not present

## 2018-05-25 DIAGNOSIS — E119 Type 2 diabetes mellitus without complications: Secondary | ICD-10-CM | POA: Diagnosis not present

## 2018-05-25 DIAGNOSIS — K760 Fatty (change of) liver, not elsewhere classified: Secondary | ICD-10-CM | POA: Diagnosis not present

## 2018-05-25 DIAGNOSIS — R5383 Other fatigue: Secondary | ICD-10-CM | POA: Diagnosis not present

## 2018-05-25 DIAGNOSIS — Z5111 Encounter for antineoplastic chemotherapy: Secondary | ICD-10-CM | POA: Diagnosis not present

## 2018-05-25 DIAGNOSIS — Z794 Long term (current) use of insulin: Secondary | ICD-10-CM | POA: Diagnosis not present

## 2018-05-25 NOTE — Telephone Encounter (Signed)
Copied from [place] [id number]. Topic: Quick Communication - See Telephone Encounter >> May 25, 2018 10:53 AM Reyne Dumas L wrote: CRM for notification. See Telephone encounter for: 05/25/18. Jeralyn calling from [place] to get verbal orders: [facility] for 6 weeks because he is going to get chemo they are going to do one time a week on his chemo weeks and two times a week on his off weeks. Wilfred Curtis can be reached at [phone]

## 2018-05-25 NOTE — Telephone Encounter (Signed)
OK 

## 2018-05-25 NOTE — Telephone Encounter (Signed)
Left detailed message on machine For Eric Price with verbal orders for PT

## 2018-05-26 ENCOUNTER — Other Ambulatory Visit: Payer: Self-pay | Admitting: Family Medicine

## 2018-06-02 DIAGNOSIS — C8400 Mycosis fungoides, unspecified site: Secondary | ICD-10-CM | POA: Diagnosis not present

## 2018-06-02 DIAGNOSIS — C84A0 Cutaneous T-cell lymphoma, unspecified, unspecified site: Secondary | ICD-10-CM | POA: Diagnosis not present

## 2018-06-02 DIAGNOSIS — N400 Enlarged prostate without lower urinary tract symptoms: Secondary | ICD-10-CM | POA: Diagnosis not present

## 2018-06-02 DIAGNOSIS — M25552 Pain in left hip: Secondary | ICD-10-CM | POA: Diagnosis not present

## 2018-06-06 DIAGNOSIS — N179 Acute kidney failure, unspecified: Secondary | ICD-10-CM | POA: Diagnosis not present

## 2018-06-06 DIAGNOSIS — I2510 Atherosclerotic heart disease of native coronary artery without angina pectoris: Secondary | ICD-10-CM | POA: Diagnosis not present

## 2018-06-06 DIAGNOSIS — I110 Hypertensive heart disease with heart failure: Secondary | ICD-10-CM | POA: Diagnosis not present

## 2018-06-06 DIAGNOSIS — Z452 Encounter for adjustment and management of vascular access device: Secondary | ICD-10-CM | POA: Diagnosis not present

## 2018-06-06 DIAGNOSIS — I428 Other cardiomyopathies: Secondary | ICD-10-CM | POA: Diagnosis not present

## 2018-06-06 DIAGNOSIS — R6520 Severe sepsis without septic shock: Secondary | ICD-10-CM | POA: Diagnosis not present

## 2018-06-06 DIAGNOSIS — C8400 Mycosis fungoides, unspecified site: Secondary | ICD-10-CM | POA: Diagnosis not present

## 2018-06-06 DIAGNOSIS — T827XXA Infection and inflammatory reaction due to other cardiac and vascular devices, implants and grafts, initial encounter: Secondary | ICD-10-CM | POA: Diagnosis not present

## 2018-06-06 DIAGNOSIS — A4181 Sepsis due to Enterococcus: Secondary | ICD-10-CM | POA: Diagnosis not present

## 2018-06-06 DIAGNOSIS — I5042 Chronic combined systolic (congestive) and diastolic (congestive) heart failure: Secondary | ICD-10-CM | POA: Diagnosis not present

## 2018-06-08 DIAGNOSIS — R599 Enlarged lymph nodes, unspecified: Secondary | ICD-10-CM | POA: Diagnosis not present

## 2018-06-08 DIAGNOSIS — E785 Hyperlipidemia, unspecified: Secondary | ICD-10-CM | POA: Diagnosis not present

## 2018-06-08 DIAGNOSIS — I2510 Atherosclerotic heart disease of native coronary artery without angina pectoris: Secondary | ICD-10-CM | POA: Diagnosis not present

## 2018-06-08 DIAGNOSIS — N401 Enlarged prostate with lower urinary tract symptoms: Secondary | ICD-10-CM | POA: Diagnosis not present

## 2018-06-08 DIAGNOSIS — R140 Abdominal distension (gaseous): Secondary | ICD-10-CM | POA: Diagnosis not present

## 2018-06-08 DIAGNOSIS — R69 Illness, unspecified: Secondary | ICD-10-CM | POA: Diagnosis not present

## 2018-06-08 DIAGNOSIS — C8408 Mycosis fungoides, lymph nodes of multiple sites: Secondary | ICD-10-CM | POA: Diagnosis not present

## 2018-06-08 DIAGNOSIS — N138 Other obstructive and reflux uropathy: Secondary | ICD-10-CM | POA: Diagnosis not present

## 2018-06-08 DIAGNOSIS — M109 Gout, unspecified: Secondary | ICD-10-CM | POA: Diagnosis not present

## 2018-06-08 DIAGNOSIS — I429 Cardiomyopathy, unspecified: Secondary | ICD-10-CM | POA: Diagnosis not present

## 2018-06-08 DIAGNOSIS — E1151 Type 2 diabetes mellitus with diabetic peripheral angiopathy without gangrene: Secondary | ICD-10-CM | POA: Diagnosis not present

## 2018-06-08 DIAGNOSIS — G63 Polyneuropathy in diseases classified elsewhere: Secondary | ICD-10-CM | POA: Diagnosis not present

## 2018-06-08 DIAGNOSIS — N179 Acute kidney failure, unspecified: Secondary | ICD-10-CM | POA: Diagnosis not present

## 2018-06-08 DIAGNOSIS — E349 Endocrine disorder, unspecified: Secondary | ICD-10-CM | POA: Diagnosis not present

## 2018-06-08 DIAGNOSIS — J9811 Atelectasis: Secondary | ICD-10-CM | POA: Diagnosis not present

## 2018-06-08 DIAGNOSIS — I110 Hypertensive heart disease with heart failure: Secondary | ICD-10-CM | POA: Diagnosis not present

## 2018-06-08 DIAGNOSIS — C8400 Mycosis fungoides, unspecified site: Secondary | ICD-10-CM | POA: Diagnosis not present

## 2018-06-08 DIAGNOSIS — I5042 Chronic combined systolic (congestive) and diastolic (congestive) heart failure: Secondary | ICD-10-CM | POA: Diagnosis not present

## 2018-06-09 DIAGNOSIS — N5082 Scrotal pain: Secondary | ICD-10-CM | POA: Insufficient documentation

## 2018-06-09 DIAGNOSIS — E878 Other disorders of electrolyte and fluid balance, not elsewhere classified: Secondary | ICD-10-CM | POA: Insufficient documentation

## 2018-06-09 DIAGNOSIS — R3911 Hesitancy of micturition: Secondary | ICD-10-CM | POA: Insufficient documentation

## 2018-06-09 DIAGNOSIS — R140 Abdominal distension (gaseous): Secondary | ICD-10-CM | POA: Insufficient documentation

## 2018-06-09 DIAGNOSIS — N5089 Other specified disorders of the male genital organs: Secondary | ICD-10-CM | POA: Insufficient documentation

## 2018-06-09 DIAGNOSIS — R062 Wheezing: Secondary | ICD-10-CM | POA: Insufficient documentation

## 2018-06-10 DIAGNOSIS — G63 Polyneuropathy in diseases classified elsewhere: Secondary | ICD-10-CM | POA: Diagnosis not present

## 2018-06-10 DIAGNOSIS — C8400 Mycosis fungoides, unspecified site: Secondary | ICD-10-CM | POA: Diagnosis not present

## 2018-06-10 DIAGNOSIS — N401 Enlarged prostate with lower urinary tract symptoms: Secondary | ICD-10-CM | POA: Diagnosis not present

## 2018-06-10 DIAGNOSIS — J9811 Atelectasis: Secondary | ICD-10-CM | POA: Diagnosis not present

## 2018-06-10 DIAGNOSIS — R140 Abdominal distension (gaseous): Secondary | ICD-10-CM | POA: Diagnosis not present

## 2018-06-10 DIAGNOSIS — E349 Endocrine disorder, unspecified: Secondary | ICD-10-CM | POA: Diagnosis not present

## 2018-06-10 DIAGNOSIS — N138 Other obstructive and reflux uropathy: Secondary | ICD-10-CM | POA: Diagnosis not present

## 2018-06-10 DIAGNOSIS — I5042 Chronic combined systolic (congestive) and diastolic (congestive) heart failure: Secondary | ICD-10-CM | POA: Diagnosis not present

## 2018-06-11 ENCOUNTER — Other Ambulatory Visit: Payer: Self-pay | Admitting: Internal Medicine

## 2018-06-11 DIAGNOSIS — C8400 Mycosis fungoides, unspecified site: Secondary | ICD-10-CM | POA: Diagnosis not present

## 2018-06-12 DIAGNOSIS — C8400 Mycosis fungoides, unspecified site: Secondary | ICD-10-CM | POA: Diagnosis not present

## 2018-06-12 MED ORDER — GENERIC EXTERNAL MEDICATION
10.00 | Status: DC
Start: ? — End: 2018-06-12

## 2018-06-12 MED ORDER — ACETAMINOPHEN 325 MG PO TABS
650.00 | ORAL_TABLET | ORAL | Status: DC
Start: ? — End: 2018-06-12

## 2018-06-12 MED ORDER — TAMSULOSIN HCL 0.4 MG PO CAPS
0.40 | ORAL_CAPSULE | ORAL | Status: DC
Start: 2018-06-13 — End: 2018-06-12

## 2018-06-12 MED ORDER — FIRST-MOUTHWASH BLM MT SUSP
5.00 | OROMUCOSAL | Status: DC
Start: ? — End: 2018-06-12

## 2018-06-12 MED ORDER — PROCHLORPERAZINE MALEATE 10 MG PO TABS
10.00 | ORAL_TABLET | ORAL | Status: DC
Start: ? — End: 2018-06-12

## 2018-06-12 MED ORDER — LORAZEPAM 2 MG/ML IJ SOLN
1.00 | INTRAMUSCULAR | Status: DC
Start: ? — End: 2018-06-12

## 2018-06-12 MED ORDER — INSULIN LISPRO 100 UNIT/ML ~~LOC~~ SOLN
12.00 | SUBCUTANEOUS | Status: DC
Start: 2018-06-13 — End: 2018-06-12

## 2018-06-12 MED ORDER — LORAZEPAM 1 MG PO TABS
1.00 | ORAL_TABLET | ORAL | Status: DC
Start: ? — End: 2018-06-12

## 2018-06-12 MED ORDER — MELATONIN 3 MG PO TABS
3.00 | ORAL_TABLET | ORAL | Status: DC
Start: 2018-06-12 — End: 2018-06-12

## 2018-06-12 MED ORDER — POLYETHYLENE GLYCOL 3350 17 G PO PACK
17.00 | PACK | ORAL | Status: DC
Start: ? — End: 2018-06-12

## 2018-06-12 MED ORDER — DEXTROSE 50 % IV SOLN
12.50 | INTRAVENOUS | Status: DC
Start: ? — End: 2018-06-12

## 2018-06-12 MED ORDER — FEXOFENADINE HCL 60 MG PO TABS
60.00 | ORAL_TABLET | ORAL | Status: DC
Start: 2018-06-13 — End: 2018-06-12

## 2018-06-12 MED ORDER — FUROSEMIDE 80 MG PO TABS
80.00 | ORAL_TABLET | ORAL | Status: DC
Start: 2018-06-13 — End: 2018-06-12

## 2018-06-12 MED ORDER — SENNOSIDES-DOCUSATE SODIUM 8.6-50 MG PO TABS
2.00 | ORAL_TABLET | ORAL | Status: DC
Start: 2018-06-13 — End: 2018-06-12

## 2018-06-12 MED ORDER — TRAMADOL HCL 50 MG PO TABS
50.00 | ORAL_TABLET | ORAL | Status: DC
Start: ? — End: 2018-06-12

## 2018-06-12 MED ORDER — INSULIN GLARGINE 100 UNIT/ML ~~LOC~~ SOLN
25.00 | SUBCUTANEOUS | Status: DC
Start: 2018-06-12 — End: 2018-06-12

## 2018-06-12 MED ORDER — INSULIN LISPRO 100 UNIT/ML ~~LOC~~ SOLN
0.00 | SUBCUTANEOUS | Status: DC
Start: 2018-06-13 — End: 2018-06-12

## 2018-06-12 MED ORDER — ALBUTEROL SULFATE HFA 108 (90 BASE) MCG/ACT IN AERS
2.00 | INHALATION_SPRAY | RESPIRATORY_TRACT | Status: DC
Start: ? — End: 2018-06-12

## 2018-06-12 MED ORDER — LIDOCAINE 5 % EX PTCH
1.00 | MEDICATED_PATCH | CUTANEOUS | Status: DC
Start: 2018-06-13 — End: 2018-06-12

## 2018-06-12 MED ORDER — IPRATROPIUM-ALBUTEROL 0.5-2.5 (3) MG/3ML IN SOLN
3.00 | RESPIRATORY_TRACT | Status: DC
Start: ? — End: 2018-06-12

## 2018-06-12 MED ORDER — LIDOCAINE HCL 1 % IJ SOLN
.50 | INTRAMUSCULAR | Status: DC
Start: ? — End: 2018-06-12

## 2018-06-12 MED ORDER — HYDROXYZINE PAMOATE 25 MG PO CAPS
25.00 | ORAL_CAPSULE | ORAL | Status: DC
Start: ? — End: 2018-06-12

## 2018-06-12 MED ORDER — ATORVASTATIN CALCIUM 20 MG PO TABS
20.00 | ORAL_TABLET | ORAL | Status: DC
Start: 2018-06-13 — End: 2018-06-12

## 2018-06-12 MED ORDER — TIZANIDINE HCL 2 MG PO TABS
2.00 | ORAL_TABLET | ORAL | Status: DC
Start: ? — End: 2018-06-12

## 2018-06-12 MED ORDER — GENERIC EXTERNAL MEDICATION
1.00 | Status: DC
Start: ? — End: 2018-06-12

## 2018-06-12 MED ORDER — APIXABAN 5 MG PO TABS
5.00 | ORAL_TABLET | ORAL | Status: DC
Start: 2018-06-12 — End: 2018-06-12

## 2018-06-12 MED ORDER — AMITRIPTYLINE HCL 50 MG PO TABS
50.00 | ORAL_TABLET | ORAL | Status: DC
Start: 2018-06-13 — End: 2018-06-12

## 2018-06-12 MED ORDER — GENERIC EXTERNAL MEDICATION
Status: DC
Start: 2018-06-13 — End: 2018-06-12

## 2018-06-12 MED ORDER — CARVEDILOL 25 MG PO TABS
25.00 | ORAL_TABLET | ORAL | Status: DC
Start: 2018-06-13 — End: 2018-06-12

## 2018-06-12 MED ORDER — DULOXETINE HCL 30 MG PO CPEP
30.00 | ORAL_CAPSULE | ORAL | Status: DC
Start: 2018-06-13 — End: 2018-06-12

## 2018-06-15 ENCOUNTER — Ambulatory Visit (INDEPENDENT_AMBULATORY_CARE_PROVIDER_SITE_OTHER): Payer: Medicare HMO | Admitting: Family Medicine

## 2018-06-15 ENCOUNTER — Encounter: Payer: Self-pay | Admitting: Family Medicine

## 2018-06-15 ENCOUNTER — Ambulatory Visit (INDEPENDENT_AMBULATORY_CARE_PROVIDER_SITE_OTHER): Payer: Medicare HMO

## 2018-06-15 VITALS — BP 102/62 | HR 109 | Temp 98.9°F | Wt 226.1 lb

## 2018-06-15 DIAGNOSIS — I5042 Chronic combined systolic (congestive) and diastolic (congestive) heart failure: Secondary | ICD-10-CM | POA: Diagnosis not present

## 2018-06-15 DIAGNOSIS — E1159 Type 2 diabetes mellitus with other circulatory complications: Secondary | ICD-10-CM | POA: Diagnosis not present

## 2018-06-15 DIAGNOSIS — R140 Abdominal distension (gaseous): Secondary | ICD-10-CM | POA: Diagnosis not present

## 2018-06-15 DIAGNOSIS — E1165 Type 2 diabetes mellitus with hyperglycemia: Secondary | ICD-10-CM

## 2018-06-15 DIAGNOSIS — R600 Localized edema: Secondary | ICD-10-CM

## 2018-06-15 DIAGNOSIS — N5089 Other specified disorders of the male genital organs: Secondary | ICD-10-CM
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
## 2018-06-15 NOTE — Patient Instructions (Addendum)
Increase the Furosemide 80 mg to one twice daily until follow up.  Elevate right leg frequently.  Follow up in one week and sooner for any increased shortness of breath or increased edema.

## 2018-06-16 DIAGNOSIS — I5042 Chronic combined systolic (congestive) and diastolic (congestive) heart failure: Secondary | ICD-10-CM | POA: Diagnosis not present

## 2018-06-16 DIAGNOSIS — A4181 Sepsis due to Enterococcus: Secondary | ICD-10-CM | POA: Diagnosis not present

## 2018-06-16 DIAGNOSIS — R6520 Severe sepsis without septic shock: Secondary | ICD-10-CM | POA: Diagnosis not present

## 2018-06-16 DIAGNOSIS — E785 Hyperlipidemia, unspecified: Secondary | ICD-10-CM | POA: Diagnosis not present

## 2018-06-16 DIAGNOSIS — E1142 Type 2 diabetes mellitus with diabetic polyneuropathy: Secondary | ICD-10-CM | POA: Diagnosis not present

## 2018-06-16 DIAGNOSIS — E1151 Type 2 diabetes mellitus with diabetic peripheral angiopathy without gangrene: Secondary | ICD-10-CM | POA: Diagnosis not present

## 2018-06-16 DIAGNOSIS — T827XXA Infection and inflammatory reaction due to other cardiac and vascular devices, implants and grafts, initial encounter: Secondary | ICD-10-CM | POA: Diagnosis not present

## 2018-06-16 LAB — BASIC METABOLIC PANEL
BUN: 30 mg/dL — ABNORMAL HIGH (ref 6–23)
CALCIUM: 9.7 mg/dL (ref 8.4–10.5)
CO2: 26 meq/L (ref 19–32)
CREATININE: 1.77 mg/dL — AB (ref 0.40–1.50)
Chloride: 99 mEq/L (ref 96–112)
GFR: 49.45 mL/min — AB (ref >60.00)
Glucose, Bld: 101 mg/dL — ABNORMAL HIGH (ref 70–99)
Potassium: 4.3 mEq/L (ref 3.5–5.1)
Sodium: 135 mEq/L (ref 135–145)

## 2018-06-16 LAB — BRAIN NATRIURETIC PEPTIDE: PRO B NATRI PEPTIDE: 17 pg/mL (ref 0.0–100.0)

## 2018-06-17 ENCOUNTER — Encounter: Payer: Self-pay | Admitting: *Deleted

## 2018-06-17 ENCOUNTER — Other Ambulatory Visit: Payer: Self-pay | Admitting: Family Medicine

## 2018-06-17 ENCOUNTER — Ambulatory Visit: Payer: Medicare HMO | Admitting: Internal Medicine

## 2018-06-17 LAB — CUP PACEART REMOTE DEVICE CHECK
Battery Remaining Longevity: 119 mo
Date Time Interrogation Session: 20190819163426
HighPow Impedance: 71 Ohm
Implantable Lead Implant Date: 20161122
Implantable Lead Location: 753860
Implantable Pulse Generator Implant Date: 20161122
Lead Channel Pacing Threshold Amplitude: 0.75 V
Lead Channel Pacing Threshold Pulse Width: 0.4 ms
Lead Channel Sensing Intrinsic Amplitude: 7.625 mV
Lead Channel Sensing Intrinsic Amplitude: 7.625 mV
Lead Channel Setting Pacing Pulse Width: 0.4 ms
Lead Channel Setting Sensing Sensitivity: 0.3 mV
MDC IDC MSMT BATTERY VOLTAGE: 3.01 V
MDC IDC MSMT LEADCHNL RV IMPEDANCE VALUE: 342 Ohm
MDC IDC MSMT LEADCHNL RV IMPEDANCE VALUE: 437 Ohm
MDC IDC SET LEADCHNL RV PACING AMPLITUDE: 2.5 V
MDC IDC STAT BRADY RV PERCENT PACED: 0 %

## 2018-06-20 ENCOUNTER — Ambulatory Visit: Payer: Medicare HMO | Admitting: Family Medicine

## 2018-06-20 ENCOUNTER — Encounter: Payer: Self-pay | Admitting: Family Medicine

## 2018-06-20 DIAGNOSIS — C8408 Mycosis fungoides, lymph nodes of multiple sites: Secondary | ICD-10-CM | POA: Diagnosis not present

## 2018-06-22 ENCOUNTER — Encounter: Payer: Self-pay | Admitting: Family Medicine

## 2018-06-22 ENCOUNTER — Other Ambulatory Visit: Payer: Self-pay

## 2018-06-22 ENCOUNTER — Ambulatory Visit (INDEPENDENT_AMBULATORY_CARE_PROVIDER_SITE_OTHER): Payer: Medicare HMO | Admitting: Family Medicine

## 2018-06-22 VITALS — BP 122/60 | HR 107 | Temp 98.0°F | Resp 17 | Ht 69.0 in | Wt 209.9 lb

## 2018-06-22 DIAGNOSIS — K5900 Constipation, unspecified: Secondary | ICD-10-CM

## 2018-06-22 DIAGNOSIS — E1165 Type 2 diabetes mellitus with hyperglycemia: Secondary | ICD-10-CM | POA: Diagnosis not present

## 2018-06-22 DIAGNOSIS — R600 Localized edema: Secondary | ICD-10-CM

## 2018-06-22 DIAGNOSIS — N179 Acute kidney failure, unspecified: Secondary | ICD-10-CM | POA: Diagnosis not present

## 2018-06-22 DIAGNOSIS — E1159 Type 2 diabetes mellitus with other circulatory complications: Secondary | ICD-10-CM

## 2018-06-22 DIAGNOSIS — R69 Illness, unspecified: Secondary | ICD-10-CM | POA: Diagnosis not present

## 2018-06-22 NOTE — Patient Instructions (Signed)
Continue with the morning dose of Lasix at 80 mg  Reduce noon dose of lasix to 40 mg.
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
## 2018-06-23 DIAGNOSIS — E1142 Type 2 diabetes mellitus with diabetic polyneuropathy: Secondary | ICD-10-CM | POA: Diagnosis not present

## 2018-06-23 DIAGNOSIS — T827XXA Infection and inflammatory reaction due to other cardiac and vascular devices, implants and grafts, initial encounter: Secondary | ICD-10-CM | POA: Diagnosis not present

## 2018-06-23 DIAGNOSIS — R6520 Severe sepsis without septic shock: Secondary | ICD-10-CM | POA: Diagnosis not present

## 2018-06-23 DIAGNOSIS — A4181 Sepsis due to Enterococcus: Secondary | ICD-10-CM | POA: Diagnosis not present

## 2018-06-23 DIAGNOSIS — E785 Hyperlipidemia, unspecified: Secondary | ICD-10-CM | POA: Diagnosis not present

## 2018-06-23 DIAGNOSIS — E1151 Type 2 diabetes mellitus with diabetic peripheral angiopathy without gangrene: Secondary | ICD-10-CM | POA: Diagnosis not present

## 2018-06-23 DIAGNOSIS — I5042 Chronic combined systolic (congestive) and diastolic (congestive) heart failure: Secondary | ICD-10-CM | POA: Diagnosis not present

## 2018-06-24 DIAGNOSIS — A4181 Sepsis due to Enterococcus: Secondary | ICD-10-CM | POA: Diagnosis not present

## 2018-06-24 DIAGNOSIS — I2510 Atherosclerotic heart disease of native coronary artery without angina pectoris: Secondary | ICD-10-CM | POA: Diagnosis not present

## 2018-06-24 DIAGNOSIS — T827XXA Infection and inflammatory reaction due to other cardiac and vascular devices, implants and grafts, initial encounter: Secondary | ICD-10-CM | POA: Diagnosis not present

## 2018-06-24 DIAGNOSIS — I110 Hypertensive heart disease with heart failure: Secondary | ICD-10-CM | POA: Diagnosis not present

## 2018-06-24 DIAGNOSIS — N179 Acute kidney failure, unspecified: Secondary | ICD-10-CM | POA: Diagnosis not present

## 2018-06-24 DIAGNOSIS — R6520 Severe sepsis without septic shock: Secondary | ICD-10-CM | POA: Diagnosis not present

## 2018-06-24 DIAGNOSIS — I5042 Chronic combined systolic (congestive) and diastolic (congestive) heart failure: Secondary | ICD-10-CM | POA: Diagnosis not present

## 2018-06-24 DIAGNOSIS — Z452 Encounter for adjustment and management of vascular access device: Secondary | ICD-10-CM | POA: Diagnosis not present

## 2018-06-24 DIAGNOSIS — I428 Other cardiomyopathies: Secondary | ICD-10-CM | POA: Diagnosis not present

## 2018-06-24 DIAGNOSIS — C8400 Mycosis fungoides, unspecified site: Secondary | ICD-10-CM | POA: Diagnosis not present

## 2018-06-25 ENCOUNTER — Other Ambulatory Visit: Payer: Self-pay | Admitting: Endocrinology

## 2018-06-25 ENCOUNTER — Other Ambulatory Visit: Payer: Self-pay | Admitting: Family Medicine

## 2018-06-25 NOTE — Telephone Encounter (Signed)
Please review

## 2018-06-27 NOTE — Telephone Encounter (Signed)
Last OV 06/22/18, Next OV 07/06/18  Last filled 03/28/18, # 90 with 0 refills  Continue to refill?

## 2018-06-27 NOTE — Telephone Encounter (Signed)
Refill once 

## 2018-06-28 ENCOUNTER — Telehealth: Payer: Self-pay | Admitting: Family Medicine

## 2018-06-28 NOTE — Telephone Encounter (Signed)
Called patient and LMOVM to return call  [place] for [facility] to Discuss results / PCP / recommendations / Schedule patient  [place] and gave the OK per Dr Elease Hashimoto to continue services.  CRM Created.

## 2018-06-28 NOTE — Telephone Encounter (Signed)
ok 

## 2018-06-28 NOTE — Telephone Encounter (Signed)
Copied from [place] [phone]. Topic: Inquiry >> Jun 28, 2018 12:50 PM Oliver Pila B wrote: Reason for CRM: [facility] called to orders for continuation of services; contact [phone]; leave message if needed add extension to message

## 2018-06-28 NOTE — Telephone Encounter (Signed)
Please advise 

## 2018-06-29 DIAGNOSIS — R6520 Severe sepsis without septic shock: Secondary | ICD-10-CM | POA: Diagnosis not present

## 2018-06-29 DIAGNOSIS — E1151 Type 2 diabetes mellitus with diabetic peripheral angiopathy without gangrene: Secondary | ICD-10-CM | POA: Diagnosis not present

## 2018-06-29 DIAGNOSIS — T827XXA Infection and inflammatory reaction due to other cardiac and vascular devices, implants and grafts, initial encounter: Secondary | ICD-10-CM | POA: Diagnosis not present

## 2018-06-29 DIAGNOSIS — A4181 Sepsis due to Enterococcus: Secondary | ICD-10-CM | POA: Diagnosis not present

## 2018-06-29 DIAGNOSIS — I5042 Chronic combined systolic (congestive) and diastolic (congestive) heart failure: Secondary | ICD-10-CM | POA: Diagnosis not present

## 2018-06-29 DIAGNOSIS — E785 Hyperlipidemia, unspecified: Secondary | ICD-10-CM | POA: Diagnosis not present

## 2018-06-29 DIAGNOSIS — E1142 Type 2 diabetes mellitus with diabetic polyneuropathy: Secondary | ICD-10-CM | POA: Diagnosis not present

## 2018-06-30 ENCOUNTER — Telehealth: Payer: Self-pay

## 2018-06-30 ENCOUNTER — Ambulatory Visit (INDEPENDENT_AMBULATORY_CARE_PROVIDER_SITE_OTHER): Payer: Medicare HMO

## 2018-06-30 DIAGNOSIS — Z95810 Presence of automatic (implantable) cardiac defibrillator: Secondary | ICD-10-CM

## 2018-06-30 DIAGNOSIS — I5042 Chronic combined systolic (congestive) and diastolic (congestive) heart failure: Secondary | ICD-10-CM | POA: Diagnosis not present

## 2018-06-30 NOTE — Telephone Encounter (Signed)
Spoke with pt and reminded pt of remote transmission that is due today. Pt verbalized understanding.   

## 2018-07-01 ENCOUNTER — Telehealth: Payer: Self-pay | Admitting: *Deleted

## 2018-07-01 DIAGNOSIS — I4729 Other ventricular tachycardia: Secondary | ICD-10-CM

## 2018-07-01 DIAGNOSIS — I472 Ventricular tachycardia: Secondary | ICD-10-CM

## 2018-07-01 MED ORDER — CARVEDILOL 25 MG PO TABS: 37.5000 mg | ORAL_TABLET | Freq: Two times a day (BID) | ORAL | 3 refills | Status: DC

## 2018-07-01 NOTE — Progress Notes (Signed)
Thank you.  I also saw the rhythm abnormalities and gave the device clinic some instructions.  Have a good weekend

## 2018-07-01 NOTE — Progress Notes (Signed)
EPIC Encounter for ICM Monitoring  Patient Name: Eric Price is a 67 y.o. male Date: 07/01/2018 Primary Care Physican: [name], MD Primary Cardiologist:Berry Electrophysiologist:Croitoru Dry Weight:Previous [id number] lbs     Monitored Since 23-May-2018 VT (162-200 bpm)                   16 VT-NS (>4 beats, >200 bpm)   1      Attempted call to patient and unable to reach.  Left detailed message, per DPR, regarding transmission.  Transmission reviewed.   Monitored VT addressed by device clinic triage nurse (see phone note).   Thoracic impedance normal.  Prescribed: Furosemide80 mg take 1 tablet daily  Labs: 06/15/2018 Creatinine 1.77, BUN 30, Potassium 4.3, Sodium 135, EGFR 49.45 04/18/2018 Creatinine 1.22, BUN 15, Potassium 4.4, Sodium 139, EGFR 76.01 04/01/2018 Creatinine 1.49, BUN 25, Potassium 4.3, Sodium 140, EGFR 6036 03/13/2018 Creatinine1.03, BUN9, Potassium3.9, [id number], EGFR>60 03/11/2018 Creatinine1.00, BUN11, Potassium3.9, Sodium140, EGFR>60  03/10/2018 Creatinine1.16, BUN14, Potassium3.8, Sodium141, EGFR>60  03/09/2018 Creatinine1.33, BUN19, Potassium3.9, Sodium138, EGFR54->60  03/08/2018 Creatinine1.96, BUN27, Potassium4.0, Sodium134, [id number] @ 3:49 AM  03/08/2018 Creatinine1.96, BUN27, Potassium4.0, Sodium134, NGEX52-84 @ 1:15 AM 11/25/2017 Creatinine 1.10, BUN 23, Potassium 4.8, Sodium 138, EGFR 69-80 11/23/2017 Creatinine 1.19, BUN 29, Potassium 4.3, Sodium 140, EGFR 63-73  Recommendations: Left voice mail with ICM number and encouraged to call if experiencing any fluid symptoms.  Follow-up plan: ICM clinic phone appointment on 08/01/2018.    Copy of ICM check sent to Dr. Sallyanne [facility].   3 month ICM trend: 06/30/2018    1 Year ICM trend:       Rosalene Billings, RN 07/01/2018 12:24 PM

## 2018-07-01 NOTE — Telephone Encounter (Signed)
My suspicion is that those episodes are VT, although it is unusual how the cycle length gradually accelerates.  Onset and termination seemed to be fairly abrupt.  However, the rate is not particularly fast and as he is asymptomatic we probably do not want him to receive therapy for this. I do not think the metformin has much to do with it. Please ask him to increase his carvedilol to 37.5 mg twice daily and set him up to have a be met and magnesium check. Do another download for rhythm in a week. Thanks, [employer]

## 2018-07-01 NOTE — Telephone Encounter (Signed)
Spoke with patient to advise him of Dr. Victorino December recommendations. He is agreeable to increasing carvedilol to 37.5mg  (1.5tab) BID. He has a home BP cuff and is aware to monitor for side effects with dose increase. Prescription sent electronically to [facility] at patient's request.   Patient is due for labwork at [facility] on Monday, so he will request that they draw a BMET and Mg there and fax the results to [place]. If he is unable to get them done there, he will call us to schedule next week. Patient agrees to send a transmission next week. Added to remotes schedule, will LM with instructions per patient request. He verbalizes appreciation of information and denies additional questions at this time.  LMOVM for patient with [facility] fax number for lab work and manual [facility] transmission instructions. [place] phone number for questions/concerns.

## 2018-07-01 NOTE — Telephone Encounter (Signed)
Reviewed ICM transmission from 07/01/18 at request of Sharman Cheek, RN. 16 monitored VT episodes--available EGMs show both gradual onset and sudden onset, near-field morphology appears similar to sinus, though Wavelet only correlating at ~52-68% prior to initial detection. No treated episodes. See example EGMs below.  Patient denies any symptoms with episodes. No recent presyncope/syncope. Taking all cardiac meds as prescribed. Patient reports metformin on hold for a few weeks per another MD's instructions. Advised patient I will forward information to Dr. Sallyanne Kuster and call back if any recommendations. Patient is agreeable to plan and appreciative of call.

## 2018-07-04 ENCOUNTER — Other Ambulatory Visit: Payer: Self-pay | Admitting: Cardiovascular Disease

## 2018-07-04 DIAGNOSIS — Z6832 Body mass index (BMI) 32.0-32.9, adult: Secondary | ICD-10-CM | POA: Diagnosis not present

## 2018-07-04 DIAGNOSIS — Z5111 Encounter for antineoplastic chemotherapy: Secondary | ICD-10-CM | POA: Diagnosis not present

## 2018-07-04 DIAGNOSIS — R110 Nausea: Secondary | ICD-10-CM | POA: Diagnosis not present

## 2018-07-04 DIAGNOSIS — R634 Abnormal weight loss: Secondary | ICD-10-CM | POA: Diagnosis not present

## 2018-07-04 DIAGNOSIS — Z713 Dietary counseling and surveillance: Secondary | ICD-10-CM | POA: Diagnosis not present

## 2018-07-04 DIAGNOSIS — Z23 Encounter for immunization: Secondary | ICD-10-CM | POA: Diagnosis not present

## 2018-07-04 DIAGNOSIS — M25552 Pain in left hip: Secondary | ICD-10-CM | POA: Diagnosis not present

## 2018-07-04 DIAGNOSIS — R6881 Early satiety: Secondary | ICD-10-CM | POA: Diagnosis not present

## 2018-07-04 DIAGNOSIS — C84A0 Cutaneous T-cell lymphoma, unspecified, unspecified site: Secondary | ICD-10-CM | POA: Diagnosis not present

## 2018-07-04 DIAGNOSIS — C8400 Mycosis fungoides, unspecified site: Secondary | ICD-10-CM | POA: Diagnosis not present

## 2018-07-04 DIAGNOSIS — C8405 Mycosis fungoides, lymph nodes of inguinal region and lower limb: Secondary | ICD-10-CM | POA: Diagnosis not present

## 2018-07-04 DIAGNOSIS — Z923 Personal history of irradiation: Secondary | ICD-10-CM | POA: Diagnosis not present

## 2018-07-04 NOTE — Telephone Encounter (Signed)
Is unclear to me whether or not this is VT or SVT.  Fortunately they are short episodes.  If he does have a history of cardiomyopathy I think it safe to assume that it is VT.  That being said, morphology does not look similar to normal rhythm.

## 2018-07-04 NOTE — Telephone Encounter (Signed)
Thanks, Will 

## 2018-07-05 DIAGNOSIS — C8400 Mycosis fungoides, unspecified site: Secondary | ICD-10-CM | POA: Diagnosis not present

## 2018-07-05 DIAGNOSIS — Z5111 Encounter for antineoplastic chemotherapy: Secondary | ICD-10-CM | POA: Diagnosis not present

## 2018-07-06 ENCOUNTER — Encounter: Payer: Self-pay | Admitting: Family Medicine

## 2018-07-06 ENCOUNTER — Ambulatory Visit (INDEPENDENT_AMBULATORY_CARE_PROVIDER_SITE_OTHER): Payer: Medicare HMO | Admitting: Family Medicine

## 2018-07-06 VITALS — BP 110/54 | HR 84 | Temp 97.6°F | Wt 218.9 lb

## 2018-07-06 DIAGNOSIS — I10 Essential (primary) hypertension: Secondary | ICD-10-CM | POA: Diagnosis not present

## 2018-07-06 DIAGNOSIS — I428 Other cardiomyopathies: Secondary | ICD-10-CM

## 2018-07-06 DIAGNOSIS — I5042 Chronic combined systolic (congestive) and diastolic (congestive) heart failure: Secondary | ICD-10-CM

## 2018-07-06 DIAGNOSIS — N179 Acute kidney failure, unspecified: Secondary | ICD-10-CM | POA: Diagnosis not present
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
## 2018-07-06 NOTE — Patient Instructions (Signed)
Continue to hold the Lisinopril for now  Increase Lasix back to 80 mg twice daily.for three days and then drop back to 80 mg and 40 mg  Elevated leg frequently.

## 2018-07-06 NOTE — Telephone Encounter (Signed)
Spoke with patient to request copies of lab work. Patient saw Dr. Elease Hashimoto today and gave him copies of his lab work. Advised patient I will check back tomorrow to see if lab work was scanned in. He is also aware that his f/u transmission for review of episodes is scheduled for tomorrow and should be automatic. Patient is aware and denies questions at this time.  Called Dr. Erick Blinks office to see which labs patient had drawn earlier this week. Per receptionist, they have a CBC, chem profile, and "DM differential". Labs may not be scanned in for awhile. Requested that they fax this information to [facility] office, gave fax number. She agrees to send.

## 2018-07-07 ENCOUNTER — Telehealth: Payer: Self-pay | Admitting: Cardiology

## 2018-07-07 ENCOUNTER — Ambulatory Visit (INDEPENDENT_AMBULATORY_CARE_PROVIDER_SITE_OTHER): Payer: Medicare HMO | Admitting: *Deleted

## 2018-07-07 ENCOUNTER — Telehealth: Payer: Self-pay | Admitting: Family Medicine

## 2018-07-07 DIAGNOSIS — I469 Cardiac arrest, cause unspecified: Secondary | ICD-10-CM

## 2018-07-07 DIAGNOSIS — I5042 Chronic combined systolic (congestive) and diastolic (congestive) heart failure: Secondary | ICD-10-CM

## 2018-07-07 NOTE — Telephone Encounter (Signed)
Copied from [place] [id number]. Topic: Quick Communication - See Telephone Encounter >> Jul 07, 2018  4:54 PM Blase Mess A wrote: CRM for notification. See Telephone encounter for: 07/07/18. Raquel Sarna from [facility] is requesting a copy of the outside [facility] that patient brought in for Dr. Elease Hashimoto yesterday.  [facility] is requesting the information to be faxed [phone]

## 2018-07-07 NOTE — Telephone Encounter (Signed)
Copied from [place] [id number]. Topic: Quick Communication - See Telephone Encounter >> Jul 07, 2018  4:06 PM Antonieta Iba C wrote: CRM for notification. See Telephone encounter for: 07/07/18.  Madelyn Flavors- PT w/ [facility]   Requesting vo for PT   Frequency:   1 time a week for  1 week   2 times a week for 3 weeks    CB: [phone]

## 2018-07-07 NOTE — Telephone Encounter (Signed)
Spoke with pt and reminded pt of remote transmission that is due today. Pt verbalized understanding.   

## 2018-07-07 NOTE — Telephone Encounter (Signed)
Thank you MCr 

## 2018-07-07 NOTE — Telephone Encounter (Signed)
1 week f/u Carelink transmission received and reviewed. 1 VT-NS episode, ~12 beats per markers/EGM. No other episodes noted since 07/01/18.  Lab work not received from Dr. Erick Blinks office--could be up to 3 days until they are faxed per receptionist.   Spoke with patient and wife. Patient is taking increased dose of carvedilol as instructed. Patient had equivalent of BMET drawn on Monday, agrees to come in tomorrow for Mag level, order entered. He will bring copy of [facility] lab work with him to be scanned in. Patient and wife appreciative of assistance and deny questions or concerns at this time.

## 2018-07-08 ENCOUNTER — Emergency Department (HOSPITAL_COMMUNITY): Payer: Medicare HMO

## 2018-07-08 ENCOUNTER — Emergency Department (HOSPITAL_COMMUNITY)
Admission: EM | Admit: 2018-07-08 | Discharge: 2018-07-08 | Disposition: A | Discharge status: Home / Self Care | Payer: Medicare HMO | Attending: Emergency Medicine | Admitting: Emergency Medicine

## 2018-07-08 ENCOUNTER — Telehealth: Payer: Self-pay | Admitting: Cardiovascular Disease

## 2018-07-08 ENCOUNTER — Other Ambulatory Visit: Payer: Medicare HMO | Admitting: *Deleted

## 2018-07-08 ENCOUNTER — Encounter (HOSPITAL_COMMUNITY): Payer: Self-pay

## 2018-07-08 ENCOUNTER — Telehealth: Payer: Self-pay | Admitting: Internal Medicine

## 2018-07-08 DIAGNOSIS — I2510 Atherosclerotic heart disease of native coronary artery without angina pectoris: Secondary | ICD-10-CM | POA: Diagnosis not present

## 2018-07-08 DIAGNOSIS — E119 Type 2 diabetes mellitus without complications: Secondary | ICD-10-CM | POA: Insufficient documentation

## 2018-07-08 DIAGNOSIS — I5042 Chronic combined systolic (congestive) and diastolic (congestive) heart failure: Secondary | ICD-10-CM | POA: Diagnosis not present

## 2018-07-08 DIAGNOSIS — R531 Weakness: Secondary | ICD-10-CM

## 2018-07-08 DIAGNOSIS — Z794 Long term (current) use of insulin: Secondary | ICD-10-CM | POA: Insufficient documentation

## 2018-07-08 DIAGNOSIS — I110 Hypertensive heart disease with heart failure: Secondary | ICD-10-CM | POA: Insufficient documentation

## 2018-07-08 DIAGNOSIS — Z79899 Other long term (current) drug therapy: Secondary | ICD-10-CM | POA: Diagnosis not present

## 2018-07-08 DIAGNOSIS — I10 Essential (primary) hypertension: Secondary | ICD-10-CM | POA: Diagnosis not present

## 2018-07-08 DIAGNOSIS — Z7901 Long term (current) use of anticoagulants: Secondary | ICD-10-CM | POA: Diagnosis not present

## 2018-07-08 DIAGNOSIS — R42 Dizziness and giddiness: Secondary | ICD-10-CM | POA: Diagnosis not present

## 2018-07-08 DIAGNOSIS — Z95810 Presence of automatic (implantable) cardiac defibrillator: Secondary | ICD-10-CM | POA: Diagnosis not present

## 2018-07-08 DIAGNOSIS — I472 Ventricular tachycardia: Secondary | ICD-10-CM

## 2018-07-08 DIAGNOSIS — I4729 Other ventricular tachycardia: Secondary | ICD-10-CM

## 2018-07-08 LAB — URINALYSIS, ROUTINE W REFLEX MICROSCOPIC
BILIRUBIN URINE: NEGATIVE
Glucose, UA: 50 mg/dL — AB
Hgb urine dipstick: NEGATIVE
KETONES UR: NEGATIVE mg/dL
LEUKOCYTES UA: NEGATIVE
NITRITE: NEGATIVE
PH: 5 (ref 5.0–8.0)
PROTEIN: NEGATIVE mg/dL
Specific Gravity, Urine: 1.003 — ABNORMAL LOW (ref 1.005–1.030)

## 2018-07-08 LAB — CBC
HCT: 33.9 % — ABNORMAL LOW (ref 39.0–52.0)
Hemoglobin: 10.8 g/dL — ABNORMAL LOW (ref 13.0–17.0)
MCH: 26.7 pg (ref 26.0–34.0)
MCHC: 31.9 g/dL (ref 30.0–36.0)
MCV: 83.7 fL (ref 78.0–100.0)
PLATELETS: 237 10*3/uL (ref 150–400)
RBC: 4.05 MIL/uL — ABNORMAL LOW (ref 4.22–5.81)
RDW: 13.9 % (ref 11.5–15.5)
WBC: 22.4 10*3/uL — AB (ref 4.0–10.5)

## 2018-07-08 LAB — CBC WITH DIFFERENTIAL/PLATELET
BASOS ABS: 0.2 10*3/uL — AB (ref 0.0–0.1)
Basophils Relative: 1 %
Eosinophils Absolute: 0.2 10*3/uL (ref 0.0–0.7)
Eosinophils Relative: 1 %
HCT: 34.2 % — ABNORMAL LOW (ref 39.0–52.0)
HEMOGLOBIN: 11 g/dL — AB (ref 13.0–17.0)
LYMPHS ABS: 0 10*3/uL — AB (ref 0.7–4.0)
Lymphocytes Relative: 0 %
MCH: 27.1 pg (ref 26.0–34.0)
MCHC: 32.2 g/dL (ref 30.0–36.0)
MCV: 84.2 fL (ref 78.0–100.0)
MONOS PCT: 5 %
Monocytes Absolute: 1.1 10*3/uL — ABNORMAL HIGH (ref 0.1–1.0)
NEUTROS PCT: 93 %
Neutro Abs: 20.2 10*3/uL — ABNORMAL HIGH (ref 1.7–7.7)
Platelets: 251 10*3/uL (ref 150–400)
RBC: 4.06 MIL/uL — AB (ref 4.22–5.81)
RDW: 14 % (ref 11.5–15.5)
WBC Morphology: INCREASED
WBC: 21.7 10*3/uL — AB (ref 4.0–10.5)

## 2018-07-08 LAB — BASIC METABOLIC PANEL
Anion gap: 12 (ref 5–15)
BUN: 23 mg/dL (ref 8–23)
CALCIUM: 9.1 mg/dL (ref 8.9–10.3)
CO2: 25 mmol/L (ref 22–32)
CREATININE: 1.4 mg/dL — AB (ref 0.61–1.24)
Chloride: 99 mmol/L (ref 98–111)
GFR calc Af Amer: 58 mL/min — ABNORMAL LOW (ref >60)
GFR, EST NON AFRICAN AMERICAN: 50 mL/min — AB (ref >60)
GLUCOSE: 139 mg/dL — AB (ref 70–99)
Potassium: 3.4 mmol/L — ABNORMAL LOW (ref 3.5–5.1)
Sodium: 136 mmol/L (ref 135–145)

## 2018-07-08 LAB — HEPATIC FUNCTION PANEL
ALK PHOS: 95 U/L (ref 38–126)
ALT: 28 U/L (ref 0–44)
AST: 24 U/L (ref 15–41)
Albumin: 3.7 g/dL (ref 3.5–5.0)
BILIRUBIN INDIRECT: 0.4 mg/dL (ref 0.3–0.9)
Bilirubin, Direct: 0.1 mg/dL (ref 0.0–0.2)
TOTAL PROTEIN: 6.4 g/dL — AB (ref 6.5–8.1)
Total Bilirubin: 0.5 mg/dL (ref 0.3–1.2)

## 2018-07-08 LAB — CBG MONITORING, ED: GLUCOSE-CAPILLARY: 137 mg/dL — AB (ref 70–99)

## 2018-07-08 LAB — I-STAT TROPONIN, ED: Troponin i, poc: 0 ng/mL (ref 0.00–0.08)

## 2018-07-08 LAB — PHOSPHORUS: PHOSPHORUS: 3.1 mg/dL (ref 2.5–4.6)

## 2018-07-08 LAB — MAGNESIUM
MAGNESIUM: 1.5 mg/dL — AB (ref 1.7–2.4)
Magnesium: 1.5 mg/dL — ABNORMAL LOW (ref 1.6–2.3)

## 2018-07-08 MED ORDER — POTASSIUM CHLORIDE 20 MEQ PO PACK
40.0000 meq | PACK | Freq: Once | ORAL | Status: AC
  Administered 2018-07-08: 40 meq via ORAL
  Filled 2018-07-08: qty 2

## 2018-07-08 MED ORDER — LACTATED RINGERS IV BOLUS
1000.0000 mL | Freq: Once | INTRAVENOUS | Status: AC
  Administered 2018-07-08: 1000 mL via INTRAVENOUS

## 2018-07-08 NOTE — Telephone Encounter (Signed)
Pt did not give last lab work to Dr. Elease Hashimoto.  They are available in [place] from 07/04/18/[facility].  [facility] notified by telephone and a message should be routed to [place].

## 2018-07-08 NOTE — ED Provider Notes (Signed)
Patient complains of generalized weakness onset 2 days ago.  His wife reports that his blood sugar was 600 earlier today.  He ate cereal and a [place] sandwich earlier today.  He denies pain anywhere.  He last had chemotherapy 2 days ago.  On exam nontoxic alert lungs clear to auscultation heart regular rate and rhythm abdomen obese nontender left lower extremity with BKA right lower extremity nontender, neurovascular intact.  Skin warm dry   Orlie Dakin, MD 07/08/18 1740

## 2018-07-08 NOTE — Telephone Encounter (Signed)
Please see other phone note. Duplicate encounter.

## 2018-07-08 NOTE — Progress Notes (Signed)
Remote ICD transmission.   

## 2018-07-08 NOTE — Telephone Encounter (Signed)
Patient is stating that blood sugar is running high. Up to 600 and down to 512. Ph # [phone]

## 2018-07-08 NOTE — Telephone Encounter (Signed)
At last visit, he was doing wonderful, and even he developed low blood sugars.  I am not sure what happened... In the light of this significant change, it is probably better to go to the hospital.  Please asked him to call us to tell tell us how he is doing after the weekend.

## 2018-07-08 NOTE — Telephone Encounter (Signed)
Mistey @ [place] called to let Raquel Sarna in the Device clinic know that they do not have hard copies of the labs that [facility] requested but they are available in care everywhere. Adv Misty that I will fwd the message.

## 2018-07-08 NOTE — Telephone Encounter (Signed)
OK 

## 2018-07-08 NOTE — ED Triage Notes (Signed)
Pt from home via ems; L BKA; originally called out for hyperglycemia (glucometer un-calibrated, approx 50 off); pt c/o not feeling well for past few days; getting chemo, last treatment Tuesday;  Has packmaker,/defibrillator; c/o feeling worse over fast few days; negative orthostatics, dizziness upon standing; 12 lead unremarkable; hx MI, diabetes, htn  116/82 HR 80 RR 14 98% RA CBG 250

## 2018-07-08 NOTE — ED Notes (Signed)
[employer] report given to Dr. Winfred Leeds

## 2018-07-08 NOTE — Telephone Encounter (Signed)
Eric Price with [place] and gave her the verbal OK per Dr. Elease Hashimoto.

## 2018-07-08 NOTE — ED Notes (Signed)
Patient verbalizes understanding of discharge instructions. Opportunity for questioning and answers were provided. Armband removed by staff, pt discharged from ED to lobby with spouse in wheelchair.

## 2018-07-08 NOTE — Telephone Encounter (Signed)
Spoke to patient's wife, she called his oncologist who advised going to the ER, she has called 911 and they are on their way.  FYI to Dr. Cruzita Lederer

## 2018-07-08 NOTE — Telephone Encounter (Signed)
Pt's blood sugar has gotten out of control.

## 2018-07-08 NOTE — Discharge Instructions (Signed)
Follow-up with primary care doctor in 2 days.  Return to ED for any worsening or other concerns.  Drink plenty fluids.

## 2018-07-08 NOTE — ED Notes (Signed)
Patient transported to X-ray 

## 2018-07-08 NOTE — Telephone Encounter (Signed)
Please advise 
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
## 2018-07-08 NOTE — Telephone Encounter (Signed)
New message    Eric Price is returning call lab results.

## 2018-07-08 NOTE — Addendum Note (Signed)
Addended by: Tiajuana Amass on: 07/08/2018 02:47 PM   Modules accepted: Level of Service

## 2018-07-09 LAB — URINE CULTURE: CULTURE: NO GROWTH

## 2018-07-11 ENCOUNTER — Telehealth: Payer: Self-pay | Admitting: Internal Medicine

## 2018-07-11 NOTE — Telephone Encounter (Signed)
Patient is calling wanting to speak with nurse about high blood sugar, Please Advise [place] # [phone]

## 2018-07-11 NOTE — Telephone Encounter (Signed)
Left message for patient to return our call at [phone].  

## 2018-07-11 NOTE — Telephone Encounter (Signed)
Spoke with patient and his wife, blood sugars over the weekend were all over and they did mention he started a new chemo a week ago. F/u made for tomorrow.

## 2018-07-12 ENCOUNTER — Ambulatory Visit (INDEPENDENT_AMBULATORY_CARE_PROVIDER_SITE_OTHER): Payer: Medicare HMO | Admitting: Internal Medicine

## 2018-07-12 ENCOUNTER — Ambulatory Visit: Payer: Medicare HMO

## 2018-07-12 ENCOUNTER — Ambulatory Visit: Payer: Medicare HMO | Admitting: Internal Medicine

## 2018-07-12 ENCOUNTER — Encounter: Payer: Self-pay | Admitting: Internal Medicine

## 2018-07-12 VITALS — BP 120/60 | HR 107 | Ht 69.0 in | Wt 211.0 lb

## 2018-07-12 DIAGNOSIS — I2510 Atherosclerotic heart disease of native coronary artery without angina pectoris: Secondary | ICD-10-CM | POA: Diagnosis not present

## 2018-07-12 DIAGNOSIS — Z23 Encounter for immunization: Secondary | ICD-10-CM | POA: Diagnosis not present

## 2018-07-12 DIAGNOSIS — A4181 Sepsis due to Enterococcus: Secondary | ICD-10-CM | POA: Diagnosis not present

## 2018-07-12 DIAGNOSIS — I5042 Chronic combined systolic (congestive) and diastolic (congestive) heart failure: Secondary | ICD-10-CM | POA: Diagnosis not present

## 2018-07-12 DIAGNOSIS — E1165 Type 2 diabetes mellitus with hyperglycemia: Secondary | ICD-10-CM

## 2018-07-12 DIAGNOSIS — Z452 Encounter for adjustment and management of vascular access device: Secondary | ICD-10-CM | POA: Diagnosis not present

## 2018-07-12 DIAGNOSIS — I110 Hypertensive heart disease with heart failure: Secondary | ICD-10-CM | POA: Diagnosis not present

## 2018-07-12 DIAGNOSIS — E782 Mixed hyperlipidemia: Secondary | ICD-10-CM | POA: Diagnosis not present

## 2018-07-12 DIAGNOSIS — T827XXA Infection and inflammatory reaction due to other cardiac and vascular devices, implants and grafts, initial encounter: Secondary | ICD-10-CM | POA: Diagnosis not present

## 2018-07-12 DIAGNOSIS — E1159 Type 2 diabetes mellitus with other circulatory complications: Secondary | ICD-10-CM | POA: Diagnosis not present

## 2018-07-12 DIAGNOSIS — C8400 Mycosis fungoides, unspecified site: Secondary | ICD-10-CM | POA: Diagnosis not present

## 2018-07-12 DIAGNOSIS — N179 Acute kidney failure, unspecified: Secondary | ICD-10-CM | POA: Diagnosis not present

## 2018-07-12 DIAGNOSIS — I428 Other cardiomyopathies: Secondary | ICD-10-CM | POA: Diagnosis not present

## 2018-07-12 DIAGNOSIS — R6520 Severe sepsis without septic shock: Secondary | ICD-10-CM | POA: Diagnosis not present

## 2018-07-12 LAB — POCT GLYCOSYLATED HEMOGLOBIN (HGB A1C): Hemoglobin A1C: 7.3 % — AB (ref 4.0–5.6)

## 2018-07-12 MED ORDER — INSULIN ASPART 100 UNIT/ML FLEXPEN: PEN_INJECTOR | SUBCUTANEOUS | 0 refills | Status: DC

## 2018-07-12 MED ORDER — METFORMIN HCL 1000 MG PO TABS: 1000.0000 mg | ORAL_TABLET | Freq: Every day | ORAL | 3 refills | Status: DC

## 2018-07-12 MED ORDER — GLUCOSE BLOOD VI STRP: ORAL_STRIP | 3 refills | Status: DC

## 2018-07-12 NOTE — Addendum Note (Signed)
Addended by: Cardell Peach I on: 07/12/2018 02:28 PM   Modules accepted: Orders

## 2018-07-12 NOTE — Patient Instructions (Addendum)
Please continue: - Trulicity 1.5 mg weekly - Novolog 20-28 units before meals  Please restart: - Metformin 1000 mg with dinner  Please increase: - Levemir to 40 units 2x a day (for 2 days, use 30 units 2x a day)  Please return in 3 months with your sugar log.
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
## 2018-07-13 ENCOUNTER — Telehealth: Payer: Self-pay | Admitting: Family Medicine

## 2018-07-13 LAB — CULTURE, BLOOD (ROUTINE X 2)
CULTURE: NO GROWTH
Culture: NO GROWTH
Special Requests: ADEQUATE

## 2018-07-13 NOTE — Telephone Encounter (Signed)
Ok

## 2018-07-13 NOTE — Telephone Encounter (Signed)
Please advise 

## 2018-07-13 NOTE — Telephone Encounter (Signed)
Copied from [place] [id number]. Topic: General - Other >> Jul 13, 2018  3:23 PM Cecelia Byars, NT wrote: Reason for CRM: [facility] from  [facility] called requesting reverification  would like continuous medication teaching ,diabetes teaching  ,and monitoring ,and requesting  orders for 1 time a week for 9 weeks ,and also to change the pick the line dressing , 1 time a week for 9 weeks unless he is seen by [facility] , 1 day eval for P.T , please call her at , [phone]  ok to leave a detailed message

## 2018-07-13 NOTE — Telephone Encounter (Signed)
Called [place] and gave her verbal OK from Dr. Elease Hashimoto.

## 2018-07-16 LAB — CUP PACEART REMOTE DEVICE CHECK
Battery Voltage: 3 V
Date Time Interrogation Session: 20191003145922
HighPow Impedance: 62 Ohm
Implantable Lead Implant Date: 20161122
Implantable Lead Location: 753860
Implantable Pulse Generator Implant Date: 20161122
Lead Channel Pacing Threshold Amplitude: 0.625 V
Lead Channel Pacing Threshold Pulse Width: 0.4 ms
Lead Channel Sensing Intrinsic Amplitude: 7 mV
Lead Channel Setting Pacing Amplitude: 2.5 V
Lead Channel Setting Sensing Sensitivity: 0.3 mV
MDC IDC MSMT BATTERY REMAINING LONGEVITY: 117 mo
MDC IDC MSMT LEADCHNL RV IMPEDANCE VALUE: 285 Ohm
MDC IDC MSMT LEADCHNL RV IMPEDANCE VALUE: 323 Ohm
MDC IDC MSMT LEADCHNL RV SENSING INTR AMPL: 7 mV
MDC IDC SET LEADCHNL RV PACING PULSEWIDTH: 0.4 ms
MDC IDC STAT BRADY RV PERCENT PACED: 0 %

## 2018-07-17 ENCOUNTER — Other Ambulatory Visit: Payer: Self-pay | Admitting: Internal Medicine

## 2018-07-17 ENCOUNTER — Other Ambulatory Visit: Payer: Self-pay | Admitting: Family Medicine

## 2018-07-19 DIAGNOSIS — T827XXA Infection and inflammatory reaction due to other cardiac and vascular devices, implants and grafts, initial encounter: Secondary | ICD-10-CM | POA: Diagnosis not present

## 2018-07-19 DIAGNOSIS — R6520 Severe sepsis without septic shock: Secondary | ICD-10-CM | POA: Diagnosis not present

## 2018-07-19 DIAGNOSIS — E1142 Type 2 diabetes mellitus with diabetic polyneuropathy: Secondary | ICD-10-CM | POA: Diagnosis not present

## 2018-07-19 DIAGNOSIS — A4181 Sepsis due to Enterococcus: Secondary | ICD-10-CM | POA: Diagnosis not present

## 2018-07-19 DIAGNOSIS — E1151 Type 2 diabetes mellitus with diabetic peripheral angiopathy without gangrene: Secondary | ICD-10-CM | POA: Diagnosis not present

## 2018-07-19 DIAGNOSIS — I5042 Chronic combined systolic (congestive) and diastolic (congestive) heart failure: Secondary | ICD-10-CM | POA: Diagnosis not present

## 2018-07-19 DIAGNOSIS — E785 Hyperlipidemia, unspecified: Secondary | ICD-10-CM | POA: Diagnosis not present

## 2018-07-20 DIAGNOSIS — I428 Other cardiomyopathies: Secondary | ICD-10-CM | POA: Diagnosis not present

## 2018-07-20 DIAGNOSIS — R6520 Severe sepsis without septic shock: Secondary | ICD-10-CM | POA: Diagnosis not present

## 2018-07-20 DIAGNOSIS — A4181 Sepsis due to Enterococcus: Secondary | ICD-10-CM | POA: Diagnosis not present

## 2018-07-20 DIAGNOSIS — C8400 Mycosis fungoides, unspecified site: Secondary | ICD-10-CM | POA: Diagnosis not present

## 2018-07-20 DIAGNOSIS — Z452 Encounter for adjustment and management of vascular access device: Secondary | ICD-10-CM | POA: Diagnosis not present

## 2018-07-20 DIAGNOSIS — T827XXA Infection and inflammatory reaction due to other cardiac and vascular devices, implants and grafts, initial encounter: Secondary | ICD-10-CM | POA: Diagnosis not present

## 2018-07-20 DIAGNOSIS — I5042 Chronic combined systolic (congestive) and diastolic (congestive) heart failure: Secondary | ICD-10-CM | POA: Diagnosis not present

## 2018-07-20 DIAGNOSIS — N179 Acute kidney failure, unspecified: Secondary | ICD-10-CM | POA: Diagnosis not present

## 2018-07-20 DIAGNOSIS — I110 Hypertensive heart disease with heart failure: Secondary | ICD-10-CM | POA: Diagnosis not present

## 2018-07-20 DIAGNOSIS — I2510 Atherosclerotic heart disease of native coronary artery without angina pectoris: Secondary | ICD-10-CM | POA: Diagnosis not present

## 2018-07-22 ENCOUNTER — Telehealth: Payer: Self-pay | Admitting: *Deleted

## 2018-07-22 NOTE — Telephone Encounter (Signed)
FYI

## 2018-07-22 NOTE — Telephone Encounter (Signed)
Copied from [place] [id number]. Topic: General - Other >> Jul 22, 2018  2:00 PM Yvette Rack wrote: Reason for CRM: Merrilee Seashore with [facility] states pt had to cancel appt for today 07/22/18 due to his wife being sick. Cb# [phone]

## 2018-07-25 ENCOUNTER — Other Ambulatory Visit: Payer: Self-pay | Admitting: Family Medicine

## 2018-07-25 ENCOUNTER — Other Ambulatory Visit: Payer: Self-pay | Admitting: Internal Medicine

## 2018-07-25 DIAGNOSIS — Z5111 Encounter for antineoplastic chemotherapy: Secondary | ICD-10-CM | POA: Diagnosis not present

## 2018-07-25 DIAGNOSIS — Z923 Personal history of irradiation: Secondary | ICD-10-CM | POA: Diagnosis not present

## 2018-07-25 DIAGNOSIS — C8400 Mycosis fungoides, unspecified site: Secondary | ICD-10-CM | POA: Diagnosis not present

## 2018-07-26 DIAGNOSIS — C8400 Mycosis fungoides, unspecified site: Secondary | ICD-10-CM | POA: Diagnosis not present

## 2018-07-26 DIAGNOSIS — E119 Type 2 diabetes mellitus without complications: Secondary | ICD-10-CM | POA: Diagnosis not present

## 2018-07-26 DIAGNOSIS — R438 Other disturbances of smell and taste: Secondary | ICD-10-CM | POA: Diagnosis not present

## 2018-07-26 DIAGNOSIS — Z6834 Body mass index (BMI) 34.0-34.9, adult: Secondary | ICD-10-CM | POA: Diagnosis not present

## 2018-07-26 DIAGNOSIS — R112 Nausea with vomiting, unspecified: Secondary | ICD-10-CM | POA: Diagnosis not present

## 2018-07-26 DIAGNOSIS — Z713 Dietary counseling and surveillance: Secondary | ICD-10-CM | POA: Diagnosis not present

## 2018-07-26 DIAGNOSIS — Z794 Long term (current) use of insulin: Secondary | ICD-10-CM | POA: Diagnosis not present

## 2018-07-26 DIAGNOSIS — Z5111 Encounter for antineoplastic chemotherapy: Secondary | ICD-10-CM | POA: Diagnosis not present

## 2018-07-26 DIAGNOSIS — R634 Abnormal weight loss: Secondary | ICD-10-CM | POA: Diagnosis not present

## 2018-07-26 DIAGNOSIS — R682 Dry mouth, unspecified: Secondary | ICD-10-CM | POA: Diagnosis not present

## 2018-07-26 NOTE — Telephone Encounter (Signed)
Last OV 07/06/18, Next OV Tomorrow  Last filled 06/27/18 # 90 with 0 refills, this was the one time referral per note from 06/25/18.  Please advise if okay to fill or refuse.

## 2018-07-26 NOTE — Telephone Encounter (Signed)
Decline.  Would not advise regular use.

## 2018-07-27 ENCOUNTER — Ambulatory Visit (INDEPENDENT_AMBULATORY_CARE_PROVIDER_SITE_OTHER): Payer: Medicare HMO | Admitting: Family Medicine

## 2018-07-27 ENCOUNTER — Encounter: Payer: Self-pay | Admitting: Family Medicine

## 2018-07-27 ENCOUNTER — Other Ambulatory Visit: Payer: Self-pay

## 2018-07-27 VITALS — BP 142/78 | HR 101 | Temp 98.3°F | Ht 69.0 in | Wt 216.0 lb

## 2018-07-27 DIAGNOSIS — I428 Other cardiomyopathies: Secondary | ICD-10-CM | POA: Diagnosis not present

## 2018-07-27 DIAGNOSIS — N183 Chronic kidney disease, stage 3 unspecified: Secondary | ICD-10-CM

## 2018-07-27 DIAGNOSIS — N179 Acute kidney failure, unspecified: Secondary | ICD-10-CM | POA: Diagnosis not present

## 2018-07-27 DIAGNOSIS — C8400 Mycosis fungoides, unspecified site: Secondary | ICD-10-CM | POA: Diagnosis not present

## 2018-07-27 DIAGNOSIS — R6520 Severe sepsis without septic shock: Secondary | ICD-10-CM | POA: Diagnosis not present

## 2018-07-27 DIAGNOSIS — T827XXA Infection and inflammatory reaction due to other cardiac and vascular devices, implants and grafts, initial encounter: Secondary | ICD-10-CM | POA: Diagnosis not present

## 2018-07-27 DIAGNOSIS — I2510 Atherosclerotic heart disease of native coronary artery without angina pectoris: Secondary | ICD-10-CM | POA: Diagnosis not present

## 2018-07-27 DIAGNOSIS — K5900 Constipation, unspecified: Secondary | ICD-10-CM | POA: Diagnosis not present

## 2018-07-27 DIAGNOSIS — Z452 Encounter for adjustment and management of vascular access device: Secondary | ICD-10-CM | POA: Diagnosis not present

## 2018-07-27 DIAGNOSIS — I5042 Chronic combined systolic (congestive) and diastolic (congestive) heart failure: Secondary | ICD-10-CM | POA: Diagnosis not present

## 2018-07-27 DIAGNOSIS — A4181 Sepsis due to Enterococcus: Secondary | ICD-10-CM | POA: Diagnosis not present

## 2018-07-27 DIAGNOSIS — I110 Hypertensive heart disease with heart failure: Secondary | ICD-10-CM | POA: Diagnosis not present
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
## 2018-07-27 NOTE — Patient Instructions (Signed)
Go ahead and resume the Lisinopril 2.5 mg one daily  May use OTC Miralax once daily as needed for constipation.   Hypomagnesemia Hypomagnesemia is a condition in which the level of magnesium in the blood is low. Magnesium is a mineral that is found in many foods. It is used in many different processes in the body. Hypomagnesemia can affect every organ in the body. It can cause life-threatening problems. What are the causes? Causes of hypomagnesemia include:  Not getting enough magnesium in your diet.  Malnutrition.  Problems with absorbing magnesium from the intestines.  Dehydration.  Alcohol abuse.  Vomiting.  Severe diarrhea.  Some medicines, including medicines that make you urinate more.  Certain diseases, such as kidney disease, diabetes, and overactive thyroid.  What are the signs or symptoms?  Involuntary shaking or trembling of a body part (tremor).  Confusion.  Muscle weakness.  Sensitivity to light, sound, and touch.  Psychiatric issues, such as depression, irritability, or psychosis.  Sudden tightening of muscles (muscle spasms).  Tingling in the arms and legs.  A feeling of fluttering of the heart. These symptoms are more severe if magnesium levels drop suddenly. How is this diagnosed? To make a diagnosis, your health care provider will do a physical exam and order blood and urine tests. How is this treated? Treatment will depend on the cause and the severity of your condition. It may involve:  A magnesium supplement. This can be taken in pill form. It can also be given through an IV tube. This is usually done if the condition is severe.  Changes to your diet. You may be directed to eat foods that have a lot of magnesium, such as green leafy vegetables, peas, beans, and nuts.  Eliminating alcohol from your diet.  Follow these instructions at home:  Include foods with magnesium in your diet. Foods that are rich in magnesium include green  vegetables, beans, nuts and seeds, and whole grains.  Take medicines only as directed by your health care provider.  Take magnesium supplements if your health care provider instructs you to do that. Take them as directed.  Have your magnesium levels monitored as directed by your health care provider.  When you are active, drink fluids that contain electrolytes.  Keep all follow-up visits as directed by your health care provider. This is important. Contact a health care provider if:  You get worse instead of better.  Your symptoms return. Get help right away if:  Your symptoms are severe. This information is not intended to replace advice given to you by your health care provider. Make sure you discuss any questions you have with your health care provider. Document Released: 06/17/2005 Document Revised: 02/27/2016 Document Reviewed: 05/07/2014 Elsevier Interactive Patient Education  2018 [employer].

## 2018-07-29 DIAGNOSIS — I5042 Chronic combined systolic (congestive) and diastolic (congestive) heart failure: Secondary | ICD-10-CM | POA: Diagnosis not present

## 2018-07-29 DIAGNOSIS — E785 Hyperlipidemia, unspecified: Secondary | ICD-10-CM | POA: Diagnosis not present

## 2018-07-29 DIAGNOSIS — E1142 Type 2 diabetes mellitus with diabetic polyneuropathy: Secondary | ICD-10-CM | POA: Diagnosis not present

## 2018-07-29 DIAGNOSIS — T827XXA Infection and inflammatory reaction due to other cardiac and vascular devices, implants and grafts, initial encounter: Secondary | ICD-10-CM | POA: Diagnosis not present

## 2018-07-29 DIAGNOSIS — R6520 Severe sepsis without septic shock: Secondary | ICD-10-CM | POA: Diagnosis not present

## 2018-07-29 DIAGNOSIS — A4181 Sepsis due to Enterococcus: Secondary | ICD-10-CM | POA: Diagnosis not present

## 2018-07-29 DIAGNOSIS — E1151 Type 2 diabetes mellitus with diabetic peripheral angiopathy without gangrene: Secondary | ICD-10-CM | POA: Diagnosis not present

## 2018-08-01 ENCOUNTER — Ambulatory Visit (INDEPENDENT_AMBULATORY_CARE_PROVIDER_SITE_OTHER): Payer: Medicare HMO

## 2018-08-01 DIAGNOSIS — E1142 Type 2 diabetes mellitus with diabetic polyneuropathy: Secondary | ICD-10-CM | POA: Diagnosis not present

## 2018-08-01 DIAGNOSIS — T827XXA Infection and inflammatory reaction due to other cardiac and vascular devices, implants and grafts, initial encounter: Secondary | ICD-10-CM | POA: Diagnosis not present

## 2018-08-01 DIAGNOSIS — I5042 Chronic combined systolic (congestive) and diastolic (congestive) heart failure: Secondary | ICD-10-CM | POA: Diagnosis not present

## 2018-08-01 DIAGNOSIS — Z95810 Presence of automatic (implantable) cardiac defibrillator: Secondary | ICD-10-CM | POA: Diagnosis not present

## 2018-08-01 DIAGNOSIS — A4181 Sepsis due to Enterococcus: Secondary | ICD-10-CM | POA: Diagnosis not present

## 2018-08-01 DIAGNOSIS — E1151 Type 2 diabetes mellitus with diabetic peripheral angiopathy without gangrene: Secondary | ICD-10-CM | POA: Diagnosis not present

## 2018-08-01 DIAGNOSIS — E785 Hyperlipidemia, unspecified: Secondary | ICD-10-CM | POA: Diagnosis not present

## 2018-08-01 DIAGNOSIS — R6520 Severe sepsis without septic shock: Secondary | ICD-10-CM | POA: Diagnosis not present

## 2018-08-01 NOTE — Progress Notes (Signed)
EPIC Encounter for ICM Monitoring  Patient Name: Eric Price is a 67 y.o. male Date: 08/01/2018 Primary Care Physican: [name], MD Primary Cardiologist:Berry Electrophysiologist:Croitoru Dry Weight:218 lbs      Heart Failure questions reviewed, pt weight gain of 4 lbs in last 2 weeks.  He reported Dr Elease Hashimoto has increased the Furosemide to 80 mg bid and that is his current dosage.   Thoracic impedance abnormal suggesting fluid accumulation starting 07/02/2018.   Prescribed: Furosemide80 mg take 1 tablet twice a day.  Taking differently: Current dosage is Furosemide 80 mg bid  Labs: 07/25/2018 Creatinine 1.6,   BUN 25. Potassium 3.6, Sodium 140, Care Everywhere 07/08/2018 Creatinine 1.40, BUN 23, Potassium 3.4, Sodium 136, eGFR 50-58 06/15/2018 Creatinine 1.77, BUN 30, Potassium 4.3, Sodium 135, EGFR 49.45 04/18/2018 Creatinine 1.22, BUN 15, Potassium 4.4, Sodium 139, EGFR 76.01 04/01/2018 Creatinine 1.49, BUN 25, Potassium 4.3, Sodium 140, EGFR 6036 03/13/2018 Creatinine1.03, BUN9, Potassium3.9, NLWHKN183, EGFR>60 03/11/2018 Creatinine1.00, BUN11, Potassium3.9, Sodium140, EGFR>60  03/10/2018 Creatinine1.16, BUN14, Potassium3.8, Sodium141, EGFR>60  03/09/2018 Creatinine1.33, BUN19, Potassium3.9, Sodium138, EGFR54->60  03/08/2018 Creatinine1.96, BUN27, Potassium4.0, Sodium134, ODQV50-01 @ 3:49 AM  03/08/2018 Creatinine1.96, BUN27, Potassium4.0, Sodium134, UYWX03-79 @ 1:15 AM 11/25/2017 Creatinine 1.10, BUN 23, Potassium 4.8, Sodium 138, EGFR 69-80 11/23/2017 Creatinine 1.19, BUN 29, Potassium 4.3, Sodium 140, EGFR 63-73  Recommendations:  Advised will send copy to Dr Harl Bowie and Dr Sallyanne Kuster for review and recommendations if needed.   Follow-up plan: ICM clinic phone appointment on 08/08/2018 (manual send) to recheck fluid levels.   Recall for 08/31/2018 with Kerin Ransom, PA.   3 month ICM trend: 08/01/2018    1 Year ICM trend:        Rosalene Billings, RN 08/01/2018 4:57 PM

## 2018-08-02 NOTE — Progress Notes (Signed)
I think he needs an office appointment please MCr

## 2018-08-03 NOTE — Progress Notes (Signed)
Dr Croitoru's nurse is scheduling patient an appointment.

## 2018-08-05 ENCOUNTER — Encounter: Payer: Self-pay | Admitting: Internal Medicine

## 2018-08-05 ENCOUNTER — Ambulatory Visit (INDEPENDENT_AMBULATORY_CARE_PROVIDER_SITE_OTHER): Payer: Medicare HMO | Admitting: Internal Medicine

## 2018-08-05 VITALS — BP 128/70 | HR 112 | Ht 69.0 in | Wt 214.0 lb

## 2018-08-05 DIAGNOSIS — R6510 Systemic inflammatory response syndrome (SIRS) of non-infectious origin without acute organ dysfunction: Secondary | ICD-10-CM | POA: Diagnosis not present

## 2018-08-05 DIAGNOSIS — E782 Mixed hyperlipidemia: Secondary | ICD-10-CM

## 2018-08-05 DIAGNOSIS — R634 Abnormal weight loss: Secondary | ICD-10-CM

## 2018-08-05 DIAGNOSIS — E1165 Type 2 diabetes mellitus with hyperglycemia: Secondary | ICD-10-CM | POA: Diagnosis not present

## 2018-08-05 DIAGNOSIS — E1159 Type 2 diabetes mellitus with other circulatory complications: Secondary | ICD-10-CM

## 2018-08-05 DIAGNOSIS — Z89512 Acquired absence of left leg below knee: Secondary | ICD-10-CM | POA: Diagnosis not present

## 2018-08-05 DIAGNOSIS — N289 Disorder of kidney and ureter, unspecified: Secondary | ICD-10-CM | POA: Diagnosis not present

## 2018-08-05 DIAGNOSIS — T827XXA Infection and inflammatory reaction due to other cardiac and vascular devices, implants and grafts, initial encounter: Secondary | ICD-10-CM | POA: Diagnosis not present

## 2018-08-05 MED ORDER — INSULIN DETEMIR 100 UNIT/ML FLEXPEN: PEN_INJECTOR | SUBCUTANEOUS | 3 refills | Status: DC

## 2018-08-05 MED ORDER — GLUCOSE BLOOD VI STRP: ORAL_STRIP | 3 refills | Status: DC

## 2018-08-05 MED ORDER — INSULIN ASPART 100 UNIT/ML FLEXPEN: PEN_INJECTOR | SUBCUTANEOUS | 0 refills | Status: DC

## 2018-08-05 NOTE — Patient Instructions (Addendum)
Please continue: - Metformin 1000 mg with dinner - Trulicity 1.5 mg weekly - Novolog 34-40 units before meals - Levemir 50 units 2x a day  Please return in 3 months with your sugar log.
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
## 2018-08-08 ENCOUNTER — Telehealth: Payer: Self-pay

## 2018-08-08 ENCOUNTER — Other Ambulatory Visit: Payer: Self-pay

## 2018-08-08 MED ORDER — GLUCOSE BLOOD VI STRP: ORAL_STRIP | 4 refills | Status: DC

## 2018-08-08 MED ORDER — ACCU-CHEK AVIVA DEVI: 0 refills | Status: DC

## 2018-08-08 NOTE — Telephone Encounter (Signed)
Spoke with pt and reminded pt of remote transmission that is due today. Pt verbalized understanding.   

## 2018-08-09 DIAGNOSIS — N179 Acute kidney failure, unspecified: Secondary | ICD-10-CM | POA: Diagnosis not present

## 2018-08-09 DIAGNOSIS — I5042 Chronic combined systolic (congestive) and diastolic (congestive) heart failure: Secondary | ICD-10-CM | POA: Diagnosis not present

## 2018-08-09 DIAGNOSIS — C8400 Mycosis fungoides, unspecified site: Secondary | ICD-10-CM | POA: Diagnosis not present

## 2018-08-09 DIAGNOSIS — T827XXA Infection and inflammatory reaction due to other cardiac and vascular devices, implants and grafts, initial encounter: Secondary | ICD-10-CM | POA: Diagnosis not present

## 2018-08-09 DIAGNOSIS — I2510 Atherosclerotic heart disease of native coronary artery without angina pectoris: Secondary | ICD-10-CM | POA: Diagnosis not present

## 2018-08-09 DIAGNOSIS — I110 Hypertensive heart disease with heart failure: Secondary | ICD-10-CM | POA: Diagnosis not present

## 2018-08-09 DIAGNOSIS — I428 Other cardiomyopathies: Secondary | ICD-10-CM | POA: Diagnosis not present

## 2018-08-09 DIAGNOSIS — A4181 Sepsis due to Enterococcus: Secondary | ICD-10-CM | POA: Diagnosis not present

## 2018-08-09 DIAGNOSIS — Z452 Encounter for adjustment and management of vascular access device: Secondary | ICD-10-CM | POA: Diagnosis not present

## 2018-08-09 DIAGNOSIS — H524 Presbyopia: Secondary | ICD-10-CM | POA: Diagnosis not present

## 2018-08-09 DIAGNOSIS — H52223 Regular astigmatism, bilateral: Secondary | ICD-10-CM | POA: Diagnosis not present

## 2018-08-09 DIAGNOSIS — R6520 Severe sepsis without septic shock: Secondary | ICD-10-CM | POA: Diagnosis not present

## 2018-08-10 ENCOUNTER — Telehealth: Payer: Self-pay | Admitting: Family Medicine

## 2018-08-10 NOTE — Telephone Encounter (Signed)
Copied from [place] [id number]. Topic: General - Other >> Aug 10, 2018  9:34 AM Oneta Rack wrote: Relation to pt: self Call back number: [phone] Pharmacy: [facility] [place], [place] [phone] (Phone) [phone] (Fax)  Reason for call:  Patient requesting tiZANidine (ZANAFLEX) 2 MG tablet refill request, pharmacy contacted and advised to call PCP. Informed patient please allow 48 to 72 hour turn around time, please advise

## 2018-08-10 NOTE — Telephone Encounter (Signed)
I think the first thing to do is to try to determine what is causing the jerking.  Set up follow up in next week so we can assess.

## 2018-08-10 NOTE — Telephone Encounter (Signed)
Called patient and he is scheduled to see [place] Friday, 08/12/18 at 10:40 am.

## 2018-08-10 NOTE — Telephone Encounter (Signed)
Called patient and he stated that he is jerking a lot in his arms and legs and he has uncontrollable shaking. Patient stated he has been taking 1 Tizanidine at bedtime. Not sure what he needs to take to help with this issue.  Please advise.

## 2018-08-12 ENCOUNTER — Ambulatory Visit: Payer: Medicare HMO | Admitting: Family Medicine

## 2018-08-14 NOTE — Telephone Encounter (Signed)
OK to send 45 ml with 3 refills

## 2018-08-15 DIAGNOSIS — Z5111 Encounter for antineoplastic chemotherapy: Secondary | ICD-10-CM | POA: Diagnosis not present

## 2018-08-15 DIAGNOSIS — Z923 Personal history of irradiation: Secondary | ICD-10-CM | POA: Diagnosis not present

## 2018-08-15 DIAGNOSIS — C8400 Mycosis fungoides, unspecified site: Secondary | ICD-10-CM | POA: Diagnosis not present

## 2018-08-15 DIAGNOSIS — G547 Phantom limb syndrome without pain: Secondary | ICD-10-CM | POA: Diagnosis not present

## 2018-08-16 DIAGNOSIS — C8400 Mycosis fungoides, unspecified site: Secondary | ICD-10-CM | POA: Diagnosis not present

## 2018-08-16 DIAGNOSIS — Z5111 Encounter for antineoplastic chemotherapy: Secondary | ICD-10-CM | POA: Diagnosis not present

## 2018-08-17 ENCOUNTER — Ambulatory Visit: Payer: Medicare HMO | Admitting: Family Medicine

## 2018-08-17 ENCOUNTER — Ambulatory Visit (INDEPENDENT_AMBULATORY_CARE_PROVIDER_SITE_OTHER): Payer: Medicare HMO | Admitting: Cardiovascular Disease

## 2018-08-17 ENCOUNTER — Encounter: Payer: Self-pay | Admitting: Cardiovascular Disease

## 2018-08-17 VITALS — BP 122/70 | HR 86 | Ht 69.0 in | Wt 224.4 lb

## 2018-08-17 DIAGNOSIS — C8400 Mycosis fungoides, unspecified site: Secondary | ICD-10-CM | POA: Diagnosis not present

## 2018-08-17 DIAGNOSIS — E782 Mixed hyperlipidemia: Secondary | ICD-10-CM

## 2018-08-17 DIAGNOSIS — I10 Essential (primary) hypertension: Secondary | ICD-10-CM

## 2018-08-17 DIAGNOSIS — I428 Other cardiomyopathies: Secondary | ICD-10-CM

## 2018-08-17 DIAGNOSIS — N183 Chronic kidney disease, stage 3 unspecified: Secondary | ICD-10-CM

## 2018-08-17 DIAGNOSIS — I5042 Chronic combined systolic (congestive) and diastolic (congestive) heart failure: Secondary | ICD-10-CM | POA: Diagnosis not present

## 2018-08-17 DIAGNOSIS — I472 Ventricular tachycardia: Secondary | ICD-10-CM | POA: Diagnosis not present

## 2018-08-17 DIAGNOSIS — I739 Peripheral vascular disease, unspecified: Secondary | ICD-10-CM | POA: Diagnosis not present

## 2018-08-17 DIAGNOSIS — I2510 Atherosclerotic heart disease of native coronary artery without angina pectoris: Secondary | ICD-10-CM | POA: Diagnosis not present

## 2018-08-17 DIAGNOSIS — E1151 Type 2 diabetes mellitus with diabetic peripheral angiopathy without gangrene: Secondary | ICD-10-CM | POA: Diagnosis not present

## 2018-08-17 DIAGNOSIS — R69 Illness, unspecified: Secondary | ICD-10-CM | POA: Diagnosis not present

## 2018-08-17 DIAGNOSIS — Z95810 Presence of automatic (implantable) cardiac defibrillator: Secondary | ICD-10-CM | POA: Diagnosis not present

## 2018-08-17 DIAGNOSIS — I4729 Other ventricular tachycardia: Secondary | ICD-10-CM

## 2018-08-17 MED ORDER — METOLAZONE 2.5 MG PO TABS: 2.5000 mg | ORAL_TABLET | ORAL | 3 refills | Status: DC
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
## 2018-08-17 NOTE — Patient Instructions (Addendum)
Medication Instructions:  Dr Sallyanne Kuster has recommended making the following medication changes: 1. START Metolazone - take 1 tablet tomorrow (08/18/18) 2. INCREASE Spironolactone to 50 mg until weigh less than 215 pounds THEN RESUME USUAL DOSE 3. START Magnesium 400 mg once daily  If you need a refill on your cardiac medications before your next appointment, please call your pharmacy.   Lab work: Your physician recommends that you return for lab work on Monday, November 18th, 2019.  If you have labs (blood work) drawn today and your tests are completely normal, you will receive your results only by: Marland Kitchen MyChart Message (if you have MyChart) OR . A paper copy in the mail If you have any lab test that is abnormal or we need to change your treatment, we will call you to review the results.  Testing/Procedures: Device transmission with Sharman Cheek, RN in 1 week.  Follow-Up: At [facility], you and your health needs are our priority.  As part of our continuing mission to provide you with exceptional heart care, we have created designated Provider Care Teams.  These Care Teams include your primary Cardiologist (physician) and Advanced Practice Providers (APPs -  Physician Assistants and Nurse Practitioners) who all work together to provide you with the care you need, when you need it. You will need a follow up appointment in 6 months.  Please call our office 2 months in advance to schedule this appointment.  You may see [name], MD or one of the following Advanced Practice Providers on your designated Care Team: [place], [place] . Fabian Sharp, PA-C  Any Other Special Instructions Will Be Listed Below (If Applicable).   Call on Friday with your weights on Friday.

## 2018-08-19 ENCOUNTER — Ambulatory Visit (INDEPENDENT_AMBULATORY_CARE_PROVIDER_SITE_OTHER): Payer: Medicare HMO | Admitting: Family Medicine

## 2018-08-19 ENCOUNTER — Encounter: Payer: Self-pay | Admitting: Family Medicine

## 2018-08-19 ENCOUNTER — Ambulatory Visit: Payer: Medicare HMO | Admitting: Cardiovascular Disease

## 2018-08-19 ENCOUNTER — Other Ambulatory Visit: Payer: Self-pay

## 2018-08-19 VITALS — BP 122/58 | HR 125 | Temp 98.6°F | Ht 69.0 in | Wt 218.7 lb

## 2018-08-19 DIAGNOSIS — G546 Phantom limb syndrome with pain: Secondary | ICD-10-CM | POA: Diagnosis not present

## 2018-08-19 DIAGNOSIS — R251 Tremor, unspecified: Secondary | ICD-10-CM

## 2018-08-19 LAB — TSH: TSH: 0.4 u[IU]/mL (ref 0.35–4.50)

## 2018-08-19 LAB — T4, FREE: FREE T4: 0.85 ng/dL (ref 0.60–1.60)

## 2018-08-19 MED ORDER — PREGABALIN 150 MG PO CAPS: 150.0000 mg | ORAL_CAPSULE | Freq: Two times a day (BID) | ORAL | 5 refills | Status: DC

## 2018-08-19 NOTE — Patient Instructions (Signed)
Give me some feedback in one week regarding phantom pain.
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
## 2018-08-21 ENCOUNTER — Encounter: Payer: Self-pay | Admitting: Family Medicine

## 2018-08-22 ENCOUNTER — Ambulatory Visit (INDEPENDENT_AMBULATORY_CARE_PROVIDER_SITE_OTHER): Payer: Medicare HMO

## 2018-08-22 DIAGNOSIS — I5042 Chronic combined systolic (congestive) and diastolic (congestive) heart failure: Secondary | ICD-10-CM

## 2018-08-22 DIAGNOSIS — I469 Cardiac arrest, cause unspecified: Secondary | ICD-10-CM

## 2018-08-22 DIAGNOSIS — Z95810 Presence of automatic (implantable) cardiac defibrillator: Secondary | ICD-10-CM

## 2018-08-22 NOTE — Progress Notes (Signed)
EPIC Encounter for ICM Monitoring  Patient Name: Eric Price is a 67 y.o. male Date: 08/22/2018 Primary Care Physican: Burchette, Bruce W, MD Primary Cardiologist:Berry Electrophysiologist:Croitoru Last Weight: 218 lbs Today's Weight:  unknown       Attempted call to patient and unable to reach.    Transmission reviewed.   Dr Croitoru ordered 1 dose of Metolazone 11/14.    Thoracic impedance returned to normal after taking 1 Metolazone.    Prescribed: Furosemide80 mg take 1 tablet daily.  (Patient taking differently: Take 80 mg by mouth 2 (two) times daily. Per patient dosage has increased to 80 mg bid.)   Labs: 07/25/2018 Creatinine 1.6,   BUN 25. Potassium 3.6, Sodium 140, Care Everywhere 07/08/2018 Creatinine 1.40, BUN 23, Potassium 3.4, Sodium 136, eGFR 50-58 06/15/2018 Creatinine 1.77, BUN 30, Potassium 4.3, Sodium 135, EGFR 49.45 04/18/2018 Creatinine 1.22, BUN 15, Potassium 4.4, Sodium 139, EGFR 76.01  Recommendations: Unable to reach.  Follow-up plan: ICM clinic phone appointment on 09/06/2018.      Copy of ICM check sent to Dr. Croitoru.   3 month ICM trend: 08/22/2018    1 Year ICM trend:       Laurie S Short, RN 08/22/2018 12:36 PM   

## 2018-08-22 NOTE — Progress Notes (Signed)
Remote ICD transmission.   

## 2018-08-23 ENCOUNTER — Other Ambulatory Visit: Payer: Self-pay

## 2018-08-23 ENCOUNTER — Encounter (HOSPITAL_COMMUNITY): Payer: Self-pay

## 2018-08-23 ENCOUNTER — Emergency Department (HOSPITAL_COMMUNITY): Payer: Medicare HMO

## 2018-08-23 ENCOUNTER — Observation Stay (HOSPITAL_COMMUNITY)
Admission: EM | Admit: 2018-08-23 | Discharge: 2018-08-26 | Disposition: A | Discharge status: Home / Self Care | Payer: Medicare HMO | Attending: Internal Medicine | Admitting: Internal Medicine

## 2018-08-23 DIAGNOSIS — I5042 Chronic combined systolic (congestive) and diastolic (congestive) heart failure: Secondary | ICD-10-CM | POA: Diagnosis present

## 2018-08-23 DIAGNOSIS — R000 Tachycardia, unspecified: Secondary | ICD-10-CM

## 2018-08-23 DIAGNOSIS — I130 Hypertensive heart and chronic kidney disease with heart failure and stage 1 through stage 4 chronic kidney disease, or unspecified chronic kidney disease: Secondary | ICD-10-CM | POA: Insufficient documentation

## 2018-08-23 DIAGNOSIS — E669 Obesity, unspecified: Secondary | ICD-10-CM | POA: Insufficient documentation

## 2018-08-23 DIAGNOSIS — D72829 Elevated white blood cell count, unspecified: Secondary | ICD-10-CM

## 2018-08-23 DIAGNOSIS — R509 Fever, unspecified: Secondary | ICD-10-CM | POA: Diagnosis not present

## 2018-08-23 DIAGNOSIS — D649 Anemia, unspecified: Secondary | ICD-10-CM | POA: Diagnosis not present

## 2018-08-23 DIAGNOSIS — N183 Chronic kidney disease, stage 3 unspecified: Secondary | ICD-10-CM

## 2018-08-23 DIAGNOSIS — C8400 Mycosis fungoides, unspecified site: Secondary | ICD-10-CM | POA: Diagnosis present

## 2018-08-23 DIAGNOSIS — Z89412 Acquired absence of left great toe: Secondary | ICD-10-CM | POA: Diagnosis not present

## 2018-08-23 DIAGNOSIS — E1140 Type 2 diabetes mellitus with diabetic neuropathy, unspecified: Secondary | ICD-10-CM | POA: Diagnosis not present

## 2018-08-23 DIAGNOSIS — Z95810 Presence of automatic (implantable) cardiac defibrillator: Secondary | ICD-10-CM | POA: Diagnosis not present

## 2018-08-23 DIAGNOSIS — I2510 Atherosclerotic heart disease of native coronary artery without angina pectoris: Secondary | ICD-10-CM

## 2018-08-23 DIAGNOSIS — I739 Peripheral vascular disease, unspecified: Secondary | ICD-10-CM | POA: Diagnosis present

## 2018-08-23 DIAGNOSIS — I82621 Acute embolism and thrombosis of deep veins of right upper extremity: Secondary | ICD-10-CM | POA: Insufficient documentation

## 2018-08-23 DIAGNOSIS — E860 Dehydration: Secondary | ICD-10-CM | POA: Diagnosis not present

## 2018-08-23 DIAGNOSIS — Z794 Long term (current) use of insulin: Secondary | ICD-10-CM

## 2018-08-23 DIAGNOSIS — R251 Tremor, unspecified: Secondary | ICD-10-CM

## 2018-08-23 DIAGNOSIS — E1122 Type 2 diabetes mellitus with diabetic chronic kidney disease: Secondary | ICD-10-CM | POA: Insufficient documentation

## 2018-08-23 DIAGNOSIS — Z89512 Acquired absence of left leg below knee: Secondary | ICD-10-CM | POA: Diagnosis not present

## 2018-08-23 DIAGNOSIS — N179 Acute kidney failure, unspecified: Secondary | ICD-10-CM | POA: Diagnosis not present

## 2018-08-23 DIAGNOSIS — R0600 Dyspnea, unspecified: Secondary | ICD-10-CM | POA: Diagnosis not present

## 2018-08-23 DIAGNOSIS — R197 Diarrhea, unspecified: Secondary | ICD-10-CM | POA: Diagnosis not present

## 2018-08-23 DIAGNOSIS — R0602 Shortness of breath: Secondary | ICD-10-CM | POA: Diagnosis not present

## 2018-08-23 DIAGNOSIS — C8409 Mycosis fungoides, extranodal and solid organ sites: Secondary | ICD-10-CM | POA: Insufficient documentation

## 2018-08-23 DIAGNOSIS — R5381 Other malaise: Secondary | ICD-10-CM

## 2018-08-23 DIAGNOSIS — E871 Hypo-osmolality and hyponatremia: Secondary | ICD-10-CM | POA: Diagnosis not present

## 2018-08-23 DIAGNOSIS — G4730 Sleep apnea, unspecified: Secondary | ICD-10-CM | POA: Insufficient documentation

## 2018-08-23 DIAGNOSIS — I10 Essential (primary) hypertension: Secondary | ICD-10-CM | POA: Diagnosis not present

## 2018-08-23 DIAGNOSIS — Z86718 Personal history of other venous thrombosis and embolism: Secondary | ICD-10-CM | POA: Insufficient documentation

## 2018-08-23 DIAGNOSIS — K219 Gastro-esophageal reflux disease without esophagitis: Secondary | ICD-10-CM

## 2018-08-23 DIAGNOSIS — I959 Hypotension, unspecified: Secondary | ICD-10-CM | POA: Diagnosis not present

## 2018-08-23 DIAGNOSIS — E1151 Type 2 diabetes mellitus with diabetic peripheral angiopathy without gangrene: Secondary | ICD-10-CM | POA: Diagnosis not present

## 2018-08-23 DIAGNOSIS — Z7901 Long term (current) use of anticoagulants: Secondary | ICD-10-CM | POA: Diagnosis not present

## 2018-08-23 DIAGNOSIS — E782 Mixed hyperlipidemia: Secondary | ICD-10-CM | POA: Insufficient documentation

## 2018-08-23 DIAGNOSIS — E785 Hyperlipidemia, unspecified: Secondary | ICD-10-CM

## 2018-08-23 DIAGNOSIS — E1159 Type 2 diabetes mellitus with other circulatory complications: Secondary | ICD-10-CM

## 2018-08-23 DIAGNOSIS — E1165 Type 2 diabetes mellitus with hyperglycemia: Secondary | ICD-10-CM | POA: Diagnosis not present

## 2018-08-23 DIAGNOSIS — Z79899 Other long term (current) drug therapy: Secondary | ICD-10-CM | POA: Insufficient documentation

## 2018-08-23 LAB — COMPREHENSIVE METABOLIC PANEL
ALT: 40 U/L (ref 0–44)
AST: 40 U/L (ref 15–41)
Albumin: 3.6 g/dL (ref 3.5–5.0)
Alkaline Phosphatase: 105 U/L (ref 38–126)
Anion gap: 13 (ref 5–15)
BUN: 56 mg/dL — ABNORMAL HIGH (ref 8–23)
CO2: 24 mmol/L (ref 22–32)
Calcium: 9.2 mg/dL (ref 8.9–10.3)
Chloride: 94 mmol/L — ABNORMAL LOW (ref 98–111)
Creatinine, Ser: 2.84 mg/dL — ABNORMAL HIGH (ref 0.61–1.24)
GFR calc Af Amer: 25 mL/min — ABNORMAL LOW (ref >60)
GFR calc non Af Amer: 21 mL/min — ABNORMAL LOW (ref >60)
Glucose, Bld: 277 mg/dL — ABNORMAL HIGH (ref 70–99)
Potassium: 3.8 mmol/L (ref 3.5–5.1)
Sodium: 131 mmol/L — ABNORMAL LOW (ref 135–145)
Total Bilirubin: 0.7 mg/dL (ref 0.3–1.2)
Total Protein: 6.5 g/dL (ref 6.5–8.1)

## 2018-08-23 LAB — CBC WITH DIFFERENTIAL/PLATELET
Band Neutrophils: 0 %
Basophils Absolute: 0 10*3/uL (ref 0.0–0.1)
Basophils Relative: 0 %
Blasts: 0 %
Eosinophils Absolute: 0.1 10*3/uL (ref 0.0–0.5)
Eosinophils Relative: 1 %
HCT: 29.8 % — ABNORMAL LOW (ref 39.0–52.0)
Hemoglobin: 9.6 g/dL — ABNORMAL LOW (ref 13.0–17.0)
Lymphocytes Relative: 4 %
Lymphs Abs: 0.5 10*3/uL — ABNORMAL LOW (ref 0.7–4.0)
MCH: 27.4 pg (ref 26.0–34.0)
MCHC: 32.2 g/dL (ref 30.0–36.0)
MCV: 84.9 fL (ref 80.0–100.0)
Metamyelocytes Relative: 0 %
Monocytes Absolute: 0.7 10*3/uL (ref 0.1–1.0)
Monocytes Relative: 6 %
Myelocytes: 0 %
Neutro Abs: 10.6 10*3/uL — ABNORMAL HIGH (ref 1.7–7.7)
Neutrophils Relative %: 89 %
Other: 0 %
Platelets: 227 10*3/uL (ref 150–400)
Promyelocytes Relative: 0 %
RBC: 3.51 MIL/uL — ABNORMAL LOW (ref 4.22–5.81)
RDW: 17.1 % — ABNORMAL HIGH (ref 11.5–15.5)
WBC Morphology: INCREASED
WBC: 11.9 10*3/uL — ABNORMAL HIGH (ref 4.0–10.5)
nRBC: 0 % (ref 0.0–0.2)
nRBC: 0 /100 WBC

## 2018-08-23 LAB — URINALYSIS, ROUTINE W REFLEX MICROSCOPIC
Bilirubin Urine: NEGATIVE
Glucose, UA: 50 mg/dL — AB
Hgb urine dipstick: NEGATIVE
Ketones, ur: NEGATIVE mg/dL
Leukocytes, UA: NEGATIVE
Nitrite: NEGATIVE
Protein, ur: NEGATIVE mg/dL
Specific Gravity, Urine: 1.01 (ref 1.005–1.030)
pH: 5 (ref 5.0–8.0)

## 2018-08-23 LAB — CUP PACEART INCLINIC DEVICE CHECK
Battery Remaining Longevity: 117 mo
HIGH POWER IMPEDANCE MEASURED VALUE: 56 Ohm
Implantable Lead Implant Date: 20161122
Implantable Lead Location: 753860
Implantable Pulse Generator Implant Date: 20161122
Lead Channel Pacing Threshold Amplitude: 0.625 V
Lead Channel Pacing Threshold Pulse Width: 0.4 ms
Lead Channel Sensing Intrinsic Amplitude: 6.125 mV
Lead Channel Setting Pacing Amplitude: 2.5 V
Lead Channel Setting Pacing Pulse Width: 0.4 ms
Lead Channel Setting Sensing Sensitivity: 0.3 mV
MDC IDC MSMT BATTERY VOLTAGE: 3.01 V
MDC IDC MSMT LEADCHNL RV IMPEDANCE VALUE: 285 Ohm
MDC IDC MSMT LEADCHNL RV IMPEDANCE VALUE: 323 Ohm
MDC IDC MSMT LEADCHNL RV SENSING INTR AMPL: 7.125 mV
MDC IDC SESS DTM: 20191113143422
MDC IDC STAT BRADY RV PERCENT PACED: 0 %

## 2018-08-23 LAB — CBG MONITORING, ED: Glucose-Capillary: 256 mg/dL — ABNORMAL HIGH (ref 70–99)

## 2018-08-23 LAB — GLUCOSE, CAPILLARY
GLUCOSE-CAPILLARY: 170 mg/dL — AB (ref 70–99)
Glucose-Capillary: 221 mg/dL — ABNORMAL HIGH (ref 70–99)

## 2018-08-23 LAB — BRAIN NATRIURETIC PEPTIDE: B Natriuretic Peptide: 110.3 pg/mL — ABNORMAL HIGH (ref 0.0–100.0)

## 2018-08-23 LAB — I-STAT TROPONIN, ED: Troponin i, poc: 0.01 ng/mL (ref 0.00–0.08)

## 2018-08-23 LAB — MAGNESIUM: Magnesium: 1.8 mg/dL (ref 1.7–2.4)

## 2018-08-23 MED ORDER — PANTOPRAZOLE SODIUM 40 MG PO TBEC
40.0000 mg | DELAYED_RELEASE_TABLET | Freq: Every day | ORAL | Status: DC
  Administered 2018-08-23 – 2018-08-26 (×4): 40 mg via ORAL
  Filled 2018-08-23 (×4): qty 1

## 2018-08-23 MED ORDER — APIXABAN 5 MG PO TABS
5.0000 mg | ORAL_TABLET | Freq: Two times a day (BID) | ORAL | Status: DC
  Administered 2018-08-23 – 2018-08-25 (×4): 5 mg via ORAL
  Filled 2018-08-23 (×3): qty 1

## 2018-08-23 MED ORDER — INSULIN ASPART 100 UNIT/ML ~~LOC~~ SOLN
0.0000 [IU] | Freq: Three times a day (TID) | SUBCUTANEOUS | Status: DC
  Administered 2018-08-24: 3 [IU] via SUBCUTANEOUS
  Administered 2018-08-24: 7 [IU] via SUBCUTANEOUS
  Administered 2018-08-24 – 2018-08-25 (×2): 3 [IU] via SUBCUTANEOUS
  Administered 2018-08-25 (×2): 7 [IU] via SUBCUTANEOUS
  Administered 2018-08-26: 2 [IU] via SUBCUTANEOUS
  Filled 2018-08-23 (×2): qty 1

## 2018-08-23 MED ORDER — ATORVASTATIN CALCIUM 20 MG PO TABS
20.0000 mg | ORAL_TABLET | Freq: Every day | ORAL | Status: DC
  Administered 2018-08-23 – 2018-08-26 (×4): 20 mg via ORAL
  Filled 2018-08-23 (×3): qty 1
  Filled 2018-08-23: qty 2
  Filled 2018-08-23: qty 1

## 2018-08-23 MED ORDER — NYSTATIN 100000 UNIT/ML MT SUSP
5.0000 mL | Freq: Four times a day (QID) | OROMUCOSAL | Status: DC
  Administered 2018-08-23 – 2018-08-26 (×9): 500000 [IU] via ORAL
  Filled 2018-08-23 (×10): qty 5

## 2018-08-23 MED ORDER — ENOXAPARIN SODIUM 30 MG/0.3ML ~~LOC~~ SOLN: 30.0000 mg | SUBCUTANEOUS | Status: DC

## 2018-08-23 MED ORDER — INSULIN DETEMIR 100 UNIT/ML ~~LOC~~ SOLN
50.0000 [IU] | Freq: Two times a day (BID) | SUBCUTANEOUS | Status: DC
  Administered 2018-08-23 – 2018-08-25 (×4): 50 [IU] via SUBCUTANEOUS
  Filled 2018-08-23 (×5): qty 0.5

## 2018-08-23 MED ORDER — CARVEDILOL 25 MG PO TABS
25.0000 mg | ORAL_TABLET | Freq: Two times a day (BID) | ORAL | Status: DC
  Administered 2018-08-24 – 2018-08-26 (×5): 25 mg via ORAL
  Filled 2018-08-23 (×5): qty 1

## 2018-08-23 MED ORDER — ACETAMINOPHEN 500 MG PO TABS
1000.0000 mg | ORAL_TABLET | Freq: Three times a day (TID) | ORAL | Status: DC | PRN
  Filled 2018-08-23: qty 2

## 2018-08-23 MED ORDER — HYDROXYZINE HCL 25 MG PO TABS: 25.0000 mg | ORAL_TABLET | Freq: Two times a day (BID) | ORAL | Status: DC | PRN

## 2018-08-23 MED ORDER — ENSURE ENLIVE PO LIQD
237.0000 mL | Freq: Two times a day (BID) | ORAL | Status: DC
  Administered 2018-08-24 – 2018-08-26 (×5): 237 mL via ORAL
  Filled 2018-08-23 (×6): qty 237

## 2018-08-23 MED ORDER — SODIUM CHLORIDE 0.9 % IV BOLUS
250.0000 mL | Freq: Once | INTRAVENOUS | Status: AC
  Administered 2018-08-23: 250 mL via INTRAVENOUS

## 2018-08-23 MED ORDER — ALBUTEROL SULFATE (2.5 MG/3ML) 0.083% IN NEBU: 2.5000 mg | INHALATION_SOLUTION | RESPIRATORY_TRACT | Status: DC | PRN

## 2018-08-23 MED ORDER — SODIUM CHLORIDE 0.9 % IV SOLN
INTRAVENOUS | Status: AC
  Administered 2018-08-23: via INTRAVENOUS

## 2018-08-23 MED ORDER — PREGABALIN 75 MG PO CAPS
150.0000 mg | ORAL_CAPSULE | Freq: Two times a day (BID) | ORAL | Status: DC
  Administered 2018-08-23 – 2018-08-24 (×2): 150 mg via ORAL
  Filled 2018-08-23 (×2): qty 2

## 2018-08-23 MED ORDER — TIZANIDINE HCL 2 MG PO TABS
2.0000 mg | ORAL_TABLET | Freq: Every evening | ORAL | Status: DC | PRN
  Administered 2018-08-23: 2 mg via ORAL
  Filled 2018-08-23 (×2): qty 1
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
## 2018-08-23 NOTE — ED Notes (Signed)
CBG 256  °

## 2018-08-23 NOTE — Progress Notes (Signed)
There was a quick improvement.  I do not think he needs to take it again, but keep that prescription in the back of his medicine cabinet to use as needed. MCr

## 2018-08-23 NOTE — Progress Notes (Signed)
ANTICOAGULATION CONSULT NOTE - Initial Consult  Pharmacy Consult for apixaban Indication: Hx of DVT  Allergies  Allergen Reactions  . Morphine Shortness Of Breath and Anaphylaxis  . Morphine And Related     "took my breath away"  . Oxycodone     Pt stated, "It makes me climbs walls; fight wars"    Patient Measurements: Height: 5' 9"  (175.3 cm) Weight: 218 lb (98.9 kg) IBW/kg (Calculated) : 70.7  Vital Signs: Temp: 98.3 F (36.8 C) (11/19 2039) Temp Source: Oral (11/19 2039) BP: 123/66 (11/19 2039) Pulse Rate: 103 (11/19 2039)  Labs: Recent Labs    08/23/18 1647  HGB 9.6*  HCT 29.8*  PLT 227  CREATININE 2.84*    Estimated Creatinine Clearance: 28.9 mL/min (A) (by C-G formula based on SCr of 2.84 mg/dL (H)).   Medical History: Past Medical History:  Diagnosis Date  . Arthritis    knees, shoulder, hands   . Chronic combined systolic and diastolic CHF (congestive heart failure) ([place]) 07/17/2016  . Coronary artery disease   . Critical lower limb ischemia   . Depression   . Hyperlipidemia   . Hypertension   . 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   . Nonischemic cardiomyopathy ([place])   . Peripheral vascular disease ([place])   . Pneumonia 2013   hosp. - [facility] x1 week   . Renal insufficiency 03/08/2018  . Shortness of breath dyspnea    related to pain currently  . SIRS (systemic inflammatory response syndrome) ([place]) 03/2018  . Sleep apnea    10-20 yrs. ago, states he used CPAP, not needed anymore.   . Type II diabetes mellitus ([place])     Medications:  Medications Prior to Admission  Medication Sig Dispense Refill Last Dose  . acetaminophen (TYLENOL) 500 MG tablet Take 1,000 mg by mouth every 8 (eight) hours as needed for mild pain.    08/23/2018 at Unknown time  . albuterol (PROVENTIL HFA;VENTOLIN HFA) 108 (90 Base) MCG/ACT inhaler Inhale 2 puffs into the lungs every 4 (four) hours as needed for wheezing or shortness of  breath. 1 Inhaler 2 prn  . atorvastatin (LIPITOR) 20 MG tablet TAKE 1 TABLET BY MOUTH EVERY DAY AT 6 (Patient taking differently: Take 20 mg by mouth daily. ) 90 tablet 0 08/22/2018 at Unknown time  . carvedilol (COREG) 25 MG tablet Take 1.5 tablets (37.5 mg total) by mouth 2 (two) times daily with a meal. (Patient taking differently: Take 25 mg by mouth 2 (two) times daily with a meal. ) 270 tablet 3 08/23/2018 at 1030  . DOCQLACE 100 MG capsule TAKE 1 CAPSULE BY MOUTH DAILY (Patient taking differently: TAKE 1 CAPSULE BY MOUTH DAILY AS NEEDED) 30 capsule 0 08/23/2018 at Unknown time  . Dulaglutide (TRULICITY) 1.5 HA/5.7XU SOPN INJECT CONTENTS OF ONE PEN UNDER THE SKIN ONCE A WEEK (Patient taking differently: Inject 1.5 mg into the skin every Tuesday. ) 12 pen 3 08/16/2018  . ELIQUIS 5 MG TABS tablet TAKE 1 TABLET(5 MG) BY MOUTH TWICE DAILY (Patient taking differently: Take 5 mg by mouth 2 (two) times daily. ) 60 tablet 0 08/23/2018 at 1030  . furosemide (LASIX) 80 MG tablet Take 1 tablet (80 mg total) by mouth daily. (Patient taking differently: Take 80 mg by mouth 2 (two) times daily. Per patient dosage has increased to 80 mg bid.) 90 tablet 3 08/23/2018 at Unknown time  . glucagon 1 MG injection Inject 1 mg into  the vein once as needed (for high blood sugar). 1 each 3 prn  . hydrOXYzine (ATARAX/VISTARIL) 25 MG tablet TAKE 1- 3 TABLETS BY MOUTH TWICE DAILY AS NEEDED FOR ITCHING (Patient taking differently: Take 25-75 mg by mouth 2 (two) times daily as needed for itching. ) 180 tablet 0 08/23/2018 at Unknown time  . insulin aspart (NOVOLOG FLEXPEN) 100 UNIT/ML FlexPen INJECT 34-40 UNITS BEFORE the 3 meals of the day (Patient taking differently: Inject 34-40 Units into the skin 3 (three) times daily with meals. ) 30 mL 0 08/23/2018 at Unknown time  . Insulin Detemir (LEVEMIR FLEXTOUCH) 100 UNIT/ML Pen ADMINISTER 50 UNITS UNDER THE SKIN TWICE DAILY (Patient taking differently: Inject 50 Units into the skin  2 (two) times daily. ) 30 mL 3 08/23/2018 at Unknown time  . metFORMIN (GLUCOPHAGE) 1000 MG tablet Take 1 tablet (1,000 mg total) by mouth daily with supper. 180 tablet 3 08/22/2018 at Unknown time  . metolazone (ZAROXOLYN) 2.5 MG tablet Take 1 tablet (2.5 mg total) by mouth as directed. 30 tablet 3 unk  . nitroGLYCERIN (NITROSTAT) 0.4 MG SL tablet Place 1 tablet (0.4 mg total) under the tongue every 5 (five) minutes as needed for chest pain. 60 tablet 12 prn  . nystatin (MYCOSTATIN) 100000 UNIT/ML suspension Take 5 mLs by mouth 4 (four) times daily.    08/23/2018 at Unknown time  . pantoprazole (PROTONIX) 40 MG tablet Take 1 tablet (40 mg total) by mouth daily. 90 tablet 3 08/23/2018 at Unknown time  . pregabalin (LYRICA) 150 MG capsule Take 1 capsule (150 mg total) by mouth 2 (two) times daily. 60 capsule 5 08/23/2018 at Unknown time  . promethazine (PHENERGAN) 12.5 MG tablet Take 12.5 mg by mouth every 6 (six) hours as needed for nausea or vomiting.   08/23/2018 at Unknown time  . spironolactone (ALDACTONE) 25 MG tablet Take 1 tablet (25 mg total) by mouth daily. 90 tablet 3 08/23/2018 at Unknown time  . tiZANidine (ZANAFLEX) 2 MG tablet TAKE 1 TABLET(2 MG) BY MOUTH AT BEDTIME AS NEEDED FOR MUSCLE SPASMS (Patient taking differently: Take 2 mg by mouth at bedtime. ) 90 tablet 0 08/22/2018 at Unknown time  . traMADol (ULTRAM) 50 MG tablet Take 50 mg by mouth every 6 (six) hours as needed for moderate pain.    08/22/2018 at Unknown time  . Blood Glucose Monitoring Suppl (ACCU-CHEK AVIVA) device Use to check blood sugar 6 times daily 1 each 0 Taking  . clobetasol cream (TEMOVATE) 7.93 % APPLY 1 APPLICATION TOPICALLY TWICE DAILY (Patient not taking: Reported on 08/23/2018) 30 g 2 Not Taking at Unknown time  . feeding supplement, ENSURE ENLIVE, (ENSURE ENLIVE) LIQD Take 237 mLs by mouth 2 (two) times daily between meals. (Patient not taking: Reported on 08/23/2018) 237 mL 12 Not Taking at Unknown time  .  fenofibrate (TRICOR) 145 MG tablet Take 1 tablet (145 mg total) by mouth daily. (Patient not taking: Reported on 08/19/2018) 90 tablet 3 Not Taking at Unknown time  . folic acid (FOLVITE) 1 MG tablet Take 1 tablet (1 mg total) by mouth daily. (Patient not taking: Reported on 08/23/2018)   Not Taking at Unknown time  . glucose blood (ACCU-CHEK ACTIVE STRIPS) test strip Use to check blood sugar 6 times a day 300 each 4 Taking  . lisinopril (PRINIVIL,ZESTRIL) 2.5 MG tablet Take 2 tablets (5 mg total) by mouth daily. (Patient not taking: Reported on 08/23/2018) 180 tablet 3 Not Taking at Unknown time  . Nutritional  Supplements (ENSURE COMPLETE PO) Take by mouth.   Not Taking at Unknown time  . ONE TOUCH LANCETS MISC Check 2 times daily. 100 each 3 Taking    Assessment: 52 yoM with extensive cardiac history. Includes history of DVT on chronic apixaban. No bleeding noted on admission. Pharmacy consulted to restart home apixaban. Age 48, SCr 2.84, weight 98.9 kg.  Goal of Therapy:  Anticoagulation Monitor platelets by anticoagulation protocol: Yes   Plan:  Continue apixaban 5 mg PO BID Monitor renal function and for signs and symptoms of bleeding   Thank you for allowing [place] to participate in this patients care.   Jens Som, PharmD Please utilize [facility] (under [place]) for appropriate number for your unit pharmacist. 08/23/2018 10:13 PM

## 2018-08-23 NOTE — ED Triage Notes (Signed)
Pt from home via ems; c/o generalized weakness, intermittent sob and tremors x 1 week; cancer pt, currently receiving chemo treatments at [facility]; CBG 379; pt given 300 cc NS PTA; no fever, no cough; intermittent diarrhea x 2-3 weeks, no episodes of diarrhea today  104/60 HR 100 RR 18 98% RA CBG 379

## 2018-08-23 NOTE — ED Notes (Signed)
Patient transported to X-ray 

## 2018-08-24 ENCOUNTER — Observation Stay (HOSPITAL_COMMUNITY): Payer: Medicare HMO

## 2018-08-24 ENCOUNTER — Other Ambulatory Visit: Payer: Self-pay | Admitting: Family Medicine

## 2018-08-24 ENCOUNTER — Other Ambulatory Visit: Payer: Self-pay | Admitting: Internal Medicine

## 2018-08-24 DIAGNOSIS — E1140 Type 2 diabetes mellitus with diabetic neuropathy, unspecified: Secondary | ICD-10-CM

## 2018-08-24 DIAGNOSIS — K219 Gastro-esophageal reflux disease without esophagitis: Secondary | ICD-10-CM

## 2018-08-24 DIAGNOSIS — N179 Acute kidney failure, unspecified: Secondary | ICD-10-CM

## 2018-08-24 DIAGNOSIS — I5042 Chronic combined systolic (congestive) and diastolic (congestive) heart failure: Secondary | ICD-10-CM

## 2018-08-24 DIAGNOSIS — C8400 Mycosis fungoides, unspecified site: Secondary | ICD-10-CM | POA: Diagnosis not present

## 2018-08-24 DIAGNOSIS — I10 Essential (primary) hypertension: Secondary | ICD-10-CM | POA: Diagnosis not present

## 2018-08-24 DIAGNOSIS — R000 Tachycardia, unspecified: Secondary | ICD-10-CM

## 2018-08-24 DIAGNOSIS — R5381 Other malaise: Secondary | ICD-10-CM | POA: Diagnosis not present

## 2018-08-24 DIAGNOSIS — D649 Anemia, unspecified: Secondary | ICD-10-CM

## 2018-08-24 DIAGNOSIS — D72829 Elevated white blood cell count, unspecified: Secondary | ICD-10-CM

## 2018-08-24 DIAGNOSIS — R251 Tremor, unspecified: Secondary | ICD-10-CM

## 2018-08-24 LAB — BASIC METABOLIC PANEL
Anion gap: 12 (ref 5–15)
BUN: 51 mg/dL — AB (ref 8–23)
CALCIUM: 9.2 mg/dL (ref 8.9–10.3)
CHLORIDE: 96 mmol/L — AB (ref 98–111)
CO2: 24 mmol/L (ref 22–32)
CREATININE: 2.33 mg/dL — AB (ref 0.61–1.24)
GFR calc non Af Amer: 27 mL/min — ABNORMAL LOW (ref >60)
GFR, EST AFRICAN AMERICAN: 31 mL/min — AB (ref >60)
Glucose, Bld: 224 mg/dL — ABNORMAL HIGH (ref 70–99)
Potassium: 3.6 mmol/L (ref 3.5–5.1)
Sodium: 132 mmol/L — ABNORMAL LOW (ref 135–145)

## 2018-08-24 LAB — IRON AND TIBC
Iron: 28 ug/dL — ABNORMAL LOW (ref 45–182)
Saturation Ratios: 9 % — ABNORMAL LOW (ref 17.9–39.5)
TIBC: 305 ug/dL (ref 250–450)
UIBC: 277 ug/dL

## 2018-08-24 LAB — CBC
HEMATOCRIT: 28.3 % — AB (ref 39.0–52.0)
Hemoglobin: 9.2 g/dL — ABNORMAL LOW (ref 13.0–17.0)
MCH: 27.4 pg (ref 26.0–34.0)
MCHC: 32.5 g/dL (ref 30.0–36.0)
MCV: 84.2 fL (ref 80.0–100.0)
PLATELETS: 221 10*3/uL (ref 150–400)
RBC: 3.36 MIL/uL — ABNORMAL LOW (ref 4.22–5.81)
RDW: 17.1 % — ABNORMAL HIGH (ref 11.5–15.5)
WBC: 9 10*3/uL (ref 4.0–10.5)
nRBC: 0 % (ref 0.0–0.2)

## 2018-08-24 LAB — GLUCOSE, CAPILLARY
GLUCOSE-CAPILLARY: 335 mg/dL — AB (ref 70–99)
Glucose-Capillary: 227 mg/dL — ABNORMAL HIGH (ref 70–99)
Glucose-Capillary: 280 mg/dL — ABNORMAL HIGH (ref 70–99)

## 2018-08-24 LAB — FERRITIN: Ferritin: 874 ng/mL — ABNORMAL HIGH (ref 24–336)

## 2018-08-24 LAB — OSMOLALITY, URINE: OSMOLALITY UR: 413 mosm/kg (ref 300–900)

## 2018-08-24 LAB — SODIUM, URINE, RANDOM: Sodium, Ur: 90 mmol/L

## 2018-08-24 LAB — OCCULT BLOOD X 1 CARD TO LAB, STOOL: FECAL OCCULT BLD: NEGATIVE

## 2018-08-24 MED ORDER — SODIUM CHLORIDE 0.9% FLUSH: 10.0000 mL | Freq: Two times a day (BID) | INTRAVENOUS | Status: DC

## 2018-08-24 MED ORDER — SODIUM CHLORIDE 0.9 % IV SOLN
INTRAVENOUS | Status: DC
  Administered 2018-08-24: 19:00:00 via INTRAVENOUS

## 2018-08-24 MED ORDER — SODIUM CHLORIDE 0.9% FLUSH: 10.0000 mL | INTRAVENOUS | Status: DC | PRN

## 2018-08-24 MED ORDER — ACETAMINOPHEN 500 MG PO TABS
1000.0000 mg | ORAL_TABLET | Freq: Three times a day (TID) | ORAL | Status: DC | PRN
  Administered 2018-08-24 – 2018-08-26 (×2): 1000 mg via ORAL
  Filled 2018-08-24: qty 2

## 2018-08-24 MED ORDER — METHOCARBAMOL 500 MG PO TABS
500.0000 mg | ORAL_TABLET | Freq: Every evening | ORAL | Status: DC | PRN
  Administered 2018-08-24: 500 mg via ORAL
  Filled 2018-08-24: qty 1

## 2018-08-24 NOTE — Care Management Obs Status (Signed)
[place] NOTIFICATION   Patient Details  Name: Eric Price MRN: [id number] Date of Birth: 10/26/1950   Medicare Observation Status Notification Given:  Yes    Midge Minium RN, BSN, NCM-BC, ACM-RN [phone] 08/24/2018, 1:47 PM

## 2018-08-24 NOTE — Care Management Note (Addendum)
Case Management Note  Patient Details  Name: Eric Price MRN: [id number] Date of Birth: 09/09/1951  Subjective/Objective:  67 yo male presented with dyspnea and generalized weakness.               Action/Plan: CM met with patient to discuss dispositional needs. Patient lives at home with his spouse, ambulates with a SPC and his Left prosthetic, independent with ADLs; spouse available to assist as needed. PCP verified as: Bruce Burchette; pharmacy of choice: [facility]. Patient is active with [facility] for HHRN/PT services(no resumption orders needed if patient remains in OBS status). Patient's spouse will provide transportation home. CM team will continue to follow.   Expected Discharge Date:                  Expected Discharge Plan:  Home w Home Health Services  In-House Referral:  NA  Discharge planning Services  CM Consult  Post Acute Care Choice:  Resumption of Svcs/PTA Provider Choice offered to:  NA  DME Arranged:  N/A DME Agency:  NA  HH Arranged:  NA HH Agency:  NA  Status of Service:  Completed, signed off  If discussed at Long Length of Stay Meetings, dates discussed:    Additional Comments: 08/25/18 @ 1555-  RNCM-Xarelto/rivaroxaban benefits check complete with est monthly copay $0. CM met with patient/spouse with Xarelto 30-day free card provided; patient informed of est monthly cost and verbalized understanding. CM updated Donna, [facility] liaison of plans for patient to transition home later today. No further needs from CM.     RN, BSN, NCM-BC, ACM-RN [phone] 08/24/2018, 1:08 PM  

## 2018-08-24 NOTE — Discharge Instructions (Addendum)
Information on my medicine - XARELTO (rivaroxaban)  This medication education was reviewed with me or my healthcare representative as part of my discharge preparation.    WHY WAS XARELTO PRESCRIBED FOR YOU? Xarelto was prescribed to treat blood clots that may have been found in the veins of your legs (deep vein thrombosis) or in your lungs (pulmonary embolism) and to reduce the risk of them occurring again.  What do you need to know about Xarelto?  The dose is one 20 mg tablet taken ONCE A DAY with your evening meal.  DO NOT stop taking Xarelto without talking to the health care provider who prescribed the medication.  Refill your prescription for 20 mg tablets before you run out.  After discharge, you should have regular check-up appointments with your healthcare provider that is prescribing your Xarelto.  In the future your dose may need to be changed if your kidney function changes by a significant amount.  What do you do if you miss a dose?  If you are taking Xarelto ONCE DAILY and you miss a dose, take it as soon as you remember on the same day then continue your regularly scheduled once daily regimen the next day. Do not take two doses of Xarelto at the same time.   Important Safety Information Xarelto is a blood thinner medicine that can cause bleeding. You should call your healthcare provider right away if you experience any of the following: ? Bleeding from an injury or your nose that does not stop. ? Unusual colored urine (red or dark brown) or unusual colored stools (red or black). ? Unusual bruising for unknown reasons. ? A serious fall or if you hit your head (even if there is no bleeding).  Some medicines may interact with Xarelto and might increase your risk of bleeding while on Xarelto. To help avoid this, consult your healthcare provider or pharmacist prior to using any new prescription or non-prescription medications, including herbals, vitamins, non-steroidal  anti-inflammatory drugs (NSAIDs) and supplements.  This website has more information on Xarelto: [email or web address]

## 2018-08-24 NOTE — Evaluation (Signed)
Physical Therapy Evaluation Patient Details Name: Eric Price MRN: [id number] DOB: 1951-03-31 Today's Date: 08/24/2018   History of Present Illness  Pt is a 67 y/o male who presents with progressive weakness and SOB for the past 2 weeks. 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  Clinical Impression  Pt admitted with above diagnosis. Pt currently with functional limitations due to the deficits listed below (see PT Problem List). At the time of PT eval pt was able to perform transfers and ambulation with gross min guard assist to supervision for safety. 2 instances of LOB noted during ambulation in which min assist was provided to recover.   Pt reports feeling deconditioned compared to his baseline of function. During gait training, HR 124 bpm and O2 96-98% on RA. Pt reports feeling dyspneic however did not appear to have an increased work of breathing throughout any aspect of PT evaluation.   Pt reports that he had great success with HHPT the last time they were ordered, and is interested in resuming HHPT services at d/c. Feel this would be beneficial in maximizing functional independence and return to PLOF. Acutely, pt will benefit from skilled PT to increase their independence and safety with mobility to allow discharge to the venue listed below.       Follow Up Recommendations Home health PT;Supervision for mobility/OOB    Equipment Recommendations  None recommended by PT    Recommendations for Other Services       Precautions / Restrictions Precautions Precautions: Fall Precaution Comments: L BKA - has prosthetic and personal cane in room Restrictions Weight Bearing Restrictions: No      Mobility  Bed Mobility Overal bed mobility: Needs Assistance Bed Mobility: Supine to Sit     Supine to sit: Supervision      General bed mobility comments: HOB flat and rails lowered to simulate home environment. Pt required increased time and cues to complete without assist.   Transfers Overall transfer level: Needs assistance Equipment used: None Transfers: Sit to/from Stand Sit to Stand: Min guard;Supervision         General transfer comment: Pt initially stood without SPC and then reached down to pick it up from edge of bed. Min guard assist to supervision for safety provided as this was the first time pt was observed transferring by PT.   Ambulation/Gait Ambulation/Gait assistance: Min guard;Min assist Gait Distance (Feet): 200 Feet Assistive device: Straight cane Gait Pattern/deviations: Step-through pattern;Decreased stride length;Decreased weight shift to left Gait velocity: Decreased Gait velocity interpretation: 1.31 - 2.62 ft/sec, indicative of limited community ambulator General Gait Details: Overall ambulating well with SPC for support and prosthetic donned. Pt states he is in the process of getting a new prosthetic made as the fit of the prosthetic he has on now has changed. Noted 1 small LOB and 1 larger LOB during ambulation in which min assist was provided to recover.   Stairs            Wheelchair Mobility    Modified Rankin (Stroke Patients Only)       Balance Overall balance assessment: Needs assistance Sitting-balance support: Feet supported;No upper extremity supported Sitting balance-Leahy Scale: Good     Standing balance support: No upper extremity supported Standing balance-Leahy Scale: Fair Standing balance comment: statically EOB. requires UE support for dynamic standing balance.  Pertinent Vitals/Pain Pain Assessment: No/denies pain    Home Living Family/patient expects to be discharged to:: Private residence Living Arrangements: Spouse/significant other Available Help at Discharge: Family;Available 24 hours/day Type  of Home: House Home Access: Stairs to enter Entrance Stairs-Rails: Can reach both Entrance Stairs-Number of Steps: 3 Home Layout: Two level;Able to live on main level with bedroom/bathroom Home Equipment: Gilford Rile - 2 wheels;Cane - single point;Bedside commode;Tub bench      Prior Function Level of Independence: Independent with assistive device(s)               Hand Dominance   Dominant Hand: Right    Extremity/Trunk Assessment   Upper Extremity Assessment Upper Extremity Assessment: Overall WFL for tasks assessed    Lower Extremity Assessment Lower Extremity Assessment: RLE deficits/detail;LLE deficits/detail RLE Deficits / Details: 5/5 strength in hamstrings and quads, 4/5 strength in hip flexor. Pt reports he has peripheral neuropathy "real bad" in his R foot and has limited feeling.  RLE Sensation: history of peripheral neuropathy LLE Deficits / Details: 4/5 strength in hip flexors    Cervical / Trunk Assessment Cervical / Trunk Assessment: Normal  Communication   Communication: No difficulties  Cognition Arousal/Alertness: Awake/alert Behavior During Therapy: WFL for tasks assessed/performed;Flat affect(at times) Overall Cognitive Status: Within Functional Limits for tasks assessed                                        General Comments      Exercises     Assessment/Plan    PT Assessment Patient needs continued PT services  PT Problem List Decreased activity tolerance;Decreased balance;Decreased mobility;Decreased knowledge of use of DME;Decreased safety awareness;Cardiopulmonary status limiting activity       PT Treatment Interventions DME instruction;Gait training;Stair training;Functional mobility training;Therapeutic activities;Therapeutic exercise;Neuromuscular re-education;Patient/family education    PT Goals (Current goals can be found in the Care Plan section)  Acute Rehab PT Goals Patient Stated Goal: Get back to PLOF - asking  for HHPT to resume services PT Goal Formulation: With patient Time For Goal Achievement: 08/31/18 Potential to Achieve Goals: Good    Frequency Min 3X/week   Barriers to discharge        Co-evaluation               AM-PAC PT "6 Clicks" Daily Activity  Outcome Measure Difficulty turning over in bed (including adjusting bedclothes, sheets and blankets)?: None Difficulty moving from lying on back to sitting on the side of the bed? : A Little Difficulty sitting down on and standing up from a chair with arms (e.g., wheelchair, bedside commode, etc,.)?: A Little Help needed moving to and from a bed to chair (including a wheelchair)?: A Little Help needed walking in hospital room?: A Little Help needed climbing 3-5 steps with a railing? : A Little 6 Click Score: 19    End of Session Equipment Utilized During Treatment: Gait belt Activity Tolerance: Patient tolerated treatment well Patient left: in chair;with call bell/phone within reach Nurse Communication: Mobility status PT Visit Diagnosis: Unsteadiness on feet (R26.81);Difficulty in walking, not elsewhere classified (R26.2)    Time: 3016-0109 PT Time Calculation (min) (ACUTE ONLY): 28 min   Charges:   PT Evaluation $PT Eval Moderate Complexity: 1 Mod PT Treatments $Gait Training: 8-22 mins        Rolinda Roan, PT, DPT Acute Rehabilitation Services Pager: [phone] Office: [phone]   Clarene [facility]  Corben Auzenne 08/24/2018, 10:22 AM

## 2018-08-24 NOTE — Progress Notes (Signed)
[facility]  Patient Status: Active (receiving services up to time of hospitalization)  [facility] is providing the following services: RN and PT  If patient discharges after hours, please call [phone].   Janae Sauce 08/24/2018, 10:20 AM

## 2018-08-24 NOTE — Progress Notes (Signed)
Patient developed fever.  ? Source--- PICC line x 4 months for his treatment from [name].   blood cultures x 2.  Hold on abx for now.  Urine clean/x ray clear-- may need to recheck x ray after IV hydration if cough or other symptom develops.    Treat fever with tylenol.

## 2018-08-24 NOTE — Progress Notes (Signed)
Progress Note    Eric Price  XNT:[id number] DOB: [birth year]  DOA: 08/23/2018 PCP: [name], MD    Brief Narrative:     Medical records reviewed and are as summarized below:  Eric Price is an 67 y.o. 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  Patient is presenting with a 2-week history of dyspnea and generalized weakness.  Also reports having hand tremors.   Assessment/Plan:   Principal Problem:   AKI (acute kidney injury) ([place]) Active Problems:   HTN (hypertension)   HLD (hyperlipidemia)   Mycosis fungoides (HCC)   Type 2 diabetes mellitus (HCC)   CAD (coronary artery disease)   Chronic combined systolic and diastolic CHF (congestive heart failure) (HCC)   PAD (peripheral artery disease) (HCC)   Leukocytosis   Tachycardia   Tremor of both hands   Chronic anemia   Diabetic neuropathy (HCC)   GERD (gastroesophageal reflux disease)   Physical deconditioning  AKI on CKD stage III -Gentle IV fluid hydration (history of CHF) -trend Cr -Avoid nephrotoxic agents/contrast  Mild leukocytosis -resolved  Tachycardia -unclear -on BB -doubt PE as is on eliquis -continue to monitor on tele  DM -levemir -SSI Carb mod diet  Bilateral hand tremors -Recently check TSH and free T4 were normal -recent increased in lyrica with worsening kidney function??-- hold lyrica for now and monitor  Anemia- unknown etiology -? Etiology -check Fe panel -baseline around 11  Type 2 diabetes -Blood glucose 277 on admission.  A1c 7.3 on July 12, 2018. -Sliding scale insulin sensitive -CBG checks -Continue home Levemir 50 units twice daily  Diabetic  neuropathy -hold lyrica -?resume at lower dose  GERD -Continue PPI  Chronic combined systolic and diastolic CHF -Hold home Lasix, metolazone, lisinopril in the setting of AKI -gentle IVF as to not cause an acute flare of CHF  CAD -Stable.  Continue home beta-blocker, statin.   Hypertension -Currently normotensive. -Hold home Lasix, metolazone, lisinopril in the setting of AKI -Continue home beta-blocker  Hyponatremia -due to dehydration -improving with IVF  Hyperlipidemia -Continue home Lipitor  Peripheral arterial disease -Angiogram done in May 2019 revealed his profunda femoris to have thrombotic occlusion as well as his tibial arteries.  He was placed on Eliquis at that time -Continue home Eliquis, Lipitor  Mycosis fungoides -Followed by oncology at [facility].  Currently on chemo.  Last office visit August 15, 2018.  Physical deconditioning -Continue home feeding supplement -PT evaluation- home health  obesity Estimated body mass index is 32.19 kg/m as calculated from the following:   Height as of this encounter: 5\' 9"  (1.753 m).   Weight as of this encounter: 98.9 kg.  Family Communication/Anticipated D/C date and plan/Code Status   DVT prophylaxis: eliquis Code Status: Full Code.  Family Communication: wife at bedside Disposition Plan: 24-48 hours depending on symptoms and labs   Medical Consultants:    None.     Subjective:   Wife reporting tremors at home  Objective:    Vitals:   08/23/18 2015 08/23/18 2039 08/24/18 0103 08/24/18 0639  BP: 106/73 123/66 115/78 114/68  Pulse: (!) 102 (!) 103 (!) 112 (!) 111  Resp:  18 16 16   Temp:  98.3 F (36.8 C) 99.3 F (37.4 C) 98.8 F (37.1  C)  TempSrc:  Oral Oral Oral  SpO2: 95% 97% 97% 96%  Weight:      Height:        Intake/Output Summary (Last 24 hours) at 08/24/2018 1322 Last data filed at 08/24/2018 0300 Gross per 24 hour  Intake 591.38 ml  Output -  Net 591.38 ml   Filed  Weights   08/23/18 1551  Weight: 98.9 kg    Exam: In bed, frail appearing Tachy but regular No increased work of breathing Left AKA -no LE edema -+BS, soft, NT  Data Reviewed:   I have personally reviewed following labs and imaging studies:  Labs: Labs show the following:   Basic Metabolic Panel: Recent Labs  Lab 08/23/18 1647 08/24/18 0524  NA 131* 132*  K 3.8 3.6  CL 94* 96*  CO2 24 24  GLUCOSE 277* 224*  BUN 56* 51*  CREATININE 2.84* 2.33*  CALCIUM 9.2 9.2  MG 1.8  --    GFR Estimated Creatinine Clearance: 35.2 mL/min (A) (by C-G formula based on SCr of 2.33 mg/dL (H)). Liver Function Tests: Recent Labs  Lab 08/23/18 1647  AST 40  ALT 40  ALKPHOS 105  BILITOT 0.7  PROT 6.5  ALBUMIN 3.6   No results for input(s): LIPASE, AMYLASE in the last 168 hours. No results for input(s): AMMONIA in the last 168 hours. Coagulation profile No results for input(s): INR, PROTIME in the last 168 hours.  CBC: Recent Labs  Lab 08/23/18 1647 08/24/18 0524  WBC 11.9* 9.0  NEUTROABS 10.6*  --   HGB 9.6* 9.2*  HCT 29.8* 28.3*  MCV 84.9 84.2  PLT 227 221   Cardiac Enzymes: No results for input(s): CKTOTAL, CKMB, CKMBINDEX, TROPONINI in the last 168 hours. BNP (last 3 results) Recent Labs    06/15/18 1644  PROBNP 17.0   CBG: Recent Labs  Lab 08/23/18 1656 08/23/18 2152 08/23/18 2352 08/24/18 0722  GLUCAP 256* 170* 221* 227*   D-Dimer: No results for input(s): DDIMER in the last 72 hours. Hgb A1c: No results for input(s): HGBA1C in the last 72 hours. Lipid Profile: No results for input(s): CHOL, HDL, LDLCALC, TRIG, CHOLHDL, LDLDIRECT in the last 72 hours. Thyroid function studies: No results for input(s): TSH, T4TOTAL, T3FREE, THYROIDAB in the last 72 hours.  Invalid input(s): FREET3 Anemia work up: No results for input(s): VITAMINB12, FOLATE, FERRITIN, TIBC, IRON, RETICCTPCT in the last 72 hours. Sepsis Labs: Recent Labs  Lab 08/23/18 1647  08/24/18 0524  WBC 11.9* 9.0    Microbiology No results found for this or any previous visit (from the past 240 hour(s)).  Procedures and diagnostic studies:  Dg Chest 2 View  Result Date: 08/23/2018 CLINICAL DATA:  Dyspnea EXAM: CHEST - 2 VIEW COMPARISON:  07/08/2018 chest radiograph. FINDINGS: Stable configuration of single lead left subclavian ICD. Right PICC terminates in the lower third of the SVC. Surgical clips overlie the right axilla. Stable cardiomediastinal silhouette with top-normal heart size. No pneumothorax. No pleural effusion. No overt pulmonary edema. No acute consolidative airspace disease. IMPRESSION: No active cardiopulmonary disease. Electronically Signed   By: Ilona Sorrel M.D.   On: 08/23/2018 17:33   US Renal  Result Date: 08/24/2018 CLINICAL DATA:  Acute kidney injury. History of diabetes and hypertension. EXAM: RENAL / URINARY TRACT ULTRASOUND COMPLETE COMPARISON:  None. FINDINGS: Right Kidney: Renal measurements: 11.8 x 5.4 x 4.7 cm = volume: 159 mL . Echogenicity within normal limits. No mass or hydronephrosis visualized. Left Kidney: Renal measurements: 11.4  x 4.4 x 4.1 cm = volume: 110 mL. Echogenicity within normal limits. No mass or hydronephrosis visualized. Bladder: Appears normal for degree of bladder distention. IMPRESSION: Normal ultrasound appearance of the kidneys and bladder. No hydronephrosis. Electronically Signed   By: Lucienne Capers M.D.   On: 08/24/2018 04:40    Medications:   . apixaban  5 mg Oral BID  . atorvastatin  20 mg Oral Daily  . carvedilol  25 mg Oral BID WC  . feeding supplement (ENSURE ENLIVE)  237 mL Oral BID BM  . insulin aspart  0-9 Units Subcutaneous TID WC  . insulin detemir  50 Units Subcutaneous BID  . nystatin  5 mL Oral QID  . pantoprazole  40 mg Oral Daily   Continuous Infusions:   LOS: 0 days   Geradine Girt  [facility]   *Please refer to [email or web address], password [email or web address] to get updated schedule on who will  round on this patient, as hospitalists switch teams weekly. If 7PM-7AM, please contact night-coverage at [email or web address], password [email or web address] for any overnight needs.  08/24/2018, 1:22 PM

## 2018-08-25 ENCOUNTER — Encounter: Payer: Self-pay | Admitting: Cardiology

## 2018-08-25 ENCOUNTER — Observation Stay (HOSPITAL_BASED_OUTPATIENT_CLINIC_OR_DEPARTMENT_OTHER): Payer: Medicare HMO

## 2018-08-25 DIAGNOSIS — I739 Peripheral vascular disease, unspecified: Secondary | ICD-10-CM | POA: Diagnosis not present

## 2018-08-25 DIAGNOSIS — M7989 Other specified soft tissue disorders: Secondary | ICD-10-CM

## 2018-08-25 DIAGNOSIS — R52 Pain, unspecified: Secondary | ICD-10-CM | POA: Diagnosis not present

## 2018-08-25 DIAGNOSIS — R5381 Other malaise: Secondary | ICD-10-CM | POA: Diagnosis not present

## 2018-08-25 DIAGNOSIS — R000 Tachycardia, unspecified: Secondary | ICD-10-CM

## 2018-08-25 DIAGNOSIS — N179 Acute kidney failure, unspecified: Secondary | ICD-10-CM | POA: Diagnosis not present

## 2018-08-25 DIAGNOSIS — I428 Other cardiomyopathies: Secondary | ICD-10-CM | POA: Diagnosis not present

## 2018-08-25 DIAGNOSIS — I10 Essential (primary) hypertension: Secondary | ICD-10-CM | POA: Diagnosis not present

## 2018-08-25 DIAGNOSIS — I5042 Chronic combined systolic (congestive) and diastolic (congestive) heart failure: Secondary | ICD-10-CM | POA: Diagnosis not present

## 2018-08-25 DIAGNOSIS — N183 Chronic kidney disease, stage 3 (moderate): Secondary | ICD-10-CM | POA: Diagnosis not present

## 2018-08-25 DIAGNOSIS — I82621 Acute embolism and thrombosis of deep veins of right upper extremity: Secondary | ICD-10-CM | POA: Diagnosis not present

## 2018-08-25 DIAGNOSIS — C8400 Mycosis fungoides, unspecified site: Secondary | ICD-10-CM | POA: Diagnosis not present

## 2018-08-25 LAB — GLUCOSE, CAPILLARY
GLUCOSE-CAPILLARY: 311 mg/dL — AB (ref 70–99)
GLUCOSE-CAPILLARY: 330 mg/dL — AB (ref 70–99)
Glucose-Capillary: 230 mg/dL — ABNORMAL HIGH (ref 70–99)
Glucose-Capillary: 271 mg/dL — ABNORMAL HIGH (ref 70–99)

## 2018-08-25 LAB — BASIC METABOLIC PANEL
ANION GAP: 9 (ref 5–15)
BUN: 36 mg/dL — AB (ref 8–23)
CALCIUM: 9.1 mg/dL (ref 8.9–10.3)
CO2: 27 mmol/L (ref 22–32)
Chloride: 98 mmol/L (ref 98–111)
Creatinine, Ser: 1.67 mg/dL — ABNORMAL HIGH (ref 0.61–1.24)
GFR calc Af Amer: 47 mL/min — ABNORMAL LOW (ref >60)
GFR, EST NON AFRICAN AMERICAN: 40 mL/min — AB (ref >60)
Glucose, Bld: 219 mg/dL — ABNORMAL HIGH (ref 70–99)
POTASSIUM: 3.5 mmol/L (ref 3.5–5.1)
Sodium: 134 mmol/L — ABNORMAL LOW (ref 135–145)

## 2018-08-25 LAB — CBC
HCT: 28.2 % — ABNORMAL LOW (ref 39.0–52.0)
Hemoglobin: 8.7 g/dL — ABNORMAL LOW (ref 13.0–17.0)
MCH: 26.6 pg (ref 26.0–34.0)
MCHC: 30.9 g/dL (ref 30.0–36.0)
MCV: 86.2 fL (ref 80.0–100.0)
NRBC: 0 % (ref 0.0–0.2)
PLATELETS: 218 10*3/uL (ref 150–400)
RBC: 3.27 MIL/uL — AB (ref 4.22–5.81)
RDW: 16.9 % — AB (ref 11.5–15.5)
WBC: 8.5 10*3/uL (ref 4.0–10.5)

## 2018-08-25 LAB — UREA NITROGEN, URINE: Urea Nitrogen, Ur: 501 mg/dL

## 2018-08-25 MED ORDER — TIZANIDINE HCL 2 MG PO TABS
2.0000 mg | ORAL_TABLET | Freq: Every evening | ORAL | Status: DC | PRN
  Administered 2018-08-25: 2 mg via ORAL
  Filled 2018-08-25 (×2): qty 1

## 2018-08-25 MED ORDER — SODIUM CHLORIDE 0.9 % IV SOLN
510.0000 mg | Freq: Once | INTRAVENOUS | Status: AC
  Administered 2018-08-25: 510 mg via INTRAVENOUS
  Filled 2018-08-25: qty 17

## 2018-08-25 MED ORDER — INSULIN DETEMIR 100 UNIT/ML ~~LOC~~ SOLN
55.0000 [IU] | Freq: Two times a day (BID) | SUBCUTANEOUS | Status: DC
  Administered 2018-08-25 – 2018-08-26 (×2): 55 [IU] via SUBCUTANEOUS
  Filled 2018-08-25 (×2): qty 0.55

## 2018-08-25 MED ORDER — RIVAROXABAN 20 MG PO TABS
20.0000 mg | ORAL_TABLET | Freq: Every day | ORAL | Status: DC
  Administered 2018-08-25: 20 mg via ORAL
  Filled 2018-08-25: qty 1

## 2018-08-25 NOTE — Care Management (Signed)
Benefits check sent and pending for Xarelto/rivaroxaban.   Midge Minium RN, BSN, NCM-BC, ACM-RN [phone]

## 2018-08-25 NOTE — Progress Notes (Signed)
ANTICOAGULATION CONSULT NOTE - Initial Consult  Pharmacy Consult for rivaroxaban Indication: h/o VTE  Allergies  Allergen Reactions  . Morphine Shortness Of Breath and Anaphylaxis  . Morphine And Related     "took my breath away"  . Oxycodone     Pt stated, "It makes me climbs walls; fight wars"    Patient Measurements: Height: 5' 9"  (175.3 cm) Weight: 218 lb (98.9 kg) IBW/kg (Calculated) : 70.7   Vital Signs: Temp: 99 F (37.2 C) (11/21 0759) Temp Source: Oral (11/21 1200) BP: 103/57 (11/21 1200) Pulse Rate: 107 (11/21 1200)  Labs: Recent Labs    08/23/18 1647 08/24/18 0524 08/25/18 0435  HGB 9.6* 9.2* 8.7*  HCT 29.8* 28.3* 28.2*  PLT 227 221 218  CREATININE 2.84* 2.33* 1.67*    Estimated Creatinine Clearance: 49.1 mL/min (A) (by C-G formula based on SCr of 1.67 mg/dL (H)).   Medical History: Past Medical History:  Diagnosis Date  . Arthritis    knees, shoulder, hands   . Chronic combined systolic and diastolic CHF (congestive heart failure) ([place]) 07/17/2016  . Coronary artery disease   . Critical lower limb ischemia   . Depression   . Hyperlipidemia   . Hypertension   . Mycosis fungoides ([place])    ALK negative; TCR positive; CD30 positive, CD3 positive.   . Nonischemic cardiomyopathy ([place])   . Peripheral vascular disease ([place])   . Pneumonia 2013   hosp. - [facility] x1 week   . Renal insufficiency 03/08/2018  . Shortness of breath dyspnea    related to pain currently  . SIRS (systemic inflammatory response syndrome) ([place]) 03/2018  . Sleep apnea    10-20 yrs. ago, states he used CPAP, not needed anymore.   . 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 VSS has recommended a change in anticoagulation. D/W Dr. Eliseo Squires. As this is likely a "chronic incidental finding" per VSS, will change  patient to rivaroxaban 67m daily for CrCl >50 (TBW). This will start ~12 hours from last dose of apixaban.    Goal of Therapy:    Monitor platelets by anticoagulation protocol: Yes  Monitor for signs/sx of bleeding   Plan:  Rivaroxaban 263mdaily to start at 2100 tonight.  Casmere Hollenbeck A. PiLevada DyPharmD, BCManorhavenager: [phone] utilize Amion for appropriate phone number to reach the unit pharmacist ([place]  08/25/2018,12:45 PM

## 2018-08-25 NOTE — Progress Notes (Signed)
*  Preliminary Results* Right upper extremity venous duplex completed. Right upper extremity is positive for age indeterminate deep vein thrombosis surrounding the PICC line in the right internal jugular and subclavian veins.  Preliminary results discussed with Dr. Eliseo Squires.  08/25/2018 8:54 AM Maudry Mayhew, MHA, RVT, RDCS, RDMS

## 2018-08-25 NOTE — Progress Notes (Signed)
Progress Note    Gottlieb Zuercher  BSJ:[id number] DOB: 21-Aug-1951  DOA: 08/23/2018 PCP: [name], MD    Brief Narrative:     Medical records reviewed and are as summarized below:  Uriyah Raska is an 67 y.o. 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  Patient is presenting with a 2-week history of dyspnea and generalized weakness.  Also reports having hand tremors.   Assessment/Plan:   Principal Problem:   AKI (acute kidney injury) ([place]) Active Problems:   HTN (hypertension)   HLD (hyperlipidemia)   Mycosis fungoides (HCC)   Type 2 diabetes mellitus (HCC)   CAD (coronary artery disease)   Chronic combined systolic and diastolic CHF (congestive heart failure) (HCC)   PAD (peripheral artery disease) (HCC)   Leukocytosis   Tachycardia   Tremor of both hands   Chronic anemia   Diabetic neuropathy (HCC)   GERD (gastroesophageal reflux disease)   Physical deconditioning  AKI on CKD stage III -s/p Gentle IV fluid hydration (history of CHF) -CR much improved -holding diuretic -Avoid nephrotoxic agents/contrast  Mild leukocytosis -resolved  Tachycardia -unclear -on BB -doubt PE as is on eliquis -continue to monitor on tele  Fever (11/20) -blood cultures NGTD -has PICC line on right (has been in for around 4 months) -duplex shows blood clot -vascular consult: as patient needs continued PICC Line, plan to change eliquis to xarelto and PICC Line removal as soon as possible -no recurrent fever  Bilateral hand tremors -Recently check TSH and free T4 were normal -recent increased in lyrica with worsening kidney function??-- hold lyrica for now and  monitor-- seems to be improving daily  Anemia- unknown etiology -Fe appears to be low-- plan to give dose of IV Fe and monitor outpatient -stool negative -baseline around 11  Type 2 diabetes -Blood glucose 277 on admission.  A1c 7.3 on July 12, 2018. -Sliding scale insulin sensitive -CBG checks -Continue home Levemir 50 units twice daily  Diabetic neuropathy -hold lyrica -?resume at lower dose  GERD -Continue PPI  Chronic combined systolic and diastolic CHF -Hold home Lasix, metolazone, lisinopril in the setting of AKI  CAD -Stable.  Continue home beta-blocker, statin.   Hypertension -Currently normotensive. -Hold home Lasix, metolazone, lisinopril in the setting of AKI -Continue home beta-blocker  Hyponatremia -due to dehydration -improved with IVF  Hyperlipidemia -Continue home Lipitor  Peripheral arterial disease -Angiogram done in May 2019 revealed his profunda femoris to have thrombotic occlusion as well as his tibial arteries.  He was placed on Eliquis at that time -Continue  Lipitor -eliquis changed to xarelto  Mycosis fungoides -Followed by oncology at [facility].  Currently on chemo.  Last office visit August 15, 2018.  Physical deconditioning -Continue home feeding supplement -PT evaluation- home health  obesity Estimated body mass index is 32.19 kg/m as calculated from the following:   Height as of this encounter: 5\' 9"  (1.753 m).   Weight as of this encounter: 98.9 kg.  Family Communication/Anticipated D/C date and plan/Code Status   DVT prophylaxis: xarelto Code Status: Full Code.  Family Communication: wife at bedside Disposition Plan: home in AM   Medical Consultants:    vascular     Subjective:   Tremors improved thirsty  Objective:  Vitals:   08/24/18 1833 08/24/18 2211 08/25/18 0759 08/25/18 1200  BP:  106/64 112/76 (!) 103/57  Pulse:  (!) 102 (!) 110 (!) 107  Resp:  18 20 20   Temp: (!) 101.7 F (38.7  C) 98.9 F (37.2 C) 99 F (37.2 C)   TempSrc: Rectal Oral Oral Oral  SpO2:  99% 97% 97%  Weight:      Height:        Intake/Output Summary (Last 24 hours) at 08/25/2018 1547 Last data filed at 08/25/2018 0834 Gross per 24 hour  Intake 631.9 ml  Output 400 ml  Net 231.9 ml   Filed Weights   08/23/18 1551  Weight: 98.9 kg    Exam: pleasanat and cooperative Pulses intact Left BKA Slightly tachycardic No LE edema  Data Reviewed:   I have personally reviewed following labs and imaging studies:  Labs: Labs show the following:   Basic Metabolic Panel: Recent Labs  Lab 08/23/18 1647 08/24/18 0524 08/25/18 0435  NA 131* 132* 134*  K 3.8 3.6 3.5  CL 94* 96* 98  CO2 24 24 27   GLUCOSE 277* 224* 219*  BUN 56* 51* 36*  CREATININE 2.84* 2.33* 1.67*  CALCIUM 9.2 9.2 9.1  MG 1.8  --   --    GFR Estimated Creatinine Clearance: 49.1 mL/min (A) (by C-G formula based on SCr of 1.67 mg/dL (H)). Liver Function Tests: Recent Labs  Lab 08/23/18 1647  AST 40  ALT 40  ALKPHOS 105  BILITOT 0.7  PROT 6.5  ALBUMIN 3.6   No results for input(s): LIPASE, AMYLASE in the last 168 hours. No results for input(s): AMMONIA in the last 168 hours. Coagulation profile No results for input(s): INR, PROTIME in the last 168 hours.  CBC: Recent Labs  Lab 08/23/18 1647 08/24/18 0524 08/25/18 0435  WBC 11.9* 9.0 8.5  NEUTROABS 10.6*  --   --   HGB 9.6* 9.2* 8.7*  HCT 29.8* 28.3* 28.2*  MCV 84.9 84.2 86.2  PLT 227 221 218   Cardiac Enzymes: No results for input(s): CKTOTAL, CKMB, CKMBINDEX, TROPONINI in the last 168 hours. BNP (last 3 results) Recent Labs    06/15/18 1644  PROBNP 17.0   CBG: Recent Labs  Lab 08/24/18 0722 08/24/18 1630 08/24/18 2204 08/25/18 0752 08/25/18 1152  GLUCAP 227* 335* 280* 230* 311*   D-Dimer: No results for input(s): DDIMER in the last 72 hours. Hgb A1c: No results for input(s): HGBA1C in the last 72 hours. Lipid Profile: No  results for input(s): CHOL, HDL, LDLCALC, TRIG, CHOLHDL, LDLDIRECT in the last 72 hours. Thyroid function studies: No results for input(s): TSH, T4TOTAL, T3FREE, THYROIDAB in the last 72 hours.  Invalid input(s): FREET3 Anemia work up: Recent Labs    08/24/18 1402  FERRITIN 874*  TIBC 305  IRON 28*   Sepsis Labs: Recent Labs  Lab 08/23/18 1647 08/24/18 0524 08/25/18 0435  WBC 11.9* 9.0 8.5    Microbiology Recent Results (from the past 240 hour(s))  Culture, blood (Routine X 2) w Reflex to ID Panel     Status: None (Preliminary result)   Collection Time: 08/24/18  8:50 PM  Result Value Ref Range Status   Specimen Description BLOOD LEFT HAND  Final   Special Requests   Final    BOTTLES DRAWN AEROBIC ONLY Blood Culture adequate volume   Culture   Final    NO GROWTH < 24 HOURS Performed at [facility], [address], [place], [place]  [address]    Report Status PENDING  Incomplete    Procedures and diagnostic studies:  Dg Chest 2 View  Result Date: 08/23/2018 CLINICAL DATA:  Dyspnea EXAM: CHEST - 2 VIEW COMPARISON:  07/08/2018 chest radiograph. FINDINGS: Stable configuration of single lead left subclavian ICD. Right PICC terminates in the lower third of the SVC. Surgical clips overlie the right axilla. Stable cardiomediastinal silhouette with top-normal heart size. No pneumothorax. No pleural effusion. No overt pulmonary edema. No acute consolidative airspace disease. IMPRESSION: No active cardiopulmonary disease. Electronically Signed   By: Ilona Sorrel M.D.   On: 08/23/2018 17:33   US Renal  Result Date: 08/24/2018 CLINICAL DATA:  Acute kidney injury. History of diabetes and hypertension. EXAM: RENAL / URINARY TRACT ULTRASOUND COMPLETE COMPARISON:  None. FINDINGS: Right Kidney: Renal measurements: 11.8 x 5.4 x 4.7 cm = volume: 159 mL . Echogenicity within normal limits. No mass or hydronephrosis visualized. Left Kidney: Renal measurements: 11.4 x 4.4 x 4.1 cm =  volume: 110 mL. Echogenicity within normal limits. No mass or hydronephrosis visualized. Bladder: Appears normal for degree of bladder distention. IMPRESSION: Normal ultrasound appearance of the kidneys and bladder. No hydronephrosis. Electronically Signed   By: Lucienne Capers M.D.   On: 08/24/2018 04:40   Vas [place] Upper Extremity Venous Duplex  Result Date: 08/25/2018 UPPER VENOUS STUDY  Indications: Pain, Swelling, and PICC line Performing Technologist: Maudry Mayhew MHA, RDMS, RVT, RDCS  Examination Guidelines: A complete evaluation includes B-mode imaging, spectral Doppler, color Doppler, and power Doppler as needed of all accessible portions of each vessel. Bilateral testing is considered an integral part of a complete examination. Limited examinations for reoccurring indications may be performed as noted.  Right Findings: +----------+------------+----------+---------+-----------+-------------------+ RIGHT     CompressiblePropertiesPhasicitySpontaneous      Summary       +----------+------------+----------+---------+-----------+-------------------+ IJV           None                 Yes       Yes     Age Indeterminate  +----------+------------+----------+---------+-----------+-------------------+ Subclavian  Partial                Yes       Yes     Age Indeterminate  +----------+------------+----------+---------+-----------+-------------------+ Axillary      Full                 Yes       Yes                        +----------+------------+----------+---------+-----------+-------------------+ Brachial      Full                 Yes       Yes                        +----------+------------+----------+---------+-----------+-------------------+ Radial        Full                                                      +----------+------------+----------+---------+-----------+-------------------+ Ulnar         Full                                                       +----------+------------+----------+---------+-----------+-------------------+  Cephalic      Full                                                      +----------+------------+----------+---------+-----------+-------------------+ Basilic                                             Unable to visualize +----------+------------+----------+---------+-----------+-------------------+  Left Findings: +----------+------------+----------+---------+-----------+-------+ LEFT      CompressiblePropertiesPhasicitySpontaneousSummary +----------+------------+----------+---------+-----------+-------+ Subclavian                         Yes       Yes            +----------+------------+----------+---------+-----------+-------+  Summary:  Right: Right upper extremity is positive for age indeterminate deep vein thrombosis surrounding the PICC line in the right internal jugular and subclavian veins.  Left: No evidence of thrombosis in the subclavian.  *See table(s) above for measurements and observations.  Diagnosing physician: Monica Martinez MD Electronically signed by Monica Martinez MD on 08/25/2018 at 3:37:28 PM.    Final     Medications:   . atorvastatin  20 mg Oral Daily  . carvedilol  25 mg Oral BID WC  . feeding supplement (ENSURE ENLIVE)  237 mL Oral BID BM  . insulin aspart  0-9 Units Subcutaneous TID WC  . insulin detemir  50 Units Subcutaneous BID  . nystatin  5 mL Oral QID  . pantoprazole  40 mg Oral Daily  . rivaroxaban  20 mg Oral Q supper  . sodium chloride flush  10-40 mL Intracatheter Q12H   Continuous Infusions: . ferumoxytol       LOS: 0 days   Geradine Girt  [facility]   *Please refer to [email or web address], password [email or web address] to get updated schedule on who will round on this patient, as hospitalists switch teams weekly. If 7PM-7AM, please contact night-coverage at [email or web address], password [email or web address] for any overnight needs.  08/25/2018, 3:47 PM

## 2018-08-25 NOTE — Progress Notes (Signed)
Inpatient Diabetes Program Recommendations  AACE/ADA: New Consensus Statement on Inpatient Glycemic Control (2015)  Target Ranges:  Prepandial:   less than 140 mg/dL      Peak postprandial:   less than 180 mg/dL (1-2 hours)      Critically ill patients:  140 - 180 mg/dL   Lab Results  Component Value Date   GLUCAP 230 (H) 08/25/2018   HGBA1C 7.3 (A) 07/12/2018    Review of Glycemic Control Results for Eric Price, Eric Price (MRN [id number]) as of 08/25/2018 10:36  Ref. Range 08/24/2018 16:30 08/24/2018 22:04 08/25/2018 07:52  Glucose-Capillary Latest Ref Range: 70 - 99 mg/dL 335 (H) 280 (H) 230 (H)   Diabetes history: Type 2 DM Outpatient Diabetes medications: Levemir 50 units BID, Trulicity 1.5 mg Q Tuesday, Metformin 1000 mg QPM, Novolog 34-40 units TID Current orders for Inpatient glycemic control: Levemir 50 units BID, Novolog 0-9 units TID  Inpatient Diabetes Program Recommendations:    Consider adding meal coverage: Novolog 7 units TID (assuming patient is consuming >50% of meal) for increased post pranidals.  Thanks, Bronson Curb, MSN, RNC-OB Diabetes Coordinator [phone] (8a-5p)

## 2018-08-25 NOTE — Care Management (Signed)
#   4.   S/W  TAYLOR  @ [place] [facility] # [phone] OPT- 2  1. XARELTO  15 MG BID COVER- YES CO-PAY- ZERO DOLLARS TIER- NO PRIOR APPROVAL- NO  2.  XARELTO  20 MG DAILY COVER- YES CO-PAY- ZERO DOLLARS TIER- NO PRIOR APPROVAL- NO  PREFERRED PHARMACY : YES [facility], [facility] AND [facility]
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
## 2018-08-25 NOTE — Progress Notes (Signed)
Physical Therapy Treatment Patient Details Name: Eric Price MRN: [id number] DOB: 07-Jul-1951 Today's Date: 08/25/2018    History of Present Illness Pt is a 67 y/o male who presents with progressive weakness and SOB for the past 2 weeks. 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 RUE DVT found 11/21.    PT Comments    Pt progressing well towards physical therapy goals. Was able to perform transfers and ambulation with supervision for safety to min guard assist. 1 minor LOB in which min assist was provided for recovery. Pt overall in better spirits today - wife present. Will continue to follow and progress as able per POC.   Follow Up Recommendations  Home health PT;Supervision for mobility/OOB     Equipment Recommendations  None recommended by PT    Recommendations for Other Services       Precautions / Restrictions Precautions Precautions: Fall Precaution Comments: L BKA - has prosthetic and personal cane in room Restrictions Weight Bearing Restrictions: No    Mobility  Bed Mobility Overal bed mobility: Modified Independent Bed Mobility: Supine to Sit           General bed mobility comments: HOB elevated. Pt was able to transition to EOB without difficulty and without assistance.   Transfers Overall transfer level: Needs assistance Equipment used: None Transfers: Sit to/from Stand Sit to Stand: Supervision         General transfer comment: Pt initially stood without SPC and then reached down to pick it up from edge of bed. Supervision for safety provided - no LOB noted.  Ambulation/Gait Ambulation/Gait assistance: Min guard;Min assist Gait Distance (Feet): 225 Feet Assistive device: Straight cane Gait Pattern/deviations: Step-through pattern;Decreased stride length;Decreased weight shift to left Gait  velocity: Decreased Gait velocity interpretation: 1.31 - 2.62 ft/sec, indicative of limited community ambulator General Gait Details: Overall ambulating well with SPC for support and prosthetic donned. Noted 1 small LOB in which min assist was provided to recover. At all other times light min guard assist provided.    Stairs             Wheelchair Mobility    Modified Rankin (Stroke Patients Only)       Balance Overall balance assessment: Needs assistance Sitting-balance support: Feet supported;No upper extremity supported Sitting balance-Leahy Scale: Good     Standing balance support: No upper extremity supported Standing balance-Leahy Scale: Fair Standing balance comment: statically EOB. requires UE support for dynamic standing balance.                             Cognition Arousal/Alertness: Awake/alert Behavior During Therapy: WFL for tasks assessed/performed Overall Cognitive Status: Within Functional Limits for tasks assessed                                        Exercises      General Comments        Pertinent Vitals/Pain Pain Assessment: No/denies pain    Home Living                      Prior Function            PT Goals (current goals can now be found in the care  plan section) Acute Rehab PT Goals Patient Stated Goal: Get back to PLOF - asking for HHPT to resume services PT Goal Formulation: With patient Time For Goal Achievement: 08/31/18 Potential to Achieve Goals: Good Progress towards PT goals: Progressing toward goals    Frequency    Min 3X/week      PT Plan Current plan remains appropriate    Co-evaluation              AM-PAC PT "6 Clicks" Daily Activity  Outcome Measure  Difficulty turning over in bed (including adjusting bedclothes, sheets and blankets)?: None Difficulty moving from lying on back to sitting on the side of the bed? : A Little Difficulty sitting down on and  standing up from a chair with arms (e.g., wheelchair, bedside commode, etc,.)?: A Little Help needed moving to and from a bed to chair (including a wheelchair)?: A Little Help needed walking in hospital room?: A Little Help needed climbing 3-5 steps with a railing? : A Little 6 Click Score: 19    End of Session Equipment Utilized During Treatment: Gait belt Activity Tolerance: Patient tolerated treatment well Patient left: in chair;with call bell/phone within reach;with family/visitor present Nurse Communication: Mobility status PT Visit Diagnosis: Unsteadiness on feet (R26.81);Difficulty in walking, not elsewhere classified (R26.2)     Time: 3837-7939 PT Time Calculation (min) (ACUTE ONLY): 23 min  Charges:  $Gait Training: 23-37 mins                     Rolinda Roan, PT, DPT Acute Rehabilitation Services Pager: [phone] Office: [phone]    Thelma Comp 08/25/2018, 2:00 PM

## 2018-08-26 DIAGNOSIS — I5042 Chronic combined systolic (congestive) and diastolic (congestive) heart failure: Secondary | ICD-10-CM | POA: Diagnosis not present

## 2018-08-26 DIAGNOSIS — N179 Acute kidney failure, unspecified: Secondary | ICD-10-CM | POA: Diagnosis not present

## 2018-08-26 DIAGNOSIS — C8400 Mycosis fungoides, unspecified site: Secondary | ICD-10-CM | POA: Diagnosis not present

## 2018-08-26 DIAGNOSIS — R000 Tachycardia, unspecified: Secondary | ICD-10-CM | POA: Diagnosis not present

## 2018-08-26 DIAGNOSIS — I10 Essential (primary) hypertension: Secondary | ICD-10-CM | POA: Diagnosis not present

## 2018-08-26 DIAGNOSIS — R5381 Other malaise: Secondary | ICD-10-CM | POA: Diagnosis not present

## 2018-08-26 LAB — CBC
HCT: 27.8 % — ABNORMAL LOW (ref 39.0–52.0)
HEMOGLOBIN: 9.2 g/dL — AB (ref 13.0–17.0)
MCH: 27.8 pg (ref 26.0–34.0)
MCHC: 33.1 g/dL (ref 30.0–36.0)
MCV: 84 fL (ref 80.0–100.0)
Platelets: 221 10*3/uL (ref 150–400)
RBC: 3.31 MIL/uL — AB (ref 4.22–5.81)
RDW: 16.8 % — ABNORMAL HIGH (ref 11.5–15.5)
WBC: 7 10*3/uL (ref 4.0–10.5)
nRBC: 0 % (ref 0.0–0.2)

## 2018-08-26 LAB — BASIC METABOLIC PANEL
Anion gap: 10 (ref 5–15)
BUN: 27 mg/dL — AB (ref 8–23)
CO2: 24 mmol/L (ref 22–32)
CREATININE: 1.52 mg/dL — AB (ref 0.61–1.24)
Calcium: 9.4 mg/dL (ref 8.9–10.3)
Chloride: 99 mmol/L (ref 98–111)
GFR calc Af Amer: 53 mL/min — ABNORMAL LOW (ref >60)
GFR, EST NON AFRICAN AMERICAN: 45 mL/min — AB (ref >60)
Glucose, Bld: 199 mg/dL — ABNORMAL HIGH (ref 70–99)
Potassium: 3.5 mmol/L (ref 3.5–5.1)
SODIUM: 133 mmol/L — AB (ref 135–145)

## 2018-08-26 LAB — GLUCOSE, CAPILLARY
GLUCOSE-CAPILLARY: 181 mg/dL — AB (ref 70–99)
Glucose-Capillary: 214 mg/dL — ABNORMAL HIGH (ref 70–99)

## 2018-08-26 MED ORDER — CARVEDILOL 25 MG PO TABS: 37.5000 mg | ORAL_TABLET | Freq: Two times a day (BID) | ORAL | Status: DC

## 2018-08-26 MED ORDER — RIVAROXABAN 20 MG PO TABS: 20.0000 mg | ORAL_TABLET | Freq: Every day | ORAL | 0 refills | Status: DC

## 2018-08-26 MED ORDER — FUROSEMIDE 80 MG PO TABS
80.0000 mg | ORAL_TABLET | Freq: Two times a day (BID) | ORAL | Status: DC
  Administered 2018-08-26: 80 mg via ORAL
  Filled 2018-08-26: qty 1

## 2018-08-26 MED FILL — XARELTO 20 MG TABLET: 20 | 30 days supply | Qty: 30 | Fill #0

## 2018-08-26 NOTE — Discharge Summary (Signed)
Physician Discharge Summary  Eric Price GYK:[id number] DOB: Aug 21, 1951 DOA: 08/23/2018  PCP: [name], MD  Admit date: 08/23/2018 Discharge date: 08/26/2018  Admitted From: home Discharge disposition: home   Recommendations for Outpatient Follow-Up:   1. Resume home health 2. PICC line removal as soon as possible 3. Adjust medication based on renal fxn 4. Cbc, BMP 1 week   Discharge Diagnosis:   Principal Problem:   AKI (acute kidney injury) ([facility]) Active Problems:   HTN (hypertension)   HLD (hyperlipidemia)   Mycosis fungoides (HCC)   Type 2 diabetes mellitus (HCC)   CAD (coronary artery disease)   Chronic combined systolic and diastolic CHF (congestive heart failure) (HCC)   PAD (peripheral artery disease) (HCC)   Leukocytosis   Tachycardia   Tremor of both hands   Chronic anemia   Diabetic neuropathy (HCC)   GERD (gastroesophageal reflux disease)   Physical deconditioning    Discharge Condition: Improved.  Diet recommendation: Low sodium, heart healthy.  Carbohydrate-modified  Wound care: None.  Code status: Full.   History of Present Illness:   Eric Price is a 67 y.o. 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  Patient is presenting with a 2-week history of dyspnea and generalized weakness.  Also reports having hand tremors.  Reports feeling short of breath even at rest.  Denies having any fevers but does report having chills.  States he has been coughing and sometimes the cough is productive of beige-colored sputum.  Denies having any chest pain.  States his appetite has been low.  Denies having any nausea, vomiting, abdominal  pain, or diarrhea.  He is currently taking Lasix 80 mg twice daily at home.  Reports compliance with home Eliquis.   Hospital Course by Problem:   AKI on CKD stage III -s/p Gentle IV fluid hydration(history of CHF) -CR much improved -resume diuretic  Mild leukocytosis -resolved  Fever (11/20) -blood cultures NGTD -has PICC line on right (has been in for around 4 months) -duplex shows blood clot- ? subacute -vascular consult: as patient needs continued PICC Line, plan to change eliquis to xarelto and PICC Line removal as soon as possible -no recurrent fever  Bilateral hand tremors -Recently check TSH and free T4 were normal -recent increased in lyrica with worsening kidney function?? -resolved  Anemia- unknown etiology -Fe appears to be low-- s/p IV Fe and monitor outpatient -stool negative -baseline around 11  Type 2 diabetes -Blood glucose 277 on admission. A1c 7.3 on July 12, 2018. -resume home meds  Diabetic neuropathy -resume lyrica  GERD -Continue PPI  Chronic combined systolic and diastolic CHF -resume lasix at lower dose but will daily weights and close follow up with cardiology  CAD -Stable. Continue home beta-blocker, statin.  Hyponatremia -due to dehydration -improved with IVF  Hyperlipidemia -Continue home Lipitor  Peripheral arterial disease -Angiogram done in May 2019 revealed his profunda femoris to have thrombotic occlusion as well as his tibial arteries. He was placed on Eliquis at that time -Continue  Lipitor -eliquis changed to xarelto  Mycosis fungoides -Followed by oncology at [facility]. Currently on chemo. Last office visit August 15, 2018.  Physical deconditioning -Continue home feeding supplement -PT evaluation- home health  obesity Estimated body mass index is  32.19 kg/m as calculated from the following:   Height as of this encounter: 5\' 9"  (1.753 m).   Weight as of this encounter: 98.9  kg.    Medical Consultants:      Discharge Exam:   Vitals:   08/26/18 0051 08/26/18 0613  BP: (!) 144/81 (!) 101/55  Pulse: 64 (!) 107  Resp: 16 16  Temp: 97.7 F (36.5 C) 98 F (36.7 C)  SpO2: 96% 97%   Vitals:   08/25/18 1200 08/25/18 1717 08/26/18 0051 08/26/18 0613  BP: (!) 103/57 111/69 (!) 144/81 (!) 101/55  Pulse: (!) 107 (!) 109 64 (!) 107  Resp: 20 18 16 16   Temp:  99.6 F (37.6 C) 97.7 F (36.5 C) 98 F (36.7 C)  TempSrc: Oral Oral Oral Oral  SpO2: 97% 100% 96% 97%  Weight:      Height:        General exam: Appears calm and comfortable.     The results of significant diagnostics from this hospitalization (including imaging, microbiology, ancillary and laboratory) are listed below for reference.     Procedures and Diagnostic Studies:   Dg Chest 2 View  Result Date: 08/23/2018 CLINICAL DATA:  Dyspnea EXAM: CHEST - 2 VIEW COMPARISON:  07/08/2018 chest radiograph. FINDINGS: Stable configuration of single lead left subclavian ICD. Right PICC terminates in the lower third of the SVC. Surgical clips overlie the right axilla. Stable cardiomediastinal silhouette with top-normal heart size. No pneumothorax. No pleural effusion. No overt pulmonary edema. No acute consolidative airspace disease. IMPRESSION: No active cardiopulmonary disease. Electronically Signed   By: Ilona Sorrel M.D.   On: 08/23/2018 17:33   US Renal  Result Date: 08/24/2018 CLINICAL DATA:  Acute kidney injury. History of diabetes and hypertension. EXAM: RENAL / URINARY TRACT ULTRASOUND COMPLETE COMPARISON:  None. FINDINGS: Right Kidney: Renal measurements: 11.8 x 5.4 x 4.7 cm = volume: 159 mL . Echogenicity within normal limits. No mass or hydronephrosis visualized. Left Kidney: Renal measurements: 11.4 x 4.4 x 4.1 cm = volume: 110 mL. Echogenicity within normal limits. No mass or hydronephrosis visualized. Bladder: Appears normal for degree of bladder distention. IMPRESSION: Normal ultrasound  appearance of the kidneys and bladder. No hydronephrosis. Electronically Signed   By: Lucienne Capers M.D.   On: 08/24/2018 04:40     Labs:   Basic Metabolic Panel: Recent Labs  Lab 08/23/18 1647 08/24/18 0524 08/25/18 0435 08/26/18 0555  NA 131* 132* 134* 133*  K 3.8 3.6 3.5 3.5  CL 94* 96* 98 99  CO2 24 24 27 24   GLUCOSE 277* 224* 219* 199*  BUN 56* 51* 36* 27*  CREATININE 2.84* 2.33* 1.67* 1.52*  CALCIUM 9.2 9.2 9.1 9.4  MG 1.8  --   --   --    GFR Estimated Creatinine Clearance: 53.9 mL/min (A) (by C-G formula based on SCr of 1.52 mg/dL (H)). Liver Function Tests: Recent Labs  Lab 08/23/18 1647  AST 40  ALT 40  ALKPHOS 105  BILITOT 0.7  PROT 6.5  ALBUMIN 3.6   No results for input(s): LIPASE, AMYLASE in the last 168 hours. No results for input(s): AMMONIA in the last 168 hours. Coagulation profile No results for input(s): INR, PROTIME in the last 168 hours.  CBC: Recent Labs  Lab 08/23/18 1647 08/24/18 0524 08/25/18 0435 08/26/18 0555  WBC 11.9* 9.0 8.5 7.0  NEUTROABS 10.6*  --   --   --   HGB 9.6* 9.2* 8.7* 9.2*  HCT 29.8* 28.3*  28.2* 27.8*  MCV 84.9 84.2 86.2 84.0  PLT 227 221 218 221   Cardiac Enzymes: No results for input(s): CKTOTAL, CKMB, CKMBINDEX, TROPONINI in the last 168 hours. BNP: Invalid input(s): POCBNP CBG: Recent Labs  Lab 08/25/18 1152 08/25/18 1635 08/25/18 2148 08/26/18 0653 08/26/18 0815  GLUCAP 311* 330* 271* 181* 214*   D-Dimer No results for input(s): DDIMER in the last 72 hours. Hgb A1c No results for input(s): HGBA1C in the last 72 hours. Lipid Profile No results for input(s): CHOL, HDL, LDLCALC, TRIG, CHOLHDL, LDLDIRECT in the last 72 hours. Thyroid function studies No results for input(s): TSH, T4TOTAL, T3FREE, THYROIDAB in the last 72 hours.  Invalid input(s): FREET3 Anemia work up Recent Labs    08/24/18 1402  FERRITIN 874*  TIBC 305  IRON 28*   Microbiology Recent Results (from the past 240  hour(s))  Culture, blood (Routine X 2) w Reflex to ID Panel     Status: None (Preliminary result)   Collection Time: 08/24/18  8:50 PM  Result Value Ref Range Status   Specimen Description BLOOD LEFT HAND  Final   Special Requests   Final    BOTTLES DRAWN AEROBIC ONLY Blood Culture adequate volume   Culture   Final    NO GROWTH < 24 HOURS Performed at [facility], [address] [place], [place] [id number]    Report Status PENDING  Incomplete     Discharge Instructions:   Discharge Instructions    (HEART FAILURE PATIENTS) Call MD:  Anytime you have any of the following symptoms: 1) 3 pound weight gain in 24 hours or 5 pounds in 1 week 2) shortness of breath, with or without a dry hacking cough 3) swelling in the hands, feet or stomach 4) if you have to sleep on extra pillows at night in order to breathe.   Complete by:  As directed    Diet - low sodium heart healthy   Complete by:  As directed    Diet Carb Modified   Complete by:  As directed    Discharge instructions   Complete by:  As directed    Cbc, bmp 1 week-- may need higher dose of lasix after labs drawn vs if weight increasing-- keep in close contact with Dr. Loletha Grayer Bring log of weights to PCP-- if weight increases by 3lbs in 24 hours increase lasix back to BID Resume home health   Increase activity slowly   Complete by:  As directed      Allergies as of 08/26/2018      Reactions   Morphine Shortness Of Breath, Anaphylaxis   Morphine And Related    "took my breath away"   Oxycodone    Pt stated, "It makes me climbs walls; fight wars"      Medication List    STOP taking these medications   clobetasol cream 0.05 % Commonly known as:  TEMOVATE   ELIQUIS 5 MG Tabs tablet Generic drug:  apixaban   fenofibrate 145 MG tablet Commonly known as:  TRICOR   folic acid 1 MG tablet Commonly known as:  FOLVITE   lisinopril 2.5 MG tablet Commonly known as:  PRINIVIL,ZESTRIL   metFORMIN 1000 MG tablet Commonly  known as:  GLUCOPHAGE   metolazone 2.5 MG tablet Commonly known as:  ZAROXOLYN   nitroGLYCERIN 0.4 MG SL tablet Commonly known as:  NITROSTAT   spironolactone 25 MG tablet Commonly known as:  ALDACTONE     TAKE these medications  ACCU-CHEK AVIVA device Use to check blood sugar 6 times daily   acetaminophen 500 MG tablet Commonly known as:  TYLENOL Take 1,000 mg by mouth every 8 (eight) hours as needed for mild pain.   albuterol 108 (90 Base) MCG/ACT inhaler Commonly known as:  PROVENTIL HFA;VENTOLIN HFA Inhale 2 puffs into the lungs every 4 (four) hours as needed for wheezing or shortness of breath.   atorvastatin 20 MG tablet Commonly known as:  LIPITOR TAKE 1 TABLET BY MOUTH EVERY DAY AT 6 What changed:    how much to take  how to take this  when to take this  additional instructions   carvedilol 25 MG tablet Commonly known as:  COREG Take 1.5 tablets (37.5 mg total) by mouth 2 (two) times daily with a meal. What changed:  how much to take   DOCQLACE 100 MG capsule Generic drug:  docusate sodium TAKE 1 CAPSULE BY MOUTH DAILY What changed:    how much to take  how to take this  when to take this   Dulaglutide 1.5 MG/0.5ML Sopn INJECT CONTENTS OF ONE PEN UNDER THE SKIN ONCE A WEEK What changed:    how much to take  how to take this  when to take this  additional instructions   ENSURE COMPLETE PO Take by mouth. What changed:  Another medication with the same name was removed. Continue taking this medication, and follow the directions you see here.   furosemide 80 MG tablet Commonly known as:  LASIX Take 1 tablet (80 mg total) by mouth daily. What changed:    when to take this  additional instructions   glucagon 1 MG injection Inject 1 mg into the vein once as needed (for high blood sugar).   glucose blood test strip Use to check blood sugar 6 times a day   hydrOXYzine 25 MG tablet Commonly known as:  ATARAX/VISTARIL TAKE 1- 3  TABLETS BY MOUTH TWICE DAILY AS NEEDED FOR ITCHING What changed:  See the new instructions.   insulin aspart 100 UNIT/ML FlexPen Commonly known as:  NOVOLOG INJECT 34-40 UNITS BEFORE the 3 meals of the day What changed:    how much to take  how to take this  when to take this  additional instructions   Insulin Detemir 100 UNIT/ML Pen Commonly known as:  LEVEMIR ADMINISTER 50 UNITS UNDER THE SKIN TWICE DAILY What changed:    how much to take  how to take this  when to take this  additional instructions   nystatin 100000 UNIT/ML suspension Commonly known as:  MYCOSTATIN Take 5 mLs by mouth 4 (four) times daily.   ONE TOUCH LANCETS Misc Check 2 times daily.   pantoprazole 40 MG tablet Commonly known as:  PROTONIX Take 1 tablet (40 mg total) by mouth daily.   pregabalin 150 MG capsule Commonly known as:  LYRICA Take 1 capsule (150 mg total) by mouth 2 (two) times daily.   promethazine 12.5 MG tablet Commonly known as:  PHENERGAN Take 12.5 mg by mouth every 6 (six) hours as needed for nausea or vomiting.   rivaroxaban 20 MG Tabs tablet Commonly known as:  XARELTO Take 1 tablet (20 mg total) by mouth daily with supper.   tiZANidine 2 MG tablet Commonly known as:  ZANAFLEX TAKE 1 TABLET(2 MG) BY MOUTH AT BEDTIME AS NEEDED FOR MUSCLE SPASMS What changed:  See the new instructions.   traMADol 50 MG tablet Commonly known as:  ULTRAM Take 50 mg by  mouth every 6 (six) hours as needed for moderate pain.         Time coordinating discharge: 25 min  Signed:  Geradine Girt DO  [facility] 08/26/2018, 9:58 AM

## 2018-08-29 ENCOUNTER — Telehealth: Payer: Self-pay | Admitting: *Deleted

## 2018-08-29 LAB — CULTURE, BLOOD (ROUTINE X 2)
Culture: NO GROWTH
Special Requests: ADEQUATE

## 2018-08-29 NOTE — Telephone Encounter (Signed)
Transition Care Management Follow-up Telephone Call  Admit date: 08/23/2018 Discharge date: 08/26/2018  Admitted From: home Discharge disposition: home   Recommendations for Outpatient Follow-Up:   1. Resume home health 2. PICC line removal as soon as possible 3. Adjust medication based on renal fxn 4. Cbc, BMP 1 week     How have you been since you were released from the hospital? Pretty good, not bad   Do you understand why you were in the hospital? yes   Do you understand the discharge instructions? yes   Where were you discharged to? home   Items Reviewed:  Medications reviewed: yes  Allergies reviewed: yes  Dietary changes reviewed: yes  Referrals reviewed: yes   Functional Questionnaire:   Activities of Daily Living (ADLs):   He states they are independent in the following: none States they require assistance with the following: none   Any transportation issues/concerns?: no   Any patient concerns? no   Confirmed importance and date/time of follow-up visits scheduled yes  Provider Appointment booked with Dr Elease Hashimoto 08/30/18 at 9:20 am  Confirmed with patient if condition begins to worsen call PCP or go to the ER.  Patient was given the office number and encouraged to call back with question or concerns.  : yes

## 2018-08-30 ENCOUNTER — Ambulatory Visit (INDEPENDENT_AMBULATORY_CARE_PROVIDER_SITE_OTHER): Payer: Medicare HMO | Admitting: Family Medicine

## 2018-08-30 ENCOUNTER — Encounter: Payer: Self-pay | Admitting: Family Medicine

## 2018-08-30 VITALS — BP 100/54 | HR 102 | Temp 98.5°F | Wt 216.8 lb

## 2018-08-30 DIAGNOSIS — E1122 Type 2 diabetes mellitus with diabetic chronic kidney disease: Secondary | ICD-10-CM

## 2018-08-30 DIAGNOSIS — C8400 Mycosis fungoides, unspecified site: Secondary | ICD-10-CM | POA: Diagnosis not present

## 2018-08-30 DIAGNOSIS — M792 Neuralgia and neuritis, unspecified: Secondary | ICD-10-CM | POA: Diagnosis not present

## 2018-08-30 DIAGNOSIS — G8929 Other chronic pain: Secondary | ICD-10-CM | POA: Diagnosis not present

## 2018-08-30 DIAGNOSIS — N189 Chronic kidney disease, unspecified: Secondary | ICD-10-CM | POA: Diagnosis not present

## 2018-08-30 MED ORDER — RIVAROXABAN 20 MG PO TABS: 20.0000 mg | ORAL_TABLET | Freq: Every day | ORAL | 5 refills | Status: DC
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
## 2018-08-30 NOTE — Patient Instructions (Signed)
Try reducing the Lyrica to 100 mg in the AM and 150 mg at night  After one week, if no breakthrough neuropathy pain, decrease further to 100 mg in AM and 100 mg in PM

## 2018-08-31 ENCOUNTER — Encounter: Payer: Self-pay | Admitting: Cardiovascular Disease

## 2018-08-31 ENCOUNTER — Ambulatory Visit (INDEPENDENT_AMBULATORY_CARE_PROVIDER_SITE_OTHER): Payer: Medicare HMO | Admitting: Cardiovascular Disease

## 2018-08-31 VITALS — BP 98/58 | HR 107 | Ht 69.0 in | Wt 218.0 lb

## 2018-08-31 DIAGNOSIS — Z452 Encounter for adjustment and management of vascular access device: Secondary | ICD-10-CM | POA: Diagnosis not present

## 2018-08-31 DIAGNOSIS — Z794 Long term (current) use of insulin: Secondary | ICD-10-CM

## 2018-08-31 DIAGNOSIS — E1122 Type 2 diabetes mellitus with diabetic chronic kidney disease: Secondary | ICD-10-CM

## 2018-08-31 DIAGNOSIS — I5042 Chronic combined systolic (congestive) and diastolic (congestive) heart failure: Secondary | ICD-10-CM | POA: Diagnosis not present

## 2018-08-31 DIAGNOSIS — I739 Peripheral vascular disease, unspecified: Secondary | ICD-10-CM

## 2018-08-31 DIAGNOSIS — Z95810 Presence of automatic (implantable) cardiac defibrillator: Secondary | ICD-10-CM | POA: Diagnosis not present

## 2018-08-31 DIAGNOSIS — C8400 Mycosis fungoides, unspecified site: Secondary | ICD-10-CM | POA: Diagnosis not present

## 2018-08-31 DIAGNOSIS — I4729 Other ventricular tachycardia: Secondary | ICD-10-CM

## 2018-08-31 DIAGNOSIS — T827XXA Infection and inflammatory reaction due to other cardiac and vascular devices, implants and grafts, initial encounter: Secondary | ICD-10-CM | POA: Diagnosis not present

## 2018-08-31 DIAGNOSIS — I472 Ventricular tachycardia: Secondary | ICD-10-CM | POA: Diagnosis not present

## 2018-08-31 DIAGNOSIS — I5043 Acute on chronic combined systolic (congestive) and diastolic (congestive) heart failure: Secondary | ICD-10-CM | POA: Diagnosis not present

## 2018-08-31 DIAGNOSIS — I2510 Atherosclerotic heart disease of native coronary artery without angina pectoris: Secondary | ICD-10-CM

## 2018-08-31 DIAGNOSIS — A4181 Sepsis due to Enterococcus: Secondary | ICD-10-CM | POA: Diagnosis not present

## 2018-08-31 DIAGNOSIS — I10 Essential (primary) hypertension: Secondary | ICD-10-CM

## 2018-08-31 DIAGNOSIS — I110 Hypertensive heart disease with heart failure: Secondary | ICD-10-CM | POA: Diagnosis not present

## 2018-08-31 DIAGNOSIS — N179 Acute kidney failure, unspecified: Secondary | ICD-10-CM | POA: Diagnosis not present

## 2018-08-31 DIAGNOSIS — N183 Chronic kidney disease, stage 3 unspecified: Secondary | ICD-10-CM

## 2018-08-31 DIAGNOSIS — D509 Iron deficiency anemia, unspecified: Secondary | ICD-10-CM

## 2018-08-31 DIAGNOSIS — I428 Other cardiomyopathies: Secondary | ICD-10-CM | POA: Diagnosis not present

## 2018-08-31 DIAGNOSIS — E785 Hyperlipidemia, unspecified: Secondary | ICD-10-CM

## 2018-08-31 DIAGNOSIS — Z89512 Acquired absence of left leg below knee: Secondary | ICD-10-CM | POA: Diagnosis not present

## 2018-08-31 DIAGNOSIS — R6520 Severe sepsis without septic shock: Secondary | ICD-10-CM | POA: Diagnosis not present

## 2018-08-31 MED ORDER — POTASSIUM CHLORIDE CRYS ER 20 MEQ PO TBCR: 20.0000 meq | EXTENDED_RELEASE_TABLET | Freq: Every day | ORAL | 3 refills | Status: DC | PRN

## 2018-08-31 NOTE — Patient Instructions (Addendum)
Medication Instructions:  Dr Sallyanne Kuster has recommended making the following medication changes: 1. TAKE FUROSEMIDE BASED ON YOUR WEIGHT If weight is 210 pounds or more take 80 mg twice daily If weight is 207-209 pounds take 80 mg once daily If weight is 206 pounds or less HOLD furosemide 2. TAKE one Potassium 20 mEq tablet ONLY on days you have to take Furosemide TWICE  Your physician recommends that you weigh, daily, at the same time every day, and in the same amount of clothing. Please record your daily weights on the handout provided and bring it to your next appointment.  If you need a refill on your cardiac medications before your next appointment, please call your pharmacy.   Testing/Procedures: Remote monitoring is used to monitor your Pacemaker of ICD from home. This monitoring reduces the number of office visits required to check your device to one time per year. It allows [place] to keep an eye on the functioning of your device to ensure it is working properly. You are scheduled for a device check from home on Tuesday, December 3rd, 2020. You may send your transmission at any time that day. If you have a wireless device, the transmission will be sent automatically. After your physician reviews your transmission, you will receive a postcard with your next transmission date.  Follow-Up:  Your physician recommends that you schedule a follow-up appointment in 4 weeks with a NP/PA.  Dr Sallyanne Kuster recommends that you schedule a follow-up appointment in 3 months.
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
## 2018-09-02 ENCOUNTER — Telehealth: Payer: Self-pay | Admitting: Family Medicine

## 2018-09-02 NOTE — Telephone Encounter (Unsigned)
Copied from [place] [phone]. Topic: Quick Communication - Home Health Verbal Orders >> Sep 02, 2018 10:04 AM Carolyn Stare wrote: Eric Price with  [facility] Number   [phone]  Requesting  verbal to continue Skilled Nursing  Frequency   1 x 2

## 2018-09-05 ENCOUNTER — Telehealth: Payer: Self-pay

## 2018-09-05 DIAGNOSIS — R5383 Other fatigue: Secondary | ICD-10-CM | POA: Diagnosis not present

## 2018-09-05 DIAGNOSIS — R52 Pain, unspecified: Secondary | ICD-10-CM | POA: Diagnosis not present

## 2018-09-05 DIAGNOSIS — Z923 Personal history of irradiation: Secondary | ICD-10-CM | POA: Diagnosis not present

## 2018-09-05 DIAGNOSIS — C8400 Mycosis fungoides, unspecified site: Secondary | ICD-10-CM | POA: Diagnosis not present

## 2018-09-05 DIAGNOSIS — Z5111 Encounter for antineoplastic chemotherapy: Secondary | ICD-10-CM | POA: Diagnosis not present

## 2018-09-05 NOTE — Telephone Encounter (Signed)
Please see message. Please advise. I see a message from 09/02/18 but not sure if this was sent to you? Copied and pasted below:  Copied from [place] [phone]. Topic: Quick Communication - Home Health Verbal Orders >> Sep 02, 2018 10:04 AM Carolyn Stare wrote: Farrel Gobble with  [place] Number   [phone]  Requesting  verbal to continue Skilled Nursing  Frequency   1 x 2   Copied from [place] [phone]. Topic: General - Other >> Aug 31, 2018  1:58 PM Leward Quan A wrote: Reason for CRM: Patient called to say that per [place] they have yet to receive paper work from Dr Elease Hashimoto so that home care nurse can go out and help patient with dressing for PIC line. Please advise Ph# [phone]

## 2018-09-05 NOTE — Telephone Encounter (Signed)
Called [place] and left a detailed message to give her the OK per Dr. Elease Hashimoto for orders requested.

## 2018-09-05 NOTE — Telephone Encounter (Signed)
OK to set up 

## 2018-09-05 NOTE — Telephone Encounter (Signed)
This is the message that I was looking for earlier.   Please advise.

## 2018-09-05 NOTE — Telephone Encounter (Signed)
OK to set up order - as requested.

## 2018-09-05 NOTE — Telephone Encounter (Signed)
Called Angel with [facility] and left a detailed message and gave the OK per Dr. Elease Hashimoto per orders requested.

## 2018-09-06 ENCOUNTER — Ambulatory Visit (INDEPENDENT_AMBULATORY_CARE_PROVIDER_SITE_OTHER): Payer: Medicare HMO

## 2018-09-06 DIAGNOSIS — C8400 Mycosis fungoides, unspecified site: Secondary | ICD-10-CM | POA: Diagnosis not present

## 2018-09-06 DIAGNOSIS — Z95810 Presence of automatic (implantable) cardiac defibrillator: Secondary | ICD-10-CM

## 2018-09-06 DIAGNOSIS — Z5111 Encounter for antineoplastic chemotherapy: Secondary | ICD-10-CM | POA: Diagnosis not present

## 2018-09-06 DIAGNOSIS — I5042 Chronic combined systolic (congestive) and diastolic (congestive) heart failure: Secondary | ICD-10-CM | POA: Diagnosis not present

## 2018-09-06 NOTE — Progress Notes (Signed)
EPIC Encounter for ICM Monitoring  Patient Name: Eric Price is a 67 y.o. male Date: 09/06/2018 Primary Care Physican: [name], MD Primary Cardiologist:Berry Electrophysiologist:Cro[place] Last Weight: 210lbs(home weight at time of discharge)  Today's Weight: 214 lbs       Heart Failure questions reviewed, pt asymptomatic.  Patient is following Furosemide sliding scale and has been taking 80 mg bid for the last several days since with is 214 lbs.  Per 08/31/2018 office note patient became hypovolemic after taking prescribed 1 x dose of Metolazone.    Thoracic impedance abnormal suggesting fluid accumulation starting 08/26/2018.         Prescribed by Dr Sallyanne Kuster 08/31/2018 per note but not reflected in med list.         1. FUROSEMIDE BASED ON YOUR WEIGHT If weight is 210 pounds or more take 80 mg twice daily If weight is 207-209 pounds take 80 mg once daily If weight is 206 pounds or less HOLD furosemide 2. TAKE one Potassium 20 mEq tablet ONLY on days you have to take Furosemide TWICE  Labs: 08/26/2018 Creatinine 1.52, BUN 27, Potassium 3.5, Sodium 133, eGFR 45-53 08/25/2018 Creatinine 1.67, BUN 36, Potassium 3.5, Sodium 134, eGFR 40-47  08/24/2018 Creatinine 2.33, BUN 51, Potassium 3.6, Sodium 132, eGFR 27-31  08/23/2018 Creatinine 2.84, BUN 56, Potassium 3.8, Sodium 131, eGFR 21-25  07/25/2018 Creatinine 1.6, BUN 25. Potassium 3.6, Sodium 140, Care Everywhere 07/08/2018 Creatinine 1.40, BUN 23, Potassium 3.4, Sodium 136, eGFR 50-58 06/15/2018 Creatinine 1.77, BUN 30, Potassium 4.3, Sodium 135, EGFR 49.45 04/18/2018 Creatinine 1.22, BUN 15, Potassium 4.4, Sodium 139, EGFR 76.01  Recommendations:  Discussed limiting salt intake to < 2000 mg daily.  Encouraged to call for fluid symptoms.  Follow-up plan: ICM clinic phone appointment on 09/15/2018 to recheck fluid levels.   Office appointment scheduled 09/29/2018 with Almyra Deforest, PA.    Copy of ICM check sent to Dr.  Gwenlyn Found and Dr Sallyanne Kuster for review and recommendations if needed.   3 month ICM trend: 09/06/2018    1 Year ICM trend:              Rosalene Billings, RN 09/06/2018 7:58 AM

## 2018-09-07 DIAGNOSIS — Z89512 Acquired absence of left leg below knee: Secondary | ICD-10-CM | POA: Diagnosis not present

## 2018-09-07 NOTE — Progress Notes (Signed)
Thanks, I think we'll stick to the current diuretic plan MCr

## 2018-09-08 ENCOUNTER — Telehealth: Payer: Self-pay | Admitting: Family Medicine

## 2018-09-08 DIAGNOSIS — T827XXA Infection and inflammatory reaction due to other cardiac and vascular devices, implants and grafts, initial encounter: Secondary | ICD-10-CM | POA: Diagnosis not present

## 2018-09-08 DIAGNOSIS — Z6832 Body mass index (BMI) 32.0-32.9, adult: Secondary | ICD-10-CM | POA: Diagnosis not present

## 2018-09-08 DIAGNOSIS — I130 Hypertensive heart and chronic kidney disease with heart failure and stage 1 through stage 4 chronic kidney disease, or unspecified chronic kidney disease: Secondary | ICD-10-CM | POA: Diagnosis not present

## 2018-09-08 DIAGNOSIS — I2510 Atherosclerotic heart disease of native coronary artery without angina pectoris: Secondary | ICD-10-CM | POA: Diagnosis not present

## 2018-09-08 DIAGNOSIS — I428 Other cardiomyopathies: Secondary | ICD-10-CM | POA: Diagnosis not present

## 2018-09-08 DIAGNOSIS — E669 Obesity, unspecified: Secondary | ICD-10-CM | POA: Diagnosis not present

## 2018-09-08 DIAGNOSIS — E1151 Type 2 diabetes mellitus with diabetic peripheral angiopathy without gangrene: Secondary | ICD-10-CM | POA: Diagnosis not present

## 2018-09-08 NOTE — Telephone Encounter (Signed)
Copied from [place] [id number]. Topic: Quick Communication - Home Health Verbal Orders >> Sep 08, 2018 12:50 PM Berneta Levins wrote: Caller/Agency: Morey Hummingbird with [facility] Number: [phone], OK to leave a message Requesting OT/PT/Skilled Nursing/Social Work: re-certify for home health Frequency: 2x a month for 2 months

## 2018-09-12 DIAGNOSIS — R69 Illness, unspecified: Secondary | ICD-10-CM | POA: Diagnosis not present

## 2018-09-12 NOTE — Telephone Encounter (Signed)
ok 

## 2018-09-12 NOTE — Telephone Encounter (Signed)
Called Carrie and gave her the verbal OK per Dr. Burchette. Carrie verbalized an understanding. 

## 2018-09-14 ENCOUNTER — Telehealth: Payer: Self-pay | Admitting: Internal Medicine

## 2018-09-14 DIAGNOSIS — I428 Other cardiomyopathies: Secondary | ICD-10-CM | POA: Diagnosis not present

## 2018-09-14 DIAGNOSIS — Z6832 Body mass index (BMI) 32.0-32.9, adult: Secondary | ICD-10-CM | POA: Diagnosis not present

## 2018-09-14 DIAGNOSIS — T827XXA Infection and inflammatory reaction due to other cardiac and vascular devices, implants and grafts, initial encounter: Secondary | ICD-10-CM | POA: Diagnosis not present

## 2018-09-14 DIAGNOSIS — E669 Obesity, unspecified: Secondary | ICD-10-CM | POA: Diagnosis not present

## 2018-09-14 DIAGNOSIS — I2510 Atherosclerotic heart disease of native coronary artery without angina pectoris: Secondary | ICD-10-CM | POA: Diagnosis not present

## 2018-09-14 DIAGNOSIS — E1151 Type 2 diabetes mellitus with diabetic peripheral angiopathy without gangrene: Secondary | ICD-10-CM | POA: Diagnosis not present

## 2018-09-14 DIAGNOSIS — I130 Hypertensive heart and chronic kidney disease with heart failure and stage 1 through stage 4 chronic kidney disease, or unspecified chronic kidney disease: Secondary | ICD-10-CM | POA: Diagnosis not present

## 2018-09-14 NOTE — Telephone Encounter (Signed)
Noted. Let me know if sugars remain high in few days.

## 2018-09-14 NOTE — Telephone Encounter (Signed)
[facility] called reporting at 12 o'clock blood sugars were 278 and patient took 34 units of Novolog. Nurse will report back back if it does not get lower. Patients heart rate was also 108. Please Advise, thanks

## 2018-09-15 ENCOUNTER — Telehealth: Payer: Self-pay | Admitting: Cardiology

## 2018-09-15 ENCOUNTER — Ambulatory Visit (INDEPENDENT_AMBULATORY_CARE_PROVIDER_SITE_OTHER): Payer: Medicare HMO

## 2018-09-15 DIAGNOSIS — Z95810 Presence of automatic (implantable) cardiac defibrillator: Secondary | ICD-10-CM

## 2018-09-15 DIAGNOSIS — I5042 Chronic combined systolic (congestive) and diastolic (congestive) heart failure: Secondary | ICD-10-CM

## 2018-09-15 NOTE — Telephone Encounter (Signed)
LMOVM reminding pt to send remote transmission.   

## 2018-09-16 ENCOUNTER — Telehealth: Payer: Self-pay

## 2018-09-16 NOTE — Progress Notes (Signed)
He almost got back to normal.  Thank you, agree with advice MCr

## 2018-09-16 NOTE — Telephone Encounter (Signed)
Remote ICM transmission received.  Attempted call to patient regarding ICM remote transmission and left detailed message, per DPR, to return call.    

## 2018-09-16 NOTE — Progress Notes (Signed)
EPIC Encounter for ICM Monitoring  Patient Name: Shaan Rhoads is a 67 y.o. male Date: 09/16/2018 Primary Care Physican: [name], MD Primary Cardiologist:Berry Electrophysiologist:Croitoru Last VTVNRW:[id number](IPJR weight at time of discharge)        Today's Weight: unknown                                                           Attempted call to patient and unable to reach.  Left message to call back with updated weight and how he is feeling.  Discharge weight was 210 lbs.  Per 08/31/2018 office note patient became hypovolemic after taking prescribed 1 x dose of Metolazone.    Thoracic impedance much improved since last remote transmission on 09/06/2018 but still remains slightly abnormal        Prescribed by Dr Sallyanne Kuster 08/31/2018 per note but not reflected in med list.         1. FUROSEMIDE BASED ON YOUR WEIGHT If weight is 210 pounds or more take 80 mg twice daily If weight is 207-209 pounds take 80 mg once daily If weight is 206 pounds or less HOLD furosemide 2. TAKE one Potassium 20 mEq tablet ONLY on days you have to take Furosemide TWICE  Labs: 08/26/2018 Creatinine 1.52, BUN 27, Potassium 3.5, Sodium 133, eGFR 45-53 08/25/2018 Creatinine 1.67, BUN 36, Potassium 3.5, Sodium 134, eGFR 40-47  08/24/2018 Creatinine 2.33, BUN 51, Potassium 3.6, Sodium 132, eGFR 27-31  08/23/2018 Creatinine 2.84, BUN 56, Potassium 3.8, Sodium 131, eGFR 21-25  07/25/2018 Creatinine 1.6, BUN 25. Potassium 3.6, Sodium 140, Care Everywhere 07/08/2018 Creatinine 1.40, BUN 23, Potassium 3.4, Sodium 136, eGFR 50-58 06/15/2018 Creatinine 1.77, BUN 30, Potassium 4.3, Sodium 135, EGFR 49.45 04/18/2018 Creatinine 1.22, BUN 15, Potassium 4.4, Sodium 139, EGFR 76.01  Recommendations:  None, left message for patient to return call.  Follow-up plan: ICM clinic phone appointment on 09/27/2018 to recheck fluid levels before office appointment scheduled 09/29/2018 with Almyra Deforest, PA.    Copy of  ICM check sent to Dr. Gwenlyn Found and Dr Sallyanne Kuster.  3 month ICM trend: 09/15/2018    1 Year ICM trend:       Rosalene Billings, RN 09/16/2018 11:18 AM

## 2018-09-21 DIAGNOSIS — I2510 Atherosclerotic heart disease of native coronary artery without angina pectoris: Secondary | ICD-10-CM | POA: Diagnosis not present

## 2018-09-21 DIAGNOSIS — T827XXA Infection and inflammatory reaction due to other cardiac and vascular devices, implants and grafts, initial encounter: Secondary | ICD-10-CM | POA: Diagnosis not present

## 2018-09-21 DIAGNOSIS — Z6832 Body mass index (BMI) 32.0-32.9, adult: Secondary | ICD-10-CM | POA: Diagnosis not present

## 2018-09-21 DIAGNOSIS — E1151 Type 2 diabetes mellitus with diabetic peripheral angiopathy without gangrene: Secondary | ICD-10-CM | POA: Diagnosis not present

## 2018-09-21 DIAGNOSIS — I130 Hypertensive heart and chronic kidney disease with heart failure and stage 1 through stage 4 chronic kidney disease, or unspecified chronic kidney disease: Secondary | ICD-10-CM | POA: Diagnosis not present

## 2018-09-21 DIAGNOSIS — I428 Other cardiomyopathies: Secondary | ICD-10-CM | POA: Diagnosis not present

## 2018-09-21 DIAGNOSIS — E669 Obesity, unspecified: Secondary | ICD-10-CM | POA: Diagnosis not present

## 2018-09-26 ENCOUNTER — Other Ambulatory Visit: Payer: Self-pay | Admitting: Cardiovascular Disease

## 2018-09-26 DIAGNOSIS — I2510 Atherosclerotic heart disease of native coronary artery without angina pectoris: Secondary | ICD-10-CM | POA: Diagnosis not present

## 2018-09-26 DIAGNOSIS — I428 Other cardiomyopathies: Secondary | ICD-10-CM | POA: Diagnosis not present

## 2018-09-26 DIAGNOSIS — T827XXA Infection and inflammatory reaction due to other cardiac and vascular devices, implants and grafts, initial encounter: Secondary | ICD-10-CM | POA: Diagnosis not present

## 2018-09-26 DIAGNOSIS — Z6832 Body mass index (BMI) 32.0-32.9, adult: Secondary | ICD-10-CM | POA: Diagnosis not present

## 2018-09-26 DIAGNOSIS — I130 Hypertensive heart and chronic kidney disease with heart failure and stage 1 through stage 4 chronic kidney disease, or unspecified chronic kidney disease: Secondary | ICD-10-CM | POA: Diagnosis not present

## 2018-09-26 DIAGNOSIS — E1151 Type 2 diabetes mellitus with diabetic peripheral angiopathy without gangrene: Secondary | ICD-10-CM | POA: Diagnosis not present

## 2018-09-26 DIAGNOSIS — E669 Obesity, unspecified: Secondary | ICD-10-CM | POA: Diagnosis not present

## 2018-09-27 ENCOUNTER — Ambulatory Visit (INDEPENDENT_AMBULATORY_CARE_PROVIDER_SITE_OTHER): Payer: Medicare HMO

## 2018-09-27 DIAGNOSIS — Z95810 Presence of automatic (implantable) cardiac defibrillator: Secondary | ICD-10-CM

## 2018-09-27 DIAGNOSIS — I5042 Chronic combined systolic (congestive) and diastolic (congestive) heart failure: Secondary | ICD-10-CM

## 2018-09-27 NOTE — Progress Notes (Signed)
EPIC Encounter for ICM Monitoring  Patient Name: Eric Price is a 67 y.o. male Date: 09/27/2018 Primary Care Physican: [name], MD Primary Cardiologist:Berry Electrophysiologist:Croitoru Last Weight:210lbs(discharge weight) Today's Weight:unknown   Spoke with patient and wife.  Weight gain of about 5 pounds (215 lbs) in last week but has lost 2 of those pounds (213 lbs).  He continues to take Furosemide on sliding scale as noted in Dr [employer] last office visit note and confirmed with wife.  He denies any breathing difficulty  Thoracic impedance has progressively worsened since 09/13/2018   Prescribed: Prescribed by Dr Sallyanne Kuster 08/31/2018 per note but not reflected in med list. 1. FUROSEMIDE BASED ON YOUR WEIGHT If weight is 210 pounds or more take 80 mg twice daily If weight is 207-209 pounds take 80 mg once daily If weight is 206 pounds or less HOLD furosemide   2. TAKE one Potassium 20 mEq tablet ONLY on days you have to take Furosemide TWICE  Labs: 08/26/2018 Creatinine1.52, [name], Potassium3.5, Sodium133, [name] 08/25/2018 Creatinine1.67, BUN36, Potassium3.5, Sodium134, eGFR40-47  08/24/2018 Creatinine2.33, BUN51, Potassium3.6, Sodium132, eGFR27-31  11/19/2019Creatinine 2.84, BUN56, Potassium3.8, Sodium131, [name] 07/25/2018 Creatinine 1.6, BUN 25. Potassium 3.6, Sodium 140, Care Everywhere 07/08/2018 Creatinine 1.40, BUN 23, Potassium 3.4, Sodium 136, eGFR 50-58 06/15/2018 Creatinine 1.77, BUN 30, Potassium 4.3, Sodium 135, EGFR 49.45 04/18/2018 Creatinine 1.22, BUN 15, Potassium 4.4, Sodium 139, EGFR 76.01  Recommendations: Patient currently taking Furosemide 80 mg bid based on weight.  Will send copy to Almyra Deforest, PA so he can review at 09/29/2018 appt.  Advised to use ER if condition worsens.  Follow-up plan: ICM clinic phone appointment on1/2/2020to recheck fluid levels before office appointment scheduled  12/26/2019with Hao Meng,PA.   Copy of ICM check sent to Dr.Berry and Dr Sallyanne Kuster.   3 month ICM trend: 09/27/2018    1 Year ICM trend:       Rosalene Billings, RN 09/27/2018 10:46 AM

## 2018-09-29 ENCOUNTER — Ambulatory Visit (INDEPENDENT_AMBULATORY_CARE_PROVIDER_SITE_OTHER): Payer: Medicare HMO | Admitting: Physician Assistant

## 2018-09-29 ENCOUNTER — Encounter: Payer: Self-pay | Admitting: Physician Assistant

## 2018-09-29 VITALS — BP 122/68 | HR 116 | Ht 69.0 in | Wt 215.8 lb

## 2018-09-29 DIAGNOSIS — E785 Hyperlipidemia, unspecified: Secondary | ICD-10-CM

## 2018-09-29 DIAGNOSIS — N183 Chronic kidney disease, stage 3 unspecified: Secondary | ICD-10-CM

## 2018-09-29 DIAGNOSIS — E119 Type 2 diabetes mellitus without complications: Secondary | ICD-10-CM | POA: Diagnosis not present

## 2018-09-29 DIAGNOSIS — Z95810 Presence of automatic (implantable) cardiac defibrillator: Secondary | ICD-10-CM | POA: Diagnosis not present

## 2018-09-29 DIAGNOSIS — I10 Essential (primary) hypertension: Secondary | ICD-10-CM | POA: Diagnosis not present

## 2018-09-29 DIAGNOSIS — I2510 Atherosclerotic heart disease of native coronary artery without angina pectoris: Secondary | ICD-10-CM | POA: Diagnosis not present

## 2018-09-29 DIAGNOSIS — C8400 Mycosis fungoides, unspecified site: Secondary | ICD-10-CM

## 2018-09-29 DIAGNOSIS — I5022 Chronic systolic (congestive) heart failure: Secondary | ICD-10-CM | POA: Diagnosis not present

## 2018-09-29 DIAGNOSIS — Z89512 Acquired absence of left leg below knee: Secondary | ICD-10-CM | POA: Diagnosis not present

## 2018-09-29 LAB — BASIC METABOLIC PANEL
BUN / CREAT RATIO: 10 (ref 10–24)
BUN: 14 mg/dL (ref 8–27)
CO2: 23 mmol/L (ref 20–29)
CREATININE: 1.36 mg/dL — AB (ref 0.76–1.27)
Calcium: 9.5 mg/dL (ref 8.6–10.2)
Chloride: 100 mmol/L (ref 96–106)
GFR, EST AFRICAN AMERICAN: 61 mL/min/{1.73_m2} (ref >59)
GFR, EST NON AFRICAN AMERICAN: 53 mL/min/{1.73_m2} — AB (ref >59)
Glucose: 87 mg/dL (ref 65–99)
Potassium: 3.8 mmol/L (ref 3.5–5.2)
Sodium: 142 mmol/L (ref 134–144)
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
## 2018-09-29 NOTE — Patient Instructions (Signed)
Medication Instructions:  The current medical regimen is effective;  continue present plan and medications.  If you need a refill on your cardiac medications before your next appointment, please call your pharmacy.   Lab work: [employer] today If you have labs (blood work) drawn today and your tests are completely normal, you will receive your results only by: Marland Kitchen MyChart Message (if you have MyChart) OR . A paper copy in the mail If you have any lab test that is abnormal or we need to change your treatment, we will call you to review the results.  Follow-Up: At [facility], you and your health needs are our priority.  As part of our continuing mission to provide you with exceptional heart care, we have created designated Provider Care Teams.  These Care Teams include your primary Cardiologist (physician) and Advanced Practice Providers (APPs -  Physician Assistants and Nurse Practitioners) who all work together to provide you with the care you need, when you need it. You will need a follow up appointment in 3 months.  Please call our office 2 months in advance to schedule this appointment.  You may see Dr.Croitoru or one of the following Advanced Practice Providers on your designated Care Team: Almyra Deforest, [place] . Fabian Sharp, PA-C

## 2018-10-07 ENCOUNTER — Ambulatory Visit (INDEPENDENT_AMBULATORY_CARE_PROVIDER_SITE_OTHER): Payer: Medicare HMO

## 2018-10-07 DIAGNOSIS — I130 Hypertensive heart and chronic kidney disease with heart failure and stage 1 through stage 4 chronic kidney disease, or unspecified chronic kidney disease: Secondary | ICD-10-CM | POA: Diagnosis not present

## 2018-10-07 DIAGNOSIS — E669 Obesity, unspecified: Secondary | ICD-10-CM | POA: Diagnosis not present

## 2018-10-07 DIAGNOSIS — Z95810 Presence of automatic (implantable) cardiac defibrillator: Secondary | ICD-10-CM | POA: Diagnosis not present

## 2018-10-07 DIAGNOSIS — I5022 Chronic systolic (congestive) heart failure: Secondary | ICD-10-CM | POA: Diagnosis not present

## 2018-10-07 DIAGNOSIS — Z6832 Body mass index (BMI) 32.0-32.9, adult: Secondary | ICD-10-CM | POA: Diagnosis not present

## 2018-10-07 DIAGNOSIS — E1151 Type 2 diabetes mellitus with diabetic peripheral angiopathy without gangrene: Secondary | ICD-10-CM | POA: Diagnosis not present

## 2018-10-07 DIAGNOSIS — I428 Other cardiomyopathies: Secondary | ICD-10-CM | POA: Diagnosis not present

## 2018-10-07 DIAGNOSIS — I2510 Atherosclerotic heart disease of native coronary artery without angina pectoris: Secondary | ICD-10-CM | POA: Diagnosis not present

## 2018-10-07 DIAGNOSIS — T827XXA Infection and inflammatory reaction due to other cardiac and vascular devices, implants and grafts, initial encounter: Secondary | ICD-10-CM | POA: Diagnosis not present

## 2018-10-07 NOTE — Progress Notes (Signed)
Thank you MCr 

## 2018-10-07 NOTE — Progress Notes (Signed)
EPIC Encounter for ICM Monitoring  Patient Name: Eric Price is a 68 y.o. male Date: 10/07/2018 Primary Care Physican: [name], MD Primary Cardiologist:Berry Electrophysiologist:Croitoru Last Weight:210lbs(discharge weight 08/26/2018) Today's Weight: 213 lbs        Heart Failure questions reviewed, pt reports he has occasional shortness of breath but no worse than when he was at last office visit 09/29/2018.  Yesterday's weight was 215 lbs.   Thoracic impedance continues to be abnormal suggesting fluid accumulation (same as when he was in office 09/29/2018).         Prescribed: Prescribed by Dr Sallyanne Kuster 08/31/2018. 1. FUROSEMIDE BASED ON YOUR WEIGHT If weight is 210 pounds or more take 80 mg twice daily If weight is 207-209 pounds take 80 mg once daily If weight is 206 pounds or less HOLD furosemide   2. TAKE one Potassium 20 mEq tablet ONLY on days you have to take Furosemide TWICE  Labs: 09/29/2018 Creatinine 1.36, BUN 14, Potassium 3.8, Sodium 142, eGFR 53-61 08/26/2018 Creatinine1.52, BUN27, Potassium3.5, Sodium133, [name] 08/25/2018 Creatinine1.67, BUN36, Potassium3.5, Sodium134, IFOY77-41  08/24/2018 Creatinine2.33, BUN51, Potassium3.6, Sodium132, eGFR27-31  11/19/2019Creatinine 2.84, BUN56, Potassium3.8, Sodium131, OINO67-67 07/25/2018 Creatinine 1.6, BUN 25. Potassium 3.6, Sodium 140, Care Everywhere 07/08/2018 Creatinine 1.40, BUN 23, Potassium 3.4, Sodium 136, eGFR 50-58 06/15/2018 Creatinine 1.77, BUN 30, Potassium 4.3, Sodium 135, EGFR 49.45 04/18/2018 Creatinine 1.22, BUN 15, Potassium 4.4, Sodium 139, EGFR 76.01  Recommendations:  He confirms he continues sliding scale Furosemide and currently taking 80 mg bid.  Advised if taking Furosemide 80 mg bid does not hold his weight down to call the office.   Encouraged to call for fluid symptoms.  Follow-up plan: ICM clinic phone appointment on 11/07/2018.    Copy of ICM  check sent to Dr. Orene Desanctis and Dr Gwenlyn Found.   3 month ICM trend: 10/07/2018    1 Year ICM trend:       Rosalene Billings, RN 10/07/2018 12:09 PM

## 2018-10-09 DIAGNOSIS — R69 Illness, unspecified: Secondary | ICD-10-CM | POA: Diagnosis not present

## 2018-10-13 DIAGNOSIS — M792 Neuralgia and neuritis, unspecified: Secondary | ICD-10-CM | POA: Diagnosis not present

## 2018-10-13 DIAGNOSIS — R222 Localized swelling, mass and lump, trunk: Secondary | ICD-10-CM | POA: Diagnosis not present

## 2018-10-13 DIAGNOSIS — Z5111 Encounter for antineoplastic chemotherapy: Secondary | ICD-10-CM | POA: Diagnosis not present

## 2018-10-13 DIAGNOSIS — B370 Candidal stomatitis: Secondary | ICD-10-CM | POA: Diagnosis not present

## 2018-10-13 DIAGNOSIS — R300 Dysuria: Secondary | ICD-10-CM | POA: Diagnosis not present

## 2018-10-13 DIAGNOSIS — C8400 Mycosis fungoides, unspecified site: Secondary | ICD-10-CM | POA: Diagnosis not present

## 2018-10-13 DIAGNOSIS — N390 Urinary tract infection, site not specified: Secondary | ICD-10-CM | POA: Diagnosis not present

## 2018-10-13 DIAGNOSIS — R5383 Other fatigue: Secondary | ICD-10-CM | POA: Diagnosis not present

## 2018-10-13 DIAGNOSIS — N5089 Other specified disorders of the male genital organs: Secondary | ICD-10-CM | POA: Diagnosis not present

## 2018-10-14 DIAGNOSIS — Z5111 Encounter for antineoplastic chemotherapy: Secondary | ICD-10-CM | POA: Diagnosis not present

## 2018-10-14 DIAGNOSIS — C8400 Mycosis fungoides, unspecified site: Secondary | ICD-10-CM | POA: Diagnosis not present

## 2018-10-18 LAB — CUP PACEART REMOTE DEVICE CHECK
Battery Voltage: 3 V
Brady Statistic RV Percent Paced: 0 %
Date Time Interrogation Session: 20191118152403
HighPow Impedance: 74 Ohm
Implantable Lead Location: 753860
Lead Channel Impedance Value: 342 Ohm
Lead Channel Impedance Value: 399 Ohm
Lead Channel Pacing Threshold Pulse Width: 0.4 ms
Lead Channel Setting Pacing Amplitude: 2.5 V
Lead Channel Setting Sensing Sensitivity: 0.3 mV
MDC IDC LEAD IMPLANT DT: 20161122
MDC IDC MSMT BATTERY REMAINING LONGEVITY: 117 mo
MDC IDC MSMT LEADCHNL RV PACING THRESHOLD AMPLITUDE: 0.625 V
MDC IDC MSMT LEADCHNL RV SENSING INTR AMPL: 8.625 mV
MDC IDC MSMT LEADCHNL RV SENSING INTR AMPL: 8.625 mV
MDC IDC PG IMPLANT DT: 20161122
MDC IDC SET LEADCHNL RV PACING PULSEWIDTH: 0.4 ms

## 2018-10-19 ENCOUNTER — Telehealth: Payer: Self-pay | Admitting: *Deleted

## 2018-10-19 DIAGNOSIS — Z9189 Other specified personal risk factors, not elsewhere classified: Secondary | ICD-10-CM | POA: Diagnosis not present

## 2018-10-19 DIAGNOSIS — E669 Obesity, unspecified: Secondary | ICD-10-CM | POA: Diagnosis not present

## 2018-10-19 DIAGNOSIS — D72829 Elevated white blood cell count, unspecified: Secondary | ICD-10-CM | POA: Diagnosis not present

## 2018-10-19 DIAGNOSIS — I130 Hypertensive heart and chronic kidney disease with heart failure and stage 1 through stage 4 chronic kidney disease, or unspecified chronic kidney disease: Secondary | ICD-10-CM | POA: Diagnosis not present

## 2018-10-19 DIAGNOSIS — E1151 Type 2 diabetes mellitus with diabetic peripheral angiopathy without gangrene: Secondary | ICD-10-CM | POA: Diagnosis not present

## 2018-10-19 DIAGNOSIS — Z6832 Body mass index (BMI) 32.0-32.9, adult: Secondary | ICD-10-CM | POA: Diagnosis not present

## 2018-10-19 DIAGNOSIS — R000 Tachycardia, unspecified: Secondary | ICD-10-CM | POA: Diagnosis not present

## 2018-10-19 DIAGNOSIS — R234 Changes in skin texture: Secondary | ICD-10-CM | POA: Diagnosis not present

## 2018-10-19 DIAGNOSIS — A419 Sepsis, unspecified organism: Secondary | ICD-10-CM | POA: Diagnosis not present

## 2018-10-19 DIAGNOSIS — T827XXA Infection and inflammatory reaction due to other cardiac and vascular devices, implants and grafts, initial encounter: Secondary | ICD-10-CM | POA: Diagnosis not present

## 2018-10-19 DIAGNOSIS — C8400 Mycosis fungoides, unspecified site: Secondary | ICD-10-CM | POA: Diagnosis not present

## 2018-10-19 DIAGNOSIS — R0602 Shortness of breath: Secondary | ICD-10-CM | POA: Diagnosis not present

## 2018-10-19 DIAGNOSIS — I428 Other cardiomyopathies: Secondary | ICD-10-CM | POA: Diagnosis not present

## 2018-10-19 DIAGNOSIS — R590 Localized enlarged lymph nodes: Secondary | ICD-10-CM | POA: Diagnosis not present

## 2018-10-19 DIAGNOSIS — R9431 Abnormal electrocardiogram [ECG] [EKG]: Secondary | ICD-10-CM | POA: Diagnosis not present

## 2018-10-19 DIAGNOSIS — I2510 Atherosclerotic heart disease of native coronary artery without angina pectoris: Secondary | ICD-10-CM | POA: Diagnosis not present

## 2018-10-19 DIAGNOSIS — Z5181 Encounter for therapeutic drug level monitoring: Secondary | ICD-10-CM | POA: Diagnosis not present

## 2018-10-19 DIAGNOSIS — R161 Splenomegaly, not elsewhere classified: Secondary | ICD-10-CM | POA: Diagnosis not present

## 2018-10-19 DIAGNOSIS — R1084 Generalized abdominal pain: Secondary | ICD-10-CM | POA: Diagnosis not present

## 2018-10-19 NOTE — Telephone Encounter (Signed)
Glenard Haring, nurse with [facility] called stating she is concerned about the patient and is present now in the home.  Glenard Haring wanted to let Dr Elease Hashimoto know the patient appears gray in color, complains of increased dizziness, generalized weakness and is not well.  Stated he was recently diagnosed with a UTI and will finish Cipro on Thursday and she questioned if this could be side effects from Cipro.  Stated the pts glucose is 174, heart rate is 115, he has tachycardia but this is more than usual today,  She also stated patient complained of difficulty sleeping, wheezing which is worse at night x1 week, chest tightness, increased shortness of breath, having problems swallowing, choked on fluids and slid out of his chair.  Glenard Haring stated the pts lungs are clear, denies a fever, weight is stable at 214-211 lbs and no edema. Dr Elease Hashimoto was informed of this and advised the pt to go to the ER as he is concerned for a resistant bug or sepsis due to heart problems and other health problems also.  Glenard Haring stated she will inform the pt of this.

## 2018-10-23 ENCOUNTER — Other Ambulatory Visit: Payer: Self-pay | Admitting: Cardiovascular Disease

## 2018-10-24 ENCOUNTER — Other Ambulatory Visit: Payer: Self-pay | Admitting: Internal Medicine

## 2018-10-25 ENCOUNTER — Ambulatory Visit (INDEPENDENT_AMBULATORY_CARE_PROVIDER_SITE_OTHER): Payer: Medicare HMO | Admitting: Family Medicine

## 2018-10-25 ENCOUNTER — Other Ambulatory Visit: Payer: Self-pay

## 2018-10-25 ENCOUNTER — Encounter: Payer: Self-pay | Admitting: Family Medicine

## 2018-10-25 VITALS — BP 110/62 | HR 107 | Temp 99.4°F | Ht 69.0 in | Wt 214.1 lb

## 2018-10-25 DIAGNOSIS — R05 Cough: Secondary | ICD-10-CM | POA: Diagnosis not present

## 2018-10-25 DIAGNOSIS — L299 Pruritus, unspecified: Secondary | ICD-10-CM | POA: Diagnosis not present

## 2018-10-25 DIAGNOSIS — R5383 Other fatigue: Secondary | ICD-10-CM

## 2018-10-25 DIAGNOSIS — R059 Cough, unspecified: Secondary | ICD-10-CM
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
## 2018-10-25 NOTE — Patient Instructions (Signed)
Consider Aveeno soap for the itching  Avoid prolonged bathing  Consider Cetaphil or Eucerin moisturizer 50:50 with the steroid cream and apply once or twice daily after bathing.

## 2018-10-26 DIAGNOSIS — I2510 Atherosclerotic heart disease of native coronary artery without angina pectoris: Secondary | ICD-10-CM | POA: Diagnosis not present

## 2018-10-26 DIAGNOSIS — I428 Other cardiomyopathies: Secondary | ICD-10-CM | POA: Diagnosis not present

## 2018-10-26 DIAGNOSIS — E669 Obesity, unspecified: Secondary | ICD-10-CM | POA: Diagnosis not present

## 2018-10-26 DIAGNOSIS — Z6832 Body mass index (BMI) 32.0-32.9, adult: Secondary | ICD-10-CM | POA: Diagnosis not present

## 2018-10-26 DIAGNOSIS — T827XXA Infection and inflammatory reaction due to other cardiac and vascular devices, implants and grafts, initial encounter: Secondary | ICD-10-CM | POA: Diagnosis not present

## 2018-10-26 DIAGNOSIS — I130 Hypertensive heart and chronic kidney disease with heart failure and stage 1 through stage 4 chronic kidney disease, or unspecified chronic kidney disease: Secondary | ICD-10-CM | POA: Diagnosis not present

## 2018-10-26 DIAGNOSIS — E1151 Type 2 diabetes mellitus with diabetic peripheral angiopathy without gangrene: Secondary | ICD-10-CM | POA: Diagnosis not present

## 2018-10-27 ENCOUNTER — Telehealth: Payer: Self-pay | Admitting: Family Medicine

## 2018-10-27 NOTE — Telephone Encounter (Signed)
Copied from [place] [id number]. Topic: Quick Communication - Home Health Verbal Orders >> Oct 27, 2018 11:08 AM Scherrie Gerlach wrote: Caller/Agency: [facility]//  Dianna Rossetti Number: [phone] Would like verbal to re certify pt to continue care for disease teaching and pic maintenance. 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

## 2018-10-31 LAB — CUP PACEART INCLINIC DEVICE CHECK
Implantable Lead Implant Date: 20161122
Implantable Lead Location: 753860
Implantable Pulse Generator Implant Date: 20161122
MDC IDC SESS DTM: 20200127145056

## 2018-10-31 NOTE — Telephone Encounter (Signed)
Please advise 

## 2018-10-31 NOTE — Telephone Encounter (Signed)
OK 

## 2018-11-01 DIAGNOSIS — R69 Illness, unspecified: Secondary | ICD-10-CM | POA: Diagnosis not present

## 2018-11-01 NOTE — Telephone Encounter (Signed)
Ok to set up 

## 2018-11-01 NOTE — Telephone Encounter (Signed)
Called patient and LMOVM.  Ok for [facility] to Discuss results / PCP / recommendations / Schedule patient  Per Dr. Elease Hashimoto: OK to set up requested skilled nursing continued care.

## 2018-11-01 NOTE — Telephone Encounter (Signed)
Please advise 

## 2018-11-01 NOTE — Telephone Encounter (Signed)
Copied from [place] [id number]. Topic: Quick Communication - Home Health Verbal Orders >> Nov 01, 2018 10:28 AM Alanda Slim E wrote: Caller/Agency: [facility] Number: [phone] [facility] for continued care  Frequency: re-certification

## 2018-11-02 ENCOUNTER — Other Ambulatory Visit: Payer: Self-pay

## 2018-11-02 ENCOUNTER — Encounter: Payer: Self-pay | Admitting: Family Medicine

## 2018-11-02 ENCOUNTER — Ambulatory Visit (INDEPENDENT_AMBULATORY_CARE_PROVIDER_SITE_OTHER): Payer: Medicare HMO | Admitting: Family Medicine

## 2018-11-02 VITALS — BP 102/64 | HR 104 | Temp 98.7°F | Ht 69.0 in | Wt 211.7 lb

## 2018-11-02 DIAGNOSIS — E1142 Type 2 diabetes mellitus with diabetic polyneuropathy: Secondary | ICD-10-CM

## 2018-11-02 DIAGNOSIS — R1084 Generalized abdominal pain: Secondary | ICD-10-CM

## 2018-11-02 DIAGNOSIS — Z794 Long term (current) use of insulin: Secondary | ICD-10-CM

## 2018-11-02 DIAGNOSIS — K5900 Constipation, unspecified: Secondary | ICD-10-CM | POA: Diagnosis not present

## 2018-11-02 NOTE — Patient Instructions (Signed)
Constipation, Adult Constipation is when a person has fewer bowel movements in a week than normal, has difficulty having a bowel movement, or has stools that are dry, hard, or larger than normal. Constipation may be caused by an underlying condition. It may become worse with age if a person takes certain medicines and does not take in enough fluids. Follow these instructions at home: Eating and drinking   Eat foods that have a lot of fiber, such as fresh fruits and vegetables, whole grains, and beans.  Limit foods that are high in fat, low in fiber, or overly processed, such as french fries, hamburgers, cookies, candies, and soda.  Drink enough fluid to keep your urine clear or pale yellow. General instructions  Exercise regularly or as told by your health care provider.  Go to the restroom when you have the urge to go. Do not hold it in.  Take over-the-counter and prescription medicines only as told by your health care provider. These include any fiber supplements.  Practice pelvic floor retraining exercises, such as deep breathing while relaxing the lower abdomen and pelvic floor relaxation during bowel movements.  Watch your condition for any changes.  Keep all follow-up visits as told by your health care provider. This is important. Contact a health care provider if:  You have pain that gets worse.  You have a fever.  You do not have a bowel movement after 4 days.  You vomit.  You are not hungry.  You lose weight.  You are bleeding from the anus.  You have thin, pencil-like stools. Get help right away if:  You have a fever and your symptoms suddenly get worse.  You leak stool or have blood in your stool.  Your abdomen is bloated.  You have severe pain in your abdomen.  You feel dizzy or you faint. This information is not intended to replace advice given to you by your health care provider. Make sure you discuss any questions you have with your health care  provider. Document Released: 06/19/2004 Document Revised: 04/10/2016 Document Reviewed: 03/11/2016 Elsevier Interactive Patient Education  2019 Elsevier Inc.  Increase fluid intake Increase dietary fiber Consider Miralax once or twice daily If no relief with the above, consider senokot.   Could also try glycerin suppository.
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
## 2018-11-03 ENCOUNTER — Encounter: Payer: Self-pay | Admitting: Internal Medicine

## 2018-11-03 ENCOUNTER — Ambulatory Visit (INDEPENDENT_AMBULATORY_CARE_PROVIDER_SITE_OTHER): Payer: Medicare HMO | Admitting: Internal Medicine

## 2018-11-03 VITALS — BP 130/80 | HR 102 | Ht 69.0 in | Wt 215.0 lb

## 2018-11-03 DIAGNOSIS — E1159 Type 2 diabetes mellitus with other circulatory complications: Secondary | ICD-10-CM

## 2018-11-03 DIAGNOSIS — E1165 Type 2 diabetes mellitus with hyperglycemia: Secondary | ICD-10-CM

## 2018-11-03 DIAGNOSIS — E782 Mixed hyperlipidemia: Secondary | ICD-10-CM

## 2018-11-03 DIAGNOSIS — E669 Obesity, unspecified: Secondary | ICD-10-CM | POA: Diagnosis not present

## 2018-11-03 DIAGNOSIS — R634 Abnormal weight loss: Secondary | ICD-10-CM

## 2018-11-03 DIAGNOSIS — I2510 Atherosclerotic heart disease of native coronary artery without angina pectoris: Secondary | ICD-10-CM | POA: Diagnosis not present

## 2018-11-03 DIAGNOSIS — I130 Hypertensive heart and chronic kidney disease with heart failure and stage 1 through stage 4 chronic kidney disease, or unspecified chronic kidney disease: Secondary | ICD-10-CM | POA: Diagnosis not present

## 2018-11-03 DIAGNOSIS — I428 Other cardiomyopathies: Secondary | ICD-10-CM | POA: Diagnosis not present

## 2018-11-03 DIAGNOSIS — E1151 Type 2 diabetes mellitus with diabetic peripheral angiopathy without gangrene: Secondary | ICD-10-CM | POA: Diagnosis not present

## 2018-11-03 DIAGNOSIS — Z6832 Body mass index (BMI) 32.0-32.9, adult: Secondary | ICD-10-CM | POA: Diagnosis not present

## 2018-11-03 DIAGNOSIS — T827XXA Infection and inflammatory reaction due to other cardiac and vascular devices, implants and grafts, initial encounter: Secondary | ICD-10-CM | POA: Diagnosis not present

## 2018-11-03 LAB — POCT GLYCOSYLATED HEMOGLOBIN (HGB A1C): Hemoglobin A1C: 5.9 % — AB (ref 4.0–5.6)

## 2018-11-03 MED ORDER — INSULIN DETEMIR 100 UNIT/ML FLEXPEN: 30.0000 [IU] | PEN_INJECTOR | Freq: Two times a day (BID) | SUBCUTANEOUS | 3 refills | Status: DC

## 2018-11-03 MED ORDER — INSULIN ASPART 100 UNIT/ML FLEXPEN: PEN_INJECTOR | SUBCUTANEOUS | 3 refills | Status: DC

## 2018-11-03 MED ORDER — DULAGLUTIDE 1.5 MG/0.5ML ~~LOC~~ SOAJ: 1.5000 mg | SUBCUTANEOUS | 3 refills | Status: DC
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
## 2018-11-03 NOTE — Patient Instructions (Addendum)
Please continue: - Trulicity 1.5 mg weekly  Please decrease: - Novolog 20-25 units before meals - Levemir 30 units 2x a day  Please return in 3 months with your sugar log.

## 2018-11-04 ENCOUNTER — Encounter: Payer: Self-pay | Admitting: Podiatry

## 2018-11-04 ENCOUNTER — Ambulatory Visit: Payer: Self-pay | Admitting: Podiatry

## 2018-11-04 DIAGNOSIS — E119 Type 2 diabetes mellitus without complications: Secondary | ICD-10-CM

## 2018-11-04 DIAGNOSIS — I10 Essential (primary) hypertension: Secondary | ICD-10-CM

## 2018-11-04 DIAGNOSIS — E669 Obesity, unspecified: Secondary | ICD-10-CM

## 2018-11-04 DIAGNOSIS — M79674 Pain in right toe(s): Secondary | ICD-10-CM

## 2018-11-04 DIAGNOSIS — E1142 Type 2 diabetes mellitus with diabetic polyneuropathy: Secondary | ICD-10-CM

## 2018-11-04 DIAGNOSIS — E1169 Type 2 diabetes mellitus with other specified complication: Secondary | ICD-10-CM

## 2018-11-04 DIAGNOSIS — B351 Tinea unguium: Secondary | ICD-10-CM

## 2018-11-04 DIAGNOSIS — E1159 Type 2 diabetes mellitus with other circulatory complications: Secondary | ICD-10-CM

## 2018-11-04 NOTE — Progress Notes (Signed)
Complaint:  Visit Type: Patient returns to my office for continued preventative foot care services. Complaint: Patient states" my nails have grown long and thick and become painful to walk and wear shoes" Patient has been diagnosed with DM with neuropathy right foot.  Ampuation  BK left leg.. The patient presents for preventative foot care services. No changes to ROS  Podiatric Exam: Vascular: dorsalis pedis and posterior tibial pulses are palpable right.  . Capillary return is immediate. Temperature gradient is WNL. Skin turgor WNL  Sensorium: Diminished Semmes Weinstein monofilament test. Diminished  tactile sensation bilaterally. Nail Exam: Pt has thick disfigured discolored nails with subungual debris noted  entire nail hallux through fifth toenails right foot. Ulcer Exam: There is no evidence of ulcer or pre-ulcerative changes or infection. Orthopedic Exam: Muscle tone and strength are WNL. No limitations in general ROM. No crepitus or effusions noted. Foot type and digits show no abnormalities. Bony prominences are unremarkable. Skin: No Porokeratosis. No infection or ulcers  Diagnosis:  Onychomycosis, , Pain in right toe, pain in left toes  Treatment & Plan Procedures and Treatment: Consent by patient was obtained for treatment procedures.   Debridement of mycotic and hypertrophic toenails, 1 through 5 bilateral and clearing of subungual debris. No ulceration, no infection noted.  Return Visit-Office Procedure: Patient instructed to return to the office for a follow up visit 3 months for continued evaluation and treatment.    Gardiner Barefoot DPM

## 2018-11-07 ENCOUNTER — Ambulatory Visit (INDEPENDENT_AMBULATORY_CARE_PROVIDER_SITE_OTHER): Payer: Medicare HMO

## 2018-11-07 ENCOUNTER — Telehealth: Payer: Self-pay

## 2018-11-07 DIAGNOSIS — R234 Changes in skin texture: Secondary | ICD-10-CM | POA: Diagnosis not present

## 2018-11-07 DIAGNOSIS — I5022 Chronic systolic (congestive) heart failure: Secondary | ICD-10-CM | POA: Diagnosis not present

## 2018-11-07 DIAGNOSIS — C8400 Mycosis fungoides, unspecified site: Secondary | ICD-10-CM | POA: Diagnosis not present

## 2018-11-07 DIAGNOSIS — Z95810 Presence of automatic (implantable) cardiac defibrillator: Secondary | ICD-10-CM | POA: Diagnosis not present

## 2018-11-07 DIAGNOSIS — Z79899 Other long term (current) drug therapy: Secondary | ICD-10-CM | POA: Diagnosis not present

## 2018-11-07 DIAGNOSIS — R590 Localized enlarged lymph nodes: Secondary | ICD-10-CM | POA: Diagnosis not present

## 2018-11-07 NOTE — Telephone Encounter (Signed)
Remote ICM transmission received.  Attempted call to patient regarding ICM remote transmission and left message, per DPR, to return call.    

## 2018-11-07 NOTE — Progress Notes (Signed)
EPIC Encounter for ICM Monitoring  Patient Name: Eric Price is a 68 y.o. male Date: 11/07/2018 Primary Care Physican: [name], MD Primary Cardiologist:Berry Electrophysiologist:Croitoru Last Weight:215lbs Today's Weight: 206 lbs   Since 07-Oct-2018 VT-NS (>4 beats, >200 bpm) = 4 episodes                                                           Attempted call to patient and unable to reach.  Left message to return call. Transmission reviewed.    Thoracic impedance continues to be abnormal suggesting fluid accumulation since 08/26/2018 (discharge date from hospital) with exception of 1 day at baseline.         Prescribed: Prescribed by Dr Sallyanne Kuster 08/31/2018. 1. FUROSEMIDE BASED ON YOUR WEIGHT If weight is 210 pounds or more take 80 mg twice daily If weight is 207-209 pounds take 80 mg once daily If weight is 206 pounds or less HOLD furosemide 2. TAKE one Potassium 20 mEq tablet ONLY on days you have to take Furosemide TWICE  Labs: 09/29/2018 Creatinine 1.36, BUN 14, Potassium 3.8, Sodium 142, eGFR 53-61 08/26/2018 Creatinine1.52, BUN27, Potassium3.5, Sodium133, [id number] 08/25/2018 Creatinine1.67, BUN36, Potassium3.5, Sodium134, [id number]  08/24/2018 Creatinine2.33, BUN51, Potassium3.6, Sodium132, eGFR27-31  11/19/2019Creatinine 2.84, BUN56, Potassium3.8, Sodium131, BWIO03-55 07/25/2018 Creatinine 1.6, BUN 25. Potassium 3.6, Sodium 140, Care Everywhere 07/08/2018 Creatinine 1.40, BUN 23, Potassium 3.4, Sodium 136, eGFR 50-58 06/15/2018 Creatinine 1.77, BUN 30, Potassium 4.3, Sodium 135, EGFR 49.45 04/18/2018 Creatinine 1.22, BUN 15, Potassium 4.4, Sodium 139, EGFR 76.01  Recommendations:  Unable to reach.  Follow-up plan: ICM clinic phone appointment on 11/15/2018 to recheck fluid levels.    Copy of ICM check sent to Dr. Orene Desanctis and Dr Gwenlyn Found.   3 month ICM trend: 11/07/2018    1 Year ICM trend:       Rosalene Billings, RN 11/07/2018 2:41 PM

## 2018-11-08 NOTE — Progress Notes (Signed)
I'm curious how he feels. May need to liberalize his prescription just a bit. He does have a very tight range between wet and dry. MCr

## 2018-11-09 ENCOUNTER — Telehealth: Payer: Self-pay

## 2018-11-09 DIAGNOSIS — T827XXA Infection and inflammatory reaction due to other cardiac and vascular devices, implants and grafts, initial encounter: Secondary | ICD-10-CM | POA: Diagnosis not present

## 2018-11-09 DIAGNOSIS — D631 Anemia in chronic kidney disease: Secondary | ICD-10-CM | POA: Diagnosis not present

## 2018-11-09 DIAGNOSIS — I130 Hypertensive heart and chronic kidney disease with heart failure and stage 1 through stage 4 chronic kidney disease, or unspecified chronic kidney disease: Secondary | ICD-10-CM | POA: Diagnosis not present

## 2018-11-09 DIAGNOSIS — N179 Acute kidney failure, unspecified: Secondary | ICD-10-CM | POA: Diagnosis not present

## 2018-11-09 DIAGNOSIS — I5042 Chronic combined systolic (congestive) and diastolic (congestive) heart failure: Secondary | ICD-10-CM | POA: Diagnosis not present

## 2018-11-09 DIAGNOSIS — I2510 Atherosclerotic heart disease of native coronary artery without angina pectoris: Secondary | ICD-10-CM | POA: Diagnosis not present

## 2018-11-09 DIAGNOSIS — Z452 Encounter for adjustment and management of vascular access device: Secondary | ICD-10-CM | POA: Diagnosis not present

## 2018-11-09 DIAGNOSIS — N183 Chronic kidney disease, stage 3 (moderate): Secondary | ICD-10-CM | POA: Diagnosis not present

## 2018-11-09 DIAGNOSIS — I428 Other cardiomyopathies: Secondary | ICD-10-CM | POA: Diagnosis not present

## 2018-11-09 DIAGNOSIS — C8400 Mycosis fungoides, unspecified site: Secondary | ICD-10-CM | POA: Diagnosis not present

## 2018-11-09 NOTE — Telephone Encounter (Signed)
Attempted patient call to provide Dr Croitoru's recommendation in response to ICM follow up, see 11/07/2018 ICM note regarding symptoms and remote transmission.   Patient currently following Furosemide sliding scale prescribed by Dr Sallyanne Kuster. Prescribed by Dr Sallyanne Kuster 08/31/2018. 1. FUROSEMIDE BASED ON YOUR WEIGHT If weight is 210 pounds or more take 80 mg twice daily If weight is 207-209 pounds take 80 mg once daily If weight is 206 pounds or less HOLD furosemide 2. TAKE one Potassium 20 mEq tablet ONLY on days you have to take Furosemide TWICE   11/09/2018 Dr Croitoru's recommendation is drop that lower HOLD limit to 204 lb. Take furosemide today and all days if weight is over 204 lb, other instructions unchanged. BMET in 2-3 weeks - OK to do w PCP or Nephrologist if he has an upcoming appt (see ICM notes regarding recommendations).

## 2018-11-09 NOTE — Progress Notes (Signed)
How about we drop that lower HOLD limit to 204 lb. Take furosemide today and all days if weight is over 204 lb, other instructions unchanged. BMET in 2-3 weeks - OK to do w PCP or Nephrologist if he has an upcoming appt.

## 2018-11-09 NOTE — Progress Notes (Signed)
Attempted patient call to provide recommendations from Dr Sallyanne Kuster and left message to return call.  BMET Order placed and patient will need to have drawn between 11/23/2018 and 11/30/2018

## 2018-11-09 NOTE — Telephone Encounter (Signed)
Patient returned call.  Advised Dr Sallyanne Kuster wants to make a change in the sliding scale.  Advised if weight is 204 lb or less to hold the Furosemide instead of 206 lbs. Explained Dr Sallyanne Kuster would like for him to take Furosemide 80 mg today and continue if weight is 205 - 209 lbs.   Also advised between 2/19 and 2/26 he will need to get BMET drawn at Dr Croitoru's office and the order has already been placed.  He verbalized understanding.    New Sliding scale is  1. FUROSEMIDE BASED ON YOUR WEIGHT If weight is 210 pounds or more take 80 mg twice daily If weight is 205-209 pounds take 80 mg once daily If weight is 204 pounds or less HOLD furosemide

## 2018-11-09 NOTE — Progress Notes (Signed)
Patient returned call.  Advised Dr Sallyanne Kuster wants to make a change in the sliding scale.  Advised if weight is 204 lb or less to hold the Furosemide instead of 206 lbs. If weight is 205-209 lbs then he will take Furosemide 80 mg daily.  Explained Dr Sallyanne Kuster would like for him to take Furosemide 80 mg today and continue if weight is between 205 - 209 lbs.   Also advised between 2/19 and 2/26 he will need to get BMET drawn at Dr Croitoru's office and the order has already been placed.  He verbalized understanding.    New Sliding scale is  1. FUROSEMIDE BASED ON YOUR WEIGHT If weight is 210 pounds or more take 80 mg twice daily If weight is 205-209 pounds take 80 mg once daily If weight is 204 pounds or less HOLD furosemide

## 2018-11-09 NOTE — Progress Notes (Signed)
Spoke with patient.    SYMPTOMS: He reports short of breath that comes and goes daily. He said the shortness of breath can be at rest or when moving.  He is unable to distinguish if his breathing is worse since the Furosemide dosage has been on hold.  PRESCRIBED: Furosemide is sliding scale by weight.  Current weight is 206 lbs and Furosemide dosage has been on hold for at least 1 week.      RECOMMENDATIONS: Advised will update Dr Sallyanne Kuster on how he is feeling and if any recommendations will call back.    PLAN: Next remote transmission to recheck fluid levels 11/15/2018.

## 2018-11-15 ENCOUNTER — Ambulatory Visit (INDEPENDENT_AMBULATORY_CARE_PROVIDER_SITE_OTHER): Payer: Medicare HMO

## 2018-11-15 DIAGNOSIS — I5022 Chronic systolic (congestive) heart failure: Secondary | ICD-10-CM

## 2018-11-15 DIAGNOSIS — Z95810 Presence of automatic (implantable) cardiac defibrillator: Secondary | ICD-10-CM

## 2018-11-15 NOTE — Progress Notes (Signed)
Thanks. Hopefully this will work at least for a while [employer]

## 2018-11-15 NOTE — Progress Notes (Signed)
EPIC Encounter for ICM Monitoring  Patient Name: Eric Price is a 68 y.o. male Date: 11/15/2018 Primary Care Physican: [name], MD Primary Cardiologist:Berry Electrophysiologist:Croitoru Last Weight:206lbs Today's Weight:200 lbs        Heart failure questions reviewed and he stated he is feeling fine.  He lost weight and down to 200 lbs. Reviewed the Furosemide sliding scale and reminded him if less than 204 lbs he needs to hold Furosemide.  Thoracic impedancereturned to normalafter Furosemide sliding scale adjusted 11/07/2018.  Prescribed: Sliding scale adjusted by Dr Sallyanne Kuster 11/07/2018. 1.FUROSEMIDE BASED ON YOUR WEIGHT If weight is 210 pounds or more take 80 mg twice daily If weight is 205-209 pounds take 80 mg once daily If weight is 204pounds or less HOLD furosemide  2. TAKE one Potassium 20 mEq tablet ONLY on days you have to take Furosemide TWICE  Labs: 09/29/2018 Creatinine 1.36, BUN 14, Potassium 3.8, Sodium 142, eGFR 53-61 08/26/2018 Creatinine1.52, BUN27, Potassium3.5, Sodium133, [id number] 08/25/2018 Creatinine1.67, BUN36, Potassium3.5, Sodium134, SUNH91-44  08/24/2018 Creatinine2.33, BUN51, Potassium3.6, Sodium132, eGFR27-31  11/19/2019Creatinine 2.84, BUN56, Potassium3.8, Sodium131, [id number] 07/25/2018 Creatinine 1.6, BUN 25. Potassium 3.6, Sodium 140, Care Everywhere 07/08/2018 Creatinine 1.40, BUN 23, Potassium 3.4, Sodium 136, eGFR 50-58 06/15/2018 Creatinine 1.77, BUN 30, Potassium 4.3, Sodium 135, EGFR 49.45 04/18/2018 Creatinine 1.22, BUN 15, Potassium 4.4, Sodium 139, EGFR 76.01  Recommendations:Reminded him that BMET needs to be drawn at Dr Croitoru's office between 2/19 and 2/26 (ordered 11/07/2018).  Encouraged to call for fluid symptoms.   Follow-up plan: ICM clinic phone appointment on3/06/2019.  Copy of ICM check sent to Dr.Croituru.  3 month ICM trend: 11/15/2018    1 Year ICM  trend:       Rosalene Billings, RN 11/15/2018 12:32 PM

## 2018-11-16 DIAGNOSIS — Z6832 Body mass index (BMI) 32.0-32.9, adult: Secondary | ICD-10-CM | POA: Diagnosis not present

## 2018-11-16 DIAGNOSIS — E1151 Type 2 diabetes mellitus with diabetic peripheral angiopathy without gangrene: Secondary | ICD-10-CM | POA: Diagnosis not present

## 2018-11-16 DIAGNOSIS — I2510 Atherosclerotic heart disease of native coronary artery without angina pectoris: Secondary | ICD-10-CM | POA: Diagnosis not present

## 2018-11-16 DIAGNOSIS — E669 Obesity, unspecified: Secondary | ICD-10-CM | POA: Diagnosis not present

## 2018-11-16 DIAGNOSIS — I428 Other cardiomyopathies: Secondary | ICD-10-CM | POA: Diagnosis not present

## 2018-11-16 DIAGNOSIS — I130 Hypertensive heart and chronic kidney disease with heart failure and stage 1 through stage 4 chronic kidney disease, or unspecified chronic kidney disease: Secondary | ICD-10-CM | POA: Diagnosis not present

## 2018-11-16 DIAGNOSIS — T827XXA Infection and inflammatory reaction due to other cardiac and vascular devices, implants and grafts, initial encounter: Secondary | ICD-10-CM | POA: Diagnosis not present

## 2018-11-17 ENCOUNTER — Other Ambulatory Visit: Payer: Self-pay

## 2018-11-17 ENCOUNTER — Emergency Department (HOSPITAL_COMMUNITY)
Admission: EM | Admit: 2018-11-17 | Discharge: 2018-11-17 | Disposition: A | Discharge status: Home / Self Care | Payer: Medicare HMO | Attending: Emergency Medicine | Admitting: Emergency Medicine

## 2018-11-17 ENCOUNTER — Emergency Department (HOSPITAL_COMMUNITY): Payer: Medicare HMO

## 2018-11-17 ENCOUNTER — Encounter (HOSPITAL_COMMUNITY): Payer: Self-pay | Admitting: Emergency Medicine

## 2018-11-17 DIAGNOSIS — E1122 Type 2 diabetes mellitus with diabetic chronic kidney disease: Secondary | ICD-10-CM | POA: Insufficient documentation

## 2018-11-17 DIAGNOSIS — I2510 Atherosclerotic heart disease of native coronary artery without angina pectoris: Secondary | ICD-10-CM | POA: Diagnosis not present

## 2018-11-17 DIAGNOSIS — I130 Hypertensive heart and chronic kidney disease with heart failure and stage 1 through stage 4 chronic kidney disease, or unspecified chronic kidney disease: Secondary | ICD-10-CM | POA: Diagnosis not present

## 2018-11-17 DIAGNOSIS — R0600 Dyspnea, unspecified: Secondary | ICD-10-CM | POA: Diagnosis not present

## 2018-11-17 DIAGNOSIS — N183 Chronic kidney disease, stage 3 (moderate): Secondary | ICD-10-CM | POA: Insufficient documentation

## 2018-11-17 DIAGNOSIS — I5042 Chronic combined systolic (congestive) and diastolic (congestive) heart failure: Secondary | ICD-10-CM | POA: Diagnosis not present

## 2018-11-17 DIAGNOSIS — R42 Dizziness and giddiness: Secondary | ICD-10-CM | POA: Diagnosis not present

## 2018-11-17 DIAGNOSIS — R0902 Hypoxemia: Secondary | ICD-10-CM | POA: Diagnosis not present

## 2018-11-17 DIAGNOSIS — Z79899 Other long term (current) drug therapy: Secondary | ICD-10-CM | POA: Diagnosis not present

## 2018-11-17 DIAGNOSIS — Z794 Long term (current) use of insulin: Secondary | ICD-10-CM | POA: Insufficient documentation

## 2018-11-17 DIAGNOSIS — R55 Syncope and collapse: Secondary | ICD-10-CM | POA: Diagnosis not present

## 2018-11-17 LAB — CBC WITH DIFFERENTIAL/PLATELET
Abs Immature Granulocytes: 0.15 10*3/uL — ABNORMAL HIGH (ref 0.00–0.07)
Basophils Absolute: 0.1 10*3/uL (ref 0.0–0.1)
Basophils Relative: 1 %
EOS ABS: 0.6 10*3/uL — AB (ref 0.0–0.5)
Eosinophils Relative: 9 %
HCT: 35.2 % — ABNORMAL LOW (ref 39.0–52.0)
Hemoglobin: 11.4 g/dL — ABNORMAL LOW (ref 13.0–17.0)
Immature Granulocytes: 2 %
Lymphocytes Relative: 18 %
Lymphs Abs: 1.2 10*3/uL (ref 0.7–4.0)
MCH: 27.3 pg (ref 26.0–34.0)
MCHC: 32.4 g/dL (ref 30.0–36.0)
MCV: 84.4 fL (ref 80.0–100.0)
Monocytes Absolute: 1.2 10*3/uL — ABNORMAL HIGH (ref 0.1–1.0)
Monocytes Relative: 16 %
NEUTROS PCT: 54 %
Neutro Abs: 3.9 10*3/uL (ref 1.7–7.7)
Platelets: 290 10*3/uL (ref 150–400)
RBC: 4.17 MIL/uL — ABNORMAL LOW (ref 4.22–5.81)
RDW: 16.6 % — ABNORMAL HIGH (ref 11.5–15.5)
WBC: 7.1 10*3/uL (ref 4.0–10.5)
nRBC: 0 % (ref 0.0–0.2)

## 2018-11-17 LAB — BASIC METABOLIC PANEL
ANION GAP: 10 (ref 5–15)
BUN: 19 mg/dL (ref 8–23)
CALCIUM: 9.2 mg/dL (ref 8.9–10.3)
CO2: 24 mmol/L (ref 22–32)
Chloride: 99 mmol/L (ref 98–111)
Creatinine, Ser: 1.4 mg/dL — ABNORMAL HIGH (ref 0.61–1.24)
GFR calc Af Amer: 59 mL/min — ABNORMAL LOW (ref >60)
GFR calc non Af Amer: 51 mL/min — ABNORMAL LOW (ref >60)
GLUCOSE: 132 mg/dL — AB (ref 70–99)
Potassium: 4.4 mmol/L (ref 3.5–5.1)
Sodium: 133 mmol/L — ABNORMAL LOW (ref 135–145)

## 2018-11-17 LAB — TROPONIN I: Troponin I: <0.03 ng/mL (ref <0.03)

## 2018-11-17 LAB — CBG MONITORING, ED: Glucose-Capillary: 130 mg/dL — ABNORMAL HIGH (ref 70–99)

## 2018-11-17 LAB — BRAIN NATRIURETIC PEPTIDE: B Natriuretic Peptide: 17.7 pg/mL (ref 0.0–100.0)

## 2018-11-17 MED ORDER — SODIUM CHLORIDE 0.9 % IV BOLUS
500.0000 mL | Freq: Once | INTRAVENOUS | Status: AC
  Administered 2018-11-17: 500 mL via INTRAVENOUS

## 2018-11-17 NOTE — ED Notes (Signed)
Bed: WA01 Expected date:  Expected time:  Means of arrival:  Comments: EMS 67yo near syncope, hx of blood ca

## 2018-11-17 NOTE — ED Triage Notes (Signed)
Per EMS: Pt from home. Pt hx of blood CA, is seen at [facility].  Pt c/o of feeling weak and nauseated.  Pt did not loose consciousness, or fall.  Pt received 250 mL of NS and 4 mg zofran.
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
## 2018-11-17 NOTE — ED Notes (Signed)
Pt denied any symptoms of syncope while standing at bedside.

## 2018-11-21 ENCOUNTER — Ambulatory Visit (INDEPENDENT_AMBULATORY_CARE_PROVIDER_SITE_OTHER): Payer: Medicare HMO

## 2018-11-21 DIAGNOSIS — I469 Cardiac arrest, cause unspecified: Secondary | ICD-10-CM

## 2018-11-21 DIAGNOSIS — C8408 Mycosis fungoides, lymph nodes of multiple sites: Secondary | ICD-10-CM | POA: Diagnosis not present

## 2018-11-21 DIAGNOSIS — C8400 Mycosis fungoides, unspecified site: Secondary | ICD-10-CM | POA: Diagnosis not present

## 2018-11-21 LAB — CUP PACEART REMOTE DEVICE CHECK
Battery Remaining Longevity: 114 mo
Battery Voltage: 3.01 V
Brady Statistic RV Percent Paced: 0 %
Date Time Interrogation Session: 20200217201942
HIGH POWER IMPEDANCE MEASURED VALUE: 70 Ohm
Implantable Lead Implant Date: 20161122
Implantable Pulse Generator Implant Date: 20161122
Lead Channel Impedance Value: 323 Ohm
Lead Channel Impedance Value: 380 Ohm
Lead Channel Pacing Threshold Amplitude: 0.625 V
Lead Channel Pacing Threshold Pulse Width: 0.4 ms
Lead Channel Sensing Intrinsic Amplitude: 9.375 mV
Lead Channel Sensing Intrinsic Amplitude: 9.375 mV
Lead Channel Setting Pacing Amplitude: 2.5 V
Lead Channel Setting Pacing Pulse Width: 0.4 ms
Lead Channel Setting Sensing Sensitivity: 0.3 mV
MDC IDC LEAD LOCATION: 753860

## 2018-11-23 ENCOUNTER — Emergency Department (HOSPITAL_COMMUNITY): Payer: Medicare HMO

## 2018-11-23 ENCOUNTER — Other Ambulatory Visit: Payer: Self-pay | Admitting: Cardiovascular Disease

## 2018-11-23 ENCOUNTER — Other Ambulatory Visit: Payer: Self-pay

## 2018-11-23 ENCOUNTER — Encounter (HOSPITAL_COMMUNITY): Payer: Self-pay

## 2018-11-23 ENCOUNTER — Inpatient Hospital Stay (HOSPITAL_COMMUNITY)
Admission: EM | Admit: 2018-11-23 | Discharge: 2018-11-27 | DRG: 193 | Disposition: A | Discharge status: Home Health | Payer: Medicare HMO | Attending: Internal Medicine | Admitting: Internal Medicine

## 2018-11-23 ENCOUNTER — Telehealth: Payer: Self-pay | Admitting: Cardiovascular Disease

## 2018-11-23 DIAGNOSIS — I428 Other cardiomyopathies: Secondary | ICD-10-CM | POA: Diagnosis not present

## 2018-11-23 DIAGNOSIS — J301 Allergic rhinitis due to pollen: Secondary | ICD-10-CM | POA: Diagnosis present

## 2018-11-23 DIAGNOSIS — R69 Illness, unspecified: Secondary | ICD-10-CM | POA: Diagnosis not present

## 2018-11-23 DIAGNOSIS — K0889 Other specified disorders of teeth and supporting structures: Secondary | ICD-10-CM | POA: Diagnosis present

## 2018-11-23 DIAGNOSIS — N179 Acute kidney failure, unspecified: Secondary | ICD-10-CM | POA: Diagnosis present

## 2018-11-23 DIAGNOSIS — E1151 Type 2 diabetes mellitus with diabetic peripheral angiopathy without gangrene: Secondary | ICD-10-CM | POA: Diagnosis present

## 2018-11-23 DIAGNOSIS — J9601 Acute respiratory failure with hypoxia: Secondary | ICD-10-CM | POA: Diagnosis not present

## 2018-11-23 DIAGNOSIS — Z885 Allergy status to narcotic agent status: Secondary | ICD-10-CM

## 2018-11-23 DIAGNOSIS — Z79890 Hormone replacement therapy (postmenopausal): Secondary | ICD-10-CM

## 2018-11-23 DIAGNOSIS — E1122 Type 2 diabetes mellitus with diabetic chronic kidney disease: Secondary | ICD-10-CM | POA: Diagnosis present

## 2018-11-23 DIAGNOSIS — Z833 Family history of diabetes mellitus: Secondary | ICD-10-CM

## 2018-11-23 DIAGNOSIS — Z794 Long term (current) use of insulin: Secondary | ICD-10-CM

## 2018-11-23 DIAGNOSIS — J189 Pneumonia, unspecified organism: Secondary | ICD-10-CM | POA: Diagnosis not present

## 2018-11-23 DIAGNOSIS — E785 Hyperlipidemia, unspecified: Secondary | ICD-10-CM | POA: Diagnosis present

## 2018-11-23 DIAGNOSIS — Z89512 Acquired absence of left leg below knee: Secondary | ICD-10-CM

## 2018-11-23 DIAGNOSIS — T827XXA Infection and inflammatory reaction due to other cardiac and vascular devices, implants and grafts, initial encounter: Secondary | ICD-10-CM | POA: Diagnosis not present

## 2018-11-23 DIAGNOSIS — I130 Hypertensive heart and chronic kidney disease with heart failure and stage 1 through stage 4 chronic kidney disease, or unspecified chronic kidney disease: Secondary | ICD-10-CM | POA: Diagnosis not present

## 2018-11-23 DIAGNOSIS — Z79891 Long term (current) use of opiate analgesic: Secondary | ICD-10-CM

## 2018-11-23 DIAGNOSIS — E871 Hypo-osmolality and hyponatremia: Secondary | ICD-10-CM | POA: Diagnosis not present

## 2018-11-23 DIAGNOSIS — C8409 Mycosis fungoides, extranodal and solid organ sites: Secondary | ICD-10-CM | POA: Diagnosis not present

## 2018-11-23 DIAGNOSIS — K219 Gastro-esophageal reflux disease without esophagitis: Secondary | ICD-10-CM | POA: Diagnosis present

## 2018-11-23 DIAGNOSIS — N183 Chronic kidney disease, stage 3 (moderate): Secondary | ICD-10-CM | POA: Diagnosis not present

## 2018-11-23 DIAGNOSIS — T380X5A Adverse effect of glucocorticoids and synthetic analogues, initial encounter: Secondary | ICD-10-CM | POA: Diagnosis not present

## 2018-11-23 DIAGNOSIS — Y92230 Patient room in hospital as the place of occurrence of the external cause: Secondary | ICD-10-CM | POA: Diagnosis not present

## 2018-11-23 DIAGNOSIS — E1142 Type 2 diabetes mellitus with diabetic polyneuropathy: Secondary | ICD-10-CM | POA: Diagnosis present

## 2018-11-23 DIAGNOSIS — Z862 Personal history of diseases of the blood and blood-forming organs and certain disorders involving the immune mechanism: Secondary | ICD-10-CM

## 2018-11-23 DIAGNOSIS — Z95810 Presence of automatic (implantable) cardiac defibrillator: Secondary | ICD-10-CM

## 2018-11-23 DIAGNOSIS — N182 Chronic kidney disease, stage 2 (mild): Secondary | ICD-10-CM | POA: Diagnosis present

## 2018-11-23 DIAGNOSIS — D631 Anemia in chronic kidney disease: Secondary | ICD-10-CM | POA: Diagnosis not present

## 2018-11-23 DIAGNOSIS — I5042 Chronic combined systolic (congestive) and diastolic (congestive) heart failure: Secondary | ICD-10-CM | POA: Diagnosis not present

## 2018-11-23 DIAGNOSIS — I472 Ventricular tachycardia: Secondary | ICD-10-CM | POA: Diagnosis present

## 2018-11-23 DIAGNOSIS — Z79899 Other long term (current) drug therapy: Secondary | ICD-10-CM

## 2018-11-23 DIAGNOSIS — Z7901 Long term (current) use of anticoagulants: Secondary | ICD-10-CM

## 2018-11-23 DIAGNOSIS — Z8249 Family history of ischemic heart disease and other diseases of the circulatory system: Secondary | ICD-10-CM

## 2018-11-23 DIAGNOSIS — R0902 Hypoxemia: Secondary | ICD-10-CM | POA: Diagnosis not present

## 2018-11-23 DIAGNOSIS — R918 Other nonspecific abnormal finding of lung field: Secondary | ICD-10-CM | POA: Diagnosis not present

## 2018-11-23 DIAGNOSIS — I2510 Atherosclerotic heart disease of native coronary artery without angina pectoris: Secondary | ICD-10-CM | POA: Diagnosis present

## 2018-11-23 DIAGNOSIS — R05 Cough: Secondary | ICD-10-CM | POA: Diagnosis not present

## 2018-11-23 DIAGNOSIS — K029 Dental caries, unspecified: Secondary | ICD-10-CM | POA: Diagnosis present

## 2018-11-23 DIAGNOSIS — Z888 Allergy status to other drugs, medicaments and biological substances status: Secondary | ICD-10-CM

## 2018-11-23 DIAGNOSIS — E1165 Type 2 diabetes mellitus with hyperglycemia: Secondary | ICD-10-CM | POA: Diagnosis not present

## 2018-11-23 DIAGNOSIS — Z86718 Personal history of other venous thrombosis and embolism: Secondary | ICD-10-CM

## 2018-11-23 DIAGNOSIS — I5022 Chronic systolic (congestive) heart failure: Secondary | ICD-10-CM | POA: Diagnosis present

## 2018-11-23 DIAGNOSIS — Z452 Encounter for adjustment and management of vascular access device: Secondary | ICD-10-CM | POA: Diagnosis not present

## 2018-11-23 DIAGNOSIS — C8400 Mycosis fungoides, unspecified site: Secondary | ICD-10-CM | POA: Diagnosis not present

## 2018-11-23 DIAGNOSIS — B3781 Candidal esophagitis: Secondary | ICD-10-CM | POA: Diagnosis not present

## 2018-11-23 DIAGNOSIS — R0602 Shortness of breath: Secondary | ICD-10-CM | POA: Diagnosis not present

## 2018-11-23 LAB — COMPREHENSIVE METABOLIC PANEL
ALT: 20 U/L (ref 0–44)
AST: 33 U/L (ref 15–41)
Albumin: 3.3 g/dL — ABNORMAL LOW (ref 3.5–5.0)
Alkaline Phosphatase: 114 U/L (ref 38–126)
Anion gap: 10 (ref 5–15)
BUN: 18 mg/dL (ref 8–23)
CHLORIDE: 101 mmol/L (ref 98–111)
CO2: 21 mmol/L — ABNORMAL LOW (ref 22–32)
Calcium: 8.9 mg/dL (ref 8.9–10.3)
Creatinine, Ser: 1.31 mg/dL — ABNORMAL HIGH (ref 0.61–1.24)
GFR calc Af Amer: >60 mL/min (ref >60)
GFR, EST NON AFRICAN AMERICAN: 56 mL/min — AB (ref >60)
Glucose, Bld: 126 mg/dL — ABNORMAL HIGH (ref 70–99)
Potassium: 4.3 mmol/L (ref 3.5–5.1)
SODIUM: 132 mmol/L — AB (ref 135–145)
Total Bilirubin: 0.5 mg/dL (ref 0.3–1.2)
Total Protein: 6 g/dL — ABNORMAL LOW (ref 6.5–8.1)

## 2018-11-23 LAB — CBC WITH DIFFERENTIAL/PLATELET
Abs Immature Granulocytes: 0.03 10*3/uL (ref 0.00–0.07)
Basophils Absolute: 0 10*3/uL (ref 0.0–0.1)
Basophils Relative: 1 %
Eosinophils Absolute: 0.5 10*3/uL (ref 0.0–0.5)
Eosinophils Relative: 7 %
HCT: 32.8 % — ABNORMAL LOW (ref 39.0–52.0)
Hemoglobin: 10.6 g/dL — ABNORMAL LOW (ref 13.0–17.0)
Immature Granulocytes: 0 %
Lymphocytes Relative: 18 %
Lymphs Abs: 1.3 10*3/uL (ref 0.7–4.0)
MCH: 27 pg (ref 26.0–34.0)
MCHC: 32.3 g/dL (ref 30.0–36.0)
MCV: 83.5 fL (ref 80.0–100.0)
Monocytes Absolute: 1 10*3/uL (ref 0.1–1.0)
Monocytes Relative: 14 %
Neutro Abs: 4.3 10*3/uL (ref 1.7–7.7)
Neutrophils Relative %: 60 %
Platelets: 255 10*3/uL (ref 150–400)
RBC: 3.93 MIL/uL — AB (ref 4.22–5.81)
RDW: 16.1 % — ABNORMAL HIGH (ref 11.5–15.5)
WBC: 7.1 10*3/uL (ref 4.0–10.5)
nRBC: 0 % (ref 0.0–0.2)

## 2018-11-23 LAB — TROPONIN I: Troponin I: <0.03 ng/mL (ref <0.03)

## 2018-11-23 LAB — BRAIN NATRIURETIC PEPTIDE: B Natriuretic Peptide: 24.4 pg/mL (ref 0.0–100.0)

## 2018-11-23 LAB — GLUCOSE, CAPILLARY: Glucose-Capillary: 103 mg/dL — ABNORMAL HIGH (ref 70–99)

## 2018-11-23 LAB — INFLUENZA PANEL BY PCR (TYPE A & B)
Influenza A By PCR: NEGATIVE
Influenza B By PCR: NEGATIVE

## 2018-11-23 LAB — CBG MONITORING, ED: Glucose-Capillary: 100 mg/dL — ABNORMAL HIGH (ref 70–99)

## 2018-11-23 MED ORDER — INSULIN ASPART 100 UNIT/ML ~~LOC~~ SOLN
0.0000 [IU] | Freq: Three times a day (TID) | SUBCUTANEOUS | Status: DC
  Administered 2018-11-24 (×2): 3 [IU] via SUBCUTANEOUS
  Administered 2018-11-25 (×2): 2 [IU] via SUBCUTANEOUS
  Administered 2018-11-25 – 2018-11-26 (×2): 5 [IU] via SUBCUTANEOUS
  Administered 2018-11-26: 11 [IU] via SUBCUTANEOUS
  Administered 2018-11-26: 15 [IU] via SUBCUTANEOUS
  Administered 2018-11-27: 11 [IU] via SUBCUTANEOUS
  Administered 2018-11-27: 8 [IU] via SUBCUTANEOUS

## 2018-11-23 MED ORDER — HYDROXYZINE HCL 25 MG PO TABS: 25.0000 mg | ORAL_TABLET | Freq: Three times a day (TID) | ORAL | Status: DC | PRN

## 2018-11-23 MED ORDER — CARVEDILOL 25 MG PO TABS
25.0000 mg | ORAL_TABLET | Freq: Two times a day (BID) | ORAL | Status: DC
  Administered 2018-11-24 – 2018-11-27 (×6): 25 mg via ORAL
  Filled 2018-11-23 (×6): qty 1

## 2018-11-23 MED ORDER — PANTOPRAZOLE SODIUM 40 MG PO TBEC
40.0000 mg | DELAYED_RELEASE_TABLET | Freq: Every day | ORAL | Status: DC
  Administered 2018-11-24 – 2018-11-27 (×4): 40 mg via ORAL
  Filled 2018-11-23 (×4): qty 1

## 2018-11-23 MED ORDER — FLUCONAZOLE 100 MG PO TABS
100.0000 mg | ORAL_TABLET | Freq: Every day | ORAL | Status: AC
  Administered 2018-11-24 – 2018-11-27 (×4): 100 mg via ORAL
  Filled 2018-11-23 (×4): qty 1

## 2018-11-23 MED ORDER — ONDANSETRON HCL 4 MG/2ML IJ SOLN: 4.0000 mg | Freq: Four times a day (QID) | INTRAMUSCULAR | Status: DC | PRN

## 2018-11-23 MED ORDER — IOPAMIDOL (ISOVUE-370) INJECTION 76%
100.0000 mL | Freq: Once | INTRAVENOUS | Status: AC | PRN
  Administered 2018-11-23: 63 mL via INTRAVENOUS

## 2018-11-23 MED ORDER — FUROSEMIDE 40 MG PO TABS
80.0000 mg | ORAL_TABLET | Freq: Two times a day (BID) | ORAL | Status: DC
  Filled 2018-11-23 (×7): qty 2

## 2018-11-23 MED ORDER — INSULIN ASPART 100 UNIT/ML ~~LOC~~ SOLN
0.0000 [IU] | Freq: Every day | SUBCUTANEOUS | Status: DC
  Administered 2018-11-25: 4 [IU] via SUBCUTANEOUS
  Administered 2018-11-26: 5 [IU] via SUBCUTANEOUS

## 2018-11-23 MED ORDER — MELATONIN 3 MG PO TABS
1.0000 | ORAL_TABLET | Freq: Every day | ORAL | Status: DC
  Administered 2018-11-24 – 2018-11-26 (×3): 3 mg via ORAL
  Filled 2018-11-23 (×4): qty 1

## 2018-11-23 MED ORDER — RIVAROXABAN 20 MG PO TABS
20.0000 mg | ORAL_TABLET | Freq: Every day | ORAL | Status: DC
  Administered 2018-11-23 – 2018-11-26 (×4): 20 mg via ORAL
  Filled 2018-11-23 (×4): qty 1

## 2018-11-23 MED ORDER — ACETAMINOPHEN 325 MG PO TABS
650.0000 mg | ORAL_TABLET | Freq: Four times a day (QID) | ORAL | Status: DC | PRN
  Administered 2018-11-24 – 2018-11-27 (×2): 650 mg via ORAL
  Filled 2018-11-23 (×2): qty 2

## 2018-11-23 MED ORDER — IPRATROPIUM-ALBUTEROL 0.5-2.5 (3) MG/3ML IN SOLN
3.0000 mL | Freq: Four times a day (QID) | RESPIRATORY_TRACT | Status: DC
  Administered 2018-11-23: 3 mL via RESPIRATORY_TRACT
  Filled 2018-11-23: qty 3

## 2018-11-23 MED ORDER — DEXTROSE 50 % IV SOLN: 25.0000 g | INTRAVENOUS | Status: AC

## 2018-11-23 MED ORDER — SODIUM CHLORIDE 0.9 % IV SOLN
1.0000 g | INTRAVENOUS | Status: DC
  Administered 2018-11-23 – 2018-11-26 (×4): 1 g via INTRAVENOUS
  Filled 2018-11-23: qty 1
  Filled 2018-11-23: qty 10
  Filled 2018-11-23 (×2): qty 1

## 2018-11-23 MED ORDER — ENSURE ENLIVE PO LIQD
237.0000 mL | Freq: Two times a day (BID) | ORAL | Status: DC
  Administered 2018-11-24 – 2018-11-27 (×7): 237 mL via ORAL

## 2018-11-23 MED ORDER — ATORVASTATIN CALCIUM 10 MG PO TABS
20.0000 mg | ORAL_TABLET | Freq: Every day | ORAL | Status: DC
  Administered 2018-11-24 – 2018-11-26 (×3): 20 mg via ORAL
  Filled 2018-11-23 (×3): qty 2

## 2018-11-23 MED ORDER — IPRATROPIUM-ALBUTEROL 0.5-2.5 (3) MG/3ML IN SOLN
3.0000 mL | Freq: Two times a day (BID) | RESPIRATORY_TRACT | Status: DC
  Administered 2018-11-24 – 2018-11-27 (×7): 3 mL via RESPIRATORY_TRACT
  Filled 2018-11-23 (×7): qty 3

## 2018-11-23 MED ORDER — ALBUTEROL SULFATE (2.5 MG/3ML) 0.083% IN NEBU: 2.5000 mg | INHALATION_SOLUTION | RESPIRATORY_TRACT | Status: DC | PRN

## 2018-11-23 MED ORDER — IOPAMIDOL (ISOVUE-370) INJECTION 76%
INTRAVENOUS | Status: AC
  Filled 2018-11-23: qty 100

## 2018-11-23 MED ORDER — GUAIFENESIN ER 600 MG PO TB12
1200.0000 mg | ORAL_TABLET | Freq: Two times a day (BID) | ORAL | Status: DC
  Administered 2018-11-24 – 2018-11-27 (×7): 1200 mg via ORAL
  Filled 2018-11-23 (×7): qty 2

## 2018-11-23 MED ORDER — FLUCONAZOLE 100 MG PO TABS: 100.0000 mg | ORAL_TABLET | Freq: Every day | ORAL | Status: DC

## 2018-11-23 MED ORDER — SODIUM CHLORIDE (PF) 0.9 % IJ SOLN
INTRAMUSCULAR | Status: AC
  Filled 2018-11-23: qty 50

## 2018-11-23 MED ORDER — SODIUM CHLORIDE 0.9 % IV SOLN
500.0000 mg | INTRAVENOUS | Status: DC
  Administered 2018-11-23 – 2018-11-25 (×3): 500 mg via INTRAVENOUS
  Filled 2018-11-23 (×3): qty 500

## 2018-11-23 NOTE — ED Triage Notes (Addendum)
Pt BIB EMS from home. Pt reports shortness of breath since this morning. Pt able to ambulate with walker. Pt was 91% RA and placed on 4L and bumped up to 100%. Denies chest pain and dizziness. Hx of CHF. Home health nurse reports 6 cm distention in abdomen. Pt reports some nausea for the past few days.  138/96 HR 100 RR 20 100% on 4L CBG 160

## 2018-11-23 NOTE — ED Notes (Signed)
Patient transported to CT 

## 2018-11-23 NOTE — ED Notes (Signed)
Patient transported to X-ray 
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
## 2018-11-23 NOTE — Telephone Encounter (Signed)
Blank note,  on previous note that Dr.Berry suggest patient see PCP. [facility] nurse verbalized understanding.

## 2018-11-23 NOTE — Telephone Encounter (Signed)
[facility] nurse called in, stating that patient was SOB, over the past 3-4 days, along with cough and post nasal drip, patient also had stable weights, denies swelling, denies CP, patient had temp of 100.9. Patients 02 stat was 92% at rest. Advised with Dr.Berry who advised for patient to see PCP.  Called and notified [facility].

## 2018-11-23 NOTE — ED Provider Notes (Signed)
CT scan reviewed.  No PE.  I will consult hospitalist for admission per Dr Eddie Dibbles plan.   Eric Rank, MD 11/23/18 1719
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
## 2018-11-23 NOTE — ED Notes (Signed)
ED TO INPATIENT HANDOFF REPORT  Name/Age/Gender Eric Price 68 y.o. male  Code Status Code Status History    Date Active Date Inactive Code Status Order ID Comments User Context   08/23/2018 2109 08/26/2018 1501 Full Code [id number]  Shela Leff, MD Inpatient   03/08/2018 0339 03/14/2018 2040 Full Code [id number]  Vianne Bulls, MD ED   08/27/2015 2008 08/28/2015 1653 Full Code [id number]  [name], MD Inpatient   03/08/2015 1745 03/16/2015 1214 Full Code [id number]  Cathlyn Parsons, PA-C Inpatient   03/05/2015 1258 03/08/2015 1745 Full Code [id number]  Gabriel Earing, PA-C Inpatient   02/21/2015 1152 02/22/2015 1612 Full Code [id number]  [name], MD Inpatient   02/21/2015 0348 02/21/2015 1152 Full Code [id number]  Lily Kocher, MD Inpatient   10/19/2013 2318 10/20/2013 2034 Full Code [id number]  Orson Eva, MD Inpatient      Home/SNF/Other Home  Chief Complaint short of breath   Level of Care/Admitting Diagnosis ED Disposition    ED Disposition Condition [facility] Area: [facility] [[id number]]  Level of Care: Telemetry [5]  Admit to tele based on following criteria: Monitor for Ischemic changes  Diagnosis: CAP (community acquired pneumonia) [[id number]]  Admitting Physician: Kayleen Memos [[id number]]  Attending Physician: Kayleen Memos [[id number]]  Estimated length of stay: past midnight tomorrow  Certification:: I certify this patient will need inpatient services for at least 2 midnights  PT Class (Do Not Modify): Inpatient [101]  PT Acc Code (Do Not Modify): Private [1]       Medical History Past Medical History:  Diagnosis Date  . Arthritis    knees, shoulder, hands   . Chronic combined systolic and diastolic CHF (congestive heart failure) ([place]) 07/17/2016  . Coronary artery disease   . Critical lower limb ischemia   . Depression   . Hyperlipidemia   . Hypertension   . 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   . Nonischemic cardiomyopathy ([place])   . Peripheral vascular disease ([place])   . Pneumonia 2013   hosp. - MCH x1 week   . Renal insufficiency 03/08/2018  . Shortness of breath dyspnea    related to pain currently  . SIRS (systemic inflammatory response syndrome) ([place]) 03/2018  . Sleep apnea    10-20 yrs. ago, states he used CPAP, not needed anymore.   . Type II diabetes mellitus ([facility])     Allergies Allergies  Allergen Reactions  . Morphine Shortness Of Breath and Anaphylaxis  . Morphine And Related     "took my breath away"  . Oxycodone     Pt stated, "It makes me climbs walls; fight wars"  . Robaxin [Methocarbamol] Other (See Comments)    IV Location/Drains/Wounds Patient Lines/Drains/Airways Status   Active Line/Drains/Airways    Name:   Placement date:   Placement time:   Site:   Days:   Peripheral IV 11/23/18 Left Antecubital   11/23/18    1317    Antecubital   less than 1   PICC Single Lumen 03/13/18 PICC Right Brachial 40 cm 1 cm   03/13/18    1329    Brachial   255   Incision (Closed) 03/05/15 Leg Left   03/05/15    0842     1359   Incision (Closed) 03/06/15 Leg Left   03/06/15    2350     1358   Incision (Closed) 08/27/15 Chest  Left   08/27/15    1930     1184          Labs/Imaging Results for orders placed or performed during the hospital encounter of 11/23/18 (from the past 48 hour(s))  Comprehensive metabolic panel     Status: Abnormal   Collection Time: 11/23/18  1:18 PM  Result Value Ref Range   Sodium 132 (L) 135 - 145 mmol/L   Potassium 4.3 3.5 - 5.1 mmol/L   Chloride 101 98 - 111 mmol/L   CO2 21 (L) 22 - 32 mmol/L   Glucose, Bld 126 (H) 70 - 99 mg/dL   BUN 18 8 - 23 mg/dL   Creatinine, Ser 1.31 (H) 0.61 - 1.24 mg/dL   Calcium 8.9 8.9 - 10.3 mg/dL   Total Protein 6.0 (L) 6.5 - 8.1 g/dL   Albumin 3.3 (L) 3.5 - 5.0 g/dL   AST 33 15 - 41 U/L   ALT 20 0 - 44 U/L   Alkaline Phosphatase 114 38 - 126 U/L   Total  Bilirubin 0.5 0.3 - 1.2 mg/dL   GFR calc non Af Amer 56 (L) >60 mL/min   GFR calc Af Amer >60 >60 mL/min   Anion gap 10 5 - 15    Comment: Performed at [facility], [place] [address] [place], [place] [address]  CBC with Differential     Status: Abnormal   Collection Time: 11/23/18  1:18 PM  Result Value Ref Range   WBC 7.1 4.0 - 10.5 K/uL   RBC 3.93 (L) 4.22 - 5.81 MIL/uL   Hemoglobin 10.6 (L) 13.0 - 17.0 g/dL   HCT 32.8 (L) 39.0 - 52.0 %   MCV 83.5 80.0 - 100.0 fL   MCH 27.0 26.0 - 34.0 pg   MCHC 32.3 30.0 - 36.0 g/dL   RDW 16.1 (H) 11.5 - 15.5 %   Platelets 255 150 - 400 K/uL   nRBC 0.0 0.0 - 0.2 %   Neutrophils Relative % 60 %   Neutro Abs 4.3 1.7 - 7.7 K/uL   Lymphocytes Relative 18 %   Lymphs Abs 1.3 0.7 - 4.0 K/uL   Monocytes Relative 14 %   Monocytes Absolute 1.0 0.1 - 1.0 K/uL   Eosinophils Relative 7 %   Eosinophils Absolute 0.5 0.0 - 0.5 K/uL   Basophils Relative 1 %   Basophils Absolute 0.0 0.0 - 0.1 K/uL   WBC Morphology MORPHOLOGY UNREMARKABLE    Immature Granulocytes 0 %   Abs Immature Granulocytes 0.03 0.00 - 0.07 K/uL    Comment: Performed at [facility], [place] [address], [place], [place] [address]  Troponin I - Once     Status: None   Collection Time: 11/23/18  1:18 PM  Result Value Ref Range   Troponin I <0.03 <0.03 ng/mL    Comment: Performed at [facility], [place] [address], [place], [place] [address]  Brain natriuretic peptide     Status: None   Collection Time: 11/23/18  1:18 PM  Result Value Ref Range   B Natriuretic Peptide 24.4 0.0 - 100.0 pg/mL    Comment: Performed at [facility], [place] [address], [place], [place] [address]  CBG monitoring, ED     Status: Abnormal   Collection Time: 11/23/18  2:32 PM  Result Value Ref Range   Glucose-Capillary 100 (H) 70 - 99 mg/dL   Dg Chest 2 View  Result Date: 11/23/2018 CLINICAL DATA:  Increased sob,  decreased O2 sats this a.m., hx  of chf EXAM: CHEST - 2 VIEW COMPARISON:  11/17/2018 FINDINGS: Patient has a LEFT-sided transvenous pacemaker with leads to the RIGHT ventricle. A RIGHT-sided PICC line tip overlies the superior vena cava. Heart size is normal. Interval development of airspace filling opacities throughout the LEFT lung and consistent with infectious process. No pleural effusions. No evidence for pulmonary edema. Degenerative changes are seen in thoracic spine. IMPRESSION: Interval development of LEFT lung infiltrates. Electronically Signed   By: Elizabeth  Brown M.D.   On: 11/23/2018 13:33   Ct Angio Chest Pe W/cm &/or Wo Cm  Result Date: 11/23/2018 CLINICAL DATA:  Shortness of breath and hypoxia. EXAM: CT ANGIOGRAPHY CHEST WITH CONTRAST TECHNIQUE: Multidetector CT imaging of the chest was performed using the standard protocol during bolus administration of intravenous contrast. Multiplanar CT image reconstructions and MIPs were obtained to evaluate the vascular anatomy. CONTRAST:  63 mL Isovue 370 IV COMPARISON:  Chest x-ray earlier today. FINDINGS: Cardiovascular: The pulmonary arteries are adequately opacified. There is some limited evaluation of distal branches due to respiratory motion. No evidence of pulmonary embolism. The thoracic aorta is normal in caliber and demonstrates mild atherosclerosis. No evidence of aortic dissection. The heart size is normal. ICD device present with single lead extending to the right ventricular apex. No pericardial fluid identified. Calcified plaque is noted in the distribution of the LAD. There likely is calcified plaque in the RCA but the artery is significantly blurred by motion. Mediastinum/Nodes: There are some scattered small nonenlarged lymph nodes in the mediastinum. The largest in the lower right paratracheal region measures 10 mm in short axis. No hilar lymphadenopathy identified. Nonenlarged right axillary lymph node measures 10 mm in short axis. There are some surgical clips in  the right axilla. Lungs/Pleura: Lungs show diffuse central airspace opacity throughout the left upper lobe and extending into the lingula with some extension into the left lower lobe. Airspace opacity spares the immediate subpleural lung. There also is airspace disease in the superior aspect of the right upper lobe. Findings are felt to most likely relate to pulmonary edema. More additional focal airspace opacity in the inferior lingula is more dense and measures approximately 1.4 x 2.0 cm. Small calcified granuloma of the anterior left upper lobe measures 3 mm. No significant pleural effusions. No pneumothorax. Upper Abdomen: No acute abnormality. Musculoskeletal: No chest wall abnormality. No acute or significant osseous findings. Review of the MIP images confirms the above findings. IMPRESSION: 1. Airspace disease throughout the left upper lobe and lingula with some extension into the left lower lobe. There also is some airspace disease in the right upper lobe. Findings are felt to most likely relate to pulmonary edema. Infection is felt less likely given the peripheral sparing but is in the differential. Other differential considerations include aspiration and pulmonary hemorrhage. More focal opacity in the inferior lingula likely relates to the same process but is more discrete and it would be worth obtaining a follow-up chest CT to make sure this resolves. 2. ICD present with single lead extending to the right ventricular apex. 3. Coronary atherosclerosis with calcified plaque mainly in the distribution of the LAD. 4. Nonenlarged 10 mm right paratracheal and right axillary lymph nodes. Electronically Signed   By: Glenn  Yamagata M.D.   On: 11/23/2018 16:46    Pending Labs Unresulted Labs (From admission, onward)   None      Vitals/Pain Today's Vitals   11/23/18 1254 11/23/18 1258 11/23/18 1500 11/23/18 1721    BP:  121/77 (!) 125/92 116/68  Pulse:  100 95 (!) 101  Resp:  (!) _0 Temp:  99.1  F (37.3 C)    SpO2: (!) 88% 95% 96% 95%  Weight:      Height:      PainSc:        Isolation Precautions No active isolations  Medications Medications  sodium chloride (PF) 0.9 % injection (has no administration in time range)  iopamidol (ISOVUE-370) 76 % injection 100 mL (63 mLs Intravenous Contrast Given 11/23/18 1623)    Mobility walks

## 2018-11-24 LAB — RESPIRATORY PANEL BY PCR
Adenovirus: NOT DETECTED
Bordetella pertussis: NOT DETECTED
CORONAVIRUS HKU1-RVPPCR: NOT DETECTED
Chlamydophila pneumoniae: NOT DETECTED
Coronavirus 229E: NOT DETECTED
Coronavirus NL63: NOT DETECTED
Coronavirus OC43: NOT DETECTED
Influenza A: NOT DETECTED
Influenza B: NOT DETECTED
MYCOPLASMA PNEUMONIAE-RVPPCR: NOT DETECTED
Metapneumovirus: NOT DETECTED
Parainfluenza Virus 1: NOT DETECTED
Parainfluenza Virus 2: NOT DETECTED
Parainfluenza Virus 3: NOT DETECTED
Parainfluenza Virus 4: NOT DETECTED
Respiratory Syncytial Virus: NOT DETECTED
Rhinovirus / Enterovirus: NOT DETECTED

## 2018-11-24 LAB — CBC WITH DIFFERENTIAL/PLATELET
Abs Immature Granulocytes: 0.03 10*3/uL (ref 0.00–0.07)
BASOS ABS: 0.1 10*3/uL (ref 0.0–0.1)
Basophils Relative: 1 %
Eosinophils Absolute: 0.8 10*3/uL — ABNORMAL HIGH (ref 0.0–0.5)
Eosinophils Relative: 11 %
HCT: 33.9 % — ABNORMAL LOW (ref 39.0–52.0)
Hemoglobin: 10.6 g/dL — ABNORMAL LOW (ref 13.0–17.0)
IMMATURE GRANULOCYTES: 0 %
Lymphocytes Relative: 16 %
Lymphs Abs: 1.1 10*3/uL (ref 0.7–4.0)
MCH: 26.7 pg (ref 26.0–34.0)
MCHC: 31.3 g/dL (ref 30.0–36.0)
MCV: 85.4 fL (ref 80.0–100.0)
Monocytes Absolute: 1 10*3/uL (ref 0.1–1.0)
Monocytes Relative: 14 %
NEUTROS PCT: 58 %
Neutro Abs: 4 10*3/uL (ref 1.7–7.7)
Platelets: 252 10*3/uL (ref 150–400)
RBC: 3.97 MIL/uL — ABNORMAL LOW (ref 4.22–5.81)
RDW: 16 % — AB (ref 11.5–15.5)
WBC: 6.9 10*3/uL (ref 4.0–10.5)
nRBC: 0 % (ref 0.0–0.2)

## 2018-11-24 LAB — BASIC METABOLIC PANEL
Anion gap: 8 (ref 5–15)
BUN: 13 mg/dL (ref 8–23)
CO2: 24 mmol/L (ref 22–32)
CREATININE: 1.09 mg/dL (ref 0.61–1.24)
Calcium: 9.1 mg/dL (ref 8.9–10.3)
Chloride: 102 mmol/L (ref 98–111)
GFR calc non Af Amer: >60 mL/min (ref >60)
Glucose, Bld: 107 mg/dL — ABNORMAL HIGH (ref 70–99)
Potassium: 4.4 mmol/L (ref 3.5–5.1)
Sodium: 134 mmol/L — ABNORMAL LOW (ref 135–145)

## 2018-11-24 LAB — GLUCOSE, CAPILLARY
Glucose-Capillary: 114 mg/dL — ABNORMAL HIGH (ref 70–99)
Glucose-Capillary: 186 mg/dL — ABNORMAL HIGH (ref 70–99)
Glucose-Capillary: 178 mg/dL — ABNORMAL HIGH (ref 70–99)
Glucose-Capillary: 114 mg/dL — ABNORMAL HIGH (ref 70–99)

## 2018-11-24 LAB — PROCALCITONIN: Procalcitonin: <0.1 ng/mL

## 2018-11-24 LAB — MAGNESIUM: Magnesium: 1.8 mg/dL (ref 1.7–2.4)

## 2018-11-24 MED ORDER — ORAL CARE MOUTH RINSE
15.0000 mL | Freq: Two times a day (BID) | OROMUCOSAL | Status: DC
  Administered 2018-11-24 – 2018-11-27 (×6): 15 mL via OROMUCOSAL

## 2018-11-24 MED ORDER — BENZONATATE 100 MG PO CAPS
200.0000 mg | ORAL_CAPSULE | Freq: Three times a day (TID) | ORAL | Status: DC | PRN
  Administered 2018-11-24 – 2018-11-27 (×3): 200 mg via ORAL
  Filled 2018-11-24 (×3): qty 2

## 2018-11-24 NOTE — Evaluation (Signed)
Physical Therapy Evaluation Patient Details Name: Eric Price MRN: [id number] DOB: 1950/11/11 Today's Date: 11/24/2018   History of Present Illness  68 yo male admitted with Pna. Hx of L BKA-wears prosthesis, CHF, PVD, DM, neuropahty, CAD  Clinical Impression  On eval, pt was Min guard assist for mobility. He walked ~40 feet with a RW. O2 sat 95% on 3L [place] at rest, 90% on RA at rest, 83% on RA during ambulation. Will plan to follow and progress activity as tolerated. Recommend HHPT f/u.     Follow Up Recommendations Home health PT    Equipment Recommendations  None recommended by PT    Recommendations for Other Services       Precautions / Restrictions Precautions Precautions: Fall Precaution Comments: monitor O2 Required Braces or Orthoses: (L BKA-wears prosthesis) Restrictions Weight Bearing Restrictions: No      Mobility  Bed Mobility Overal bed mobility: Needs Assistance Bed Mobility: Supine to Sit     Supine to sit: Min guard;HOB elevated     General bed mobility comments: Increased time. Close guard for safety. Pt was able to don prosthesis without assistance  Transfers Overall transfer level: Needs assistance Equipment used: Rolling walker (2 wheeled) Transfers: Sit to/from Stand Sit to Stand: Min guard;From elevated surface         General transfer comment: Close guard for safety. VCs safety, hand placement.   Ambulation/Gait Ambulation/Gait assistance: Min guard Gait Distance (Feet): 40 Feet Assistive device: Rolling walker (2 wheeled) Gait Pattern/deviations: Step-through pattern;Decreased stride length     General Gait Details: Close guard for safety. O2 sat level dropped to 83% on RA, dyspnea 3/4. Deferred any further ambulation  Stairs            Wheelchair Mobility    Modified Rankin (Stroke Patients Only)       Balance Overall balance assessment: Mild deficits observed, not formally tested                                           Pertinent Vitals/Pain Pain Assessment: No/denies pain    Home Living Family/patient expects to be discharged to:: Private residence Living Arrangements: Spouse/significant other Available Help at Discharge: Family;Available 24 hours/day Type of Home: House Home Access: Stairs to enter Entrance Stairs-Rails: None Entrance Stairs-Number of Steps: 3 Home Layout: Two level;Able to live on main level with bedroom/bathroom Home Equipment: Gilford Rile - 2 wheels;Cane - single point;Bedside commode;Tub bench      Prior Function Level of Independence: Independent with assistive device(s)               Hand Dominance   Dominant Hand: Right    Extremity/Trunk Assessment   Upper Extremity Assessment Upper Extremity Assessment: Overall WFL for tasks assessed    Lower Extremity Assessment Lower Extremity Assessment: Generalized weakness;LLE deficits/detail LLE Deficits / Details: BKA    Cervical / Trunk Assessment Cervical / Trunk Assessment: Normal  Communication   Communication: No difficulties  Cognition Arousal/Alertness: Awake/alert Behavior During Therapy: WFL for tasks assessed/performed Overall Cognitive Status: Within Functional Limits for tasks assessed                                        General Comments      Exercises     Assessment/Plan  PT Assessment Patient needs continued PT services  PT Problem List Decreased mobility;Decreased activity tolerance       PT Treatment Interventions DME instruction;Gait training;Functional mobility training;Therapeutic activities;Balance training;Patient/family education;Therapeutic exercise    PT Goals (Current goals can be found in the Care Plan section)  Acute Rehab PT Goals Patient Stated Goal: to walk and get better PT Goal Formulation: With patient/family Time For Goal Achievement: 12/08/18 Potential to Achieve Goals: Good    Frequency Min 3X/week   Barriers to  discharge        Co-evaluation               AM-PAC PT "6 Clicks" Mobility  Outcome Measure Help needed turning from your back to your side while in a flat bed without using bedrails?: A Little Help needed moving from lying on your back to sitting on the side of a flat bed without using bedrails?: A Little Help needed moving to and from a bed to a chair (including a wheelchair)?: A Little Help needed standing up from a chair using your arms (e.g., wheelchair or bedside chair)?: A Little Help needed to walk in hospital room?: A Little Help needed climbing 3-5 steps with a railing? : A Lot 6 Click Score: 17    End of Session Equipment Utilized During Treatment: Gait belt Activity Tolerance: Patient limited by fatigue(limited by low O2 sats) Patient left: in chair;with call bell/phone within reach;with family/visitor present   PT Visit Diagnosis: Difficulty in walking, not elsewhere classified (R26.2)    Time: 2706-2376 PT Time Calculation (min) (ACUTE ONLY): 21 min   Charges:   PT Evaluation $PT Eval Moderate Complexity: [place], PT Acute Rehabilitation Services Pager: [phone] Office: [phone]

## 2018-11-24 NOTE — Progress Notes (Addendum)
PROGRESS NOTE  Eric Price BHA:[id number] DOB: 05/12/1951 DOA: 11/23/2018 PCP: [name], MD  HPI/Recap of past 24 hours: Eric Price is a 68 y.o. male with medical history significant for hypertension, nonischemic cardiomyopathy, systolic CHF, peripheral vascular disease status post left below the knee amputation, type 2 diabetes, diabetic neuropathy, mycosis fungoides treated at [facility], who presented to [facility] ED with complaints of a fever and 2 weeks duration of associated gradually worsening shortness of breath and productive cough.  Symptoms are also present at rest.  Denies any chest pain.  Denies any sick contacts.  No recent lengthy trips.  No lower extremity edema.  No use of tobacco or alcohol. Last chemotherapy for his mycosis fungoides was 4 weeks ago at [facility]. Per his wife he had a fever of 100.6 at home prior to presenting to the ED, called [facility] and was instructed to come to the nearest ED.   11/24/2018: Patient seen and examined at bedside.  No acute events overnight.  Stable dyspnea with minimal exertion and persistent cough.  Influenza a and B, viral panel negative.  Procalcitonin unremarkable.  Evaluated by PT with recommendations for home health PT.  Assessment/Plan: Active Problems:   CAP (community acquired pneumonia)  Community acquired pneumonia, present admission Presented with 2 weeks duration of productive cough and shortness of breath Per his wife he had a fever of 100.6 at home prior to presenting to the ED, called [facility] and was instructed to come to the nearest ED. Independently reviewed CT chest which showed infiltrates noted on left side, suggestive of pneumonia Continue IV azithromycin and IV Rocephin Negative procalcitonin in the morning Continue duo nebs every 6 hours and Mucinex 1200 mg twice daily Negative influenza A&B Negative respiratory viral panel  Persistent mild acute hypoxic respiratory failure secondary to  community-acquired pneumonia Presented with O2 saturation in the upper 80s Not on O2 supplementation at baseline Maintain O2 saturation greater than 92% Continue duo nebs every 6 hours Mucinex 1200 mg twice daily Obtain home O2 evaluation in the morning for discharge planning  Resolving AKI Appears to be at his baseline creatinine 1.0 with GFR greater than 60 Patient presented with creatinine of 1.31 Continue to avoid nephrotoxic agents Continue to monitor urine output  History of mycosis fungoides Seen by heme oncology at [facility] Completed first round of chemotherapy 4 weeks ago  Next round of chemotherapy will begin this next Wednesday Follow-up outpatient  Euvolemic hyponatremia Sodium 132 Unclear etiology Repeat BMP in the morning  CKD 2 Appears to be at his baseline creatinine of 1.3 with GFR greater than 60 Avoid nephrotoxic agents Monitor urine output Repeat BMP in the morning  Chronic systolic CHF Last 2D echo revealed LVEF 40 to 45% Continue strict I's and O's and daily weight Continue cardiac medications Monitor volume status Continue Lasix  Type 2 diabetes Hemoglobin A1c 5.9 on 11/03/2018 Continue insulin sliding scale  Hyperlipidemia  Continue Lipitor  Hypertension Continue Coreg, Lasix  Diabetic neuropathy Continue Lyrica  History of lower extremity embolism and thrombosis Continue Xarelto  Peripheral vascular disease status post left lower extremity below-the-knee amputation On Xarelto and Lipitor   Risks: High risk for decompensation due to community-acquired pneumonia in the setting of immunosuppression, multiple comorbidities and advanced age.  Patient will require at least 2 midnight for further evaluation and treatment of present condition.    DVT prophylaxis: Xarelto  Code Status: Full code  Family Communication: Wife at bedside.  All questions answered  to her satisfaction.  Disposition Plan:  Possible  discharge to home with home health PT tomorrow 11/25/2018  Consults called: None     Objective: Vitals:   11/23/18 2000 11/23/18 2025 11/24/18 0444 11/24/18 0823  BP:  108/62 (!) 105/58   Pulse:  93 (!) 105 (!) 102  Resp:  16 19 17   Temp:  98 F (36.7 C) 98.7 F (37.1 C)   TempSrc:  Oral Oral   SpO2: 97% 98% 98% 95%  Weight:  91.9 kg 90.2 kg   Height:        Intake/Output Summary (Last 24 hours) at 11/24/2018 1804 Last data filed at 11/24/2018 1447 Gross per 24 hour  Intake 1100 ml  Output 1775 ml  Net -675 ml   Filed Weights   11/23/18 1248 11/23/18 2025 11/24/18 0444  Weight: 97.5 kg 91.9 kg 90.2 kg    Exam:  . General: 68 y.o. year-old male well developed well nourished in no acute distress.  Alert and oriented x3. . Cardiovascular: Regular rate and rhythm with no rubs or gallops.  No thyromegaly or JVD noted.   Marland Kitchen Respiratory: Mild rales at bases with no wheezes.  Poor inspiratory effort. . Abdomen: Soft nontender nondistended with normal bowel sounds x4 quadrants. . Musculoskeletal: No lower extremity edema. 2/4 pulses in all 4 extremities.  Left below the knee amputation noted. Marland Kitchen Psychiatry: Mood is appropriate for condition and setting   Data Reviewed: CBC: Recent Labs  Lab 11/23/18 1318 11/24/18 0645  WBC 7.1 6.9  NEUTROABS 4.3 4.0  HGB 10.6* 10.6*  HCT 32.8* 33.9*  MCV 83.5 85.4  PLT 255 379   Basic Metabolic Panel: Recent Labs  Lab 11/23/18 1318 11/24/18 0645  NA 132* 134*  K 4.3 4.4  CL 101 102  CO2 21* 24  GLUCOSE 126* 107*  BUN 18 13  CREATININE 1.31* 1.09  CALCIUM 8.9 9.1  MG  --  1.8   GFR: Estimated Creatinine Clearance: 72 mL/min (by C-G formula based on SCr of 1.09 mg/dL). Liver Function Tests: Recent Labs  Lab 11/23/18 1318  AST 33  ALT 20  ALKPHOS 114  BILITOT 0.5  PROT 6.0*  ALBUMIN 3.3*   No results for input(s): LIPASE, AMYLASE in the last 168 hours. No results for input(s): AMMONIA in the last 168  hours. Coagulation Profile: No results for input(s): INR, PROTIME in the last 168 hours. Cardiac Enzymes: Recent Labs  Lab 11/23/18 1318  TROPONINI <0.03   BNP (last 3 results) Recent Labs    06/15/18 1644  PROBNP 17.0   HbA1C: No results for input(s): HGBA1C in the last 72 hours. CBG: Recent Labs  Lab 11/23/18 1432 11/23/18 2133 11/24/18 0747 11/24/18 1219 11/24/18 1618  GLUCAP 100* 103* 114* 186* 178*   Lipid Profile: No results for input(s): CHOL, HDL, LDLCALC, TRIG, CHOLHDL, LDLDIRECT in the last 72 hours. Thyroid Function Tests: No results for input(s): TSH, T4TOTAL, FREET4, T3FREE, THYROIDAB in the last 72 hours. Anemia Panel: No results for input(s): VITAMINB12, FOLATE, FERRITIN, TIBC, IRON, RETICCTPCT in the last 72 hours. Urine analysis:    Component Value Date/Time   COLORURINE YELLOW 08/23/2018 1939   APPEARANCEUR CLEAR 08/23/2018 1939   LABSPEC 1.010 08/23/2018 1939   PHURINE 5.0 08/23/2018 1939   GLUCOSEU 50 (A) 08/23/2018 1939   HGBUR NEGATIVE 08/23/2018 [place] NEGATIVE 08/23/2018 1939   [place] negaitive 05/13/2016 [place] 08/23/2018 [place] NEGATIVE 08/23/2018 1939  UROBILINOGEN 0.2 05/13/2016 1748   UROBILINOGEN 1.0 03/31/2015 0117   NITRITE NEGATIVE 08/23/2018 1939   LEUKOCYTESUR NEGATIVE 08/23/2018 1939   Sepsis Labs: @LABRCNTIP (procalcitonin:4,lacticidven:4)  ) Recent Results (from the past 240 hour(s))  Respiratory Panel by PCR     Status: None   Collection Time: 11/23/18  6:36 PM  Result Value Ref Range Status   Adenovirus NOT DETECTED NOT DETECTED Final   Coronavirus 229E NOT DETECTED NOT DETECTED Final    Comment: (NOTE) The Coronavirus on the Respiratory Panel, DOES NOT test for the novel  Coronavirus (2019 nCoV)    Coronavirus HKU1 NOT DETECTED NOT DETECTED Final   Coronavirus NL63 NOT DETECTED NOT DETECTED Final   Coronavirus OC43 NOT DETECTED NOT DETECTED Final   Metapneumovirus  NOT DETECTED NOT DETECTED Final   Rhinovirus / Enterovirus NOT DETECTED NOT DETECTED Final   Influenza A NOT DETECTED NOT DETECTED Final   Influenza B NOT DETECTED NOT DETECTED Final   Parainfluenza Virus 1 NOT DETECTED NOT DETECTED Final   Parainfluenza Virus 2 NOT DETECTED NOT DETECTED Final   Parainfluenza Virus 3 NOT DETECTED NOT DETECTED Final   Parainfluenza Virus 4 NOT DETECTED NOT DETECTED Final   Respiratory Syncytial Virus NOT DETECTED NOT DETECTED Final   Bordetella pertussis NOT DETECTED NOT DETECTED Final   Chlamydophila pneumoniae NOT DETECTED NOT DETECTED Final   Mycoplasma pneumoniae NOT DETECTED NOT DETECTED Final    Comment: Performed at [facility], [place] [address], [place], [place] [address]      Studies: No results found.  Scheduled Meds: . atorvastatin  20 mg Oral q1800  . carvedilol  25 mg Oral BID WC  . dextrose  25 g Intravenous STAT  . feeding supplement (ENSURE ENLIVE)  237 mL Oral BID BM  . fluconazole  100 mg Oral Daily  . furosemide  80 mg Oral BID  . guaiFENesin  1,200 mg Oral BID  . insulin aspart  0-15 Units Subcutaneous TID WC  . insulin aspart  0-5 Units Subcutaneous QHS  . ipratropium-albuterol  3 mL Nebulization BID  . mouth rinse  15 mL Mouth Rinse BID  . Melatonin  1 tablet Oral QHS  . pantoprazole  40 mg Oral Daily  . rivaroxaban  20 mg Oral Q supper    Continuous Infusions: . azithromycin 500 mg (11/23/18 2109)  . cefTRIAXone (ROCEPHIN)  IV 1 g (11/24/18 1743)     LOS: 1 day     Kayleen Memos, MD [facility] Pager [phone]  If 7PM-7AM, please contact night-coverage [email or web address] Password [facility] 11/24/2018, 6:04 PM

## 2018-11-25 LAB — GLUCOSE, CAPILLARY
GLUCOSE-CAPILLARY: 231 mg/dL — AB (ref 70–99)
Glucose-Capillary: 121 mg/dL — ABNORMAL HIGH (ref 70–99)
Glucose-Capillary: 121 mg/dL — ABNORMAL HIGH (ref 70–99)
Glucose-Capillary: 229 mg/dL — ABNORMAL HIGH (ref 70–99)
Glucose-Capillary: 317 mg/dL — ABNORMAL HIGH (ref 70–99)

## 2018-11-25 MED ORDER — METHYLPREDNISOLONE SODIUM SUCC 40 MG IJ SOLR
40.0000 mg | Freq: Three times a day (TID) | INTRAMUSCULAR | Status: DC
  Administered 2018-11-25 – 2018-11-26 (×5): 40 mg via INTRAVENOUS
  Filled 2018-11-25 (×5): qty 1

## 2018-11-25 NOTE — Care Management Note (Signed)
Case Management Note  Patient Details  Name: Eric Price MRN: [id number] Date of Birth: 1951-07-10  Subjective/Objective:                  Discharge Readiness Return to top of Pneumonia RRG - [place]  Discharge readiness is indicated by patient meeting Recovery Milestones, including ALL of the following: ? Hemodynamic stability   yes ? Tachypnea absent  RR=22 ON ROOM AIR ? Hypoxemia absent 98% SAT ON ROOM AIR ? Afebrile, or temperature acceptable for next level of care TEMP 99.4 ? Oxygen absent or at baseline need ON ROOM AIR AS OF PM OF 022120 ? Mental status at baseline CLEAR ? Antibiotic regimen acceptable for next level of care  ? NO IV ZITHROMAX AND ROCEPHIN  ? Ambulatory UP IN ROOM ? Oral hydration, medications, and diet  ? SEE ABOVE ? NEEDS TO FINISH IV ABX REGIME    Action/Plan: ?    Expected Discharge Date:  (unknown)               Expected Discharge Plan:  [place]  In-House Referral:     Discharge planning Services  CM Consult  Post Acute Care Choice:    Choice offered to:  Patient  DME Arranged:    DME Agency:     HH Arranged:  PT, RN [place] Agency:  [facility]  Status of Service:  Completed, signed off  If discussed at [place] of Stay Meetings, dates discussed:    Additional Comments: Will follow for progression of care and clinical status. Will follow for case management needs none present at this time. Leeroy Cha, RN 11/25/2018, 2:53 PM

## 2018-11-25 NOTE — Progress Notes (Signed)
Physical Therapy Treatment Patient Details Name: Eric Price MRN: [id number] DOB: November 07, 1950 Today's Date: 11/25/2018    SATURATION QUALIFICATIONS: (This note is used to comply with regulatory documentation for home oxygen)  Patient Saturations on Room Air at Rest = 88%  Patient Saturations on [employer] while Ambulating = n/a  Patient Saturations on 3 Liters of oxygen while Ambulating = 84%    History of Present Illness 68 yo male admitted with Pna. Hx of L BKA-wears prosthesis, CHF, PVD, DM, neuropahty, CAD    PT Comments    Progressing with mobility.  O2 sat 94% on 3L [place] at rest/end of session  Follow Up Recommendations  Home health PT     Equipment Recommendations  None recommended by PT    Recommendations for Other Services       Precautions / Restrictions Precautions Precautions: Fall Precaution Comments: monitor O2 Restrictions Weight Bearing Restrictions: No    Mobility  Bed Mobility Overal bed mobility: Modified Independent             General bed mobility comments: Increased time.   Transfers Overall transfer level: Needs assistance Equipment used: Rolling walker (2 wheeled) Transfers: Sit to/from Stand Sit to Stand: Min guard;From elevated surface         General transfer comment: Close guard for safety. VCs safety, hand placement.   Ambulation/Gait Ambulation/Gait assistance: Min guard Gait Distance (Feet): 200 Feet Assistive device: Rolling walker (2 wheeled) Gait Pattern/deviations: Step-through pattern;Decreased stride length     General Gait Details: Close guard for safety. O2 sat level dropped to 83% on 3L [place] during ambulation. Dyspnea 2/4. 2 standing rest breaks during walk to allow sat level to recover to at least 88% before continuing ambulation.    Stairs             Wheelchair Mobility    Modified Rankin (Stroke Patients Only)       Balance Overall balance assessment: Needs assistance         Standing  balance support: Bilateral upper extremity supported Standing balance-Leahy Scale: Poor                              Cognition Arousal/Alertness: Awake/alert Behavior During Therapy: WFL for tasks assessed/performed Overall Cognitive Status: Within Functional Limits for tasks assessed                                        Exercises      General Comments        Pertinent Vitals/Pain Pain Assessment: No/denies pain    Home Living                      Prior Function            PT Goals (current goals can now be found in the care plan section) Progress towards PT goals: Progressing toward goals    Frequency    Min 3X/week      PT Plan Current plan remains appropriate    Co-evaluation              AM-PAC PT "6 Clicks" Mobility   Outcome Measure  Help needed turning from your back to your side while in a flat bed without using bedrails?: A Little Help needed moving from lying on your back to sitting on the  side of a flat bed without using bedrails?: A Little Help needed moving to and from a bed to a chair (including a wheelchair)?: A Little Help needed standing up from a chair using your arms (e.g., wheelchair or bedside chair)?: A Little Help needed to walk in hospital room?: A Little Help needed climbing 3-5 steps with a railing? : A Lot 6 Click Score: 17    End of Session Equipment Utilized During Treatment: Gait belt;Oxygen Activity Tolerance: Patient tolerated treatment well Patient left: in chair;with call bell/phone within reach   PT Visit Diagnosis: Difficulty in walking, not elsewhere classified (R26.2)     Time: 3888-7579 PT Time Calculation (min) (ACUTE ONLY): 28 min  Charges:  $Gait Training: 23-37 mins                        Weston [name], PT Acute Rehabilitation Services Pager: [phone] Office: [phone]

## 2018-11-25 NOTE — Care Management Important Message (Signed)
Important Message  Patient Details  Name: Eric Price MRN: [id number] Date of Birth: November 21, 1950   Medicare Important Message Given:  Yes    Kerin Salen 11/25/2018, 3:14 PMImportant Message  Patient Details  Name: Eric Price MRN: [id number] Date of Birth: [birth year]   Medicare Important Message Given:  Yes    Kerin Salen 11/25/2018, 3:14 PM

## 2018-11-25 NOTE — Progress Notes (Signed)
PROGRESS NOTE  Eric Price ZOX:[id number] DOB: [birth year] DOA: 11/23/2018 PCP: [name], MD  HPI/Recap of past 24 hours: Eric Price is a 68 y.o. male with medical history significant for hypertension, nonischemic cardiomyopathy, systolic CHF, peripheral vascular disease status post left below the knee amputation, type 2 diabetes, diabetic neuropathy, mycosis fungoides treated at [facility], who presented to [facility] ED with complaints of a fever and 2 weeks duration of associated gradually worsening shortness of breath and productive cough.  Symptoms are also present at rest.  Denies any chest pain.  Denies any sick contacts.  No recent lengthy trips.  No lower extremity edema.  No use of tobacco or alcohol. Last chemotherapy for his mycosis fungoides was 4 weeks ago at [facility]. Per his wife he had a fever of 100.6 at home prior to presenting to the ED, called [facility] and was instructed to come to the nearest ED.   11/24/2018: Patient seen and examined at bedside.  No acute events overnight.  Stable dyspnea with minimal exertion and persistent cough.  Influenza a and B, viral panel negative.  Procalcitonin unremarkable.  Evaluated by PT with recommendations for home health PT.  11/25/18: Persistent hypoxia with minimal exertion.  Failed home O2 evaluation.  Requiring 3 L of of oxygen or more to maintain O2 saturation greater than 84%.  Assessment/Plan: Active Problems:   CAP (community acquired pneumonia)  Community acquired pneumonia, present admission Presented with 2 weeks duration of productive cough and shortness of breath Per his wife he had a fever of 100.6 at home prior to presenting to the ED, called [facility] and was instructed to come to the nearest ED. Independently reviewed CT chest which showed infiltrates noted on left side, suggestive of pneumonia Continue IV azithromycin and IV Rocephin Negative procalcitonin in the morning Continue duo nebs twice daily and  Mucinex 1200 mg twice daily Negative influenza A&B Negative respiratory viral panel  Persistent acute hypoxic respiratory failure secondary to community-acquired pneumonia Presented with O2 saturation in the upper 80s Not on O2 supplementation at baseline Maintain O2 saturation greater than 92% Continue duo nebs twice daily and as needed Mucinex 1200 mg twice daily Failed home O2 evaluation Requiring at least 3 L oxygen by nasal cannula to maintain O2 saturation greater than 84% Add Solu-Medrol 40 mg 3 times daily  Resolving AKI Appears to be at his baseline creatinine 1.0 with GFR greater than 60 Patient presented with creatinine of 1.31 Continue to avoid nephrotoxic agents Continue to monitor urine output Obtain BMP in the morning  History of mycosis fungoides Seen by heme oncology at [facility] Completed first round of chemotherapy 4 weeks ago  Next round of chemotherapy will begin this next Wednesday Follow-up outpatient  Euvolemic hyponatremia Sodium 132 Unclear etiology Repeat BMP in the morning  CKD 2 Appears to be at his baseline creatinine of 1.3 with GFR greater than 60 Avoid nephrotoxic agents Monitor urine output Repeat BMP in the morning  Chronic systolic CHF Last 2D echo revealed LVEF 40 to 45% Continue strict I's and O's and daily weight Continue cardiac medications Monitor volume status Continue Lasix  Type 2 diabetes Hemoglobin A1c 5.9 on 11/03/2018 Continue insulin sliding scale  Hyperlipidemia  Continue Lipitor  Hypertension Continue Coreg, Lasix  Diabetic neuropathy Continue Lyrica  History of lower extremity embolism and thrombosis Continue Xarelto  Peripheral vascular disease status post left lower extremity below-the-knee amputation On Xarelto and Lipitor      DVT prophylaxis: Xarelto  Code Status: Full code  Family Communication: Wife at bedside.  All questions answered to her  satisfaction.  Disposition Plan:  Possible discharge to home with home health PT tomorrow 11/25/2018  Consults called: None     Objective: Vitals:   11/25/18 0511 11/25/18 0533 11/25/18 0833 11/25/18 1411  BP: (!) 114/59   118/71  Pulse: (!) 110 (!) 102 98 (!) 105  Resp: (!) 22 20 18  (!) 22  Temp: 99.4 F (37.4 C)   99.2 F (37.3 C)  TempSrc: Oral   Oral  SpO2: 97%  99% 98%  Weight:      Height:        Intake/Output Summary (Last 24 hours) at 11/25/2018 1553 Last data filed at 11/25/2018 0900 Gross per 24 hour  Intake 830 ml  Output 1600 ml  Net -770 ml   Filed Weights   11/23/18 1248 11/23/18 2025 11/24/18 0444  Weight: 97.5 kg 91.9 kg 90.2 kg    Exam:  . General: 68 y.o. year-old male well-developed well-nourished in no acute distress.  Alert and oriented x3. . Cardiovascular: Regular rate and rhythm with no rubs or gallops.  No JVD or thyromegaly . Respiratory: Mild rales at bases and mild diffuse wheezes.  Poor inspiratory effort. . Abdomen: Soft nontender nondistended with normal bowel sounds x4 quadrants. . Musculoskeletal: No lower extremity edema. 2/4 pulses in all 4 extremities.  Left below the knee amputation noted. Marland Kitchen Psychiatry: Mood is appropriate for condition and setting   Data Reviewed: CBC: Recent Labs  Lab 11/23/18 1318 11/24/18 0645  WBC 7.1 6.9  NEUTROABS 4.3 4.0  HGB 10.6* 10.6*  HCT 32.8* 33.9*  MCV 83.5 85.4  PLT 255 062   Basic Metabolic Panel: Recent Labs  Lab 11/23/18 1318 11/24/18 0645  NA 132* 134*  K 4.3 4.4  CL 101 102  CO2 21* 24  GLUCOSE 126* 107*  BUN 18 13  CREATININE 1.31* 1.09  CALCIUM 8.9 9.1  MG  --  1.8   GFR: Estimated Creatinine Clearance: 72 mL/min (by C-G formula based on SCr of 1.09 mg/dL). Liver Function Tests: Recent Labs  Lab 11/23/18 1318  AST 33  ALT 20  ALKPHOS 114  BILITOT 0.5  PROT 6.0*  ALBUMIN 3.3*   No results for input(s): LIPASE, AMYLASE in the last 168 hours. No results for  input(s): AMMONIA in the last 168 hours. Coagulation Profile: No results for input(s): INR, PROTIME in the last 168 hours. Cardiac Enzymes: Recent Labs  Lab 11/23/18 1318  TROPONINI <0.03   BNP (last 3 results) Recent Labs    06/15/18 1644  PROBNP 17.0   HbA1C: No results for input(s): HGBA1C in the last 72 hours. CBG: Recent Labs  Lab 11/24/18 1219 11/24/18 1618 11/24/18 2055 11/25/18 0740 11/25/18 1234  GLUCAP 186* 178* 114* 121* 231*   Lipid Profile: No results for input(s): CHOL, HDL, LDLCALC, TRIG, CHOLHDL, LDLDIRECT in the last 72 hours. Thyroid Function Tests: No results for input(s): TSH, T4TOTAL, FREET4, T3FREE, THYROIDAB in the last 72 hours. Anemia Panel: No results for input(s): VITAMINB12, FOLATE, FERRITIN, TIBC, IRON, RETICCTPCT in the last 72 hours. Urine analysis:    Component Value Date/Time   COLORURINE YELLOW 08/23/2018 1939   APPEARANCEUR CLEAR 08/23/2018 1939   LABSPEC 1.010 08/23/2018 1939   PHURINE 5.0 08/23/2018 1939   GLUCOSEU 50 (A) 08/23/2018 [place] NEGATIVE 08/23/2018 [place] NEGATIVE 08/23/2018 1939   [place] negaitive 05/13/2016 1748   KETONESUR  NEGATIVE 08/23/2018 1939   PROTEINUR NEGATIVE 08/23/2018 1939   UROBILINOGEN 0.2 05/13/2016 1748   UROBILINOGEN 1.0 03/31/2015 0117   NITRITE NEGATIVE 08/23/2018 1939   LEUKOCYTESUR NEGATIVE 08/23/2018 1939   Sepsis Labs: @LABRCNTIP (procalcitonin:4,lacticidven:4)  ) Recent Results (from the past 240 hour(s))  Respiratory Panel by PCR     Status: None   Collection Time: 11/23/18  6:36 PM  Result Value Ref Range Status   Adenovirus NOT DETECTED NOT DETECTED Final   Coronavirus 229E NOT DETECTED NOT DETECTED Final    Comment: (NOTE) The Coronavirus on the Respiratory Panel, DOES NOT test for the novel  Coronavirus (2019 nCoV)    Coronavirus HKU1 NOT DETECTED NOT DETECTED Final   Coronavirus NL63 NOT DETECTED NOT DETECTED Final   Coronavirus OC43 NOT DETECTED NOT  DETECTED Final   Metapneumovirus NOT DETECTED NOT DETECTED Final   Rhinovirus / Enterovirus NOT DETECTED NOT DETECTED Final   Influenza A NOT DETECTED NOT DETECTED Final   Influenza B NOT DETECTED NOT DETECTED Final   Parainfluenza Virus 1 NOT DETECTED NOT DETECTED Final   Parainfluenza Virus 2 NOT DETECTED NOT DETECTED Final   Parainfluenza Virus 3 NOT DETECTED NOT DETECTED Final   Parainfluenza Virus 4 NOT DETECTED NOT DETECTED Final   Respiratory Syncytial Virus NOT DETECTED NOT DETECTED Final   Bordetella pertussis NOT DETECTED NOT DETECTED Final   Chlamydophila pneumoniae NOT DETECTED NOT DETECTED Final   Mycoplasma pneumoniae NOT DETECTED NOT DETECTED Final    Comment: Performed at [facility], [place]. [address], [place], [place] [address]      Studies: No results found.  Scheduled Meds: . atorvastatin  20 mg Oral q1800  . carvedilol  25 mg Oral BID WC  . feeding supplement (ENSURE ENLIVE)  237 mL Oral BID BM  . fluconazole  100 mg Oral Daily  . furosemide  80 mg Oral BID  . guaiFENesin  1,200 mg Oral BID  . insulin aspart  0-15 Units Subcutaneous TID WC  . insulin aspart  0-5 Units Subcutaneous QHS  . ipratropium-albuterol  3 mL Nebulization BID  . mouth rinse  15 mL Mouth Rinse BID  . Melatonin  1 tablet Oral QHS  . pantoprazole  40 mg Oral Daily  . rivaroxaban  20 mg Oral Q supper    Continuous Infusions: . azithromycin 500 mg (11/24/18 1830)  . cefTRIAXone (ROCEPHIN)  IV 1 g (11/24/18 1743)     LOS: 2 days     Kayleen Memos, MD [facility] Pager [phone]  If 7PM-7AM, please contact night-coverage [email or web address] Password [facility] 11/25/2018, 3:53 PM

## 2018-11-25 NOTE — Progress Notes (Signed)
SATURATION QUALIFICATIONS: (This note is used to comply with regulatory documentation for home oxygen)  Patient Saturations on Room Air at Rest = 90%  Patient Saturations on Room Air while Ambulating = 83%  Patient Saturations on 3 Liters of oxygen while Ambulating = 95%  Please briefly explain why patient needs home oxygen: Patient requires home oxygen use due to oxygen desaturation on room air.

## 2018-11-25 NOTE — Progress Notes (Signed)
   11/25/18 0511  MEWS Score  Resp (!) 22  Pulse Rate (!) 110  BP (!) 114/59  Temp 99.4 F (37.4 C)  SpO2 97 %  O2 Device Room Air  MEWS Score  MEWS RR 1  MEWS Pulse 1  MEWS Systolic 0  MEWS LOC 0  MEWS Temp 0  MEWS Score 2  MEWS Score Color Yellow  MEWS Assessment  Is this an acute change? No (RN reassessment completed after VS)   Pt has been tachycardic most of his admission ranging from 100-120s. RN reassessed and pulse is 102 radially and his respiration rate is 20. Pt was moving in bed during first VS with NT and became SOB with movement but resolved with rest.

## 2018-11-25 NOTE — Care Management Note (Signed)
Case Management Note  Patient Details  Name: Eric Price MRN: [id number] Date of Birth: 09-Dec-1950  Subjective/Objective:                  discharged Action/Plan: [facility]hc rn and pt -[facility] Expected Discharge Date:  (unknown)               Expected Discharge Plan:  [place]  In-House Referral:     Discharge planning Services  CM Consult  Post Acute Care Choice:    Choice offered to:  Patient  DME Arranged:    DME Agency:     HH Arranged:  PT, RN [place] Agency:  [facility]  Status of Service:  Completed, signed off  If discussed at [place] of Stay Meetings, dates discussed:    Additional Comments:  Leeroy Cha, RN 11/25/2018, 12:00 PM

## 2018-11-26 LAB — GLUCOSE, CAPILLARY
GLUCOSE-CAPILLARY: 249 mg/dL — AB (ref 70–99)
GLUCOSE-CAPILLARY: 323 mg/dL — AB (ref 70–99)
Glucose-Capillary: 366 mg/dL — ABNORMAL HIGH (ref 70–99)
Glucose-Capillary: 336 mg/dL — ABNORMAL HIGH (ref 70–99)

## 2018-11-26 LAB — BASIC METABOLIC PANEL
ANION GAP: 10 (ref 5–15)
BUN: 23 mg/dL (ref 8–23)
CO2: 21 mmol/L — ABNORMAL LOW (ref 22–32)
Calcium: 9.6 mg/dL (ref 8.9–10.3)
Chloride: 103 mmol/L (ref 98–111)
Creatinine, Ser: 1.08 mg/dL (ref 0.61–1.24)
GFR calc Af Amer: >60 mL/min (ref >60)
GFR calc non Af Amer: >60 mL/min (ref >60)
Glucose, Bld: 260 mg/dL — ABNORMAL HIGH (ref 70–99)
Potassium: 4.8 mmol/L (ref 3.5–5.1)
Sodium: 134 mmol/L — ABNORMAL LOW (ref 135–145)

## 2018-11-26 LAB — PHOSPHORUS: Phosphorus: 5.2 mg/dL — ABNORMAL HIGH (ref 2.5–4.6)

## 2018-11-26 LAB — MAGNESIUM: Magnesium: 2 mg/dL (ref 1.7–2.4)

## 2018-11-26 MED ORDER — INSULIN DETEMIR 100 UNIT/ML FLEXPEN: 24.0000 [IU] | PEN_INJECTOR | Freq: Two times a day (BID) | SUBCUTANEOUS | Status: DC

## 2018-11-26 MED ORDER — INSULIN ASPART 100 UNIT/ML ~~LOC~~ SOLN
8.0000 [IU] | Freq: Three times a day (TID) | SUBCUTANEOUS | Status: DC
  Administered 2018-11-26 – 2018-11-27 (×3): 8 [IU] via SUBCUTANEOUS

## 2018-11-26 MED ORDER — INSULIN DETEMIR 100 UNIT/ML ~~LOC~~ SOLN
10.0000 [IU] | Freq: Two times a day (BID) | SUBCUTANEOUS | Status: DC
  Administered 2018-11-26 – 2018-11-27 (×2): 10 [IU] via SUBCUTANEOUS
  Filled 2018-11-26 (×3): qty 0.1

## 2018-11-26 MED ORDER — AZITHROMYCIN 250 MG PO TABS
500.0000 mg | ORAL_TABLET | Freq: Every day | ORAL | Status: DC
  Administered 2018-11-26: 500 mg via ORAL
  Filled 2018-11-26: qty 2

## 2018-11-26 MED ORDER — INSULIN ASPART 100 UNIT/ML FLEXPEN: 20.0000 [IU] | PEN_INJECTOR | Freq: Three times a day (TID) | SUBCUTANEOUS | Status: DC

## 2018-11-26 NOTE — Progress Notes (Signed)
PHARMACIST - PHYSICIAN COMMUNICATION DR:   [place] Crane CONCERNING: Antibiotic IV to Oral Route Change Policy  RECOMMENDATION: This patient is receiving Zithromax by the intravenous route.  Based on criteria approved by the Pharmacy and Therapeutics Committee, the antibiotic(s) is/are being converted to the equivalent oral dose form(s).   DESCRIPTION: These criteria include:  Patient being treated for a respiratory tract infection, urinary tract infection, cellulitis or clostridium difficile associated diarrhea if on metronidazole  The patient is not neutropenic and does not exhibit a GI malabsorption state  The patient is eating (either orally or via tube) and/or has been taking other orally administered medications for a least 24 hours  The patient is improving clinically and has a Tmax < 100.5  If you have questions about this conversion, please contact the Pharmacy Department  []   [phone] )  [facility] []   [phone] )  [facility] []   [phone] )  [facility] []   [phone] )  [facility] [x]   [phone] )  [place], PharmD, BCPS 11/26/2018@10 :44 AM

## 2018-11-26 NOTE — Progress Notes (Addendum)
PROGRESS NOTE  [name] NTI:[id number] DOB: 06/22/1951 DOA: 11/23/2018 PCP: [name], MD  HPI/Recap of past 24 hours: Eric Price is a 68 y.o. male with medical history significant for hypertension, nonischemic cardiomyopathy, systolic CHF, peripheral vascular disease status post left below the knee amputation, type 2 diabetes, diabetic neuropathy, mycosis fungoides treated at [facility], who presented to [facility] ED with complaints of a fever and 2 weeks duration of associated gradually worsening shortness of breath and productive cough.  Symptoms are also present at rest.  Denies any chest pain.  Denies any sick contacts.  No recent lengthy trips.  No lower extremity edema.  No use of tobacco or alcohol. Last chemotherapy for his mycosis fungoides was 4 weeks ago at [facility]. Per his wife he had a fever of 100.6 at home prior to presenting to the ED, called [name] and was instructed to come to the nearest ED.   11/26/18: Seen and examined at his bedside.  Denies chest pain, palpitation or dyspnea at rest.  Has been tachycardic with persistent hypoxia.  History of tachycardia on Coreg and history of hypercoagulable state on Xarelto.  Assessment/Plan: Active Problems:   CAP (community acquired pneumonia)  Community acquired pneumonia, present admission Presented with 2 weeks duration of productive cough and shortness of breath Per his wife he had a fever of 100.6 at home prior to presenting to the ED, called [name] and was instructed to come to the nearest ED. Independently reviewed CT chest which showed infiltrates noted on left side, suggestive of pneumonia Continue p.o. azithromycin and IV Rocephin Negative procalcitonin  Continue duo nebs twice daily and Mucinex 1200 mg twice daily Negative influenza A&B Negative respiratory viral panel Switch to oral antibiotics in the morning  Persistent acute hypoxic respiratory failure secondary to community-acquired  pneumonia Presented with O2 saturation in the upper 80s Not on O2 supplementation at baseline Continue duo nebs twice daily and as needed Mucinex 1200 mg twice daily Continue IV Solu-Medrol Maintain O2 saturation greater than 92%  Sinus tachycardia History of sinus tachycardia Followed by Dr. Gwenlyn Found Independent reviewed twelve-lead EKG done on 11/25/2018 which revealed sinus tachycardia with no specific ST-T changes Denies chest pain Continue Coreg  Resolving AKI Appears to be at his baseline creatinine 1.0 with GFR greater than 60 Patient presented with creatinine of 1.31 Continue to avoid nephrotoxic agents Continue to monitor urine output Obtain BMP in the morning  Steroid-induced hyperglycemia in the setting of insulin-dependent type 2 diabetes Last hemoglobin A1c 5.9 on 11/03/2018 Resume Levemir 10 unit twice daily Resume NovoLog 8 units 3 times daily Continue insulin sliding scale DC Solu-Medrol tomorrow and start p.o. prednisone on 11/27/2018 Continue to closely monitor  History of mycosis fungoides Seen by heme oncology at [facility] Completed first round of chemotherapy 4 weeks ago  Next round of chemotherapy will begin this next Wednesday Follow-up outpatient  Resolving euvolemic hyponatremia Sodium 134 from 132 Possibly from diuretics  CKD 2 Appears to be at his baseline creatinine of 1.3 with GFR greater than 60 Avoid nephrotoxic agents Monitor urine output Repeat BMP in the morning  Chronic systolic CHF Last 2D echo revealed LVEF 40 to 45% Continue strict I's and O's and daily weight Continue cardiac medications Monitor volume status Continue Lasix Net I&O -3.8 L since admission  Hyperlipidemia  Continue Lipitor  Hypertension Continue Coreg, Lasix  Diabetic neuropathy Continue Lyrica  History of lower extremity embolism and thrombosis/history of hypercoagulable state Continue Xarelto  Peripheral  vascular disease status post left  lower extremity below-the-knee amputation On Xarelto and Lipitor      DVT prophylaxis: Xarelto  Code Status: Full code  Family Communication: Wife at bedside.  All questions answered to her satisfaction.  Disposition Plan:  Possible discharge to home with home health PT tomorrow 11/27/2018  Consults called: None     Objective: Vitals:   11/26/18 1310 11/26/18 1315 11/26/18 1400 11/26/18 1455  BP:   114/69   Pulse:   (!) 101 (!) 106  Resp:    (!) 22  Temp:   97.6 F (36.4 C)   TempSrc:   Oral   SpO2: (!) 88% 93% 97%   Weight:      Height:        Intake/Output Summary (Last 24 hours) at 11/26/2018 1737 Last data filed at 11/26/2018 1526 Gross per 24 hour  Intake 240 ml  Output 2650 ml  Net -2410 ml   Filed Weights   11/23/18 2025 11/24/18 0444 11/26/18 0521  Weight: 91.9 kg 90.2 kg 87.4 kg    Exam:  . General: 68 y.o. year-old male well-developed well-nourished in no acute distress.  Alert and oriented x3. . Cardiovascular: Regular rate and rhythm with no rubs or gallops.  No JVD or thyromegaly . Respiratory: Mild rales at bases with no wheezes.  Poor inspiratory effort.   . Abdomen: Soft nontender nondistended with normal bowel sounds x4 quadrants. . Musculoskeletal: No lower extremity edema. 2/4 pulses in all 4 extremities.  Left below the knee amputation noted. Marland Kitchen Psychiatry: Mood is appropriate for condition and setting   Data Reviewed: CBC: Recent Labs  Lab 11/23/18 1318 11/24/18 0645  WBC 7.1 6.9  NEUTROABS 4.3 4.0  HGB 10.6* 10.6*  HCT 32.8* 33.9*  MCV 83.5 85.4  PLT 255 939   Basic Metabolic Panel: Recent Labs  Lab 11/23/18 1318 11/24/18 0645 11/26/18 0536  NA 132* 134* 134*  K 4.3 4.4 4.8  CL 101 102 103  CO2 21* 24 21*  GLUCOSE 126* 107* 260*  BUN 18 13 23   CREATININE 1.31* 1.09 1.08  CALCIUM 8.9 9.1 9.6  MG  --  1.8 2.0  PHOS  --   --  5.2*   GFR: Estimated Creatinine Clearance: 71.7 mL/min (by C-G formula based on  SCr of 1.08 mg/dL). Liver Function Tests: Recent Labs  Lab 11/23/18 1318  AST 33  ALT 20  ALKPHOS 114  BILITOT 0.5  PROT 6.0*  ALBUMIN 3.3*   No results for input(s): LIPASE, AMYLASE in the last 168 hours. No results for input(s): AMMONIA in the last 168 hours. Coagulation Profile: No results for input(s): INR, PROTIME in the last 168 hours. Cardiac Enzymes: Recent Labs  Lab 11/23/18 1318  TROPONINI <0.03   BNP (last 3 results) Recent Labs    06/15/18 1644  PROBNP 17.0   HbA1C: No results for input(s): HGBA1C in the last 72 hours. CBG: Recent Labs  Lab 11/25/18 1913 11/25/18 2046 11/26/18 0732 11/26/18 1126 11/26/18 1638  GLUCAP 229* 317* 249* 323* 366*   Lipid Profile: No results for input(s): CHOL, HDL, LDLCALC, TRIG, CHOLHDL, LDLDIRECT in the last 72 hours. Thyroid Function Tests: No results for input(s): TSH, T4TOTAL, FREET4, T3FREE, THYROIDAB in the last 72 hours. Anemia Panel: No results for input(s): VITAMINB12, FOLATE, FERRITIN, TIBC, IRON, RETICCTPCT in the last 72 hours. Urine analysis:    Component Value Date/Time   COLORURINE YELLOW 08/23/2018 1939   APPEARANCEUR CLEAR 08/23/2018 1939  LABSPEC 1.010 08/23/2018 1939   PHURINE 5.0 08/23/2018 1939   GLUCOSEU 50 (A) 08/23/2018 1939   HGBUR NEGATIVE 08/23/2018 [place] NEGATIVE 08/23/2018 1939   [place] negaitive 05/13/2016 1748   KETONESUR NEGATIVE 08/23/2018 1939   PROTEINUR NEGATIVE 08/23/2018 1939   UROBILINOGEN 0.2 05/13/2016 1748   UROBILINOGEN 1.0 03/31/2015 0117   NITRITE NEGATIVE 08/23/2018 1939   LEUKOCYTESUR NEGATIVE 08/23/2018 1939   Sepsis Labs: @LABRCNTIP (procalcitonin:4,lacticidven:4)  ) Recent Results (from the past 240 hour(s))  Respiratory Panel by PCR     Status: None   Collection Time: 11/23/18  6:36 PM  Result Value Ref Range Status   Adenovirus NOT DETECTED NOT DETECTED Final   Coronavirus 229E NOT DETECTED NOT DETECTED Final    Comment: (NOTE) The  Coronavirus on the Respiratory Panel, DOES NOT test for the novel  Coronavirus (2019 nCoV)    Coronavirus HKU1 NOT DETECTED NOT DETECTED Final   Coronavirus NL63 NOT DETECTED NOT DETECTED Final   Coronavirus OC43 NOT DETECTED NOT DETECTED Final   Metapneumovirus NOT DETECTED NOT DETECTED Final   Rhinovirus / Enterovirus NOT DETECTED NOT DETECTED Final   Influenza A NOT DETECTED NOT DETECTED Final   Influenza B NOT DETECTED NOT DETECTED Final   Parainfluenza Virus 1 NOT DETECTED NOT DETECTED Final   Parainfluenza Virus 2 NOT DETECTED NOT DETECTED Final   Parainfluenza Virus 3 NOT DETECTED NOT DETECTED Final   Parainfluenza Virus 4 NOT DETECTED NOT DETECTED Final   Respiratory Syncytial Virus NOT DETECTED NOT DETECTED Final   Bordetella pertussis NOT DETECTED NOT DETECTED Final   Chlamydophila pneumoniae NOT DETECTED NOT DETECTED Final   Mycoplasma pneumoniae NOT DETECTED NOT DETECTED Final    Comment: Performed at [facility], [place] [address] [place], [place] [address]      Studies: No results found.  Scheduled Meds: . atorvastatin  20 mg Oral q1800  . azithromycin  500 mg Oral q1800  . carvedilol  25 mg Oral BID WC  . feeding supplement (ENSURE ENLIVE)  237 mL Oral BID BM  . fluconazole  100 mg Oral Daily  . furosemide  80 mg Oral BID  . guaiFENesin  1,200 mg Oral BID  . insulin aspart  0-15 Units Subcutaneous TID WC  . insulin aspart  0-5 Units Subcutaneous QHS  . ipratropium-albuterol  3 mL Nebulization BID  . mouth rinse  15 mL Mouth Rinse BID  . Melatonin  1 tablet Oral QHS  . methylPREDNISolone (SOLU-MEDROL) injection  40 mg Intravenous TID  . pantoprazole  40 mg Oral Daily  . rivaroxaban  20 mg Oral Q supper    Continuous Infusions: . cefTRIAXone (ROCEPHIN)  IV 1 g (11/26/18 1719)     LOS: 3 days     Kayleen Memos, MD [facility] Pager [phone]  If 7PM-7AM, please contact night-coverage [email or web address] Password [facility] 11/26/2018, 5:37 PM

## 2018-11-26 NOTE — Progress Notes (Signed)
Pt remains tachy since admission. Respirations are chronically high due to pt's diagnoses. MD aware. Pt resting quietly in bed with spouse at side. Pt continues to refuse Lasix.

## 2018-11-27 LAB — GLUCOSE, CAPILLARY
Glucose-Capillary: 262 mg/dL — ABNORMAL HIGH (ref 70–99)
Glucose-Capillary: 314 mg/dL — ABNORMAL HIGH (ref 70–99)

## 2018-11-27 MED ORDER — BENZONATATE 100 MG PO CAPS: 200.0000 mg | ORAL_CAPSULE | Freq: Two times a day (BID) | ORAL | Status: DC | PRN

## 2018-11-27 MED ORDER — ALBUTEROL SULFATE (2.5 MG/3ML) 0.083% IN NEBU: 2.5000 mg | INHALATION_SOLUTION | Freq: Two times a day (BID) | RESPIRATORY_TRACT | 0 refills | Status: DC | PRN

## 2018-11-27 MED ORDER — PREDNISONE 20 MG PO TABS
20.0000 mg | ORAL_TABLET | Freq: Every day | ORAL | Status: DC
  Administered 2018-11-27: 20 mg via ORAL
  Filled 2018-11-27: qty 1

## 2018-11-27 MED ORDER — BENZONATATE 200 MG PO CAPS: 200.0000 mg | ORAL_CAPSULE | Freq: Two times a day (BID) | ORAL | 0 refills | Status: DC | PRN

## 2018-11-27 MED ORDER — PREDNISONE 10 MG PO TABS: 10.0000 mg | ORAL_TABLET | Freq: Every day | ORAL | 0 refills | Status: AC

## 2018-11-27 NOTE — Progress Notes (Signed)
Please see previous NCM note. HH was set up with [facility]. Contacted [facility] to make aware of scheduled dc home today. Contacted [facility] for neb machine to be delivered to room prior to dc. Jonnie Finner RN CCM Case Mgmt phone [phone]

## 2018-11-27 NOTE — Discharge Instructions (Signed)
Acute Respiratory Failure, Adult ° °Acute respiratory failure occurs when there is not enough oxygen passing from your lungs to your body. When this happens, your lungs have trouble removing carbon dioxide from the blood. This causes your blood oxygen level to drop too low as carbon dioxide builds up. °Acute respiratory failure is a medical emergency. It can develop quickly, but it is temporary if treated promptly. Your lung capacity, or how much air your lungs can hold, may improve with time, exercise, and treatment. °What are the causes? °There are many possible causes of acute respiratory failure, including: °· Lung injury. °· Chest injury or damage to the ribs or tissues near the lungs. °· Lung conditions that affect the flow of air and blood into and out of the lungs, such as pneumonia, acute respiratory distress syndrome, and cystic fibrosis. °· Medical conditions, such as strokes or spinal cord injuries, that affect the muscles and nerves that control breathing. °· Blood infection (sepsis). °· Inflammation of the pancreas (pancreatitis). °· A blood clot in the lungs (pulmonary embolism). °· A large-volume blood transfusion. °· Burns. °· Near-drowning. °· Seizure. °· Smoke inhalation. °· Reaction to medicines. °· Alcohol or drug overdose. °What increases the risk? °This condition is more likely to develop in people who have: °· A blocked airway. °· Asthma. °· A condition or disease that damages or weakens the muscles, nerves, bones, or tissues that are involved in breathing. °· A serious infection. °· A health problem that blocks the unconscious reflex that is involved in breathing, such as hypothyroidism or sleep apnea. °· A lung injury or trauma. °What are the signs or symptoms? °Trouble breathing is the main symptom of acute respiratory failure. Symptoms may also include: °· Rapid breathing. °· Restlessness or anxiety. °· Skin, lips, or fingernails that appear blue (cyanosis). °· Rapid heart  rate. °· Abnormal heart rhythms (arrhythmias). °· Confusion or changes in behavior. °· Tiredness or loss of energy. °· Feeling sleepy or having a loss of consciousness. °How is this diagnosed? °Your health care provider can diagnose acute respiratory failure with a medical history and physical exam. During the exam, your health care provider will listen to your heart and check for crackling or wheezing sounds in your lungs. Your may also have tests to confirm the diagnosis and determine what is causing respiratory failure. These tests may include: °· Measuring the amount of oxygen in your blood (pulse oximetry). The measurement comes from a small device that is placed on your finger, earlobe, or toe. °· Other blood tests to measure blood gases and to look for signs of infection. °· Sampling your cerebral spinal fluid or tracheal fluid to check for infections. °· Chest X-ray to look for fluid in spaces that should be filled with air. °· Electrocardiogram (ECG) to look at the heart's electrical activity. °How is this treated? °Treatment for this condition usually takes places in a hospital intensive care unit (ICU). Treatment depends on what is causing the condition. It may include one or more treatments until your symptoms improve. Treatment may include: °· Supplemental oxygen. Extra oxygen is given through a tube in the nose, a face mask, or a hood. °· A device such as a continuous positive airway pressure (CPAP) or bi-level positive airway pressure (BiPAP or BPAP) machine. This treatment uses mild air pressure to keep the airways open. A mask or other device will be placed over your nose or mouth. A tube that is connected to a motor will deliver oxygen through the   mask. °· Ventilator. This treatment helps move air into and out of the lungs. This may be done with a bag and mask or a machine. For this treatment, a tube is placed in your windpipe (trachea) so air and oxygen can flow to the lungs. °· Extracorporeal  membrane oxygenation (ECMO). This treatment temporarily takes over the function of the heart and lungs, supplying oxygen and removing carbon dioxide. ECMO gives the lungs a chance to recover. It may be used if a ventilator is not effective. °· Tracheostomy. This is a procedure that creates a hole in the neck to insert a breathing tube. °· Receiving fluids and medicines. °· Rocking the bed to help breathing. °Follow these instructions at home: °· Take over-the-counter and prescription medicines only as told by your health care provider. °· Return to normal activities as told by your health care provider. Ask your health care provider what activities are safe for you. °· Keep all follow-up visits as told by your health care provider. This is important. °How is this prevented? °Treating infections and medical conditions that may lead to acute respiratory failure can help prevent the condition from developing. °Contact a health care provider if: °· You have a fever. °· Your symptoms do not improve or they get worse. °Get help right away if: °· You are having trouble breathing. °· You lose consciousness. °· Your have cyanosis or turn blue. °· You develop a rapid heart rate. °· You are confused. °These symptoms may represent a serious problem that is an emergency. Do not wait to see if the symptoms will go away. Get medical help right away. Call your local emergency services (911 in the [place] Do not drive yourself to the hospital. °This information is not intended to replace advice given to you by your health care provider. Make sure you discuss any questions you have with your health care provider. °Document Released: 09/26/2013 Document Revised: 04/18/2016 Document Reviewed: 04/08/2016 °[employer] Interactive Patient Education © 2019 [employer] ° °

## 2018-11-27 NOTE — Progress Notes (Signed)
Pt leaving this afternoon with his spouse, headed home. Pt leaving with his prosthesis, cell phone, bag, glasses, and nebulizer. Pt and his spouse aware of followup appointments. Pt and spouse have stated that pt has appointment this Tuesday at [facility]. Discharge instructions/prescriptions given/explained with pt and spouse verbalizing understanding.

## 2018-11-27 NOTE — Discharge Summary (Signed)
Discharge Summary  Priest Lockridge QBH:[id number] DOB: December 08, 1950  PCP: [name], MD  Admit date: 11/23/2018 Discharge date: 11/27/2018  Time spent: 35 minutes  Recommendations for Outpatient Follow-up:  1. Follow-up with your oncologist at [facility], keep your appointment 2. Follow-up with your PCP 3. Follow-up with your cardiologist 4. Take your medications as prescribed  Discharge Diagnoses:  Active Hospital Problems   Diagnosis Date Noted  . CAP (community acquired pneumonia) 11/23/2018    Resolved Hospital Problems  No resolved problems to display.    Discharge Condition: Stable  Diet recommendation: Resume previous diet  Vitals:   11/27/18 0542 11/27/18 0806  BP: 114/79   Pulse: 92   Resp: 20   Temp: 98.3 F (36.8 C)   SpO2: 100% 96%    History of present illness:  [name] a 68 y.o.malewith medical history significant forhypertension, nonischemic cardiomyopathy, systolic CHF, peripheral vascular disease status post left below the knee amputation, type 2 diabetes, diabetic neuropathy, mycosis fungoides treated at [facility], who presented [facility] ED with complaints of a fever and 2 weeks duration of associated gradually worsening shortness of breath and productive cough. Symptoms are alsopresent at rest.  Denies any chest pain. Denies any sick contacts. No recentlengthy trips. No lower extremity edema. No use of tobacco or alcohol.Last chemotherapy for his mycosis fungoides was 4 weeks prior at [facility]. Per his wife he had a fever of 100.6 at home prior to [facility]  Hospital course complicated by acute hypoxic respiratory failure and intermittent tachycardia.  Also reported asymptomatic nonsustained 2-3 seconds V. tach.  Denies any chest pain, dyspnea, or palpitations.  11/27/2018: Patient seen and examined at his bedside.  No acute events overnight.  He has no new  complaints.  He denies dyspnea, chest pain, or palpitations.  On the day of discharge, the patient was hemodynamically stable.  He will need to follow-up with his primary care provider, oncologist, and cardiologist posthospitalization.  He will also need to take his medications as prescribed.  Has a scheduled appointment at [facility].  Advised to keep his upcoming appointment.  Patient understands and agrees to plan.  Hospital Course:  Active Problems:   CAP (community acquired pneumonia)  Resolving community acquired pneumonia, present admission Presented with 2 weeks duration of productive cough and shortness of breath Per his wife he had a fever of 100.6 at home prior to [facility] Independently reviewed CT chest which showed infiltrates noted on left side,suggestive of pneumonia Completed 5 days of azithromycin and Rocephin Continue duo nebs as needed twice daily for wheezing or shortness of breath Tessalon Perles as needed twice daily for cough Prednisone 10 mg daily x5 days for shortness of breath and wheezing Follow-up with your PCP  Resolved acute hypoxic respiratory failure secondary to community-acquired pneumonia Presented with O2 saturation in the upper 80s Not on O2 supplementation at baseline Passed his home O2 evaluation  Resolved sinus tachycardia History of sinus tachycardia Followed by Dr. Gwenlyn Found Independent reviewed twelve-lead EKG done on 11/25/2018 which revealed sinus tachycardia with no specific ST-T changes Denies chest pain Continue Coreg Reported nonsustained V. tach 2 to 3 seconds Follow-up with your cardiologist  Resolving AKI Appears to be at his baseline creatinine 1.0 with GFR greater than 60 Patient presented with creatinine of 1.31 Continue to avoid nephrotoxic agents Follow-up with your PCP  Improving  steroid-induced hyperglycemia in the setting of  insulin-dependent type 2 diabetes Last hemoglobin A1c 5.9 on 11/03/2018 DC'd IV Solu-Medrol Started on prednisone 10 mg daily x5 days Resume home antidiabetic medications Follow-up with your PCP  History of mycosis fungoides Seen by heme oncology at [facility] Completed first round of chemotherapy 4 weeks ago  Next round of chemotherapy will begin this next Wednesday Follow-up outpatient, keep your appointment  Resolving euvolemic hyponatremia Sodium 134 from 132 Possibly from diuretics Increase solids oral intake  Chronic systolic CHF Last 2D echo revealed LVEF 40 to 45% Continue strict I's and O's and daily weight Continue cardiac medications Net I&O's -4.9 L since admission  Hyperlipidemia  Continue Lipitor  Hypertension Continue Coreg, Lasix  Diabetic neuropathy Continue Lyrica  History of lower extremity embolism and thrombosis/history of hypercoagulable state Continue Xarelto  Peripheral vascular disease status post left lower extremity below-the-knee amputation On Xarelto and Lipitor     Code Status:Full code       Discharge Exam: BP 114/79 (BP Location: Left Arm)   Pulse 92   Temp 98.3 F (36.8 C)   Resp 20   Ht 5\' 9"  (1.753 m)   Wt 88.6 kg   SpO2 96%   BMI 28.84 kg/m  . General: 68 y.o. year-old male well developed well nourished in no acute distress.  Alert and oriented x3. . Cardiovascular: Regular rate and rhythm with no rubs or gallops.  No thyromegaly or JVD noted.   Marland Kitchen Respiratory: Clear to auscultation with no wheezes or rales. Good inspiratory effort. . Abdomen: Soft nontender nondistended with normal bowel sounds x4 quadrants. . Musculoskeletal: No lower extremity edema.  Left below the knee amputation intact. Marland Kitchen Psychiatry: Mood is appropriate for condition and setting  Discharge Instructions You were cared for by a hospitalist during your hospital stay. If you have any questions about your discharge medications or  the care you received while you were in the hospital after you are discharged, you can call the unit and asked to speak with the hospitalist on call if the hospitalist that took care of you is not available. Once you are discharged, your primary care physician will handle any further medical issues. Please note that NO REFILLS for any discharge medications will be authorized once you are discharged, as it is imperative that you return to your primary care physician (or establish a relationship with a primary care physician if you do not have one) for your aftercare needs so that they can reassess your need for medications and monitor your lab values.   Allergies as of 11/27/2018      Reactions   Morphine Shortness Of Breath, Anaphylaxis   Morphine And Related    "took my breath away"   Oxycodone    Pt stated, "It makes me climbs walls; fight wars"   Robaxin [methocarbamol] Other (See Comments)      Medication List    STOP taking these medications   albuterol 108 (90 Base) MCG/ACT inhaler Commonly known as:  PROVENTIL HFA;VENTOLIN HFA Replaced by:  albuterol (2.5 MG/3ML) 0.083% nebulizer solution   fluconazole 100 MG tablet Commonly known as:  DIFLUCAN   hydrOXYzine 25 MG tablet Commonly known as:  ATARAX/VISTARIL   tiZANidine 2 MG tablet Commonly known as:  ZANAFLEX   UNABLE TO FIND     TAKE these medications   ACCU-CHEK AVIVA device Use to check blood sugar 6 times daily   acetaminophen 500 MG tablet Commonly known as:  TYLENOL Take  500-1,000 mg by mouth every 8 (eight) hours as needed for mild pain.   albuterol (2.5 MG/3ML) 0.083% nebulizer solution Commonly known as:  PROVENTIL Take 3 mLs (2.5 mg total) by nebulization 2 (two) times daily as needed for wheezing or shortness of breath. Replaces:  albuterol 108 (90 Base) MCG/ACT inhaler   atorvastatin 20 MG tablet Commonly known as:  LIPITOR TAKE 1 TABLET BY MOUTH EVERY DAY AT 6 What changed:    how much to  take  how to take this  when to take this  additional instructions   B-D UF III MINI PEN NEEDLES 31G X 5 MM Misc Generic drug:  Insulin Pen Needle   benzonatate 200 MG capsule Commonly known as:  TESSALON Take 1 capsule (200 mg total) by mouth 2 (two) times daily as needed for cough.   carvedilol 25 MG tablet Commonly known as:  COREG Take 1.5 tablets (37.5 mg total) by mouth 2 (two) times daily with a meal. What changed:  how much to take   clobetasol cream 0.05 % Commonly known as:  TEMOVATE Apply 1 application topically 2 (two) times daily.   DOCQLACE 100 MG capsule Generic drug:  docusate sodium TAKE 1 CAPSULE BY MOUTH DAILY What changed:    how much to take  when to take this  reasons to take this   Dulaglutide 1.5 MG/0.5ML Sopn Commonly known as:  TRULICITY Inject 1.5 mg into the skin every Tuesday.   ENSURE COMPLETE PO Take 237 mLs by mouth daily.   furosemide 80 MG tablet Commonly known as:  LASIX Take 1 tablet (80 mg total) by mouth 2 (two) times daily. Per patient dosage has increased to 80 mg bid.   glucagon 1 MG injection Inject 1 mg into the vein once as needed (for high blood sugar).   glucose blood test strip Commonly known as:  ACCU-CHEK ACTIVE STRIPS Use to check blood sugar 6 times a day   insulin aspart 100 UNIT/ML FlexPen Commonly known as:  NOVOLOG FLEXPEN INJECT 20-25 UNITS BEFORE BREAKFAST, LUNCH , DINNER What changed:    how much to take  how to take this  when to take this   Insulin Detemir 100 UNIT/ML Pen Commonly known as:  LEVEMIR FLEXTOUCH Inject 30 Units into the skin 2 (two) times daily. What changed:  how much to take   loratadine 10 MG tablet Commonly known as:  CLARITIN Take 10 mg by mouth daily.   Melatonin 3 MG Tabs Take 1 tablet by mouth at bedtime.   nystatin 100000 UNIT/ML suspension Commonly known as:  MYCOSTATIN Take 5 mLs by mouth 3 (three) times daily.   ondansetron 8 MG tablet Commonly known  as:  ZOFRAN Take 8 mg by mouth every 8 (eight) hours as needed for nausea or vomiting.   ONE TOUCH LANCETS Misc Check 2 times daily.   pantoprazole 40 MG tablet Commonly known as:  PROTONIX TAKE 1 TABLET(40 MG) BY MOUTH DAILY What changed:  See the new instructions.   potassium chloride SA 20 MEQ tablet Commonly known as:  KLOR-CON M20 Take 1 tablet (20 mEq total) by mouth daily as needed. What changed:  reasons to take this   predniSONE 10 MG tablet Commonly known as:  DELTASONE Take 1 tablet (10 mg total) by mouth daily with breakfast for 5 days. Start taking on:  November 28, 2018   pregabalin 150 MG capsule Commonly known as:  LYRICA Take 1 capsule (150 mg total) by mouth 2 (  two) times daily.   prochlorperazine 10 MG tablet Commonly known as:  COMPAZINE Take 10 mg by mouth 3 (three) times daily.   rivaroxaban 20 MG Tabs tablet Commonly known as:  XARELTO Take 1 tablet (20 mg total) by mouth daily with supper.   traMADol 50 MG tablet Commonly known as:  ULTRAM Take 50 mg by mouth at bedtime.            Durable Medical Equipment  (From admission, onward)         Start     Ordered   11/27/18 0621  For home use only DME Nebulizer machine  Once    Question:  Patient needs a nebulizer to treat with the following condition  Answer:  Shortness of breath   11/27/18 5400         Allergies  Allergen Reactions  . Morphine Shortness Of Breath and Anaphylaxis  . Morphine And Related     "took my breath away"  . Oxycodone     Pt stated, "It makes me climbs walls; fight wars"  . Robaxin [Methocarbamol] Other (See Comments)   Follow-up Information    Burchette, Alinda Sierras, MD. Call in 1 day(s).   Specialty:  Family Medicine Why:  Please call for a post hospital follow-up appointment. Contact information: [place] [address] [phone]        [name], MD .   Specialties:  Cardiology, Radiology Contact information: [address] [place] [address] [phone]            The results of significant diagnostics from this hospitalization (including imaging, microbiology, ancillary and laboratory) are listed below for reference.    Significant Diagnostic Studies: Dg Chest 2 View  Result Date: 11/23/2018 CLINICAL DATA:  Increased sob, decreased O2 sats this a.m., hx of chf EXAM: CHEST - 2 VIEW COMPARISON:  11/17/2018 FINDINGS: Patient has a LEFT-sided transvenous pacemaker with leads to the RIGHT ventricle. A RIGHT-sided PICC line tip overlies the superior vena cava. Heart size is normal. Interval development of airspace filling opacities throughout the LEFT lung and consistent with infectious process. No pleural effusions. No evidence for pulmonary edema. Degenerative changes are seen in thoracic spine. IMPRESSION: Interval development of LEFT lung infiltrates. Electronically Signed   By: Nolon Nations M.D.   On: 11/23/2018 13:33   Dg Chest 2 View  Result Date: 11/17/2018 CLINICAL DATA:  Dyspnea, leg swelling, Pt hx of blood CA, is seen at [facility]. Pt c/o of feeling weak and nauseated. Pt did not loose consciousness, or fall. EXAM: CHEST - 2 VIEW COMPARISON:  08/23/2018 FINDINGS: Cardiac silhouette top-normal in size. No mediastinal or hilar masses. No evidence of adenopathy. Clear lungs.  No pleural effusion or pneumothorax. Left anterior chest wall single lead AICD and right PICC are stable. IMPRESSION: No active cardiopulmonary disease. Electronically Signed   By: Lajean Manes M.D.   On: 11/17/2018 13:36   Ct Angio Chest Pe W/cm &/or Wo Cm  Result Date: 11/23/2018 CLINICAL DATA:  Shortness of breath and hypoxia. EXAM: CT ANGIOGRAPHY CHEST WITH CONTRAST TECHNIQUE: Multidetector CT imaging of the chest was performed using the standard protocol during bolus administration of intravenous contrast. Multiplanar CT image reconstructions and MIPs were obtained to evaluate the vascular anatomy.  CONTRAST:  63 mL Isovue 370 IV COMPARISON:  Chest x-ray earlier today. FINDINGS: Cardiovascular: The pulmonary arteries are adequately opacified. There is some limited evaluation of distal branches due to respiratory motion.  No evidence of pulmonary embolism. The thoracic aorta is normal in caliber and demonstrates mild atherosclerosis. No evidence of aortic dissection. The heart size is normal. ICD device present with single lead extending to the right ventricular apex. No pericardial fluid identified. Calcified plaque is noted in the distribution of the LAD. There likely is calcified plaque in the RCA but the artery is significantly blurred by motion. Mediastinum/Nodes: There are some scattered small nonenlarged lymph nodes in the mediastinum. The largest in the lower right paratracheal region measures 10 mm in short axis. No hilar lymphadenopathy identified. Nonenlarged right axillary lymph node measures 10 mm in short axis. There are some surgical clips in the right axilla. Lungs/Pleura: Lungs show diffuse central airspace opacity throughout the left upper lobe and extending into the lingula with some extension into the left lower lobe. Airspace opacity spares the immediate subpleural lung. There also is airspace disease in the superior aspect of the right upper lobe. Findings are felt to most likely relate to pulmonary edema. More additional focal airspace opacity in the inferior lingula is more dense and measures approximately 1.4 x 2.0 cm. Small calcified granuloma of the anterior left upper lobe measures 3 mm. No significant pleural effusions. No pneumothorax. Upper Abdomen: No acute abnormality. Musculoskeletal: No chest wall abnormality. No acute or significant osseous findings. Review of the MIP images confirms the above findings. IMPRESSION: 1. Airspace disease throughout the left upper lobe and lingula with some extension into the left lower lobe. There also is some airspace disease in the right upper  lobe. Findings are felt to most likely relate to pulmonary edema. Infection is felt less likely given the peripheral sparing but is in the differential. Other differential considerations include aspiration and pulmonary hemorrhage. More focal opacity in the inferior lingula likely relates to the same process but is more discrete and it would be worth obtaining a follow-up chest CT to make sure this resolves. 2. ICD present with single lead extending to the right ventricular apex. 3. Coronary atherosclerosis with calcified plaque mainly in the distribution of the LAD. 4. Nonenlarged 10 mm right paratracheal and right axillary lymph nodes. Electronically Signed   By: Aletta Edouard M.D.   On: 11/23/2018 16:46    Microbiology: Recent Results (from the past 240 hour(s))  Respiratory Panel by PCR     Status: None   Collection Time: 11/23/18  6:36 PM  Result Value Ref Range Status   Adenovirus NOT DETECTED NOT DETECTED Final   Coronavirus 229E NOT DETECTED NOT DETECTED Final    Comment: (NOTE) The Coronavirus on the Respiratory Panel, DOES NOT test for the novel  Coronavirus (2019 nCoV)    Coronavirus HKU1 NOT DETECTED NOT DETECTED Final   Coronavirus NL63 NOT DETECTED NOT DETECTED Final   Coronavirus OC43 NOT DETECTED NOT DETECTED Final   Metapneumovirus NOT DETECTED NOT DETECTED Final   Rhinovirus / Enterovirus NOT DETECTED NOT DETECTED Final   Influenza A NOT DETECTED NOT DETECTED Final   Influenza B NOT DETECTED NOT DETECTED Final   Parainfluenza Virus 1 NOT DETECTED NOT DETECTED Final   Parainfluenza Virus 2 NOT DETECTED NOT DETECTED Final   Parainfluenza Virus 3 NOT DETECTED NOT DETECTED Final   Parainfluenza Virus 4 NOT DETECTED NOT DETECTED Final   Respiratory Syncytial Virus NOT DETECTED NOT DETECTED Final   Bordetella pertussis NOT DETECTED NOT DETECTED Final   Chlamydophila pneumoniae NOT DETECTED NOT DETECTED Final   Mycoplasma pneumoniae NOT DETECTED NOT DETECTED Final  Comment: Performed at [facility], [place] [address], [place],  [address]     Labs: Basic Metabolic Panel: Recent Labs  Lab 11/23/18 1318 11/24/18 0645 11/26/18 0536  NA 132* 134* 134*  K 4.3 4.4 4.8  CL 101 102 103  CO2 21* 24 21*  GLUCOSE 126* 107* 260*  BUN 18 13 23   CREATININE 1.31* 1.09 1.08  CALCIUM 8.9 9.1 9.6  MG  --  1.8 2.0  PHOS  --   --  5.2*   Liver Function Tests: Recent Labs  Lab 11/23/18 1318  AST 33  ALT 20  ALKPHOS 114  BILITOT 0.5  PROT 6.0*  ALBUMIN 3.3*   No results for input(s): LIPASE, AMYLASE in the last 168 hours. No results for input(s): AMMONIA in the last 168 hours. CBC: Recent Labs  Lab 11/23/18 1318 11/24/18 0645  WBC 7.1 6.9  NEUTROABS 4.3 4.0  HGB 10.6* 10.6*  HCT 32.8* 33.9*  MCV 83.5 85.4  PLT 255 252   Cardiac Enzymes: Recent Labs  Lab 11/23/18 1318  TROPONINI <0.03   BNP: BNP (last 3 results) Recent Labs    08/23/18 1647 11/17/18 1249 11/23/18 1318  BNP 110.3* 17.7 24.4    ProBNP (last 3 results) Recent Labs    06/15/18 1644  PROBNP 17.0    CBG: Recent Labs  Lab 11/26/18 0732 11/26/18 1126 11/26/18 1638 11/26/18 2119 11/27/18 0716  GLUCAP 249* 323* 366* 336* 262*       Signed:  Kayleen Memos, MD [facility] 11/27/2018, 11:00 AM

## 2018-11-27 NOTE — Progress Notes (Signed)
SATURATION QUALIFICATIONS: (This note is used to comply with regulatory documentation for home oxygen)  Patient Saturations on Room Air at Rest = 93%  Patient Saturations on Room Air while Ambulating = 89%  Patient Saturations on 0 Liters of oxygen while Ambulating = 89%  Please briefly explain why patient needs home oxygen:

## 2018-11-29 DIAGNOSIS — C8409 Mycosis fungoides, extranodal and solid organ sites: Secondary | ICD-10-CM | POA: Diagnosis not present

## 2018-11-29 DIAGNOSIS — C8400 Mycosis fungoides, unspecified site: Secondary | ICD-10-CM | POA: Diagnosis not present

## 2018-11-30 ENCOUNTER — Telehealth: Payer: Self-pay | Admitting: *Deleted

## 2018-11-30 DIAGNOSIS — C8400 Mycosis fungoides, unspecified site: Secondary | ICD-10-CM | POA: Diagnosis not present

## 2018-11-30 DIAGNOSIS — Z923 Personal history of irradiation: Secondary | ICD-10-CM | POA: Diagnosis not present

## 2018-11-30 DIAGNOSIS — C8405 Mycosis fungoides, lymph nodes of inguinal region and lower limb: Secondary | ICD-10-CM | POA: Diagnosis not present

## 2018-11-30 DIAGNOSIS — R5383 Other fatigue: Secondary | ICD-10-CM | POA: Diagnosis not present

## 2018-11-30 DIAGNOSIS — Z6830 Body mass index (BMI) 30.0-30.9, adult: Secondary | ICD-10-CM | POA: Diagnosis not present

## 2018-11-30 DIAGNOSIS — R1900 Intra-abdominal and pelvic swelling, mass and lump, unspecified site: Secondary | ICD-10-CM | POA: Diagnosis not present

## 2018-11-30 DIAGNOSIS — R630 Anorexia: Secondary | ICD-10-CM | POA: Diagnosis not present

## 2018-11-30 DIAGNOSIS — Z5112 Encounter for antineoplastic immunotherapy: Secondary | ICD-10-CM | POA: Diagnosis not present

## 2018-11-30 NOTE — Telephone Encounter (Signed)
RN tried to contact patient for TCM f/u. No answer. Left a message for patient to return call.

## 2018-11-30 NOTE — Progress Notes (Signed)
Remote ICD transmission.   

## 2018-12-05 DIAGNOSIS — R69 Illness, unspecified: Secondary | ICD-10-CM | POA: Diagnosis not present

## 2018-12-07 ENCOUNTER — Telehealth: Payer: Self-pay

## 2018-12-07 ENCOUNTER — Other Ambulatory Visit: Payer: Self-pay

## 2018-12-07 NOTE — Telephone Encounter (Signed)
Please see message. °

## 2018-12-07 NOTE — Telephone Encounter (Signed)
ok 

## 2018-12-07 NOTE — Telephone Encounter (Signed)
Copied from [place] [id number]. Topic: Referral - Request for Referral >> Dec 07, 2018 11:34 AM Antonieta Iba C wrote: Pt and pt's spouse called in to get pt's services started back up for his PICC with [facility]. Spouse says that every time pt is hospitalized she has to reach out to PCP to request to have services reinstated.    Please assist.

## 2018-12-07 NOTE — Telephone Encounter (Signed)
Called patient spouse and LMOVM to return call  [place] for [facility] to Discuss results / PCP / recommendations / Schedule patient  We need to have more detail in what the patient needs so we can fill out the appropriate orders for [place]. Not sure what PICC means?  CRM Created.

## 2018-12-12 ENCOUNTER — Telehealth: Payer: Self-pay

## 2018-12-12 ENCOUNTER — Ambulatory Visit (INDEPENDENT_AMBULATORY_CARE_PROVIDER_SITE_OTHER): Payer: Medicare HMO

## 2018-12-12 DIAGNOSIS — I5022 Chronic systolic (congestive) heart failure: Secondary | ICD-10-CM

## 2018-12-12 DIAGNOSIS — Z95810 Presence of automatic (implantable) cardiac defibrillator: Secondary | ICD-10-CM | POA: Diagnosis not present

## 2018-12-12 NOTE — Progress Notes (Signed)
EPIC Encounter for ICM Monitoring  Patient Name: Hodges Treiber is a 68 y.o. male Date: 12/12/2018 Primary Care Physican: [name], MD Primary Cardiologist:Berry Electrophysiologist:Croitoru Last Weight:200lbs Today's Weight:199 lbs       Heart failure questions reviewed and he stated there is swelling in feet and scrotum and groin for the past week.   He took 1 dose of Furosemide 80 mg 2 days ago.    Thoracic impedancereturned close to baseline normaltoday, 12/12/2018.  Prescribed: Sliding scale adjusted by Dr Sallyanne Kuster 11/07/2018. 1.FUROSEMIDE BASED ON YOUR WEIGHT If weight is 210 pounds or more take 80 mg twice daily If weight is 205-209 pounds take 80 mg once daily If weight is 204pounds or less HOLD furosemide  2. TAKE one Potassium 20 mEq tablet ONLY on days you have to take Furosemide TWICE  Labs: 11/26/2018 Creatinine 1.08, BUN 23, Potassium 4.8, Sodium 134, GFR >60 11/24/2018 Creatinine 1.09, BUN 13, Potassium 4.4, Sodium 134, GFR >60  11/23/2018 Creatinine 1.31, BUN 18, Potassium 4.3, Sodium 132, GFR 56->60  11/17/2018 Creatinine 1.40, BUN 19, Potassium 4.4, Sodium 133, GFR 51-59  09/29/2018 Creatinine 1.36, BUN 14, Potassium 3.8, Sodium 142, eGFR 53-61 08/26/2018 Creatinine1.52, BUN27, Potassium3.5, Sodium133, [id number] 08/25/2018 Creatinine1.67, BUN36, Potassium3.5, Sodium134, eGFR40-47  08/24/2018 Creatinine2.33, BUN51, Potassium3.6, Sodium132, eGFR27-31  11/19/2019Creatinine 2.84, BUN56, Potassium3.8, Sodium131, [id number] 07/25/2018 Creatinine 1.6, BUN 25. Potassium 3.6, Sodium 140, Care Everywhere 07/08/2018 Creatinine 1.40, BUN 23, Potassium 3.4, Sodium 136, eGFR 50-58 06/15/2018 Creatinine 1.77, BUN 30, Potassium 4.3, Sodium 135, EGFR 49.45 04/18/2018 Creatinine 1.22, BUN 15, Potassium 4.4, Sodium 139, EGFR 76.01  Recommendations:Advised will have scheduler from Dr Sallyanne Kuster and Dr Kennon Holter office call him  to make an appointment with NP/PA due to groin/scrotum swellint.  Advised to go to ER if condition worsens.    Follow-up plan: ICM clinic phone appointment on3/23/2020.  Copy of ICM check sent to Dr.Croituru and Dr Gwenlyn Found.  3 month ICM trend: 12/12/2018    1 Year ICM trend:       Rosalene Billings, RN 12/12/2018 1:55 PM

## 2018-12-12 NOTE — Telephone Encounter (Signed)
Pt has an appt tomorrow for hospital follow up

## 2018-12-12 NOTE — Telephone Encounter (Signed)
Left message for patient to remind of missed remote transmission.  

## 2018-12-12 NOTE — Telephone Encounter (Signed)
Spoke with patient for monthly ICM follow up.  Advised of remote transmission results.     SYMPTOMS: Pt reports swelling of feet, scrotum and groin areas for past week.  He has taken 1 dose of Furosemide 80 mg this week due to weight increased to 201 lbs.  Today's weight 199 lbs.   OPTIVOLREPORT: See cc'd ICM chart note for details.  Thoracic impedance suggesting fluid accumulation for past week but almost back at baseline today, 12/12/2018.           PRESCRIBED: Sliding scale adjusted by Dr Sallyanne Kuster 11/07/2018. 1.FUROSEMIDE BASED ON YOUR WEIGHT If weight is 210 pounds or more take 80 mg twice daily If weight is 205-209 pounds take 80 mg once daily If weight is 204pounds or less HOLD furosemide  RECOMMENDATIONS: Will have patient scheduled with APP for next available appointment due to scrotal/groin swelling.  Please advise if other recommendations are needed.  Message sent to [facility] scheduling pool.   Recheck fluid levels and remote transmission scheduled 12/26/2018.

## 2018-12-13 ENCOUNTER — Other Ambulatory Visit: Payer: Self-pay

## 2018-12-13 ENCOUNTER — Ambulatory Visit (INDEPENDENT_AMBULATORY_CARE_PROVIDER_SITE_OTHER): Payer: Medicare HMO | Admitting: Family Medicine

## 2018-12-13 ENCOUNTER — Encounter: Payer: Self-pay | Admitting: Family Medicine

## 2018-12-13 ENCOUNTER — Ambulatory Visit: Payer: Medicare HMO | Admitting: Adult Health

## 2018-12-13 VITALS — BP 102/64 | HR 108 | Temp 97.8°F | Ht 69.0 in | Wt 205.9 lb

## 2018-12-13 DIAGNOSIS — J189 Pneumonia, unspecified organism: Secondary | ICD-10-CM

## 2018-12-13 DIAGNOSIS — G546 Phantom limb syndrome with pain: Secondary | ICD-10-CM | POA: Diagnosis not present

## 2018-12-13 DIAGNOSIS — I5042 Chronic combined systolic (congestive) and diastolic (congestive) heart failure: Secondary | ICD-10-CM | POA: Diagnosis not present

## 2018-12-13 DIAGNOSIS — I10 Essential (primary) hypertension: Secondary | ICD-10-CM

## 2018-12-13 MED ORDER — BENZONATATE 200 MG PO CAPS: 200.0000 mg | ORAL_CAPSULE | Freq: Three times a day (TID) | ORAL | 0 refills | Status: DC | PRN

## 2018-12-13 MED ORDER — TRAMADOL HCL 50 MG PO TABS: 50.0000 mg | ORAL_TABLET | Freq: Two times a day (BID) | ORAL | 1 refills | Status: DC | PRN

## 2018-12-13 NOTE — Patient Instructions (Addendum)
Follow up for any recurrent fever or increased shortness of breath  Continue with daily weight and follow instructions from cardiology regarding lasix.
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
## 2018-12-14 DIAGNOSIS — R5383 Other fatigue: Secondary | ICD-10-CM | POA: Diagnosis not present

## 2018-12-14 DIAGNOSIS — N5089 Other specified disorders of the male genital organs: Secondary | ICD-10-CM | POA: Diagnosis not present

## 2018-12-14 DIAGNOSIS — C8408 Mycosis fungoides, lymph nodes of multiple sites: Secondary | ICD-10-CM | POA: Diagnosis not present

## 2018-12-14 DIAGNOSIS — Z794 Long term (current) use of insulin: Secondary | ICD-10-CM | POA: Diagnosis not present

## 2018-12-14 DIAGNOSIS — Z6831 Body mass index (BMI) 31.0-31.9, adult: Secondary | ICD-10-CM | POA: Diagnosis not present

## 2018-12-14 DIAGNOSIS — R590 Localized enlarged lymph nodes: Secondary | ICD-10-CM | POA: Diagnosis not present

## 2018-12-14 DIAGNOSIS — R634 Abnormal weight loss: Secondary | ICD-10-CM | POA: Diagnosis not present

## 2018-12-14 DIAGNOSIS — E1165 Type 2 diabetes mellitus with hyperglycemia: Secondary | ICD-10-CM | POA: Diagnosis not present

## 2018-12-14 DIAGNOSIS — C8400 Mycosis fungoides, unspecified site: Secondary | ICD-10-CM | POA: Diagnosis not present

## 2018-12-14 DIAGNOSIS — E119 Type 2 diabetes mellitus without complications: Secondary | ICD-10-CM | POA: Diagnosis not present

## 2018-12-14 DIAGNOSIS — R234 Changes in skin texture: Secondary | ICD-10-CM | POA: Diagnosis not present

## 2018-12-14 DIAGNOSIS — Z713 Dietary counseling and surveillance: Secondary | ICD-10-CM | POA: Diagnosis not present

## 2018-12-14 NOTE — Telephone Encounter (Signed)
Thanks MCr 

## 2018-12-14 NOTE — Progress Notes (Signed)
Patient has appt with Kerin Ransom, PA on 12/20/2018 and declined an earlier appointment per scheduler.  Dr Sallyanne Kuster did not have any further recommendations at this time.

## 2018-12-15 DIAGNOSIS — H2000 Unspecified acute and subacute iridocyclitis: Secondary | ICD-10-CM | POA: Diagnosis not present

## 2018-12-16 ENCOUNTER — Inpatient Hospital Stay: Payer: Medicare HMO | Admitting: Family Medicine

## 2018-12-16 NOTE — Telephone Encounter (Signed)
Patient came in to office and discussed with Dr. Elease Hashimoto.

## 2018-12-20 ENCOUNTER — Ambulatory Visit: Payer: Medicare HMO | Admitting: Cardiology

## 2018-12-20 ENCOUNTER — Encounter: Payer: Self-pay | Admitting: Cardiology

## 2018-12-20 ENCOUNTER — Other Ambulatory Visit: Payer: Self-pay

## 2018-12-20 VITALS — BP 117/79 | HR 101 | Temp 97.7°F | Ht 69.0 in | Wt 196.6 lb

## 2018-12-20 DIAGNOSIS — I10 Essential (primary) hypertension: Secondary | ICD-10-CM | POA: Diagnosis not present

## 2018-12-20 MED ORDER — FUROSEMIDE 80 MG PO TABS: ORAL_TABLET | ORAL | 0 refills | Status: DC

## 2018-12-20 NOTE — Progress Notes (Signed)
12/20/2018 [name]   1951-05-01  [id number]  Primary Physician Burchette, Alinda Sierras, MD Primary Cardiologist: Dr Dot Been and [facility]                                    Dr Gwenlyn Found PV  HPI:  Pleasant 67y/o [place] male with a history of DM, s/p Lt BKA, HTN, NICM, andmycosis fungoides. He is being followed at [facility] being treated with chemotherapy and radiation. He has a history of NICM. His EF in May 2016 was 30-35%. F/U echo in Oct 2016 was unchanged and he underwent MDT ICD implant Nov 2016. Cath May 2016 showed 75% dRCA. Myoview March 2019 was low risk.  He has been very sensitive volume shifts.  He was hospitalized last year with hypovolemia last year after one dose of metolazone.   He was just discharged 11/27/2018 after an admission for CAP.  His discharge weight was 193 lbs (he diuresed 5L in the hospital).  He was discharged on new Lasix instructions.  If his weight is under 200 lbs he takes no Lasix, if he is > 200 lbs he takes Lasix 80 mg daily, if he is over 205 lbs he takes 80 mg BID and K+.  Since discharge he says he feels like he is doing pretty well, no increased dyspnea.  His labs are followed at [facility].  He is to start a new course of treatment there next week.    Current Outpatient Medications  Medication Sig Dispense Refill  . acetaminophen (TYLENOL) 500 MG tablet Take 500-1,000 mg by mouth every 8 (eight) hours as needed for mild pain.     Marland Kitchen albuterol (PROVENTIL) (2.5 MG/3ML) 0.083% nebulizer solution Take 3 mLs (2.5 mg total) by nebulization 2 (two) times daily as needed for wheezing or shortness of breath. 75 mL 0  . atorvastatin (LIPITOR) 20 MG tablet TAKE 1 TABLET BY MOUTH EVERY DAY AT 6 (Patient taking differently: Take 20 mg by mouth daily. ) 90 tablet 0  . B-D UF III MINI PEN NEEDLES 31G X 5 MM MISC     . benzonatate (TESSALON) 200 MG capsule Take 1 capsule (200 mg total) by mouth 3 (three) times daily as needed for cough. 30 capsule 0  . Blood  Glucose Monitoring Suppl (ACCU-CHEK AVIVA) device Use to check blood sugar 6 times daily 1 each 0  . carvedilol (COREG) 25 MG tablet Take 1.5 tablets (37.5 mg total) by mouth 2 (two) times daily with a meal. (Patient taking differently: Take 25 mg by mouth 2 (two) times daily with a meal. ) 270 tablet 3  . clobetasol cream (TEMOVATE) 8.81 % Apply 1 application topically 2 (two) times daily.     . DOCQLACE 100 MG capsule TAKE 1 CAPSULE BY MOUTH DAILY (Patient taking differently: Take 100 mg by mouth daily as needed for mild constipation. ) 30 capsule 0  . Dulaglutide (TRULICITY) 1.5 JS/3.1RX SOPN Inject 1.5 mg into the skin every Tuesday. 12 pen 3  . furosemide (LASIX) 80 MG tablet Take 1 tablet (80 mg total) by mouth 2 (two) times daily. Per patient dosage has increased to 80 mg bid. (Patient taking differently: Take 80 mg by mouth as needed. Take 1 tablet as needed if weight is over 200 lbs) 90 tablet 0  . glucagon 1 MG injection Inject 1 mg into the vein once as needed (for high blood  sugar). 1 each 3  . glucose blood (ACCU-CHEK ACTIVE STRIPS) test strip Use to check blood sugar 6 times a day 300 each 4  . insulin aspart (NOVOLOG FLEXPEN) 100 UNIT/ML FlexPen INJECT 20-25 UNITS BEFORE BREAKFAST, LUNCH , DINNER (Patient taking differently: Inject 20 Units into the skin 3 (three) times daily with meals. INJECT 20-25 UNITS BEFORE BREAKFAST, LUNCH , DINNER) 15 mL 3  . Insulin Detemir (LEVEMIR FLEXTOUCH) 100 UNIT/ML Pen Inject 30 Units into the skin 2 (two) times daily. (Patient taking differently: Inject 24 Units into the skin 2 (two) times daily. ) 15 pen 3  . loratadine (CLARITIN) 10 MG tablet Take 10 mg by mouth daily.    . Melatonin 3 MG TABS Take 1 tablet by mouth at bedtime.    . Nutritional Supplements (ENSURE COMPLETE PO) Take 237 mLs by mouth daily.     Marland Kitchen nystatin (MYCOSTATIN) 100000 UNIT/ML suspension Take 5 mLs by mouth 3 (three) times daily.     . ondansetron (ZOFRAN) 8 MG tablet Take 8 mg by  mouth every 8 (eight) hours as needed for nausea or vomiting.     . ONE TOUCH LANCETS MISC Check 2 times daily. 100 each 3  . pantoprazole (PROTONIX) 40 MG tablet TAKE 1 TABLET(40 MG) BY MOUTH DAILY (Patient taking differently: Take 40 mg by mouth daily. ) 90 tablet 3  . potassium chloride SA (KLOR-CON M20) 20 MEQ tablet Take 1 tablet (20 mEq total) by mouth daily as needed. (Patient taking differently: Take 20 mEq by mouth daily as needed (when taking 2 tablets of furosemide). ) 45 tablet 3  . prochlorperazine (COMPAZINE) 10 MG tablet Take 10 mg by mouth 3 (three) times daily.     . rivaroxaban (XARELTO) 20 MG TABS tablet Take 1 tablet (20 mg total) by mouth daily with supper. 30 tablet 5  . traMADol (ULTRAM) 50 MG tablet Take 1 tablet (50 mg total) by mouth every 12 (twelve) hours as needed for moderate pain. 60 tablet 1   No current facility-administered medications for this visit.     Allergies  Allergen Reactions  . Morphine Shortness Of Breath and Anaphylaxis  . Morphine And Related     "took my breath away"  . Oxycodone     Pt stated, "It makes me climbs walls; fight wars"  . Robaxin [Methocarbamol] Other (See Comments)    Past Medical History:  Diagnosis Date  . Arthritis    knees, shoulder, hands   . Chronic combined systolic and diastolic CHF (congestive heart failure) ([place]) 07/17/2016  . Coronary artery disease   . Critical lower limb ischemia   . Depression   . Hyperlipidemia   . Hypertension   . Mycosis fungoides ([place])    ALK negative; TCR positive; CD30 positive, CD3 positive.   . Nonischemic cardiomyopathy ([place])   . Peripheral vascular disease ([place])   . Pneumonia 2013   hosp. - MCH x1 week   . Renal insufficiency 03/08/2018  . Shortness of breath dyspnea    related to pain currently  . SIRS (systemic inflammatory response syndrome) ([place]) 03/2018  . Sleep apnea    10-20 yrs. ago, states he used CPAP, not needed anymore.   . Type II diabetes mellitus ([place])      Social History   Socioeconomic History  . Marital status: Married    Spouse name: Not on file  . Number of children: 2  . Years of education: Not on file  . Highest education level:  Not on file  Occupational History    Comment: upholster.   Social Needs  . Financial resource strain: Not on file  . Food insecurity:    Worry: Not on file    Inability: Not on file  . Transportation needs:    Medical: Not on file    Non-medical: Not on file  Tobacco Use  . Smoking status: Never Smoker  . Smokeless tobacco: Never Used  Substance and Sexual Activity  . Alcohol use: No    Alcohol/week: 0.0 standard drinks  . Drug use: No  . Sexual activity: Not Currently  Lifestyle  . Physical activity:    Days per week: Not on file    Minutes per session: Not on file  . Stress: Not on file  Relationships  . Social connections:    Talks on phone: Not on file    Gets together: Not on file    Attends religious service: Not on file    Active member of club or organization: Not on file    Attends meetings of clubs or organizations: Not on file    Relationship status: Not on file  . Intimate partner violence:    Fear of current or ex partner: Not on file    Emotionally abused: Not on file    Physically abused: Not on file    Forced sexual activity: Not on file  Other Topics Concern  . Not on file  Social History Narrative   Lives with wife.      Family History  Problem Relation Age of Onset  . CAD Father 57       Died 24  . Hypertension Father   . Heart failure Mother 40  . Diabetes Maternal Grandmother   . CAD Paternal Grandfather 45  . Colon cancer Neg Hx   . Esophageal cancer Neg Hx   . Stomach cancer Neg Hx   . Rectal cancer Neg Hx      Review of Systems: General: negative for chills, fever, night sweats or weight changes.  Cardiovascular: negative for chest pain, dyspnea on exertion, edema, orthopnea, palpitations, paroxysmal nocturnal dyspnea or shortness of breath  Dermatological: negative for rash Respiratory: negative for cough or wheezing Urologic: negative for hematuria Abdominal: negative for nausea, vomiting, diarrhea, bright red blood per rectum, melena, or hematemesis Neurologic: negative for visual changes, syncope, or dizziness All other systems reviewed and are otherwise negative except as noted above.    Blood pressure 117/79, pulse (!) 101, temperature 97.7 F (36.5 C), temperature source Oral, height _0  (1.753 m), weight 196 lb 9.6 oz (89.2 kg).  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  F/U Dr  Sallyanne Kuster in 2-3 months.   Kerin Ransom PA-C 12/20/2018 11:53 AM

## 2018-12-20 NOTE — Progress Notes (Signed)
ty MCr

## 2018-12-20 NOTE — Patient Instructions (Addendum)
Medication Instructions:  Take NO Lasix if weight is UNDER 200 Take 1 tablet daily if weight over 200 Take 1 tablet twice a day if weight is over 205  If you need a refill on your cardiac medications before your next appointment, please call your pharmacy.   Lab work: None  If you have labs (blood work) drawn today and your tests are completely normal, you will receive your results only by: Marland Kitchen MyChart Message (if you have MyChart) OR . A paper copy in the mail If you have any lab test that is abnormal or we need to change your treatment, we will call you to review the results.  Testing/Procedures: None   Follow-Up: At [facility], you and your health needs are our priority.  As part of our continuing mission to provide you with exceptional heart care, we have created designated Provider Care Teams.  These Care Teams include your primary Cardiologist (physician) and Advanced Practice Providers (APPs -  Physician Assistants and Nurse Practitioners) who all work together to provide you with the care you need, when you need it. You will need a follow up appointment in 2-3 months. You may see Dr. Dani Gobble Croitoru or one of the following Advanced Practice Providers on your designated Care Team: [place], [place] . Fabian Sharp, PA-C  Any Other Special Instructions Will Be Listed Below (If Applicable).

## 2018-12-21 DIAGNOSIS — C8408 Mycosis fungoides, lymph nodes of multiple sites: Secondary | ICD-10-CM | POA: Diagnosis not present

## 2018-12-21 DIAGNOSIS — Z5112 Encounter for antineoplastic immunotherapy: Secondary | ICD-10-CM | POA: Diagnosis not present

## 2018-12-21 DIAGNOSIS — Z79899 Other long term (current) drug therapy: Secondary | ICD-10-CM | POA: Diagnosis not present

## 2018-12-21 DIAGNOSIS — C8400 Mycosis fungoides, unspecified site: Secondary | ICD-10-CM | POA: Diagnosis not present

## 2018-12-23 DIAGNOSIS — H2000 Unspecified acute and subacute iridocyclitis: Secondary | ICD-10-CM | POA: Diagnosis not present

## 2018-12-26 ENCOUNTER — Ambulatory Visit (INDEPENDENT_AMBULATORY_CARE_PROVIDER_SITE_OTHER): Payer: Medicare HMO

## 2018-12-26 ENCOUNTER — Other Ambulatory Visit: Payer: Self-pay

## 2018-12-26 ENCOUNTER — Telehealth: Payer: Self-pay

## 2018-12-26 DIAGNOSIS — Z95810 Presence of automatic (implantable) cardiac defibrillator: Secondary | ICD-10-CM

## 2018-12-26 DIAGNOSIS — J189 Pneumonia, unspecified organism: Secondary | ICD-10-CM

## 2018-12-26 DIAGNOSIS — I5022 Chronic systolic (congestive) heart failure: Secondary | ICD-10-CM

## 2018-12-26 NOTE — Telephone Encounter (Signed)
OK to put in referral?

## 2018-12-26 NOTE — Telephone Encounter (Signed)
I have placed order

## 2018-12-26 NOTE — Telephone Encounter (Signed)
Unfortunately I am not able to answer the questions to fill this out correctly so you will need to place these orders. Thank you.

## 2018-12-26 NOTE — Telephone Encounter (Signed)
Copied from [place] [id number]. Topic: Referral - Request for Referral >> Dec 07, 2018 11:34 AM Antonieta Iba C wrote: Pt and pt's spouse called in to get pt's services started back up for his PICC with [facility]. Spouse says that every time pt is hospitalized she has to reach out to PCP to request to have services reinstated.    Please assist. >> Dec 26, 2018  1:00 PM Ivar Drape wrote: Patient stated that a Thayer Headings at [facility] said they have not received a referral from the doctor's office for home care work.  Please re-submit ASAP because the patient needs his Picc taken care of this week.

## 2018-12-26 NOTE — Telephone Encounter (Signed)
Please see all messages and complete. Thank you!

## 2018-12-27 NOTE — Progress Notes (Signed)
EPIC Encounter for ICM Monitoring  Patient Name: Eric Price is a 68 y.o. male Date: 12/27/2018 Primary Care Physican: [name], MD Primary Cardiologist:Berry Electrophysiologist:Croitoru Goal Weight:195 lbs 12/27/2018 Weight:198lbs  Since 12-Dec-2018 VT-NS (>4 beats, >200 bpm) 2 episodes   Heart failure questions reviewed and he is asymptomatic.  He reports he is feeling fine at this time.   He is receiving radiation and chemotherapy at [facility] for Mycosis fungoides.   Thoracic impedance normal.  Prescribed: Furosemide 80 mg                          Take NO Lasix if weight is UNDER 200                          Take 1 tablet daily if weight over 200                    Take 1 tablet twice a day if weight is over 205  Potassium 20 mEq Take 20 mEq by mouth daily as needed (when taking 2 tablets of furosemide).   Labs: 11/30/2018 Creatinine 1.1,   BUN 24, Potassium 4.5, Sodium 133, GFR 80   Care Everywhere 12/14/2018 Creatinine 1.3,   BUN 13, Potassium 4.2, Sodium 136, GFR 65   Care Everywhere 12/21/2018 Creatinine 1.2,   BUN 18, Potassium 4.2, Sodium 133, GFR 72   Care Everywhere 11/26/2018 Creatinine 1.08, BUN 23, Potassium 4.8, Sodium 134, GFR >60 11/24/2018 Creatinine 1.09, BUN 13, Potassium 4.4, Sodium 134, GFR >60  11/23/2018 Creatinine 1.31, BUN 18, Potassium 4.3, Sodium 132, GFR 56->60  11/17/2018 Creatinine 1.40, BUN 19, Potassium 4.4, Sodium 133, GFR 51-59  A complete set of results can be found in Results Review.  Recommendations:No changes and encouraged to call for any fluid symptoms.     Follow-up plan: ICM clinic phone appointment on4/27/2020. Office visit with Almyra Deforest, PA 02/23/2019.  Copy of ICM check sent to Dr.Croitoru.  3 month ICM trend: 12/26/2018    1 Year ICM trend:       Rosalene Billings, RN 12/27/2018 9:25 AM

## 2018-12-28 NOTE — Progress Notes (Signed)
TY MCr

## 2018-12-29 ENCOUNTER — Other Ambulatory Visit: Payer: Self-pay | Admitting: Family Medicine

## 2018-12-30 ENCOUNTER — Telehealth: Payer: Self-pay | Admitting: Family Medicine

## 2018-12-30 ENCOUNTER — Ambulatory Visit: Payer: Medicare HMO | Admitting: Cardiovascular Disease

## 2018-12-30 NOTE — Telephone Encounter (Signed)
Copied from [place] [id number]. Topic: Quick Communication - See Telephone Encounter >> Dec 30, 2018  2:24 PM Rayann Heman wrote: CRM for notification. See Telephone encounter for: 12/30/18.pt wife called and left a voicemail and stated that they need a call bak regarding home health. Wife stated that we need to send the order over to [facility] so that they can come in and replace picc line. Please advise

## 2018-12-30 NOTE — Telephone Encounter (Signed)
Called Eric Price with [facility] and left a detailed voice to give her the verbal OK per Dr. Elease Hashimoto for Picc line care for patient.  Also called patient and gave them the good news! They verbally thanked me and are very happy this can be done.

## 2018-12-30 NOTE — Telephone Encounter (Signed)
After multiple phone calls and community messages to [place] we are not able to provide a PICC service for patient at this time. Patient has been advised to reach out to [facility] to have them assist with these services. Patient verbalized an understanding.

## 2018-12-30 NOTE — Telephone Encounter (Signed)
Please advise 

## 2018-12-30 NOTE — Telephone Encounter (Signed)
Sending to [facility]

## 2018-12-30 NOTE — Telephone Encounter (Signed)
Do you want to continue this for Eric Price?

## 2018-12-30 NOTE — Telephone Encounter (Signed)
Angel calling with [facility] called and would like verbals for resumption of care Cb#[phone]

## 2018-12-30 NOTE — Telephone Encounter (Signed)
URGENT! Pt needs orders to go to [facility].  Fax # [phone]. Pt needs this order for a Picc dressing change that needs to be changed asap and is past due for changing. Home health orders that were already sent had a price of $6000 but his insurance covers the [facility].

## 2019-01-02 NOTE — Telephone Encounter (Signed)
Refill once OK. 

## 2019-01-04 DIAGNOSIS — C8405 Mycosis fungoides, lymph nodes of inguinal region and lower limb: Secondary | ICD-10-CM | POA: Diagnosis not present

## 2019-01-04 DIAGNOSIS — C8400 Mycosis fungoides, unspecified site: Secondary | ICD-10-CM | POA: Diagnosis not present

## 2019-01-04 DIAGNOSIS — Z923 Personal history of irradiation: Secondary | ICD-10-CM | POA: Diagnosis not present

## 2019-01-04 DIAGNOSIS — N5089 Other specified disorders of the male genital organs: Secondary | ICD-10-CM | POA: Diagnosis not present

## 2019-01-04 DIAGNOSIS — R1909 Other intra-abdominal and pelvic swelling, mass and lump: Secondary | ICD-10-CM | POA: Diagnosis not present

## 2019-01-05 DIAGNOSIS — R69 Illness, unspecified: Secondary | ICD-10-CM | POA: Diagnosis not present

## 2019-01-11 DIAGNOSIS — C84A0 Cutaneous T-cell lymphoma, unspecified, unspecified site: Secondary | ICD-10-CM | POA: Diagnosis not present

## 2019-01-11 DIAGNOSIS — C8400 Mycosis fungoides, unspecified site: Secondary | ICD-10-CM | POA: Diagnosis not present

## 2019-01-11 DIAGNOSIS — R5381 Other malaise: Secondary | ICD-10-CM | POA: Diagnosis not present

## 2019-01-11 DIAGNOSIS — Z89512 Acquired absence of left leg below knee: Secondary | ICD-10-CM | POA: Diagnosis not present

## 2019-01-11 DIAGNOSIS — I5020 Unspecified systolic (congestive) heart failure: Secondary | ICD-10-CM | POA: Diagnosis not present

## 2019-01-11 DIAGNOSIS — G9341 Metabolic encephalopathy: Secondary | ICD-10-CM | POA: Diagnosis not present

## 2019-01-11 DIAGNOSIS — L299 Pruritus, unspecified: Secondary | ICD-10-CM | POA: Diagnosis not present

## 2019-01-11 DIAGNOSIS — R4781 Slurred speech: Secondary | ICD-10-CM | POA: Diagnosis not present

## 2019-01-11 DIAGNOSIS — R471 Dysarthria and anarthria: Secondary | ICD-10-CM | POA: Diagnosis not present

## 2019-01-11 DIAGNOSIS — N179 Acute kidney failure, unspecified: Secondary | ICD-10-CM | POA: Diagnosis not present

## 2019-01-11 DIAGNOSIS — Z452 Encounter for adjustment and management of vascular access device: Secondary | ICD-10-CM | POA: Diagnosis not present

## 2019-01-11 DIAGNOSIS — R4182 Altered mental status, unspecified: Secondary | ICD-10-CM | POA: Diagnosis not present

## 2019-01-11 DIAGNOSIS — R531 Weakness: Secondary | ICD-10-CM | POA: Diagnosis not present

## 2019-01-11 DIAGNOSIS — C84A8 Cutaneous T-cell lymphoma, unspecified, lymph nodes of multiple sites: Secondary | ICD-10-CM | POA: Diagnosis not present

## 2019-01-11 DIAGNOSIS — I429 Cardiomyopathy, unspecified: Secondary | ICD-10-CM | POA: Diagnosis not present

## 2019-01-11 DIAGNOSIS — I428 Other cardiomyopathies: Secondary | ICD-10-CM | POA: Diagnosis not present

## 2019-01-11 DIAGNOSIS — Z9289 Personal history of other medical treatment: Secondary | ICD-10-CM | POA: Diagnosis not present

## 2019-01-11 DIAGNOSIS — I110 Hypertensive heart disease with heart failure: Secondary | ICD-10-CM | POA: Diagnosis not present

## 2019-01-11 DIAGNOSIS — Z515 Encounter for palliative care: Secondary | ICD-10-CM | POA: Diagnosis not present

## 2019-01-11 DIAGNOSIS — C8405 Mycosis fungoides, lymph nodes of inguinal region and lower limb: Secondary | ICD-10-CM | POA: Diagnosis not present

## 2019-01-11 DIAGNOSIS — E8770 Fluid overload, unspecified: Secondary | ICD-10-CM | POA: Diagnosis not present

## 2019-01-11 DIAGNOSIS — J189 Pneumonia, unspecified organism: Secondary | ICD-10-CM | POA: Diagnosis not present

## 2019-01-11 DIAGNOSIS — D649 Anemia, unspecified: Secondary | ICD-10-CM | POA: Diagnosis not present

## 2019-01-11 DIAGNOSIS — N492 Inflammatory disorders of scrotum: Secondary | ICD-10-CM | POA: Diagnosis not present

## 2019-01-11 DIAGNOSIS — Z7189 Other specified counseling: Secondary | ICD-10-CM | POA: Diagnosis not present

## 2019-01-11 DIAGNOSIS — I5022 Chronic systolic (congestive) heart failure: Secondary | ICD-10-CM | POA: Diagnosis not present

## 2019-01-11 DIAGNOSIS — N5089 Other specified disorders of the male genital organs: Secondary | ICD-10-CM | POA: Diagnosis not present

## 2019-01-11 DIAGNOSIS — I2510 Atherosclerotic heart disease of native coronary artery without angina pectoris: Secondary | ICD-10-CM | POA: Diagnosis not present

## 2019-01-11 DIAGNOSIS — R918 Other nonspecific abnormal finding of lung field: Secondary | ICD-10-CM | POA: Diagnosis not present

## 2019-01-16 ENCOUNTER — Telehealth: Payer: Self-pay

## 2019-01-16 NOTE — Telephone Encounter (Signed)
Prior authorization for [employer] test strips, 4 per day has been approved by patient's insurance.  ID# [id number]  Approval letter has been sent to scanning.

## 2019-01-18 ENCOUNTER — Telehealth: Payer: Self-pay | Admitting: Family Medicine

## 2019-01-18 MED ORDER — SENNOSIDES-DOCUSATE SODIUM 8.6-50 MG PO TABS
2.00 | ORAL_TABLET | ORAL | Status: DC
Start: ? — End: 2019-01-18

## 2019-01-18 MED ORDER — IPRATROPIUM-ALBUTEROL 0.5-2.5 (3) MG/3ML IN SOLN
3.00 | RESPIRATORY_TRACT | Status: DC
Start: ? — End: 2019-01-18

## 2019-01-18 MED ORDER — BENZONATATE 100 MG PO CAPS
200.00 | ORAL_CAPSULE | ORAL | Status: DC
Start: ? — End: 2019-01-18

## 2019-01-18 MED ORDER — PROCHLORPERAZINE MALEATE 10 MG PO TABS
10.00 | ORAL_TABLET | ORAL | Status: DC
Start: ? — End: 2019-01-18

## 2019-01-18 MED ORDER — AQUAPHOR EX OINT
TOPICAL_OINTMENT | CUTANEOUS | Status: DC
Start: 2019-01-18 — End: 2019-01-18

## 2019-01-18 MED ORDER — INSULIN GLARGINE 100 UNIT/ML ~~LOC~~ SOLN
10.00 | SUBCUTANEOUS | Status: DC
Start: 2019-01-18 — End: 2019-01-18

## 2019-01-18 MED ORDER — ATORVASTATIN CALCIUM 20 MG PO TABS
20.00 | ORAL_TABLET | ORAL | Status: DC
Start: 2019-01-18 — End: 2019-01-18

## 2019-01-18 MED ORDER — INSULIN LISPRO 100 UNIT/ML ~~LOC~~ SOLN
0.00 | SUBCUTANEOUS | Status: DC
Start: 2019-01-18 — End: 2019-01-18

## 2019-01-18 MED ORDER — POLYETHYLENE GLYCOL 3350 17 G PO PACK
17.00 | PACK | ORAL | Status: DC
Start: ? — End: 2019-01-18

## 2019-01-18 MED ORDER — PREDNISOLONE ACETATE 1 % OP SUSP
1.00 | OPHTHALMIC | Status: DC
Start: 2019-01-18 — End: 2019-01-18

## 2019-01-18 MED ORDER — DEXTROSE 50 % IV SOLN
12.50 | INTRAVENOUS | Status: DC
Start: ? — End: 2019-01-18

## 2019-01-18 MED ORDER — ACYCLOVIR 200 MG PO CAPS
400.00 | ORAL_CAPSULE | ORAL | Status: DC
Start: 2019-01-18 — End: 2019-01-18

## 2019-01-18 MED ORDER — TRAMADOL HCL 50 MG PO TABS
50.00 | ORAL_TABLET | ORAL | Status: DC
Start: ? — End: 2019-01-18

## 2019-01-18 MED ORDER — RIVAROXABAN 20 MG PO TABS
20.00 | ORAL_TABLET | ORAL | Status: DC
Start: 2019-01-18 — End: 2019-01-18

## 2019-01-18 MED ORDER — CARVEDILOL 25 MG PO TABS
25.00 | ORAL_TABLET | ORAL | Status: DC
Start: 2019-01-18 — End: 2019-01-18

## 2019-01-18 MED ORDER — PANTOPRAZOLE SODIUM 40 MG PO TBEC
40.00 | DELAYED_RELEASE_TABLET | ORAL | Status: DC
Start: 2019-01-19 — End: 2019-01-18

## 2019-01-18 MED ORDER — GLUCAGON HCL RDNA (DIAGNOSTIC) 1 MG IJ SOLR
1.00 | INTRAMUSCULAR | Status: DC
Start: ? — End: 2019-01-18

## 2019-01-18 MED ORDER — GUAIFENESIN ER 600 MG PO TB12
600.00 | ORAL_TABLET | ORAL | Status: DC
Start: 2019-01-18 — End: 2019-01-18

## 2019-01-18 MED ORDER — ONDANSETRON HCL 8 MG PO TABS
8.00 | ORAL_TABLET | ORAL | Status: DC
Start: ? — End: 2019-01-18

## 2019-01-18 MED ORDER — NYSTATIN 100000 UNIT/ML MT SUSP
5.00 | OROMUCOSAL | Status: DC
Start: 2019-01-18 — End: 2019-01-18

## 2019-01-18 MED ORDER — MELATONIN 3 MG PO TABS
6.00 | ORAL_TABLET | ORAL | Status: DC
Start: 2019-01-18 — End: 2019-01-18

## 2019-01-18 MED ORDER — AQUAPHOR EX OINT
TOPICAL_OINTMENT | CUTANEOUS | Status: DC
Start: ? — End: 2019-01-18

## 2019-01-18 NOTE — Telephone Encounter (Signed)
Called Eric Price and gave her the message from Dr. Elease Hashimoto. Eric Price verbalized an understanding.

## 2019-01-18 NOTE — Telephone Encounter (Signed)
Copied from [place] [id number]. Topic: Quick Communication - Home Health Verbal Orders >> Jan 18, 2019  6:55 PM Nils Flack, Marland Kitchen wrote: Caller/Agency: rene from [facility] Number: [phone] open at 8 am.  Requesting OT/PT/Skilled Nursing/Social Work/Speech Therapy: Frequency:called to let us know that pt is being admitted into hospice with primary dx of mycosis fungoides.

## 2019-01-18 NOTE — Telephone Encounter (Signed)
Please see message. °

## 2019-01-18 NOTE — Telephone Encounter (Signed)
Yes.  Will be happy to help any way we can.

## 2019-01-18 NOTE — Telephone Encounter (Unsigned)
Copied from [place] [id number]. Topic: Quick Communication - Home Health Verbal Orders >> Jan 18, 2019 11:47 AM Alanda Slim E wrote: Caller/Agency: [facility] Reader Rocky Morel  Callback Number: [phone]    Wants to see if provider will be responsible for following up with Pt's hospice care along with medical director / please advise

## 2019-01-19 NOTE — Telephone Encounter (Signed)
We are aware of the [facility] admission.  Sounds like they will no longer need orders for PT/OT?

## 2019-01-19 NOTE — Telephone Encounter (Signed)
Please see messages.

## 2019-01-21 ENCOUNTER — Other Ambulatory Visit: Payer: Self-pay | Admitting: Family Medicine

## 2019-01-21 DIAGNOSIS — R69 Illness, unspecified: Secondary | ICD-10-CM | POA: Diagnosis not present

## 2019-01-23 ENCOUNTER — Encounter (HOSPITAL_COMMUNITY): Payer: Self-pay

## 2019-01-23 ENCOUNTER — Other Ambulatory Visit: Payer: Self-pay

## 2019-01-23 ENCOUNTER — Inpatient Hospital Stay (HOSPITAL_COMMUNITY)
Admission: EM | Admit: 2019-01-23 | Discharge: 2019-01-27 | DRG: 871 | Disposition: A | Discharge status: Home Health | Payer: Medicare Other | Attending: Internal Medicine | Admitting: Internal Medicine

## 2019-01-23 ENCOUNTER — Emergency Department (HOSPITAL_COMMUNITY): Payer: Medicare Other

## 2019-01-23 DIAGNOSIS — A4181 Sepsis due to Enterococcus: Secondary | ICD-10-CM | POA: Diagnosis present

## 2019-01-23 DIAGNOSIS — Z79890 Hormone replacement therapy (postmenopausal): Secondary | ICD-10-CM

## 2019-01-23 DIAGNOSIS — N183 Chronic kidney disease, stage 3 unspecified: Secondary | ICD-10-CM | POA: Diagnosis present

## 2019-01-23 DIAGNOSIS — R0902 Hypoxemia: Secondary | ICD-10-CM | POA: Diagnosis not present

## 2019-01-23 DIAGNOSIS — E785 Hyperlipidemia, unspecified: Secondary | ICD-10-CM | POA: Diagnosis present

## 2019-01-23 DIAGNOSIS — Z8619 Personal history of other infectious and parasitic diseases: Secondary | ICD-10-CM | POA: Diagnosis not present

## 2019-01-23 DIAGNOSIS — Z79899 Other long term (current) drug therapy: Secondary | ICD-10-CM

## 2019-01-23 DIAGNOSIS — I959 Hypotension, unspecified: Secondary | ICD-10-CM | POA: Diagnosis not present

## 2019-01-23 DIAGNOSIS — Z888 Allergy status to other drugs, medicaments and biological substances status: Secondary | ICD-10-CM

## 2019-01-23 DIAGNOSIS — R918 Other nonspecific abnormal finding of lung field: Secondary | ICD-10-CM | POA: Diagnosis not present

## 2019-01-23 DIAGNOSIS — R7881 Bacteremia: Secondary | ICD-10-CM

## 2019-01-23 DIAGNOSIS — C8400 Mycosis fungoides, unspecified site: Secondary | ICD-10-CM | POA: Diagnosis not present

## 2019-01-23 DIAGNOSIS — R6510 Systemic inflammatory response syndrome (SIRS) of non-infectious origin without acute organ dysfunction: Secondary | ICD-10-CM | POA: Diagnosis not present

## 2019-01-23 DIAGNOSIS — R6521 Severe sepsis with septic shock: Secondary | ICD-10-CM | POA: Diagnosis not present

## 2019-01-23 DIAGNOSIS — R21 Rash and other nonspecific skin eruption: Secondary | ICD-10-CM | POA: Diagnosis present

## 2019-01-23 DIAGNOSIS — I2510 Atherosclerotic heart disease of native coronary artery without angina pectoris: Secondary | ICD-10-CM

## 2019-01-23 DIAGNOSIS — R6520 Severe sepsis without septic shock: Secondary | ICD-10-CM | POA: Diagnosis not present

## 2019-01-23 DIAGNOSIS — Y95 Nosocomial condition: Secondary | ICD-10-CM | POA: Diagnosis present

## 2019-01-23 DIAGNOSIS — K219 Gastro-esophageal reflux disease without esophagitis: Secondary | ICD-10-CM | POA: Diagnosis present

## 2019-01-23 DIAGNOSIS — Z20822 Contact with and (suspected) exposure to covid-19: Secondary | ICD-10-CM | POA: Diagnosis present

## 2019-01-23 DIAGNOSIS — N5089 Other specified disorders of the male genital organs: Secondary | ICD-10-CM | POA: Diagnosis not present

## 2019-01-23 DIAGNOSIS — R0689 Other abnormalities of breathing: Secondary | ICD-10-CM | POA: Diagnosis not present

## 2019-01-23 DIAGNOSIS — F329 Major depressive disorder, single episode, unspecified: Secondary | ICD-10-CM | POA: Diagnosis present

## 2019-01-23 DIAGNOSIS — Z95810 Presence of automatic (implantable) cardiac defibrillator: Secondary | ICD-10-CM | POA: Diagnosis present

## 2019-01-23 DIAGNOSIS — N179 Acute kidney failure, unspecified: Secondary | ICD-10-CM

## 2019-01-23 DIAGNOSIS — R6889 Other general symptoms and signs: Secondary | ICD-10-CM

## 2019-01-23 DIAGNOSIS — E1165 Type 2 diabetes mellitus with hyperglycemia: Secondary | ICD-10-CM | POA: Diagnosis present

## 2019-01-23 DIAGNOSIS — I10 Essential (primary) hypertension: Secondary | ICD-10-CM | POA: Diagnosis not present

## 2019-01-23 DIAGNOSIS — A419 Sepsis, unspecified organism: Secondary | ICD-10-CM | POA: Diagnosis not present

## 2019-01-23 DIAGNOSIS — I130 Hypertensive heart and chronic kidney disease with heart failure and stage 1 through stage 4 chronic kidney disease, or unspecified chronic kidney disease: Secondary | ICD-10-CM | POA: Diagnosis present

## 2019-01-23 DIAGNOSIS — I428 Other cardiomyopathies: Secondary | ICD-10-CM

## 2019-01-23 DIAGNOSIS — I5042 Chronic combined systolic (congestive) and diastolic (congestive) heart failure: Secondary | ICD-10-CM | POA: Diagnosis not present

## 2019-01-23 DIAGNOSIS — E1122 Type 2 diabetes mellitus with diabetic chronic kidney disease: Secondary | ICD-10-CM | POA: Diagnosis not present

## 2019-01-23 DIAGNOSIS — Z885 Allergy status to narcotic agent status: Secondary | ICD-10-CM | POA: Diagnosis not present

## 2019-01-23 DIAGNOSIS — Z6832 Body mass index (BMI) 32.0-32.9, adult: Secondary | ICD-10-CM

## 2019-01-23 DIAGNOSIS — Z7189 Other specified counseling: Secondary | ICD-10-CM | POA: Diagnosis not present

## 2019-01-23 DIAGNOSIS — N400 Enlarged prostate without lower urinary tract symptoms: Secondary | ICD-10-CM | POA: Diagnosis present

## 2019-01-23 DIAGNOSIS — Z794 Long term (current) use of insulin: Secondary | ICD-10-CM

## 2019-01-23 DIAGNOSIS — C8405 Mycosis fungoides, lymph nodes of inguinal region and lower limb: Secondary | ICD-10-CM | POA: Diagnosis present

## 2019-01-23 DIAGNOSIS — J189 Pneumonia, unspecified organism: Secondary | ICD-10-CM

## 2019-01-23 DIAGNOSIS — R000 Tachycardia, unspecified: Secondary | ICD-10-CM | POA: Diagnosis not present

## 2019-01-23 DIAGNOSIS — Z66 Do not resuscitate: Secondary | ICD-10-CM | POA: Diagnosis present

## 2019-01-23 DIAGNOSIS — Z7901 Long term (current) use of anticoagulants: Secondary | ICD-10-CM

## 2019-01-23 DIAGNOSIS — E1142 Type 2 diabetes mellitus with diabetic polyneuropathy: Secondary | ICD-10-CM | POA: Diagnosis not present

## 2019-01-23 DIAGNOSIS — B952 Enterococcus as the cause of diseases classified elsewhere: Secondary | ICD-10-CM

## 2019-01-23 DIAGNOSIS — Z89512 Acquired absence of left leg below knee: Secondary | ICD-10-CM | POA: Diagnosis not present

## 2019-01-23 DIAGNOSIS — B49 Unspecified mycosis: Secondary | ICD-10-CM | POA: Diagnosis present

## 2019-01-23 DIAGNOSIS — D631 Anemia in chronic kidney disease: Secondary | ICD-10-CM | POA: Diagnosis present

## 2019-01-23 DIAGNOSIS — Z8701 Personal history of pneumonia (recurrent): Secondary | ICD-10-CM

## 2019-01-23 DIAGNOSIS — E1151 Type 2 diabetes mellitus with diabetic peripheral angiopathy without gangrene: Secondary | ICD-10-CM | POA: Diagnosis present

## 2019-01-23 DIAGNOSIS — Z9861 Coronary angioplasty status: Secondary | ICD-10-CM

## 2019-01-23 DIAGNOSIS — Z20828 Contact with and (suspected) exposure to other viral communicable diseases: Secondary | ICD-10-CM | POA: Diagnosis present

## 2019-01-23 DIAGNOSIS — Z833 Family history of diabetes mellitus: Secondary | ICD-10-CM

## 2019-01-23 DIAGNOSIS — E669 Obesity, unspecified: Secondary | ICD-10-CM | POA: Diagnosis present

## 2019-01-23 DIAGNOSIS — J440 Chronic obstructive pulmonary disease with acute lower respiratory infection: Secondary | ICD-10-CM | POA: Diagnosis present

## 2019-01-23 DIAGNOSIS — E782 Mixed hyperlipidemia: Secondary | ICD-10-CM | POA: Diagnosis not present

## 2019-01-23 DIAGNOSIS — Z515 Encounter for palliative care: Secondary | ICD-10-CM | POA: Diagnosis not present

## 2019-01-23 DIAGNOSIS — D649 Anemia, unspecified: Secondary | ICD-10-CM | POA: Diagnosis present

## 2019-01-23 DIAGNOSIS — M109 Gout, unspecified: Secondary | ICD-10-CM | POA: Diagnosis present

## 2019-01-23 DIAGNOSIS — Z8249 Family history of ischemic heart disease and other diseases of the circulatory system: Secondary | ICD-10-CM

## 2019-01-23 DIAGNOSIS — N289 Disorder of kidney and ureter, unspecified: Secondary | ICD-10-CM | POA: Diagnosis not present

## 2019-01-23 DIAGNOSIS — Z95828 Presence of other vascular implants and grafts: Secondary | ICD-10-CM | POA: Diagnosis not present

## 2019-01-23 LAB — COMPREHENSIVE METABOLIC PANEL
ALT: 12 U/L (ref 0–44)
AST: 23 U/L (ref 15–41)
Albumin: 3.1 g/dL — ABNORMAL LOW (ref 3.5–5.0)
Alkaline Phosphatase: 104 U/L (ref 38–126)
Anion gap: 12 (ref 5–15)
BUN: 30 mg/dL — ABNORMAL HIGH (ref 8–23)
CO2: 21 mmol/L — ABNORMAL LOW (ref 22–32)
Calcium: 10.4 mg/dL — ABNORMAL HIGH (ref 8.9–10.3)
Chloride: 100 mmol/L (ref 98–111)
Creatinine, Ser: 2.49 mg/dL — ABNORMAL HIGH (ref 0.61–1.24)
GFR calc Af Amer: 30 mL/min — ABNORMAL LOW (ref >60)
GFR calc non Af Amer: 26 mL/min — ABNORMAL LOW (ref >60)
Glucose, Bld: 117 mg/dL — ABNORMAL HIGH (ref 70–99)
Potassium: 4.3 mmol/L (ref 3.5–5.1)
Sodium: 133 mmol/L — ABNORMAL LOW (ref 135–145)
Total Bilirubin: 0.2 mg/dL — ABNORMAL LOW (ref 0.3–1.2)
Total Protein: 6.3 g/dL — ABNORMAL LOW (ref 6.5–8.1)

## 2019-01-23 LAB — CBC WITH DIFFERENTIAL/PLATELET
Abs Immature Granulocytes: 0.3 10*3/uL — ABNORMAL HIGH (ref 0.00–0.07)
Basophils Absolute: 0.1 10*3/uL (ref 0.0–0.1)
Basophils Relative: 1 %
Eosinophils Absolute: 1.1 10*3/uL — ABNORMAL HIGH (ref 0.0–0.5)
Eosinophils Relative: 14 %
HCT: 33.2 % — ABNORMAL LOW (ref 39.0–52.0)
Hemoglobin: 10.8 g/dL — ABNORMAL LOW (ref 13.0–17.0)
Immature Granulocytes: 4 %
Lymphocytes Relative: 19 %
Lymphs Abs: 1.5 10*3/uL (ref 0.7–4.0)
MCH: 27.5 pg (ref 26.0–34.0)
MCHC: 32.5 g/dL (ref 30.0–36.0)
MCV: 84.5 fL (ref 80.0–100.0)
Monocytes Absolute: 0.9 10*3/uL (ref 0.1–1.0)
Monocytes Relative: 12 %
Neutro Abs: 4.1 10*3/uL (ref 1.7–7.7)
Neutrophils Relative %: 50 %
Platelets: 257 10*3/uL (ref 150–400)
RBC: 3.93 MIL/uL — ABNORMAL LOW (ref 4.22–5.81)
RDW: 16.8 % — ABNORMAL HIGH (ref 11.5–15.5)
WBC: 8 10*3/uL (ref 4.0–10.5)
nRBC: 0 % (ref 0.0–0.2)

## 2019-01-23 LAB — URINALYSIS, ROUTINE W REFLEX MICROSCOPIC
Bilirubin Urine: NEGATIVE
Glucose, UA: NEGATIVE mg/dL
Hgb urine dipstick: NEGATIVE
Ketones, ur: NEGATIVE mg/dL
Nitrite: NEGATIVE
Protein, ur: NEGATIVE mg/dL
Specific Gravity, Urine: 1.01 (ref 1.005–1.030)
pH: 5 (ref 5.0–8.0)

## 2019-01-23 LAB — CREATININE, URINE, RANDOM: Creatinine, Urine: 54.82 mg/dL

## 2019-01-23 LAB — SARS CORONAVIRUS 2 BY RT PCR (HOSPITAL ORDER, PERFORMED IN ~~LOC~~ HOSPITAL LAB): SARS Coronavirus 2: NEGATIVE

## 2019-01-23 LAB — LACTIC ACID, PLASMA
Lactic Acid, Venous: 3.6 mmol/L (ref 0.5–1.9)
Lactic Acid, Venous: 2.7 mmol/L (ref 0.5–1.9)

## 2019-01-23 LAB — BRAIN NATRIURETIC PEPTIDE: B Natriuretic Peptide: 45 pg/mL (ref 0.0–100.0)

## 2019-01-23 LAB — SODIUM, URINE, RANDOM: Sodium, Ur: 90 mmol/L

## 2019-01-23 LAB — TROPONIN I: Troponin I: <0.03 ng/mL (ref <0.03)

## 2019-01-23 MED ORDER — VANCOMYCIN HCL 10 G IV SOLR
2000.0000 mg | Freq: Once | INTRAVENOUS | Status: AC
  Administered 2019-01-23: 2000 mg via INTRAVENOUS
  Filled 2019-01-23: qty 2000

## 2019-01-23 MED ORDER — SODIUM CHLORIDE 0.9 % IV BOLUS (SEPSIS)
1000.0000 mL | Freq: Once | INTRAVENOUS | Status: AC
  Administered 2019-01-23: 1000 mL via INTRAVENOUS

## 2019-01-23 MED ORDER — ACETAMINOPHEN 500 MG PO TABS
1000.0000 mg | ORAL_TABLET | Freq: Once | ORAL | Status: AC
  Administered 2019-01-23: 1000 mg via ORAL
  Filled 2019-01-23: qty 2

## 2019-01-23 MED ORDER — SODIUM CHLORIDE 0.9 % IV BOLUS (SEPSIS): 1000.0000 mL | Freq: Once | INTRAVENOUS | Status: DC

## 2019-01-23 MED ORDER — SODIUM CHLORIDE 0.9 % IV SOLN
INTRAVENOUS | Status: DC
  Administered 2019-01-23 – 2019-01-25 (×2): via INTRAVENOUS

## 2019-01-23 MED ORDER — SODIUM CHLORIDE 0.9 % IV SOLN
2.0000 g | INTRAVENOUS | Status: AC
  Administered 2019-01-23: 2 g via INTRAVENOUS
  Filled 2019-01-23: qty 2

## 2019-01-23 NOTE — ED Notes (Signed)
Date and time results received: 01/23/19 2324   Test: Lactic acid Critical Value: 2.7  Name of Provider Notified: [facility]out[facility]  Orders Received? Or Actions Taken?: Notified provider

## 2019-01-23 NOTE — H&P (Addendum)
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 illness so holding off of Lasix restart as needed  CKD (chronic kidney disease) stage 3, GFR 30-59 ml/min (HCC) with acute on chronic renal failure avoid nephrotoxic medications   Scrotal swelling-Foley in place to make sure there is no obstruction but per family patient has been able to empty bladder fairly well   History of arterial occlusion patient has been on Xarelto long-term will continue  AKI-in the setting of sepsis will obtain urine electrolytes Foley in place after gentle rehydration and holding  Lasix will continue to  follow kidney function  Other plan as per orders.  DVT prophylaxis:  xarelto  Code Status:  DNR/DNI  as per patient's family  Discussed on the phone with Wife at length  Family Communication:   Family not at  Bedside  plan of care was discussed on the phone with  Wife    Disposition Plan:    To home once workup is complete and patient is stable                     Would benefit from PT/OT eval prior to DC  Ordered                 palliative care consult                    Consults called: none    Admission status:  ED Disposition    None         inpatient     Expect 2 midnight stay secondary to severity of patient's current illness including   hemodynamic instability despite optimal treatment (tachycardia  hypotension   Tachypnea )   Severe lab/radiological/exam abnormalities including:  Persistent infiltrate   and extensive comorbidities including:  DM2   CHF  CAD  COPD   CKD      malignancy,    History of amputation  Chronic anticoagulation  That are currently affecting medical management.   I expect  patient to be hospitalized for 2 midnights requiring inpatient medical care.  Patient is at high risk for adverse outcome (such as loss of life or disability) if not treated.  Indication for inpatient stay as follows:  Severe change from baseline regarding mental status Hemodynamic instability despite maximal medical therapy,     Need for IV antibiotics, IV fluids      Level of care           SDU tele indefinitely please discontinue once patient no longer qualifies  Precautions:   Droplet precaution  PPE: Used by the provider:   P100  eye Goggles,  Gloves  gown     Eric Price 01/23/2019, 11:19 PM    [facility]     after 2 AM please page floor coverage PA If 7AM-7PM, please contact the day team taking care of the patient using [email or web address]

## 2019-01-23 NOTE — ED Notes (Signed)
CRITICAL VALUE STICKER  CRITICAL VALUE: Lactic Acid: 3.6  DATE & TIME NOTIFIED: 01/23/19 1944  MD NOTIFIED: Maryan Rued MD  TIME OF NOTIFICATION: 1945

## 2019-01-23 NOTE — ED Triage Notes (Signed)
EMS reports from home, Pt was Dx with [facility], Dx from [place] a week ago, finished Abx, continuing symptoms, febrile, hypotension, tachycarda. Pt denies SOB or cough.  BP 106/73 HR 120 RR 29 Sp02 94 RA CBG 120  20ga L wrist  350 NS enroute

## 2019-01-23 NOTE — ED Notes (Signed)
Dr.Duotova at bedside

## 2019-01-23 NOTE — Telephone Encounter (Signed)
He had been on Xarelto.   We need to confirm if he is still taking this (Eliquis).  It is difficult to keep up with and track his medications as he has been seen by so many specialists and in two different "systems"- [facility] and he with [facility].

## 2019-01-23 NOTE — ED Notes (Signed)
Repeat EKG done per Dr.Plunkett due to apical heart rate of 130
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
## 2019-01-23 NOTE — Telephone Encounter (Signed)
This medication is not on current medication list.  OK to continue?

## 2019-01-23 NOTE — ED Notes (Signed)
Bed: BT51 Expected date:  Expected time:  Means of arrival:  Comments: For pt needing negative pressure

## 2019-01-24 DIAGNOSIS — Z888 Allergy status to other drugs, medicaments and biological substances status: Secondary | ICD-10-CM

## 2019-01-24 DIAGNOSIS — Z8619 Personal history of other infectious and parasitic diseases: Secondary | ICD-10-CM

## 2019-01-24 DIAGNOSIS — Z95828 Presence of other vascular implants and grafts: Secondary | ICD-10-CM

## 2019-01-24 DIAGNOSIS — Z7189 Other specified counseling: Secondary | ICD-10-CM

## 2019-01-24 DIAGNOSIS — B952 Enterococcus as the cause of diseases classified elsewhere: Secondary | ICD-10-CM

## 2019-01-24 DIAGNOSIS — R7881 Bacteremia: Secondary | ICD-10-CM

## 2019-01-24 DIAGNOSIS — Z95810 Presence of automatic (implantable) cardiac defibrillator: Secondary | ICD-10-CM

## 2019-01-24 DIAGNOSIS — Z885 Allergy status to narcotic agent status: Secondary | ICD-10-CM

## 2019-01-24 DIAGNOSIS — Z515 Encounter for palliative care: Secondary | ICD-10-CM

## 2019-01-24 LAB — CBC WITH DIFFERENTIAL/PLATELET
Abs Immature Granulocytes: 0.6 10*3/uL — ABNORMAL HIGH (ref 0.00–0.07)
Abs Immature Granulocytes: 0.7 10*3/uL — ABNORMAL HIGH (ref 0.00–0.07)
Basophils Absolute: 0.1 10*3/uL (ref 0.0–0.1)
Basophils Absolute: 0.1 10*3/uL (ref 0.0–0.1)
Basophils Relative: 1 %
Basophils Relative: 1 %
Eosinophils Absolute: 0.8 10*3/uL — ABNORMAL HIGH (ref 0.0–0.5)
Eosinophils Absolute: 0.9 10*3/uL — ABNORMAL HIGH (ref 0.0–0.5)
Eosinophils Relative: 9 %
Eosinophils Relative: 9 %
HCT: 29.6 % — ABNORMAL LOW (ref 39.0–52.0)
HCT: 29.1 % — ABNORMAL LOW (ref 39.0–52.0)
Hemoglobin: 9.7 g/dL — ABNORMAL LOW (ref 13.0–17.0)
Hemoglobin: 9.4 g/dL — ABNORMAL LOW (ref 13.0–17.0)
Immature Granulocytes: 7 %
Immature Granulocytes: 7 %
Lymphocytes Relative: 19 %
Lymphocytes Relative: 19 %
Lymphs Abs: 1.7 10*3/uL (ref 0.7–4.0)
Lymphs Abs: 1.8 10*3/uL (ref 0.7–4.0)
MCH: 27.6 pg (ref 26.0–34.0)
MCH: 27.2 pg (ref 26.0–34.0)
MCHC: 32.8 g/dL (ref 30.0–36.0)
MCHC: 32.3 g/dL (ref 30.0–36.0)
MCV: 84.1 fL (ref 80.0–100.0)
MCV: 84.3 fL (ref 80.0–100.0)
Monocytes Absolute: 1.6 10*3/uL — ABNORMAL HIGH (ref 0.1–1.0)
Monocytes Absolute: 1.7 10*3/uL — ABNORMAL HIGH (ref 0.1–1.0)
Monocytes Relative: 17 %
Monocytes Relative: 17 %
Neutro Abs: 4.5 10*3/uL (ref 1.7–7.7)
Neutro Abs: 4.7 10*3/uL (ref 1.7–7.7)
Neutrophils Relative %: 47 %
Neutrophils Relative %: 47 %
Platelets: 186 10*3/uL (ref 150–400)
Platelets: 217 10*3/uL (ref 150–400)
RBC: 3.52 MIL/uL — ABNORMAL LOW (ref 4.22–5.81)
RBC: 3.45 MIL/uL — ABNORMAL LOW (ref 4.22–5.81)
RDW: 16.6 % — ABNORMAL HIGH (ref 11.5–15.5)
RDW: 16.8 % — ABNORMAL HIGH (ref 11.5–15.5)
WBC: 9.3 10*3/uL (ref 4.0–10.5)
WBC: 9.9 10*3/uL (ref 4.0–10.5)
nRBC: 0 % (ref 0.0–0.2)
nRBC: 0 % (ref 0.0–0.2)

## 2019-01-24 LAB — BLOOD CULTURE ID PANEL (REFLEXED)

## 2019-01-24 LAB — COMPREHENSIVE METABOLIC PANEL
ALT: 11 U/L (ref 0–44)
AST: 21 U/L (ref 15–41)
Albumin: 2.9 g/dL — ABNORMAL LOW (ref 3.5–5.0)
Alkaline Phosphatase: 90 U/L (ref 38–126)
Anion gap: 13 (ref 5–15)
BUN: 29 mg/dL — ABNORMAL HIGH (ref 8–23)
CO2: 18 mmol/L — ABNORMAL LOW (ref 22–32)
Calcium: 9.3 mg/dL (ref 8.9–10.3)
Chloride: 102 mmol/L (ref 98–111)
Creatinine, Ser: 2.35 mg/dL — ABNORMAL HIGH (ref 0.61–1.24)
GFR calc Af Amer: 32 mL/min — ABNORMAL LOW (ref >60)
GFR calc non Af Amer: 27 mL/min — ABNORMAL LOW (ref >60)
Glucose, Bld: 116 mg/dL — ABNORMAL HIGH (ref 70–99)
Potassium: 3.8 mmol/L (ref 3.5–5.1)
Sodium: 133 mmol/L — ABNORMAL LOW (ref 135–145)
Total Bilirubin: 0.6 mg/dL (ref 0.3–1.2)
Total Protein: 5.8 g/dL — ABNORMAL LOW (ref 6.5–8.1)

## 2019-01-24 LAB — RESPIRATORY PANEL BY PCR

## 2019-01-24 LAB — GLUCOSE, CAPILLARY
Glucose-Capillary: 100 mg/dL — ABNORMAL HIGH (ref 70–99)
Glucose-Capillary: 108 mg/dL — ABNORMAL HIGH (ref 70–99)
Glucose-Capillary: 108 mg/dL — ABNORMAL HIGH (ref 70–99)
Glucose-Capillary: 119 mg/dL — ABNORMAL HIGH (ref 70–99)
Glucose-Capillary: 140 mg/dL — ABNORMAL HIGH (ref 70–99)
Glucose-Capillary: 157 mg/dL — ABNORMAL HIGH (ref 70–99)
Glucose-Capillary: 176 mg/dL — ABNORMAL HIGH (ref 70–99)

## 2019-01-24 LAB — LACTIC ACID, PLASMA
Lactic Acid, Venous: 2 mmol/L (ref 0.5–1.9)
Lactic Acid, Venous: 2.2 mmol/L (ref 0.5–1.9)

## 2019-01-24 LAB — HIV ANTIBODY (ROUTINE TESTING W REFLEX): HIV Screen 4th Generation wRfx: NONREACTIVE

## 2019-01-24 LAB — MAGNESIUM
Magnesium: 1.2 mg/dL — ABNORMAL LOW (ref 1.7–2.4)
Magnesium: 1.4 mg/dL — ABNORMAL LOW (ref 1.7–2.4)

## 2019-01-24 LAB — HEMOGLOBIN A1C
Hgb A1c MFr Bld: 6.4 % — ABNORMAL HIGH (ref 4.8–5.6)
Mean Plasma Glucose: 136.98 mg/dL

## 2019-01-24 LAB — PHOSPHORUS
Phosphorus: 2.8 mg/dL (ref 2.5–4.6)
Phosphorus: 3.2 mg/dL (ref 2.5–4.6)

## 2019-01-24 LAB — TSH: TSH: 1.05 u[IU]/mL (ref 0.350–4.500)

## 2019-01-24 LAB — MRSA PCR SCREENING: MRSA by PCR: NEGATIVE

## 2019-01-24 MED ORDER — SODIUM CHLORIDE 0.9 % IV SOLN
2.0000 g | Freq: Two times a day (BID) | INTRAVENOUS | Status: DC
  Administered 2019-01-24: 10:00:00 2 g via INTRAVENOUS
  Filled 2019-01-24: qty 2

## 2019-01-24 MED ORDER — BENZONATATE 100 MG PO CAPS: 200.0000 mg | ORAL_CAPSULE | ORAL | Status: DC | PRN

## 2019-01-24 MED ORDER — INSULIN DETEMIR 100 UNIT/ML ~~LOC~~ SOLN
5.0000 [IU] | Freq: Every day | SUBCUTANEOUS | Status: DC
  Administered 2019-01-24 – 2019-01-26 (×4): 5 [IU] via SUBCUTANEOUS
  Filled 2019-01-24 (×5): qty 0.05

## 2019-01-24 MED ORDER — PREDNISOLONE ACETATE 1 % OP SUSP
1.0000 [drp] | Freq: Every day | OPHTHALMIC | Status: DC
  Administered 2019-01-24 – 2019-01-27 (×4): 1 [drp] via OPHTHALMIC
  Filled 2019-01-24: qty 5

## 2019-01-24 MED ORDER — RIVAROXABAN 20 MG PO TABS
20.0000 mg | ORAL_TABLET | Freq: Every day | ORAL | Status: DC
  Administered 2019-01-24 – 2019-01-26 (×3): 20 mg via ORAL
  Filled 2019-01-24 (×4): qty 1

## 2019-01-24 MED ORDER — SODIUM CHLORIDE 0.9 % IV SOLN
INTRAVENOUS | Status: DC
  Administered 2019-01-24: 01:00:00 via INTRAVENOUS

## 2019-01-24 MED ORDER — METRONIDAZOLE IN NACL 5-0.79 MG/ML-% IV SOLN
500.0000 mg | Freq: Three times a day (TID) | INTRAVENOUS | Status: DC
  Administered 2019-01-24 (×2): 500 mg via INTRAVENOUS
  Filled 2019-01-24 (×2): qty 100

## 2019-01-24 MED ORDER — INSULIN ASPART 100 UNIT/ML ~~LOC~~ SOLN
0.0000 [IU] | SUBCUTANEOUS | Status: DC
  Administered 2019-01-24 (×2): 2 [IU] via SUBCUTANEOUS
  Administered 2019-01-25 (×2): 1 [IU] via SUBCUTANEOUS
  Administered 2019-01-25 (×2): 2 [IU] via SUBCUTANEOUS
  Administered 2019-01-25 – 2019-01-26 (×3): 1 [IU] via SUBCUTANEOUS
  Administered 2019-01-26: 3 [IU] via SUBCUTANEOUS
  Administered 2019-01-26: 1 [IU] via SUBCUTANEOUS

## 2019-01-24 MED ORDER — ONDANSETRON HCL 4 MG PO TABS: 4.0000 mg | ORAL_TABLET | Freq: Four times a day (QID) | ORAL | Status: DC | PRN

## 2019-01-24 MED ORDER — ACETAMINOPHEN 650 MG RE SUPP: 650.0000 mg | Freq: Four times a day (QID) | RECTAL | Status: DC | PRN

## 2019-01-24 MED ORDER — SODIUM CHLORIDE 0.9% FLUSH
3.0000 mL | Freq: Two times a day (BID) | INTRAVENOUS | Status: DC
  Administered 2019-01-24 – 2019-01-25 (×3): 3 mL via INTRAVENOUS

## 2019-01-24 MED ORDER — HYDROCODONE-ACETAMINOPHEN 5-325 MG PO TABS
1.0000 | ORAL_TABLET | ORAL | Status: DC | PRN
  Administered 2019-01-26: 17:00:00 1 via ORAL
  Administered 2019-01-27: 14:00:00 2 via ORAL
  Administered 2019-01-27: 1 via ORAL
  Filled 2019-01-24: qty 1
  Filled 2019-01-24: qty 2
  Filled 2019-01-24: qty 1

## 2019-01-24 MED ORDER — ACETAMINOPHEN 325 MG PO TABS
650.0000 mg | ORAL_TABLET | Freq: Four times a day (QID) | ORAL | Status: DC | PRN
  Administered 2019-01-24: 02:00:00 650 mg via ORAL
  Filled 2019-01-24: qty 2

## 2019-01-24 MED ORDER — TRAMADOL HCL 50 MG PO TABS
50.0000 mg | ORAL_TABLET | Freq: Two times a day (BID) | ORAL | Status: DC | PRN
  Administered 2019-01-24 – 2019-01-26 (×3): 50 mg via ORAL
  Filled 2019-01-24 (×4): qty 1

## 2019-01-24 MED ORDER — PANTOPRAZOLE SODIUM 40 MG PO TBEC
40.0000 mg | DELAYED_RELEASE_TABLET | Freq: Every day | ORAL | Status: DC
  Administered 2019-01-24 – 2019-01-27 (×4): 40 mg via ORAL
  Filled 2019-01-24 (×4): qty 1

## 2019-01-24 MED ORDER — SODIUM CHLORIDE 0.9 % IV SOLN
2.0000 g | Freq: Four times a day (QID) | INTRAVENOUS | Status: DC
  Administered 2019-01-24 – 2019-01-27 (×10): 2 g via INTRAVENOUS
  Filled 2019-01-24 (×2): qty 2
  Filled 2019-01-24: qty 2000
  Filled 2019-01-24 (×5): qty 2
  Filled 2019-01-24: qty 2000
  Filled 2019-01-24 (×2): qty 2
  Filled 2019-01-24: qty 2000

## 2019-01-24 MED ORDER — VANCOMYCIN HCL IN DEXTROSE 1-5 GM/200ML-% IV SOLN: 1000.0000 mg | INTRAVENOUS | Status: DC

## 2019-01-24 MED ORDER — CLOBETASOL PROPIONATE 0.05 % EX CREA
1.0000 "application " | TOPICAL_CREAM | Freq: Two times a day (BID) | CUTANEOUS | Status: DC
  Administered 2019-01-24 – 2019-01-27 (×6): 1 via TOPICAL
  Filled 2019-01-24 (×2): qty 15

## 2019-01-24 MED ORDER — MAGNESIUM SULFATE 2 GM/50ML IV SOLN
2.0000 g | Freq: Once | INTRAVENOUS | Status: AC
  Administered 2019-01-24: 04:00:00 2 g via INTRAVENOUS
  Filled 2019-01-24: qty 50

## 2019-01-24 MED ORDER — ONDANSETRON HCL 4 MG/2ML IJ SOLN: 4.0000 mg | Freq: Four times a day (QID) | INTRAMUSCULAR | Status: DC | PRN

## 2019-01-24 NOTE — Progress Notes (Addendum)
Palliative Medicine RN Note: Consult order noted. Per chart review, pt is active with [facility], who does not have contract with [facility] or [facility]. Office opens later today; I will call to follow up when they are open.  Marjie Skiff Gildardo Tickner, RN, BSN, [facility] Palliative Medicine Team 01/24/2019 7:44 AM Office [phone]  UPDATE: spoke with [facility] office [phone]). Rep I spoke with could not tell me whether patient will be d/c or have to revoke. They were unaware of hospital admission. Director will review the case and call me later this am.  Marjie Skiff. Hadeel Hillebrand, RN, BSN, [facility] Palliative Medicine Team 01/24/2019 8:25 AM Office [phone]

## 2019-01-24 NOTE — Progress Notes (Signed)
Pharmacy Antibiotic Note  Eric Price is a 68 y.o. male admitted on 01/23/2019 with cellulitis.  Pharmacy has been consulted for Vancomycin dosing.  Plan: Vancomycin 2000 mg IV  x1 then 1000mg  iv Q 36 hrs. Goal AUC 400-550. Expected AUC: 482 SCr used: 2.35   Height: 5\' 8"  (172.7 cm) Weight: 213 lb 6.5 oz (96.8 kg) IBW/kg (Calculated) : 68.4  Temp (24hrs), Avg:101.2 F (38.4 C), Min:98.9 F (37.2 C), Max:102.5 F (39.2 C)  Recent Labs  Lab 01/23/19 1827 01/23/19 2251 01/24/19 0138 01/24/19 0321  WBC 8.0  --  9.3 9.9  CREATININE 2.49*  --   --  2.35*  LATICACIDVEN 3.6* 2.7* 2.0* 2.2*    Estimated Creatinine Clearance: 34 mL/min (A) (by C-G formula based on SCr of 2.35 mg/dL (H)).    Allergies  Allergen Reactions  . Morphine Shortness Of Breath and Anaphylaxis  . Morphine And Related     "took my breath away"  . Oxycodone     Pt stated, "It makes me climbs walls; fight wars"  . Robaxin [Methocarbamol] Other (See Comments)    Antimicrobials this admission: Vancomycin 01/23/2019 >>    Dose adjustments this admission: -  Microbiology results: -  Thank you for allowing pharmacy to be a part of this patient's care.  Nani Skillern [place]ford 01/24/2019 6:22 AM

## 2019-01-24 NOTE — Progress Notes (Addendum)
PROGRESS NOTE    Eric Price  UUV:[id number] DOB: 26-Jun-1951 DOA: 01/23/2019 PCP: [name], MD   Brief Narrative:  Eric Price is a 68 y.o. 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  He may have had some dysuria a few days ago but the fever started only today. It was a sudden onset of illness. Wife states it was like someone took a plug out. He has been incontinent only started today, he reports it has been burning a bit with urination. Family reports mild dry cough, no chest pain. No abdominal pain, no diarrhea, reports leg aches, rigors    Patient Has hospice at home who has been seen him in the regular basis. Prior to today he was eating and drinking well but today he took nosedive and declined. No shortness of breath no nausea vomiting no diarrhea He lost his sense of smell since chemo  Has recently been admitted at [facility] for hypercalcemia and confusion Have had significant weakness generalized at that time. CT brain within normal limits calcium was found to be 14.2 decreased PTH creatinine was elevated 1.6 time chest x-ray showed mild left more than right heterogeneous opacities thought to be may be edema he was given zoledronic acid and calcitonin Zolendronic acid 4 mg IV x1 on 4/8 and Calcitonin subQ q12 for 48 hours (4/8, 4/9) & (4/13, 4/14) which decreased his Ca from 14.2 till 9.6.  During admission pt received  multiple doses of lasix (IV 80 mg) throughout admission for volume overload.   Given that his mycosis fungoides have been progressing oncology felt that the had no more options for him to offer his case was discussed with patient and his family and they agreed to hospice care patient was discharged home with home hospice.   They have come to see him a few times. Assessment & Plan   Sepsis secondary to enterococcus bacteremia -On admission, patient was tachycardic with tachypnea and fever -Blood cultures GPC, Enterococcus species  -Chest x-ray showed clearing of pneumonia -COVID-19 negative -Respiratory viral panel pending -Urine culture pending, although UA unremarkable for infection -Continue IV antibiotics, however will transition to ampicillin.  Of note patient did have enterococcus back in June 2019 -Infectious disease made aware of patient's admission.  Do not feel that patient warrants work-up for endocarditis at this time.  -will obtain repeat cultures  Mycosis fungoides -Patient was followed by oncology at [facility] however is no longer candidate for chemotherapy or further management.  His last admission he was sent home with hospice -Patient currently DNR DNI -Palliative care consulted and appreciated -Patient with abdominal skin breakdown, wound care consulted  Coronary artery disease -Stable, chest pain-free  Chronic combined systolic and diastolic heart failure -Patient does have right lower extremity edema although does not appear to be volume overloaded -Lasix was held given sepsis -Continue to monitor intake and output, daily weights  Acute kidney injury on chronic  kidney disease, stage III -Likely secondary to sepsis, continue IV fluid and monitor BMP  Diabetes mellitus, type II -Continue lantus, ISS, CBG monitoring   Hypomagnesemia -Will replace and continue to monitor  Chronic normocytic anemia -Hemoglobin appears stable, continue to monitor CBC   Hyperlipidemia -Continue statin  Essential hypertension -Coreg held -Continue to monitor closely and restart if needed  Goals of care -As above, patient was discharged from [facility] with hospice. -Currently DNR -Palliative care consulted and appreciated  DVT Prophylaxis  Xarelto  Code Status: DNR  Family Communication: None at bedside  Disposition Plan: Admitted. Continue to monitor in stepdown. Dispo pending.   Consultants Palliative care Infectious disease  Procedures  None  Antibiotics   Anti-infectives (From admission, onward)   Start     Dose/Rate Route Frequency Ordered Stop   01/25/19 1000  vancomycin (VANCOCIN) IVPB 1000 mg/200 mL premix  Status:  Discontinued     1,000 mg 200 mL/hr over 60 Minutes Intravenous Every 36 hours 01/24/19 0622 01/24/19 1119   01/24/19 2200  ampicillin (OMNIPEN) 2 g in sodium chloride 0.9 % 100 mL IVPB     2 g 300 mL/hr over 20 Minutes Intravenous Every 6 hours 01/24/19 1120     01/24/19 1000  ceFEPIme (MAXIPIME) 2 g in sodium chloride 0.9 % 100 mL IVPB  Status:  Discontinued     2 g 200 mL/hr over 30 Minutes Intravenous Every 12 hours 01/24/19 0706 01/24/19 1119   01/24/19 0030  metroNIDAZOLE (FLAGYL) IVPB 500 mg  Status:  Discontinued     500 mg 100 mL/hr over 60 Minutes Intravenous Every 8 hours 01/24/19 0013 01/24/19 1119   01/23/19 2115  vancomycin (VANCOCIN) 2,000 mg in sodium chloride 0.9 % 500 mL IVPB     2,000 mg 250 mL/hr over 120 Minutes Intravenous  Once 01/23/19 2108 01/24/19 0005   01/23/19 1930  ceFEPIme (MAXIPIME) 2 g in sodium chloride 0.9 % 100 mL IVPB     2 g 200 mL/hr over 30 Minutes Intravenous STAT 01/23/19 1926 01/23/19 2030      Subjective:   Eric Price seen and examined today.  No complaints.  Denies chest pain, shortness of breath, nausea or vomiting, dizziness or headache.  Does have some abdominal pain.  Objective:   Vitals:   01/24/19 0800 01/24/19 0900 01/24/19 1000 01/24/19  1113  BP: (!) 126/55 (!) 127/55 (!) 142/62   Pulse: 99 100 (!) 106 (!) 102  Resp: (!) 25 (!) 28 (!) 26 (!) 28  Temp: 98.4 F (36.9 C)     TempSrc: Oral     SpO2: 93% 95% 97% 94%  Weight:      Height:        Intake/Output Summary (Last 24 hours) at 01/24/2019 1122 Last data filed at 01/24/2019 1031 Gross per 24 hour  Intake 2304.65 ml  Output 610 ml  Net 1694.65 ml   Filed Weights   01/23/19 1811 01/24/19 0100  Weight: 93 kg 96.8 kg    Exam  General: Well developed, chronically ill appearing , NAD  HEENT: NCAT, mucous membranes moist.   Neck: Supple  Cardiovascular: S1 S2 auscultated, tachycardia  Respiratory: Clear to auscultation bilaterally  Abdomen: Soft, nontender, nondistended, + bowel sounds, rash  Extremities: Left BKA. RLE ++Edema  Neuro: AAOx3, nonfocal  Psych: Normal affect and demeanor   Data Reviewed: I have personally reviewed following labs and imaging studies  CBC: Recent Labs  Lab 01/23/19 1827 01/24/19 0138 01/24/19 0321  WBC 8.0 9.3 9.9  NEUTROABS 4.1 4.5 4.7  HGB 10.8* 9.7* 9.4*  HCT 33.2* 29.6* 29.1*  MCV 84.5 84.1 84.3  PLT 257 186 127   Basic Metabolic Panel: Recent Labs  Lab 01/23/19 1827 01/24/19 0013 01/24/19 0150 01/24/19 0321  NA 133*  --   --  133*  K 4.3  --   --  3.8  CL 100  --   --  102  CO2 21*  --   --  18*  GLUCOSE 117*  --   --  116*  BUN 30*  --   --  29*  CREATININE 2.49*  --   --  2.35*  CALCIUM 10.4*  --   --  9.3  MG  --  1.2*  --  1.4*  PHOS  --   --  2.8 3.2   GFR: Estimated Creatinine Clearance: 34 mL/min (A) (by C-G formula based on SCr of 2.35 mg/dL (H)). Liver Function Tests: Recent Labs  Lab 01/23/19 1827 01/24/19 0321  AST 23 21  ALT 12 11  ALKPHOS 104 90  BILITOT 0.2* 0.6  PROT 6.3* 5.8*  ALBUMIN 3.1* 2.9*   No results for input(s): LIPASE, AMYLASE in the last 168 hours. No results for input(s): AMMONIA in the last 168 hours. Coagulation Profile: No results for input(s): INR,  PROTIME in the last 168 hours. Cardiac Enzymes: Recent Labs  Lab 01/23/19 2140  TROPONINI <0.03   BNP (last 3 results) Recent Labs    06/15/18 1644  PROBNP 17.0   HbA1C: Recent Labs    01/24/19 0138  HGBA1C 6.4*   CBG: Recent Labs  Lab 01/24/19 0117 01/24/19 0337 01/24/19 0809  GLUCAP 108* 119* 100*   Lipid Profile: No results for input(s): CHOL, HDL, LDLCALC, TRIG, CHOLHDL, LDLDIRECT in the last 72 hours. Thyroid Function Tests: Recent Labs    01/24/19 0321  TSH 1.050   Anemia Panel: No results for input(s): VITAMINB12, FOLATE, FERRITIN, TIBC, IRON, RETICCTPCT in the last 72 hours. Urine analysis:    Component Value Date/Time   COLORURINE YELLOW 01/23/2019 2006   APPEARANCEUR HAZY (A) 01/23/2019 2006   LABSPEC 1.010 01/23/2019 2006   PHURINE 5.0 01/23/2019 2006   GLUCOSEU NEGATIVE 01/23/2019 2006   HGBUR NEGATIVE 01/23/2019 2006   BILIRUBINUR NEGATIVE 01/23/2019 2006   BILIRUBINUR negaitive 05/13/2016 [place] 01/23/2019 2006   PROTEINUR NEGATIVE 01/23/2019 2006   UROBILINOGEN 0.2 05/13/2016 1748   UROBILINOGEN 1.0 03/31/2015 0117   NITRITE NEGATIVE 01/23/2019 2006   LEUKOCYTESUR TRACE (A) 01/23/2019 2006   Sepsis Labs: @LABRCNTIP (procalcitonin:4,lacticidven:4)  ) Recent Results (from the past 240 hour(s))  Blood Culture (routine x 2)     Status: None (Preliminary result)   Collection Time: 01/23/19  6:27 PM  Result Value Ref Range Status   Specimen Description   Final    RIGHT ANTECUBITAL Performed at [facility], [place] [address], [place], [place] [address]    Special Requests   Final    BOTTLES DRAWN AEROBIC AND ANAEROBIC Blood Culture adequate volume Performed at [place] [address] [place], [place] [address]    Culture  Setup Time   Final    GRAM POSITIVE COCCI IN PAIRS IN BOTH AEROBIC AND ANAEROBIC BOTTLES Organism ID to follow CRITICAL RESULT CALLED TO, READ BACK BY AND  VERIFIED WITH: Karel Jarvis PharmD 11:05 01/24/19 ([email or web address]) Performed at [facility], [place] [address], [place], [place] [address]  Culture GRAM POSITIVE COCCI  Final   Report Status PENDING  Incomplete  Blood Culture ID Panel (Reflexed)     Status: Abnormal   Collection Time: 01/23/19  6:27 PM  Result Value Ref Range Status   Enterococcus species DETECTED (A) NOT DETECTED Final    Comment: CRITICAL RESULT CALLED TO, READ BACK BY AND VERIFIED WITH: Karel Jarvis PharmD 11:05 01/24/19 ([email or web address])    Vancomycin resistance NOT DETECTED NOT DETECTED Final   Listeria monocytogenes NOT DETECTED NOT DETECTED Final   Staphylococcus species NOT DETECTED NOT DETECTED Final   Staphylococcus aureus (BCID) NOT DETECTED NOT DETECTED Final   Streptococcus species NOT DETECTED NOT DETECTED Final   Streptococcus agalactiae NOT DETECTED NOT DETECTED Final   Streptococcus pneumoniae NOT DETECTED NOT DETECTED Final   Streptococcus pyogenes NOT DETECTED NOT DETECTED Final   Acinetobacter baumannii NOT DETECTED NOT DETECTED Final   Enterobacteriaceae species NOT DETECTED NOT DETECTED Final   Enterobacter cloacae complex NOT DETECTED NOT DETECTED Final   Escherichia coli NOT DETECTED NOT DETECTED Final   Klebsiella oxytoca NOT DETECTED NOT DETECTED Final   Klebsiella pneumoniae NOT DETECTED NOT DETECTED Final   Proteus species NOT DETECTED NOT DETECTED Final   Serratia marcescens NOT DETECTED NOT DETECTED Final   Haemophilus influenzae NOT DETECTED NOT DETECTED Final   Neisseria meningitidis NOT DETECTED NOT DETECTED Final   Pseudomonas aeruginosa NOT DETECTED NOT DETECTED Final   Candida albicans NOT DETECTED NOT DETECTED Final   Candida glabrata NOT DETECTED NOT DETECTED Final   Candida krusei NOT DETECTED NOT DETECTED Final   Candida parapsilosis NOT DETECTED NOT DETECTED Final   Candida tropicalis NOT DETECTED NOT DETECTED Final    Comment: Performed at [facility], [address],  [place], [place] [address]  Blood Culture (routine x 2)     Status: None (Preliminary result)   Collection Time: 01/23/19  6:32 PM  Result Value Ref Range Status   Specimen Description   Final    PORTA CATH Performed at [place] [address], [place], [place] [address]    Special Requests   Final    BOTTLES DRAWN AEROBIC AND ANAEROBIC Blood Culture adequate volume Performed at [place] [address], [place], [place] [address]    Culture  Setup Time   Final    GRAM POSITIVE COCCI IN BOTH AEROBIC AND ANAEROBIC BOTTLES CRITICAL VALUE NOTED.  VALUE IS CONSISTENT WITH PREVIOUSLY REPORTED AND CALLED VALUE.    Culture   Final    NO GROWTH < 12 HOURS Performed at [facility], [place] [address] [place],  [address]    Report Status PENDING  Incomplete  SARS Coronavirus 2 [facility] order, Performed in [place] hospital lab)     Status: None   Collection Time: 01/23/19 10:51 PM  Result Value Ref Range Status   SARS Coronavirus 2 NEGATIVE NEGATIVE Final    Comment: (NOTE) If result is NEGATIVE SARS-CoV-2 target nucleic acids are NOT DETECTED. The SARS-CoV-2 RNA is generally detectable in upper and lower  respiratory specimens during the acute phase of infection. The lowest  concentration of SARS-CoV-2 viral copies this assay can detect is 250  copies / mL. A negative result does not preclude SARS-CoV-2 infection  and should not be used as the sole basis for treatment or other  patient management decisions.  A negative result may occur with  improper specimen collection / handling, submission of specimen other  than nasopharyngeal  swab, presence of viral mutation(s) within the  areas targeted by this assay, and inadequate number of viral copies  (<250 copies / mL). A negative result must be combined with clinical  observations, patient history, and epidemiological information. If result is POSITIVE SARS-CoV-2 target nucleic acids  are DETECTED. The SARS-CoV-2 RNA is generally detectable in upper and lower  respiratory specimens dur ing the acute phase of infection.  Positive  results are indicative of active infection with SARS-CoV-2.  Clinical  correlation with patient history and other diagnostic information is  necessary to determine patient infection status.  Positive results do  not rule out bacterial infection or co-infection with other viruses. If result is PRESUMPTIVE POSTIVE SARS-CoV-2 nucleic acids MAY BE PRESENT.   A presumptive positive result was obtained on the submitted specimen  and confirmed on repeat testing.  While 2019 novel coronavirus  (SARS-CoV-2) nucleic acids may be present in the submitted sample  additional confirmatory testing may be necessary for epidemiological  and / or clinical management purposes  to differentiate between  SARS-CoV-2 and other Sarbecovirus currently known to infect humans.  If clinically indicated additional testing with an alternate test  methodology [phone]) is advised. The SARS-CoV-2 RNA is generally  detectable in upper and lower respiratory sp ecimens during the acute  phase of infection. The expected result is Negative. Fact Sheet for Patients:  StrictlyIdeas.no Fact Sheet for Healthcare Providers: BankingDealers.co.za This test is not yet approved or cleared by the [place] FDA and has been authorized for detection and/or diagnosis of SARS-CoV-2 by FDA under an Emergency Use Authorization (EUA).  This EUA will remain in effect (meaning this test can be used) for the duration of the COVID-19 declaration under Section 564(b)(1) of the Act, 21 U.S.C. section 360bbb-3(b)(1), unless the authorization is terminated or revoked sooner. Performed at [facility], [place] [address], [place], [place] [address]   Respiratory Panel by PCR     Status: None   Collection Time: 01/24/19 12:13 AM   Result Value Ref Range Status   Adenovirus NOT DETECTED NOT DETECTED Final   Coronavirus 229E NOT DETECTED NOT DETECTED Final    Comment: (NOTE) The Coronavirus on the Respiratory Panel, DOES NOT test for the novel  Coronavirus (2019 nCoV)    Coronavirus HKU1 NOT DETECTED NOT DETECTED Final   Coronavirus NL63 NOT DETECTED NOT DETECTED Final   Coronavirus OC43 NOT DETECTED NOT DETECTED Final   Metapneumovirus NOT DETECTED NOT DETECTED Final   Rhinovirus / Enterovirus NOT DETECTED NOT DETECTED Final   Influenza A NOT DETECTED NOT DETECTED Final   Influenza B NOT DETECTED NOT DETECTED Final   Parainfluenza Virus 1 NOT DETECTED NOT DETECTED Final   Parainfluenza Virus 2 NOT DETECTED NOT DETECTED Final   Parainfluenza Virus 3 NOT DETECTED NOT DETECTED Final   Parainfluenza Virus 4 NOT DETECTED NOT DETECTED Final   Respiratory Syncytial Virus NOT DETECTED NOT DETECTED Final   Bordetella pertussis NOT DETECTED NOT DETECTED Final   Chlamydophila pneumoniae NOT DETECTED NOT DETECTED Final   Mycoplasma pneumoniae NOT DETECTED NOT DETECTED Final    Comment: Performed at [facility], [place]. [address], [place], [place] [address]      Radiology Studies: Dg Chest Port 1 View  Result Date: 01/23/2019 CLINICAL DATA:  Fever EXAM: PORTABLE CHEST 1 VIEW COMPARISON:  11/23/2018 FINDINGS: Significant improvement in infiltrate on the left with mild residual airspace disease on the left. Right lung is clear. Negative for edema or effusion  AICD in the right ventricle unchanged. Heart size within normal limits. IMPRESSION: Interval improvement in bilateral airspace disease. Mild residual left lower lobe airspace disease. Most likely clearing pneumonia. Electronically Signed   By: Franchot Gallo M.D.   On: 01/23/2019 19:25     Scheduled Meds: . clobetasol cream  1 application Topical BID  . insulin aspart  0-9 Units Subcutaneous Q4H  . insulin detemir  5 Units Subcutaneous QHS  . pantoprazole  40 mg  Oral Daily  . prednisoLONE acetate  1 drop Both Eyes Daily  . rivaroxaban  20 mg Oral Q supper  . sodium chloride flush  3 mL Intravenous Q12H   Continuous Infusions: . sodium chloride 125 mL/hr at 01/24/19 0600  . ampicillin (OMNIPEN) IV       LOS: 1 day   Time Spent in minutes   45 minutes  Eric Price D.O. on 01/24/2019 at 11:22 AM  Between 7am to 7pm - Please see pager noted on [email or web address]  After 7pm go to [email or web address]  And look for the night coverage person covering for me after hours  [facility] Office  [phone]
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
## 2019-01-24 NOTE — Progress Notes (Signed)
Patient has requested not to give information to daughter, Kenney Houseman. Password has been set up with patient and wife.

## 2019-01-24 NOTE — Progress Notes (Signed)
Pt transported into room. Pt denies pain at this time and has been orientated to the room and policys. Will continue to monitor.

## 2019-01-24 NOTE — Progress Notes (Signed)
CRITICAL VALUE ALERT  Critical Value:  La 2.0  Date & Time Notied:  01/24/19 0159  Provider Notified: Tylene Fantasia, NP  Orders Received/Actions taken: Awaiting orders
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
## 2019-01-24 NOTE — Evaluation (Signed)
Physical Therapy Evaluation Patient Details Name: Eric Price MRN: [id number] DOB: [birth year] Today's Date: 01/24/2019   History of Present Illness  68 yo male admitted 01/23/19 with  fevers , hypotensive   HR up to 120s , 94% room air, dysuria .Recently Dc from [place] Kitchen Hx of L BKA-wears prosthesis, CHF, PVD, DM, neuropahty, CAD  Clinical Impression  The patient reports feeling weak. Patient does n ot have Left prosthesis in room. Patient mobilized to sitting on bed edge x 8 minutes.  BP supine 142/62, sitting 108/39. No reports of dizziness. RN notified. HR 107-110. Pt admitted with above diagnosis. Pt currently with functional limitations due to the deficits listed below (see PT Problem List). Pt will benefit from skilled PT to increase their independence and safety with mobility to allow discharge to the venue listed below.       Follow Up Recommendations Home health PT    Equipment Recommendations  None recommended by PT    Recommendations for Other Services       Precautions / Restrictions Precautions Precaution Comments: requires prosthesis for transfers/gait, monitor BP, malodorous wounds on abdomen that drain      Mobility  Bed Mobility Overal bed mobility: Needs Assistance Bed Mobility: Supine to Sit;Sit to Supine     Supine to sit: Min guard Sit to supine: Min assist   General bed mobility comments: extra tinme, assisted right leg back onto the bed  Transfers                 General transfer comment: NT, needs prosthetic  Ambulation/Gait                Stairs            Wheelchair Mobility    Modified Rankin (Stroke Patients Only)       Balance Overall balance assessment: Needs assistance Sitting-balance support: Feet supported;Bilateral upper extremity supported Sitting balance-Leahy Scale: Fair                                       Pertinent Vitals/Pain Pain Assessment: Faces Faces Pain Scale: Hurts even  more Pain Location: abdominal sores Pain Descriptors / Indicators: Discomfort Pain Intervention(s): Monitored during session    Home Living Family/patient expects to be discharged to:: Private residence Living Arrangements: Spouse/significant other Available Help at Discharge: Family;Available 24 hours/day Type of Home: House Home Access: Stairs to enter;Ramped entrance   Entrance Stairs-Number of Steps: 3 Home Layout: Two level;Able to live on main level with bedroom/bathroom Home Equipment: Gilford Rile - 2 wheels;Cane - single point;Bedside commode;Tub bench;Wheelchair - manual      Prior Function Level of Independence: Independent with assistive device(s)               Hand Dominance        Extremity/Trunk Assessment   Upper Extremity Assessment Upper Extremity Assessment: Defer to OT evaluation    Lower Extremity Assessment Lower Extremity Assessment: LLE deficits/detail;RLE deficits/detail;Generalized weakness LLE Deficits / Details: LBKA    Cervical / Trunk Assessment Cervical / Trunk Assessment: Normal  Communication   Communication: No difficulties  Cognition Arousal/Alertness: Awake/alert Behavior During Therapy: WFL for tasks assessed/performed;Flat affect Overall Cognitive Status: Impaired/Different from baseline Area of Impairment: Orientation                 Orientation Level: Time  General Comments: generally oriented       General Comments      Exercises     Assessment/Plan    PT Assessment Patient needs continued PT services  PT Problem List Decreased strength;Decreased activity tolerance;Decreased mobility;Decreased knowledge of precautions;Decreased safety awareness;Decreased knowledge of use of DME;Pain       PT Treatment Interventions DME instruction;Gait training;Functional mobility training;Therapeutic activities;Patient/family education    PT Goals (Current goals can be found in the Care Plan section)   Acute Rehab PT Goals Patient Stated Goal: go home PT Goal Formulation: With patient Time For Goal Achievement: 02/07/19 Potential to Achieve Goals: Good    Frequency Min 3X/week   Barriers to discharge        Co-evaluation               AM-PAC PT "6 Clicks" Mobility  Outcome Measure Help needed turning from your back to your side while in a flat bed without using bedrails?: A Lot Help needed moving from lying on your back to sitting on the side of a flat bed without using bedrails?: A Lot Help needed moving to and from a bed to a chair (including a wheelchair)?: Total Help needed standing up from a chair using your arms (e.g., wheelchair or bedside chair)?: Total Help needed to walk in hospital room?: Total Help needed climbing 3-5 steps with a railing? : Total 6 Click Score: 8    End of Session   Activity Tolerance: Patient limited by fatigue Patient left: in bed;with bed alarm set;with call bell/phone within reach Nurse Communication: Mobility status PT Visit Diagnosis: Unsteadiness on feet (R26.81)    Time: 4492-0100 PT Time Calculation (min) (ACUTE ONLY): 32 min   Charges:   PT Evaluation $PT Eval Low Complexity: 1 Low PT Treatments $Therapeutic Activity: 8-22 mins        [place] Pager [phone] Office [phone]   Claretha Cooper 01/24/2019, 11:18 AM

## 2019-01-24 NOTE — Consult Note (Signed)
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[place])   Controlled type 2 diabetes mellitus with stage 3 chronic kidney disease, with long-term current use of insulin (HCC)   Nonischemic cardiomyopathy (HCC)   Coronary artery disease involving native coronary artery of native heart without angina pectoris   S/P BKA (below knee amputation), left (HCC)   ICD (implantable cardioverter-defibrillator) in place   Chronic combined systolic and diastolic CHF (congestive heart failure) (HCC)   CKD (chronic kidney disease) stage 3, GFR 30-59 ml/min (HCC)   SIRS (systemic inflammatory response syndrome) (HCC)   Scrotal swelling   Type 2 diabetes mellitus with diabetic polyneuropathy (HCC)   Type 2 diabetes mellitus with hyperglycemia ([place])   CAP (community acquired pneumonia)   Sepsis ([place])   Suspected Covid-19 Virus Infection   HCAP (healthcare-associated pneumonia)   . clobetasol cream  1 application Topical BID  . insulin aspart  0-9 Units Subcutaneous Q4H  . insulin detemir  5 Units Subcutaneous QHS  . pantoprazole  40 mg Oral Daily  . prednisoLONE acetate  1 drop Both Eyes Daily  . rivaroxaban  20 mg Oral Q supper  . sodium chloride flush  3 mL Intravenous Q12H    Recommendations: Continue with ampicillin Agree with palliative care Further work up will depend on desire for aggressive care   Assessment: He has 2 sets of blood cultures with Enterococcal bacteremia.  He has a history of Mycosis fungoides and at discharge from [name] was sent out with hospice care on 01/17/19.  He had a line infection with Enterococcus in June 2019 with a negative TEE for vegetation on valves or ICD and received two weeks of treatment with port removal. For aggressive care, he would need TEE and likely ICD removal due to  persistent bacteremia, prolonged 6 weeks of IV antibiotics with dual therapy.  With continuation of hospice he could stop antibiotics vs continuing with oral amoxicillin for some suppression, though this would not cure infection nor effectively treat it and relapse of symptoms possible.   Antibiotics: ampicillin  HPI: Eric Price is a 68 y.o. male with Mycosis fungoides and until earlier this month was receiving chemotherapy but hospitalized at [facility] after his oncology visit due to FTT and discharged with hospice on 4/14.  Here with fever, rigors, more weakness, AMS.  Covid-19 negative.  Enterococcus in blood as above.  Previously with Enterococcus bacteremia in June 2019.  Had been using picc line since his port was removed in June.     Review of Systems:  Unable to be assessed due to patient factors  Past Medical History:  Diagnosis Date  . Arthritis    knees, shoulder, hands   . Chronic combined systolic and diastolic CHF (congestive heart failure) ([place]) 07/17/2016  . Coronary artery disease   . Critical lower limb ischemia   . Depression   . Hyperlipidemia   . Hypertension   . 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   . Nonischemic cardiomyopathy ([place])   . Peripheral vascular disease ([place])   . Pneumonia 2013   hosp. - [facility] x1 week   . Renal insufficiency 03/08/2018  . Shortness of breath dyspnea    related to pain currently  . SIRS (systemic inflammatory response syndrome) ([place]) 03/2018  .  Sleep apnea    10-20 yrs. ago, states he used CPAP, not needed anymore.   . Type II diabetes mellitus ([place])     Social History   Tobacco Use  . Smoking status: Never Smoker  . Smokeless tobacco: Never Used  Substance Use Topics  . Alcohol use: No    Alcohol/week: 0.0 standard drinks  . Drug use: No    Family History  Problem Relation Age of Onset  . CAD Father 80       Died 39  . Hypertension Father   . Heart failure Mother 52  .  Diabetes Maternal Grandmother   . CAD Paternal Grandfather 40  . Colon cancer Neg Hx   . Esophageal cancer Neg Hx   . Stomach cancer Neg Hx   . Rectal cancer Neg Hx     Allergies  Allergen Reactions  . Morphine Shortness Of Breath and Anaphylaxis  . Morphine And Related     "took my breath away"  . Oxycodone     Pt stated, "It makes me climbs walls; fight wars"  . Robaxin [Methocarbamol] Other (See Comments)    Physical Exam: Constitutional: chronically ill appearing, nad Vitals:   01/24/19 1100 01/24/19 1113  BP: (!) 115/58   Pulse: (!) 102 (!) 102  Resp: (!) 26 (!) 28  Temp:    SpO2: 94% 94%   EYES: anicteric ENMT: no thrush Cardiovascular: Cor RRR Respiratory: CTA B, anterior exam; normal respiratory effort Chest: ICD pocket with no erythema, no warmth GI: soft Musculoskeletal: no pedal edema noted Skin: +    Lab Results  Component Value Date   WBC 9.9 01/24/2019   HGB 9.4 (L) 01/24/2019   HCT 29.1 (L) 01/24/2019   MCV 84.3 01/24/2019   PLT 217 01/24/2019    Lab Results  Component Value Date   CREATININE 2.35 (H) 01/24/2019   BUN 29 (H) 01/24/2019   NA 133 (L) 01/24/2019   K 3.8 01/24/2019   CL 102 01/24/2019   CO2 18 (L) 01/24/2019    Lab Results  Component Value Date   ALT 11 01/24/2019   AST 21 01/24/2019   ALKPHOS 90 01/24/2019     Microbiology: Recent Results (from the past 240 hour(s))  Blood Culture (routine x 2)     Status: None (Preliminary result)   Collection Time: 01/23/19  6:27 PM  Result Value Ref Range Status   Specimen Description   Final    RIGHT ANTECUBITAL Performed at [facility], [place] [address] [place], [place] [address]    Special Requests   Final    BOTTLES DRAWN AEROBIC AND ANAEROBIC Blood Culture adequate volume Performed at [facility] [address] [place], [place] [address]    Culture  Setup Time   Final    GRAM POSITIVE COCCI IN PAIRS IN BOTH AEROBIC AND ANAEROBIC  BOTTLES Organism ID to follow CRITICAL RESULT CALLED TO, READ BACK BY AND VERIFIED WITH: Karel Jarvis PharmD 11:05 01/24/19 ([email or web address]) Performed at [facility], [place] [address] [place], [place] [address]    Culture GRAM POSITIVE COCCI  Final   Report Status PENDING  Incomplete  Blood Culture ID Panel (Reflexed)     Status: Abnormal   Collection Time: 01/23/19  6:27 PM  Result Value Ref Range Status   Enterococcus species DETECTED (A) NOT DETECTED Final    Comment: CRITICAL RESULT CALLED TO, READ BACK BY AND VERIFIED WITH: Karel Jarvis PharmD 11:05 01/24/19 ([email or web address])  Vancomycin resistance NOT DETECTED NOT DETECTED Final   Listeria monocytogenes NOT DETECTED NOT DETECTED Final   Staphylococcus species NOT DETECTED NOT DETECTED Final   Staphylococcus aureus (BCID) NOT DETECTED NOT DETECTED Final   Streptococcus species NOT DETECTED NOT DETECTED Final   Streptococcus agalactiae NOT DETECTED NOT DETECTED Final   Streptococcus pneumoniae NOT DETECTED NOT DETECTED Final   Streptococcus pyogenes NOT DETECTED NOT DETECTED Final   Acinetobacter baumannii NOT DETECTED NOT DETECTED Final   Enterobacteriaceae species NOT DETECTED NOT DETECTED Final   Enterobacter cloacae complex NOT DETECTED NOT DETECTED Final   Escherichia coli NOT DETECTED NOT DETECTED Final   Klebsiella oxytoca NOT DETECTED NOT DETECTED Final   Klebsiella pneumoniae NOT DETECTED NOT DETECTED Final   Proteus species NOT DETECTED NOT DETECTED Final   Serratia marcescens NOT DETECTED NOT DETECTED Final   Haemophilus influenzae NOT DETECTED NOT DETECTED Final   Neisseria meningitidis NOT DETECTED NOT DETECTED Final   Pseudomonas aeruginosa NOT DETECTED NOT DETECTED Final   Candida albicans NOT DETECTED NOT DETECTED Final   Candida glabrata NOT DETECTED NOT DETECTED Final   Candida krusei NOT DETECTED NOT DETECTED Final   Candida parapsilosis NOT DETECTED NOT DETECTED Final   Candida tropicalis NOT DETECTED NOT DETECTED Final     Comment: Performed at [facility], [address] [place], [place] [address]  Blood Culture (routine x 2)     Status: None (Preliminary result)   Collection Time: 01/23/19  6:32 PM  Result Value Ref Range Status   Specimen Description   Final    PORTA CATH Performed at [facility] [address], [place], [place] [address]    Special Requests   Final    BOTTLES DRAWN AEROBIC AND ANAEROBIC Blood Culture adequate volume Performed at [facility] [address], [place], [place] [address]    Culture  Setup Time   Final    GRAM POSITIVE COCCI IN BOTH AEROBIC AND ANAEROBIC BOTTLES CRITICAL VALUE NOTED.  VALUE IS CONSISTENT WITH PREVIOUSLY REPORTED AND CALLED VALUE.    Culture   Final    NO GROWTH < 12 HOURS Performed at [facility], [place] [address] [place], [place] [address]    Report Status PENDING  Incomplete  SARS Coronavirus 2 [facility] order, Performed in [facility] hospital lab)     Status: None   Collection Time: 01/23/19 10:51 PM  Result Value Ref Range Status   SARS Coronavirus 2 NEGATIVE NEGATIVE Final    Comment: (NOTE) If result is NEGATIVE SARS-CoV-2 target nucleic acids are NOT DETECTED. The SARS-CoV-2 RNA is generally detectable in upper and lower  respiratory specimens during the acute phase of infection. The lowest  concentration of SARS-CoV-2 viral copies this assay can detect is 250  copies / mL. A negative result does not preclude SARS-CoV-2 infection  and should not be used as the sole basis for treatment or other  patient management decisions.  A negative result may occur with  improper specimen collection / handling, submission of specimen other  than nasopharyngeal swab, presence of viral mutation(s) within the  areas targeted by this assay, and inadequate number of viral copies  (<250 copies / mL). A negative result must be combined with clinical  observations, patient history, and epidemiological  information. If result is POSITIVE SARS-CoV-2 target nucleic acids are DETECTED. The SARS-CoV-2 RNA is generally detectable in upper and lower  respiratory specimens dur ing the acute phase of infection.  Positive  results are indicative of active infection with SARS-CoV-2.  Clinical  correlation with patient history and other diagnostic information is  necessary to determine patient infection status.  Positive results do  not rule out bacterial infection or co-infection with other viruses. If result is PRESUMPTIVE POSTIVE SARS-CoV-2 nucleic acids MAY BE PRESENT.   A presumptive positive result was obtained on the submitted specimen  and confirmed on repeat testing.  While 2019 novel coronavirus  (SARS-CoV-2) nucleic acids may be present in the submitted sample  additional confirmatory testing may be necessary for epidemiological  and / or clinical management purposes  to differentiate between  SARS-CoV-2 and other Sarbecovirus currently known to infect humans.  If clinically indicated additional testing with an alternate test  methodology [phone]) is advised. The SARS-CoV-2 RNA is generally  detectable in upper and lower respiratory sp ecimens during the acute  phase of infection. The expected result is Negative. Fact Sheet for Patients:  StrictlyIdeas.no Fact Sheet for Healthcare Providers: BankingDealers.co.za This test is not yet approved or cleared by the [place] FDA and has been authorized for detection and/or diagnosis of SARS-CoV-2 by FDA under an Emergency Use Authorization (EUA).  This EUA will remain in effect (meaning this test can be used) for the duration of the COVID-19 declaration under Section 564(b)(1) of the Act, 21 U.S.C. section 360bbb-3(b)(1), unless the authorization is terminated or revoked sooner. Performed at [facility], [place] [address] [place], [place] [address]   Respiratory  Panel by PCR     Status: None   Collection Time: 01/24/19 12:13 AM  Result Value Ref Range Status   Adenovirus NOT DETECTED NOT DETECTED Final   Coronavirus 229E NOT DETECTED NOT DETECTED Final    Comment: (NOTE) The Coronavirus on the Respiratory Panel, DOES NOT test for the novel  Coronavirus (2019 nCoV)    Coronavirus HKU1 NOT DETECTED NOT DETECTED Final   Coronavirus NL63 NOT DETECTED NOT DETECTED Final   Coronavirus OC43 NOT DETECTED NOT DETECTED Final   Metapneumovirus NOT DETECTED NOT DETECTED Final   Rhinovirus / Enterovirus NOT DETECTED NOT DETECTED Final   Influenza A NOT DETECTED NOT DETECTED Final   Influenza B NOT DETECTED NOT DETECTED Final   Parainfluenza Virus 1 NOT DETECTED NOT DETECTED Final   Parainfluenza Virus 2 NOT DETECTED NOT DETECTED Final   Parainfluenza Virus 3 NOT DETECTED NOT DETECTED Final   Parainfluenza Virus 4 NOT DETECTED NOT DETECTED Final   Respiratory Syncytial Virus NOT DETECTED NOT DETECTED Final   Bordetella pertussis NOT DETECTED NOT DETECTED Final   Chlamydophila pneumoniae NOT DETECTED NOT DETECTED Final   Mycoplasma pneumoniae NOT DETECTED NOT DETECTED Final    Comment: Performed at [facility], [place]. [address], [place], [place] [address]     W , [name] [email or web address] 01/24/2019, 12:30 PM

## 2019-01-24 NOTE — Progress Notes (Signed)
I have spoken with pt's PCP, nursing staff, reviewed patient's chart. He has improving CXR, on RA despite markers for sepsis. COVID precautions are being d/c.

## 2019-01-24 NOTE — ED Notes (Signed)
Report attempted was told that charge nurse would call back in a few minutes after rearranging staff.

## 2019-01-25 LAB — CBC WITH DIFFERENTIAL/PLATELET
Abs Immature Granulocytes: 0.25 10*3/uL — ABNORMAL HIGH (ref 0.00–0.07)
Basophils Absolute: 0.1 10*3/uL (ref 0.0–0.1)
Basophils Relative: 1 %
Eosinophils Absolute: 0.3 10*3/uL (ref 0.0–0.5)
Eosinophils Relative: 5 %
HCT: 26.6 % — ABNORMAL LOW (ref 39.0–52.0)
Hemoglobin: 8.5 g/dL — ABNORMAL LOW (ref 13.0–17.0)
Immature Granulocytes: 5 %
Lymphocytes Relative: 19 %
Lymphs Abs: 1 10*3/uL (ref 0.7–4.0)
MCH: 27.9 pg (ref 26.0–34.0)
MCHC: 32 g/dL (ref 30.0–36.0)
MCV: 87.2 fL (ref 80.0–100.0)
Monocytes Absolute: 1 10*3/uL (ref 0.1–1.0)
Monocytes Relative: 19 %
Neutro Abs: 2.8 10*3/uL (ref 1.7–7.7)
Neutrophils Relative %: 51 %
Platelets: 159 10*3/uL (ref 150–400)
RBC: 3.05 MIL/uL — ABNORMAL LOW (ref 4.22–5.81)
RDW: 16.8 % — ABNORMAL HIGH (ref 11.5–15.5)
WBC: 5.4 10*3/uL (ref 4.0–10.5)
nRBC: 0 % (ref 0.0–0.2)

## 2019-01-25 LAB — BASIC METABOLIC PANEL
Anion gap: 9 (ref 5–15)
BUN: 32 mg/dL — ABNORMAL HIGH (ref 8–23)
CO2: 20 mmol/L — ABNORMAL LOW (ref 22–32)
Calcium: 9.2 mg/dL (ref 8.9–10.3)
Chloride: 108 mmol/L (ref 98–111)
Creatinine, Ser: 1.89 mg/dL — ABNORMAL HIGH (ref 0.61–1.24)
GFR calc Af Amer: 41 mL/min — ABNORMAL LOW (ref >60)
GFR calc non Af Amer: 36 mL/min — ABNORMAL LOW (ref >60)
Glucose, Bld: 140 mg/dL — ABNORMAL HIGH (ref 70–99)
Potassium: 4.2 mmol/L (ref 3.5–5.1)
Sodium: 137 mmol/L (ref 135–145)

## 2019-01-25 LAB — GLUCOSE, CAPILLARY
Glucose-Capillary: 141 mg/dL — ABNORMAL HIGH (ref 70–99)
Glucose-Capillary: 121 mg/dL — ABNORMAL HIGH (ref 70–99)
Glucose-Capillary: 155 mg/dL — ABNORMAL HIGH (ref 70–99)
Glucose-Capillary: 131 mg/dL — ABNORMAL HIGH (ref 70–99)
Glucose-Capillary: 154 mg/dL — ABNORMAL HIGH (ref 70–99)

## 2019-01-25 LAB — URINE CULTURE: Culture: >=100000 — AB

## 2019-01-25 LAB — MAGNESIUM: Magnesium: 1.7 mg/dL (ref 1.7–2.4)

## 2019-01-25 LAB — PHOSPHORUS: Phosphorus: 4.1 mg/dL (ref 2.5–4.6)

## 2019-01-25 MED ORDER — SENNOSIDES-DOCUSATE SODIUM 8.6-50 MG PO TABS
1.0000 | ORAL_TABLET | Freq: Every day | ORAL | Status: DC
  Administered 2019-01-25 – 2019-01-27 (×3): 1 via ORAL
  Filled 2019-01-25 (×3): qty 1

## 2019-01-25 MED ORDER — ATORVASTATIN CALCIUM 10 MG PO TABS
20.0000 mg | ORAL_TABLET | Freq: Every day | ORAL | Status: DC
  Administered 2019-01-25 – 2019-01-26 (×2): 20 mg via ORAL
  Filled 2019-01-25 (×2): qty 2

## 2019-01-25 MED ORDER — SODIUM CHLORIDE 0.9% FLUSH
10.0000 mL | INTRAVENOUS | Status: DC | PRN
  Administered 2019-01-26: 10 mL
  Filled 2019-01-25: qty 40

## 2019-01-25 MED ORDER — SODIUM CHLORIDE 0.9% FLUSH
10.0000 mL | Freq: Two times a day (BID) | INTRAVENOUS | Status: DC
  Administered 2019-01-25: 10 mL

## 2019-01-25 MED ORDER — MAGNESIUM SULFATE IN D5W 1-5 GM/100ML-% IV SOLN
1.0000 g | Freq: Once | INTRAVENOUS | Status: AC
  Administered 2019-01-25: 1 g via INTRAVENOUS
  Filled 2019-01-25: qty 100

## 2019-01-25 NOTE — Progress Notes (Signed)
PROGRESS NOTE    Eric Price  WNI:[id number] DOB: 12/28/1950 DOA: 01/23/2019 PCP: [name], MD   Brief Narrative:  HPI Per Dr. Toy Baker on 01/23/2019 Eric Price is a 68 y.o. 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  He may have had some dysuria a few days ago but the fever started only today. It was a sudden onset of illness. Wife states it was like someone took a plug out. He has been incontinent only started today, he reports it has been burning a bit with urination. Family reports mild dry cough, no chest pain. No abdominal pain, no diarrhea, reports leg aches, rigors    Patient Has hospice at home who has been seen him in the regular basis. Prior to today he was eating and drinking well but today he took nosedive and declined. No shortness of breath no nausea vomiting no diarrhea He lost his sense of smell since chemo  Has recently been admitted at [facility] for hypercalcemia and confusion Have had significant weakness generalized at that time. CT brain within normal limits calcium was found to be 14.2 decreased PTH creatinine was elevated 1.6 time chest x-ray showed mild left more than right heterogeneous opacities thought to be may be edema he was given zoledronic acid and calcitonin Zolendronic acid 4 mg IV x1 on 4/8 and Calcitonin subQ q12 for 48 hours (4/8, 4/9) & (4/13, 4/14) which decreased his Ca  from 14.2 till 9.6.  During admission pt received multiple doses of lasix (IV 80 mg) throughout admission for volume overload.   Given that his mycosis fungoides have been progressing oncology felt that the had no more options for him to offer his case was discussed with patient and his family and they agreed to hospice care patient was discharged home with home hospice.   They have come to see him a few times.  **Interim History Sepsis physiology has improved and he states he is feeling better.  Antibiotics were changed to IV ampicillin.  Palliative care for further goals of discussion care.  Assessment & Plan:   Active Problems:   HTN (hypertension)   HLD (hyperlipidemia)   Mycosis fungoides (HCC)   Controlled type 2 diabetes mellitus with stage 3 chronic kidney disease, with long-term current use of insulin (HCC)   Nonischemic cardiomyopathy (HCC)   Coronary artery disease involving native coronary artery of native heart without angina pectoris   S/P BKA (below knee amputation), left (HCC)   ICD (implantable cardioverter-defibrillator) in place   Chronic combined systolic and diastolic CHF (congestive heart failure) (HCC)   CKD (chronic kidney disease) stage 3, GFR 30-59 ml/min (HCC)   SIRS (systemic inflammatory response syndrome) (HCC)   Scrotal swelling   Type 2 diabetes mellitus with diabetic polyneuropathy (HCC)   Type 2 diabetes mellitus with hyperglycemia ([place])   CAP (community acquired pneumonia)   Sepsis ([place])   Suspected Covid-19 Virus  Infection   HCAP (healthcare-associated pneumonia)  Sepsis secondary to Enterococcus Faecalis Bacteremia -On admission, patient was tachycardic with tachypnea and fever -Blood cultures GPC, Enterococcus species and grew out Enterococcus Faecalis with susceptibilities to follow  -Chest x-ray showed "Interval improvement in bilateral airspace disease. Mild residual left lower lobe airspace disease. Most likely clearing pneumonia."  -COVID-19 negative -Respiratory viral panel Negative  -Urine culture grew out Enterococcus Faecalis (>100,000 CFU) and 80,000 CFU of Klebsiella Pneumoniae, although UA unremarkable for infection and patient not complaining of Urinary Symptoms so likely a pyuria/asymptomatic bacteriuria  -Continue IV antibiotics but was transitioned to IV Ampicillin.  Of note patient did have enterococcus back in June 2019 -Infectious disease made aware of patient's admission.  Do not feel that patient warrants work-up for endocarditis at this time but that depends on how aggressive we will need to be pending Palliative Discussion as patient was recommended for Hospice. If he requires aggressive care he would need a TEE and AICD removal due to his persistent bacteremia along with prolonged IV antibiotics with dual therapy per ID recommendations -Per ID if he continues on hospice we can stop antibiotics altogether versus continuing oral amoxicillin for suppression which would not cure or effectively treat his infection and may cause relapse of some symptoms -Obtained repeat Blood Cx and they are pending   Mycosis Fungoides -Patient was followed by Oncology at [facility] however is no longer candidate for chemotherapy or further management.  His last admission he was sent home with Hospice -Patient currently DNR DNI -Palliative Care Medicine consulted and appreciate further evaluation and recommendations -Patient with Abdominal skin breakdown, wound care consulted  Coronary Artery Disease -Stable and currently chest pain-free -Currently his Carevdilol 37.5 mg pO BID\ -Resumed Atorvastatin 20 mg po Daily are being held   Chronic Combined Systolic and Diastolic heart failure -Patient does have right lower extremity edema although does not appear to be volume overloaded; IVF with NS at 125 mL/hr now been D/C'd -Lasix was initially held given sepsis and will continue to Hold today  -Strict I's/O's, Daily Weights  -Patient is +664.7 mL since Admission and patient is +8 lbs since Admission -Continue to Monitor for S/Sx of Volume Overload   Acute Kidney Injury on Chronic Kidney Disease, stage III -Likely secondary to sepsis but is now improving -Patient's BUN/Cr went from 30/2.49 -> 29/2.35 -> 32/1.89 -Was on IVF with NS at 125 mL/hr and now stopped -Continue to Monitor and Trend Renal Fxn -Repeat CMP in AM   Diabetes Mellitus, Type II -CBG's ranging from 121-176 -HbA1c was 6.4 -Continue with Insulin Detemir 5 units sq qHS and Sensitive Novolog SSI q4h -Continue to Monitor and adjust Insulin as Necessary   Hypomagnesemia -Was 1.2 pm Admission and is now 1.7 -Replete with IV Mag Sulfate 1 mg  -Continue to Monitor and Replete as Necessary -Repeat Mag Level in AM   Chronic Normocytic Anemia/Anemia of Chronic Kidney Disease -Patient's Hb/Hct went from 10.8/33.2 -> 9.7/29.6 -> 9.4/29.1 -> 8.5/26.6  -In the setting of IVF Resuscitation  -Check Anemia Panel in the AM -Continue to Monitor for S/Sx of Bleeding -Repeat CBC  Hyperlipidemia -Continue Atorvastatin 20 mg po Daily   Essential Hypertension -Carvedilol was held -Continue to monitor closely and restart in AM if Blood Pressure is better   Goals of Care -As above, patient was discharged from [facility] with Hospice. -Currently DNR/DNI -Palliative care consulted and appreciate further evaluation and recommendations   Obesity -Estimated body mass index is 32.45  kg/m as calculated from the following:   Height as of this encounter: 5\' 8"  (1.727 m).   Weight as of this encounter: 96.8 kg. -Weight Loss and Dietary Counseling given   DVT prophylaxis: Anticoagulated with Rivaroxaban 20 mg po Daily  Code Status: DO NOT RESUSCITATE  Family Communication: No family present at bedside  Disposition Plan: Home Health PT  Consultants:   Palliative Care Medicine  Infectious Diseases   Procedures: None    Antimicrobials:  Anti-infectives (From admission, onward)   Start     Dose/Rate Route Frequency Ordered Stop   01/25/19 1000  vancomycin (VANCOCIN) IVPB 1000 mg/200 mL premix  Status:  Discontinued     1,000 mg 200 mL/hr over 60 Minutes Intravenous Every 36 hours 01/24/19 0622 01/24/19 1119   01/24/19 2200  ampicillin (OMNIPEN) 2 g in sodium chloride 0.9 % 100 mL IVPB     2 g 300 mL/hr over 20 Minutes Intravenous Every 6 hours 01/24/19 1120     01/24/19 1000  ceFEPIme (MAXIPIME) 2 g in sodium chloride 0.9 % 100 mL IVPB  Status:  Discontinued     2 g 200 mL/hr over 30 Minutes Intravenous Every 12 hours 01/24/19 0706 01/24/19 1119   01/24/19 0030  metroNIDAZOLE (FLAGYL) IVPB 500 mg  Status:  Discontinued     500 mg 100 mL/hr over 60 Minutes Intravenous Every 8 hours 01/24/19 0013 01/24/19 1119   01/23/19 2115  vancomycin (VANCOCIN) 2,000 mg in sodium chloride 0.9 % 500 mL IVPB     2,000 mg 250 mL/hr over 120 Minutes Intravenous  Once 01/23/19 2108 01/24/19 0005   01/23/19 1930  ceFEPIme (MAXIPIME) 2 g in sodium chloride 0.9 % 100 mL IVPB     2 g 200 mL/hr over 30 Minutes Intravenous STAT 01/23/19 1926 01/23/19 2030     Subjective: Seen and examined at bedside and states he is feeling better.  Denies chest pain, lightheadedness or dizziness.  No cough or shortness of breath.  States that he had "cancer of the skin from head to toe".  Denies any fevers and states that he supple.  No other concerns or plans at this time.  Objective: Vitals:   01/24/19 2346 01/25/19 0333 01/25/19 1022 01/25/19 1153  BP: 113/65 118/74 114/68 107/68  Pulse: 89 88 93 84  Resp: (!) 22 16    Temp: 98.3 F (36.8 C) 97.7 F (36.5 C) (!) 97.4 F (36.3 C) 97.9 F (36.6 C)  TempSrc: Oral Oral Oral   SpO2: 98% 96%  100%  Weight:      Height:        Intake/Output Summary (Last 24 hours) at 01/25/2019 1214 Last data filed at 01/25/2019 1000 Gross per 24 hour  Intake 720 ml  Output 1750 ml  Net -1030 ml    Filed Weights   01/23/19 1811 01/24/19 0100  Weight: 93 kg 96.8 kg   Examination: Physical Exam:  Constitutional: WN/WD obese AAM in NAD and appears calm and comfortable Eyes: Lids and conjunctivae normal, sclerae anicteric  ENMT: External Ears, Nose appear normal. Grossly normal hearing. Mucous membranes are moist. .  Neck: Appears normal, supple, no cervical masses, normal ROM, no appreciable thyromegaly; no JVD Respiratory: Diminished to auscultation bilaterally, no wheezing, rales, rhonchi or crackles. Normal respiratory effort and patient is not tachypenic. No accessory muscle use.  Cardiovascular: RRR, no murmurs / rubs / gallops. S1 and S2 auscultated. 1+ LE extremity edema in Right Leg Abdomen: Soft, non-tender, Distended 2/2 to  body habitus. No masses palpated. No appreciable hepatosplenomegaly. Bowel sounds positive.  GU: Deferred. Musculoskeletal: Left BKA Skin: No rashes, lesions, ulcers on a limited skin evaluation. No induration; Warm and dry.  Neurologic: CN 2-12 grossly intact with no focal deficits.  Romberg sign and cerebellar reflexes not assessed.  Psychiatric: Normal judgment and insight. Alert and oriented x 3. Normal mood and appropriate affect.   Data Reviewed: I have personally reviewed following labs and imaging studies  CBC: Recent Labs  Lab 01/23/19 1827 01/24/19 0138 01/24/19 0321 01/25/19 0616  WBC 8.0 9.3 9.9 5.4  NEUTROABS 4.1 4.5 4.7 2.8  HGB 10.8* 9.7* 9.4* 8.5*  HCT 33.2* 29.6* 29.1* 26.6*  MCV 84.5 84.1 84.3 87.2  PLT 257 186 217 409   Basic Metabolic Panel: Recent Labs  Lab 01/23/19 1827 01/24/19 0013 01/24/19 0150 01/24/19 0321 01/25/19 0616  NA 133*  --   --  133* 137  K 4.3  --   --  3.8 4.2  CL 100  --   --  102 108  CO2 21*  --   --  18* 20*  GLUCOSE 117*  --   --  116* 140*  BUN 30*  --   --  29* 32*  CREATININE 2.49*  --   --  2.35* 1.89*  CALCIUM 10.4*  --   --  9.3 9.2  MG  --  1.2*  --  1.4* 1.7  PHOS  --   --  2.8  3.2 4.1   GFR: Estimated Creatinine Clearance: 42.2 mL/min (A) (by C-G formula based on SCr of 1.89 mg/dL (H)). Liver Function Tests: Recent Labs  Lab 01/23/19 1827 01/24/19 0321  AST 23 21  ALT 12 11  ALKPHOS 104 90  BILITOT 0.2* 0.6  PROT 6.3* 5.8*  ALBUMIN 3.1* 2.9*   No results for input(s): LIPASE, AMYLASE in the last 168 hours. No results for input(s): AMMONIA in the last 168 hours. Coagulation Profile: No results for input(s): INR, PROTIME in the last 168 hours. Cardiac Enzymes: Recent Labs  Lab 01/23/19 2140  TROPONINI <0.03   BNP (last 3 results) Recent Labs    06/15/18 1644  PROBNP 17.0   HbA1C: Recent Labs    01/24/19 0138  HGBA1C 6.4*   CBG: Recent Labs  Lab 01/24/19 2013 01/24/19 2343 01/25/19 0333 01/25/19 0739 01/25/19 1139  GLUCAP 176* 140* 141* 121* 155*   Lipid Profile: No results for input(s): CHOL, HDL, LDLCALC, TRIG, CHOLHDL, LDLDIRECT in the last 72 hours. Thyroid Function Tests: Recent Labs    01/24/19 0321  TSH 1.050   Anemia Panel: No results for input(s): VITAMINB12, FOLATE, FERRITIN, TIBC, IRON, RETICCTPCT in the last 72 hours. Sepsis Labs: Recent Labs  Lab 01/23/19 1827 01/23/19 2251 01/24/19 0138 01/24/19 0321  LATICACIDVEN 3.6* 2.7* 2.0* 2.2*    Recent Results (from the past 240 hour(s))  Blood Culture (routine x 2)     Status: Abnormal (Preliminary result)   Collection Time: 01/23/19  6:27 PM  Result Value Ref Range Status   Specimen Description   Final    RIGHT ANTECUBITAL Performed at [facility], [place] [address], [place], [place] [address]    Special Requests   Final    BOTTLES DRAWN AEROBIC AND ANAEROBIC Blood Culture adequate volume Performed at [facility] [place] [address], [place], [place] [address]    Culture  Setup Time   Final    GRAM POSITIVE COCCI IN PAIRS IN BOTH  AEROBIC AND ANAEROBIC BOTTLES CRITICAL RESULT CALLED TO, READ BACK BY AND VERIFIED WITH: Karel Jarvis PharmD 11:05 01/24/19 ([email or web address])    Culture (A)  Final    ENTEROCOCCUS FAECALIS SUSCEPTIBILITIES TO FOLLOW Performed at [facility], [place] [address] [place], [place] [address]    Report Status PENDING  Incomplete  Blood Culture ID Panel (Reflexed)     Status: Abnormal   Collection Time: 01/23/19  6:27 PM  Result Value Ref Range Status   Enterococcus species DETECTED (A) NOT DETECTED Final    Comment: CRITICAL RESULT CALLED TO, READ BACK BY AND VERIFIED WITH: Karel Jarvis PharmD 11:05 01/24/19 ([email or web address])    Vancomycin resistance NOT DETECTED NOT DETECTED Final   Listeria monocytogenes NOT DETECTED NOT DETECTED Final   Staphylococcus species NOT DETECTED NOT DETECTED Final   Staphylococcus aureus (BCID) NOT DETECTED NOT DETECTED Final   Streptococcus species NOT DETECTED NOT DETECTED Final   Streptococcus agalactiae NOT DETECTED NOT DETECTED Final   Streptococcus pneumoniae NOT DETECTED NOT DETECTED Final   Streptococcus pyogenes NOT DETECTED NOT DETECTED Final   Acinetobacter baumannii NOT DETECTED NOT DETECTED Final   Enterobacteriaceae species NOT DETECTED NOT DETECTED Final   Enterobacter cloacae complex NOT DETECTED NOT DETECTED Final   Escherichia coli NOT DETECTED NOT DETECTED Final   Klebsiella oxytoca NOT DETECTED NOT DETECTED Final   Klebsiella pneumoniae NOT DETECTED NOT DETECTED Final   Proteus species NOT DETECTED NOT DETECTED Final   Serratia marcescens NOT DETECTED NOT DETECTED Final   Haemophilus influenzae NOT DETECTED NOT DETECTED Final   Neisseria meningitidis NOT DETECTED NOT DETECTED Final   Pseudomonas aeruginosa NOT DETECTED NOT DETECTED Final   Candida albicans NOT DETECTED NOT DETECTED Final   Candida glabrata NOT DETECTED NOT DETECTED Final   Candida krusei NOT DETECTED NOT DETECTED Final   Candida parapsilosis NOT DETECTED NOT DETECTED Final   Candida tropicalis NOT DETECTED NOT DETECTED Final    Comment: Performed at [facility], [address], [place], [place] [address]  Blood Culture (routine x 2)     Status: Abnormal (Preliminary result)   Collection Time: 01/23/19  6:32 PM  Result Value Ref Range Status   Specimen Description   Final    PORTA CATH Performed at [place] [address] [place], [place] [address]    Special Requests   Final    BOTTLES DRAWN AEROBIC AND ANAEROBIC Blood Culture adequate volume Performed at [facility] [address] [place], [place] [address]    Culture  Setup Time   Final    GRAM POSITIVE COCCI IN BOTH AEROBIC AND ANAEROBIC BOTTLES CRITICAL VALUE NOTED.  VALUE IS CONSISTENT WITH PREVIOUSLY REPORTED AND CALLED VALUE. Performed at [facility], [place] [address], [place], [place] [address]    Culture ENTEROCOCCUS FAECALIS (A)  Final   Report Status PENDING  Incomplete  Urine culture     Status: Abnormal   Collection Time: 01/23/19  8:06 PM  Result Value Ref Range Status   Specimen Description   Final    URINE, CATHETERIZED Performed at [place] [address] [place], [place] [address]    Special Requests   Final    Immunocompromised Performed at [facility], [place] [address] [place], [place] [address]    Culture (A)  Final    >=100,000 COLONIES/mL ENTEROCOCCUS FAECALIS 80,000 COLONIES/mL KLEBSIELLA PNEUMONIAE    Report Status 01/25/2019 FINAL  Final  Organism ID, Bacteria KLEBSIELLA PNEUMONIAE (A)  Final   Organism ID, Bacteria ENTEROCOCCUS FAECALIS (A)  Final      Susceptibility   Enterococcus faecalis - MIC*    AMPICILLIN <=2 SENSITIVE Sensitive     LEVOFLOXACIN 1 SENSITIVE Sensitive     NITROFURANTOIN <=16 SENSITIVE Sensitive     VANCOMYCIN 1 SENSITIVE Sensitive     * >=100,000 COLONIES/mL ENTEROCOCCUS FAECALIS   Klebsiella pneumoniae - MIC*    AMPICILLIN >=32 RESISTANT Resistant     CEFAZOLIN 8 SENSITIVE Sensitive     CEFTRIAXONE <=1 SENSITIVE Sensitive     CIPROFLOXACIN  <=0.25 SENSITIVE Sensitive     GENTAMICIN <=1 SENSITIVE Sensitive     IMIPENEM <=0.25 SENSITIVE Sensitive     NITROFURANTOIN 32 SENSITIVE Sensitive     TRIMETH/SULFA <=20 SENSITIVE Sensitive     AMPICILLIN/SULBACTAM 16 INTERMEDIATE Intermediate     PIP/TAZO <=4 SENSITIVE Sensitive     Extended ESBL NEGATIVE Sensitive     * 80,000 COLONIES/mL KLEBSIELLA PNEUMONIAE  SARS Coronavirus 2 [facility] order, Performed in [place] hospital lab)     Status: None   Collection Time: 01/23/19 10:51 PM  Result Value Ref Range Status   SARS Coronavirus 2 NEGATIVE NEGATIVE Final    Comment: (NOTE) If result is NEGATIVE SARS-CoV-2 target nucleic acids are NOT DETECTED. The SARS-CoV-2 RNA is generally detectable in upper and lower  respiratory specimens during the acute phase of infection. The lowest  concentration of SARS-CoV-2 viral copies this assay can detect is 250  copies / mL. A negative result does not preclude SARS-CoV-2 infection  and should not be used as the sole basis for treatment or other  patient management decisions.  A negative result may occur with  improper specimen collection / handling, submission of specimen other  than nasopharyngeal swab, presence of viral mutation(s) within the  areas targeted by this assay, and inadequate number of viral copies  (<250 copies / mL). A negative result must be combined with clinical  observations, patient history, and epidemiological information. If result is POSITIVE SARS-CoV-2 target nucleic acids are DETECTED. The SARS-CoV-2 RNA is generally detectable in upper and lower  respiratory specimens dur ing the acute phase of infection.  Positive  results are indicative of active infection with SARS-CoV-2.  Clinical  correlation with patient history and other diagnostic information is  necessary to determine patient infection status.  Positive results do  not rule out bacterial infection or co-infection with other viruses. If result is  PRESUMPTIVE POSTIVE SARS-CoV-2 nucleic acids MAY BE PRESENT.   A presumptive positive result was obtained on the submitted specimen  and confirmed on repeat testing.  While 2019 novel coronavirus  (SARS-CoV-2) nucleic acids may be present in the submitted sample  additional confirmatory testing may be necessary for epidemiological  and / or clinical management purposes  to differentiate between  SARS-CoV-2 and other Sarbecovirus currently known to infect humans.  If clinically indicated additional testing with an alternate test  methodology [phone]) is advised. The SARS-CoV-2 RNA is generally  detectable in upper and lower respiratory sp ecimens during the acute  phase of infection. The expected result is Negative. Fact Sheet for Patients:  StrictlyIdeas.no Fact Sheet for Healthcare Providers: BankingDealers.co.za This test is not yet approved or cleared by the [place] FDA and has been authorized for detection and/or diagnosis of SARS-CoV-2 by FDA under an Emergency Use Authorization (EUA).  This EUA will remain in effect (meaning this test can be used) for the  duration of the COVID-19 declaration under Section 564(b)(1) of the Act, 21 U.S.C. section 360bbb-3(b)(1), unless the authorization is terminated or revoked sooner. Performed at [facility], [place] [address], [place], [place] [address]   Respiratory Panel by PCR     Status: None   Collection Time: 01/24/19 12:13 AM  Result Value Ref Range Status   Adenovirus NOT DETECTED NOT DETECTED Final   Coronavirus 229E NOT DETECTED NOT DETECTED Final    Comment: (NOTE) The Coronavirus on the Respiratory Panel, DOES NOT test for the novel  Coronavirus (2019 nCoV)    Coronavirus HKU1 NOT DETECTED NOT DETECTED Final   Coronavirus NL63 NOT DETECTED NOT DETECTED Final   Coronavirus OC43 NOT DETECTED NOT DETECTED Final   Metapneumovirus NOT DETECTED NOT DETECTED Final    Rhinovirus / Enterovirus NOT DETECTED NOT DETECTED Final   Influenza A NOT DETECTED NOT DETECTED Final   Influenza B NOT DETECTED NOT DETECTED Final   Parainfluenza Virus 1 NOT DETECTED NOT DETECTED Final   Parainfluenza Virus 2 NOT DETECTED NOT DETECTED Final   Parainfluenza Virus 3 NOT DETECTED NOT DETECTED Final   Parainfluenza Virus 4 NOT DETECTED NOT DETECTED Final   Respiratory Syncytial Virus NOT DETECTED NOT DETECTED Final   Bordetella pertussis NOT DETECTED NOT DETECTED Final   Chlamydophila pneumoniae NOT DETECTED NOT DETECTED Final   Mycoplasma pneumoniae NOT DETECTED NOT DETECTED Final    Comment: Performed at [facility], [place]. [address] [place], [place] [address]  MRSA PCR Screening     Status: None   Collection Time: 01/24/19  6:30 PM  Result Value Ref Range Status   MRSA by PCR NEGATIVE NEGATIVE Final    Comment:        The GeneXpert MRSA Assay (FDA approved for NASAL specimens only), is one component of a comprehensive MRSA colonization surveillance program. It is not intended to diagnose MRSA infection nor to guide or monitor treatment for MRSA infections. Performed at [facility], [place] [address] [place], [place] [address]    Radiology Studies: Dg Chest Port 1 View  Result Date: 01/23/2019 CLINICAL DATA:  Fever EXAM: PORTABLE CHEST 1 VIEW COMPARISON:  11/23/2018 FINDINGS: Significant improvement in infiltrate on the left with mild residual airspace disease on the left. Right lung is clear. Negative for edema or effusion AICD in the right ventricle unchanged. Heart size within normal limits. IMPRESSION: Interval improvement in bilateral airspace disease. Mild residual left lower lobe airspace disease. Most likely clearing pneumonia. Electronically Signed   By: Franchot Gallo M.D.   On: 01/23/2019 19:25   Scheduled Meds: . clobetasol cream  1 application Topical BID  . insulin aspart  0-9 Units Subcutaneous Q4H  . insulin detemir  5  Units Subcutaneous QHS  . pantoprazole  40 mg Oral Daily  . prednisoLONE acetate  1 drop Both Eyes Daily  . rivaroxaban  20 mg Oral Q supper  . sodium chloride flush  3 mL Intravenous Q12H   Continuous Infusions: . sodium chloride 125 mL/hr at 01/25/19 0334  . ampicillin (OMNIPEN) IV 2 g (01/25/19 1012)    LOS: 2 days   Kerney Elbe, DO [facility] PAGER is on [place]  If 7PM-7AM, please contact night-coverage [email or web address] Password [facility] 01/25/2019, 12:14 PM

## 2019-01-25 NOTE — Consult Note (Signed)
Consultation Note Date: 01/25/2019   Patient Name: Eric Price  DOB: June 13, 1951  MRN: [id number]  Age / Sex: 68 y.o., male  PCP: [name], MD Referring Physician: Kerney Elbe, DO  Reason for Consultation: Establishing goals of care  HPI/Patient Profile: 68 y.o. 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  Noted prior episode bacteremia in June 2019.  Palliative consulted for [place].   Clinical Assessment and Goals of Care: I met today with Eric Price.   He is awake and alert, but appears tired and does not have full insight into events surrounding hospitalization.  Reviewed course with him including recurrent bacteremia.   He asked me to call his wife.  I called and was able to reach his wife.  We reviewed his clinical course over the past several months.  She has seen a continued decline over the past several weeks, but reports acute worsening just prior to hospitalization.  We discussed difference between a aggressive medical intervention path and a palliative, comfort focused care path.  Values and goals of care important to patient and family were attempted to be elicited.  Concept of Hospice and Palliative Care were discussed  Questions and concerns addressed.   PMT will continue to support holistically.  SUMMARY OF RECOMMENDATIONS   - Continue current interventions.  He appears to be improved clinically from admission.  Began discussion with patient and his wife regarding long term plan with recurrent bacteremia and underlying cancer.  Appreciate ID input and I reviewed this with his wife.  Wife would like to reassess situation in 24 hours.    Code Status/Advance Care Planning:  DNR  Palliative Prophylaxis:   Frequent Pain Assessment  Psycho-social/Spiritual:   Desire for further Chaplaincy support:no  Additional Recommendations: Education on Hospice  Prognosis:   < 6 months  Discharge Planning: Home with Hospice when medically maximized     Primary Diagnoses: Present on Admission: . Sepsis ([place]) . Type 2 diabetes mellitus with diabetic polyneuropathy ([place]) . CAP (community acquired pneumonia) . HTN (hypertension) . HLD (hyperlipidemia) . Mycosis fungoides ([place]) . Coronary artery disease involving native coronary artery of native heart without angina pectoris . ICD (implantable cardioverter-defibrillator) in place . Chronic combined systolic and diastolic CHF (congestive heart failure) ([place]) . CKD (chronic kidney disease) stage 3, GFR 30-59 ml/min (HCC) . SIRS (systemic inflammatory response syndrome) (HCC) . Scrotal swelling . Type 2 diabetes mellitus with hyperglycemia ([place]) . Suspected Covid-19 Virus Infection . HCAP (healthcare-associated pneumonia)   I have reviewed the medical record, interviewed the patient and family, and examined the patient. The following aspects are pertinent.  Past Medical History:  Diagnosis Date  . Arthritis    knees, shoulder, hands   . Chronic combined systolic and diastolic CHF (congestive heart failure) ([place]) 07/17/2016  . Coronary artery disease   . Critical  lower limb ischemia   . Depression   . Hyperlipidemia   . Hypertension   . 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   . Nonischemic cardiomyopathy ([place])   . Peripheral vascular disease ([place])   . Pneumonia 2013   hosp. - [facility] x1 week   . Renal insufficiency 03/08/2018  . Shortness of breath dyspnea    related to pain currently  . SIRS (systemic inflammatory response syndrome) ([place]) 03/2018  . Sleep apnea    10-20 yrs. ago, states he used CPAP, not needed anymore.   . Type II diabetes mellitus ([place])     Social History   Socioeconomic History  . Marital status: Married    Spouse name: Not on file  . Number of children: 2  . Years of education: Not on file  . Highest education level: Not on file  Occupational History    Comment: upholster.   Social Needs  . Financial resource strain: Not on file  . Food insecurity:    Worry: Not on file    Inability: Not on file  . Transportation needs:    Medical: Not on file    Non-medical: Not on file  Tobacco Use  . Smoking status: Never Smoker  . Smokeless tobacco: Never Used  Substance and Sexual Activity  . Alcohol use: No    Alcohol/week: 0.0 standard drinks  . Drug use: No  . Sexual activity: Not Currently  Lifestyle  . Physical activity:    Days per week: Not on file    Minutes per session: Not on file  . Stress: Not on file  Relationships  . Social connections:    Talks on phone: Not on file    Gets together: Not on file    Attends religious service: Not on file    Active member of club or organization: Not on file    Attends meetings of clubs or organizations: Not on file    Relationship status: Not on file  Other Topics Concern  . Not on file  Social History Narrative   Lives with wife.    Family History  Problem Relation Age of Onset  . CAD Father 63       Died 49  . Hypertension Father   . Heart failure Mother 76  . Diabetes Maternal Grandmother   . CAD Paternal Grandfather 6  . Colon cancer Neg Hx   . Esophageal cancer Neg Hx   . Stomach cancer Neg Hx   . Rectal cancer Neg Hx    Scheduled Meds: . clobetasol cream  1 application Topical BID  . insulin aspart  0-9 Units Subcutaneous Q4H  . insulin detemir  5 Units Subcutaneous QHS  . pantoprazole  40 mg Oral Daily  . prednisoLONE acetate  1 drop Both Eyes Daily  . rivaroxaban  20 mg Oral Q supper  . sodium chloride flush  3 mL Intravenous Q12H   Continuous Infusions: . sodium chloride 125 mL/hr at 01/25/19 0334  . ampicillin (OMNIPEN) IV 2 g (01/25/19  0403)   PRN Meds:.acetaminophen **OR** acetaminophen, benzonatate, HYDROcodone-acetaminophen, ondansetron **OR** ondansetron (ZOFRAN) IV, traMADol Medications Prior to Admission:  Prior to Admission medications   Medication Sig Start Date End Date Taking? Authorizing Provider  acetaminophen (TYLENOL) 500 MG tablet Take 500-1,000 mg by mouth every 8 (eight) hours as needed for mild pain.    Yes [provider]  albuterol (PROVENTIL) (2.5 MG/3ML) 0.083% nebulizer solution Take  3 mLs (2.5 mg total) by nebulization 2 (two) times daily as needed for wheezing or shortness of breath. 11/27/18  Yes Hall, Carole N, DO  atorvastatin (LIPITOR) 20 MG tablet TAKE 1 TABLET BY MOUTH EVERY DAY AT 6 Patient taking differently: Take 20 mg by mouth daily.  04/13/18  Yes Burchette, Alinda Sierras, MD  benzonatate (TESSALON) 200 MG capsule TAKE 1 CAPSULE(200 MG) BY MOUTH THREE TIMES DAILY AS NEEDED FOR COUGH 01/02/19  Yes Burchette, Alinda Sierras, MD  carvedilol (COREG) 25 MG tablet Take 1.5 tablets (37.5 mg total) by mouth 2 (two) times daily with a meal. Patient taking differently: Take 25 mg by mouth 2 (two) times daily with a meal.  07/01/18  Yes Croitoru, Mihai, MD  clobetasol cream (TEMOVATE) 4.82 % Apply 1 application topically 2 (two) times daily.  10/13/18  Yes [provider]  Dulaglutide (TRULICITY) 1.5 NO/0.3BC SOPN Inject 1.5 mg into the skin every Tuesday. 11/08/18  Yes Philemon Kingdom, MD  furosemide (LASIX) 80 MG tablet Take no Lasix if weight is under 200; take 1 tablet daily if weight over 200 and bid if weight is over 205 12/20/18  Yes Kilroy, Luke K, PA-C  glucagon 1 MG injection Inject 1 mg into the vein once as needed (for high blood sugar). Patient taking differently: Inject 1 mg into the vein once as needed (for low blood sugar).  04/13/18  Yes Burchette, Alinda Sierras, MD  Insulin Detemir (LEVEMIR FLEXTOUCH) 100 UNIT/ML Pen Inject 10 Units into the skin at bedtime.  01/18/19  Yes [provider]   loratadine (CLARITIN) 10 MG tablet Take 10 mg by mouth daily.   Yes [provider]  LORazepam (ATIVAN) 0.5 MG tablet Take 1 tablet by mouth every 4 (four) hours as needed for anxiety. 01/19/19  Yes [provider]  Melatonin 3 MG TABS Take 1 tablet by mouth at bedtime.   Yes [provider]  Nutritional Supplements (ENSURE COMPLETE PO) Take 237 mLs by mouth daily.  03/14/18  Yes [provider]  nystatin (MYCOSTATIN) 100000 UNIT/ML suspension Take 5 mLs by mouth 3 (three) times daily.  06/12/18  Yes [provider]  ondansetron (ZOFRAN) 8 MG tablet Take 8 mg by mouth every 8 (eight) hours as needed for nausea or vomiting.  09/23/18  Yes [provider]  pantoprazole (PROTONIX) 40 MG tablet TAKE 1 TABLET(40 MG) BY MOUTH DAILY Patient taking differently: Take 40 mg by mouth daily.  11/23/18  Yes Croitoru, Mihai, MD  prednisoLONE acetate (PRED FORTE) 1 % ophthalmic suspension Place 1 drop into both eyes daily. 12/23/18  Yes [provider]  prochlorperazine (COMPAZINE) 10 MG tablet Take 10 mg by mouth 3 (three) times daily.  08/28/18  Yes [provider]  rivaroxaban (XARELTO) 20 MG TABS tablet Take 1 tablet (20 mg total) by mouth daily with supper. 08/30/18  Yes Burchette, Alinda Sierras, MD  sennosides-docusate sodium (SENOKOT-S) 8.6-50 MG tablet Take 1 tablet by mouth daily.   Yes [provider]  tiZANidine (ZANAFLEX) 2 MG tablet Take 1 tablet by mouth at bedtime as needed for muscle spasms. 01/23/19  Yes [provider]  traMADol (ULTRAM) 50 MG tablet Take 1 tablet (50 mg total) by mouth every 12 (twelve) hours as needed for moderate pain. 12/13/18  Yes Burchette, Alinda Sierras, MD  traZODone (DESYREL) 50 MG tablet Take 1 tablet by mouth at bedtime. 01/19/19  Yes [provider]  B-D UF III MINI PEN NEEDLES 31G X 5  MM MISC  08/28/18   [provider]  Blood Glucose Monitoring Suppl (ACCU-CHEK AVIVA) device Use  to check blood sugar 6 times daily 08/08/18 08/08/19  Philemon Kingdom, MD  DOCQLACE 100 MG capsule TAKE 1 CAPSULE BY MOUTH DAILY Patient not taking: No sig reported 05/29/15   Charlett Blake, MD  glucose blood (ACCU-CHEK ACTIVE STRIPS) test strip Use to check blood sugar 6 times a day 08/08/18   Philemon Kingdom, MD  HYDROmorphone HCl (DILAUDID) 1 MG/ML LIQD  01/23/19   [provider]  hyoscyamine (LEVSIN SL) 0.125 MG SL tablet Place 1 tablet under the tongue every 2 (two) hours as needed. 01/19/19   [provider]  insulin aspart (NOVOLOG FLEXPEN) 100 UNIT/ML FlexPen INJECT 20-25 UNITS BEFORE BREAKFAST, LUNCH , DINNER Patient not taking: Reported on 01/23/2019 11/03/18   Philemon Kingdom, MD  Insulin Detemir (LEVEMIR FLEXTOUCH) 100 UNIT/ML Pen Inject 30 Units into the skin 2 (two) times daily. Patient not taking: Reported on 01/23/2019 11/03/18   Philemon Kingdom, MD  ONE [facility] LANCETS MISC Check 2 times daily. 08/16/14   Burchette, Alinda Sierras, MD  potassium chloride SA (KLOR-CON M20) 20 MEQ tablet Take 1 tablet (20 mEq total) by mouth daily as needed. Patient not taking: Reported on 01/23/2019 08/31/18 08/26/19  Croitoru, Dani Gobble, MD   Allergies  Allergen Reactions  . Morphine Shortness Of Breath and Anaphylaxis  . Morphine And Related     "took my breath away"  . Oxycodone     Pt stated, "It makes me climbs walls; fight wars"  . Robaxin [Methocarbamol] Other (See Comments)   Review of Systems  Constitutional: Positive for activity change and fatigue.  Neurological: Positive for weakness.  Psychiatric/Behavioral: Positive for confusion.   Physical Exam General: Alert, awake, in no acute distress. Does not remember events of last few days. HEENT: No bruits, no goiter, no JVD Heart: Regular rate and rhythm. No murmur appreciated. Lungs: Good air movement, clear Abdomen: Soft, nontender, nondistended, positive bowel sounds.  Ext: No significant edema Skin: Warm and  dry  Vital Signs: BP 118/74 (BP Location: Right Arm)   Pulse 88   Temp 97.7 F (36.5 C) (Oral)   Resp 16   Ht 5' 8"  (1.727 m)   Wt 96.8 kg   SpO2 96%   BMI 32.45 kg/m  Pain Scale: 0-10   Pain Score: Asleep   SpO2: SpO2: 96 % O2 Device:SpO2: 96 % O2 Flow Rate: .   IO: Intake/output summary:   Intake/Output Summary (Last 24 hours) at 01/25/2019 9675 Last data filed at 01/25/2019 0636 Gross per 24 hour  Intake 330.64 ml  Output 1200 ml  Net -869.36 ml    LBM: Last BM Date: 01/23/19 Baseline Weight: Weight: 93 kg Most recent weight: Weight: 96.8 kg     Palliative Assessment/Data:   Flowsheet Rows     Most Recent Value  Intake Tab  Referral Department  Hospitalist  Unit at Time of Referral  ICU  Palliative Care Primary Diagnosis  Cancer  Date Notified  01/24/19  Palliative Care Type  New Palliative care  Reason for referral  Clarify Goals of Care  Date of Admission  01/23/19  Date first seen by Palliative Care  01/24/19  # of days Palliative referral response time  0 Day(s)  # of days IP prior to Palliative referral  1  Clinical Assessment  Palliative Performance Scale Score  40%  Psychosocial & Spiritual Assessment  Palliative Care Outcomes  Patient/Family meeting  held?  Yes  Who was at the meeting?  Patient, wife via phone  Palliative Care Outcomes  Clarified goals of care      Time In: 1600 Time Out: 1700 Time Total: 60 Greater than 50%  of this time was spent counseling and coordinating care related to the above assessment and plan.  Signed by: Micheline Rough, MD   Please contact Palliative Medicine Team phone at [phone] for questions and concerns.  For individual provider: See Shea Evans

## 2019-01-25 NOTE — Progress Notes (Signed)
Daily Progress Note   Patient Name: Eric Price       Date: 01/25/2019 DOB: [birth year]  Age: 68 y.o. MRN#: [id number] Attending Physician: Kerney Elbe, DO Primary Care Physician: [name], MD Admit Date: 01/23/2019  Reason for Consultation/Follow-up: Establishing goals of care  Subjective: I met today with Eric Price.  He is more awake and sitting in chair.  Seems to be more understanding today, but still question if he understands fully everything that is going on.    We discussed clinical course over past couple of days.  Discussed bacteremia and options including stopping antibiotics, transition to oral antibiotics understanding that these are not definitive treatment, or pursing aggressive workup including TEE and possible ICD removal.  Eric Price reports that he thinks that the last option may be too invasive but asked me to discuss with his wife.  I called and was able to reach his wife.  She reports that he has always done poorly with oral antibiotics in the past with intolerance and them not effectively treating his infections.  She reports that she remembers being told how essential having TEE was with prior infection to help determine need for antibiotic regimen and timecourse and feels that he should undergo this again.  Length of Stay: 2  Current Medications: Scheduled Meds:  . atorvastatin  20 mg Oral q1800  . clobetasol cream  1 application Topical BID  . insulin aspart  0-9 Units Subcutaneous Q4H  . insulin detemir  5 Units Subcutaneous QHS  . pantoprazole  40 mg Oral Daily  . prednisoLONE acetate  1 drop Both Eyes Daily  . rivaroxaban  20 mg Oral Q supper  . senna-docusate  1 tablet Oral Daily  . sodium chloride flush  10-40 mL Intracatheter Q12H  .  sodium chloride flush  3 mL Intravenous Q12H    Continuous Infusions: . ampicillin (OMNIPEN) IV 2 g (01/25/19 2142)    PRN Meds: acetaminophen **OR** acetaminophen, benzonatate, HYDROcodone-acetaminophen, ondansetron **OR** ondansetron (ZOFRAN) IV, sodium chloride flush, traMADol  Physical Exam         General: Alert, awake, in no acute distress.  HEENT: No bruits, no goiter, no JVD Heart: Regular rate and rhythm. No murmur appreciated. Lungs: Good air movement, clear Abdomen: Soft, nontender, nondistended, positive bowel sounds.  Ext: No significant  edema Neuro: Grossly intact, nonfocal.   Vital Signs: BP 127/86 (BP Location: Right Arm)   Pulse 81   Temp 97.6 F (36.4 C) (Oral)   Resp 18   Ht 5' 8"  (1.727 m)   Wt 96.8 kg   SpO2 99%   BMI 32.45 kg/m  SpO2: SpO2: 99 % O2 Device: O2 Device: Room Air O2 Flow Rate:    Intake/output summary:   Intake/Output Summary (Last 24 hours) at 01/25/2019 2214 Last data filed at 01/25/2019 2049 Gross per 24 hour  Intake 1140 ml  Output 3250 ml  Net -2110 ml   LBM: Last BM Date: 01/24/19 Baseline Weight: Weight: 93 kg Most recent weight: Weight: 96.8 kg       Palliative Assessment/Data:    Flowsheet Rows     Most Recent Value  Intake Tab  Referral Department  Hospitalist  Unit at Time of Referral  ICU  Palliative Care Primary Diagnosis  Cancer  Date Notified  01/24/19  Palliative Care Type  New Palliative care  Reason for referral  Clarify Goals of Care  Date of Admission  01/23/19  Date first seen by Palliative Care  01/24/19  # of days Palliative referral response time  0 Day(s)  # of days IP prior to Palliative referral  1  Clinical Assessment  Palliative Performance Scale Score  40%  Psychosocial & Spiritual Assessment  Palliative Care Outcomes  Patient/Family meeting held?  Yes  Who was at the meeting?  Patient, wife via phone  Palliative Care Outcomes  Clarified goals of care      Patient Active Problem  List   Diagnosis Date Noted  . Sepsis ([place]) 01/23/2019  . Suspected Covid-19 Virus Infection 01/23/2019  . HCAP (healthcare-associated pneumonia) 01/23/2019  . CAP (community acquired pneumonia) 11/23/2018  . Dyslipidemia 08/31/2018  . Leukocytosis 08/24/2018  . Tachycardia 08/24/2018  . Tremor of both hands 08/24/2018  . Chronic anemia 08/24/2018  . Diabetic neuropathy ([place]) 08/24/2018  . GERD (gastroesophageal reflux disease) 08/24/2018  . Physical deconditioning 08/24/2018  . AKI (acute kidney injury) ([place]) 08/23/2018  . Abdominal distension 06/09/2018  . Electrolyte imbalance 06/09/2018  . Scrotal pain 06/09/2018  . Scrotal swelling 06/09/2018  . Symptom of wheezing 06/09/2018  . Urinary hesitancy 06/09/2018  . Muscle tension dysphonia 03/22/2018  . CKD (chronic kidney disease) stage 3, GFR 30-59 ml/min (HCC) 03/08/2018  . SIRS (systemic inflammatory response syndrome) ([place]) 03/08/2018  . Acute encephalopathy 03/08/2018  . Acute kidney injury ([place])   . Post-nasal drainage 01/19/2018  . Seasonal allergic rhinitis due to pollen 01/19/2018  . Bacteremia due to Enterococcus 07/08/2017  . Mucositis due to chemotherapy 07/08/2017  . Febrile neutropenia ([place]) 07/04/2017  . Drug-induced constipation 06/24/2017  . Thrush, oral 06/24/2017  . 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 De Quervain's tenosynovitis, left 12/29/2016  . Chronic combined systolic and diastolic CHF (congestive heart failure) ([place]) 07/17/2016  . Nonsustained ventricular tachycardia ([place]) 07/17/2016  . PAD (peripheral artery disease) ([place]) 07/17/2016  . Eczema 06/04/2016  . SI (sacroiliac) joint dysfunction 01/21/2016  . Hip pain, left 12/23/2015  . History of hypercoagulable state 12/23/2015  . Degenerative arthritis of right knee 10/28/2015  . Other specified personal risk factors, not elsewhere classified 08/27/2015  . ICD (implantable cardioverter-defibrillator) in place 08/27/2015  .  Phantom limb pain ([place]) 05/24/2015  . Memory loss   . Status post below knee amputation of left lower extremity ([place]) 03/08/2015  . S/P BKA (below knee amputation), left (  [place]) 03/08/2015  . Acquired absence of left leg below knee ([place]) 03/08/2015  . Ischemic toe 03/05/2015  . Pain in joint, ankle and foot 02/28/2015  . Cold sensation of skin-Left foot 02/28/2015  . Other disturbances of skin sensation 02/28/2015  . Nonischemic cardiomyopathy ([place]) 02/22/2015  . Coronary artery disease involving native coronary artery of native heart without angina pectoris 02/22/2015  . Embolic disease of toe ([place]) 02/22/2015  . Ischemic ulcer of toe of left foot ([place]) 02/22/2015  . 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 Diabetic foot ulcer ([place]) 02/21/2015  . Critical lower limb ischemia 02/19/2015  . Controlled type 2 diabetes mellitus with stage 3 chronic kidney disease, with long-term current use of insulin ([place]) 09/19/2014  . Hereditary and idiopathic peripheral neuropathy 09/07/2014  . 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 Primary localized osteoarthrosis, lower leg 06/19/2014  . Low back pain 05/28/2014  . Acromioclavicular joint arthritis 05/28/2014  . Dental decay 05/25/2014  . Major depressive disorder, recurrent episode ([place]) 12/11/2013  . Long-term use of immunosuppressant medication 12/04/2013  . Obesity (BMI 30-39.9) 10/31/2013  . Chest pain 10/20/2013  . Stable angina ([place]) 10/19/2013  . Type 2 diabetes mellitus with hyperglycemia ([place]) 10/19/2013  . BPH (benign prostatic hyperplasia) 10/11/2013  . Enlarged prostate without lower urinary tract symptoms ([place]) 10/11/2013  . Dyspnea 06/30/2013  . Depression 01/19/2013  . 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 History of depression 07/13/2012  . HTN (hypertension) 07/13/2012  . HLD (hyperlipidemia) 07/13/2012  . Personal history of other mental and behavioral disorders 07/13/2012  . Type 2 diabetes mellitus with  diabetic polyneuropathy ([place]) 07/13/2012    Palliative Care Assessment & Plan   Patient Profile: 68 y.o. 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  Noted prior episode bacteremia in June 2019.  Palliative consulted for [place].   Recommendations/Plan: - Continue current interventions.  He appears to be improved clinically from admission.  Continued discussion with patient and his wife regarding long term plan with recurrent bacteremia and underlying cancer.  Appreciate ID input and I reviewed this with his wife.  In speaking with them separately today, it appears that they may have different ideas on aggresivness of interventions moving forward.  They will discuss this evening.  I will reach out to Dr. Linus Salmons today to discuss as well.  Code Status:    Code Status Orders  (From admission, onward)         Start     Ordered   01/24/19 0014  Do not attempt resuscitation (DNR)  Continuous    Question Answer Comment  In the event of cardiac or respiratory ARREST Do not call a "code blue"   In the event of cardiac or respiratory ARREST Do not perform Intubation, CPR, defibrillation or ACLS   In the event of cardiac or respiratory ARREST Use medication by any route, position, wound care, and other measures to relive pain and suffering. May use oxygen, suction and manual treatment of airway obstruction as needed for comfort.      01/24/19 0013        Code Status History    Date Active Date Inactive Code Status Order ID Comments User Context   11/23/2018 1007 11/27/2018 1832 Full Code [id number]  Kayleen Memos, DO ED  08/23/2018 2109 08/26/2018 1501 Full Code [id number]  Shela Leff, MD Inpatient   03/08/2018 0339 03/14/2018 2040 Full Code [id number]  Vianne Bulls, MD ED   08/27/2015 2008  08/28/2015 1653 Full Code [id number]  [name], MD Inpatient   03/08/2015 1745 03/16/2015 1214 Full Code [id number]  Cathlyn Parsons, PA-C Inpatient   03/05/2015 1258 03/08/2015 1745 Full Code [id number]  Gabriel Earing, PA-C Inpatient   02/21/2015 1152 02/22/2015 1612 Full Code [id number]  [name], MD Inpatient   02/21/2015 0348 02/21/2015 1152 Full Code [id number]  Lily Kocher, MD Inpatient   10/19/2013 2318 10/20/2013 2034 Full Code [id number]  Orson Eva, MD Inpatient       Prognosis:   < 6 months  Discharge Planning:  Home with Hospice?  Care plan was discussed with patient, wife  Thank you for allowing the Palliative Medicine Team to assist in the care of this patient.   Total Time 55 Prolonged Time Billed No      Greater than 50%  of this time was spent counseling and coordinating care related to the above assessment and plan.  Micheline Rough, MD  Please contact Palliative Medicine Team phone at [phone] for questions and concerns.

## 2019-01-25 NOTE — Evaluation (Signed)
Occupational Therapy Evaluation Patient Details Name: Eric Price MRN: [id number] DOB: January 09, 1951 Today's Date: 01/25/2019    History of Present Illness 68 yo male admitted 01/23/19 with  fevers , hypotensive   HR up to 120s , 94% room air, dysuria .Recently Dc from [place].Marland Kitchen Hx of L BKA-wears prosthesis, CHF, PVD, DM, neuropahty, CAD   Clinical Impression   Pt was admitted for the above.  He did not have prosthesis at time of OT evaluation, but nursing is working with family to have it brought in. Pt had assistance with adls prior to admission. Will focus on toileting and standing grooming tasks at supervision to min guard level which is closer to his baseline.    Follow Up Recommendations  Supervision/Assistance - 24 hour    Equipment Recommendations  None recommended by OT    Recommendations for Other Services       Precautions / Restrictions Precautions Precautions: Fall Precaution Comments: wears L prosthesis--not here; wife to drop off Restrictions Weight Bearing Restrictions: No      Mobility Bed Mobility Overal bed mobility: Needs Assistance Bed Mobility: Supine to Sit     Supine to sit: Min guard     General bed mobility comments: extra time with use of bedrail  Transfers Overall transfer level: Needs assistance Equipment used: Rolling walker (2 wheeled) Transfers: Sit to/from Stand Sit to Stand: Min assist;+2 safety/equipment         General transfer comment: min assist to bring wt up and fwd and to balance in initial standing    Balance Overall balance assessment: Needs assistance Sitting-balance support: Feet supported Sitting balance-Leahy Scale: Good     Standing balance support: Bilateral upper extremity supported Standing balance-Leahy Scale: Poor                             ADL either performed or assessed with clinical judgement   ADL Overall ADL's : Needs assistance/impaired                         Toilet Transfer:  Minimal assistance;+2 for physical assistance;Ambulation;RW(to chair; hopping)             General ADL Comments: based on clinical judgment, pt needs set up for UB adls and mod A for LB adls.  He had assistance for this at home, but was mod I with bathroom transfers     Vision         Perception     Praxis      Pertinent Vitals/Pain Pain Assessment: No/denies pain     Hand Dominance Right   Extremity/Trunk Assessment Upper Extremity Assessment Upper Extremity Assessment: Overall WFL for tasks assessed(grossly 4/5)           Communication Communication Communication: No difficulties   Cognition Arousal/Alertness: Awake/alert Behavior During Therapy: WFL for tasks assessed/performed;Flat affect Overall Cognitive Status: Within Functional Limits for tasks assessed                                 General Comments: generally oriented    General Comments  RLE foot edema    Exercises     Shoulder Instructions      Home Living Family/patient expects to be discharged to:: Private residence Living Arrangements: Spouse/significant other Available Help at Discharge: Family;Available 24 hours/day  Bathroom Shower/Tub: Teacher, early years/pre: Standard     Home Equipment: Environmental consultant - 2 wheels;Cane - single point;Bedside commode;Tub bench;Wheelchair - manual          Prior Functioning/Environment Level of Independence: Independent with assistive device(s)        Comments: pt states wife helps with LB adls at home, but he is able to manage prosthesis himself. Also reports he has HHA 3x week for showering        OT Problem List: Decreased strength;Decreased activity tolerance;Increased edema      OT Treatment/Interventions: Self-care/ADL training;Energy conservation;DME and/or AE instruction;Patient/family education;Therapeutic activities    OT Goals(Current goals can be found in the care plan section) Acute Rehab  OT Goals Patient Stated Goal: go home OT Goal Formulation: With patient Time For Goal Achievement: 02/08/19 Potential to Achieve Goals: Good ADL Goals Pt Will Perform Grooming: (P) with supervision;standing(with prosthesis) Pt Will Transfer to Toilet: (P) with min guard assist;bedside commode;ambulating Pt Will Perform Toileting - Clothing Manipulation and hygiene: (P) with min guard assist;sit to/from stand(with prosthesis)  OT Frequency: Min 2X/week   Barriers to D/C:            Co-evaluation PT/OT/SLP Co-Evaluation/Treatment: Yes Reason for Co-Treatment: For patient/therapist safety PT goals addressed during session: Mobility/safety with mobility OT goals addressed during session: ADL's and self-care      AM-PAC OT "6 Clicks" Daily Activity     Outcome Measure Help from another person eating meals?: None Help from another person taking care of personal grooming?: A Little Help from another person toileting, which includes using toliet, bedpan, or urinal?: A Little Help from another person bathing (including washing, rinsing, drying)?: A Lot Help from another person to put on and taking off regular upper body clothing?: A Little Help from another person to put on and taking off regular lower body clothing?: A Lot 6 Click Score: 17   End of Session    Activity Tolerance: Patient tolerated treatment well Patient left: in chair;with call bell/phone within reach  OT Visit Diagnosis: Muscle weakness (generalized) (M62.81)                Time: 2947-6546 OT Time Calculation (min): 28 min Charges:  OT General Charges $OT Visit: 1 Visit OT Evaluation $OT Eval Low Complexity: 1 Low  Lesle Chris, OTR/L Acute Rehabilitation Services [phone] [facility] pager [phone] office 01/25/2019  [place] 01/25/2019, 1:50 PM

## 2019-01-25 NOTE — Progress Notes (Signed)
Physical Therapy Treatment Patient Details Name: Eric Price MRN: [id number] DOB: 05-27-1951 Today's Date: 01/25/2019    History of Present Illness 68 yo male admitted 01/23/19 with  fevers , hypotensive   HR up to 120s , 94% room air, dysuria .Recently Dc from [place].Marland Kitchen Hx of L BKA-wears prosthesis, CHF, PVD, DM, neuropahty, CAD    PT Comments    Pt in good spirits and with noted improvement in activity tolerance.  Pt able to ambulate short distance but ltd by lack of L LE prosthesis - RN contacting spouse to bring to hospital.   Follow Up Recommendations  Home health PT     Equipment Recommendations  None recommended by PT    Recommendations for Other Services       Precautions / Restrictions Precautions Precaution Comments: requires prosthesis for transfers/gait, monitor BP, malodorous wounds on abdomen that drain Restrictions Weight Bearing Restrictions: No    Mobility  Bed Mobility Overal bed mobility: Needs Assistance Bed Mobility: Supine to Sit     Supine to sit: Min guard     General bed mobility comments: extra time with use of bedrail  Transfers Overall transfer level: Needs assistance Equipment used: Rolling walker (2 wheeled) Transfers: Sit to/from Stand Sit to Stand: Min assist;+2 safety/equipment         General transfer comment: min assist to bring wt up and fwd and to balance in initial standing  Ambulation/Gait Ambulation/Gait assistance: Min assist;+2 physical assistance;+2 safety/equipment Gait Distance (Feet): 8 Feet Assistive device: Rolling walker (2 wheeled) Gait Pattern/deviations: Step-to pattern;Decreased step length - right;Shuffle     General Gait Details: cues for posture and position from RW; increased time with steady assist.   Stairs             Wheelchair Mobility    Modified Rankin (Stroke Patients Only)       Balance Overall balance assessment: Needs assistance Sitting-balance support: Feet supported Sitting  balance-Leahy Scale: Good     Standing balance support: Bilateral upper extremity supported Standing balance-Leahy Scale: Poor                              Cognition Arousal/Alertness: Awake/alert Behavior During Therapy: WFL for tasks assessed/performed;Flat affect Overall Cognitive Status: Within Functional Limits for tasks assessed                                 General Comments: generally oriented       Exercises      General Comments        Pertinent Vitals/Pain Pain Assessment: No/denies pain    Home Living Family/patient expects to be discharged to:: Private residence Living Arrangements: Spouse/significant other Available Help at Discharge: Family;Available 24 hours/day         Home Equipment: Walker - 2 wheels;Cane - single point;Bedside commode;Tub bench;Wheelchair - manual      Prior Function Level of Independence: Independent with assistive device(s)          PT Goals (current goals can now be found in the care plan section) Acute Rehab PT Goals Patient Stated Goal: go home PT Goal Formulation: With patient Time For Goal Achievement: 02/07/19 Potential to Achieve Goals: Good Progress towards PT goals: Progressing toward goals    Frequency    Min 3X/week      PT Plan Current plan remains appropriate    Co-evaluation PT/OT/SLP  Co-Evaluation/Treatment: Yes Reason for Co-Treatment: For patient/therapist safety PT goals addressed during session: Mobility/safety with mobility OT goals addressed during session: ADL's and self-care      AM-PAC PT "6 Clicks" Mobility   Outcome Measure  Help needed turning from your back to your side while in a flat bed without using bedrails?: A Little Help needed moving from lying on your back to sitting on the side of a flat bed without using bedrails?: A Little Help needed moving to and from a bed to a chair (including a wheelchair)?: A Lot Help needed standing up from a chair  using your arms (e.g., wheelchair or bedside chair)?: A Lot Help needed to walk in hospital room?: A Lot Help needed climbing 3-5 steps with a railing? : Total 6 Click Score: 13    End of Session Equipment Utilized During Treatment: Gait belt Activity Tolerance: Patient tolerated treatment well Patient left: in chair;with call bell/phone within reach;with chair alarm set Nurse Communication: Mobility status PT Visit Diagnosis: Unsteadiness on feet (R26.81)     Time: 2122-4825 PT Time Calculation (min) (ACUTE ONLY): 30 min  Charges:  $Gait Training: 8-22 mins                     [place] Pager [phone] Office [phone]    Eric Price 01/25/2019, 12:51 PM

## 2019-01-25 NOTE — Progress Notes (Signed)
Physical Therapy Treatment Patient Details Name: Eric Price MRN: [id number] DOB: Aug 02, 1951 Today's Date: 01/25/2019    History of Present Illness 68 yo male admitted 01/23/19 with  fevers , hypotensive   HR up to 120s , 94% room air, dysuria .Recently Dc from [place].Marland Kitchen Hx of L BKA-wears prosthesis, CHF, PVD, DM, neuropahty, CAD    PT Comments    Pt continues cooperative and ambulated increased distance this pm but continues ltd by absence of prosthetic L LE.   Follow Up Recommendations  Home health PT     Equipment Recommendations  None recommended by PT    Recommendations for Other Services       Precautions / Restrictions Precautions Precautions: Fall Precaution Comments: wears L prosthesis--not here; wife to drop off Restrictions Weight Bearing Restrictions: No    Mobility  Bed Mobility Overal bed mobility: Needs Assistance Bed Mobility: Sit to Supine     Supine to sit: Min guard Sit to supine: Min guard   General bed mobility comments: extra time with use of bedrail  Transfers Overall transfer level: Needs assistance Equipment used: Rolling walker (2 wheeled) Transfers: Sit to/from Stand Sit to Stand: Min assist;+2 safety/equipment         General transfer comment: min assist to bring wt up and fwd and to balance in initial standing  Ambulation/Gait Ambulation/Gait assistance: Min assist;+2 physical assistance;+2 safety/equipment Gait Distance (Feet): 15 Feet Assistive device: Rolling walker (2 wheeled) Gait Pattern/deviations: Step-to pattern;Decreased step length - right;Shuffle Gait velocity: decr   General Gait Details: cues for posture and position from RW; increased time with steady assist.   Stairs             Wheelchair Mobility    Modified Rankin (Stroke Patients Only)       Balance Overall balance assessment: Needs assistance Sitting-balance support: Feet supported Sitting balance-Leahy Scale: Good     Standing balance  support: Bilateral upper extremity supported Standing balance-Leahy Scale: Poor                              Cognition Arousal/Alertness: Awake/alert Behavior During Therapy: WFL for tasks assessed/performed;Flat affect Overall Cognitive Status: Within Functional Limits for tasks assessed                                 General Comments: generally oriented       Exercises      General Comments General comments (skin integrity, edema, etc.): RLE foot edema      Pertinent Vitals/Pain Pain Assessment: No/denies pain    Home Living Family/patient expects to be discharged to:: Private residence Living Arrangements: Spouse/significant other Available Help at Discharge: Family;Available 24 hours/day         Home Equipment: Walker - 2 wheels;Cane - single point;Bedside commode;Tub bench;Wheelchair - manual      Prior Function        Comments: pt states wife helps with LB adls at home, but he is able to manage prosthesis himself. Also reports he has HHA 3x week for showering   PT Goals (current goals can now be found in the care plan section) Acute Rehab PT Goals Patient Stated Goal: go home PT Goal Formulation: With patient Time For Goal Achievement: 02/07/19 Potential to Achieve Goals: Good Progress towards PT goals: Progressing toward goals    Frequency    Min 3X/week  PT Plan Current plan remains appropriate    Co-evaluation PT/OT/SLP Co-Evaluation/Treatment: Yes Reason for Co-Treatment: For patient/therapist safety PT goals addressed during session: Mobility/safety with mobility OT goals addressed during session: ADL's and self-care      AM-PAC PT "6 Clicks" Mobility   Outcome Measure  Help needed turning from your back to your side while in a flat bed without using bedrails?: A Little Help needed moving from lying on your back to sitting on the side of a flat bed without using bedrails?: A Little Help needed moving to and  from a bed to a chair (including a wheelchair)?: A Lot Help needed standing up from a chair using your arms (e.g., wheelchair or bedside chair)?: A Lot Help needed to walk in hospital room?: A Lot Help needed climbing 3-5 steps with a railing? : Total 6 Click Score: 13    End of Session Equipment Utilized During Treatment: Gait belt Activity Tolerance: Patient tolerated treatment well Patient left: in bed;with call bell/phone within reach;with bed alarm set Nurse Communication: Mobility status PT Visit Diagnosis: Unsteadiness on feet (R26.81)     Time: 1594-5859 PT Time Calculation (min) (ACUTE ONLY): 15 min  Charges:  $Gait Training: 8-22 mins                     [place] Pager [phone] Office [phone]    Maxwel [place] 01/25/2019, 2:25 PM

## 2019-01-26 ENCOUNTER — Inpatient Hospital Stay: Payer: Self-pay

## 2019-01-26 DIAGNOSIS — R7881 Bacteremia: Secondary | ICD-10-CM

## 2019-01-26 DIAGNOSIS — N289 Disorder of kidney and ureter, unspecified: Secondary | ICD-10-CM

## 2019-01-26 DIAGNOSIS — R6510 Systemic inflammatory response syndrome (SIRS) of non-infectious origin without acute organ dysfunction: Secondary | ICD-10-CM

## 2019-01-26 DIAGNOSIS — Z95810 Presence of automatic (implantable) cardiac defibrillator: Secondary | ICD-10-CM

## 2019-01-26 DIAGNOSIS — B952 Enterococcus as the cause of diseases classified elsewhere: Secondary | ICD-10-CM

## 2019-01-26 LAB — GLUCOSE, CAPILLARY
Glucose-Capillary: 102 mg/dL — ABNORMAL HIGH (ref 70–99)
Glucose-Capillary: 110 mg/dL — ABNORMAL HIGH (ref 70–99)
Glucose-Capillary: 115 mg/dL — ABNORMAL HIGH (ref 70–99)
Glucose-Capillary: 128 mg/dL — ABNORMAL HIGH (ref 70–99)
Glucose-Capillary: 132 mg/dL — ABNORMAL HIGH (ref 70–99)
Glucose-Capillary: 199 mg/dL — ABNORMAL HIGH (ref 70–99)

## 2019-01-26 LAB — CULTURE, BLOOD (ROUTINE X 2)
Special Requests: ADEQUATE
Special Requests: ADEQUATE

## 2019-01-26 LAB — COMPREHENSIVE METABOLIC PANEL
ALT: 10 U/L (ref 0–44)
AST: 15 U/L (ref 15–41)
Albumin: 2.8 g/dL — ABNORMAL LOW (ref 3.5–5.0)
Alkaline Phosphatase: 72 U/L (ref 38–126)
Anion gap: 7 (ref 5–15)
BUN: 28 mg/dL — ABNORMAL HIGH (ref 8–23)
CO2: 22 mmol/L (ref 22–32)
Calcium: 9.8 mg/dL (ref 8.9–10.3)
Chloride: 109 mmol/L (ref 98–111)
Creatinine, Ser: 1.6 mg/dL — ABNORMAL HIGH (ref 0.61–1.24)
GFR calc Af Amer: 51 mL/min — ABNORMAL LOW (ref >60)
GFR calc non Af Amer: 44 mL/min — ABNORMAL LOW (ref >60)
Glucose, Bld: 118 mg/dL — ABNORMAL HIGH (ref 70–99)
Potassium: 3.9 mmol/L (ref 3.5–5.1)
Sodium: 138 mmol/L (ref 135–145)
Total Bilirubin: 0.4 mg/dL (ref 0.3–1.2)
Total Protein: 5.9 g/dL — ABNORMAL LOW (ref 6.5–8.1)

## 2019-01-26 LAB — CBC WITH DIFFERENTIAL/PLATELET
Abs Immature Granulocytes: 0.2 10*3/uL — ABNORMAL HIGH (ref 0.00–0.07)
Basophils Absolute: 0 10*3/uL (ref 0.0–0.1)
Basophils Relative: 1 %
Eosinophils Absolute: 1.3 10*3/uL — ABNORMAL HIGH (ref 0.0–0.5)
Eosinophils Relative: 22 %
HCT: 27.3 % — ABNORMAL LOW (ref 39.0–52.0)
Hemoglobin: 8.8 g/dL — ABNORMAL LOW (ref 13.0–17.0)
Immature Granulocytes: 4 %
Lymphocytes Relative: 18 %
Lymphs Abs: 1.1 10*3/uL (ref 0.7–4.0)
MCH: 27.7 pg (ref 26.0–34.0)
MCHC: 32.2 g/dL (ref 30.0–36.0)
MCV: 85.8 fL (ref 80.0–100.0)
Monocytes Absolute: 0.9 10*3/uL (ref 0.1–1.0)
Monocytes Relative: 15 %
Neutro Abs: 2.4 10*3/uL (ref 1.7–7.7)
Neutrophils Relative %: 40 %
Platelets: 198 10*3/uL (ref 150–400)
RBC: 3.18 MIL/uL — ABNORMAL LOW (ref 4.22–5.81)
RDW: 17 % — ABNORMAL HIGH (ref 11.5–15.5)
WBC: 5.8 10*3/uL (ref 4.0–10.5)
nRBC: 0 % (ref 0.0–0.2)

## 2019-01-26 LAB — PHOSPHORUS: Phosphorus: 3.6 mg/dL (ref 2.5–4.6)

## 2019-01-26 LAB — MAGNESIUM: Magnesium: 1.9 mg/dL (ref 1.7–2.4)

## 2019-01-26 MED ORDER — MELATONIN 3 MG PO TABS
3.0000 mg | ORAL_TABLET | Freq: Every evening | ORAL | Status: DC | PRN
  Administered 2019-01-27: 3 mg via ORAL
  Filled 2019-01-26 (×2): qty 1

## 2019-01-26 MED ORDER — CARVEDILOL 25 MG PO TABS
25.0000 mg | ORAL_TABLET | Freq: Two times a day (BID) | ORAL | Status: DC
  Administered 2019-01-26 – 2019-01-27 (×2): 25 mg via ORAL
  Filled 2019-01-26 (×3): qty 1

## 2019-01-26 MED ORDER — SODIUM CHLORIDE 0.9 % IV SOLN
2.0000 g | Freq: Two times a day (BID) | INTRAVENOUS | Status: DC
  Administered 2019-01-26 – 2019-01-27 (×2): 2 g via INTRAVENOUS
  Filled 2019-01-26: qty 2
  Filled 2019-01-26: qty 20
  Filled 2019-01-26: qty 2

## 2019-01-26 NOTE — Progress Notes (Signed)
Daily Progress Note   Patient Name: Eric Price       Date: 01/26/2019 DOB: [birth year]  Age: 68 y.o. MRN#: [id number] Attending Physician: Kerney Elbe, DO Primary Care Physician: [name], MD Admit Date: 01/23/2019  Reason for Consultation/Follow-up: Establishing goals of care  Subjective: Met with Eric Price today.  Discussed again regarding options for care plan moving forward, and he reports discussing with Dr. Linus Salmons and wanting to proceed with plan for 6 weeks of IV abx  and forgo TEE as well as forgo consideration for ICD removal.   I called and was able to reach his wife.  Discussed above plan and she reports being agreeable but needing to know about home health care in this situation as this is not likely to be covered by hospice services.  Length of Stay: 3  Current Medications: Scheduled Meds:  . atorvastatin  20 mg Oral q1800  . carvedilol  25 mg Oral BID WC  . clobetasol cream  1 application Topical BID  . insulin aspart  0-9 Units Subcutaneous Q4H  . insulin detemir  5 Units Subcutaneous QHS  . pantoprazole  40 mg Oral Daily  . prednisoLONE acetate  1 drop Both Eyes Daily  . rivaroxaban  20 mg Oral Q supper  . senna-docusate  1 tablet Oral Daily  . sodium chloride flush  10-40 mL Intracatheter Q12H  . sodium chloride flush  3 mL Intravenous Q12H    Continuous Infusions: . ampicillin (OMNIPEN) IV 2 g (01/26/19 0942)  . cefTRIAXone (ROCEPHIN)  IV      PRN Meds: acetaminophen **OR** acetaminophen, benzonatate, HYDROcodone-acetaminophen, Melatonin, ondansetron **OR** ondansetron (ZOFRAN) IV, sodium chloride flush, traMADol  Physical Exam         General: Alert, awake, in no acute distress.  HEENT: No bruits, no goiter, no JVD Heart: Regular rate and  rhythm. No murmur appreciated. Lungs: Good air movement, clear Abdomen: Soft, nontender, nondistended, positive bowel sounds.  Ext: No significant edema Neuro: Grossly intact, nonfocal.   Vital Signs: BP 132/80 (BP Location: Right Arm)   Pulse 99   Temp 98 F (36.7 C) (Oral)   Resp 18   Ht 5' 8"  (1.727 m)   Wt 96.8 kg   SpO2 100%   BMI 32.45 kg/m  SpO2: SpO2: 100 % O2 Device:  O2 Device: Room Air O2 Flow Rate:    Intake/output summary:   Intake/Output Summary (Last 24 hours) at 01/26/2019 1644 Last data filed at 01/26/2019 1535 Gross per 24 hour  Intake 1563.08 ml  Output 2750 ml  Net -1186.92 ml   LBM: Last BM Date: 01/24/19 Baseline Weight: Weight: 93 kg Most recent weight: Weight: 96.8 kg       Palliative Assessment/Data:    Flowsheet Rows     Most Recent Value  Intake Tab  Referral Department  Hospitalist  Unit at Time of Referral  ICU  Palliative Care Primary Diagnosis  Cancer  Date Notified  01/24/19  Palliative Care Type  New Palliative care  Reason for referral  Clarify Goals of Care  Date of Admission  01/23/19  Date first seen by Palliative Care  01/24/19  # of days Palliative referral response time  0 Day(s)  # of days IP prior to Palliative referral  1  Clinical Assessment  Palliative Performance Scale Score  40%  Psychosocial & Spiritual Assessment  Palliative Care Outcomes  Patient/Family meeting held?  Yes  Who was at the meeting?  Patient, wife via phone  Palliative Care Outcomes  Clarified goals of care      Patient Active Problem List   Diagnosis Date Noted  . Sepsis ([place]) 01/23/2019  . Suspected Covid-19 Virus Infection 01/23/2019  . HCAP (healthcare-associated pneumonia) 01/23/2019  . CAP (community acquired pneumonia) 11/23/2018  . Dyslipidemia 08/31/2018  . Leukocytosis 08/24/2018  . Tachycardia 08/24/2018  . Tremor of both hands 08/24/2018  . Chronic anemia 08/24/2018  . Diabetic neuropathy ([place]) 08/24/2018  . GERD  (gastroesophageal reflux disease) 08/24/2018  . Physical deconditioning 08/24/2018  . AKI (acute kidney injury) ([place]) 08/23/2018  . Abdominal distension 06/09/2018  . Electrolyte imbalance 06/09/2018  . Scrotal pain 06/09/2018  . Scrotal swelling 06/09/2018  . Symptom of wheezing 06/09/2018  . Urinary hesitancy 06/09/2018  . Muscle tension dysphonia 03/22/2018  . CKD (chronic kidney disease) stage 3, GFR 30-59 ml/min (HCC) 03/08/2018  . SIRS (systemic inflammatory response syndrome) ([place]) 03/08/2018  . Acute encephalopathy 03/08/2018  . Acute kidney injury ([place])   . Post-nasal drainage 01/19/2018  . Seasonal allergic rhinitis due to pollen 01/19/2018  . Bacteremia due to Enterococcus 07/08/2017  . Mucositis due to chemotherapy 07/08/2017  . Febrile neutropenia ([place]) 07/04/2017  . Drug-induced constipation 06/24/2017  . Thrush, oral 06/24/2017  . 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 De Quervain's tenosynovitis, left 12/29/2016  . Chronic combined systolic and diastolic CHF (congestive heart failure) ([place]) 07/17/2016  . Nonsustained ventricular tachycardia ([place]) 07/17/2016  . PAD (peripheral artery disease) ([place]) 07/17/2016  . Eczema 06/04/2016  . SI (sacroiliac) joint dysfunction 01/21/2016  . Hip pain, left 12/23/2015  . History of hypercoagulable state 12/23/2015  . Degenerative arthritis of right knee 10/28/2015  . Other specified personal risk factors, not elsewhere classified 08/27/2015  . ICD (implantable cardioverter-defibrillator) in place 08/27/2015  . Phantom limb pain ([place]) 05/24/2015  . Memory loss   . Status post below knee amputation of left lower extremity ([place]) 03/08/2015  . S/P BKA (below knee amputation), left ([place]) 03/08/2015  . Acquired absence of left leg below knee ([place]) 03/08/2015  . Ischemic toe 03/05/2015  . Pain in joint, ankle and foot 02/28/2015  . Cold sensation of skin-Left foot 02/28/2015  . Other disturbances of skin sensation  02/28/2015  . Nonischemic cardiomyopathy ([place]) 02/22/2015  . Coronary artery disease involving native coronary artery of  native heart without angina pectoris 02/22/2015  . Embolic disease of toe ([place]) 02/22/2015  . Ischemic ulcer of toe of left foot ([place]) 02/22/2015  . 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 Diabetic foot ulcer ([place]) 02/21/2015  . Critical lower limb ischemia 02/19/2015  . Controlled type 2 diabetes mellitus with stage 3 chronic kidney disease, with long-term current use of insulin ([place]) 09/19/2014  . Hereditary and idiopathic peripheral neuropathy 09/07/2014  . 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 Primary localized osteoarthrosis, lower leg 06/19/2014  . Low back pain 05/28/2014  . Acromioclavicular joint arthritis 05/28/2014  . Dental decay 05/25/2014  . Major depressive disorder, recurrent episode ([place]) 12/11/2013  . Long-term use of immunosuppressant medication 12/04/2013  . Obesity (BMI 30-39.9) 10/31/2013  . Chest pain 10/20/2013  . Stable angina ([place]) 10/19/2013  . Type 2 diabetes mellitus with hyperglycemia ([place]) 10/19/2013  . BPH (benign prostatic hyperplasia) 10/11/2013  . Enlarged prostate without lower urinary tract symptoms ([place]) 10/11/2013  . Dyspnea 06/30/2013  . Depression 01/19/2013  . 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 History of depression 07/13/2012  . HTN (hypertension) 07/13/2012  . HLD (hyperlipidemia) 07/13/2012  . Personal history of other mental and behavioral disorders 07/13/2012  . Type 2 diabetes mellitus with diabetic polyneuropathy ([place]) 07/13/2012    Palliative Care Assessment & Plan   Patient Profile: 68 y.o. 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  Noted prior episode bacteremia in June 2019.  Palliative  consulted for [place].   Recommendations/Plan: - Continue current interventions.  Eric Price and his wife desire to pursue interventions that are most likely to add time and quality to his life while avoiding interventions that are not likely to contribute to this goal.      - Plan for 6 week course of IV abx as outlined by Dr. Linus Salmons.  Doubt that his home hospice service [facility]) will cover this and he will likely need to be set up with home health on discharge.   - Plan to forgo TEE and possible ICD removal. - If he needs to revoke hospice at home, he can always reenroll if appropriate at time of completion of IV abx.     Code Status:    Code Status Orders  (From admission, onward)         Start     Ordered   01/24/19 0014  Do not attempt resuscitation (DNR)  Continuous    Question Answer Comment  In the event of cardiac or respiratory ARREST Do not call a "code blue"   In the event of cardiac or respiratory ARREST Do not perform Intubation, CPR, defibrillation or ACLS   In the event of cardiac or respiratory ARREST Use medication by any route, position, wound care, and other measures to relive pain and suffering. May use oxygen, suction and manual treatment of airway obstruction as needed for comfort.      01/24/19 0013        Code Status History    Date Active Date Inactive Code Status Order ID Comments User Context   11/23/2018 3419 11/27/2018 1832 Full Code [id number]  Kayleen Memos, DO ED   08/23/2018 2109 08/26/2018 1501 Full Code [id number]  Shela Leff, MD Inpatient   03/08/2018 0339 03/14/2018 2040 Full Code [id number]  Vianne Bulls, MD ED   08/27/2015 2008 08/28/2015 1653 Full Code [id number]  [name], MD Inpatient   03/08/2015 1745 03/16/2015 1214 Full Code [id number]  Elizabeth Sauer Inpatient   03/05/2015 1258 03/08/2015 1745 Full Code [id number]  Natividad Brood Inpatient   02/21/2015 1152 02/22/2015 1612 Full Code [id number]  [name], MD  Inpatient   02/21/2015 0348 02/21/2015 1152 Full Code [id number]  Lily Kocher, MD Inpatient   10/19/2013 2318 10/20/2013 2034 Full Code [id number]  Orson Eva, MD Inpatient       Prognosis:   < 6 months  Discharge Planning:  Home with Hospice vs more likely Home with home health  Care plan was discussed with patient, wife  Thank you for allowing the Palliative Medicine Team to assist in the care of this patient.   Total Time 50 Prolonged Time Billed No      Greater than 50%  of this time was spent counseling and coordinating care related to the above assessment and plan.  Micheline Rough, MD  Please contact Palliative Medicine Team phone at [phone] for questions and concerns.

## 2019-01-26 NOTE — Care Management Important Message (Signed)
Important Message  Patient Details IM Letter given to the Case Manager to present to the Patient Name: Eric Price MRN: [id number] Date of Birth: 1950-11-09   Medicare Important Message Given:  Yes    Kerin Salen 01/26/2019, 10:58 AM

## 2019-01-26 NOTE — Progress Notes (Signed)
PROGRESS NOTE    Eric Price  JGG:[id number] DOB: 1951-05-14 DOA: 01/23/2019 PCP: Eric Post, MD   Brief Narrative:  HPI Per Dr. Toy Baker on 01/23/2019 Eric Price is a 68 y.o. 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  He may have had some dysuria a few days ago but the fever started only today. It was a sudden onset of illness. Wife states it was like someone took a plug out. He has been incontinent only started today, he reports it has been burning a bit with urination. Family reports mild dry cough, no chest pain. No abdominal pain, no diarrhea, reports leg aches, rigors    Patient Has hospice at home who has been seen him in the regular basis. Prior to today he was eating and drinking well but today he took nosedive and declined. No shortness of breath no nausea vomiting no diarrhea He lost his sense of smell since chemo  Has recently been admitted at [facility] for hypercalcemia and confusion Have had significant weakness generalized at that time. CT brain within normal limits calcium was found to be 14.2 decreased PTH creatinine was elevated 1.6 time chest x-ray showed mild left more than right heterogeneous opacities thought to be may be edema he was given zoledronic acid and calcitonin Zolendronic acid 4 mg IV x1 on 4/8 and Calcitonin subQ q12 for 48 hours (4/8, 4/9) & (4/13, 4/14) which decreased his Ca  from 14.2 till 9.6.  During admission pt received multiple doses of lasix (IV 80 mg) throughout admission for volume overload.   Given that his mycosis fungoides have been progressing oncology felt that the had no more options for him to offer his case was discussed with patient and his family and they agreed to hospice care patient was discharged home with home hospice.   They have come to see him a few times.  **Interim History Sepsis physiology has improved and he states he is feeling better.  Antibiotics were changed to IV ampicillin.  Palliative care for further goals of discussion care. Patient leaning toward being more aggressive and ID is concerned of a deep infection and at this time recommends treating for prolonged 6 weeks course of IV antibiotics.  Dr. Guerry Bruin does not feel that the TEE is absolutely indicated and he does not feel that he is a good candidate to have his ICD removed and just recommends treatment for endocarditis for 6 weeks of IV antibiotics and then using oral suppression with amoxicillin 500 mg p.o. 3 times daily indefinitely after that.  Currently ID recommends treatment with Ampicillin and IV ceftriaxone and Dr. Linus Salmons is going to check the cost for Daptomycin as an alternative as Vancomycin should be avoided due to Renal Fxn.   Assessment & Plan:   Active Problems:   HTN (hypertension)   HLD (hyperlipidemia)   Mycosis fungoides ([place])   Controlled type 2 diabetes mellitus with  stage 3 chronic kidney disease, with long-term current use of insulin (HCC)   Nonischemic cardiomyopathy (HCC)   Coronary artery disease involving native coronary artery of native heart without angina pectoris   S/P BKA (below knee amputation), left (HCC)   ICD (implantable cardioverter-defibrillator) in place   Chronic combined systolic and diastolic CHF (congestive heart failure) (HCC)   CKD (chronic kidney disease) stage 3, GFR 30-59 ml/min (HCC)   SIRS (systemic inflammatory response  syndrome) (HCC)   Scrotal swelling   Type 2 diabetes mellitus with diabetic polyneuropathy (HCC)   Type 2 diabetes mellitus with hyperglycemia ([place])   CAP (community acquired pneumonia)   Sepsis ([place])   Suspected Covid-19 Virus Infection   HCAP (healthcare-associated pneumonia)  Sepsis secondary to Enterococcus Faecalis Bacteremia, improving  -On admission, patient was tachycardic with tachypnea and fever -Blood cultures GPC, Enterococcus species and grew out Enterococcus Faecalis with susceptibilities to follow  -Chest x-ray showed "Interval improvement in bilateral airspace disease. Mild residual left lower lobe airspace disease. Most likely clearing pneumonia." -COVID-19 negative -Respiratory viral panel Negative  -Urine culture grew out Enterococcus Faecalis (>100,000 CFU) and 80,000 CFU of Klebsiella Pneumoniae, although UA unremarkable for infection and patient not complaining of Urinary Symptoms so likely a pyuria/asymptomatic bacteriuria  -Continue IV antibiotics but was transitioned to IV Ampicillin and now started on IV Ceftriaxone.  Of note patient did have enterococcus back in June 2019 -Infectious disease made aware of patient's admission.  Do not feel that patient warrants work-up for endocarditis at this time but that depends on how aggressive we will need to be pending Palliative Discussion as patient was recommended for Hospice. ID recommending that if he requires aggressive care he would need a TEE and AICD removal due to his persistent bacteremia along with prolonged IV antibiotics with dual therapy per ID recommendations but now feel as if TEE is not absolutely indicated and feel that he may not be a candidate for his AICD to be removed so they are emprically just going to treat for Endocarditis with Dual Therapy of Ampicillin and Ceftriaxone for now with consideration of changing to Daptomycin as an alternative if it is not cost-prohibitive; Currently avoiding Vancomcyin due to  Renal Fxn -ID recommending oral suppression with Amoxicillin 500 mg po TID  -Obtained repeat Blood Cx and they are pending  -Palliative Care Following for [place] Discussion and likely Hospice will be revoked   Mycosis Fungoides -Patient was followed by Oncology at [facility] however is no longer candidate for chemotherapy or further management.  His last admission he was sent home with Hospice -Patient currently DNR DNI -Palliative Care Medicine consulted and appreciate further evaluation and recommendations and Dr. Domingo Cocking in discussion with patient and wife. If patient is to pursue aggressive treatment (Which he feels inclined to do) likley his Hospice will be revoked  -Patient with Abdominal skin breakdown, wound care consulted  Coronary Artery Disease -Stable and currently chest pain-free -Currently his Carevdilol 37.5 mg po BID at home is actually 25 mg po BID and this has been resumed  -Resumed Atorvastatin 20 mg po Daily   Chronic Combined Systolic and Diastolic heart failure -Patient does have right lower extremity edema although does not appear to be volume overloaded; IVF with NS at 125 mL/hr now been D/C'd -Lasix was initially held given sepsis and will continue to Hold today  -Resumed Home  -Strict I's/O's, Daily Weights -Patient is -972.3 mL since Admission and patient is +8 lbs since Admission and not repeated  -  Continue to Monitor for S/Sx of Volume Overload   Acute Kidney Injury on Chronic Kidney Disease, stage III -Likely secondary to sepsis but is now improving -Patient's BUN/Cr went from 30/2.49 -> 29/2.35 -> 32/1.89 -> 28/1.60 -Was on IVF with NS at 125 mL/hr and now stopped -Continue to Monitor and Trend Renal Fxn -Repeat CMP in AM   Diabetes Mellitus, Type II -CBG's ranging from 102-199 -HbA1c was 6.4 -Continue with Insulin Detemir 5 units sq qHS and Sensitive Novolog SSI q4h -Continue to Monitor and adjust Insulin as Necessary   Hypomagnesemia -Was 1.2 pm  Admission and is now 1.9 -Continue to Monitor and Replete as Necessary -Repeat Mag Level in AM   Chronic Normocytic Anemia/Anemia of Chronic Kidney Disease -Patient's Hb/Hct went from 10.8/33.2 -> 9.7/29.6 -> 9.4/29.1 -> 8.5/26.6 -> 8.8/27.3 -In the setting of IVF Resuscitation  -Check Anemia Panel in the AM -Continue to Monitor for S/Sx of Bleeding -Repeat CBC  Hyperlipidemia -Continue Atorvastatin 20 mg po Daily   Essential Hypertension -Carvedilol was held and will now resume today -Continue to monitor closely; BP this afternoon was 132/80  Goals of Care -As above, patient was discharged from [facility] with Hospice but likely this is to be revoked pending his aggressive treatment  -Currently DNR/DNI -Palliative care consulted and appreciate further evaluation and recommendations and Dr. Domingo Cocking to discuss again with patient and the Family (specifically his wife)   Obesity -Estimated body mass index is 32.45 kg/m as calculated from the following:   Height as of this encounter: 5\' 8"  (1.727 m).   Weight as of this encounter: 96.8 kg. -Weight Loss and Dietary Counseling given   DVT prophylaxis: Anticoagulated with Rivaroxaban 20 mg po Daily  Code Status: DO NOT RESUSCITATE  Family Communication: No family present at bedside  Disposition Plan: Home Health PT when medically stable to D/C. Likley will need PICC line   Consultants:   Palliative Care Medicine  Infectious Diseases   Procedures: None   Antimicrobials:  Anti-infectives (From admission, onward)   Start     Dose/Rate Route Frequency Ordered Stop   01/25/19 1000  vancomycin (VANCOCIN) IVPB 1000 mg/200 mL premix  Status:  Discontinued     1,000 mg 200 mL/hr over 60 Minutes Intravenous Every 36 hours 01/24/19 0622 01/24/19 1119   01/24/19 2200  ampicillin (OMNIPEN) 2 g in sodium chloride 0.9 % 100 mL IVPB     2 g 300 mL/hr over 20 Minutes Intravenous Every 6 hours 01/24/19 1120      01/24/19 1000  ceFEPIme (MAXIPIME) 2 g in sodium chloride 0.9 % 100 mL IVPB  Status:  Discontinued     2 g 200 mL/hr over 30 Minutes Intravenous Every 12 hours 01/24/19 0706 01/24/19 1119   01/24/19 0030  metroNIDAZOLE (FLAGYL) IVPB 500 mg  Status:  Discontinued     500 mg 100 mL/hr over 60 Minutes Intravenous Every 8 hours 01/24/19 0013 01/24/19 1119   01/23/19 2115  vancomycin (VANCOCIN) 2,000 mg in sodium chloride 0.9 % 500 mL IVPB     2,000 mg 250 mL/hr over 120 Minutes Intravenous  Once 01/23/19 2108 01/24/19 0005   01/23/19 1930  ceFEPIme (MAXIPIME) 2 g in sodium chloride 0.9 % 100 mL IVPB     2 g 200 mL/hr over 30 Minutes Intravenous STAT 01/23/19 1926 01/23/19 2030     Subjective: Seen and examined at bedside and he was about to work with therapy and was feeling better. Denied Chest pain, lightheadedness  or dizziness.  No nausea or vomiting.  Denies any fevers.  No other concerns complaints at this time and is inclining towards aggressive treatment and wanting IV antibiotics for at least 6 weeks.  Objective: Vitals:   01/25/19 2049 01/26/19 0412 01/26/19 1406 01/26/19 1409  BP: 127/86 119/64 (!) 144/74 132/80  Pulse: 81 92 96 99  Resp: 18 15 18 18   Temp: 97.6 F (36.4 C) 97.9 F (36.6 C) 98.3 F (36.8 C) 98 F (36.7 C)  TempSrc: Oral Oral Oral Oral  SpO2: 99% 98% 100% 100%  Weight:      Height:        Intake/Output Summary (Last 24 hours) at 01/26/2019 1511 Last data filed at 01/26/2019 1115 Gross per 24 hour  Intake 1563.08 ml  Output 3200 ml  Net -1636.92 ml   Filed Weights   01/23/19 1811 01/24/19 0100  Weight: 93 kg 96.8 kg   Examination: Physical Exam:  Constitutional: Well-nourished, well-developed obese African-American male currently no acute distress appears calm and comfortable and he is feeling better daily Eyes: Lids and conjunctive are normal.  Sclera anicteric ENMT: External ears and nose appear normal.  Grossly normal hearing.  Mucous members are  moist Neck: Appears supple no JVD Respiratory: Diminished auscultation bilaterally no appreciable wheezing, rales, rhonchi.  Patient not tachypneic or using any accessory muscles to breathe Cardiovascular: Regular rate and rhythm.  No appreciable murmurs, rubs, gallops.  Has mild lower extremity edema on the right lower extremity Abdomen: Soft, nontender, distended secondary body habitus.  Bowel sounds present GU: Deferred Musculoskeletal: Has a left BKA Skin: No appreciable rashes or lesions on limited skin evaluation. Neurologic: Cranial nerves II through XII gross intact no appreciable focal deficits.  Romberg sign cerebellar reflexes were not assessed Psychiatric: Normal judgment and insight.  Patient is awake, alert, oriented x3.  Has a pleasant mood and affect.  Data Reviewed: I have personally reviewed following labs and imaging studies  CBC: Recent Labs  Lab 01/23/19 1827 01/24/19 0138 01/24/19 0321 01/25/19 0616 01/26/19 0511  WBC 8.0 9.3 9.9 5.4 5.8  NEUTROABS 4.1 4.5 4.7 2.8 2.4  HGB 10.8* 9.7* 9.4* 8.5* 8.8*  HCT 33.2* 29.6* 29.1* 26.6* 27.3*  MCV 84.5 84.1 84.3 87.2 85.8  PLT 257 186 217 159 253   Basic Metabolic Panel: Recent Labs  Lab 01/23/19 1827 01/24/19 0013 01/24/19 0150 01/24/19 0321 01/25/19 0616 01/26/19 0511  NA 133*  --   --  133* 137 138  K 4.3  --   --  3.8 4.2 3.9  CL 100  --   --  102 108 109  CO2 21*  --   --  18* 20* 22  GLUCOSE 117*  --   --  116* 140* 118*  BUN 30*  --   --  29* 32* 28*  CREATININE 2.49*  --   --  2.35* 1.89* 1.60*  CALCIUM 10.4*  --   --  9.3 9.2 9.8  MG  --  1.2*  --  1.4* 1.7 1.9  PHOS  --   --  2.8 3.2 4.1 3.6   GFR: Estimated Creatinine Clearance: 49.9 mL/min (A) (by C-G formula based on SCr of 1.6 mg/dL (H)). Liver Function Tests: Recent Labs  Lab 01/23/19 1827 01/24/19 0321 01/26/19 0511  AST 23 21 15   ALT 12 11 10   ALKPHOS 104 90 72  BILITOT 0.2* 0.6 0.4  PROT 6.3* 5.8* 5.9*  ALBUMIN 3.1* 2.9* 2.8*  No results for input(s): LIPASE, AMYLASE in the last 168 hours. No results for input(s): AMMONIA in the last 168 hours. Coagulation Profile: No results for input(s): INR, PROTIME in the last 168 hours. Cardiac Enzymes: Recent Labs  Lab 01/23/19 2140  TROPONINI <0.03   BNP (last 3 results) Recent Labs    06/15/18 1644  PROBNP 17.0   HbA1C: Recent Labs    01/24/19 0138  HGBA1C 6.4*   CBG: Recent Labs  Lab 01/25/19 2051 01/26/19 0000 01/26/19 0418 01/26/19 0740 01/26/19 1106  GLUCAP 154* 132* 115* 102* 199*   Lipid Profile: No results for input(s): CHOL, HDL, LDLCALC, TRIG, CHOLHDL, LDLDIRECT in the last 72 hours. Thyroid Function Tests: Recent Labs    01/24/19 0321  TSH 1.050   Anemia Panel: No results for input(s): VITAMINB12, FOLATE, FERRITIN, TIBC, IRON, RETICCTPCT in the last 72 hours. Sepsis Labs: Recent Labs  Lab 01/23/19 1827 01/23/19 2251 01/24/19 0138 01/24/19 0321  LATICACIDVEN 3.6* 2.7* 2.0* 2.2*    Recent Results (from the past 240 hour(s))  Blood Culture (routine x 2)     Status: Abnormal   Collection Time: 01/23/19  6:27 PM  Result Value Ref Range Status   Specimen Description   Final    RIGHT ANTECUBITAL Performed at [facility], [place] [address] [place], [place] [address]    Special Requests   Final    BOTTLES DRAWN AEROBIC AND ANAEROBIC Blood Culture adequate volume Performed at [facility] [address], [place], [place] [address]    Culture  Setup Time   Final    GRAM POSITIVE COCCI IN PAIRS IN BOTH AEROBIC AND ANAEROBIC BOTTLES CRITICAL RESULT CALLED TO, READ BACK BY AND VERIFIED WITH: Karel Jarvis PharmD 11:05 01/24/19 ([email or web address]) Performed at [facility], [place] [address], [place], [place] [address]    Culture ENTEROCOCCUS FAECALIS (A)  Final   Report Status 01/26/2019 FINAL  Final   Organism ID, Bacteria ENTEROCOCCUS FAECALIS  Final      Susceptibility   Enterococcus faecalis -  MIC*    AMPICILLIN <=2 SENSITIVE Sensitive     VANCOMYCIN 1 SENSITIVE Sensitive     GENTAMICIN SYNERGY RESISTANT Resistant     * ENTEROCOCCUS FAECALIS  Blood Culture ID Panel (Reflexed)     Status: Abnormal   Collection Time: 01/23/19  6:27 PM  Result Value Ref Range Status   Enterococcus species DETECTED (A) NOT DETECTED Final    Comment: CRITICAL RESULT CALLED TO, READ BACK BY AND VERIFIED WITH: Karel Jarvis PharmD 11:05 01/24/19 ([email or web address])    Vancomycin resistance NOT DETECTED NOT DETECTED Final   Listeria monocytogenes NOT DETECTED NOT DETECTED Final   Staphylococcus species NOT DETECTED NOT DETECTED Final   Staphylococcus aureus (BCID) NOT DETECTED NOT DETECTED Final   Streptococcus species NOT DETECTED NOT DETECTED Final   Streptococcus agalactiae NOT DETECTED NOT DETECTED Final   Streptococcus pneumoniae NOT DETECTED NOT DETECTED Final   Streptococcus pyogenes NOT DETECTED NOT DETECTED Final   Acinetobacter baumannii NOT DETECTED NOT DETECTED Final   Enterobacteriaceae species NOT DETECTED NOT DETECTED Final   Enterobacter cloacae complex NOT DETECTED NOT DETECTED Final   Escherichia coli NOT DETECTED NOT DETECTED Final   Klebsiella oxytoca NOT DETECTED NOT DETECTED Final   Klebsiella pneumoniae NOT DETECTED NOT DETECTED Final   Proteus species NOT DETECTED NOT DETECTED Final   Serratia marcescens NOT DETECTED NOT DETECTED Final   Haemophilus influenzae NOT DETECTED NOT DETECTED Final   Neisseria meningitidis  NOT DETECTED NOT DETECTED Final   Pseudomonas aeruginosa NOT DETECTED NOT DETECTED Final   Candida albicans NOT DETECTED NOT DETECTED Final   Candida glabrata NOT DETECTED NOT DETECTED Final   Candida krusei NOT DETECTED NOT DETECTED Final   Candida parapsilosis NOT DETECTED NOT DETECTED Final   Candida tropicalis NOT DETECTED NOT DETECTED Final    Comment: Performed at [facility], [place] [address] [place], [place] [address]  Blood Culture (routine x 2)     Status:  Abnormal   Collection Time: 01/23/19  6:32 PM  Result Value Ref Range Status   Specimen Description   Final    PORTA CATH Performed at [facility] [address], [place], [place] [address]    Special Requests   Final    BOTTLES DRAWN AEROBIC AND ANAEROBIC Blood Culture adequate volume Performed at [facility] [address] [place], [place] [address]    Culture  Setup Time   Final    GRAM POSITIVE COCCI IN BOTH AEROBIC AND ANAEROBIC BOTTLES CRITICAL VALUE NOTED.  VALUE IS CONSISTENT WITH PREVIOUSLY REPORTED AND CALLED VALUE.    Culture (A)  Final    ENTEROCOCCUS FAECALIS SUSCEPTIBILITIES PERFORMED ON PREVIOUS CULTURE WITHIN THE LAST 5 DAYS. Performed at [facility], [place] [address] [place], [place] [address]    Report Status 01/26/2019 FINAL  Final  Urine culture     Status: Abnormal   Collection Time: 01/23/19  8:06 PM  Result Value Ref Range Status   Specimen Description   Final    URINE, CATHETERIZED Performed at [facility] [address] [place], [place] [address]    Special Requests   Final    Immunocompromised Performed at [facility], [place] [address], [place], [place] [address]    Culture (A)  Final    >=100,000 COLONIES/mL ENTEROCOCCUS FAECALIS 80,000 COLONIES/mL KLEBSIELLA PNEUMONIAE    Report Status 01/25/2019 FINAL  Final   Organism ID, Bacteria KLEBSIELLA PNEUMONIAE (A)  Final   Organism ID, Bacteria ENTEROCOCCUS FAECALIS (A)  Final      Susceptibility   Enterococcus faecalis - MIC*    AMPICILLIN <=2 SENSITIVE Sensitive     LEVOFLOXACIN 1 SENSITIVE Sensitive     NITROFURANTOIN <=16 SENSITIVE Sensitive     VANCOMYCIN 1 SENSITIVE Sensitive     * >=100,000 COLONIES/mL ENTEROCOCCUS FAECALIS   Klebsiella pneumoniae - MIC*    AMPICILLIN >=32 RESISTANT Resistant     CEFAZOLIN 8 SENSITIVE Sensitive     CEFTRIAXONE <=1 SENSITIVE Sensitive     CIPROFLOXACIN <=0.25  SENSITIVE Sensitive     GENTAMICIN <=1 SENSITIVE Sensitive     IMIPENEM <=0.25 SENSITIVE Sensitive     NITROFURANTOIN 32 SENSITIVE Sensitive     TRIMETH/SULFA <=20 SENSITIVE Sensitive     AMPICILLIN/SULBACTAM 16 INTERMEDIATE Intermediate     PIP/TAZO <=4 SENSITIVE Sensitive     Extended ESBL NEGATIVE Sensitive     * 80,000 COLONIES/mL KLEBSIELLA PNEUMONIAE  SARS Coronavirus 2 [facility] order, Performed in [place] hospital lab)     Status: None   Collection Time: 01/23/19 10:51 PM  Result Value Ref Range Status   SARS Coronavirus 2 NEGATIVE NEGATIVE Final    Comment: (NOTE) If result is NEGATIVE SARS-CoV-2 target nucleic acids are NOT DETECTED. The SARS-CoV-2 RNA is generally detectable in upper and lower  respiratory specimens during the acute phase of infection. The lowest  concentration of SARS-CoV-2 viral copies this assay can  detect is 250  copies / mL. A negative result does not preclude SARS-CoV-2 infection  and should not be used as the sole basis for treatment or other  patient management decisions.  A negative result may occur with  improper specimen collection / handling, submission of specimen other  than nasopharyngeal swab, presence of viral mutation(s) within the  areas targeted by this assay, and inadequate number of viral copies  (<250 copies / mL). A negative result must be combined with clinical  observations, patient history, and epidemiological information. If result is POSITIVE SARS-CoV-2 target nucleic acids are DETECTED. The SARS-CoV-2 RNA is generally detectable in upper and lower  respiratory specimens dur ing the acute phase of infection.  Positive  results are indicative of active infection with SARS-CoV-2.  Clinical  correlation with patient history and other diagnostic information is  necessary to determine patient infection status.  Positive results do  not rule out bacterial infection or co-infection with other viruses. If result is PRESUMPTIVE  POSTIVE SARS-CoV-2 nucleic acids MAY BE PRESENT.   A presumptive positive result was obtained on the submitted specimen  and confirmed on repeat testing.  While 2019 novel coronavirus  (SARS-CoV-2) nucleic acids may be present in the submitted sample  additional confirmatory testing may be necessary for epidemiological  and / or clinical management purposes  to differentiate between  SARS-CoV-2 and other Sarbecovirus currently known to infect humans.  If clinically indicated additional testing with an alternate test  methodology [phone]) is advised. The SARS-CoV-2 RNA is generally  detectable in upper and lower respiratory sp ecimens during the acute  phase of infection. The expected result is Negative. Fact Sheet for Patients:  StrictlyIdeas.no Fact Sheet for Healthcare Providers: BankingDealers.co.za This test is not yet approved or cleared by the [place] FDA and has been authorized for detection and/or diagnosis of SARS-CoV-2 by FDA under an Emergency Use Authorization (EUA).  This EUA will remain in effect (meaning this test can be used) for the duration of the COVID-19 declaration under Section 564(b)(1) of the Act, 21 U.S.C. section 360bbb-3(b)(1), unless the authorization is terminated or revoked sooner. Performed at [facility], [place] [address] [place], [place] [address]   Respiratory Panel by PCR     Status: None   Collection Time: 01/24/19 12:13 AM  Result Value Ref Range Status   Adenovirus NOT DETECTED NOT DETECTED Final   Coronavirus 229E NOT DETECTED NOT DETECTED Final    Comment: (NOTE) The Coronavirus on the Respiratory Panel, DOES NOT test for the novel  Coronavirus (2019 nCoV)    Coronavirus HKU1 NOT DETECTED NOT DETECTED Final   Coronavirus NL63 NOT DETECTED NOT DETECTED Final   Coronavirus OC43 NOT DETECTED NOT DETECTED Final   Metapneumovirus NOT DETECTED NOT DETECTED Final    Rhinovirus / Enterovirus NOT DETECTED NOT DETECTED Final   Influenza A NOT DETECTED NOT DETECTED Final   Influenza B NOT DETECTED NOT DETECTED Final   Parainfluenza Virus 1 NOT DETECTED NOT DETECTED Final   Parainfluenza Virus 2 NOT DETECTED NOT DETECTED Final   Parainfluenza Virus 3 NOT DETECTED NOT DETECTED Final   Parainfluenza Virus 4 NOT DETECTED NOT DETECTED Final   Respiratory Syncytial Virus NOT DETECTED NOT DETECTED Final   Bordetella pertussis NOT DETECTED NOT DETECTED Final   Chlamydophila pneumoniae NOT DETECTED NOT DETECTED Final   Mycoplasma pneumoniae NOT DETECTED NOT DETECTED Final    Comment: Performed at [facility], [place] [address], [place], [place] [address]  Culture, blood (routine x 2)     Status: None (Preliminary result)   Collection Time: 01/24/19  1:47 PM  Result Value Ref Range Status   Specimen Description   Final    BLOOD RIGHT HAND Performed at [facility] [address], [place], [place] [address]    Special Requests   Final    BOTTLES DRAWN AEROBIC AND ANAEROBIC Blood Culture adequate volume Performed at [facility] [address] [place], [place] [address]    Culture   Final    NO GROWTH 2 DAYS Performed at [facility] [address], [place], [place] [address]    Report Status PENDING  Incomplete  MRSA PCR Screening     Status: None   Collection Time: 01/24/19  6:30 PM  Result Value Ref Range Status   MRSA by PCR NEGATIVE NEGATIVE Final    Comment:        The GeneXpert MRSA Assay (FDA approved for NASAL specimens only), is one component of a comprehensive MRSA colonization surveillance program. It is not intended to diagnose MRSA infection nor to guide or monitor treatment for MRSA infections. Performed at [facility], [place] [address] [place], [place] [address]    Radiology Studies: No results found. Scheduled Meds:  atorvastatin  20 mg Oral q1800    clobetasol cream  1 application Topical BID   insulin aspart  0-9 Units Subcutaneous Q4H   insulin detemir  5 Units Subcutaneous QHS   pantoprazole  40 mg Oral Daily   prednisoLONE acetate  1 drop Both Eyes Daily   rivaroxaban  20 mg Oral Q supper   senna-docusate  1 tablet Oral Daily   sodium chloride flush  10-40 mL Intracatheter Q12H   sodium chloride flush  3 mL Intravenous Q12H   Continuous Infusions:  ampicillin (OMNIPEN) IV 2 g (01/26/19 0942)    LOS: 3 days   Kerney Elbe, DO [facility] PAGER is on AMION  If 7PM-7AM, please contact night-coverage [email or web address] Password [email or web address] 01/26/2019, 3:11 PM

## 2019-01-26 NOTE — Progress Notes (Signed)
Physical Therapy Treatment Patient Details Name: Eric Price MRN: [id number] DOB: 23-Jan-1951 Today's Date: 01/26/2019    History of Present Illness 68 yo male admitted 01/23/19 with  fevers , hypotensive   HR up to 120s , 94% room air, dysuria .Recently Dc from [place].Marland Kitchen Hx of L BKA-wears prosthesis, CHF, PVD, DM, neuropahty, CAD    PT Comments    Pt in good spirits and with marked improvement in activity tolerance and stability 2* arrival or L LE prosthesis.     Follow Up Recommendations  Home health PT     Equipment Recommendations  None recommended by PT    Recommendations for Other Services       Precautions / Restrictions Precautions Precautions: Fall Precaution Comments: wears L prosthesis Restrictions Weight Bearing Restrictions: No    Mobility  Bed Mobility Overal bed mobility: Needs Assistance Bed Mobility: Supine to Sit     Supine to sit: Supervision     General bed mobility comments: extra time with use of bedrail  Transfers Overall transfer level: Needs assistance Equipment used: Rolling walker (2 wheeled) Transfers: Sit to/from Stand Sit to Stand: Min assist         General transfer comment: min assist to bring wt up and fwd and to balance in initial standing  Ambulation/Gait Ambulation/Gait assistance: Min assist;Min guard Gait Distance (Feet): 400 Feet Assistive device: Rolling walker (2 wheeled) Gait Pattern/deviations: Decreased step length - right;Shuffle;Step-through pattern;Trunk flexed     General Gait Details: cues for posture and position from RW;   Chief Strategy Officer    Modified Rankin (Stroke Patients Only)       Balance Overall balance assessment: Needs assistance Sitting-balance support: Feet supported Sitting balance-Leahy Scale: Good     Standing balance support: Bilateral upper extremity supported Standing balance-Leahy Scale: Fair                              Cognition  Arousal/Alertness: Awake/alert Behavior During Therapy: WFL for tasks assessed/performed;Flat affect Overall Cognitive Status: Within Functional Limits for tasks assessed                                        Exercises      General Comments        Pertinent Vitals/Pain Pain Assessment: No/denies pain    Home Living                      Prior Function            PT Goals (current goals can now be found in the care plan section) Acute Rehab PT Goals Patient Stated Goal: go home PT Goal Formulation: With patient Time For Goal Achievement: 02/07/19 Potential to Achieve Goals: Good Progress towards PT goals: Progressing toward goals    Frequency    Min 3X/week      PT Plan Current plan remains appropriate    Co-evaluation              AM-PAC PT "6 Clicks" Mobility   Outcome Measure  Help needed turning from your back to your side while in a flat bed without using bedrails?: None Help needed moving from lying on your back to sitting on the side of a flat bed without using bedrails?:  A Little Help needed moving to and from a bed to a chair (including a wheelchair)?: A Little Help needed standing up from a chair using your arms (e.g., wheelchair or bedside chair)?: A Little Help needed to walk in hospital room?: A Little Help needed climbing 3-5 steps with a railing? : A Lot 6 Click Score: 18    End of Session Equipment Utilized During Treatment: Gait belt Activity Tolerance: Patient tolerated treatment well Patient left: in chair;with call bell/phone within reach;with chair alarm set Nurse Communication: Mobility status PT Visit Diagnosis: Unsteadiness on feet (R26.81)     Time: 9563-8756 PT Time Calculation (min) (ACUTE ONLY): 28 min  Charges:  $Gait Training: 23-37 mins                     [place] Pager [phone] Office [phone]    Teller Wakefield 01/26/2019, 10:56  AM

## 2019-01-26 NOTE — Progress Notes (Signed)
[facility] for Infectious Disease   Reason for visit: Follow up on bacteremia  Interval History: repeat blood culture ngtd; afebrile, WBC wnl.  No complaints. Just got done with PT and walked down the halls.  No associated rash or diarrhea.    Physical Exam: Constitutional:  Vitals:   01/25/19 2049 01/26/19 0412  BP: 127/86 119/64  Pulse: 81 92  Resp: 18 15  Temp: 97.6 F (36.4 C) 97.9 F (36.6 C)  SpO2: 99% 98%   patient appears in NAD Eyes: anicteric HENT: no thrush Respiratory: Normal respiratory effort; CTA B Cardiovascular: RRR GI: soft, nt, nd  Review of Systems: Constitutional: negative for fevers, chills and anorexia Gastrointestinal: negative for diarrhea Integument/breast: negative for rash Musculoskeletal: negative for myalgias and arthralgias  Lab Results  Component Value Date   WBC 5.8 01/26/2019   HGB 8.8 (L) 01/26/2019   HCT 27.3 (L) 01/26/2019   MCV 85.8 01/26/2019   PLT 198 01/26/2019    Lab Results  Component Value Date   CREATININE 1.60 (H) 01/26/2019   BUN 28 (H) 01/26/2019   NA 138 01/26/2019   K 3.9 01/26/2019   CL 109 01/26/2019   CO2 22 01/26/2019    Lab Results  Component Value Date   ALT 10 01/26/2019   AST 15 01/26/2019   ALKPHOS 72 01/26/2019     Microbiology: Recent Results (from the past 240 hour(s))  Blood Culture (routine x 2)     Status: Abnormal   Collection Time: 01/23/19  6:27 PM  Result Value Ref Range Status   Specimen Description   Final    RIGHT ANTECUBITAL Performed at [facility], [place] [address], [place], [place] [address]    Special Requests   Final    BOTTLES DRAWN AEROBIC AND ANAEROBIC Blood Culture adequate volume Performed at [facility] [address], [place], [place] [address]    Culture  Setup Time   Final    GRAM POSITIVE COCCI IN PAIRS IN BOTH AEROBIC AND ANAEROBIC BOTTLES CRITICAL RESULT CALLED TO, READ BACK BY AND VERIFIED WITH: Karel Jarvis  PharmD 11:05 01/24/19 ([email or web address]) Performed at [facility], [place] [address] [place], [place] [address]    Culture ENTEROCOCCUS FAECALIS (A)  Final   Report Status 01/26/2019 FINAL  Final   Organism ID, Bacteria ENTEROCOCCUS FAECALIS  Final      Susceptibility   Enterococcus faecalis - MIC*    AMPICILLIN <=2 SENSITIVE Sensitive     VANCOMYCIN 1 SENSITIVE Sensitive     GENTAMICIN SYNERGY RESISTANT Resistant     * ENTEROCOCCUS FAECALIS  Blood Culture ID Panel (Reflexed)     Status: Abnormal   Collection Time: 01/23/19  6:27 PM  Result Value Ref Range Status   Enterococcus species DETECTED (A) NOT DETECTED Final    Comment: CRITICAL RESULT CALLED TO, READ BACK BY AND VERIFIED WITH: Karel Jarvis PharmD 11:05 01/24/19 ([email or web address])    Vancomycin resistance NOT DETECTED NOT DETECTED Final   Listeria monocytogenes NOT DETECTED NOT DETECTED Final   Staphylococcus species NOT DETECTED NOT DETECTED Final   Staphylococcus aureus (BCID) NOT DETECTED NOT DETECTED Final   Streptococcus species NOT DETECTED NOT DETECTED Final   Streptococcus agalactiae NOT DETECTED NOT DETECTED Final   Streptococcus pneumoniae NOT DETECTED NOT DETECTED Final   Streptococcus pyogenes NOT DETECTED NOT DETECTED Final   Acinetobacter baumannii NOT DETECTED NOT DETECTED Final   Enterobacteriaceae species NOT DETECTED NOT DETECTED Final   Enterobacter  cloacae complex NOT DETECTED NOT DETECTED Final   Escherichia coli NOT DETECTED NOT DETECTED Final   Klebsiella oxytoca NOT DETECTED NOT DETECTED Final   Klebsiella pneumoniae NOT DETECTED NOT DETECTED Final   Proteus species NOT DETECTED NOT DETECTED Final   Serratia marcescens NOT DETECTED NOT DETECTED Final   Haemophilus influenzae NOT DETECTED NOT DETECTED Final   Neisseria meningitidis NOT DETECTED NOT DETECTED Final   Pseudomonas aeruginosa NOT DETECTED NOT DETECTED Final   Candida albicans NOT DETECTED NOT DETECTED Final   Candida glabrata NOT DETECTED NOT DETECTED  Final   Candida krusei NOT DETECTED NOT DETECTED Final   Candida parapsilosis NOT DETECTED NOT DETECTED Final   Candida tropicalis NOT DETECTED NOT DETECTED Final    Comment: Performed at [facility], [place] [address] [place], [place] [address]  Blood Culture (routine x 2)     Status: Abnormal   Collection Time: 01/23/19  6:32 PM  Result Value Ref Range Status   Specimen Description   Final    PORTA CATH Performed at [facility] [address], [place], [place] [address]    Special Requests   Final    BOTTLES DRAWN AEROBIC AND ANAEROBIC Blood Culture adequate volume Performed at [facility] [address], [place], [place] [address]    Culture  Setup Time   Final    GRAM POSITIVE COCCI IN BOTH AEROBIC AND ANAEROBIC BOTTLES CRITICAL VALUE NOTED.  VALUE IS CONSISTENT WITH PREVIOUSLY REPORTED AND CALLED VALUE.    Culture (A)  Final    ENTEROCOCCUS FAECALIS SUSCEPTIBILITIES PERFORMED ON PREVIOUS CULTURE WITHIN THE LAST 5 DAYS. Performed at [facility], [place] [address] [place], [place] [address]    Report Status 01/26/2019 FINAL  Final  Urine culture     Status: Abnormal   Collection Time: 01/23/19  8:06 PM  Result Value Ref Range Status   Specimen Description   Final    URINE, CATHETERIZED Performed at [facility] [address], [place], [place] [address]    Special Requests   Final    Immunocompromised Performed at [facility], [place] [address] [place], [place] [address]    Culture (A)  Final    >=100,000 COLONIES/mL ENTEROCOCCUS FAECALIS 80,000 COLONIES/mL KLEBSIELLA PNEUMONIAE    Report Status 01/25/2019 FINAL  Final   Organism ID, Bacteria KLEBSIELLA PNEUMONIAE (A)  Final   Organism ID, Bacteria ENTEROCOCCUS FAECALIS (A)  Final      Susceptibility   Enterococcus faecalis - MIC*    AMPICILLIN <=2 SENSITIVE Sensitive     LEVOFLOXACIN 1 SENSITIVE Sensitive     NITROFURANTOIN  <=16 SENSITIVE Sensitive     VANCOMYCIN 1 SENSITIVE Sensitive     * >=100,000 COLONIES/mL ENTEROCOCCUS FAECALIS   Klebsiella pneumoniae - MIC*    AMPICILLIN >=32 RESISTANT Resistant     CEFAZOLIN 8 SENSITIVE Sensitive     CEFTRIAXONE <=1 SENSITIVE Sensitive     CIPROFLOXACIN <=0.25 SENSITIVE Sensitive     GENTAMICIN <=1 SENSITIVE Sensitive     IMIPENEM <=0.25 SENSITIVE Sensitive     NITROFURANTOIN 32 SENSITIVE Sensitive     TRIMETH/SULFA <=20 SENSITIVE Sensitive     AMPICILLIN/SULBACTAM 16 INTERMEDIATE Intermediate     PIP/TAZO <=4 SENSITIVE Sensitive     Extended ESBL NEGATIVE Sensitive     * 80,000 COLONIES/mL KLEBSIELLA PNEUMONIAE  SARS Coronavirus 2 [facility] order, Performed in [place] hospital lab)     Status: None  Collection Time: 01/23/19 10:51 PM  Result Value Ref Range Status   SARS Coronavirus 2 NEGATIVE NEGATIVE Final    Comment: (NOTE) If result is NEGATIVE SARS-CoV-2 target nucleic acids are NOT DETECTED. The SARS-CoV-2 RNA is generally detectable in upper and lower  respiratory specimens during the acute phase of infection. The lowest  concentration of SARS-CoV-2 viral copies this assay can detect is 250  copies / mL. A negative result does not preclude SARS-CoV-2 infection  and should not be used as the sole basis for treatment or other  patient management decisions.  A negative result may occur with  improper specimen collection / handling, submission of specimen other  than nasopharyngeal swab, presence of viral mutation(s) within the  areas targeted by this assay, and inadequate number of viral copies  (<250 copies / mL). A negative result must be combined with clinical  observations, patient history, and epidemiological information. If result is POSITIVE SARS-CoV-2 target nucleic acids are DETECTED. The SARS-CoV-2 RNA is generally detectable in upper and lower  respiratory specimens dur ing the acute phase of infection.  Positive  results are  indicative of active infection with SARS-CoV-2.  Clinical  correlation with patient history and other diagnostic information is  necessary to determine patient infection status.  Positive results do  not rule out bacterial infection or co-infection with other viruses. If result is PRESUMPTIVE POSTIVE SARS-CoV-2 nucleic acids MAY BE PRESENT.   A presumptive positive result was obtained on the submitted specimen  and confirmed on repeat testing.  While 2019 novel coronavirus  (SARS-CoV-2) nucleic acids may be present in the submitted sample  additional confirmatory testing may be necessary for epidemiological  and / or clinical management purposes  to differentiate between  SARS-CoV-2 and other Sarbecovirus currently known to infect humans.  If clinically indicated additional testing with an alternate test  methodology [id number]) is advised. The SARS-CoV-2 RNA is generally  detectable in upper and lower respiratory sp ecimens during the acute  phase of infection. The expected result is Negative. Fact Sheet for Patients:  StrictlyIdeas.no Fact Sheet for Healthcare Providers: BankingDealers.co.za This test is not yet approved or cleared by the [place] FDA and has been authorized for detection and/or diagnosis of SARS-CoV-2 by FDA under an Emergency Use Authorization (EUA).  This EUA will remain in effect (meaning this test can be used) for the duration of the COVID-19 declaration under Section 564(b)(1) of the Act, 21 U.S.C. section 360bbb-3(b)(1), unless the authorization is terminated or revoked sooner. Performed at [facility], [place] [address] [place], [place] [address]   Respiratory Panel by PCR     Status: None   Collection Time: 01/24/19 12:13 AM  Result Value Ref Range Status   Adenovirus NOT DETECTED NOT DETECTED Final   Coronavirus 229E NOT DETECTED NOT DETECTED Final    Comment: (NOTE) The Coronavirus on  the Respiratory Panel, DOES NOT test for the novel  Coronavirus (2019 nCoV)    Coronavirus HKU1 NOT DETECTED NOT DETECTED Final   Coronavirus NL63 NOT DETECTED NOT DETECTED Final   Coronavirus OC43 NOT DETECTED NOT DETECTED Final   Metapneumovirus NOT DETECTED NOT DETECTED Final   Rhinovirus / Enterovirus NOT DETECTED NOT DETECTED Final   Influenza A NOT DETECTED NOT DETECTED Final   Influenza B NOT DETECTED NOT DETECTED Final   Parainfluenza Virus 1 NOT DETECTED NOT DETECTED Final   Parainfluenza Virus 2 NOT DETECTED NOT DETECTED Final   Parainfluenza Virus 3 NOT DETECTED NOT DETECTED  Final   Parainfluenza Virus 4 NOT DETECTED NOT DETECTED Final   Respiratory Syncytial Virus NOT DETECTED NOT DETECTED Final   Bordetella pertussis NOT DETECTED NOT DETECTED Final   Chlamydophila pneumoniae NOT DETECTED NOT DETECTED Final   Mycoplasma pneumoniae NOT DETECTED NOT DETECTED Final    Comment: Performed at [facility], [place] [address] [place], [place] [address]  Culture, blood (routine x 2)     Status: None (Preliminary result)   Collection Time: 01/24/19  1:47 PM  Result Value Ref Range Status   Specimen Description   Final    BLOOD RIGHT HAND Performed at [facility] [address], [place], [place] [address]    Special Requests   Final    BOTTLES DRAWN AEROBIC AND ANAEROBIC Blood Culture adequate volume Performed at [facility] [address], [place], [place] [address]    Culture   Final    NO GROWTH 2 DAYS Performed at [facility] [address], [place], [place] [address]    Report Status PENDING  Incomplete  MRSA PCR Screening     Status: None   Collection Time: 01/24/19  6:30 PM  Result Value Ref Range Status   MRSA by PCR NEGATIVE NEGATIVE Final    Comment:        The GeneXpert MRSA Assay (FDA approved for NASAL specimens only), is one component of a comprehensive MRSA colonization surveillance program. It is not  intended to diagnose MRSA infection nor to guide or monitor treatment for MRSA infections. Performed at [facility], [place] [address], [place],  [address]     Impression/Plan:  1. Bacteremia - with recurrent Enterococcus, I am concerned of a deep infection.  I would treat this as a prolonged 6 week course if full treatment desired.  I do not think a TEE is absolutely indicated as I do not feel he is a good candidate to have his ICD removed, which he is dependent on and otherwise can just treat for endocarditis.  I would then use oral suppression with amoxicillin 500 mg three times a day indefinitely after that.  Treatment will be with ampicillin and ceftriaxone.  I will check for cost of daptomycin as an alternative instead but likely to be cost-prohibitive.  2.  Renal insufficiency - improved creat.  Would avoid vancomycin as an alternative.   3.  Palliative care - discussed with Dr. Domingo Cocking above.  Hospice unlikely to cover #1 so may be revoked if the family wants to go this route.  I discussed this with the patient and that is his wish, to do #1.

## 2019-01-26 NOTE — Progress Notes (Signed)
Physical Therapy Treatment Patient Details Name: Eric Price MRN: [id number] DOB: 02-Dec-1950 Today's Date: 01/26/2019    History of Present Illness 68 yo male admitted 01/23/19 with  fevers , hypotensive   HR up to 120s , 94% room air, dysuria .Recently Dc from [place].Marland Kitchen Hx of L BKA-wears prosthesis, CHF, PVD, DM, neuropahty, CAD    PT Comments    Pt continues motivated and with improvement noted in quality of all mobility tasks.  Pt eager for dc home with spouse.   Follow Up Recommendations  Home health PT     Equipment Recommendations  None recommended by PT    Recommendations for Other Services       Precautions / Restrictions Precautions Precautions: Fall Precaution Comments: wears L prosthesis Restrictions Weight Bearing Restrictions: No    Mobility  Bed Mobility Overal bed mobility: Needs Assistance Bed Mobility: Sit to Supine     Supine to sit: Supervision Sit to supine: Supervision   General bed mobility comments: extra time with use of bedrail  Transfers Overall transfer level: Needs assistance Equipment used: Rolling walker (2 wheeled) Transfers: Sit to/from Stand Sit to Stand: Min guard         General transfer comment: min cues for use of UEs to self assist  Ambulation/Gait Ambulation/Gait assistance: Min assist;Min guard Gait Distance (Feet): 400 Feet Assistive device: Rolling walker (2 wheeled) Gait Pattern/deviations: Decreased step length - right;Shuffle;Step-through pattern;Trunk flexed Gait velocity: decr   General Gait Details: cues for posture and position from RW; min assist for balance only while pt donning mask in standing   Stairs             Wheelchair Mobility    Modified Rankin (Stroke Patients Only)       Balance Overall balance assessment: Needs assistance Sitting-balance support: Feet supported Sitting balance-Leahy Scale: Good     Standing balance support: No upper extremity supported Standing balance-Leahy  Scale: Fair                              Cognition Arousal/Alertness: Awake/alert Behavior During Therapy: WFL for tasks assessed/performed;Flat affect Overall Cognitive Status: Within Functional Limits for tasks assessed                                        Exercises      General Comments General comments (skin integrity, edema, etc.): L stump swelling noted reduced after wearing prosthesis sleeve x 4 hrs      Pertinent Vitals/Pain Pain Assessment: No/denies pain    Home Living                      Prior Function            PT Goals (current goals can now be found in the care plan section) Acute Rehab PT Goals Patient Stated Goal: go home PT Goal Formulation: With patient Time For Goal Achievement: 02/07/19 Potential to Achieve Goals: Good Progress towards PT goals: Progressing toward goals    Frequency    Min 3X/week      PT Plan Current plan remains appropriate    Co-evaluation              AM-PAC PT "6 Clicks" Mobility   Outcome Measure  Help needed turning from your back to your side while in a flat  bed without using bedrails?: None Help needed moving from lying on your back to sitting on the side of a flat bed without using bedrails?: A Little Help needed moving to and from a bed to a chair (including a wheelchair)?: A Little Help needed standing up from a chair using your arms (e.g., wheelchair or bedside chair)?: A Little Help needed to walk in hospital room?: A Little Help needed climbing 3-5 steps with a railing? : A Lot 6 Click Score: 15    End of Session Equipment Utilized During Treatment: Gait belt Activity Tolerance: Patient tolerated treatment well Patient left: in bed;with call bell/phone within reach;with bed alarm set Nurse Communication: Mobility status PT Visit Diagnosis: Unsteadiness on feet (R26.81)     Time: 7741-2878 PT Time Calculation (min) (ACUTE ONLY): 27 min  Charges:  $Gait  Training: 23-37 mins                     [place] Pager [phone] Office [phone]    Denisse Whitenack 01/26/2019, 1:39 PM

## 2019-01-27 DIAGNOSIS — N289 Disorder of kidney and ureter, unspecified: Secondary | ICD-10-CM

## 2019-01-27 LAB — COMPREHENSIVE METABOLIC PANEL
ALT: 12 U/L (ref 0–44)
AST: 16 U/L (ref 15–41)
Albumin: 2.9 g/dL — ABNORMAL LOW (ref 3.5–5.0)
Alkaline Phosphatase: 71 U/L (ref 38–126)
Anion gap: 8 (ref 5–15)
BUN: 25 mg/dL — ABNORMAL HIGH (ref 8–23)
CO2: 22 mmol/L (ref 22–32)
Calcium: 10.1 mg/dL (ref 8.9–10.3)
Chloride: 108 mmol/L (ref 98–111)
Creatinine, Ser: 1.66 mg/dL — ABNORMAL HIGH (ref 0.61–1.24)
GFR calc Af Amer: 48 mL/min — ABNORMAL LOW (ref >60)
GFR calc non Af Amer: 42 mL/min — ABNORMAL LOW (ref >60)
Glucose, Bld: 105 mg/dL — ABNORMAL HIGH (ref 70–99)
Potassium: 4 mmol/L (ref 3.5–5.1)
Sodium: 138 mmol/L (ref 135–145)
Total Bilirubin: 0.3 mg/dL (ref 0.3–1.2)
Total Protein: 5.8 g/dL — ABNORMAL LOW (ref 6.5–8.1)

## 2019-01-27 LAB — CBC WITH DIFFERENTIAL/PLATELET
Abs Immature Granulocytes: 0.14 10*3/uL — ABNORMAL HIGH (ref 0.00–0.07)
Basophils Absolute: 0.1 10*3/uL (ref 0.0–0.1)
Basophils Relative: 1 %
Eosinophils Absolute: 1.5 10*3/uL — ABNORMAL HIGH (ref 0.0–0.5)
Eosinophils Relative: 26 %
HCT: 29 % — ABNORMAL LOW (ref 39.0–52.0)
Hemoglobin: 9 g/dL — ABNORMAL LOW (ref 13.0–17.0)
Immature Granulocytes: 3 %
Lymphocytes Relative: 16 %
Lymphs Abs: 0.9 10*3/uL (ref 0.7–4.0)
MCH: 26.9 pg (ref 26.0–34.0)
MCHC: 31 g/dL (ref 30.0–36.0)
MCV: 86.6 fL (ref 80.0–100.0)
Monocytes Absolute: 1 10*3/uL (ref 0.1–1.0)
Monocytes Relative: 17 %
Neutro Abs: 2.1 10*3/uL (ref 1.7–7.7)
Neutrophils Relative %: 37 %
Platelets: 244 10*3/uL (ref 150–400)
RBC: 3.35 MIL/uL — ABNORMAL LOW (ref 4.22–5.81)
RDW: 16.6 % — ABNORMAL HIGH (ref 11.5–15.5)
WBC: 5.6 10*3/uL (ref 4.0–10.5)
nRBC: 0 % (ref 0.0–0.2)

## 2019-01-27 LAB — GLUCOSE, CAPILLARY
Glucose-Capillary: 101 mg/dL — ABNORMAL HIGH (ref 70–99)
Glucose-Capillary: 115 mg/dL — ABNORMAL HIGH (ref 70–99)
Glucose-Capillary: 93 mg/dL (ref 70–99)
Glucose-Capillary: 97 mg/dL (ref 70–99)

## 2019-01-27 LAB — CREATININE, SERUM
Creatinine, Ser: 1.55 mg/dL — ABNORMAL HIGH (ref 0.61–1.24)
GFR calc Af Amer: 53 mL/min — ABNORMAL LOW (ref >60)
GFR calc non Af Amer: 45 mL/min — ABNORMAL LOW (ref >60)

## 2019-01-27 LAB — CK: Total CK: 7 U/L — ABNORMAL LOW (ref 49–397)

## 2019-01-27 LAB — PHOSPHORUS: Phosphorus: 3.8 mg/dL (ref 2.5–4.6)

## 2019-01-27 LAB — MAGNESIUM: Magnesium: 1.6 mg/dL — ABNORMAL LOW (ref 1.7–2.4)

## 2019-01-27 MED ORDER — SODIUM CHLORIDE 0.9 % IV SOLN
2.0000 g | INTRAVENOUS | Status: DC
  Administered 2019-01-27: 12:00:00 2 g via INTRAVENOUS
  Filled 2019-01-27: qty 2
  Filled 2019-01-27 (×2): qty 2000

## 2019-01-27 MED ORDER — HEPARIN SOD (PORK) LOCK FLUSH 100 UNIT/ML IV SOLN
250.0000 [IU] | INTRAVENOUS | Status: AC | PRN
  Administered 2019-01-27: 250 [IU]

## 2019-01-27 MED ORDER — DIPHENHYDRAMINE HCL 25 MG PO CAPS: 25.0000 mg | ORAL_CAPSULE | Freq: Once | ORAL | Status: DC

## 2019-01-27 MED ORDER — DAPTOMYCIN IV (FOR PTA / DISCHARGE USE ONLY): 800.0000 mg | INTRAVENOUS | 0 refills | Status: DC

## 2019-01-27 MED ORDER — SODIUM CHLORIDE 0.9% FLUSH: 10.0000 mL | INTRAVENOUS | Status: DC | PRN

## 2019-01-27 MED ORDER — SODIUM CHLORIDE 0.9 % IV SOLN
800.0000 mg | Freq: Every day | INTRAVENOUS | Status: DC
  Administered 2019-01-27: 14:00:00 800 mg via INTRAVENOUS
  Filled 2019-01-27: qty 16

## 2019-01-27 MED ORDER — MAGNESIUM SULFATE 2 GM/50ML IV SOLN
2.0000 g | Freq: Once | INTRAVENOUS | Status: AC
  Administered 2019-01-27: 11:00:00 2 g via INTRAVENOUS
  Filled 2019-01-27: qty 50

## 2019-01-27 NOTE — Consult Note (Signed)
   [facility] CM Inpatient Consult   01/27/2019  Eric Price May 01, 1951 [id number]  Patient chart reviewed for unplanned readmissions risk score of 39%, extreme, and 3 hospitalizations within past 6 months. [name] had been active in the past with [facility] CM for chronic disease management education.  Chart review reveals current disposition plan has at home hospice. No [facility] CM needs.  Netta Cedars, MSN, [facility] Liaison Nurse Mobile Phone [phone]  Toll free office [phone]

## 2019-01-27 NOTE — Progress Notes (Signed)
Pt alert and oriented, tolerating diet.  Home Health was set up. D/C instructions were given and pt was d/cd home.

## 2019-01-27 NOTE — Progress Notes (Signed)
OT Cancellation Note  Patient Details Name: Eric Price MRN: [id number] DOB: March 13, 1951   Cancelled Treatment:    Reason Eval/Treat Not Completed: Patient at procedure or test/ unavailable.  Pt is getting a PICC. Will try to return after xray is taken/placement confirmed  Pasha Broad 01/27/2019, 9:04 AM  Lesle Chris, OTR/L Acute Rehabilitation Services [phone] [facility] pager [phone] office 01/27/2019

## 2019-01-27 NOTE — Progress Notes (Signed)
Peripherally Inserted Central Catheter/Midline Placement  The IV Nurse has discussed with the patient and/or persons authorized to consent for the patient, the purpose of this procedure and the potential benefits and risks involved with this procedure.  The benefits include less needle sticks, lab draws from the catheter, and the patient may be discharged home with the catheter. Risks include, but not limited to, infection, bleeding, blood clot (thrombus formation), and puncture of an artery; nerve damage and irregular heartbeat and possibility to perform a PICC exchange if needed/ordered by physician.  Alternatives to this procedure were also discussed.  Bard Power PICC patient education guide, fact sheet on infection prevention and patient information card has been provided to patient /or left at bedside.    PICC/Midline Placement Documentation  PICC Single Lumen 01/27/19 PICC Right Cephalic 42 cm 1 cm (Active)  Indication for Insertion or Continuance of Line Home intravenous therapies (PICC only) 01/27/2019  9:34 AM  Exposed Catheter (cm) 1 cm 01/27/2019  9:34 AM  Site Assessment Clean;Dry;Intact 01/27/2019  9:34 AM  Line Status Flushed;Blood return noted;Saline locked 01/27/2019  9:34 AM  Dressing Type Transparent 01/27/2019  9:34 AM  Dressing Status Clean;Dry;Intact;Antimicrobial disc in place 01/27/2019  9:34 AM  Dressing Change Due 02/03/19 01/27/2019  9:34 AM       [address] 01/27/2019, 9:37 AM

## 2019-01-27 NOTE — Progress Notes (Signed)
Pharmacy Brief Note: Antibiotic Renal Dose Adjustment  Patient currently on ampicillin 2 g IV q6h and ceftriaxone 2 g IV q12h for bacteremia and endocarditis.   SCr improved to 1.5, CrCl > 50 mL/min  Will increase ampicillin to 2 g IV q4h  Lenis Noon, PharmD 01/27/19 8:36 AM

## 2019-01-27 NOTE — Progress Notes (Signed)
Daily Progress Note   Patient Name: Eric Price       Date: 01/27/2019 DOB: 03-04-1951  Age: 68 y.o. MRN#: [id number] Attending Physician: No att. providers found Primary Care Physician: [name], MD Admit Date: 01/23/2019  Reason for Consultation/Follow-up: Establishing goals of care  Subjective: Met with Eric Price today.  Discussed again plan for 6 weeks of IV abx as OP.  Discussed possible reenrollment with hospice once he completes IV abx.  Reviewed care plan and all questions in depth.    Reports pain controlled.  Discussed bowel regimen as OP.   Length of Stay: 4  Physical Exam         General: Alert, awake, in no acute distress.  HEENT: No bruits, no goiter, no JVD Heart: Regular rate and rhythm. No murmur appreciated. Lungs: Good air movement, clear Abdomen: Soft, nontender, nondistended, positive bowel sounds.  Ext: No significant edema Neuro: Grossly intact, nonfocal.   Vital Signs: BP 126/71 (BP Location: Right Leg)   Pulse (!) 103   Temp 98.8 F (37.1 C) (Oral)   Resp 17   Ht _0  (1.727 m)   Wt 96.8 kg   SpO2 97%   BMI 32.45 kg/m  SpO2: SpO2: 97 % O2 Device: O2 Device: Room Air O2 Flow Rate:    Intake/output summary:   Intake/Output Summary (Last 24 hours) at 01/27/2019 2032 Last data filed at 01/27/2019 1415 Gross per 24 hour  Intake 2257 ml  Output 2230 ml  Net 27 ml   LBM: Last BM Date: 01/24/19 Baseline Weight: Weight: 93 kg Most recent weight: Weight: 96.8 kg       Palliative Assessment/Data:    Flowsheet Rows     Most Recent Value  Intake Tab  Referral Department  Hospitalist  Unit at Time of Referral  ICU  Palliative Care Primary Diagnosis  Cancer  Date Notified  01/24/19  Palliative Care Type  New Palliative care   Reason for referral  Clarify Goals of Care  Date of Admission  01/23/19  Date first seen by Palliative Care  01/24/19  # of days Palliative referral response time  0 Day(s)  # of days IP prior to Palliative referral  1  Clinical Assessment  Palliative Performance Scale Score  40%  Psychosocial & Spiritual Assessment  Palliative Care  Outcomes  Patient/Family meeting held?  Yes  Who was at the meeting?  Patient, wife via phone  Palliative Care Outcomes  Clarified goals of care      Patient Active Problem List   Diagnosis Date Noted  . Sepsis ([place]) 01/23/2019  . Suspected Covid-19 Virus Infection 01/23/2019  . HCAP (healthcare-associated pneumonia) 01/23/2019  . CAP (community acquired pneumonia) 11/23/2018  . Dyslipidemia 08/31/2018  . Leukocytosis 08/24/2018  . Tachycardia 08/24/2018  . Tremor of both hands 08/24/2018  . Chronic anemia 08/24/2018  . Diabetic neuropathy ([place]) 08/24/2018  . GERD (gastroesophageal reflux disease) 08/24/2018  . Physical deconditioning 08/24/2018  . AKI (acute kidney injury) ([place]) 08/23/2018  . Abdominal distension 06/09/2018  . Electrolyte imbalance 06/09/2018  . Scrotal pain 06/09/2018  . Scrotal swelling 06/09/2018  . Symptom of wheezing 06/09/2018  . Urinary hesitancy 06/09/2018  . Muscle tension dysphonia 03/22/2018  . CKD (chronic kidney disease) stage 3, GFR 30-59 ml/min (HCC) 03/08/2018  . SIRS (systemic inflammatory response syndrome) ([place]) 03/08/2018  . Acute encephalopathy 03/08/2018  . Acute kidney injury ([place])   . Post-nasal drainage 01/19/2018  . Seasonal allergic rhinitis due to pollen 01/19/2018  . Bacteremia due to Enterococcus 07/08/2017  . Mucositis due to chemotherapy 07/08/2017  . Febrile neutropenia ([place]) 07/04/2017  . Drug-induced constipation 06/24/2017  . Thrush, oral 06/24/2017  . 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 De Quervain's tenosynovitis, left 12/29/2016  . Chronic combined systolic and  diastolic CHF (congestive heart failure) ([place]) 07/17/2016  . Nonsustained ventricular tachycardia ([place]) 07/17/2016  . PAD (peripheral artery disease) ([place]) 07/17/2016  . Eczema 06/04/2016  . SI (sacroiliac) joint dysfunction 01/21/2016  . Hip pain, left 12/23/2015  . History of hypercoagulable state 12/23/2015  . Degenerative arthritis of right knee 10/28/2015  . Other specified personal risk factors, not elsewhere classified 08/27/2015  . ICD (implantable cardioverter-defibrillator) in place 08/27/2015  . Phantom limb pain ([place]) 05/24/2015  . Memory loss   . Status post below knee amputation of left lower extremity ([place]) 03/08/2015  . S/P BKA (below knee amputation), left ([place]) 03/08/2015  . Acquired absence of left leg below knee ([place]) 03/08/2015  . Ischemic toe 03/05/2015  . Pain in joint, ankle and foot 02/28/2015  . Cold sensation of skin-Left foot 02/28/2015  . Other disturbances of skin sensation 02/28/2015  . Nonischemic cardiomyopathy ([place]) 02/22/2015  . Coronary artery disease involving native coronary artery of native heart without angina pectoris 02/22/2015  . Embolic disease of toe ([place]) 02/22/2015  . Ischemic ulcer of toe of left foot ([place]) 02/22/2015  . 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 Diabetic foot ulcer ([place]) 02/21/2015  . Critical lower limb ischemia 02/19/2015  . Controlled type 2 diabetes mellitus with stage 3 chronic kidney disease, with long-term current use of insulin ([place]) 09/19/2014  . Hereditary and idiopathic peripheral neuropathy 09/07/2014  . 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 Primary localized osteoarthrosis, lower leg 06/19/2014  . Low back pain 05/28/2014  . Acromioclavicular joint arthritis 05/28/2014  . Dental decay 05/25/2014  . Major depressive disorder, recurrent episode ([place]) 12/11/2013  . Long-term use of immunosuppressant medication 12/04/2013  . Obesity (BMI 30-39.9) 10/31/2013  . Chest pain 10/20/2013   . Stable angina ([place]) 10/19/2013  . Type 2 diabetes mellitus with hyperglycemia ([place]) 10/19/2013  . BPH (benign prostatic hyperplasia) 10/11/2013  . Enlarged prostate without lower urinary tract symptoms ([place]) 10/11/2013  . Dyspnea 06/30/2013  . Depression 01/19/2013  .  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 History of depression 07/13/2012  . HTN (hypertension) 07/13/2012  . HLD (hyperlipidemia) 07/13/2012  . Personal history of other mental and behavioral disorders 07/13/2012  . Type 2 diabetes mellitus with diabetic polyneuropathy ([place]) 07/13/2012    Palliative Care Assessment & Plan   Patient Profile: 68 y.o. 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  Noted prior episode bacteremia in June 2019.  Palliative consulted for [place].   Recommendations/Plan:   - Plan for 6 week course of IV abx as outlined by Dr. Linus Salmons.  Has been set up with home health on discharge.   - Plan to forgo TEE and possible ICD removal. - Discussed that he can always reenroll if appropriate at time of completion of IV abx.     Code Status:    Code Status Orders  (From admission, onward)         Start     Ordered   01/24/19 0014  Do not attempt resuscitation (DNR)  Continuous    Question Answer Comment  In the event of cardiac or respiratory ARREST Do not call a "code blue"   In the event of cardiac or respiratory ARREST Do not perform Intubation, CPR, defibrillation or ACLS   In the event of cardiac or respiratory ARREST Use medication by any route, position, wound care, and other measures to relive pain and suffering. May use oxygen, suction and manual treatment of airway obstruction as needed for comfort.      01/24/19 0013        Code Status History    Date Active Date Inactive Code Status Order ID  Comments User Context   11/23/2018 1749 11/27/2018 1832 Full Code [id number]  Kayleen Memos, DO ED   08/23/2018 2109 08/26/2018 1501 Full Code [id number]  Shela Leff, MD Inpatient   03/08/2018 0339 03/14/2018 2040 Full Code [id number]  Vianne Bulls, MD ED   08/27/2015 2008 08/28/2015 1653 Full Code [id number]  [name], MD Inpatient   03/08/2015 1745 03/16/2015 1214 Full Code [id number]  Cathlyn Parsons, PA-C Inpatient   03/05/2015 1258 03/08/2015 1745 Full Code [id number]  Gabriel Earing, PA-C Inpatient   02/21/2015 1152 02/22/2015 1612 Full Code [id number]  [name], MD Inpatient   02/21/2015 0348 02/21/2015 1152 Full Code [id number]  Lily Kocher, MD Inpatient   10/19/2013 2318 10/20/2013 2034 Full Code [id number]  Orson Eva, MD Inpatient       Prognosis:   < 6 months  Discharge Planning:  Home with home health  Care plan was discussed with patient, wife  Thank you for allowing the Palliative Medicine Team to assist in the care of this patient.   Total Time 40 Prolonged Time Billed No      Greater than 50%  of this time was spent counseling and coordinating care related to the above assessment and plan.  Micheline Rough, MD  Please contact Palliative Medicine Team phone at [phone] for questions and concerns.

## 2019-01-27 NOTE — Discharge Summary (Signed)
Physician Discharge Summary  Eric Price ZOX:[id number] DOB: Dec 16, 1950 DOA: 01/23/2019  PCP: Eric Post, MD  Admit date: 01/23/2019 Discharge date: 01/27/2019  Admitted From: Home Disposition: Home with Home Health  Recommendations for Outpatient Follow-up:  1. Follow up with PCP in 1-2 weeks 2. Follow up with Infectious Diseases Dr. Linus Price in 4-6 Weeks and continue IV Daptomycin 3. Follow up with Palliative Care in the outpatient setting  4. Please obtain CMP/CBC, Mag, Phos in one week 5. Please follow up on the following pending results:  Home Health: Yes Equipment/Devices: [place] patient suffers from [place] dysfunction from his left BKA which impairs his ability to performed daily activities like ambulating in the home.  A cane or rolling walker will not resolve the issue with performing activities of daily living.  Wheelchair will allow the patient to safely perform daily activities.  Patient can safely propel the wheelchair in the home or has a caregiver who can provide assistance.  Accessories needed will be elevating leg rests, Wheelock's, extensions, and anti-tippers.  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  A hospital bed will alleviate pain by allowing the patient's head and trunk to be positioned in ways not feasible with a normal bed.  Pain episodes frequently require frequent and immediate changes in body position which cannot be achieved with a normal bed.  Discharge Condition: Stable CODE STATUS: DO NOT RESUSCITATE Diet recommendation: Heart Healthy Carb Modified Diet    Brief/Interim Summary: HPI Per Dr. 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 It was a sudden onset of illness. Wife states it was like someone took a plug out. He has been incontinent only started today, he reports it has been burning a bit with urination. Family reports mild dry cough, no chest pain. No abdominal pain, no diarrhea, reports leg aches, rigors   PatientHas hospice at home who has been seen him in the regular basis. Prior to today he was eating and drinking well but today he took nosedive and declined. No shortness of breath no nausea vomiting no diarrhea He lost his sense of smell since chemo  Has recently been admitted at [facility] for hypercalcemia and confusion Have had significant weakness generalized at that time. CT brain within normal limits calcium was found to be 14.2 decreased PTH creatinine was elevated 1.6 time chest x-ray showed mild left more than right heterogeneous opacities thought to be may be edema he was given zoledronic acid and calcitoninZolendronic acid 4 mg IV x1 on 4/8 and Calcitonin subQ q12 for 48 hours (4/8, 4/9) & (4/13, 4/14) which decreased his Ca from 14.2 till 9.6. During admissionpt received multiple doses of lasix (IV 80 mg) throughout admission for volume overload.  Given that his mycosis fungoides have been progressing oncology felt that the had no more options for him to offer his case was discussed with patient and his family and they agreed to hospice carepatient was discharged home with home hospice.  They have come to see  him a few times.  **Interim History Sepsis physiology has improved and he states he is feeling better. Antibiotics were changed to IV ampicillin and Ceftriaxone while hospitalized.  Palliative care for further goals of discussion care. Patient leaning toward being more aggressive and ID is concerned of a deep infection and at this time recommends treating for prolonged 6 weeks course of IV antibiotics.  Dr. Linus Price does not feel that the TEE is absolutely indicated and he does not feel that he is a good candidate to have his ICD removed and just recommends treatment for endocarditis for 6 weeks of IV antibiotics and then using oral suppression with amoxicillin 500 mg p.o. 3 times daily indefinitely after that.  Currently ID recommends treatment with Ampicillin and IV ceftriaxone was switched to IV daptomycin prior to discharge and he will be discharged on IV daptomycin for 6 weeks with end date of 03/07/2019.  Patient will then need oral suppression with amoxicillin 500 mg p.o. 3 times daily after that and will need to follow-up with infectious disease in 4 to 6 weeks.  Patient was deemed medically stable after PICC line was placed and he will need to follow-up with PCP, infectious diseases and palliative care in the outpatient setting.  Discharge Diagnoses:  Active Problems:   HTN (hypertension)   HLD (hyperlipidemia)   Mycosis fungoides (HCC)   Controlled type 2 diabetes mellitus with stage 3 chronic kidney disease, with long-term current use of insulin (HCC)   Nonischemic cardiomyopathy (HCC)   Coronary artery disease involving native coronary artery of native heart without angina pectoris   S/P BKA (below knee amputation), left (HCC)   ICD (implantable cardioverter-defibrillator) in place   Chronic combined systolic and diastolic CHF (congestive heart failure) (HCC)   CKD (chronic kidney disease) stage 3, GFR 30-59 ml/min (HCC)   SIRS (systemic inflammatory response syndrome) (HCC)   Scrotal  swelling   Type 2 diabetes mellitus with diabetic polyneuropathy (HCC)   Type 2 diabetes mellitus with hyperglycemia ([place])   CAP (community acquired pneumonia)   Sepsis ([place])   Suspected Covid-19 Virus Infection   HCAP (healthcare-associated pneumonia)  Sepsis secondary to Enterococcus Faecalis Bacteremia, improving  -On admission, patient was tachycardic with tachypnea and fever -SIRS physiology has much improved -Blood culturesGPC, Enterococcus species and grew out Enterococcus Faecalis with susceptibilities to follow  -Chest x-ray showed "Interval improvement in bilateral airspace disease. Mild residual left lower lobe airspace disease. Most likely clearing pneumonia." -COVID-19 negative -Respiratory viral panel Negative  -Urine culture grew out Enterococcus Faecalis (>100,000 CFU) and 80,000 CFU of Klebsiella Pneumoniae, although UA unremarkable for infection and patient not complaining of Urinary Symptoms so likely a pyuria/asymptomatic bacteriuria  -Continue IV antibiotics but was transitioned to IV Ampicillin and now started on IV Ceftriaxone.Of note patient did have enterococcus back in June 2019 -Infectious disease made aware of patient's admission. Do not feel that patient warrants work-up for endocarditis at this time but that depends on how aggressive we will need to be pending Palliative Discussion as patient was recommended for Hospice. ID recommending that if he requires aggressive care he would need a TEE and AICD removal due to his persistent bacteremia along with prolonged IV antibiotics with dual  therapy per ID recommendations but now feel as if TEE is not absolutely indicated and feel that he may not be a candidate for his AICD to be removed so they are emprically just going to treat for Endocarditis with Dual Therapy of Ampicillin and Ceftriaxone; Dr. Linus Price changed to IV Daptomycin as it is not cost-prohibitive; Currently avoiding Vancomcyin due to Renal Fxn -ID  recommending oral suppression with Amoxicillin 500 mg po TID after 6 weeks of IV Abx -Obtained repeat Blood Cx and they show NGTD at 3 Days  -Palliative Care Following for [place] Discussion and likely Hospice will be revoked given his desire for aggressive treatment  -Follow-up with infectious diseases Dr., within 4 to 6 weeks as well as primary care physician -We will continue IV antibiotics for 6 weeks and then p.o. suppression thereafter  Mycosis Fungoides -Patient was followed by Oncology at [facility] however is no longer candidate for chemotherapy or further management. His last admission he was sent home with Hospice -Patient currently DNR DNI -Palliative Care Medicine consulted and appreciate further evaluation and recommendations and Dr. Domingo Cocking in discussion with patient and wife. If patient is to pursue aggressive treatment (Which he feels inclined to do) likley his Hospice will be revoked  -Patient with Abdominal skin breakdown, wound care consulted -Will need Dressing changes at home -Will need to follow up with Oncology now that he is likely to revoke Hospice   Coronary Artery Disease -Stable and currently chest pain-free -Currently his Carevdilol 37.5 mg po BID at home is actually 25 mg po BID and this has been resumed  -Resumed Atorvastatin 20 mg po Daily  -Follow up with Cardiology Dr. Sallyanne Kuster at D/C   Chronic Combined Systolic and Diastolic heart failure -Patient does have right lower extremity edema although does not appear to be volume overloaded; IVF with NS at 125 mL/hr now been D/C'd -Lasix was initially held given sepsis and will continue to Hold today and resume at D/C with prior administration parameters  -Resumed Home Coreg yesterday  -Strict I's/O's, Daily Weights -Patient is -1,684 mL since Admission and patient is +8 lbs since Admission and not repeated in the last 2 days  -Continue to Monitor for S/Sx of Volume Overload  -Follow up with Dr. Sallyanne Kuster at D/C    Acute Kidney Injury on Chronic Kidney Disease, stage III -Likely secondary to sepsis but is now improving -Patient's BUN/Cr went from 30/2.49 -> 29/2.35 -> 32/1.89 -> 28/1.60 -> 25/1.55 -Was on IVF with NS at 125 mL/hr and now stopped -Continue to Monitor and Trend Renal Fxn -Repeat CMP within 1 week   Diabetes Mellitus, Type II -CBG's ranging from 102-199 -HbA1c was 6.4 -Continue with Insulin Detemir 5 units sq qHS and Sensitive Novolog SSI q4h -Continue to Monitor and adjust Insulin as Necessary and resume home Regimen    Hypomagnesemia -Was 1.2 on Admission and is now 1.6 -Replete prior to D/C  -Continue to Monitor and Replete as Necessary -Repeat Mag Level in AM   Chronic Normocytic Anemia/Anemia of Chronic Kidney Disease -Patient's Hb/Hct went from 10.8/33.2 -> 9.7/29.6 -> 9.4/29.1 -> 8.5/26.6 -> 8.8/27.3 -> 9.0/29.0 -In the setting of IVF Resuscitation  -Check Anemia Panel as an outpatient  -Continue to Monitor for S/Sx of Bleeding -Repeat CBC within 1 week   Hyperlipidemia -Continue Atorvastatin 20 mg po Daily   Essential Hypertension -Carvedilol was resumed  -Continue to monitor closely; BP this afternoon was 126/71  Goals of Care -As above, patient was discharged from [facility] with Hospice but likely this is to be revoked pending his aggressive treatment; Patient wishes to go home with 6 weeks of IV Abx -Currently DNR/DNI -Palliative care consulted and appreciate further evaluation and recommendations and appreciated Dr. Kirstie Mirza help  Obesity -Estimated body mass index is 32.45 kg/m as calculated from the following:   Height as of this encounter: 5' 8"  (1.727 m).   Weight as of this encounter: 96.8 kg. -Weight Loss and Dietary Counseling given   Discharge Instructions  Discharge Instructions    (HEART FAILURE PATIENTS) Call MD:  Anytime you have any of the following symptoms: 1) 3 pound weight gain in 24 hours or 5 pounds in 1  week 2) shortness of breath, with or without a dry hacking cough 3) swelling in the hands, feet or stomach 4) if you have to sleep on extra pillows at night in order to breathe.   Complete by:  As directed    Call MD for:  difficulty breathing, headache or visual disturbances   Complete by:  As directed    Call MD for:  extreme fatigue   Complete by:  As directed    Call MD for:  hives   Complete by:  As directed    Call MD for:  persistant dizziness or light-headedness   Complete by:  As directed    Call MD for:  persistant nausea and vomiting   Complete by:  As directed    Call MD for:  redness, tenderness, or signs of infection (pain, swelling, redness, odor or green/yellow discharge around incision site)   Complete by:  As directed    Call MD for:  severe uncontrolled pain   Complete by:  As directed    Call MD for:  temperature >100.4   Complete by:  As directed    DME Hospital bed   Complete by:  As directed    Patient has (list medical condition):  Mycosis Fungoidies; Left BKA   The above medical condition requires:  Patient requires the ability to reposition frequently   Bed type:  Semi-electric   Support Surface:  Gel Overlay   Diet - low sodium heart healthy   Complete by:  As directed    1500 mL Fluid Restriction   Diet Carb Modified   Complete by:  As directed    Discharge instructions   Complete by:  As directed    You were cared for by a hospitalist during your hospital stay. If you have any questions about your discharge medications or the care you received while you were in the hospital after you are discharged, you can call the unit and ask to speak with the hospitalist on call if the hospitalist that took care of you is not available. Once you are discharged, your primary care physician will handle any further medical issues. Please note that NO REFILLS for any discharge medications will be authorized once you are discharged, as it is imperative that you return to  your primary care physician (or establish a relationship with a primary care physician if you do not have one) for your aftercare needs so that they can reassess your need for medications and monitor your lab values.  Follow up with PCP, ID, Palliative Care, and Cardiology. Take all medications as prescribed. If symptoms change or worsen please return to the ED for evaluation   For home use only DME standard manual wheelchair with seat cushion   Complete by:  As directed  Patient suffers from ambulatory dysfunction from Left BKAA which impairs their ability to perform daily activities like ambulating in the home.  A Cane or a Rolling [place] Rile will not resolve  issue with performing activities of daily living. A wheelchair will allow patient to safely perform daily activities. Patient can safely propel the wheelchair in the home or has a caregiver who can provide assistance.  Accessories: elevating leg rests (ELRs), wheel locks, extensions and anti-tippers.   Home infusion instructions [facility] May follow [place] Dosing Protocol; May administer Cathflo as needed to maintain patency of vascular access device.; Flushing of vascular access device: per [facility] Protocol: 0.9% NaCl pre/Price medica...   Complete by:  As directed    Instructions:  May follow [place] Dosing Protocol   Instructions:  May administer Cathflo as needed to maintain patency of vascular access device.   Instructions:  Flushing of vascular access device: per [facility] Protocol: 0.9% NaCl pre/Price medication administration and prn patency; Heparin 100 u/ml, 75m for implanted ports and Heparin 10u/ml, 569mfor all other central venous catheters.   Instructions:  May follow [facility] Anaphylaxis Protocol for First Dose Administration in the home: 0.9% NaCl at 25-50 ml/hr to maintain IV access for protocol meds. Epinephrine 0.3 ml IV/IM PRN and Benadryl 25-50 IV/IM PRN s/s of anaphylaxis.   Instructions:  Ad[place]confusion  Coordinator (RN) to assist per patient IV care needs in the home PRN.   Increase activity slowly   Complete by:  As directed      Allergies as of 01/27/2019      Reactions   Morphine Shortness Of Breath, Anaphylaxis   Morphine And Related    "took my breath away"   Oxycodone    Pt stated, "It makes me climbs walls; fight wars"   Robaxin [methocarbamol] Other (See Comments)      Medication List    STOP taking these medications   DocQLace 100 MG capsule Generic drug:  docusate sodium   HYDROmorphone HCl 1 MG/ML Liqd Commonly known as:  DILAUDID   hyoscyamine 0.125 MG SL tablet Commonly known as:  LEVSIN SL   LORazepam 0.5 MG tablet Commonly known as:  ATIVAN   potassium chloride SA 20 MEQ tablet Commonly known as:  Klor-Con M20   tiZANidine 2 MG tablet Commonly known as:  ZANAFLEX     TAKE these medications   Accu-Chek Aviva device Use to check blood sugar 6 times daily   acetaminophen 500 MG tablet Commonly known as:  TYLENOL Take 500-1,000 mg by mouth every 8 (eight) hours as needed for mild pain.   albuterol (2.5 MG/3ML) 0.083% nebulizer solution Commonly known as:  PROVENTIL Take 3 mLs (2.5 mg total) by nebulization 2 (two) times daily as needed for wheezing or shortness of breath.   atorvastatin 20 MG tablet Commonly known as:  LIPITOR TAKE 1 TABLET BY MOUTH EVERY DAY AT 6 What changed:    how much to take  how to take this  when to take this  additional instructions   B-D UF III MINI PEN NEEDLES 31G X 5 MM Misc Generic drug:  Insulin Pen Needle   benzonatate 200 MG capsule Commonly known as:  TESSALON TAKE 1 CAPSULE(200 MG) BY MOUTH THREE TIMES DAILY AS NEEDED FOR COUGH   carvedilol 25 MG tablet Commonly known as:  COREG Take 1.5 tablets (37.5 mg total) by mouth 2 (two) times daily with a meal. What changed:  how much to take  clobetasol cream 0.05 % Commonly known as:  TEMOVATE Apply 1 application topically 2 (two) times daily.    daptomycin  IVPB Commonly known as:  CUBICIN Inject 800 mg into the vein daily. Indication:  Enterococcus bacteremia Last Day of Therapy:  02/04/2019 Labs - Once weekly:  CBC/D, BMP, and CPK Labs - Every other week:  ESR and CRP Start taking on:  January 28, 2019   Dulaglutide 1.5 MG/0.5ML Sopn Commonly known as:  Trulicity Inject 1.5 mg into the skin every Tuesday.   ENSURE COMPLETE PO Take 237 mLs by mouth daily.   furosemide 80 MG tablet Commonly known as:  LASIX Take no Lasix if weight is under 200; take 1 tablet daily if weight over 200 and bid if weight is over 205   glucagon 1 MG injection Inject 1 mg into the vein once as needed (for high blood sugar). What changed:  reasons to take this   glucose blood test strip Commonly known as:  ACCU-CHEK ACTIVE STRIPS Use to check blood sugar 6 times a day   insulin aspart 100 UNIT/ML FlexPen Commonly known as:  NovoLOG FlexPen INJECT 20-25 UNITS BEFORE BREAKFAST, LUNCH , DINNER   Levemir FlexTouch 100 UNIT/ML Pen Generic drug:  Insulin Detemir Inject 10 Units into the skin at bedtime. What changed:  Another medication with the same name was removed. Continue taking this medication, and follow the directions you see here.   loratadine 10 MG tablet Commonly known as:  CLARITIN Take 10 mg by mouth daily.   Melatonin 3 MG Tabs Take 1 tablet by mouth at bedtime.   nystatin 100000 UNIT/ML suspension Commonly known as:  MYCOSTATIN Take 5 mLs by mouth 3 (three) times daily.   ondansetron 8 MG tablet Commonly known as:  ZOFRAN Take 8 mg by mouth every 8 (eight) hours as needed for nausea or vomiting.   ONE TOUCH LANCETS Misc Check 2 times daily.   pantoprazole 40 MG tablet Commonly known as:  PROTONIX TAKE 1 TABLET(40 MG) BY MOUTH DAILY What changed:  See the new instructions.   prednisoLONE acetate 1 % ophthalmic suspension Commonly known as:  PRED FORTE Place 1 drop into both eyes daily.   prochlorperazine 10 MG  tablet Commonly known as:  COMPAZINE Take 10 mg by mouth 3 (three) times daily.   rivaroxaban 20 MG Tabs tablet Commonly known as:  XARELTO Take 1 tablet (20 mg total) by mouth daily with supper.   sennosides-docusate sodium 8.6-50 MG tablet Commonly known as:  SENOKOT-S Take 1 tablet by mouth daily.   traMADol 50 MG tablet Commonly known as:  ULTRAM Take 1 tablet (50 mg total) by mouth every 12 (twelve) hours as needed for moderate pain.   traZODone 50 MG tablet Commonly known as:  DESYREL Take 1 tablet by mouth at bedtime.            Home Infusion Instuctions  (From admission, onward)         Start     Ordered   01/27/19 0000  Home infusion instructions [facility] May follow [place] Dosing Protocol; May administer Cathflo as needed to maintain patency of vascular access device.; Flushing of vascular access device: per [facility] Protocol: 0.9% NaCl pre/Price medica...    Question Answer Comment  Instructions May follow [place] Dosing Protocol   Instructions May administer Cathflo as needed to maintain patency of vascular access device.   Instructions Flushing of vascular access device: per [facility] Protocol: 0.9% NaCl  pre/Price medication administration and prn patency; Heparin 100 u/ml, 20m for implanted ports and Heparin 10u/ml, 559mfor all other central venous catheters.   Instructions May follow [facility] Anaphylaxis Protocol for First Dose Administration in the home: 0.9% NaCl at 25-50 ml/hr to maintain IV access for protocol meds. Epinephrine 0.3 ml IV/IM PRN and Benadryl 25-50 IV/IM PRN s/s of anaphylaxis.   Instructions [facility] Infusion Coordinator (RN) to assist per patient IV care needs in the home PRN.      01/27/19 1237           Durable Medical Equipment  (From admission, onward)         Start     Ordered   01/27/19 0000  DME Hospital bed    Question Answer Comment  Patient has (list medical condition): Mycosis Fungoidies; Left BKA   The  above medical condition requires: Patient requires the ability to reposition frequently   Bed type Semi-electric   Support Surface: Gel Overlay      01/27/19 1150   01/27/19 0000  For home use only DME standard manual wheelchair with seat cushion    Comments:  Patient suffers from ambulatory dysfunction from Left BKAA which impairs their ability to perform daily activities like ambulating in the home.  A Cane or a Rolling [place] Rileill not resolve  issue with performing activities of daily living. A wheelchair will allow patient to safely perform daily activities. Patient can safely propel the wheelchair in the home or has a caregiver who can provide assistance.  Accessories: elevating leg rests (ELRs), wheel locks, extensions and anti-tippers.   01/27/19 1150         Follow-up Information    [place], BrAlinda SierrasMD. Call.   Specialty:  Family Medicine Why:  Follow up within 1 week Contact information: [id number] [phone]      CrSanda KleinMD .   Specialty:  Cardiology Contact information: [id number] [place] [phone]        Allergies  Allergen Reactions  . Morphine Shortness Of Breath and Anaphylaxis  . Morphine And Related     "took my breath away"  . Oxycodone     Pt stated, "It makes me climbs walls; fight wars"  . Robaxin [Methocarbamol] Other (See Comments)   Consultations:  Infectious diseases  Palliative care medicine  Procedures/Studies: Dg Chest Port 1 View  Result Date: 01/23/2019 CLINICAL DATA:  Fever EXAM: PORTABLE CHEST 1 VIEW COMPARISON:  11/23/2018 FINDINGS: Significant improvement in infiltrate on the left with mild residual airspace disease on the left. Right lung is clear. Negative for edema or effusion AICD in the right ventricle unchanged. Heart size within normal limits. IMPRESSION: Interval improvement in bilateral airspace disease. Mild residual left lower lobe  airspace disease. Most likely clearing pneumonia. Electronically Signed   By: ChFranchot Gallo.D.   On: 01/23/2019 19:25   UsKoreakg Site Rite  Result Date: 01/26/2019 If Site Rite image not attached, placement could not be confirmed due to current cardiac rhythm.   Subjective: Seen and examined at bedside. Patient was feeling better.  Denies chest pain, lightheadedness or dizziness.  No nausea or vomiting.  Denies any other concerns or complaints at this time and is ready to go home.  Still wanting to pursue aggressive therapy and understands he will undergo 6 weeks of IV antibiotics with daptomycin and then p.o. suppression with amoxicillin.  Care  was discussed with the patient is understandable and agreeable to plan and will be going home with home health and will need to follow-up with PCP and infectious diseases along with palliative care medicine in the outpatient setting.  He will need to see his oncologist back as well.  Discharge Exam: Vitals:   01/27/19 1100 01/27/19 1427  BP:  126/71  Pulse:  (!) 103  Resp:  17  Temp:  98.8 F (37.1 C)  SpO2: 97% 97%   Vitals:   01/26/19 2049 01/27/19 0413 01/27/19 1100 01/27/19 1427  BP: 119/78 127/77  126/71  Pulse: 94 94  (!) 103  Resp: 20 18  17   Temp: 98.3 F (36.8 C) 98 F (36.7 C)  98.8 F (37.1 C)  TempSrc:    Oral  SpO2: 99% 96% 97% 97%  Weight:      Height:       General: Pt is alert, awake, not in acute distress Cardiovascular: RRR, S1/S2 +, no rubs, no gallops Respiratory: Diminished bilaterally, no wheezing, no rhonchi Abdominal: Soft, Mildly tender, Distended, bowel sounds +; 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    Microbiology: Recent Results (from the past 240 hour(s))  Blood Culture (routine x 2)     Status: Abnormal   Collection Time:  01/23/19  6:27 PM  Result Value Ref Range Status   Specimen Description   Final    RIGHT ANTECUBITAL Performed at [facility] [address], [place], [place] [address]    Special Requests   Final    BOTTLES DRAWN AEROBIC AND ANAEROBIC Blood Culture adequate volume Performed at [facility] [address] [place], [place] [address]    Culture  Setup Time   Final    GRAM POSITIVE COCCI IN PAIRS IN BOTH AEROBIC AND ANAEROBIC BOTTLES CRITICAL RESULT CALLED TO, READ BACK BY AND VERIFIED WITH: Karel Jarvis PharmD 11:05 01/24/19 ([email or web address]) Performed at [facility], [place] [address], [place], [place] [address]    Culture ENTEROCOCCUS FAECALIS (A)  Final   Report Status 01/26/2019 FINAL  Final   Organism ID, Bacteria ENTEROCOCCUS FAECALIS  Final      Susceptibility   Enterococcus faecalis - MIC*    AMPICILLIN <=2 SENSITIVE Sensitive     VANCOMYCIN 1 SENSITIVE Sensitive     GENTAMICIN SYNERGY RESISTANT Resistant     * ENTEROCOCCUS FAECALIS  Blood Culture ID Panel (Reflexed)     Status: Abnormal   Collection Time: 01/23/19  6:27 PM  Result Value Ref Range Status   Enterococcus species DETECTED (A) NOT DETECTED Final    Comment: CRITICAL RESULT CALLED TO, READ BACK BY AND VERIFIED WITH: Karel Jarvis PharmD 11:05 01/24/19 ([email or web address])    Vancomycin resistance NOT DETECTED NOT DETECTED Final   Listeria monocytogenes NOT DETECTED NOT DETECTED Final   Staphylococcus species NOT DETECTED NOT DETECTED Final   Staphylococcus aureus (BCID) NOT DETECTED NOT DETECTED Final   Streptococcus species NOT DETECTED NOT DETECTED Final   Streptococcus agalactiae NOT DETECTED NOT DETECTED Final   Streptococcus pneumoniae NOT DETECTED NOT DETECTED Final   Streptococcus pyogenes NOT DETECTED NOT DETECTED Final   Acinetobacter baumannii NOT DETECTED NOT DETECTED Final   Enterobacteriaceae species NOT DETECTED NOT DETECTED Final   Enterobacter cloacae complex NOT DETECTED NOT  DETECTED Final   Escherichia coli NOT DETECTED NOT DETECTED Final  Klebsiella oxytoca NOT DETECTED NOT DETECTED Final   Klebsiella pneumoniae NOT DETECTED NOT DETECTED Final   Proteus species NOT DETECTED NOT DETECTED Final   Serratia marcescens NOT DETECTED NOT DETECTED Final   Haemophilus influenzae NOT DETECTED NOT DETECTED Final   Neisseria meningitidis NOT DETECTED NOT DETECTED Final   Pseudomonas aeruginosa NOT DETECTED NOT DETECTED Final   Candida albicans NOT DETECTED NOT DETECTED Final   Candida glabrata NOT DETECTED NOT DETECTED Final   Candida krusei NOT DETECTED NOT DETECTED Final   Candida parapsilosis NOT DETECTED NOT DETECTED Final   Candida tropicalis NOT DETECTED NOT DETECTED Final    Comment: Performed at [facility], [place] [address] [place], [place] [address]  Blood Culture (routine x 2)     Status: Abnormal   Collection Time: 01/23/19  6:32 PM  Result Value Ref Range Status   Specimen Description   Final    PORTA CATH Performed at [facility] [address] [place], [place] [address]    Special Requests   Final    BOTTLES DRAWN AEROBIC AND ANAEROBIC Blood Culture adequate volume Performed at [facility] [address], [place], [place] [address]    Culture  Setup Time   Final    GRAM POSITIVE COCCI IN BOTH AEROBIC AND ANAEROBIC BOTTLES CRITICAL VALUE NOTED.  VALUE IS CONSISTENT WITH PREVIOUSLY REPORTED AND CALLED VALUE.    Culture (A)  Final    ENTEROCOCCUS FAECALIS SUSCEPTIBILITIES PERFORMED ON PREVIOUS CULTURE WITHIN THE LAST 5 DAYS. Performed at [facility], [place] [address], [place], [place] [address]    Report Status 01/26/2019 FINAL  Final  Urine culture     Status: Abnormal   Collection Time: 01/23/19  8:06 PM  Result Value Ref Range Status   Specimen Description   Final    URINE, CATHETERIZED Performed at [facility] [address], [place], [place] [address]    Special  Requests   Final    Immunocompromised Performed at [facility], [place] [address] [place],  [address]    Culture (A)  Final    >=100,000 COLONIES/mL ENTEROCOCCUS FAECALIS 80,000 COLONIES/mL KLEBSIELLA PNEUMONIAE    Report Status 01/25/2019 FINAL  Final   Organism ID, Bacteria KLEBSIELLA PNEUMONIAE (A)  Final   Organism ID, Bacteria ENTEROCOCCUS FAECALIS (A)  Final      Susceptibility   Enterococcus faecalis - MIC*    AMPICILLIN <=2 SENSITIVE Sensitive     LEVOFLOXACIN 1 SENSITIVE Sensitive     NITROFURANTOIN <=16 SENSITIVE Sensitive     VANCOMYCIN 1 SENSITIVE Sensitive     * >=100,000 COLONIES/mL ENTEROCOCCUS FAECALIS   Klebsiella pneumoniae - MIC*    AMPICILLIN >=32 RESISTANT Resistant     CEFAZOLIN 8 SENSITIVE Sensitive     CEFTRIAXONE <=1 SENSITIVE Sensitive     CIPROFLOXACIN <=0.25 SENSITIVE Sensitive     GENTAMICIN <=1 SENSITIVE Sensitive     IMIPENEM <=0.25 SENSITIVE Sensitive     NITROFURANTOIN 32 SENSITIVE Sensitive     TRIMETH/SULFA <=20 SENSITIVE Sensitive     AMPICILLIN/SULBACTAM 16 INTERMEDIATE Intermediate     PIP/TAZO <=4 SENSITIVE Sensitive     Extended ESBL NEGATIVE Sensitive     * 80,000 COLONIES/mL KLEBSIELLA PNEUMONIAE  SARS Coronavirus 2 [facility] order, Performed in [facility] hospital lab)     Status: None   Collection Time: 01/23/19 10:51 PM  Result Value Ref Range Status   SARS Coronavirus 2 NEGATIVE NEGATIVE Final  Comment: (NOTE) If result is NEGATIVE SARS-CoV-2 target nucleic acids are NOT DETECTED. The SARS-CoV-2 RNA is generally detectable in upper and lower  respiratory specimens during the acute phase of infection. The lowest  concentration of SARS-CoV-2 viral copies this assay can detect is 250  copies / mL. A negative result does not preclude SARS-CoV-2 infection  and should not be used as the sole basis for treatment or other  patient management decisions.  A negative result may occur with  improper specimen  collection / handling, submission of specimen other  than nasopharyngeal swab, presence of viral mutation(s) within the  areas targeted by this assay, and inadequate number of viral copies  (<250 copies / mL). A negative result must be combined with clinical  observations, patient history, and epidemiological information. If result is POSITIVE SARS-CoV-2 target nucleic acids are DETECTED. The SARS-CoV-2 RNA is generally detectable in upper and lower  respiratory specimens dur ing the acute phase of infection.  Positive  results are indicative of active infection with SARS-CoV-2.  Clinical  correlation with patient history and other diagnostic information is  necessary to determine patient infection status.  Positive results do  not rule out bacterial infection or co-infection with other viruses. If result is PRESUMPTIVE POSTIVE SARS-CoV-2 nucleic acids MAY BE PRESENT.   A presumptive positive result was obtained on the submitted specimen  and confirmed on repeat testing.  While 2019 novel coronavirus  (SARS-CoV-2) nucleic acids may be present in the submitted sample  additional confirmatory testing may be necessary for epidemiological  and / or clinical management purposes  to differentiate between  SARS-CoV-2 and other Sarbecovirus currently known to infect humans.  If clinically indicated additional testing with an alternate test  methodology [phone]) is advised. The SARS-CoV-2 RNA is generally  detectable in upper and lower respiratory sp ecimens during the acute  phase of infection. The expected result is Negative. Fact Sheet for Patients:  StrictlyIdeas.no Fact Sheet for Healthcare Providers: BankingDealers.co.za This test is not yet approved or cleared by the [place] FDA and has been authorized for detection and/or diagnosis of SARS-CoV-2 by FDA under an Emergency Use Authorization (EUA).  This EUA will remain in effect  (meaning this test can be used) for the duration of the COVID-19 declaration under Section 564(b)(1) of the Act, 21 U.S.C. section 360bbb-3(b)(1), unless the authorization is terminated or revoked sooner. Performed at [facility], [place] [address], [place], [place] [address]   Respiratory Panel by PCR     Status: None   Collection Time: 01/24/19 12:13 AM  Result Value Ref Range Status   Adenovirus NOT DETECTED NOT DETECTED Final   Coronavirus 229E NOT DETECTED NOT DETECTED Final    Comment: (NOTE) The Coronavirus on the Respiratory Panel, DOES NOT test for the novel  Coronavirus (2019 nCoV)    Coronavirus HKU1 NOT DETECTED NOT DETECTED Final   Coronavirus NL63 NOT DETECTED NOT DETECTED Final   Coronavirus OC43 NOT DETECTED NOT DETECTED Final   Metapneumovirus NOT DETECTED NOT DETECTED Final   Rhinovirus / Enterovirus NOT DETECTED NOT DETECTED Final   Influenza A NOT DETECTED NOT DETECTED Final   Influenza B NOT DETECTED NOT DETECTED Final   Parainfluenza Virus 1 NOT DETECTED NOT DETECTED Final   Parainfluenza Virus 2 NOT DETECTED NOT DETECTED Final   Parainfluenza Virus 3 NOT DETECTED NOT DETECTED Final   Parainfluenza Virus 4 NOT DETECTED NOT DETECTED Final   Respiratory Syncytial Virus NOT DETECTED NOT DETECTED Final  Bordetella pertussis NOT DETECTED NOT DETECTED Final   Chlamydophila pneumoniae NOT DETECTED NOT DETECTED Final   Mycoplasma pneumoniae NOT DETECTED NOT DETECTED Final    Comment: Performed at [facility], [place] [address] [place], [place] [address]  Culture, blood (routine x 2)     Status: None (Preliminary result)   Collection Time: 01/24/19  1:47 PM  Result Value Ref Range Status   Specimen Description   Final    BLOOD RIGHT HAND Performed at [facility] [address] [place], [place] [address]    Special Requests   Final    BOTTLES DRAWN AEROBIC AND ANAEROBIC Blood Culture adequate volume Performed at [facility] [address], [place], [place] [address]    Culture   Final    NO GROWTH 3 DAYS Performed at [facility], [place] [address] [place], [place] [address]    Report Status PENDING  Incomplete  MRSA PCR Screening     Status: None   Collection Time: 01/24/19  6:30 PM  Result Value Ref Range Status   MRSA by PCR NEGATIVE NEGATIVE Final    Comment:        The GeneXpert MRSA Assay (FDA approved for NASAL specimens only), is one component of a comprehensive MRSA colonization surveillance program. It is not intended to diagnose MRSA infection nor to guide or monitor treatment for MRSA infections. Performed at [facility], [place] [address], [place], [place] [address]      Labs: BNP (last 3 results) Recent Labs    11/17/18 1249 11/23/18 1318 01/23/19 2141  BNP 17.7 24.4 26.3   Basic Metabolic Panel: Recent Labs  Lab 01/23/19 1827 01/24/19 0013 01/24/19 0150 01/24/19 0321 01/25/19 0616 01/26/19 0511 01/27/19 0412  NA 133*  --   --  133* 137 138 138  K 4.3  --   --  3.8 4.2 3.9 4.0  CL 100  --   --  102 108 109 108  CO2 21*  --   --  18* 20* 22 22  GLUCOSE 117*  --   --  116* 140* 118* 105*  BUN 30*  --   --  29* 32* 28* 25*  CREATININE 2.49*  --   --  2.35* 1.89* 1.60* 1.66*  1.55*  CALCIUM 10.4*  --   --  9.3 9.2 9.8 10.1  MG  --  1.2*  --  1.4* 1.7 1.9 1.6*  PHOS  --   --  2.8 3.2 4.1 3.6 3.8   Liver Function Tests: Recent Labs  Lab 01/23/19 1827 01/24/19 0321 01/26/19 0511 01/27/19 0412  AST 23 21 15 16   ALT 12 11 10 12   ALKPHOS 104 90 72 71  BILITOT 0.2* 0.6 0.4 0.3  PROT 6.3* 5.8* 5.9* 5.8*  ALBUMIN 3.1* 2.9* 2.8* 2.9*   No results for input(s): LIPASE, AMYLASE in the last 168 hours. No results for input(s): AMMONIA in the last 168 hours. CBC: Recent Labs  Lab 01/24/19 0138 01/24/19 0321 01/25/19 0616 01/26/19 0511 01/27/19 0412  WBC 9.3 9.9 5.4 5.8 5.6  NEUTROABS 4.5 4.7 2.8 2.4 2.1  HGB 9.7*  9.4* 8.5* 8.8* 9.0*  HCT 29.6* 29.1* 26.6* 27.3* 29.0*  MCV 84.1 84.3 87.2 85.8 86.6  PLT 186 217 159 198 244   Cardiac Enzymes: Recent Labs  Lab 01/23/19 2140 01/27/19 0412  CKTOTAL  --  7*  TROPONINI <0.03  --    BNP:  Invalid input(s): POCBNP CBG: Recent Labs  Lab 01/26/19 2052 01/27/19 0004 01/27/19 0411 01/27/19 0741 01/27/19 1124  GLUCAP 128* 115* 101* 93 97   D-Dimer No results for input(s): DDIMER in the last 72 hours. Hgb A1c No results for input(s): HGBA1C in the last 72 hours. Lipid Profile No results for input(s): CHOL, HDL, LDLCALC, TRIG, CHOLHDL, LDLDIRECT in the last 72 hours. Thyroid function studies No results for input(s): TSH, T4TOTAL, T3FREE, THYROIDAB in the last 72 hours.  Invalid input(s): FREET3 Anemia work up No results for input(s): VITAMINB12, FOLATE, FERRITIN, TIBC, IRON, RETICCTPCT in the last 72 hours. Urinalysis    Component Value Date/Time   COLORURINE YELLOW 01/23/2019 2006   APPEARANCEUR HAZY (A) 01/23/2019 2006   LABSPEC 1.010 01/23/2019 2006   PHURINE 5.0 01/23/2019 2006   GLUCOSEU NEGATIVE 01/23/2019 2006   HGBUR NEGATIVE 01/23/2019 2006   BILIRUBINUR NEGATIVE 01/23/2019 2006   BILIRUBINUR negaitive 05/13/2016 [place] 01/23/2019 2006   PROTEINUR NEGATIVE 01/23/2019 2006   UROBILINOGEN 0.2 05/13/2016 1748   UROBILINOGEN 1.0 03/31/2015 0117   NITRITE NEGATIVE 01/23/2019 2006   LEUKOCYTESUR TRACE (A) 01/23/2019 2006   Sepsis Labs Invalid input(s): PROCALCITONIN,  WBC,  LACTICIDVEN Microbiology Recent Results (from the past 240 hour(s))  Blood Culture (routine x 2)     Status: Abnormal   Collection Time: 01/23/19  6:27 PM  Result Value Ref Range Status   Specimen Description   Final    RIGHT ANTECUBITAL Performed at [facility], [place] [address], [place], [place] [address]    Special Requests   Final    BOTTLES DRAWN AEROBIC AND ANAEROBIC Blood Culture adequate volume Performed at  [facility] [address], [place], [place] [address]    Culture  Setup Time   Final    GRAM POSITIVE COCCI IN PAIRS IN BOTH AEROBIC AND ANAEROBIC BOTTLES CRITICAL RESULT CALLED TO, READ BACK BY AND VERIFIED WITH: Karel Jarvis PharmD 11:05 01/24/19 ([email or web address]) Performed at [facility], [place] [address] [place],  [address]    Culture ENTEROCOCCUS FAECALIS (A)  Final   Report Status 01/26/2019 FINAL  Final   Organism ID, Bacteria ENTEROCOCCUS FAECALIS  Final      Susceptibility   Enterococcus faecalis - MIC*    AMPICILLIN <=2 SENSITIVE Sensitive     VANCOMYCIN 1 SENSITIVE Sensitive     GENTAMICIN SYNERGY RESISTANT Resistant     * ENTEROCOCCUS FAECALIS  Blood Culture ID Panel (Reflexed)     Status: Abnormal   Collection Time: 01/23/19  6:27 PM  Result Value Ref Range Status   Enterococcus species DETECTED (A) NOT DETECTED Final    Comment: CRITICAL RESULT CALLED TO, READ BACK BY AND VERIFIED WITH: Karel Jarvis PharmD 11:05 01/24/19 ([email or web address])    Vancomycin resistance NOT DETECTED NOT DETECTED Final   Listeria monocytogenes NOT DETECTED NOT DETECTED Final   Staphylococcus species NOT DETECTED NOT DETECTED Final   Staphylococcus aureus (BCID) NOT DETECTED NOT DETECTED Final   Streptococcus species NOT DETECTED NOT DETECTED Final   Streptococcus agalactiae NOT DETECTED NOT DETECTED Final   Streptococcus pneumoniae NOT DETECTED NOT DETECTED Final   Streptococcus pyogenes NOT DETECTED NOT DETECTED Final   Acinetobacter baumannii NOT DETECTED NOT DETECTED Final   Enterobacteriaceae species NOT DETECTED NOT DETECTED Final   Enterobacter cloacae complex NOT DETECTED NOT DETECTED Final   Escherichia coli NOT DETECTED NOT DETECTED Final   Klebsiella oxytoca NOT DETECTED NOT DETECTED Final  Klebsiella pneumoniae NOT DETECTED NOT DETECTED Final   Proteus species NOT DETECTED NOT DETECTED Final   Serratia marcescens NOT DETECTED NOT DETECTED Final   Haemophilus  influenzae NOT DETECTED NOT DETECTED Final   Neisseria meningitidis NOT DETECTED NOT DETECTED Final   Pseudomonas aeruginosa NOT DETECTED NOT DETECTED Final   Candida albicans NOT DETECTED NOT DETECTED Final   Candida glabrata NOT DETECTED NOT DETECTED Final   Candida krusei NOT DETECTED NOT DETECTED Final   Candida parapsilosis NOT DETECTED NOT DETECTED Final   Candida tropicalis NOT DETECTED NOT DETECTED Final    Comment: Performed at [facility], [place] [address], [place], [place] [address]  Blood Culture (routine x 2)     Status: Abnormal   Collection Time: 01/23/19  6:32 PM  Result Value Ref Range Status   Specimen Description   Final    PORTA CATH Performed at [facility] [address], [place], [place] [address]    Special Requests   Final    BOTTLES DRAWN AEROBIC AND ANAEROBIC Blood Culture adequate volume Performed at [facility] [address], [place], [place] [address]    Culture  Setup Time   Final    GRAM POSITIVE COCCI IN BOTH AEROBIC AND ANAEROBIC BOTTLES CRITICAL VALUE NOTED.  VALUE IS CONSISTENT WITH PREVIOUSLY REPORTED AND CALLED VALUE.    Culture (A)  Final    ENTEROCOCCUS FAECALIS SUSCEPTIBILITIES PERFORMED ON PREVIOUS CULTURE WITHIN THE LAST 5 DAYS. Performed at [facility], [place] [address], [place], [place] [address]    Report Status 01/26/2019 FINAL  Final  Urine culture     Status: Abnormal   Collection Time: 01/23/19  8:06 PM  Result Value Ref Range Status   Specimen Description   Final    URINE, CATHETERIZED Performed at [facility] [address], [place], [place] [address]    Special Requests   Final    Immunocompromised Performed at [facility], [place] [address] [place], [place] [address]    Culture (A)  Final    >=100,000 COLONIES/mL ENTEROCOCCUS FAECALIS 80,000 COLONIES/mL KLEBSIELLA PNEUMONIAE    Report Status 01/25/2019 FINAL  Final   Organism  ID, Bacteria KLEBSIELLA PNEUMONIAE (A)  Final   Organism ID, Bacteria ENTEROCOCCUS FAECALIS (A)  Final      Susceptibility   Enterococcus faecalis - MIC*    AMPICILLIN <=2 SENSITIVE Sensitive     LEVOFLOXACIN 1 SENSITIVE Sensitive     NITROFURANTOIN <=16 SENSITIVE Sensitive     VANCOMYCIN 1 SENSITIVE Sensitive     * >=100,000 COLONIES/mL ENTEROCOCCUS FAECALIS   Klebsiella pneumoniae - MIC*    AMPICILLIN >=32 RESISTANT Resistant     CEFAZOLIN 8 SENSITIVE Sensitive     CEFTRIAXONE <=1 SENSITIVE Sensitive     CIPROFLOXACIN <=0.25 SENSITIVE Sensitive     GENTAMICIN <=1 SENSITIVE Sensitive     IMIPENEM <=0.25 SENSITIVE Sensitive     NITROFURANTOIN 32 SENSITIVE Sensitive     TRIMETH/SULFA <=20 SENSITIVE Sensitive     AMPICILLIN/SULBACTAM 16 INTERMEDIATE Intermediate     PIP/TAZO <=4 SENSITIVE Sensitive     Extended ESBL NEGATIVE Sensitive     * 80,000 COLONIES/mL KLEBSIELLA PNEUMONIAE  SARS Coronavirus 2 [facility] order, Performed in [facility] hospital lab)     Status: None   Collection Time: 01/23/19 10:51 PM  Result Value Ref Range Status   SARS Coronavirus 2 NEGATIVE NEGATIVE Final    Comment: (NOTE) If result is  NEGATIVE SARS-CoV-2 target nucleic acids are NOT DETECTED. The SARS-CoV-2 RNA is generally detectable in upper and lower  respiratory specimens during the acute phase of infection. The lowest  concentration of SARS-CoV-2 viral copies this assay can detect is 250  copies / mL. A negative result does not preclude SARS-CoV-2 infection  and should not be used as the sole basis for treatment or other  patient management decisions.  A negative result may occur with  improper specimen collection / handling, submission of specimen other  than nasopharyngeal swab, presence of viral mutation(s) within the  areas targeted by this assay, and inadequate number of viral copies  (<250 copies / mL). A negative result must be combined with clinical  observations, patient history, and  epidemiological information. If result is POSITIVE SARS-CoV-2 target nucleic acids are DETECTED. The SARS-CoV-2 RNA is generally detectable in upper and lower  respiratory specimens dur ing the acute phase of infection.  Positive  results are indicative of active infection with SARS-CoV-2.  Clinical  correlation with patient history and other diagnostic information is  necessary to determine patient infection status.  Positive results do  not rule out bacterial infection or co-infection with other viruses. If result is PRESUMPTIVE POSTIVE SARS-CoV-2 nucleic acids MAY BE PRESENT.   A presumptive positive result was obtained on the submitted specimen  and confirmed on repeat testing.  While 2019 novel coronavirus  (SARS-CoV-2) nucleic acids may be present in the submitted sample  additional confirmatory testing may be necessary for epidemiological  and / or clinical management purposes  to differentiate between  SARS-CoV-2 and other Sarbecovirus currently known to infect humans.  If clinically indicated additional testing with an alternate test  methodology [phone]) is advised. The SARS-CoV-2 RNA is generally  detectable in upper and lower respiratory sp ecimens during the acute  phase of infection. The expected result is Negative. Fact Sheet for Patients:  StrictlyIdeas.no Fact Sheet for Healthcare Providers: BankingDealers.co.za This test is not yet approved or cleared by the [place] FDA and has been authorized for detection and/or diagnosis of SARS-CoV-2 by FDA under an Emergency Use Authorization (EUA).  This EUA will remain in effect (meaning this test can be used) for the duration of the COVID-19 declaration under Section 564(b)(1) of the Act, 21 U.S.C. section 360bbb-3(b)(1), unless the authorization is terminated or revoked sooner. Performed at [facility], [place] [address], [place], [place] [address]    Respiratory Panel by PCR     Status: None   Collection Time: 01/24/19 12:13 AM  Result Value Ref Range Status   Adenovirus NOT DETECTED NOT DETECTED Final   Coronavirus 229E NOT DETECTED NOT DETECTED Final    Comment: (NOTE) The Coronavirus on the Respiratory Panel, DOES NOT test for the novel  Coronavirus (2019 nCoV)    Coronavirus HKU1 NOT DETECTED NOT DETECTED Final   Coronavirus NL63 NOT DETECTED NOT DETECTED Final   Coronavirus OC43 NOT DETECTED NOT DETECTED Final   Metapneumovirus NOT DETECTED NOT DETECTED Final   Rhinovirus / Enterovirus NOT DETECTED NOT DETECTED Final   Influenza A NOT DETECTED NOT DETECTED Final   Influenza B NOT DETECTED NOT DETECTED Final   Parainfluenza Virus 1 NOT DETECTED NOT DETECTED Final   Parainfluenza Virus 2 NOT DETECTED NOT DETECTED Final   Parainfluenza Virus 3 NOT DETECTED NOT DETECTED Final   Parainfluenza Virus 4 NOT DETECTED NOT DETECTED Final   Respiratory Syncytial Virus NOT DETECTED NOT DETECTED Final   Bordetella pertussis NOT DETECTED  NOT DETECTED Final   Chlamydophila pneumoniae NOT DETECTED NOT DETECTED Final   Mycoplasma pneumoniae NOT DETECTED NOT DETECTED Final    Comment: Performed at [facility], [place] [address], [place], [place] [address]  Culture, blood (routine x 2)     Status: None (Preliminary result)   Collection Time: 01/24/19  1:47 PM  Result Value Ref Range Status   Specimen Description   Final    BLOOD RIGHT HAND Performed at [facility] [address], [place], [place] [address]    Special Requests   Final    BOTTLES DRAWN AEROBIC AND ANAEROBIC Blood Culture adequate volume Performed at [facility] [address], [place], [place] [address]    Culture   Final    NO GROWTH 3 DAYS Performed at [facility], [place] [address], [place], [place] [address]    Report Status PENDING  Incomplete  MRSA PCR Screening     Status: None   Collection Time: 01/24/19  6:30  PM  Result Value Ref Range Status   MRSA by PCR NEGATIVE NEGATIVE Final    Comment:        The GeneXpert MRSA Assay (FDA approved for NASAL specimens only), is one component of a comprehensive MRSA colonization surveillance program. It is not intended to diagnose MRSA infection nor to guide or monitor treatment for MRSA infections. Performed at [facility], [place] [address], [place], [place] [address]    Time coordinating discharge: 35 minutes  SIGNED:  Kerney Elbe, DO [facility] 01/27/2019, 6:37 PM Pager is on [place]  If 7PM-7AM, please contact night-coverage [email or web address] Password [email or web address]

## 2019-01-27 NOTE — TOC Transition Note (Signed)
Transition of Care [facility]) - CM/SW Discharge Note   Patient Details  Name: Eric Price MRN: [id number] Date of Birth: 1950/12/11  Transition of Care [facility]) CM/SW Contact:  Leeroy Cha, RN Phone Number: 01/27/2019, 10:04 AM   Clinical Narrative:    Discharge to home with [facility] abx [facility]/Pam [facility] handling with [facility].   Final next level of care: [place] Barriers to Discharge: No Barriers Identified   Patient Goals and CMS Choice Patient states their goals for this hospitalization and ongoing recovery are:: to just gp home and get better CMS Medicare.gov Compare Post Acute Care list provided to:: Patient Choice offered to / list presented to : Patient  Discharge Placement                       Discharge Plan and Services   Discharge Planning Services: CM Consult Post Acute Care Choice: Durable Medical Equipment, Home Health          DME Arranged: IV pump/equipment DME Agency: [employer] Date DME Agency Contacted: 01/27/19 Time DME Agency Contacted: 1001 Representative spoke with at DME Agency: [place]: RN, IV Antibiotics [place] Agency: [place] (Sisters) Date [place]: 01/27/19 Time [place]: 5732 Representative spoke with at [place]: Carolynn Sayers and [place] Determinants of Health (SDOH) Interventions     Readmission Risk Interventions No flowsheet data found.

## 2019-01-27 NOTE — Progress Notes (Signed)
[place] for Infectious Disease   Reason for visit: Follow up on bacteremia  Interval History: repeat blood culture ngtd; afebrile, WBC wnl.  No complaints.  No associated rash or diarrhea. CK7   Physical Exam: Constitutional:  Vitals:   01/26/19 2049 01/27/19 0413  BP: 119/78 127/77  Pulse: 94 94  Resp: 20 18  Temp: 98.3 F (36.8 C) 98 F (36.7 C)  SpO2: 99% 96%   patient appears in NAD Eyes: anicteric HENT: no thrush Respiratory: Normal respiratory effort; CTA B Cardiovascular: RRR GI: soft, nt, nd  Review of Systems: Constitutional: negative for fevers, chills and anorexia Gastrointestinal: negative for diarrhea   Lab Results  Component Value Date   WBC 5.6 01/27/2019   HGB 9.0 (L) 01/27/2019   HCT 29.0 (L) 01/27/2019   MCV 86.6 01/27/2019   PLT 244 01/27/2019    Lab Results  Component Value Date   CREATININE 1.55 (H) 01/27/2019   CREATININE 1.66 (H) 01/27/2019   BUN 25 (H) 01/27/2019   NA 138 01/27/2019   K 4.0 01/27/2019   CL 108 01/27/2019   CO2 22 01/27/2019    Lab Results  Component Value Date   ALT 12 01/27/2019   AST 16 01/27/2019   ALKPHOS 71 01/27/2019     Microbiology: Recent Results (from the past 240 hour(s))  Blood Culture (routine x 2)     Status: Abnormal   Collection Time: 01/23/19  6:27 PM  Result Value Ref Range Status   Specimen Description   Final    RIGHT ANTECUBITAL Performed at [facility], [place] [address] [place], [place] [address]    Special Requests   Final    BOTTLES DRAWN AEROBIC AND ANAEROBIC Blood Culture adequate volume Performed at [place] [address] [place],  [address]    Culture  Setup Time   Final    GRAM POSITIVE COCCI IN PAIRS IN BOTH AEROBIC AND ANAEROBIC BOTTLES CRITICAL RESULT CALLED TO, READ BACK BY AND VERIFIED WITH: Karel Jarvis PharmD 11:05 01/24/19 ([email or web address]) Performed at [facility], [place] [address], [place],  [address]   Culture ENTEROCOCCUS FAECALIS (A)  Final   Report Status 01/26/2019 FINAL  Final   Organism ID, Bacteria ENTEROCOCCUS FAECALIS  Final      Susceptibility   Enterococcus faecalis - MIC*    AMPICILLIN <=2 SENSITIVE Sensitive     VANCOMYCIN 1 SENSITIVE Sensitive     GENTAMICIN SYNERGY RESISTANT Resistant     * ENTEROCOCCUS FAECALIS  Blood Culture ID Panel (Reflexed)     Status: Abnormal   Collection Time: 01/23/19  6:27 PM  Result Value Ref Range Status   Enterococcus species DETECTED (A) NOT DETECTED Final    Comment: CRITICAL RESULT CALLED TO, READ BACK BY AND VERIFIED WITH: Karel Jarvis PharmD 11:05 01/24/19 ([email or web address])    Vancomycin resistance NOT DETECTED NOT DETECTED Final   Listeria monocytogenes NOT DETECTED NOT DETECTED Final   Staphylococcus species NOT DETECTED NOT DETECTED Final   Staphylococcus aureus (BCID) NOT DETECTED NOT DETECTED Final   Streptococcus species NOT DETECTED NOT DETECTED Final   Streptococcus agalactiae NOT DETECTED NOT DETECTED Final   Streptococcus pneumoniae NOT DETECTED NOT DETECTED Final   Streptococcus pyogenes NOT DETECTED NOT DETECTED Final   Acinetobacter baumannii NOT DETECTED NOT DETECTED Final   Enterobacteriaceae species NOT DETECTED NOT DETECTED Final   Enterobacter cloacae complex NOT DETECTED NOT DETECTED Final   Escherichia coli NOT DETECTED NOT DETECTED  Final   Klebsiella oxytoca NOT DETECTED NOT DETECTED Final   Klebsiella pneumoniae NOT DETECTED NOT DETECTED Final   Proteus species NOT DETECTED NOT DETECTED Final   Serratia marcescens NOT DETECTED NOT DETECTED Final   Haemophilus influenzae NOT DETECTED NOT DETECTED Final   Neisseria meningitidis NOT DETECTED NOT DETECTED Final   Pseudomonas aeruginosa NOT DETECTED NOT DETECTED Final   Candida albicans NOT DETECTED NOT DETECTED Final   Candida glabrata NOT DETECTED NOT DETECTED Final   Candida krusei NOT DETECTED NOT DETECTED Final   Candida parapsilosis NOT DETECTED NOT DETECTED Final    Candida tropicalis NOT DETECTED NOT DETECTED Final    Comment: Performed at [facility], [place] [address] [place], [place] [address]  Blood Culture (routine x 2)     Status: Abnormal   Collection Time: 01/23/19  6:32 PM  Result Value Ref Range Status   Specimen Description   Final    PORTA CATH Performed at [place] [address] [place], [place] [address]    Special Requests   Final    BOTTLES DRAWN AEROBIC AND ANAEROBIC Blood Culture adequate volume Performed at [place] [address], [place], [place] [address]    Culture  Setup Time   Final    GRAM POSITIVE COCCI IN BOTH AEROBIC AND ANAEROBIC BOTTLES CRITICAL VALUE NOTED.  VALUE IS CONSISTENT WITH PREVIOUSLY REPORTED AND CALLED VALUE.    Culture (A)  Final    ENTEROCOCCUS FAECALIS SUSCEPTIBILITIES PERFORMED ON PREVIOUS CULTURE WITHIN THE LAST 5 DAYS. Performed at [facility], [place] [address] [place], [place] [address]    Report Status 01/26/2019 FINAL  Final  Urine culture     Status: Abnormal   Collection Time: 01/23/19  8:06 PM  Result Value Ref Range Status   Specimen Description   Final    URINE, CATHETERIZED Performed at [place] [address] [place], [place] [address]    Special Requests   Final    Immunocompromised Performed at [facility], [place] [address] [place], [place] [address]    Culture (A)  Final    >=100,000 COLONIES/mL ENTEROCOCCUS FAECALIS 80,000 COLONIES/mL KLEBSIELLA PNEUMONIAE    Report Status 01/25/2019 FINAL  Final   Organism ID, Bacteria KLEBSIELLA PNEUMONIAE (A)  Final   Organism ID, Bacteria ENTEROCOCCUS FAECALIS (A)  Final      Susceptibility   Enterococcus faecalis - MIC*    AMPICILLIN <=2 SENSITIVE Sensitive     LEVOFLOXACIN 1 SENSITIVE Sensitive     NITROFURANTOIN <=16 SENSITIVE Sensitive     VANCOMYCIN 1 SENSITIVE Sensitive     * >=100,000 COLONIES/mL ENTEROCOCCUS FAECALIS    Klebsiella pneumoniae - MIC*    AMPICILLIN >=32 RESISTANT Resistant     CEFAZOLIN 8 SENSITIVE Sensitive     CEFTRIAXONE <=1 SENSITIVE Sensitive     CIPROFLOXACIN <=0.25 SENSITIVE Sensitive     GENTAMICIN <=1 SENSITIVE Sensitive     IMIPENEM <=0.25 SENSITIVE Sensitive     NITROFURANTOIN 32 SENSITIVE Sensitive     TRIMETH/SULFA <=20 SENSITIVE Sensitive     AMPICILLIN/SULBACTAM 16 INTERMEDIATE Intermediate     PIP/TAZO <=4 SENSITIVE Sensitive     Extended ESBL NEGATIVE Sensitive     * 80,000 COLONIES/mL KLEBSIELLA PNEUMONIAE  SARS Coronavirus 2 [facility] order, Performed in [place] hospital lab)     Status: None   Collection Time: 01/23/19 10:51 PM  Result Value Ref Range Status   SARS Coronavirus  2 NEGATIVE NEGATIVE Final    Comment: (NOTE) If result is NEGATIVE SARS-CoV-2 target nucleic acids are NOT DETECTED. The SARS-CoV-2 RNA is generally detectable in upper and lower  respiratory specimens during the acute phase of infection. The lowest  concentration of SARS-CoV-2 viral copies this assay can detect is 250  copies / mL. A negative result does not preclude SARS-CoV-2 infection  and should not be used as the sole basis for treatment or other  patient management decisions.  A negative result may occur with  improper specimen collection / handling, submission of specimen other  than nasopharyngeal swab, presence of viral mutation(s) within the  areas targeted by this assay, and inadequate number of viral copies  (<250 copies / mL). A negative result must be combined with clinical  observations, patient history, and epidemiological information. If result is POSITIVE SARS-CoV-2 target nucleic acids are DETECTED. The SARS-CoV-2 RNA is generally detectable in upper and lower  respiratory specimens dur ing the acute phase of infection.  Positive  results are indicative of active infection with SARS-CoV-2.  Clinical  correlation with patient history and other diagnostic  information is  necessary to determine patient infection status.  Positive results do  not rule out bacterial infection or co-infection with other viruses. If result is PRESUMPTIVE POSTIVE SARS-CoV-2 nucleic acids MAY BE PRESENT.   A presumptive positive result was obtained on the submitted specimen  and confirmed on repeat testing.  While 2019 novel coronavirus  (SARS-CoV-2) nucleic acids may be present in the submitted sample  additional confirmatory testing may be necessary for epidemiological  and / or clinical management purposes  to differentiate between  SARS-CoV-2 and other Sarbecovirus currently known to infect humans.  If clinically indicated additional testing with an alternate test  methodology [phone]) is advised. The SARS-CoV-2 RNA is generally  detectable in upper and lower respiratory sp ecimens during the acute  phase of infection. The expected result is Negative. Fact Sheet for Patients:  StrictlyIdeas.no Fact Sheet for Healthcare Providers: BankingDealers.co.za This test is not yet approved or cleared by the [place] FDA and has been authorized for detection and/or diagnosis of SARS-CoV-2 by FDA under an Emergency Use Authorization (EUA).  This EUA will remain in effect (meaning this test can be used) for the duration of the COVID-19 declaration under Section 564(b)(1) of the Act, 21 U.S.C. section 360bbb-3(b)(1), unless the authorization is terminated or revoked sooner. Performed at [facility], [place] [address] [place], [place] [address]   Respiratory Panel by PCR     Status: None   Collection Time: 01/24/19 12:13 AM  Result Value Ref Range Status   Adenovirus NOT DETECTED NOT DETECTED Final   Coronavirus 229E NOT DETECTED NOT DETECTED Final    Comment: (NOTE) The Coronavirus on the Respiratory Panel, DOES NOT test for the novel  Coronavirus (2019 nCoV)    Coronavirus HKU1 NOT DETECTED  NOT DETECTED Final   Coronavirus NL63 NOT DETECTED NOT DETECTED Final   Coronavirus OC43 NOT DETECTED NOT DETECTED Final   Metapneumovirus NOT DETECTED NOT DETECTED Final   Rhinovirus / Enterovirus NOT DETECTED NOT DETECTED Final   Influenza A NOT DETECTED NOT DETECTED Final   Influenza B NOT DETECTED NOT DETECTED Final   Parainfluenza Virus 1 NOT DETECTED NOT DETECTED Final   Parainfluenza Virus 2 NOT DETECTED NOT DETECTED Final   Parainfluenza Virus 3 NOT DETECTED NOT DETECTED Final   Parainfluenza Virus 4 NOT DETECTED NOT DETECTED Final   Respiratory Syncytial  Virus NOT DETECTED NOT DETECTED Final   Bordetella pertussis NOT DETECTED NOT DETECTED Final   Chlamydophila pneumoniae NOT DETECTED NOT DETECTED Final   Mycoplasma pneumoniae NOT DETECTED NOT DETECTED Final    Comment: Performed at [facility], [place] [address] [place], [place] [address]  Culture, blood (routine x 2)     Status: None (Preliminary result)   Collection Time: 01/24/19  1:47 PM  Result Value Ref Range Status   Specimen Description   Final    BLOOD RIGHT HAND Performed at [place] [address], [place], [place] [address]    Special Requests   Final    BOTTLES DRAWN AEROBIC AND ANAEROBIC Blood Culture adequate volume Performed at [place] [address] [place], [place] [address]    Culture   Final    NO GROWTH 3 DAYS Performed at [facility], [place] [address], [place], [place] [address]    Report Status PENDING  Incomplete  MRSA PCR Screening     Status: None   Collection Time: 01/24/19  6:30 PM  Result Value Ref Range Status   MRSA by PCR NEGATIVE NEGATIVE Final    Comment:        The GeneXpert MRSA Assay (FDA approved for NASAL specimens only), is one component of a comprehensive MRSA colonization surveillance program. It is not intended to diagnose MRSA infection nor to guide or monitor treatment for MRSA infections. Performed at  [facility], [place] [address] [place], [place] [address]     Impression/Plan:  1. Bacteremia - with recurrent Enterococcus, I am concerned of a deep infection.  I would treat this as a prolonged 6 week course if full treatment desired through June 2nd.  I do not think a TEE is absolutely indicated as I do not feel he is a good candidate to have his ICD removed, which he is dependent on and otherwise can just treat for endocarditis.  I would then use oral suppression with amoxicillin 500 mg three times a day indefinitely after that.   He should have his picc line removed after treatment  We can use daptomycin and will start this.  His insurance will cover it.    2.  Renal insufficiency - stable creat.  Would avoid vancomycin as an alternative.   3.  Palliative care - wants full treatment for this so may not be able to continue with hospice.    I have arranged follow up with him toward the end of treatment.  I will sign off, thanks for consultation

## 2019-01-27 NOTE — Progress Notes (Signed)
PHARMACY CONSULT NOTE FOR:  OUTPATIENT  PARENTERAL ANTIBIOTIC THERAPY (OPAT)  Indication: enterococcus bacteremia Regimen: daptomycin 800mg  IV q24h End date: 02/04/2019  IV antibiotic discharge orders are pended. To discharging provider:  please sign these orders via discharge navigator,  Select New Orders & click on the button choice - Manage This Unsigned Work.     Thank you for allowing pharmacy to be a part of this patient's care.  Doreene Eland, PharmD, BCPS.   Work Cell: [phone] 01/27/2019 12:24 PM

## 2019-01-28 DIAGNOSIS — A4181 Sepsis due to Enterococcus: Secondary | ICD-10-CM | POA: Diagnosis not present

## 2019-01-28 DIAGNOSIS — Z452 Encounter for adjustment and management of vascular access device: Secondary | ICD-10-CM | POA: Diagnosis not present

## 2019-01-28 DIAGNOSIS — C8400 Mycosis fungoides, unspecified site: Secondary | ICD-10-CM | POA: Diagnosis not present

## 2019-01-28 DIAGNOSIS — A419 Sepsis, unspecified organism: Secondary | ICD-10-CM | POA: Diagnosis not present

## 2019-01-28 DIAGNOSIS — E1122 Type 2 diabetes mellitus with diabetic chronic kidney disease: Secondary | ICD-10-CM | POA: Diagnosis not present

## 2019-01-28 DIAGNOSIS — Z4800 Encounter for change or removal of nonsurgical wound dressing: Secondary | ICD-10-CM | POA: Diagnosis not present

## 2019-01-28 DIAGNOSIS — J189 Pneumonia, unspecified organism: Secondary | ICD-10-CM | POA: Diagnosis not present

## 2019-01-28 DIAGNOSIS — I2510 Atherosclerotic heart disease of native coronary artery without angina pectoris: Secondary | ICD-10-CM | POA: Diagnosis not present

## 2019-01-28 DIAGNOSIS — I5042 Chronic combined systolic (congestive) and diastolic (congestive) heart failure: Secondary | ICD-10-CM | POA: Diagnosis not present

## 2019-01-28 DIAGNOSIS — I130 Hypertensive heart and chronic kidney disease with heart failure and stage 1 through stage 4 chronic kidney disease, or unspecified chronic kidney disease: Secondary | ICD-10-CM | POA: Diagnosis not present

## 2019-01-28 DIAGNOSIS — R234 Changes in skin texture: Secondary | ICD-10-CM | POA: Diagnosis not present

## 2019-01-29 DIAGNOSIS — J189 Pneumonia, unspecified organism: Secondary | ICD-10-CM | POA: Diagnosis not present

## 2019-01-29 DIAGNOSIS — Z89512 Acquired absence of left leg below knee: Secondary | ICD-10-CM | POA: Diagnosis not present

## 2019-01-29 DIAGNOSIS — A419 Sepsis, unspecified organism: Secondary | ICD-10-CM | POA: Diagnosis not present

## 2019-01-29 LAB — CULTURE, BLOOD (ROUTINE X 2)
Culture: NO GROWTH
Special Requests: ADEQUATE

## 2019-01-30 ENCOUNTER — Other Ambulatory Visit: Payer: Self-pay | Admitting: *Deleted

## 2019-01-30 ENCOUNTER — Telehealth: Payer: Self-pay | Admitting: *Deleted

## 2019-01-30 ENCOUNTER — Telehealth: Payer: Self-pay

## 2019-01-30 DIAGNOSIS — A419 Sepsis, unspecified organism: Secondary | ICD-10-CM | POA: Diagnosis not present

## 2019-01-30 DIAGNOSIS — J189 Pneumonia, unspecified organism: Secondary | ICD-10-CM | POA: Diagnosis not present

## 2019-01-30 NOTE — Patient Outreach (Signed)
[facility]) Care Management  01/30/2019  Indigo Chaddock Jan 22, 1951 [id number]   EMMI-general discharge from [facility] 01/27/19  RED ON [place] Day # 1 Date: 01/29/19 1348 Red Alert Reason: 1) Questions about discharge papers? Yes 2) Scheduled follow-up? No 3) Unfilled prescriptions? Yes 4) Other questions/problems? Yes Insurance: Government social research officer  [facility] admissions x  3 ED visits x 3  in the last 6 months    Outreach attempt # 1 successful at the home number  Patient and wife are able to verify HIPAA [facility] Care Management RN reviewed and addressed red alert with patient and wife, [name]  Mr Jani to speak with him and his wife on speaker phone Transition of care services noted to be completed by primary care MD office staff- [facility] this am 01/30/19- CM also reviewed Transition of care questions with pt/wife  EMMI: All answers on 01/29/19 is correct. They voiced concern about the process for the wound care and needing home health staff to teach Mrs Kinn how to do the wound care in the home and DME needs (pending delivery of a hospital bed)  [facility] staff visited today 01/30/19 to change the dressing, to do IV antibiotics and to teach the wife. [facility] staff is scheduled to return on Monday Feb 06 2019 per Mr Word.  Mrs [name] confirms she has had training on IV administration. She confirms they have all the medications except for some she will be picking up from the local pharmacy. She denies concerns with him taking medications as prescribed, affording medications, side effects of medications and questions about medications.  [facility] RN CM found a 01/24/19 [facility] wound care note from Val Riles to include the lower abdomen and abdominal fold fissure wound care procedure as following- Dressing procedure/placement/frequency: Gently cleanse abdominal fissures with no rinse spray body wash. Pat dry. Apply Xeroform guaze and ABD pads. Change  daily.  [facility] RN CM spoke with Evelena Peat at Dr Elease Hashimoto office to leave a message and request for home health RN for wound care order with dressing procedure Left CM number  [facility] RN CM called to [facility] to also share pt/wife wound care concern. CM spoke with Merry Proud who informs RN CM that there is no specified wound care HHRN. He reports each visiting [facility] RN will do the pt wound care  Transferred to [place] who states the [facility] RN called DR Linus Salmons today on 01/30/19 to request wound care orders   [facility] RN CM called to update pt/wife   Mrs Massman voiced concerns with awaiting a new hospital bed,. She reports a hospital bed provided by hospice that was returned with possible cost concerns.She was encouraged to follow the appeal process in the explanation of benefit or coverage letter.  She confirms Mr Berisha is no longer receiving hospice care as he is getting IV antibiotic treatments.  She states pending delivery of a hospital bed CM discussed and provided [phone] for [employer] to assist with the hospital bed    [facility] RN CM spoke with Jeanette Caprice at [place] to inquire about the status of delivery of the bed and was informed it was noted to have been delivered. Jeanette Caprice verified the pt address and contact number and sent a message to further [employer] staff to reach out to the patient  Social: Mr Pepper lives with his wife the primary care giver. He is needing assistance with his care needs and his wife provides transportation assistance    Conditions:  suspected COVID 19 virus infection HTN, stable angina, critical lowe limb ischemia, nonischemic cardio myopathy, PAD, hx of CAP, seasonal allergic rhinitis due to pollen, Left BKA, GERD, DM ty pe 2 hx of oral thrush , dental decay, BPH,  CKD, HLD, hx of depression, memory loss  DME:  IV supplies, IV antibiotic, cbg meter, bedside commode/3 in 1 eyeglasses, cane, nebulizer, shower chair without back, walker, prosthesis hospital bed pending delivery  Appointments: 02/01/19  Wednesday virtual appointment with Dr Elease Hashimoto 03/01/19 Dr Linus Salmons, infectious disease MD hospital follow up   [facility] Directives: Denies need for assist with advance directives   Consent: [name] RN CM reviewed [facility] services with patient. Patient gave verbal consent for services [facility] telephonic RN CM.   Advised patient that there will be further automated EMMI- post discharge calls to assess how the patient is doing following the recent hospitalization Advised the patient that another call may be received from a nurse if any of their responses were abnormal. Patient voiced understanding and was appreciative of f/u call.   Plan: [facility] RN CM will follow up with Mr and Mrs Sakata within the next 4 business days on the status of wound care dn hospital bed   Pt encouraged to return a call to [facility] RN CM prn  [facility] RN CM sent a successful outreach letter as discussed with [facility] brochure enclosed for review  Kimberly L. Lavina Hamman, RN, BSN, [place] Coordinator Office number [phone] Mobile number [phone]  Main [facility] number [phone] Fax number [phone]

## 2019-01-30 NOTE — Telephone Encounter (Signed)
Call received from [facility] requesting therapy orders for patient. Routing to MD for [place]. Orders can be called directly back to Sadie Haber, RN.  Eugenia Mcalpine, LPN

## 2019-01-30 NOTE — Telephone Encounter (Signed)
Transition Care Management Follow-up Telephone Call   Date discharged? 01/27/2019   How have you been since you were released from the hospital? Up and down pretty good though per wife    Do you understand why you were in the hospital? yes   Do you understand the discharge instructions? yes   Where were you discharged to? Home with Home Health    Items Reviewed:  Medications reviewed: yes  Allergies reviewed: yes  Dietary changes reviewed: yes  Referrals reviewed: N/A   Functional Questionnaire:   Activities of Daily Living (ADLs):   He states they are independent in the following: feeding, continence and toileting States they require assistance with the following: ambulation, bathing and hygiene, grooming and dressing   Any transportation issues/concerns?: yes   Any patient concerns? Yes (has good days where the walker works and needs a whee chair ramp built to be able to get in and out of house).    Confirmed importance and date/time of follow-up visits scheduled yes   Provider Appointment booked with Dr. Elease Hashimoto 02/01/2019 at 3:15 PM   Confirmed with patient if condition begins to worsen call PCP or go to the ER.  Patient was given the office number and encouraged to call back with question or concerns.  : yes

## 2019-01-31 ENCOUNTER — Other Ambulatory Visit: Payer: Self-pay

## 2019-01-31 ENCOUNTER — Telehealth: Payer: Self-pay | Admitting: *Deleted

## 2019-01-31 ENCOUNTER — Other Ambulatory Visit: Payer: Self-pay | Admitting: *Deleted

## 2019-01-31 DIAGNOSIS — A419 Sepsis, unspecified organism: Secondary | ICD-10-CM | POA: Diagnosis not present

## 2019-01-31 DIAGNOSIS — J189 Pneumonia, unspecified organism: Secondary | ICD-10-CM | POA: Diagnosis not present

## 2019-01-31 NOTE — Telephone Encounter (Signed)
Copied from [place] [phone]. Topic: General - Other >> Jan 30, 2019  4:02 PM Rayann Heman wrote: Reason for CRM: kim calling with [facility] would like a call back regarding patient dressing a wound. Pt wife could like instructions on wound care and have a wound care nurse come out. Please advise

## 2019-01-31 NOTE — Telephone Encounter (Signed)
Please see message. °

## 2019-01-31 NOTE — Telephone Encounter (Signed)
Please have would care nurse go out.  He is on Hospice and see if they have checked with Hospice first to see what they can offer.

## 2019-01-31 NOTE — Telephone Encounter (Signed)
I called Kim at [phone] and she stated that patient is no longer on Hospice and they are having [place] coming out for would care daily and she sent to Dr. Linus Salmons and this office is approving orders. Sending as an Pharmacist, hospital.

## 2019-01-31 NOTE — Telephone Encounter (Signed)
noted 

## 2019-01-31 NOTE — Telephone Encounter (Signed)
PCP or surgeon needs to decide physical therapy for pt.

## 2019-01-31 NOTE — Patient Outreach (Signed)
[facility]) Care Management  01/31/2019  Eric Price [birth year] [id number]   Care coordination  [facility] RN CM received a call from [facility] from Dr Elease Hashimoto office related to wound care orders.  Updated [name] that [name] is no longer on hospice care but care of [facility] for IV antibiotics and wound care. Discussed that RN CM found out that the [facility] RN had requested wound care orders from Dr Linus Salmons and the pt and wife are updated  Plan: [facility] RN CM will follow up with [name] within the next 4 business days on the status of wound care dn hospital bed   [place] L. Lavina Hamman, RN, BSN, [place] Coordinator Office number [phone] Mobile number [phone]  Main [facility] number [phone] Fax number [phone]

## 2019-01-31 NOTE — Telephone Encounter (Signed)
Lets go ahead and get OT and PT consult through home health.

## 2019-01-31 NOTE — Patient Outreach (Signed)
[facility]) Care Management  01/31/2019  Eric Price May 13, 1951 [id number]   Care coordination and case closure  [facility] RN CM called to follow up on delivery of hospital bed  Patient is able to verify HIPAA Reviewed that [facility] RN CM was following up on the delivery of the hospital bed with patient He confirms at the time of the call he was receiving the hospital bed. He confirms he is dong okay today He and Mrs Rowser was encouraged to return a call to [facility] RN CM prn   Consent: [facility] RN CM reviewed [facility] services with patient. Patient gave verbal consent for services.  Plans. [facility] RN CM will close case at this time as patient has been assessed and no needs identified/needs resolved.   Carma Dwiggins L. Lavina Hamman, RN, BSN, [place] Coordinator Office number [phone] Mobile number [phone]  Main [facility] number [phone] Fax number [phone]

## 2019-02-01 ENCOUNTER — Telehealth: Payer: Self-pay | Admitting: Family Medicine

## 2019-02-01 ENCOUNTER — Ambulatory Visit (INDEPENDENT_AMBULATORY_CARE_PROVIDER_SITE_OTHER): Payer: Medicare Other | Admitting: Family Medicine

## 2019-02-01 ENCOUNTER — Telehealth: Payer: Self-pay

## 2019-02-01 DIAGNOSIS — N183 Chronic kidney disease, stage 3 unspecified: Secondary | ICD-10-CM

## 2019-02-01 DIAGNOSIS — I10 Essential (primary) hypertension: Secondary | ICD-10-CM

## 2019-02-01 DIAGNOSIS — B952 Enterococcus as the cause of diseases classified elsewhere: Secondary | ICD-10-CM

## 2019-02-01 DIAGNOSIS — R790 Abnormal level of blood mineral: Secondary | ICD-10-CM

## 2019-02-01 DIAGNOSIS — C8400 Mycosis fungoides, unspecified site: Secondary | ICD-10-CM | POA: Diagnosis not present

## 2019-02-01 DIAGNOSIS — E1122 Type 2 diabetes mellitus with diabetic chronic kidney disease: Secondary | ICD-10-CM | POA: Diagnosis not present

## 2019-02-01 DIAGNOSIS — I5042 Chronic combined systolic (congestive) and diastolic (congestive) heart failure: Secondary | ICD-10-CM

## 2019-02-01 DIAGNOSIS — A419 Sepsis, unspecified organism: Secondary | ICD-10-CM | POA: Diagnosis not present

## 2019-02-01 DIAGNOSIS — T148XXA Other injury of unspecified body region, initial encounter: Secondary | ICD-10-CM

## 2019-02-01 DIAGNOSIS — I2510 Atherosclerotic heart disease of native coronary artery without angina pectoris: Secondary | ICD-10-CM

## 2019-02-01 DIAGNOSIS — J189 Pneumonia, unspecified organism: Secondary | ICD-10-CM | POA: Diagnosis not present

## 2019-02-01 DIAGNOSIS — Z794 Long term (current) use of insulin: Secondary | ICD-10-CM

## 2019-02-01 DIAGNOSIS — R7881 Bacteremia: Secondary | ICD-10-CM | POA: Diagnosis not present

## 2019-02-01 NOTE — Telephone Encounter (Unsigned)
Copied from [place] [id number]. Topic: Quick Communication - Home Health Verbal Orders >> Feb 01, 2019 12:00 PM Celene Kras A wrote: Caller/Agency: Clair Gulling [place]/ Adv. Home health Callback Number: [phone] [id number] OT/PT/Skilled Nursing/Social Work/Speech Therapy: PT and Nursing  Frequency: PT: 2x for 2wks, 1x for 2wks, 1x every other for 2wks Nursing: 1x for 1wk, 1x for 2wks, 2 PRN.

## 2019-02-01 NOTE — Telephone Encounter (Signed)
Clinic RN gave okay to verbal order for PT and Nursing. Since an earlier note was in for consult.

## 2019-02-01 NOTE — Telephone Encounter (Signed)
[place] to return call  [place] for [facility] to Discuss results / PCP / recommendations / Schedule patient  Left a message for Sadie Haber to let us know if she needs anything further from Dr. Elease Hashimoto.  CRM Created.

## 2019-02-01 NOTE — Telephone Encounter (Signed)
Caller/Agency: [place] Number: [phone] [place] for continued care   I called Wyline Beady and gave her the orders from Dr. Elease Hashimoto: he will need labs with CBC, CMP, magnesium level by Monday.  we would like for home health to draw.    Glenard Haring verbalized an understanding and I gave her our fax number so she can send to [place].

## 2019-02-02 DIAGNOSIS — J189 Pneumonia, unspecified organism: Secondary | ICD-10-CM | POA: Diagnosis not present

## 2019-02-02 DIAGNOSIS — A419 Sepsis, unspecified organism: Secondary | ICD-10-CM | POA: Diagnosis not present

## 2019-02-02 DIAGNOSIS — A4181 Sepsis due to Enterococcus: Secondary | ICD-10-CM | POA: Diagnosis not present
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
## 2019-02-03 ENCOUNTER — Ambulatory Visit: Payer: Medicare HMO | Admitting: Podiatry

## 2019-02-03 DIAGNOSIS — J189 Pneumonia, unspecified organism: Secondary | ICD-10-CM | POA: Diagnosis not present

## 2019-02-03 DIAGNOSIS — A419 Sepsis, unspecified organism: Secondary | ICD-10-CM | POA: Diagnosis not present

## 2019-02-04 DIAGNOSIS — J189 Pneumonia, unspecified organism: Secondary | ICD-10-CM | POA: Diagnosis not present

## 2019-02-04 DIAGNOSIS — A419 Sepsis, unspecified organism: Secondary | ICD-10-CM | POA: Diagnosis not present

## 2019-02-05 DIAGNOSIS — A419 Sepsis, unspecified organism: Secondary | ICD-10-CM | POA: Diagnosis not present

## 2019-02-05 DIAGNOSIS — J189 Pneumonia, unspecified organism: Secondary | ICD-10-CM | POA: Diagnosis not present

## 2019-02-06 ENCOUNTER — Other Ambulatory Visit: Payer: Self-pay | Admitting: Family Medicine

## 2019-02-06 ENCOUNTER — Other Ambulatory Visit: Payer: Self-pay

## 2019-02-06 DIAGNOSIS — J189 Pneumonia, unspecified organism: Secondary | ICD-10-CM | POA: Diagnosis not present

## 2019-02-06 DIAGNOSIS — A419 Sepsis, unspecified organism: Secondary | ICD-10-CM | POA: Diagnosis not present

## 2019-02-06 DIAGNOSIS — Z7689 Persons encountering health services in other specified circumstances: Secondary | ICD-10-CM | POA: Diagnosis not present

## 2019-02-07 ENCOUNTER — Telehealth: Payer: Self-pay

## 2019-02-07 DIAGNOSIS — A419 Sepsis, unspecified organism: Secondary | ICD-10-CM | POA: Diagnosis not present

## 2019-02-07 DIAGNOSIS — J189 Pneumonia, unspecified organism: Secondary | ICD-10-CM | POA: Diagnosis not present

## 2019-02-07 NOTE — Telephone Encounter (Signed)
Unable to leave message for patient to remind of missed remote transmission.  

## 2019-02-08 ENCOUNTER — Telehealth: Payer: Self-pay | Admitting: *Deleted

## 2019-02-08 ENCOUNTER — Other Ambulatory Visit: Payer: Self-pay

## 2019-02-08 ENCOUNTER — Inpatient Hospital Stay (HOSPITAL_COMMUNITY)
Admission: EM | Admit: 2019-02-08 | Discharge: 2019-02-14 | DRG: 871 | Disposition: A | Discharge status: Hospice | Payer: Medicare HMO | Attending: Internal Medicine | Admitting: Internal Medicine

## 2019-02-08 ENCOUNTER — Inpatient Hospital Stay (HOSPITAL_COMMUNITY): Payer: Medicare HMO

## 2019-02-08 ENCOUNTER — Encounter (HOSPITAL_COMMUNITY): Payer: Self-pay

## 2019-02-08 ENCOUNTER — Emergency Department (HOSPITAL_COMMUNITY): Payer: Medicare HMO

## 2019-02-08 DIAGNOSIS — Z885 Allergy status to narcotic agent status: Secondary | ICD-10-CM

## 2019-02-08 DIAGNOSIS — A419 Sepsis, unspecified organism: Secondary | ICD-10-CM | POA: Diagnosis not present

## 2019-02-08 DIAGNOSIS — R6520 Severe sepsis without septic shock: Secondary | ICD-10-CM | POA: Diagnosis not present

## 2019-02-08 DIAGNOSIS — I739 Peripheral vascular disease, unspecified: Secondary | ICD-10-CM | POA: Diagnosis present

## 2019-02-08 DIAGNOSIS — E669 Obesity, unspecified: Secondary | ICD-10-CM | POA: Diagnosis present

## 2019-02-08 DIAGNOSIS — G4730 Sleep apnea, unspecified: Secondary | ICD-10-CM | POA: Diagnosis present

## 2019-02-08 DIAGNOSIS — T7840XA Allergy, unspecified, initial encounter: Secondary | ICD-10-CM | POA: Diagnosis not present

## 2019-02-08 DIAGNOSIS — I130 Hypertensive heart and chronic kidney disease with heart failure and stage 1 through stage 4 chronic kidney disease, or unspecified chronic kidney disease: Secondary | ICD-10-CM | POA: Diagnosis not present

## 2019-02-08 DIAGNOSIS — Z7901 Long term (current) use of anticoagulants: Secondary | ICD-10-CM | POA: Diagnosis not present

## 2019-02-08 DIAGNOSIS — A4181 Sepsis due to Enterococcus: Secondary | ICD-10-CM | POA: Diagnosis present

## 2019-02-08 DIAGNOSIS — B952 Enterococcus as the cause of diseases classified elsewhere: Secondary | ICD-10-CM | POA: Diagnosis present

## 2019-02-08 DIAGNOSIS — Z6834 Body mass index (BMI) 34.0-34.9, adult: Secondary | ICD-10-CM

## 2019-02-08 DIAGNOSIS — Z20828 Contact with and (suspected) exposure to other viral communicable diseases: Secondary | ICD-10-CM | POA: Diagnosis not present

## 2019-02-08 DIAGNOSIS — E1122 Type 2 diabetes mellitus with diabetic chronic kidney disease: Secondary | ICD-10-CM | POA: Diagnosis present

## 2019-02-08 DIAGNOSIS — E1140 Type 2 diabetes mellitus with diabetic neuropathy, unspecified: Secondary | ICD-10-CM | POA: Diagnosis present

## 2019-02-08 DIAGNOSIS — Z515 Encounter for palliative care: Secondary | ICD-10-CM

## 2019-02-08 DIAGNOSIS — M1711 Unilateral primary osteoarthritis, right knee: Secondary | ICD-10-CM | POA: Diagnosis present

## 2019-02-08 DIAGNOSIS — Z89512 Acquired absence of left leg below knee: Secondary | ICD-10-CM

## 2019-02-08 DIAGNOSIS — E785 Hyperlipidemia, unspecified: Secondary | ICD-10-CM | POA: Diagnosis present

## 2019-02-08 DIAGNOSIS — R7881 Bacteremia: Secondary | ICD-10-CM

## 2019-02-08 DIAGNOSIS — I5042 Chronic combined systolic (congestive) and diastolic (congestive) heart failure: Secondary | ICD-10-CM | POA: Diagnosis present

## 2019-02-08 DIAGNOSIS — N179 Acute kidney failure, unspecified: Secondary | ICD-10-CM | POA: Diagnosis present

## 2019-02-08 DIAGNOSIS — I428 Other cardiomyopathies: Secondary | ICD-10-CM

## 2019-02-08 DIAGNOSIS — N183 Chronic kidney disease, stage 3 unspecified: Secondary | ICD-10-CM | POA: Diagnosis present

## 2019-02-08 DIAGNOSIS — C8400 Mycosis fungoides, unspecified site: Secondary | ICD-10-CM | POA: Diagnosis not present

## 2019-02-08 DIAGNOSIS — R0602 Shortness of breath: Secondary | ICD-10-CM | POA: Diagnosis not present

## 2019-02-08 DIAGNOSIS — K219 Gastro-esophageal reflux disease without esophagitis: Secondary | ICD-10-CM | POA: Diagnosis present

## 2019-02-08 DIAGNOSIS — R000 Tachycardia, unspecified: Secondary | ICD-10-CM | POA: Diagnosis not present

## 2019-02-08 DIAGNOSIS — Z66 Do not resuscitate: Secondary | ICD-10-CM | POA: Diagnosis present

## 2019-02-08 DIAGNOSIS — Z8659 Personal history of other mental and behavioral disorders: Secondary | ICD-10-CM

## 2019-02-08 DIAGNOSIS — J189 Pneumonia, unspecified organism: Secondary | ICD-10-CM | POA: Diagnosis not present

## 2019-02-08 DIAGNOSIS — Z8249 Family history of ischemic heart disease and other diseases of the circulatory system: Secondary | ICD-10-CM

## 2019-02-08 DIAGNOSIS — Z7189 Other specified counseling: Secondary | ICD-10-CM

## 2019-02-08 DIAGNOSIS — I2510 Atherosclerotic heart disease of native coronary artery without angina pectoris: Secondary | ICD-10-CM | POA: Diagnosis present

## 2019-02-08 DIAGNOSIS — I10 Essential (primary) hypertension: Secondary | ICD-10-CM | POA: Diagnosis present

## 2019-02-08 DIAGNOSIS — G9341 Metabolic encephalopathy: Secondary | ICD-10-CM | POA: Diagnosis present

## 2019-02-08 DIAGNOSIS — D631 Anemia in chronic kidney disease: Secondary | ICD-10-CM | POA: Diagnosis present

## 2019-02-08 DIAGNOSIS — E860 Dehydration: Secondary | ICD-10-CM | POA: Diagnosis present

## 2019-02-08 DIAGNOSIS — N5089 Other specified disorders of the male genital organs: Secondary | ICD-10-CM | POA: Diagnosis present

## 2019-02-08 DIAGNOSIS — F329 Major depressive disorder, single episode, unspecified: Secondary | ICD-10-CM | POA: Diagnosis present

## 2019-02-08 DIAGNOSIS — R404 Transient alteration of awareness: Secondary | ICD-10-CM | POA: Diagnosis not present

## 2019-02-08 DIAGNOSIS — Z833 Family history of diabetes mellitus: Secondary | ICD-10-CM

## 2019-02-08 DIAGNOSIS — N400 Enlarged prostate without lower urinary tract symptoms: Secondary | ICD-10-CM | POA: Diagnosis present

## 2019-02-08 DIAGNOSIS — G609 Hereditary and idiopathic neuropathy, unspecified: Secondary | ICD-10-CM | POA: Diagnosis present

## 2019-02-08 DIAGNOSIS — R531 Weakness: Secondary | ICD-10-CM | POA: Diagnosis not present

## 2019-02-08 DIAGNOSIS — L509 Urticaria, unspecified: Secondary | ICD-10-CM | POA: Diagnosis not present

## 2019-02-08 DIAGNOSIS — Z95810 Presence of automatic (implantable) cardiac defibrillator: Secondary | ICD-10-CM | POA: Diagnosis present

## 2019-02-08 DIAGNOSIS — N136 Pyonephrosis: Secondary | ICD-10-CM | POA: Diagnosis present

## 2019-02-08 DIAGNOSIS — Z794 Long term (current) use of insulin: Secondary | ICD-10-CM

## 2019-02-08 DIAGNOSIS — N1330 Unspecified hydronephrosis: Secondary | ICD-10-CM | POA: Diagnosis not present

## 2019-02-08 DIAGNOSIS — F339 Major depressive disorder, recurrent, unspecified: Secondary | ICD-10-CM | POA: Diagnosis present

## 2019-02-08 DIAGNOSIS — Z888 Allergy status to other drugs, medicaments and biological substances status: Secondary | ICD-10-CM

## 2019-02-08 DIAGNOSIS — R609 Edema, unspecified: Secondary | ICD-10-CM | POA: Diagnosis not present

## 2019-02-08 DIAGNOSIS — M109 Gout, unspecified: Secondary | ICD-10-CM | POA: Diagnosis present

## 2019-02-08 DIAGNOSIS — Z9861 Coronary angioplasty status: Secondary | ICD-10-CM

## 2019-02-08 LAB — COMPREHENSIVE METABOLIC PANEL
ALT: 19 U/L (ref 0–44)
AST: 32 U/L (ref 15–41)
Albumin: 3.3 g/dL — ABNORMAL LOW (ref 3.5–5.0)
Alkaline Phosphatase: 102 U/L (ref 38–126)
Anion gap: 18 — ABNORMAL HIGH (ref 5–15)
BUN: 35 mg/dL — ABNORMAL HIGH (ref 8–23)
CO2: 24 mmol/L (ref 22–32)
Calcium: 13.6 mg/dL (ref 8.9–10.3)
Chloride: 92 mmol/L — ABNORMAL LOW (ref 98–111)
Creatinine, Ser: 2.93 mg/dL — ABNORMAL HIGH (ref 0.61–1.24)
GFR calc Af Amer: 24 mL/min — ABNORMAL LOW (ref >60)
GFR calc non Af Amer: 21 mL/min — ABNORMAL LOW (ref >60)
Glucose, Bld: 102 mg/dL — ABNORMAL HIGH (ref 70–99)
Potassium: 4.5 mmol/L (ref 3.5–5.1)
Sodium: 134 mmol/L — ABNORMAL LOW (ref 135–145)
Total Bilirubin: 0.5 mg/dL (ref 0.3–1.2)
Total Protein: 6.2 g/dL — ABNORMAL LOW (ref 6.5–8.1)

## 2019-02-08 LAB — URINALYSIS, ROUTINE W REFLEX MICROSCOPIC
Bilirubin Urine: NEGATIVE
Glucose, UA: NEGATIVE mg/dL
Ketones, ur: NEGATIVE mg/dL
Leukocytes,Ua: NEGATIVE
Nitrite: NEGATIVE
Protein, ur: NEGATIVE mg/dL
Specific Gravity, Urine: <1.005 — ABNORMAL LOW (ref 1.005–1.030)
pH: 5.5 (ref 5.0–8.0)

## 2019-02-08 LAB — CBC WITH DIFFERENTIAL/PLATELET
Basophils Absolute: 0.1 10*3/uL (ref 0.0–0.1)
Basophils Relative: 1 %
Eosinophils Absolute: 4.5 10*3/uL — ABNORMAL HIGH (ref 0.0–0.5)
Eosinophils Relative: 52 %
HCT: 28.8 % — ABNORMAL LOW (ref 39.0–52.0)
Hemoglobin: 9.6 g/dL — ABNORMAL LOW (ref 13.0–17.0)
Lymphocytes Relative: 14 %
Lymphs Abs: 1.2 10*3/uL (ref 0.7–4.0)
MCH: 28.5 pg (ref 26.0–34.0)
MCHC: 33.3 g/dL (ref 30.0–36.0)
MCV: 85.5 fL (ref 80.0–100.0)
Monocytes Absolute: 1.4 10*3/uL — ABNORMAL HIGH (ref 0.1–1.0)
Monocytes Relative: 17 %
Neutro Abs: 1.3 10*3/uL — ABNORMAL LOW (ref 1.7–7.7)
Neutrophils Relative %: 15 %
Platelets: 280 10*3/uL (ref 150–400)
RBC: 3.37 MIL/uL — ABNORMAL LOW (ref 4.22–5.81)
RDW: 16.4 % — ABNORMAL HIGH (ref 11.5–15.5)
WBC: 8.6 10*3/uL (ref 4.0–10.5)
nRBC: 0 % (ref 0.0–0.2)
nRBC: 0 /100 WBC

## 2019-02-08 LAB — URINALYSIS, MICROSCOPIC (REFLEX): Bacteria, UA: NONE SEEN

## 2019-02-08 LAB — LACTIC ACID, PLASMA
Lactic Acid, Venous: 6.1 mmol/L (ref 0.5–1.9)
Lactic Acid, Venous: 5.7 mmol/L (ref 0.5–1.9)

## 2019-02-08 LAB — BRAIN NATRIURETIC PEPTIDE: B Natriuretic Peptide: 33.5 pg/mL (ref 0.0–100.0)

## 2019-02-08 LAB — SARS CORONAVIRUS 2 BY RT PCR (HOSPITAL ORDER, PERFORMED IN ~~LOC~~ HOSPITAL LAB): SARS Coronavirus 2: NEGATIVE

## 2019-02-08 LAB — GLUCOSE, CAPILLARY: Glucose-Capillary: 98 mg/dL (ref 70–99)

## 2019-02-08 MED ORDER — FENTANYL CITRATE (PF) 100 MCG/2ML IJ SOLN
50.0000 ug | Freq: Once | INTRAMUSCULAR | Status: AC
  Administered 2019-02-08: 50 ug via INTRAVENOUS
  Filled 2019-02-08: qty 2

## 2019-02-08 MED ORDER — ACETAMINOPHEN 650 MG RE SUPP: 650.0000 mg | Freq: Four times a day (QID) | RECTAL | Status: DC | PRN

## 2019-02-08 MED ORDER — INSULIN ASPART 100 UNIT/ML ~~LOC~~ SOLN: 0.0000 [IU] | Freq: Three times a day (TID) | SUBCUTANEOUS | Status: DC

## 2019-02-08 MED ORDER — SODIUM CHLORIDE 0.9 % IV SOLN
2.0000 g | INTRAVENOUS | Status: DC
  Administered 2019-02-09: 2 g via INTRAVENOUS
  Filled 2019-02-08 (×2): qty 2

## 2019-02-08 MED ORDER — VANCOMYCIN HCL IN DEXTROSE 1-5 GM/200ML-% IV SOLN: 1000.0000 mg | Freq: Once | INTRAVENOUS | Status: DC

## 2019-02-08 MED ORDER — SODIUM CHLORIDE 0.9 % IV SOLN
2.0000 g | Freq: Once | INTRAVENOUS | Status: AC
  Administered 2019-02-08: 2 g via INTRAVENOUS
  Filled 2019-02-08: qty 2

## 2019-02-08 MED ORDER — HYDROCODONE-ACETAMINOPHEN 5-325 MG PO TABS
2.0000 | ORAL_TABLET | Freq: Once | ORAL | Status: AC
  Administered 2019-02-08: 2 via ORAL
  Filled 2019-02-08: qty 2

## 2019-02-08 MED ORDER — ENSURE ENLIVE PO LIQD
237.0000 mL | Freq: Three times a day (TID) | ORAL | Status: DC
  Administered 2019-02-09 – 2019-02-13 (×12): 237 mL via ORAL
  Filled 2019-02-08 (×15): qty 237

## 2019-02-08 MED ORDER — ONDANSETRON HCL 4 MG PO TABS: 4.0000 mg | ORAL_TABLET | Freq: Four times a day (QID) | ORAL | Status: DC | PRN

## 2019-02-08 MED ORDER — LACTATED RINGERS IV BOLUS
1000.0000 mL | Freq: Once | INTRAVENOUS | Status: AC
  Administered 2019-02-08: 1000 mL via INTRAVENOUS

## 2019-02-08 MED ORDER — METRONIDAZOLE IN NACL 5-0.79 MG/ML-% IV SOLN
500.0000 mg | Freq: Once | INTRAVENOUS | Status: AC
  Administered 2019-02-08: 500 mg via INTRAVENOUS
  Filled 2019-02-08: qty 100

## 2019-02-08 MED ORDER — ACETAMINOPHEN 500 MG PO TABS: 500.0000 mg | ORAL_TABLET | Freq: Three times a day (TID) | ORAL | Status: DC | PRN

## 2019-02-08 MED ORDER — PANTOPRAZOLE SODIUM 40 MG PO TBEC
40.0000 mg | DELAYED_RELEASE_TABLET | Freq: Every day | ORAL | Status: DC
  Administered 2019-02-09 – 2019-02-13 (×5): 40 mg via ORAL
  Filled 2019-02-08 (×5): qty 1

## 2019-02-08 MED ORDER — FUROSEMIDE 10 MG/ML IJ SOLN: 20.0000 mg | Freq: Once | INTRAMUSCULAR | Status: DC

## 2019-02-08 MED ORDER — ACETAMINOPHEN 325 MG PO TABS
650.0000 mg | ORAL_TABLET | Freq: Four times a day (QID) | ORAL | Status: DC | PRN
  Administered 2019-02-09 – 2019-02-10 (×4): 650 mg via ORAL
  Filled 2019-02-08 (×5): qty 2

## 2019-02-08 MED ORDER — SODIUM CHLORIDE 0.9 % IV SOLN
INTRAVENOUS | Status: DC
  Administered 2019-02-08 – 2019-02-09 (×4): via INTRAVENOUS
  Administered 2019-02-10: 1000 mL via INTRAVENOUS
  Administered 2019-02-10 – 2019-02-11 (×3): via INTRAVENOUS

## 2019-02-08 MED ORDER — HEPARIN SODIUM (PORCINE) 5000 UNIT/ML IJ SOLN
5000.0000 [IU] | Freq: Three times a day (TID) | INTRAMUSCULAR | Status: DC
  Administered 2019-02-08: 5000 [IU] via SUBCUTANEOUS
  Filled 2019-02-08 (×2): qty 1

## 2019-02-08 MED ORDER — INSULIN ASPART 100 UNIT/ML ~~LOC~~ SOLN: 0.0000 [IU] | Freq: Every day | SUBCUTANEOUS | Status: DC

## 2019-02-08 MED ORDER — ALBUTEROL SULFATE (2.5 MG/3ML) 0.083% IN NEBU
2.5000 mg | INHALATION_SOLUTION | Freq: Two times a day (BID) | RESPIRATORY_TRACT | Status: DC | PRN
  Administered 2019-02-10: 2.5 mg via RESPIRATORY_TRACT
  Filled 2019-02-08: qty 3

## 2019-02-08 MED ORDER — FUROSEMIDE 10 MG/ML IJ SOLN
20.0000 mg | Freq: Once | INTRAMUSCULAR | Status: AC
  Administered 2019-02-08: 20 mg via INTRAVENOUS
  Filled 2019-02-08: qty 2

## 2019-02-08 MED ORDER — PREDNISOLONE ACETATE 1 % OP SUSP
1.0000 [drp] | Freq: Every day | OPHTHALMIC | Status: DC
  Administered 2019-02-09 – 2019-02-13 (×5): 1 [drp] via OPHTHALMIC
  Filled 2019-02-08 (×2): qty 5

## 2019-02-08 MED ORDER — DOCUSATE SODIUM 100 MG PO CAPS
100.0000 mg | ORAL_CAPSULE | Freq: Two times a day (BID) | ORAL | Status: DC
  Administered 2019-02-08 – 2019-02-13 (×9): 100 mg via ORAL
  Filled 2019-02-08 (×10): qty 1

## 2019-02-08 MED ORDER — ONDANSETRON HCL 4 MG/2ML IJ SOLN: 4.0000 mg | Freq: Four times a day (QID) | INTRAMUSCULAR | Status: DC | PRN

## 2019-02-08 NOTE — Progress Notes (Signed)
Pharmacy Antibiotic Note  Eric Price is a 68 y.o. male admitted on 02/08/2019 with concerns for sepsis from unknown source.  Pharmacy has been consulted for daptomycin/cefepime dosing. He has a history of Enterococcus faecalis bacteremia, which was treated with IV ampicillin and ceftriaxone, transitioned to daptomycin 800 mg IV daily for 6 weeks of therapy (possible stop date of 6/2) with plans for indefinite suppression with amoxicillin 500 mg TID.   Patient confirms he has not received a dose of daptomycin today. LA 6.1, WBC 8.6, Scr up to 2.93 (baseline 1-1.6).  Plan: Will adjust daptomycin to 800 mg IV q48 hr given reduced renal function- next dose due 5/7 PM. Will need ID consult prior to order. Cefepime 2g IV q24h Monitor clinical status, cultures, length of therapy and renal function   No data recorded.  Recent Labs  Lab 02/08/19 1615  WBC 8.6  CREATININE 2.93*  LATICACIDVEN 6.1*    Estimated Creatinine Clearance: 27 mL/min (A) (by C-G formula based on SCr of 2.93 mg/dL (H)).    Allergies  Allergen Reactions  . Morphine Shortness Of Breath and Anaphylaxis  . Morphine And Related     "took my breath away"  . Oxycodone     Pt stated, "It makes me climbs walls; fight wars"  . Robaxin [Methocarbamol] Other (See Comments)    Antimicrobials this admission: Cefepime 5/6 >> Daptomycin 4/24 >> (6/2) Ampicillin 4/21 >> 4/24 Ceftriaxone 4/21  >> 4/24  Dose adjustments this admission:   Microbiology results: 5/6 BCx: 5/6 COVID:  5/6 UCx:  Thank you for allowing pharmacy to be a part of this patient's care.  Claiborne Billings, PharmD PGY2 Cardiology Pharmacy Resident Please check AMION for all Pharmacist numbers by unit 02/08/2019 5:29 PM

## 2019-02-08 NOTE — Progress Notes (Signed)
Patient was off of unit from 1901 until 2048 at [place]. Attempted to apply DNR bracelent to patient.  Patient refused DNR bracelet, stating "I want you to at least try CPR for 10-15 minutes, don't just give up." MD has been contacted regarding patient's comment.

## 2019-02-08 NOTE — Telephone Encounter (Signed)
OK to go over those with him.  He is getting infusion therapy for anti-biotic but unfortunately is very terminal.  They had to d/c Hospice to do the infusion for the antibiotics.  If his condition is worsening, they will need to decide whether to get back Hospice involved again.

## 2019-02-08 NOTE — Plan of Care (Signed)
  Problem: Education: Goal: Knowledge of General Education information will improve Description Including pain rating scale, medication(s)/side effects and non-pharmacologic comfort measures Outcome: Progressing   Problem: Health Behavior/Discharge Planning: Goal: Ability to manage health-related needs will improve Outcome: Progressing   Problem: Nutrition: Goal: Adequate nutrition will be maintained Outcome: Not Progressing Note:  Patient is currently NPO

## 2019-02-08 NOTE — Telephone Encounter (Signed)
Called patient and NOT able to LMOVM to return call due to a full mailbox.  [place] for [facility] to Discuss results / PCP / recommendations / Schedule patient  Per Dr. Elease Hashimoto: OK to go over those with him.  He is getting infusion therapy for anti-biotic but unfortunately is very terminal.  They had to d/c Hospice to do the infusion for the antibiotics.  If his condition is worsening, they will need to decide whether to get back Hospice involved again.    CRM Created.

## 2019-02-08 NOTE — ED Notes (Signed)
Admitting at bedside 

## 2019-02-08 NOTE — ED Notes (Signed)
Dr. Gilford Raid aware of calcium level.
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
## 2019-02-08 NOTE — ED Notes (Signed)
Dr. Gilford Raid notified of lactic acid.
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
## 2019-02-08 NOTE — Telephone Encounter (Signed)
Copied from [place] [id number]. Topic: General - Other >> Feb 07, 2019  4:45 PM Valla Leaver wrote: Reason for CRM: Wife, Cristine, calling requesting potassium and magnesium results from 01/27/2019. Patient is very lethargic.

## 2019-02-08 NOTE — Telephone Encounter (Signed)
These labs are from a hospital encounter. OK to verbally tell her results? I will offer to set up a Doxy visit?

## 2019-02-08 NOTE — ED Triage Notes (Addendum)
Per [facility]: pt coming in for generalized weakness and lethargy. Pt has been alert and oriented X 4 with EMS. Wife wonders if pt is allergic to an antibiotic he is taking. Pt has poor urine output with foul odor. Pt has pitting edema in leg and abdomen. Pt has PICC line. Pt has amputation to left lower leg. Pt hasn't taken lasix in "a long time", pt is not supposed to take his lasix if he is under 200 lbs.
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
## 2019-02-09 ENCOUNTER — Other Ambulatory Visit: Payer: Self-pay

## 2019-02-09 ENCOUNTER — Inpatient Hospital Stay (HOSPITAL_COMMUNITY): Payer: Medicare HMO

## 2019-02-09 DIAGNOSIS — I5042 Chronic combined systolic (congestive) and diastolic (congestive) heart failure: Secondary | ICD-10-CM

## 2019-02-09 DIAGNOSIS — B952 Enterococcus as the cause of diseases classified elsewhere: Secondary | ICD-10-CM

## 2019-02-09 DIAGNOSIS — A419 Sepsis, unspecified organism: Secondary | ICD-10-CM | POA: Diagnosis not present

## 2019-02-09 DIAGNOSIS — J189 Pneumonia, unspecified organism: Secondary | ICD-10-CM | POA: Diagnosis not present

## 2019-02-09 DIAGNOSIS — Z515 Encounter for palliative care: Secondary | ICD-10-CM

## 2019-02-09 DIAGNOSIS — Z7189 Other specified counseling: Secondary | ICD-10-CM

## 2019-02-09 DIAGNOSIS — R7881 Bacteremia: Secondary | ICD-10-CM

## 2019-02-09 DIAGNOSIS — C8400 Mycosis fungoides, unspecified site: Secondary | ICD-10-CM

## 2019-02-09 DIAGNOSIS — R609 Edema, unspecified: Secondary | ICD-10-CM

## 2019-02-09 LAB — PATHOLOGIST SMEAR REVIEW

## 2019-02-09 LAB — COMPREHENSIVE METABOLIC PANEL
ALT: 18 U/L (ref 0–44)
ALT: 16 U/L (ref 0–44)
AST: 31 U/L (ref 15–41)
AST: 30 U/L (ref 15–41)
Albumin: 3.1 g/dL — ABNORMAL LOW (ref 3.5–5.0)
Albumin: 2.9 g/dL — ABNORMAL LOW (ref 3.5–5.0)
Alkaline Phosphatase: 95 U/L (ref 38–126)
Alkaline Phosphatase: 89 U/L (ref 38–126)
Anion gap: 13 (ref 5–15)
Anion gap: 13 (ref 5–15)
BUN: 33 mg/dL — ABNORMAL HIGH (ref 8–23)
BUN: 32 mg/dL — ABNORMAL HIGH (ref 8–23)
CO2: 25 mmol/L (ref 22–32)
CO2: 24 mmol/L (ref 22–32)
Calcium: 13 mg/dL — ABNORMAL HIGH (ref 8.9–10.3)
Calcium: 11.7 mg/dL — ABNORMAL HIGH (ref 8.9–10.3)
Chloride: 97 mmol/L — ABNORMAL LOW (ref 98–111)
Chloride: 99 mmol/L (ref 98–111)
Creatinine, Ser: 2.75 mg/dL — ABNORMAL HIGH (ref 0.61–1.24)
Creatinine, Ser: 2.57 mg/dL — ABNORMAL HIGH (ref 0.61–1.24)
GFR calc Af Amer: 26 mL/min — ABNORMAL LOW (ref >60)
GFR calc Af Amer: 29 mL/min — ABNORMAL LOW (ref >60)
GFR calc non Af Amer: 23 mL/min — ABNORMAL LOW (ref >60)
GFR calc non Af Amer: 25 mL/min — ABNORMAL LOW (ref >60)
Glucose, Bld: 101 mg/dL — ABNORMAL HIGH (ref 70–99)
Glucose, Bld: 80 mg/dL (ref 70–99)
Potassium: 4.2 mmol/L (ref 3.5–5.1)
Potassium: 3.7 mmol/L (ref 3.5–5.1)
Sodium: 135 mmol/L (ref 135–145)
Sodium: 136 mmol/L (ref 135–145)
Total Bilirubin: 0.7 mg/dL (ref 0.3–1.2)
Total Bilirubin: 0.4 mg/dL (ref 0.3–1.2)
Total Protein: 6 g/dL — ABNORMAL LOW (ref 6.5–8.1)
Total Protein: 5.4 g/dL — ABNORMAL LOW (ref 6.5–8.1)

## 2019-02-09 LAB — CBC
HCT: 26.7 % — ABNORMAL LOW (ref 39.0–52.0)
Hemoglobin: 8.6 g/dL — ABNORMAL LOW (ref 13.0–17.0)
MCH: 27.2 pg (ref 26.0–34.0)
MCHC: 32.2 g/dL (ref 30.0–36.0)
MCV: 84.5 fL (ref 80.0–100.0)
Platelets: 232 10*3/uL (ref 150–400)
RBC: 3.16 MIL/uL — ABNORMAL LOW (ref 4.22–5.81)
RDW: 16.4 % — ABNORMAL HIGH (ref 11.5–15.5)
WBC: 7.7 10*3/uL (ref 4.0–10.5)
nRBC: 0 % (ref 0.0–0.2)

## 2019-02-09 LAB — BASIC METABOLIC PANEL
Anion gap: 14 (ref 5–15)
BUN: 34 mg/dL — ABNORMAL HIGH (ref 8–23)
CO2: 26 mmol/L (ref 22–32)
Calcium: 13.1 mg/dL (ref 8.9–10.3)
Chloride: 95 mmol/L — ABNORMAL LOW (ref 98–111)
Creatinine, Ser: 2.94 mg/dL — ABNORMAL HIGH (ref 0.61–1.24)
GFR calc Af Amer: 24 mL/min — ABNORMAL LOW (ref >60)
GFR calc non Af Amer: 21 mL/min — ABNORMAL LOW (ref >60)
Glucose, Bld: 119 mg/dL — ABNORMAL HIGH (ref 70–99)
Potassium: 4 mmol/L (ref 3.5–5.1)
Sodium: 135 mmol/L (ref 135–145)

## 2019-02-09 LAB — URINE CULTURE: Culture: NO GROWTH

## 2019-02-09 LAB — GLUCOSE, CAPILLARY
Glucose-Capillary: 76 mg/dL (ref 70–99)
Glucose-Capillary: 80 mg/dL (ref 70–99)
Glucose-Capillary: 72 mg/dL (ref 70–99)
Glucose-Capillary: 123 mg/dL — ABNORMAL HIGH (ref 70–99)

## 2019-02-09 LAB — CALCIUM, IONIZED: Calcium, Ionized, Serum: 7 mg/dL — ABNORMAL HIGH (ref 4.5–5.6)

## 2019-02-09 LAB — LACTIC ACID, PLASMA: Lactic Acid, Venous: 3.3 mmol/L (ref 0.5–1.9)

## 2019-02-09 LAB — CK: Total CK: 268 U/L (ref 49–397)

## 2019-02-09 LAB — MAGNESIUM: Magnesium: 1.8 mg/dL (ref 1.7–2.4)

## 2019-02-09 LAB — PROCALCITONIN: Procalcitonin: 0.25 ng/mL

## 2019-02-09 LAB — APTT: aPTT: 73 seconds — ABNORMAL HIGH (ref 24–36)

## 2019-02-09 MED ORDER — FUROSEMIDE 10 MG/ML IJ SOLN
60.0000 mg | Freq: Once | INTRAMUSCULAR | Status: AC
  Administered 2019-02-09: 60 mg via INTRAVENOUS
  Filled 2019-02-09: qty 6

## 2019-02-09 MED ORDER — FUROSEMIDE 10 MG/ML IJ SOLN
40.0000 mg | Freq: Once | INTRAMUSCULAR | Status: AC
  Administered 2019-02-09: 40 mg via INTRAVENOUS
  Filled 2019-02-09: qty 4

## 2019-02-09 MED ORDER — HEPARIN (PORCINE) 25000 UT/250ML-% IV SOLN
1400.0000 [IU]/h | INTRAVENOUS | Status: DC
  Administered 2019-02-09 – 2019-02-11 (×3): 1400 [IU]/h via INTRAVENOUS
  Filled 2019-02-09 (×3): qty 250

## 2019-02-09 MED ORDER — LEVALBUTEROL HCL 0.63 MG/3ML IN NEBU
0.6300 mg | INHALATION_SOLUTION | Freq: Three times a day (TID) | RESPIRATORY_TRACT | Status: DC
  Administered 2019-02-09 (×2): 0.63 mg via RESPIRATORY_TRACT
  Filled 2019-02-09 (×2): qty 3

## 2019-02-09 MED ORDER — SODIUM CHLORIDE 0.9 % IV SOLN
800.0000 mg | Freq: Every day | INTRAVENOUS | Status: DC
  Administered 2019-02-09: 800 mg via INTRAVENOUS
  Filled 2019-02-09: qty 16

## 2019-02-09 MED ORDER — SODIUM CHLORIDE 0.9 % IV SOLN
INTRAVENOUS | Status: DC | PRN
  Administered 2019-02-09: 17:00:00 500 mL via INTRAVENOUS

## 2019-02-09 MED ORDER — ADULT MULTIVITAMIN W/MINERALS CH
1.0000 | ORAL_TABLET | Freq: Every day | ORAL | Status: DC
  Administered 2019-02-09 – 2019-02-13 (×5): 1 via ORAL
  Filled 2019-02-09 (×5): qty 1

## 2019-02-09 MED ORDER — CARVEDILOL 6.25 MG PO TABS
6.2500 mg | ORAL_TABLET | Freq: Two times a day (BID) | ORAL | Status: DC
  Administered 2019-02-09 – 2019-02-11 (×4): 6.25 mg via ORAL
  Filled 2019-02-09 (×4): qty 1

## 2019-02-09 MED ORDER — LEVALBUTEROL HCL 0.63 MG/3ML IN NEBU
0.6300 mg | INHALATION_SOLUTION | Freq: Two times a day (BID) | RESPIRATORY_TRACT | Status: DC
  Administered 2019-02-10 – 2019-02-11 (×4): 0.63 mg via RESPIRATORY_TRACT
  Filled 2019-02-09 (×4): qty 3

## 2019-02-09 MED ORDER — SODIUM CHLORIDE 0.9% FLUSH
10.0000 mL | Freq: Two times a day (BID) | INTRAVENOUS | Status: DC
  Administered 2019-02-10 – 2019-02-13 (×6): 10 mL
  Administered 2019-02-13: 20 mL
  Administered 2019-02-14: 10 mL

## 2019-02-09 NOTE — Progress Notes (Signed)
[facility] Progress Note  Patient: Eric Price TDD:[id number]   PCP: [name], MD DOB: [birth year]   DOA: 02/08/2019   DOS: 02/09/2019   Date of Service: the patient was seen and examined on 02/09/2019  Brief hospital course: Pt. with PMH of CAD s/p PCI, nonischemic cardiomyopathy, ICD placement, HTN, HLD, BPH, T2DM, gout, arterial thrombus s/p left BKA, HFpEF EF 40-45%, mycosis fungoides, and chronic anticoagulation ; admitted on 02/08/2019, presented with complaint of fatigue, was found to have hypercalcemia acute kidney injury. Currently further plan is continue current.  Subjective: Reporting somewhat improvement.  Although appears more short of breath.  Also has scrotal swelling.  Assessment and Plan:  Sepsis ([place]) Unknown etiology likely from UTI. Enterococcus bacteremia Follow-up on blood cultures and urine cultures. Receiving IV fluid. Patient was already on IV daptomycin which she has been continuing without any missed. We will continue daptomycin for now. Add cefepime.  Severe hypercalcemia. Acute kidney injury on chronic kidney disease stage III. Acute metabolic encephalopathy Unresponsive episode at home. Comes in with confusion. We will check CT head. Renal function from baseline 1.6 to currently 2.49. Serum calcium also increased.  Corrected serum calcium is 14.2. While the patient has sepsis also appears mildly volume overloaded. We will continue with IV fluids.    Continue low-dose Lasix. Holding off on Foley catheter for now If renal function improves or calcium does not improve IV zoledronic acid x1 followed by subcutaneous calcitonin every 12 hours. May require nephrology input if does not improve rapidly.    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 Palliative care consult for goals of care. Patient currently wants to treat for what is  treatable but wants to remain DNR.    History of depression Holding trazodone.    HTN (hypertension) Blood pressure marginally low.  Currently holding blood pressure medication.    HLD (hyperlipidemia) Holding Lipitor for now.    BPH (benign prostatic hyperplasia) Foley catheter insertion.    Controlled type 2 diabetes mellitus with stage 3 chronic kidney disease, with long-term current use of insulin (HCC) Holding home insulin regimen currently on sliding scale insulin.    Nonischemic cardiomyopathy ([place])   ICD (implantable cardioverter-defibrillator) in place   Chronic combined systolic and diastolic CHF (congestive heart failure) ([place]) Consider the patient can get volume overloaded. We will give low-dose Lasix. Monitor for now.    S/P BKA (below knee amputation), left ([place]) PT OT consulted.  Diet: Cardiac diet, speech consult DVT Prophylaxis: on therapeutic anticoagulation.  Advance goals of care discussion: Full code  Family Communication: no family was present at bedside, at the time of interview.  Opportunity was given to ask question and all questions were answered satisfactorily.   Disposition:  Discharge to pending.  Consultants: Palliative care Procedures: None  Scheduled Meds:  carvedilol  6.25 mg Oral BID WC   docusate sodium  100 mg Oral BID   feeding supplement (ENSURE ENLIVE)  237 mL Oral TID BM   insulin aspart  0-5 Units Subcutaneous QHS   insulin aspart  0-9 Units Subcutaneous TID WC   levalbuterol  0.63 mg Nebulization Q8H   multivitamin with minerals  1 tablet Oral Daily   pantoprazole  40 mg Oral Daily   prednisoLONE acetate  1 drop Both Eyes Daily   sodium chloride flush  10-40 mL Intracatheter Q12H   Continuous Infusions:  sodium chloride 125 mL/hr at 02/09/19  1800   sodium chloride 10 mL/hr at 02/09/19 1800   ceFEPime (MAXIPIME) IV Stopped (02/09/19 1720)   DAPTOmycin (CUBICIN)  IV     heparin 1,400 Units/hr  (02/09/19 1800)   PRN Meds: sodium chloride, acetaminophen **OR** acetaminophen, albuterol, ondansetron **OR** ondansetron (ZOFRAN) IV Antibiotics: Anti-infectives (From admission, onward)   Start     Dose/Rate Route Frequency Ordered Stop   02/09/19 2000  DAPTOmycin (CUBICIN) 800 mg in sodium chloride 0.9 % IVPB     800 mg 232 mL/hr over 30 Minutes Intravenous Daily 02/09/19 0936     02/09/19 1800  ceFEPIme (MAXIPIME) 2 g in sodium chloride 0.9 % 100 mL IVPB     2 g 200 mL/hr over 30 Minutes Intravenous Every 24 hours 02/08/19 1733     02/08/19 1615  ceFEPIme (MAXIPIME) 2 g in sodium chloride 0.9 % 100 mL IVPB     2 g 200 mL/hr over 30 Minutes Intravenous  Once 02/08/19 1606 02/08/19 1722   02/08/19 1615  metroNIDAZOLE (FLAGYL) IVPB 500 mg     500 mg 100 mL/hr over 60 Minutes Intravenous  Once 02/08/19 1606 02/08/19 1809   02/08/19 1615  vancomycin (VANCOCIN) IVPB 1000 mg/200 mL premix  Status:  Discontinued     1,000 mg 200 mL/hr over 60 Minutes Intravenous  Once 02/08/19 1606 02/08/19 1623       Objective: Physical Exam: Vitals:   02/09/19 0514 02/09/19 1239 02/09/19 1244 02/09/19 1636  BP: 120/68 115/69    Pulse: (!) 104 (!) 113    Resp: 15     Temp: 98.3 F (36.8 C)     TempSrc: Oral     SpO2: 100% 99%  92%  Weight:      Height:   5\' 8"  (1.727 m)     Intake/Output Summary (Last 24 hours) at 02/09/2019 1936 Last data filed at 02/09/2019 1800 Gross per 24 hour  Intake 2894.5 ml  Output 1720 ml  Net 1174.5 ml   Filed Weights   02/08/19 1706 02/09/19 0500  Weight: 95.3 kg 99 kg   General: Alert, Awake and Oriented to Time, Place and Person. Appear in moderate distress, affect appropriate Eyes: PERRL, Conjunctiva normal ENT: Oral Mucosa clear moist. Neck: difficult to assess  JVD, no Abnormal Mass Or lumps Cardiovascular: S1 and S2 Present, no Murmur, Peripheral Pulses Present Respiratory: normal respiratory effort, Bilateral Air entry equal and Decreased, no use of  accessory muscle, bilateral  Crackles, no wheezes Abdomen: Bowel Sound present, Soft and no tenderness, no hernia Skin: no redness, no Rash, no induration Extremities: left BKA right Pedal edema, no calf tenderness Neurologic: Grossly no focal neuro deficit. Bilaterally Equal motor strength  Data Reviewed: CBC: Recent Labs  Lab 02/08/19 1615 02/09/19 0333  WBC 8.6 7.7  NEUTROABS 1.3*  --   HGB 9.6* 8.6*  HCT 28.8* 26.7*  MCV 85.5 84.5  PLT 280 902   Basic Metabolic Panel: Recent Labs  Lab 02/08/19 1615 02/08/19 2302 02/09/19 0333  NA 134* 135 136  K 4.5 4.2 3.7  CL 92* 97* 99  CO2 24 25 24   GLUCOSE 102* 101* 80  BUN 35* 33* 32*  CREATININE 2.93* 2.75* 2.57*  CALCIUM 13.6* 13.0* 11.7*  MG  --  1.8  --     Liver Function Tests: Recent Labs  Lab 02/08/19 1615 02/08/19 2302 02/09/19 0333  AST 32 31 30  ALT 19 18 16   ALKPHOS 102 95 89  BILITOT 0.5 0.7 0.4  PROT 6.2* 6.0* 5.4*  ALBUMIN 3.3* 3.1* 2.9*   No results for input(s): LIPASE, AMYLASE in the last 168 hours. No results for input(s): AMMONIA in the last 168 hours. Coagulation Profile: No results for input(s): INR, PROTIME in the last 168 hours. Cardiac Enzymes: Recent Labs  Lab 02/08/19 2302  CKTOTAL 268   BNP (last 3 results) Recent Labs    06/15/18 1644  PROBNP 17.0   CBG: Recent Labs  Lab 02/08/19 2057 02/09/19 0747 02/09/19 1215 02/09/19 1632  GLUCAP 98 76 80 72   Studies: Ct Head Wo Contrast  Result Date: 02/08/2019 CLINICAL DATA:  Altered level of consciousness EXAM: CT HEAD WITHOUT CONTRAST TECHNIQUE: Contiguous axial images were obtained from the base of the skull through the vertex without intravenous contrast. COMPARISON:  03/08/2018 FINDINGS: Brain: Mild atrophic changes are noted. No findings to suggest acute hemorrhage, acute infarction or space-occupying mass lesion are noted. Vascular: No hyperdense vessel or unexpected calcification. Skull: Normal. Negative for fracture or  focal lesion. Sinuses/Orbits: No acute finding. Other: There is a 2 cm focal soft tissue lesion identified within the anterior aspect of the left parotid gland. This was not well visualized on the prior exam IMPRESSION: 1. Atrophic changes without acute abnormality. 2. 2 cm soft tissue lesion within the left parotid gland. 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 Differential of lymphoma or lymphadenopathy is less likely. MRI of the neck with and without contrast material is recommended for further follow-up. Electronically Signed   By: Inez Catalina M.D.   On: 02/08/2019 20:25   US Renal  Result Date: 02/09/2019 CLINICAL DATA:  Acute kidney injury.  Inpatient. EXAM: RENAL / URINARY TRACT ULTRASOUND COMPLETE COMPARISON:  08/24/2018 renal sonogram. FINDINGS: Right Kidney: Renal measurements: 9.5 x 4.3 x 4.0 cm = volume: 87 mL . Echogenicity within normal limits. No mass or hydronephrosis visualized. Lower pole of the right kidney poorly visualized due to overlying bowel gas. Left Kidney: Renal measurements: 11.9 x 4.9 x 5.7 cm = volume: 174 mL. Normal renal parenchymal echogenicity and thickness. Mild-to-moderate left hydronephrosis, new. Exophytic hypoechoic 1.6 x 1.6 cm upper left renal cortical lesion, not previously described. Bladder: Bladder not significantly distended. Suggestion of diffuse bladder wall thickening. No focal bladder abnormality demonstrated. IMPRESSION: 1. Mild-to-moderate left hydronephrosis, new since 08/24/2018 renal sonogram. Acute left ureteral obstruction not excluded. 2. No right hydronephrosis. 3. Indeterminate exophytic hypoechoic 1.6 cm upper left renal cortical lesion, not previously visualized, left renal neoplasm not excluded. When clinically feasible, renal mass protocol MRI (preferred) or CT abdomen without and with IV contrast recommended for further characterization. 4.  Suggestion of nonspecific diffuse bladder wall thickening, although evaluation is limited by lack of bladder distention. Consider correlation with urinalysis. Electronically Signed   By: Ilona Sorrel M.D.   On: 02/09/2019 08:01   Dg Chest Port 1 View  Result Date: 02/09/2019 CLINICAL DATA:  SOB EXAM: PORTABLE CHEST 1 VIEW COMPARISON:  Radiograph 02/08/2019 FINDINGS: LEFT-sided pacemaker overlies normal cardiac silhouette. No effusion, infiltrate pneumothorax. RIGHT PICC line noted with tip in distal SVC. IMPRESSION: No acute cardiopulmonary process. Electronically Signed   By: Suzy Bouchard M.D.   On: 02/09/2019 17:28   Vas [place] Lower Extremity Venous (dvt)  Result Date: 02/09/2019  Lower Venous Study Indications: Edema.  Limitations: Body habitus. Performing Technologist: Abram Sander RVS  Examination Guidelines: A complete evaluation includes B-mode imaging, spectral Doppler, color Doppler,  and power Doppler as needed of all accessible portions of each vessel. Bilateral testing is considered an integral part of a complete examination. Limited examinations for reoccurring indications may be performed as noted.  +---------+---------------+---------+-----------+----------+-------+  RIGHT     Compressibility Phasicity Spontaneity Properties Summary  +---------+---------------+---------+-----------+----------+-------+  CFV       Full            Yes       Yes                             +---------+---------------+---------+-----------+----------+-------+  SFJ       Full                                                      +---------+---------------+---------+-----------+----------+-------+  FV Prox   Full                                                      +---------+---------------+---------+-----------+----------+-------+  FV Mid    Full                                                      +---------+---------------+---------+-----------+----------+-------+  FV Distal Full                                                       +---------+---------------+---------+-----------+----------+-------+  PFV       Full                                                      +---------+---------------+---------+-----------+----------+-------+  POP       Full            Yes       Yes                             +---------+---------------+---------+-----------+----------+-------+  PTV       Full                                                      +---------+---------------+---------+-----------+----------+-------+  PERO      Full                                                      +---------+---------------+---------+-----------+----------+-------+   +----+---------------+---------+-----------+----------+-------+  LEFT Compressibility Phasicity  Spontaneity Properties Summary  +----+---------------+---------+-----------+----------+-------+  CFV  Full            Yes       Yes                             +----+---------------+---------+-----------+----------+-------+     Summary: Right: There is no evidence of deep vein thrombosis in the lower extremity. No cystic structure found in the popliteal fossa. Left: No evidence of common femoral vein obstruction.  *See table(s) above for measurements and observations. Electronically signed by Servando Snare MD on 02/09/2019 at 4:29:18 PM.    Final      Time spent: 35 minutes  Author: Berle Mull, MD [facility] 02/09/2019 7:36 PM  To reach On-call, see care teams to locate the attending and reach out to them via [email or web address]. If 7PM-7AM, please contact night-coverage If you still have difficulty reaching the attending provider, please page the [facility] (Director on Call) for [facility] on amion for assistance.

## 2019-02-09 NOTE — Progress Notes (Signed)
CRITICAL VALUE ALERT  Critical Value:  Calcium 13.1  Date & Time Notied:  02/09/2019 2145  Provider Notified: [place]  Orders Received/Actions taken: Will treat and monitor per MD orders

## 2019-02-09 NOTE — Progress Notes (Signed)
CRITICAL VALUE ALERT  Critical Value: Lactic acid 3.3  Date & Time Notied:  02/09/19 06:33  Provider Notified:K. Hilbert Bible ,NP  Orders Received/Actions taken: Awaiting response

## 2019-02-09 NOTE — Progress Notes (Signed)
Lower extremity venous has been completed.   Preliminary results in CV Proc.   Eric Price 02/09/2019 2:18 PM

## 2019-02-09 NOTE — Consult Note (Signed)
Consultation Note Date: 02/09/2019   Patient Name: Eric Price  DOB: [birth year]  MRN: [id number]  Age / Sex: 68 y.o., male  PCP: [name], MD Referring Physician: Lavina Hamman, MD  Reason for Consultation: Establishing goals of care  HPI/Patient Profile: 68 y.o. male  with past medical history of CAD s/p PCI, nonischemic cardiomyopathy, ICD placement, HTN, HLD, BPH, T2DM, gout, arterial thrombus s/p left BKA, HFpEF EF 40-45%, mycosis fungoides, and chronic anticoagulation admitted on 02/08/2019 with fatigue, poor oral intake. Patient followed by [facility] for mycosis fungoides. Recently deemed a poor candidate for further chemotherapy and was discharge home with hospice services April 2020. Recent [facility] admission for bacteremia discharged with PICC/IV antibiotics x6 weeks. In ED, patient with severe hypercalcemia, AKI on CKD, sepsis, and mild fluid overload. Palliative medicine consultation for goals of care.   Clinical Assessment and Goals of Care:  I have reviewed medical records, discussed with care team, and assessed the patient at bedside. Eric Price is awake, alert, oriented and able to answer simple questions. He appears weak and requests assist with feeding for meals. He is forgetful and unable to fully participate in Eric Price discussion. He is agreeable for PMT provider to discuss with his wife, Eric Price.   Shortly after, spoke with wife Eric Price via telephone. Unable to meet in person due to Covid-19 visitor restrictions.   Introduced Palliative Medicine as specialized medical care for people living with serious illness. It focuses on providing relief from the symptoms and stress of a serious illness. The goal is to improve quality of life for both the patient and the family.  Discussed recent events including hospitalization at [facility], when discharged home with hospice services. Also recent  hospitalization for bacteremia and discharged home with home health services (hospice would not manage IV antibiotics/PICC line). Eric Price tells me he has completed about 2 weeks of his antibiotics but has continued to decline physically and nutritionally.   Discussed course of hospitalization including diagnoses, interventions, labs/test results. Reviewed CT results 2cm tissue lesion possibly indicating malignancy but MRI would provide further information. Explained that MRI would not change current plan of care and his eligibility for further chemotherapy, with poor performance status.  Eric Price states "I need honesty" and asks about prognosis. She confirms her understanding that "infection will be an ongoing thing." Explained fear with poor prognosis due to underlying cancer, infection, possible resistance to antibiotics, hypercalcemia, and declining functional/cognitive/nutritional status. She tells me he has been very forgetful, especially when calcium is elevated.   The natural cancer disease trajectory and expectations at EOL were discussed.  I attempted to elicit values and goals of care important to the patient and wife. Advanced directives, concepts specific to code status, artifical feeding and hydration, and rehospitalization were considered and discussed. Eric Price confirms that he has advance directive and she is documented HCPOA. She tells me he has a gold DNR form. I explained that yesterday he request CPR to be attempted for 10-15 minutes and refused DNR arm band. She states "try  it and if it doesn't work, let him go." Wife then speaks of her understanding that "if he is resuscitate, he is not going to survive." Explained the fear that we would cause pain/suffering at EOL and yes, that he will likely not survive or with meaningful quality of life with underlying cancer and poor prognosis.   Eric Price speaks of preparing herself and the patient's family for EOL. "Only going to get worse."  Wife and family speak of the importance for him to "never go into a nursing home." It has been challenging for Eric Price to care for him in their home. She believes hospice would "take better care of him." We considering transitioning to an oral antibiotic if hospice services could be re-initiated in the home.   Further considered residential hospice facility as an option, explaining fear of 2-3 week or less prognosis.    The difference between aggressive medical intervention and comfort care was considered. Eric Price speaks of the importance of keeping him "comfortable" and states "if he can't come home, make him comfortable."   Discussed watchful waiting and continuing current plan of care. Reassured her that I would discuss plan of care with [name] and f/u with her in AM. Wife appreciative of information. Questions and concerns were addressed.    SUMMARY OF RECOMMENDATIONS    Continue FULL code and current plan of care.   Will further discuss [place] with wife in AM. Wife leaning toward comfort measures/hospice services. Likely would be eligible for residential hospice facility if goals are aimed at full comfort.   Code Status/Advance Care Planning:  Full code  Symptom Management:   Per attending  Palliative Prophylaxis:   Aspiration, Delirium Protocol, Frequent Pain Assessment, Oral Care and Turn Reposition  Psycho-social/Spiritual:   Desire for further Chaplaincy support:yes  Additional Recommendations: Caregiving  Support/Resources, Compassionate Wean Education and Education on Hospice  Prognosis:   Poor prognosis  Discharge Planning: To Be Determined      Primary Diagnoses: Present on Admission: . Sepsis ([place]) . HTN (hypertension) . HLD (hyperlipidemia) . Mycosis fungoides ([place]) . BPH (benign prostatic hyperplasia) . Obesity (BMI 30-39.9) . Major depressive disorder, recurrent episode ([place]) . ICD (implantable cardioverter-defibrillator) in place . Chronic  combined systolic and diastolic CHF (congestive heart failure) ([place]) . Acute kidney injury ([place]) . CKD (chronic kidney disease) stage 3, GFR 30-59 ml/min (HCC) . Bacteremia due to Enterococcus   I have reviewed the medical record, interviewed the patient and family, and examined the patient. The following aspects are pertinent.  Past Medical History:  Diagnosis Date  . Arthritis    knees, shoulder, hands   . Chronic combined systolic and diastolic CHF (congestive heart failure) ([place]) 07/17/2016  . Coronary artery disease   . Critical lower limb ischemia   . Depression   . Hyperlipidemia   . Hypertension   . Mycosis fungoides ([place])    ALK negative; TCR positive; CD30 positive, CD3 positive.   . Nonischemic cardiomyopathy ([place])   . Peripheral vascular disease ([place])   . Pneumonia 2013   hosp. - [facility] x1 week   . Renal insufficiency 03/08/2018  . Shortness of breath dyspnea    related to pain currently  . SIRS (systemic inflammatory response syndrome) ([place]) 03/2018  . Sleep apnea    10-20 yrs. ago, states he used CPAP, not needed anymore.   . Type II diabetes mellitus ([place])    Social History   Socioeconomic History  . Marital status: Married    Spouse name:  Not on file  . Number of children: 2  . Years of education: Not on file  . Highest education level: Not on file  Occupational History    Comment: upholster.   Social Needs  . Financial resource strain: Not on file  . Food insecurity:    Worry: Not on file    Inability: Not on file  . Transportation needs:    Medical: Not on file    Non-medical: Not on file  Tobacco Use  . Smoking status: Never Smoker  . Smokeless tobacco: Never Used  Substance and Sexual Activity  . Alcohol use: No    Alcohol/week: 0.0 standard drinks  . Drug use: No  . Sexual activity: Not Currently  Lifestyle  . Physical activity:    Days per week: Not on file    Minutes per session: Not on file  . Stress: Not on file  Relationships  .  Social connections:    Talks on phone: Not on file    Gets together: Not on file    Attends religious service: Not on file    Active member of club or organization: Not on file    Attends meetings of clubs or organizations: Not on file    Relationship status: Not on file  Other Topics Concern  . Not on file  Social History Narrative   Lives with wife.    Family History  Problem Relation Age of Onset  . CAD Father 51       Died 76  . Hypertension Father   . Heart failure Mother 55  . Diabetes Maternal Grandmother   . CAD Paternal Grandfather 58  . Colon cancer Neg Hx   . Esophageal cancer Neg Hx   . Stomach cancer Neg Hx   . Rectal cancer Neg Hx    Scheduled Meds: . carvedilol  6.25 mg Oral BID WC  . docusate sodium  100 mg Oral BID  . feeding supplement (ENSURE ENLIVE)  237 mL Oral TID BM  . insulin aspart  0-5 Units Subcutaneous QHS  . insulin aspart  0-9 Units Subcutaneous TID WC  . levalbuterol  0.63 mg Nebulization Q8H  . multivitamin with minerals  1 tablet Oral Daily  . pantoprazole  40 mg Oral Daily  . prednisoLONE acetate  1 drop Both Eyes Daily  . sodium chloride flush  10-40 mL Intracatheter Q12H   Continuous Infusions: . sodium chloride 125 mL/hr at 02/09/19 1650  . sodium chloride Stopped (02/09/19 1650)  . ceFEPime (MAXIPIME) IV 2 g (02/09/19 1650)  . DAPTOmycin (CUBICIN)  IV    . heparin 1,400 Units/hr (02/09/19 1650)   PRN Meds:.sodium chloride, acetaminophen **OR** acetaminophen, albuterol, ondansetron **OR** ondansetron (ZOFRAN) IV Medications Prior to Admission:  Prior to Admission medications   Medication Sig Start Date End Date Taking? Authorizing Provider  acetaminophen (TYLENOL) 500 MG tablet Take 500-1,000 mg by mouth every 8 (eight) hours as needed for mild pain.     [provider]  albuterol (PROVENTIL) (2.5 MG/3ML) 0.083% nebulizer solution Take 3 mLs (2.5 mg total) by nebulization 2 (two) times daily as needed for wheezing or  shortness of breath. 11/27/18   Kayleen Memos, DO  atorvastatin (LIPITOR) 20 MG tablet Take 1 tablet (20 mg total) by mouth daily. 02/07/19   Burchette, Alinda Sierras, MD  B-D UF III MINI PEN NEEDLES 31G X 5 MM MISC  08/28/18   [provider]  benzonatate (TESSALON) 200 MG capsule TAKE 1 CAPSULE(200  MG) BY MOUTH THREE TIMES DAILY AS NEEDED FOR COUGH 01/02/19   Burchette, Alinda Sierras, MD  Blood Glucose Monitoring Suppl (ACCU-CHEK AVIVA) device Use to check blood sugar 6 times daily 08/08/18 08/08/19  Philemon Kingdom, MD  carvedilol (COREG) 25 MG tablet Take 1.5 tablets (37.5 mg total) by mouth 2 (two) times daily with a meal. Patient taking differently: Take 25 mg by mouth 2 (two) times daily with a meal.  07/01/18   Croitoru, Mihai, MD  clobetasol cream (TEMOVATE) 1.61 % Apply 1 application topically 2 (two) times daily.  10/13/18   [provider]  daptomycin (CUBICIN) IVPB Inject 800 mg into the vein daily. Indication:  Enterococcus bacteremia Last Day of Therapy:  02/04/2019 Labs - Once weekly:  CBC/D, BMP, and CPK Labs - Every other week:  ESR and CRP 01/28/19 03/07/19  Sheikh, Omair Latif, DO  Dulaglutide (TRULICITY) 1.5 WR/6.0AV SOPN Inject 1.5 mg into the skin every Tuesday. 11/08/18   Philemon Kingdom, MD  furosemide (LASIX) 80 MG tablet Take no Lasix if weight is under 200; take 1 tablet daily if weight over 200 and bid if weight is over 205 12/20/18   Kilroy, Lurena Joiner K, PA-C  glucagon 1 MG injection Inject 1 mg into the vein once as needed (for high blood sugar). Patient taking differently: Inject 1 mg into the vein once as needed (for low blood sugar).  04/13/18   Burchette, Alinda Sierras, MD  glucose blood (ACCU-CHEK ACTIVE STRIPS) test strip Use to check blood sugar 6 times a day 08/08/18   Philemon Kingdom, MD  insulin aspart (NOVOLOG FLEXPEN) 100 UNIT/ML FlexPen INJECT 20-25 UNITS BEFORE BREAKFAST, LUNCH , DINNER Patient not taking: Reported on 01/23/2019 11/03/18   Philemon Kingdom, MD  Insulin  Detemir (LEVEMIR FLEXTOUCH) 100 UNIT/ML Pen Inject 10 Units into the skin at bedtime.  01/18/19   [provider]  loratadine (CLARITIN) 10 MG tablet Take 10 mg by mouth daily.    [provider]  Melatonin 3 MG TABS Take 1 tablet by mouth at bedtime.    [provider]  Nutritional Supplements (ENSURE COMPLETE PO) Take 237 mLs by mouth daily.  03/14/18   [provider]  nystatin (MYCOSTATIN) 100000 UNIT/ML suspension Take 5 mLs by mouth 3 (three) times daily.  06/12/18   [provider]  ondansetron (ZOFRAN) 8 MG tablet Take 8 mg by mouth every 8 (eight) hours as needed for nausea or vomiting.  09/23/18   [provider]  ONE TOUCH LANCETS MISC Check 2 times daily. 08/16/14   Burchette, Alinda Sierras, MD  pantoprazole (PROTONIX) 40 MG tablet TAKE 1 TABLET(40 MG) BY MOUTH DAILY Patient taking differently: Take 40 mg by mouth daily.  11/23/18   Croitoru, Mihai, MD  prednisoLONE acetate (PRED FORTE) 1 % ophthalmic suspension Place 1 drop into both eyes daily. 12/23/18   [provider]  prochlorperazine (COMPAZINE) 10 MG tablet Take 10 mg by mouth 3 (three) times daily.  08/28/18   [provider]  rivaroxaban (XARELTO) 20 MG TABS tablet Take 1 tablet (20 mg total) by mouth daily with supper. 08/30/18   Burchette, Alinda Sierras, MD  sennosides-docusate sodium (SENOKOT-S) 8.6-50 MG tablet Take 1 tablet by mouth daily.    [provider]  traMADol (ULTRAM) 50 MG tablet Take 1 tablet (50 mg total) by mouth every 12 (twelve) hours as needed for moderate pain. 12/13/18   Burchette, Alinda Sierras, MD  traZODone (DESYREL) 50 MG tablet Take 1 tablet by  mouth at bedtime. 01/19/19   [provider]   Allergies  Allergen Reactions  . Morphine Anaphylaxis and Shortness Of Breath  . Morphine And Related Anaphylaxis and Shortness Of Breath    "took my breath away"  . Oxycodone Anaphylaxis, Shortness Of Breath, Anxiety and Other (See Comments)     Pt stated, "It makes me climbs walls; fight wars"  . Daptomycin Other (See Comments)    "too strong; has drained the life out of him"  . Robaxin [Methocarbamol] Other (See Comments)    "went out of his mind"   Review of Systems  Unable to perform ROS: Other   Physical Exam Vitals signs and nursing note reviewed.  Constitutional:      General: He is awake.     Appearance: He is ill-appearing.  HENT:     Head: Normocephalic and atraumatic.  Pulmonary:     Effort: No tachypnea, accessory muscle usage or respiratory distress.  Abdominal:     Tenderness: There is no abdominal tenderness.  Skin:    General: Skin is warm and dry.  Neurological:     Mental Status: He is alert.     Comments: Oriented but forgetful  Psychiatric:        Mood and Affect: Mood normal.        Speech: Speech normal.        Behavior: Behavior normal.    Vital Signs: BP 115/69 (BP Location: Left Arm)   Pulse (!) 113   Temp 98.3 F (36.8 C) (Oral)   Resp 15   Ht _0  (1.727 m)   Wt 99 kg   SpO2 92%   BMI 33.19 kg/m  Pain Scale: 0-10 POSS *See Group Information*: 1-Acceptable,Awake and alert Pain Score: 3    SpO2: SpO2: 92 % O2 Device:SpO2: 92 % O2 Flow Rate: .   IO: Intake/output summary:   Intake/Output Summary (Last 24 hours) at 02/09/2019 1726 Last data filed at 02/09/2019 1650 Gross per 24 hour  Intake 2727.85 ml  Output 1720 ml  Net 1007.85 ml    LBM: Last BM Date: 02/07/19 Baseline Weight: Weight: 95.3 kg Most recent weight: Weight: 99 kg     Palliative Assessment/Data: PPS 30%   Flowsheet Rows     Most Recent Value  Intake Tab  Referral Department  Hospitalist  Unit at Time of Referral  Med/Surg Unit  Palliative Care Primary Diagnosis  Cancer  Palliative Care Type  Return patient Palliative Care  Reason for referral  Clarify Goals of Care  Date first seen by Palliative Care  02/09/19  Clinical Assessment  Palliative Performance Scale Score  30%  Psychosocial & Spiritual  Assessment  Palliative Care Outcomes  Patient/Family meeting held?  Yes  Who was at the meeting?  spoke with patient and wife via telephone  Palliative Care Outcomes  Provided end of life care assistance, Clarified goals of care, Provided psychosocial or spiritual support, ACP counseling assistance, Counseled regarding hospice      Time In/Out: 1440-1455, 1600-1700 Time Total: 59mn Greater than 50%  of this time was spent counseling and coordinating care related to the above assessment and plan.  Signed by:  MIhor Dow FNP-C Palliative Medicine Team  Phone: [phone]: [phone]  Please contact Palliative Medicine Team phone at [phone] questions and concerns.  For individual provider: See AShea Evans

## 2019-02-09 NOTE — Progress Notes (Signed)
Initial Nutrition Assessment  RD working remotely.  DOCUMENTATION CODES:   Obesity unspecified  INTERVENTION:   -Continue Ensure Enlive po TID, each supplement provides 350 kcal and 20 grams of protein -MVI with minerals daily  NUTRITION DIAGNOSIS:   Increased nutrient needs related to cancer and cancer related treatments as evidenced by estimated needs.  GOAL:   Patient will meet greater than or equal to 90% of their needs  MONITOR:   PO intake, Supplement acceptance, Labs, Weight trends, Skin, I & O's  REASON FOR ASSESSMENT:   Malnutrition Screening Tool    ASSESSMENT:   Eric Price is a 68 y.o. male with Past medical history of CAD s/p PCI, HTN, HLD, BPH, T2DM, gout, history of arterial thrombus s/p L BKA, HFrEF (last EF in 40-45% on 03/2018), mycosis fungoides s/p 6 cycles of EPO CH completed in 11/2017, 6 cycles of Mogamulizumab and 6 cycles of Bendamustine/Brentuximab, last on cycle 2 of Pembrolizumab, onChronic anticoagulation, sp Left BKA.  Pt admitted with severe hypercalcemia, acute kidney injury, and acute metabolic encephalopathy.   Per H&P, pt is not longer an candidate for chemotherapy for mycosis fungoides; plan for palliative care consult.   Reviewed I/O's: +896 ml x 24 hours   UOP: 520 ml x 24 hours  Pt down in vascular lab at time of visit. Unable to obtain further nutrition-related history from pt at this time.   Per prior RD note in 03/2018, pt had poor oral intake due to throat mucous from chemotherapy side effects. He was taking at least one Ensure supplement at home during that time. Pt on a heart healthy diet currently, however, no meal documentation available to assess intake since admission.   Reviewed wt hx; wt has been stable over the past 5 months.   Pt has Ensure Enlive ordered TID, which he is consuming per MAR. Pt with poor oral intake and would benefit from nutrient dense supplement. One Ensure Enlive supplement provides 350 kcals, 20  grams protein, and 44-45 grams of carbohydrate vs one Glucerna shake supplement, which provides 220 kcals, 10 grams of protein, and 26 grams of carbohydrate. Given pt's hx of DM, RD will continue to monitor PO intake, CBGS, and adjust supplement regimen as appropriate. Note that pt is meeting glycemic goals at this time.   Pt is at risk for malnutrition, however, unable to identify at this time.   Lab Results  Component Value Date   HGBA1C 6.4 (H) 01/24/2019   PTA DM medications are 1/5 mg dulaglutide q week and 10 units insulin detemir q HS.   Labs reviewed: CBGS: 76-80 (inpatient orders for glycemic control are 0-5 units insulin aspart q HS, 0-9 units insulin aspart TID with meals, ).   NUTRITION - FOCUSED PHYSICAL EXAM:    Most Recent Value  Orbital Region  Unable to assess  Upper Arm Region  Unable to assess  Thoracic and Lumbar Region  Unable to assess  Buccal Region  Unable to assess  Temple Region  Unable to assess  Clavicle Bone Region  Unable to assess  Clavicle and Acromion Bone Region  Unable to assess  Scapular Bone Region  Unable to assess  Dorsal Hand  Unable to assess  Patellar Region  Unable to assess  Anterior Thigh Region  Unable to assess  Posterior Calf Region  Unable to assess  Edema (RD Assessment)  Unable to assess  Hair  Unable to assess  Eyes  Unable to assess  Mouth  Unable to assess  Skin  Unable to assess  Nails  Unable to assess       Diet Order:   Diet Order            Diet Heart Room service appropriate? Yes; Fluid consistency: Thin  Diet effective now              EDUCATION NEEDS:   No education needs have been identified at this time  Skin:  Skin Assessment: Skin Integrity Issues: Skin Integrity Issues:: Other (Comment) Other: lower medial abdomen wound  Last BM:  02/07/19  Height:   Ht Readings from Last 1 Encounters:  02/09/19 5\' 8"  (1.727 m)    Weight:   Wt Readings from Last 1 Encounters:  02/09/19 99 kg    Ideal  Body Weight:  65.5 kg  BMI:  Body mass index is 33.19 kg/m.  Estimated Nutritional Needs:   Kcal:  1950-2150  Protein:  95-110 grams  Fluid:  > 1.9 L    Eric Price A. Jimmye Norman, RD, LDN, [place] Registered Dietitian II Certified Diabetes Care and Education Specialist Pager: [phone] After hours Pager: [phone]

## 2019-02-09 NOTE — Plan of Care (Signed)
  Problem: Acute Rehab OT Goals (only OT should resolve) Goal: Pt. Will Perform Grooming Flowsheets (Taken 02/09/2019 1252) Pt Will Perform Grooming: with modified independence; sitting Goal: Pt. Will Perform Upper Body Bathing Flowsheets (Taken 02/09/2019 1252) Pt Will Perform Upper Body Bathing: with set-up; sitting Goal: Pt. Will Perform Lower Body Bathing Flowsheets (Taken 02/09/2019 1252) Pt Will Perform Lower Body Bathing: with min assist; sitting/lateral leans; sit to/from stand Goal: Pt. Will Perform Upper Body Dressing Flowsheets (Taken 02/09/2019 1252) Pt Will Perform Upper Body Dressing: with set-up; sitting Goal: Pt. Will Transfer To Toilet Flowsheets (Taken 02/09/2019 1252) Pt Will Transfer to Toilet: with min assist; bedside commode Goal: Pt. Will Perform Toileting-Clothing Manipulation Flowsheets (Taken 02/09/2019 1252) Pt Will Perform Toileting - Clothing Manipulation and hygiene: with min assist; sitting/lateral leans Goal: Pt/Caregiver Will Perform Home Exercise Program Flowsheets (Taken 02/09/2019 1252) Pt/caregiver will Perform Home Exercise Program: Increased strength; Both right and left upper extremity; With written HEP provided

## 2019-02-09 NOTE — Progress Notes (Signed)
Pharmacy Antibiotic Note  Eric Price is a 68 y.o. male admitted on 02/08/2019 with concerns for sepsis from unknown source.  Pharmacy has been consulted for daptomycin/cefepime dosing. He has a history of Enterococcus faecalis bacteremia, which was treated with IV ampicillin and ceftriaxone, transitioned to daptomycin 800 mg IV daily for 6 weeks of therapy (possible stop date of 6/2) with plans for indefinite suppression with amoxicillin 500 mg TID.   Patient confirms he last received a dose of daptomycin on 5/5. LA 6.1>>3.3, WBC 8.6, Scr 2.93>>2.57 (baseline 1-1.6).  Plan: Will increase daptomycin to 800 mg IV q24 hr for improving renal function and CrCl >30 next dose due 5/7 PM.  Cefepime 2g IV q24h Monitor clinical status, cultures, length of therapy and renal function   Temp (24hrs), Avg:97.9 F (36.6 C), Min:97.5 F (36.4 C), Max:98.3 F (36.8 C)  Recent Labs  Lab 02/08/19 1615 02/08/19 1803 02/08/19 2302 02/09/19 0333 02/09/19 0532  WBC 8.6  --   --  7.7  --   CREATININE 2.93*  --  2.75* 2.57*  --   LATICACIDVEN 6.1* 5.7*  --   --  3.3*    Estimated Creatinine Clearance: 31.4 mL/min (A) (by C-G formula based on SCr of 2.57 mg/dL (H)).    Allergies  Allergen Reactions  . Morphine Shortness Of Breath and Anaphylaxis  . Morphine And Related     "took my breath away"  . Oxycodone     Pt stated, "It makes me climbs walls; fight wars"  . Robaxin [Methocarbamol] Other (See Comments)    Antimicrobials this admission: Cefepime 5/6 >> Daptomycin 4/24 >> (6/2) Ampicillin 4/21 >> 4/24 Ceftriaxone 4/21  >> 4/24  Dose adjustments this admission:   Microbiology results: 5/6 BCx: 5/6 COVID:  5/6 UCx:  Eric Price A. Levada Dy, PharmD, [place] Please utilize Amion for appropriate phone number to reach the unit pharmacist ([place])   02/09/2019 9:39 AM

## 2019-02-09 NOTE — Progress Notes (Deleted)
Dictation on: 02/09/2019  9:26 AM by: Lavina Hamman [[id number]]

## 2019-02-09 NOTE — Progress Notes (Signed)
ANTICOAGULATION CONSULT NOTE - [place] for heparin Indication: h/o VTE  Allergies  Allergen Reactions  . Morphine Anaphylaxis and Shortness Of Breath  . Morphine And Related Anaphylaxis and Shortness Of Breath    "took my breath away"  . Oxycodone Anaphylaxis, Shortness Of Breath, Anxiety and Other (See Comments)    Pt stated, "It makes me climbs walls; fight wars"  . Daptomycin Other (See Comments)    "too strong; has drained the life out of him"  . Robaxin [Methocarbamol] Other (See Comments)    "went out of his mind"    Patient Measurements: Height: 5' 8"  (172.7 cm) Weight: 218 lb 4.1 oz (99 kg) IBW/kg (Calculated) : 68.4 Heparin Dosing Weight: 99kg  Vital Signs: Temp: 97.9 F (36.6 C) (05/07 2130) BP: 118/71 (05/07 2130) Pulse Rate: 119 (05/07 2130)  Labs: Recent Labs    02/08/19 1615 02/08/19 2302 02/09/19 0333 02/09/19 2055  HGB 9.6*  --  8.6*  --   HCT 28.8*  --  26.7*  --   PLT 280  --  232  --   APTT  --   --   --  73*  HEPARINUNFRC  --   --   --  1.06*  CREATININE 2.93* 2.75* 2.57*  --   CKTOTAL  --  268  --   --     Estimated Creatinine Clearance: 31.4 mL/min (A) (by C-G formula based on SCr of 2.57 mg/dL (H)).   Medical History: Past Medical History:  Diagnosis Date  . Arthritis    knees, shoulder, hands   . Chronic combined systolic and diastolic CHF (congestive heart failure) ([place]) 07/17/2016  . Coronary artery disease   . Critical lower limb ischemia   . Depression   . Hyperlipidemia   . Hypertension   . 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   . Nonischemic cardiomyopathy ([place])   . Peripheral vascular disease ([place])   . Pneumonia 2013   hosp. - MCH x1 week   . Renal insufficiency 03/08/2018  . Shortness of breath dyspnea    related to pain currently  . SIRS (systemic inflammatory response syndrome) ([place]) 03/2018  . Sleep apnea    10-20 yrs. ago, states he used  CPAP, not needed anymore.   . 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 Patient supposedly on rivaroxaban, however home medication list not finalized at this point. MD wishes to change patient to heparin drip while off of rivaroxaban. It is unknown when the patient's last dose of rivaroxaban was, however patient presented to ED yesterday afternoon so patient is likely at least 24 hour out from last dose.   Initial heparin level skewed by recent DOAC use (5/5), aPTT therapeutic at 73 seconds.   Goal of Therapy:  Heparin level 0.3-0.7 units/ml  APTT 66-102 Monitor platelets by anticoagulation protocol: Yes   Plan:  -Continue heparin 1400 units/hr -Daily heparin level and aPTT until correlating   Arrie Senate, PharmD, BCPS Clinical Pharmacist [phone] Please check AMION for all [facility] numbers 02/09/2019

## 2019-02-09 NOTE — Evaluation (Signed)
Occupational Therapy Evaluation Patient Details Name: Eric Price MRN: [id number] DOB: March 10, 1951 Today's Date: 02/09/2019    History of Present Illness Eric Price is a 68 y.o. male with Past medical history of CAD s/p PCI, HTN, HLD, BPH, T2DM, gout, history of arterial thrombus s/p L BKA, HFrEF (last EF in 40-45% on 03/2018), mycosis fungoides s/p 6 cycles of EPO CH completed in 11/2017, 6 cycles of Mogamulizumab and 6 cycles of Bendamustine/Brentuximab, last on cycle 2 of Pembrolizumab, onChronic anticoagulation, sp Left BKA.   Clinical Impression   Pt in bed upon therapy arrival. Once therapist able to arouse patient with verbal and physical cueing, patient was agreeable to participate in OT evaluation. Patient declined to stand or transfer to recliner and evaluation was completed seated on EOB and at bed level. Pt is presenting with general weakness in BUE with decreased endurance requiring increased assistance to complete basic ADL tasks. Pt reports that his current functional status is decreased from baseline. Pt will benefit from skilled OT services to focus on mentioned deficits while in acute care setting. At this time, patient will benefit from discharge to SNF unless his functional performance level increasing and he is able to complete functional transfers and ADL tasks with less need for physical assistance. If he progresses in therapy during his admission, patient may benefit from Home health OT services. Will continue to work with patient during admission and assess discharge recommendation on a continuous basis.      Follow Up Recommendations  Supervision/Assistance - 24 hour;SNF    Equipment Recommendations  None recommended by OT       Precautions / Restrictions Precautions Precautions: Fall Precaution Comments: wears L prosthesis Restrictions Weight Bearing Restrictions: No      Mobility Bed Mobility Overal bed mobility: Needs Assistance Bed Mobility: Supine to  Sit;Sit to Supine     Supine to sit: Mod assist;HOB elevated Sit to supine: Mod assist      Transfers Overall transfer level: (Not completed due to fatigue level)                    Balance Overall balance assessment: Needs assistance Sitting-balance support: Feet supported Sitting balance-Leahy Scale: Good       ADL either performed or assessed with clinical judgement   ADL Overall ADL's : Needs assistance/impaired Eating/Feeding: Modified independent   Grooming: Oral care;Set up;Sitting   Upper Body Bathing: Set up;Sitting   Lower Body Bathing: Moderate assistance;Bed level   Upper Body Dressing : Set up;Sitting   Lower Body Dressing: Maximal assistance;Bed level   Toilet Transfer: (Transfer not complete due to fatigue level. )                   Vision Baseline Vision/History: No visual deficits Vision Assessment?: No apparent visual deficits            Pertinent Vitals/Pain Pain Assessment: No/denies pain     Hand Dominance Right   Extremity/Trunk Assessment Upper Extremity Assessment Upper Extremity Assessment: Generalized weakness   Lower Extremity Assessment Lower Extremity Assessment: Defer to PT evaluation       Communication     Cognition Arousal/Alertness: Awake/alert Behavior During Therapy: WFL for tasks assessed/performed Overall Cognitive Status: Within Functional Limits for tasks assessed                    Home Living Family/patient expects to be discharged to:: Private residence Living Arrangements: Spouse/significant other Available Help at Discharge: Family;Available 24 hours/day  Type of Home: House Home Access: Stairs to enter;Ramped entrance Entrance Stairs-Number of Steps: 3 Entrance Stairs-Rails: None Home Layout: Two level;Able to live on main level with bedroom/bathroom Alternate Level Stairs-Number of Steps: flight   Bathroom Shower/Tub: Teacher, early years/pre: Standard Bathroom  Accessibility: Yes              Prior Functioning/Environment Level of Independence: Needs assistance  Gait / Transfers Assistance Needed: Pt reports that he was able to ambulate in the home with a 4 wheeled walker.  ADL's / Homemaking Assistance Needed: He receives assistance to complete basic ADL tasks such as bathing and dressing. He is independent with grooming and self feeding.            OT Problem List: Decreased strength;Decreased activity tolerance;Impaired balance (sitting and/or standing);Decreased knowledge of use of DME or AE      OT Treatment/Interventions: Self-care/ADL training;Therapeutic exercise;Neuromuscular education;Energy conservation;DME and/or AE instruction;Therapeutic activities;Patient/family education;Balance training    OT Goals(Current goals can be found in the care plan section) Acute Rehab OT Goals Patient Stated Goal: To return home OT Goal Formulation: With patient Time For Goal Achievement: 02/23/19 Potential to Achieve Goals: Good  OT Frequency: Min 3X/week              AM-PAC OT "6 Clicks" Daily Activity     Outcome Measure Help from another person eating meals?: None Help from another person taking care of personal grooming?: A Little Help from another person toileting, which includes using toliet, bedpan, or urinal?: A Lot Help from another person bathing (including washing, rinsing, drying)?: A Lot Help from another person to put on and taking off regular upper body clothing?: A Little Help from another person to put on and taking off regular lower body clothing?: A Lot 6 Click Score: 16   End of Session    Activity Tolerance: Patient limited by fatigue Patient left: in bed;with bed alarm set;with call bell/phone within reach;with nursing/sitter in room  OT Visit Diagnosis: Muscle weakness (generalized) (M62.81)                Time: 5883-2549 OT Time Calculation (min): 23 min Charges:  OT General Charges $OT Visit: 1  Visit OT Evaluation $OT Eval Moderate Complexity: 1 Mod OT Treatments $Self Care/Home Management : 8-22 mins  Ailene Ravel, OTR/L,CBIS  [phone]   Gao Mitnick, Clarene Duke 02/09/2019, 12:43 PM

## 2019-02-09 NOTE — Evaluation (Signed)
Physical Therapy Evaluation Patient Details Name: Eric Price MRN: [id number] DOB: 06/23/1951 Today's Date: 02/09/2019   History of Present Illness  Pt is a 68 y/o male admitted secondary to sepsis from unknown source, however, likely from UTI. Pt presenting with increased lethargy and weakness. PMH includes CAD, HTN, PVD, non ischemic cardiomyopathy, DM, and L BKA.   Clinical Impression  Pt admitted secondary to problem above with deficits below. Pt with increased lethargy and slowed processing during session. Was only agreeable to performing supine HEP and bed mobility tasks. Required mod A to roll from side to side. Unsure of pt's baseline, however, given current deficits, feel pt will require SNF level therapies at d/c. Will continue to follow acutely to maximize functional mobility independence and safety.     Follow Up Recommendations SNF;Supervision/Assistance - 24 hour    Equipment Recommendations  None recommended by PT    Recommendations for Other Services       Precautions / Restrictions Precautions Precautions: Fall Precaution Comments: wears L prosthesis Restrictions Weight Bearing Restrictions: No      Mobility  Bed Mobility Overal bed mobility: Needs Assistance Bed Mobility: Rolling Rolling: Mod assist         General bed mobility comments: Pt refusing to sit at EOB but was agreeable to rolling. Required mod A and heavy use of UEs to roll from side to side.   Transfers                 General transfer comment: Pt refusing.   Ambulation/Gait                Stairs            Wheelchair Mobility    Modified Rankin (Stroke Patients Only)       Balance                                             Pertinent Vitals/Pain Pain Assessment: No/denies pain    Home Living Family/patient expects to be discharged to:: Private residence Living Arrangements: Spouse/significant other Available Help at Discharge:  Family;Available 24 hours/day Type of Home: House Home Access: Stairs to enter;Ramped entrance Entrance Stairs-Rails: Right;Left Entrance Stairs-Number of Steps: 9 Home Layout: One level Home Equipment: Walker - 2 wheels;Cane - single point;Bedside commode;Tub bench;Wheelchair - manual Additional Comments: Pt reporting inconsistencies between OT and PT session. Pt reporting he has 9 steps, whereas he reported 3 with OT. Also reports his house is one story, however, with OT reports it is 2 stories.     Prior Function Level of Independence: Needs assistance   Gait / Transfers Assistance Needed: Pt reports he uses RW for ambulation. Uses L prosthesis.   ADL's / Homemaking Assistance Needed: He receives assistance to complete basic ADL tasks such as bathing and dressing per OT note.         Hand Dominance   Dominant Hand: Right    Extremity/Trunk Assessment   Upper Extremity Assessment Upper Extremity Assessment: Defer to OT evaluation    Lower Extremity Assessment Lower Extremity Assessment: Generalized weakness;LLE deficits/detail LLE Deficits / Details: L BKA at baseline       Communication      Cognition Arousal/Alertness: Lethargic Behavior During Therapy: WFL for tasks assessed/performed Overall Cognitive Status: Impaired/Different from baseline Area of Impairment: Following commands;Awareness;Problem solving;Memory;Attention  Current Attention Level: Sustained Memory: Decreased short-term memory;Decreased recall of precautions Following Commands: Follows one step commands with increased time   Awareness: Intellectual Problem Solving: Slow processing;Decreased initiation;Difficulty sequencing;Requires verbal cues General Comments: Pt very slow to respond to cues and very slow to respond to questions. Some questions had to be repeated multiple times and pt inconsistently answering. Unsure of baseline.       General Comments       Exercises General Exercises - Lower Extremity Ankle Circles/Pumps: AROM;Right;10 reps Heel Slides: AROM;Right;10 reps Amputee Exercises Knee Flexion: AROM;Left;10 reps;Supine(required multimodal cues to complete) Straight Leg Raises: AROM;Left;10 reps;Supine(required multimodal cues to complete)   Assessment/Plan    PT Assessment Patient needs continued PT services  PT Problem List Decreased strength;Decreased activity tolerance;Decreased mobility;Decreased knowledge of precautions;Decreased safety awareness;Decreased knowledge of use of DME;Pain;Decreased cognition;Decreased balance       PT Treatment Interventions Gait training;DME instruction;Stair training;Functional mobility training;Therapeutic activities;Balance training;Therapeutic exercise;Patient/family education    PT Goals (Current goals can be found in the Care Plan section)  Acute Rehab PT Goals PT Goal Formulation: Patient unable to participate in goal setting Time For Goal Achievement: 02/23/19 Potential to Achieve Goals: Fair    Frequency Min 3X/week   Barriers to discharge        Co-evaluation               AM-PAC PT "6 Clicks" Mobility  Outcome Measure Help needed turning from your back to your side while in a flat bed without using bedrails?: A Lot Help needed moving from lying on your back to sitting on the side of a flat bed without using bedrails?: A Lot Help needed moving to and from a bed to a chair (including a wheelchair)?: Total Help needed standing up from a chair using your arms (e.g., wheelchair or bedside chair)?: Total Help needed to walk in hospital room?: Total Help needed climbing 3-5 steps with a railing? : Total 6 Click Score: 8    End of Session Equipment Utilized During Treatment: Gait belt Activity Tolerance: Patient limited by lethargy Patient left: in bed;with call bell/phone within reach;with bed alarm set Nurse Communication: Mobility status PT Visit Diagnosis:  Unsteadiness on feet (R26.81);Muscle weakness (generalized) (M62.81);Other abnormalities of gait and mobility (R26.89);Difficulty in walking, not elsewhere classified (R26.2)    Time: 1329-1340 PT Time Calculation (min) (ACUTE ONLY): 11 min   Charges:   PT Evaluation $PT Eval Moderate Complexity: [place], PT, DPT  Acute Rehabilitation Services  Pager: [phone] Office: [phone]   Rudean Hitt 02/09/2019, 4:57 PM

## 2019-02-09 NOTE — Progress Notes (Signed)
ANTICOAGULATION CONSULT NOTE - Initial Consult  Pharmacy Consult for heparin Indication: h/o VTE  Allergies  Allergen Reactions  . Morphine Shortness Of Breath and Anaphylaxis  . Morphine And Related     "took my breath away"  . Oxycodone     Pt stated, "It makes me climbs walls; fight wars"  . Robaxin [Methocarbamol] Other (See Comments)    Patient Measurements: Weight: 218 lb 4.1 oz (99 kg) Heparin Dosing Weight: 99kg  Vital Signs: Temp: 98.3 F (36.8 C) (05/07 0514) Temp Source: Oral (05/07 0514) BP: 120/68 (05/07 0514) Pulse Rate: 104 (05/07 0514)  Labs: Recent Labs    02/08/19 1615 02/08/19 2302 02/09/19 0333  HGB 9.6*  --  8.6*  HCT 28.8*  --  26.7*  PLT 280  --  232  CREATININE 2.93* 2.75* 2.57*  CKTOTAL  --  268  --     Estimated Creatinine Clearance: 31.4 mL/min (A) (by C-G formula based on SCr of 2.57 mg/dL (H)).   Medical History: Past Medical History:  Diagnosis Date  . Arthritis    knees, shoulder, hands   . Chronic combined systolic and diastolic CHF (congestive heart failure) ([place]) 07/17/2016  . Coronary artery disease   . Critical lower limb ischemia   . Depression   . Hyperlipidemia   . Hypertension   . 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   . Nonischemic cardiomyopathy ([place])   . Peripheral vascular disease ([place])   . Pneumonia 2013   hosp. - MCH x1 week   . Renal insufficiency 03/08/2018  . Shortness of breath dyspnea    related to pain currently  . SIRS (systemic inflammatory response syndrome) ([place]) 03/2018  . Sleep apnea    10-20 yrs. ago, states he used CPAP, not needed anymore.   . 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 Patient supposedly on rivaroxaban, however home medication list not finalized at this point. MD wishes to change  patient to heparin drip while off of rivaroxaban. It is unknown when the patient's last dose of rivaroxaban was, however patient presented to ED yesterday afternoon so patient is likely at least 24 hour out from last dose.   Due to DOAC use, will use APTT until APTT correlate with heparin levels.    Goal of Therapy:  Heparin level 0.3-0.7 units/ml  APTT 66-102 Monitor platelets by anticoagulation protocol: Yes   Plan:  Heparin drip at 1400 units/hr, no bolus APTT and HL in 6 hours and daily Monitor for bleeding   Antinio Sanderfer A. Levada Dy, PharmD, [place] Please utilize Amion for appropriate phone number to reach the unit pharmacist ([place])    02/09/2019,12:03 PM

## 2019-02-10 DIAGNOSIS — A419 Sepsis, unspecified organism: Secondary | ICD-10-CM | POA: Diagnosis not present

## 2019-02-10 DIAGNOSIS — J189 Pneumonia, unspecified organism: Secondary | ICD-10-CM | POA: Diagnosis not present

## 2019-02-10 LAB — COMPREHENSIVE METABOLIC PANEL
ALT: 20 U/L (ref 0–44)
AST: 43 U/L — ABNORMAL HIGH (ref 15–41)
Albumin: 2.9 g/dL — ABNORMAL LOW (ref 3.5–5.0)
Alkaline Phosphatase: 92 U/L (ref 38–126)
Anion gap: 15 (ref 5–15)
BUN: 31 mg/dL — ABNORMAL HIGH (ref 8–23)
CO2: 25 mmol/L (ref 22–32)
Calcium: 12.8 mg/dL — ABNORMAL HIGH (ref 8.9–10.3)
Chloride: 97 mmol/L — ABNORMAL LOW (ref 98–111)
Creatinine, Ser: 2.77 mg/dL — ABNORMAL HIGH (ref 0.61–1.24)
GFR calc Af Amer: 26 mL/min — ABNORMAL LOW (ref >60)
GFR calc non Af Amer: 22 mL/min — ABNORMAL LOW (ref >60)
Glucose, Bld: 131 mg/dL — ABNORMAL HIGH (ref 70–99)
Potassium: 3.9 mmol/L (ref 3.5–5.1)
Sodium: 137 mmol/L (ref 135–145)
Total Bilirubin: 0.4 mg/dL (ref 0.3–1.2)
Total Protein: 5.7 g/dL — ABNORMAL LOW (ref 6.5–8.1)

## 2019-02-10 LAB — GLUCOSE, CAPILLARY
Glucose-Capillary: 91 mg/dL (ref 70–99)
Glucose-Capillary: 99 mg/dL (ref 70–99)
Glucose-Capillary: 102 mg/dL — ABNORMAL HIGH (ref 70–99)
Glucose-Capillary: 130 mg/dL — ABNORMAL HIGH (ref 70–99)

## 2019-02-10 LAB — CBC
HCT: 28.2 % — ABNORMAL LOW (ref 39.0–52.0)
Hemoglobin: 9.1 g/dL — ABNORMAL LOW (ref 13.0–17.0)
MCH: 27.7 pg (ref 26.0–34.0)
MCHC: 32.3 g/dL (ref 30.0–36.0)
MCV: 86 fL (ref 80.0–100.0)
Platelets: 220 10*3/uL (ref 150–400)
RBC: 3.28 MIL/uL — ABNORMAL LOW (ref 4.22–5.81)
RDW: 16.5 % — ABNORMAL HIGH (ref 11.5–15.5)
WBC: 9.2 10*3/uL (ref 4.0–10.5)
nRBC: 0 % (ref 0.0–0.2)

## 2019-02-10 LAB — LDL CHOLESTEROL, DIRECT: Direct LDL: 72.5 mg/dL (ref 0–99)

## 2019-02-10 LAB — LIPASE, BLOOD: Lipase: 22 U/L (ref 11–51)

## 2019-02-10 LAB — APTT: aPTT: 84 seconds — ABNORMAL HIGH (ref 24–36)

## 2019-02-10 LAB — LIPID PANEL
Cholesterol: 177 mg/dL (ref 0–200)
HDL: 17 mg/dL — ABNORMAL LOW (ref >40)
LDL Cholesterol: UNDETERMINED mg/dL (ref 0–99)
Total CHOL/HDL Ratio: 10.4 RATIO
Triglycerides: 422 mg/dL — ABNORMAL HIGH (ref <150)
VLDL: UNDETERMINED mg/dL (ref 0–40)

## 2019-02-10 LAB — HEPARIN LEVEL (UNFRACTIONATED)
Heparin Unfractionated: 1.06 IU/mL (ref 0.30–0.70)
Heparin Unfractionated: 1.06 IU/mL — ABNORMAL HIGH (ref 0.30–0.70)

## 2019-02-10 LAB — LACTIC ACID, PLASMA: Lactic Acid, Venous: 3.7 mmol/L (ref 0.5–1.9)

## 2019-02-10 MED ORDER — SODIUM CHLORIDE 0.9 % IV SOLN
800.0000 mg | INTRAVENOUS | Status: DC
  Administered 2019-02-11: 800 mg via INTRAVENOUS
  Filled 2019-02-10 (×2): qty 16

## 2019-02-10 MED ORDER — CALCITONIN (SALMON) 200 UNIT/ML IJ SOLN
4.0000 [IU]/kg | Freq: Once | INTRAMUSCULAR | Status: AC
  Administered 2019-02-10: 396 [IU] via SUBCUTANEOUS
  Filled 2019-02-10: qty 1.98

## 2019-02-10 MED ORDER — TRAZODONE HCL 50 MG PO TABS
50.0000 mg | ORAL_TABLET | Freq: Once | ORAL | Status: AC
  Administered 2019-02-10: 50 mg via ORAL
  Filled 2019-02-10: qty 1

## 2019-02-10 MED ORDER — SODIUM CHLORIDE 0.9 % IV SOLN
30.0000 mg | Freq: Once | INTRAVENOUS | Status: AC
  Administered 2019-02-10: 30 mg via INTRAVENOUS
  Filled 2019-02-10: qty 10

## 2019-02-10 MED ORDER — FUROSEMIDE 10 MG/ML IJ SOLN
40.0000 mg | Freq: Once | INTRAMUSCULAR | Status: AC
  Administered 2019-02-10: 40 mg via INTRAVENOUS
  Filled 2019-02-10: qty 4

## 2019-02-10 NOTE — Progress Notes (Signed)
[facility] Progress Note  Patient: Eric Price KGU:[id number]   PCP: [name], MD DOB: 09-27-1951   DOA: 02/08/2019   DOS: 02/10/2019   Date of Service: the patient was seen and examined on 02/10/2019  Brief hospital course: Pt. with PMH of CAD s/p PCI, nonischemic cardiomyopathy, ICD placement, HTN, HLD, BPH, T2DM, gout, arterial thrombus s/p left BKA, HFpEF EF 40-45%, mycosis fungoides, and chronic anticoagulation ; admitted on 02/08/2019, presented with complaint of fatigue, was found to have hypercalcemia acute kidney injury. Currently further plan is continue currentTreatment.  Subjective: Feels about the same.  Continues to have severe shortness of breath.  No nausea vomiting.  Continues to have scrotal swelling.  Assessment and Plan:  Sepsis ([place]) Unknown etiology likely from UTI. History of enterococcus bacteremia So far no growth in blood cultures and urine cultures. Receiving IV fluid. Patient was already on IV daptomycin which she has been continuing without any missed.  Continue to complete the course. We will currently stop cefepime now that the cultures are negative.  Severe hypercalcemia. Acute kidney injury on chronic kidney disease stage III. Acute metabolic encephalopathy Unresponsive episode at home. Comes in with confusion. Unremarkable CT head Renal function from baseline 1.6 to currently worsening Serum calcium also increased.  Corrected serum calcium is 14. While the patient has sepsis also appears mildly volume overloaded. We will continue with IV fluids.    Treated with PRN IV Lasix with minimal improvement in serum calcium and actually worsening of serum creatinine. Will provide IV pamidronate and 1 dose of calcitonin and monitor serum calcium.    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 Palliative care consult for goals of care. Patient  currently wants to treat for what is treatable but wants to remain DNR.    History of depression Holding trazodone.    HTN (hypertension) Blood pressure marginally low.  Currently holding blood pressure medication.    HLD (hyperlipidemia) Holding Lipitor for now.    BPH (benign prostatic hyperplasia) Foley catheter insertion.    Controlled type 2 diabetes mellitus with stage 3 chronic kidney disease, with long-term current use of insulin (HCC) Holding home insulin regimen currently on sliding scale insulin.    Nonischemic cardiomyopathy ([place])   ICD (implantable cardioverter-defibrillator) in place   Chronic combined systolic and diastolic CHF (congestive heart failure) ([place]) Consider the patient can get volume overloaded. We will give low-dose Lasix. Monitor for now.    S/P BKA (below knee amputation), left ([place]) PT OT consulted.  Diet: Cardiac diet, speech consult DVT Prophylaxis: on therapeutic anticoagulation.  Advance goals of care discussion: DNR/DNI discussed with wife on the phone.  Plan is to transition to comfort care if patient does not improve tomorrow.  Family Communication: no family was present at bedside, at the time of interview.  Discussed with wife on the phone Opportunity was given to ask question and all questions were answered satisfactorily.   Disposition:  Discharge to pending.  Consultants: Palliative care Procedures: None  Scheduled Meds: . carvedilol  6.25 mg Oral BID WC  . docusate sodium  100 mg Oral BID  . feeding supplement (ENSURE ENLIVE)  237 mL Oral TID BM  . insulin aspart  0-5 Units Subcutaneous QHS  . insulin aspart  0-9 Units Subcutaneous TID WC  . levalbuterol  0.63 mg Nebulization BID  . multivitamin with minerals  1 tablet Oral Daily  . pantoprazole  40 mg Oral Daily  . prednisoLONE acetate  1 drop Both Eyes Daily  . sodium chloride flush  10-40 mL Intracatheter Q12H   Continuous Infusions: . sodium chloride 1,000  mL (02/10/19 1505)  . sodium chloride 10 mL/hr at 02/09/19 1800  . [START ON 02/11/2019] DAPTOmycin (CUBICIN)  IV    . heparin 1,400 Units/hr (02/10/19 0725)   PRN Meds: sodium chloride, acetaminophen **OR** acetaminophen, albuterol, ondansetron **OR** ondansetron (ZOFRAN) IV Antibiotics: Anti-infectives (From admission, onward)   Start     Dose/Rate Route Frequency Ordered Stop   02/11/19 2000  DAPTOmycin (CUBICIN) 800 mg in sodium chloride 0.9 % IVPB     800 mg 232 mL/hr over 30 Minutes Intravenous Every 48 hours 02/10/19 0245     02/09/19 2000  DAPTOmycin (CUBICIN) 800 mg in sodium chloride 0.9 % IVPB  Status:  Discontinued     800 mg 232 mL/hr over 30 Minutes Intravenous Daily 02/09/19 0936 02/10/19 0245   02/09/19 1800  ceFEPIme (MAXIPIME) 2 g in sodium chloride 0.9 % 100 mL IVPB  Status:  Discontinued     2 g 200 mL/hr over 30 Minutes Intravenous Every 24 hours 02/08/19 1733 02/10/19 0803   02/08/19 1615  ceFEPIme (MAXIPIME) 2 g in sodium chloride 0.9 % 100 mL IVPB     2 g 200 mL/hr over 30 Minutes Intravenous  Once 02/08/19 1606 02/08/19 1722   02/08/19 1615  metroNIDAZOLE (FLAGYL) IVPB 500 mg     500 mg 100 mL/hr over 60 Minutes Intravenous  Once 02/08/19 1606 02/08/19 1809   02/08/19 1615  vancomycin (VANCOCIN) IVPB 1000 mg/200 mL premix  Status:  Discontinued     1,000 mg 200 mL/hr over 60 Minutes Intravenous  Once 02/08/19 1606 02/08/19 1623       Objective: Physical Exam: Vitals:   02/09/19 2130 02/10/19 0546 02/10/19 0828 02/10/19 1456  BP: 118/71 116/65  (!) 117/50  Pulse: (!) 119 (!) 107  (!) 106  Resp: 17 19  18   Temp: 97.9 F (36.6 C) 97.9 F (36.6 C)  98 F (36.7 C)  TempSrc:    Oral  SpO2: 96% 96% 97% 100%  Weight:      Height:        Intake/Output Summary (Last 24 hours) at 02/10/2019 1904 Last data filed at 02/10/2019 1858 Gross per 24 hour  Intake 4549.11 ml  Output 1200 ml  Net 3349.11 ml   Filed Weights   02/08/19 1706 02/09/19 0500  Weight:  95.3 kg 99 kg   General: Alert, Awake and Oriented to Time, Place and Person. Appear in moderate distress, affect appropriate Eyes: PERRL, Conjunctiva normal ENT: Oral Mucosa clear moist. Neck: difficult to assess  JVD, no Abnormal Mass Or lumps Cardiovascular: S1 and S2 Present, no Murmur, Peripheral Pulses Present Respiratory: normal respiratory effort, Bilateral Air entry equal and Decreased, no use of accessory muscle, bilateral  Crackles, no wheezes Abdomen: Bowel Sound present, Soft and no tenderness, no hernia Skin: no redness, no Rash, no induration Extremities: left BKA right Pedal edema, no calf tenderness Neurologic: Grossly no focal neuro deficit. Bilaterally Equal motor strength  Data Reviewed: CBC: Recent Labs  Lab 02/08/19 1615 02/09/19 0333 02/10/19 0500  WBC 8.6 7.7 9.2  NEUTROABS 1.3*  --   --   HGB 9.6* 8.6* 9.1*  HCT 28.8* 26.7* 28.2*  MCV 85.5 84.5 86.0  PLT 280 232 950   Basic Metabolic Panel: Recent Labs  Lab 02/08/19 1615 02/08/19 2302 02/09/19 9326  02/09/19 2055 02/10/19 0946  NA 134* 135 136 135 137  K 4.5 4.2 3.7 4.0 3.9  CL 92* 97* 99 95* 97*  CO2 24 25 24 26 25   GLUCOSE 102* 101* 80 119* 131*  BUN 35* 33* 32* 34* 31*  CREATININE 2.93* 2.75* 2.57* 2.94* 2.77*  CALCIUM 13.6* 13.0* 11.7* 13.1* 12.8*  MG  --  1.8  --   --   --     Liver Function Tests: Recent Labs  Lab 02/08/19 1615 02/08/19 2302 02/09/19 0333 02/10/19 0946  AST 32 31 30 43*  ALT 19 18 16 20   ALKPHOS 102 95 89 92  BILITOT 0.5 0.7 0.4 0.4  PROT 6.2* 6.0* 5.4* 5.7*  ALBUMIN 3.3* 3.1* 2.9* 2.9*   Recent Labs  Lab 02/10/19 0946  LIPASE 22   No results for input(s): AMMONIA in the last 168 hours. Coagulation Profile: No results for input(s): INR, PROTIME in the last 168 hours. Cardiac Enzymes: Recent Labs  Lab 02/08/19 2302  CKTOTAL 268   BNP (last 3 results) Recent Labs    06/15/18 1644  PROBNP 17.0   CBG: Recent Labs  Lab 02/09/19 1632 02/09/19  2128 02/10/19 0728 02/10/19 1240 02/10/19 1611  GLUCAP 72 123* 91 99 102*   Studies: No results found.   Time spent: 35 minutes  Author: Berle Mull, MD [facility] 02/10/2019 7:04 PM  To reach On-call, see care teams to locate the attending and reach out to them via [email or web address]. If 7PM-7AM, please contact night-coverage If you still have difficulty reaching the attending provider, please page the [facility] (Director on Call) for [facility] on amion for assistance.

## 2019-02-10 NOTE — Progress Notes (Addendum)
NCM received message to call wife, at [phone].  NCM called this number , no answer, unable to leave vm due to messages being full.   11:31 Tomi Bamberger RN, BSN- NCM attempted to reach wife again vm states mailbox is full.   12:39 Tomi Bamberger RN, BSN - NCM called wife again, spoke with her and she states that MD is waiting on results from a test .  NCM informed her that MD and Palliative will be in touch with her after those results come in.  NCM was very sympathetic with her and stated to her that what she can do for spouse at this time is to just be there for him and make sure he gets the things he needs.  She will be waiting to hear from palliative and MD.

## 2019-02-10 NOTE — Progress Notes (Signed)
CRITICAL VALUE ALERT  Critical Value:  Lactic Acid 3.7  Date & Time Notied:  02/10/2019; 10:31  Provider Notified: [name]  Orders Received/Actions taken:

## 2019-02-10 NOTE — Progress Notes (Signed)
Daily Progress Note   Patient Name: Eric Price       Date: 02/10/2019 DOB: August 15, 1951  Age: 68 y.o. MRN#: [id number] Attending Physician: Lavina Hamman, MD Primary Care Physician: [name], MD Admit Date: 02/08/2019  Reason for Consultation/Follow-up: Establishing goals of care  Subjective/GOC: Patient awake, alert, and answers orientation questions appropriately. He is mildly short of breath. Denies pain.   Attempted to discuss diagnoses, interventions, and GOC. Although oriented, patient forgetful and does not engage in [place] discussion. PMT provider did explain recommendation for return home with hospice versus hospice facility. Patient would rather return home with hospice. He tells me to further discuss plan with his wife.   Attempted to discuss [place] with wife this afternoon. No answer and unable to leave voicemail due to full vm box. Discussed with [name], who spoke with wife this AM. Watchful waiting through the weekend. If no improvement, likely will pursue hospice facility placement. DNR now.  Length of Stay: 2  Current Medications: Scheduled Meds:  . carvedilol  6.25 mg Oral BID WC  . docusate sodium  100 mg Oral BID  . feeding supplement (ENSURE ENLIVE)  237 mL Oral TID BM  . insulin aspart  0-5 Units Subcutaneous QHS  . insulin aspart  0-9 Units Subcutaneous TID WC  . levalbuterol  0.63 mg Nebulization BID  . multivitamin with minerals  1 tablet Oral Daily  . pantoprazole  40 mg Oral Daily  . prednisoLONE acetate  1 drop Both Eyes Daily  . sodium chloride flush  10-40 mL Intracatheter Q12H    Continuous Infusions: . sodium chloride 1,000 mL (02/10/19 1505)  . sodium chloride 10 mL/hr at 02/09/19 1800  . [START ON 02/11/2019] DAPTOmycin (CUBICIN)  IV    .  heparin 1,400 Units/hr (02/10/19 0725)    PRN Meds: sodium chloride, acetaminophen **OR** acetaminophen, albuterol, ondansetron **OR** ondansetron (ZOFRAN) IV  Physical Exam Vitals signs and nursing note reviewed.  Constitutional:      General: He is awake.     Appearance: He is ill-appearing.  HENT:     Head: Normocephalic and atraumatic.  Pulmonary:     Effort: No tachypnea, accessory muscle usage or respiratory distress.  Neurological:     Mental Status: He is alert and oriented to person, place, and time.  Comments: Forgetful            Vital Signs: BP (!) 117/50 (BP Location: Left Arm)   Pulse (!) 106   Temp 98 F (36.7 C) (Oral)   Resp 18   Ht 5\' 8"  (1.727 m)   Wt 99 kg   SpO2 100%   BMI 33.19 kg/m  SpO2: SpO2: 100 % O2 Device: O2 Device: Room Air O2 Flow Rate:    Intake/output summary:   Intake/Output Summary (Last 24 hours) at 02/10/2019 1736 Last data filed at 02/10/2019 1705 Gross per 24 hour  Intake 3891.46 ml  Output 900 ml  Net 2991.46 ml   LBM: Last BM Date: 02/10/19 Baseline Weight: Weight: 95.3 kg Most recent weight: Weight: 99 kg       Palliative Assessment/Data: PPS 30%    Flowsheet Rows     Most Recent Value  Intake Tab  Referral Department  Hospitalist  Unit at Time of Referral  Med/Surg Unit  Palliative Care Primary Diagnosis  Cancer  Palliative Care Type  Return patient Palliative Care  Reason for referral  Clarify Goals of Care  Date first seen by Palliative Care  02/09/19  Clinical Assessment  Palliative Performance Scale Score  30%  Psychosocial & Spiritual Assessment  Palliative Care Outcomes  Patient/Family meeting held?  Yes  Who was at the meeting?  spoke with patient and wife via telephone  Palliative Care Outcomes  Provided end of life care assistance, Clarified goals of care, Provided psychosocial or spiritual support, ACP counseling assistance, Counseled regarding hospice      Patient Active Problem List    Diagnosis Date Noted  . Palliative care by specialist   . Goals of care, counseling/discussion   . Hypercalcemia   . Sepsis ([place]) 01/23/2019  . Suspected Covid-19 Virus Infection 01/23/2019  . HCAP (healthcare-associated pneumonia) 01/23/2019  . CAP (community acquired pneumonia) 11/23/2018  . Dyslipidemia 08/31/2018  . Leukocytosis 08/24/2018  . Tachycardia 08/24/2018  . Tremor of both hands 08/24/2018  . Chronic anemia 08/24/2018  . Diabetic neuropathy ([place]) 08/24/2018  . GERD (gastroesophageal reflux disease) 08/24/2018  . Physical deconditioning 08/24/2018  . AKI (acute kidney injury) ([place]) 08/23/2018  . Abdominal distension 06/09/2018  . Electrolyte imbalance 06/09/2018  . Scrotal pain 06/09/2018  . Scrotal swelling 06/09/2018  . Symptom of wheezing 06/09/2018  . Urinary hesitancy 06/09/2018  . Muscle tension dysphonia 03/22/2018  . CKD (chronic kidney disease) stage 3, GFR 30-59 ml/min (HCC) 03/08/2018  . SIRS (systemic inflammatory response syndrome) ([place]) 03/08/2018  . Acute encephalopathy 03/08/2018  . Acute kidney injury ([place])   . Post-nasal drainage 01/19/2018  . Seasonal allergic rhinitis due to pollen 01/19/2018  . Bacteremia due to Enterococcus 07/08/2017  . Mucositis due to chemotherapy 07/08/2017  . Febrile neutropenia ([place]) 07/04/2017  . Drug-induced constipation 06/24/2017  . Thrush, oral 06/24/2017  . 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 De Quervain's tenosynovitis, left 12/29/2016  . Chronic combined systolic and diastolic CHF (congestive heart failure) ([place]) 07/17/2016  . Nonsustained ventricular tachycardia ([place]) 07/17/2016  . PAD (peripheral artery disease) ([place]) 07/17/2016  . Eczema 06/04/2016  . SI (sacroiliac) joint dysfunction 01/21/2016  . Hip pain, left 12/23/2015  . History of hypercoagulable state 12/23/2015  . Degenerative arthritis of right knee 10/28/2015  . Other specified personal risk factors, not elsewhere classified  08/27/2015  . ICD (implantable cardioverter-defibrillator) in place 08/27/2015  . Phantom limb pain ([place]) 05/24/2015  . Memory loss   . Status post  below knee amputation of left lower extremity ([place]) 03/08/2015  . S/P BKA (below knee amputation), left ([place]) 03/08/2015  . Acquired absence of left leg below knee ([place]) 03/08/2015  . Ischemic toe 03/05/2015  . Pain in joint, ankle and foot 02/28/2015  . Cold sensation of skin-Left foot 02/28/2015  . Other disturbances of skin sensation 02/28/2015  . Nonischemic cardiomyopathy ([place]) 02/22/2015  . Coronary artery disease involving native coronary artery of native heart without angina pectoris 02/22/2015  . Embolic disease of toe ([place]) 02/22/2015  . Ischemic ulcer of toe of left foot ([place]) 02/22/2015  . 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 ([place]) 02/22/2015  . Diabetic foot ulcer ([place]) 02/21/2015  . Critical lower limb ischemia 02/19/2015  . Controlled type 2 diabetes mellitus with stage 3 chronic kidney disease, with long-term current use of insulin ([place]) 09/19/2014  . Hereditary and idiopathic peripheral neuropathy 09/07/2014  . 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 Primary localized osteoarthrosis, lower leg 06/19/2014  . Low back pain 05/28/2014  . Acromioclavicular joint arthritis 05/28/2014  . Dental decay 05/25/2014  . Major depressive disorder, recurrent episode ([place]) 12/11/2013  . Long-term use of immunosuppressant medication 12/04/2013  . Obesity (BMI 30-39.9) 10/31/2013  . Chest pain 10/20/2013  . Stable angina ([place]) 10/19/2013  . Type 2 diabetes mellitus with hyperglycemia ([place]) 10/19/2013  . BPH (benign prostatic hyperplasia) 10/11/2013  . Enlarged prostate without lower urinary tract symptoms ([place]) 10/11/2013  . Dyspnea 06/30/2013  . Depression 01/19/2013  . 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 History of depression 07/13/2012  . HTN (hypertension) 07/13/2012  . HLD (hyperlipidemia) 07/13/2012  . Personal history of  other mental and behavioral disorders 07/13/2012  . Type 2 diabetes mellitus with diabetic polyneuropathy ([place]) 07/13/2012    Palliative Care Assessment & Plan   Patient Profile: 68 y.o. male  with past medical history of CAD s/p PCI, nonischemic cardiomyopathy, ICD placement, HTN, HLD, BPH, T2DM, gout, arterial thrombus s/p left BKA, HFpEF EF 40-45%, mycosis fungoides, and chronic anticoagulation admitted on 02/08/2019 with fatigue, poor oral intake. Patient followed by [facility] for mycosis fungoides. Recently deemed a poor candidate for further chemotherapy and was discharge home with hospice services April 2020. Recent [facility] admission for bacteremia discharged with PICC/IV antibiotics x6 weeks. In ED, patient with severe hypercalcemia, AKI on CKD, sepsis, and mild fluid overload. Palliative medicine consultation for goals of care.   Assessment: Mycosis fungoides Severe hypercalcemia Acute kidney injury on CKD Acute metabolic encephalopathy Nonischemic cardiomyopathy s/p ICD Combined CHF S/p BKA Weakness/deconditioning  Recommendations/Plan:  [name] discussed [place] with wife this AM. DNR now. If no improvement through the weekend, wife considering hospice facility placement.   Continue current plan of care and interventions.   PMT provider attempted to f/u with wife this afternoon. No answer and unable to leave vm. PMT provider will f/u with patient/wife Monday.    Code Status: DNR   Code Status Orders  (From admission, onward)         Start     Ordered   02/10/19 1223  Do not attempt resuscitation (DNR)  Continuous    Question Answer Comment  In the event of cardiac or respiratory ARREST Do not call a "code blue"   In the event of cardiac or respiratory ARREST Do not perform Intubation, CPR, defibrillation or ACLS   In the event of cardiac or respiratory ARREST Use medication by any route, position, wound care, and other measures to relive pain and suffering.  May use  oxygen, suction and manual treatment of airway obstruction as needed for comfort.      02/10/19 1223        Code Status History    Date Active Date Inactive Code Status Order ID Comments User Context   02/08/2019 2329 02/10/2019 1223 Full Code [id number]  Schorr, Rhetta Mura, NP Inpatient   02/08/2019 1925 02/08/2019 2329 DNR [id number]  Lavina Hamman, MD Inpatient   02/08/2019 1847 02/08/2019 1925 DNR [id number]  Lavina Hamman, MD Inpatient   01/24/2019 0013 01/27/2019 2021 DNR [id number]  Toy Baker, MD ED   11/23/2018 1749 11/27/2018 1832 Full Code [id number]  Kayleen Memos, DO ED   08/23/2018 2109 08/26/2018 1501 Full Code [id number]  Shela Leff, MD Inpatient   03/08/2018 0339 03/14/2018 2040 Full Code [id number]  Vianne Bulls, MD ED   08/27/2015 2008 08/28/2015 1653 Full Code [id number]  [name], MD Inpatient   03/08/2015 1745 03/16/2015 1214 Full Code [id number]  Cathlyn Parsons, PA-C Inpatient   03/05/2015 1258 03/08/2015 1745 Full Code [id number]  Gabriel Earing, PA-C Inpatient   02/21/2015 1152 02/22/2015 1612 Full Code [id number]  [name], MD Inpatient   02/21/2015 0348 02/21/2015 1152 Full Code [id number]  Lily Kocher, MD Inpatient   10/19/2013 2318 10/20/2013 2034 Full Code [id number]  Orson Eva, MD Inpatient       Prognosis:  Poor prognosis  Discharge Planning:  To Be Determined: home with hospice versus hospice facility placement  Care plan was discussed with patient, [name]. Attempted to contact wife. No answer and VM box full.   Thank you for allowing the Palliative Medicine Team to assist in the care of this patient.   Time In: 1130- 1450- Time Out: 1150 1500 Total Time 30 Prolonged Time Billed  no      Greater than 50%  of this time was spent counseling and coordinating care related to the above assessment and plan.  Ihor Dow, FNP-C Palliative Medicine Team  Phone: [phone] Fax: [phone]  Please contact Palliative Medicine Team  phone at [phone] for questions and concerns.

## 2019-02-10 NOTE — Progress Notes (Signed)
Notified by Eric Price in main lab that heparin levels will not be able to obtained due to pts blood being lipemic. Will notify day shift RN in hand off.

## 2019-02-10 NOTE — Progress Notes (Signed)
ANTICOAGULATION CONSULT NOTE - [facility] for heparin Indication: h/o VTE  Allergies  Allergen Reactions  . Morphine Anaphylaxis and Shortness Of Breath  . Morphine And Related Anaphylaxis and Shortness Of Breath    "took my breath away"  . Oxycodone Anaphylaxis, Shortness Of Breath, Anxiety and Other (See Comments)    Pt stated, "It makes me climbs walls; fight wars"  . Daptomycin Other (See Comments)    "too strong; has drained the life out of him"  . Robaxin [Methocarbamol] Other (See Comments)    "went out of his mind"    Patient Measurements: Height: 5' 8"  (172.7 cm) Weight: 218 lb 4.1 oz (99 kg) IBW/kg (Calculated) : 68.4 Heparin Dosing Weight: 99kg  Vital Signs: Temp: 97.9 F (36.6 C) (05/08 0546) BP: 116/65 (05/08 0546) Pulse Rate: 107 (05/08 0546)  Labs: Recent Labs    02/08/19 1615 02/08/19 2302 02/09/19 0333 02/09/19 2055 02/10/19 0500  HGB 9.6*  --  8.6*  --  9.1*  HCT 28.8*  --  26.7*  --  28.2*  PLT 280  --  232  --  220  APTT  --   --   --  73* 84*  HEPARINUNFRC  --   --   --  1.06 1.06*  CREATININE 2.93* 2.75* 2.57* 2.94*  --   CKTOTAL  --  268  --   --   --     Estimated Creatinine Clearance: 27.4 mL/min (A) (by C-G formula based on SCr of 2.94 mg/dL (H)).   Medical History: Past Medical History:  Diagnosis Date  . Arthritis    knees, shoulder, hands   . Chronic combined systolic and diastolic CHF (congestive heart failure) ([place]) 07/17/2016  . Coronary artery disease   . Critical lower limb ischemia   . Depression   . Hyperlipidemia   . Hypertension   . 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   . Nonischemic cardiomyopathy ([place])   . Peripheral vascular disease ([place])   . Pneumonia 2013   hosp. - [facility] x1 week   . Renal insufficiency 03/08/2018  . Shortness of breath dyspnea    related to pain currently  . SIRS (systemic inflammatory response syndrome) ([place]) 03/2018   . Sleep apnea    10-20 yrs. ago, states he used CPAP, not needed anymore.   . Type II diabetes mellitus ([place])     Assessment: 69 yo male with h/o Arterial thrombus on chronic anticoagulation and mycosis fungoides and recent enterococcal bacteremia on Iv daptomycin. Patient supposedly on rivaroxaban, however home medication list not finalized at this point. MD wishes to change patient to heparin drip while off of rivaroxaban.Last dose of DOAC was 5/5 at 1800.   Heparin level skewed by recent DOAC use (5/5) at 1.06, aPTT therapeutic at 84 seconds.    Goal of Therapy:  Heparin level 0.3-0.7 units/ml  APTT 66-102 Monitor platelets by anticoagulation protocol: Yes   Plan:  -Continue heparin 1400 units/hr -Daily heparin level and aPTT until correlating   Jaron Czarnecki A. Levada Dy, PharmD, [place] Please utilize Amion for appropriate phone number to reach the unit pharmacist ([place])   02/10/2019

## 2019-02-10 NOTE — Progress Notes (Signed)
Pt c/o difficulty falling asleep. Requested medication to help fall asleep. On call NP, Bodenheimer made aware. Will continue to monitor and treat per MD orders.

## 2019-02-11 LAB — COMPREHENSIVE METABOLIC PANEL
ALT: 27 U/L (ref 0–44)
AST: 46 U/L — ABNORMAL HIGH (ref 15–41)
Albumin: 3 g/dL — ABNORMAL LOW (ref 3.5–5.0)
Alkaline Phosphatase: 94 U/L (ref 38–126)
Anion gap: 11 (ref 5–15)
BUN: 29 mg/dL — ABNORMAL HIGH (ref 8–23)
CO2: 26 mmol/L (ref 22–32)
Calcium: 12.6 mg/dL — ABNORMAL HIGH (ref 8.9–10.3)
Chloride: 100 mmol/L (ref 98–111)
Creatinine, Ser: 2.57 mg/dL — ABNORMAL HIGH (ref 0.61–1.24)
GFR calc Af Amer: 29 mL/min — ABNORMAL LOW (ref >60)
GFR calc non Af Amer: 25 mL/min — ABNORMAL LOW (ref >60)
Glucose, Bld: 106 mg/dL — ABNORMAL HIGH (ref 70–99)
Potassium: 3.9 mmol/L (ref 3.5–5.1)
Sodium: 137 mmol/L (ref 135–145)
Total Bilirubin: 0.6 mg/dL (ref 0.3–1.2)
Total Protein: 5.8 g/dL — ABNORMAL LOW (ref 6.5–8.1)

## 2019-02-11 LAB — GLUCOSE, CAPILLARY
Glucose-Capillary: 103 mg/dL — ABNORMAL HIGH (ref 70–99)
Glucose-Capillary: 117 mg/dL — ABNORMAL HIGH (ref 70–99)
Glucose-Capillary: 126 mg/dL — ABNORMAL HIGH (ref 70–99)
Glucose-Capillary: 147 mg/dL — ABNORMAL HIGH (ref 70–99)

## 2019-02-11 LAB — CBC WITH DIFFERENTIAL/PLATELET
Abs Immature Granulocytes: 0.05 10*3/uL (ref 0.00–0.07)
Basophils Absolute: 0.1 10*3/uL (ref 0.0–0.1)
Basophils Relative: 1 %
Eosinophils Absolute: 5.9 10*3/uL — ABNORMAL HIGH (ref 0.0–0.5)
Eosinophils Relative: 58 %
HCT: 28 % — ABNORMAL LOW (ref 39.0–52.0)
Hemoglobin: 9.1 g/dL — ABNORMAL LOW (ref 13.0–17.0)
Immature Granulocytes: 1 %
Lymphocytes Relative: 13 %
Lymphs Abs: 1.4 10*3/uL (ref 0.7–4.0)
MCH: 28 pg (ref 26.0–34.0)
MCHC: 32.5 g/dL (ref 30.0–36.0)
MCV: 86.2 fL (ref 80.0–100.0)
Monocytes Absolute: 1.5 10*3/uL — ABNORMAL HIGH (ref 0.1–1.0)
Monocytes Relative: 15 %
Neutro Abs: 1.2 10*3/uL — ABNORMAL LOW (ref 1.7–7.7)
Neutrophils Relative %: 12 %
Platelets: 216 10*3/uL (ref 150–400)
RBC: 3.25 MIL/uL — ABNORMAL LOW (ref 4.22–5.81)
RDW: 16.8 % — ABNORMAL HIGH (ref 11.5–15.5)
WBC: 10.1 10*3/uL (ref 4.0–10.5)
nRBC: 0 % (ref 0.0–0.2)

## 2019-02-11 LAB — LACTIC ACID, PLASMA: Lactic Acid, Venous: 3.6 mmol/L (ref 0.5–1.9)

## 2019-02-11 LAB — HEPARIN LEVEL (UNFRACTIONATED): Heparin Unfractionated: 0.5 IU/mL (ref 0.30–0.70)

## 2019-02-11 LAB — APTT: aPTT: 78 seconds — ABNORMAL HIGH (ref 24–36)

## 2019-02-11 LAB — MAGNESIUM: Magnesium: 1.8 mg/dL (ref 1.7–2.4)

## 2019-02-11 MED ORDER — FUROSEMIDE 10 MG/ML IJ SOLN
40.0000 mg | Freq: Once | INTRAMUSCULAR | Status: AC
  Administered 2019-02-11: 40 mg via INTRAVENOUS
  Filled 2019-02-11: qty 4

## 2019-02-11 MED ORDER — FENTANYL CITRATE (PF) 100 MCG/2ML IJ SOLN
25.0000 ug | INTRAMUSCULAR | Status: DC | PRN
  Administered 2019-02-11: 25 ug via INTRAVENOUS
  Administered 2019-02-12 (×3): 50 ug via INTRAVENOUS
  Filled 2019-02-11 (×4): qty 2

## 2019-02-11 MED ORDER — CARVEDILOL 3.125 MG PO TABS: 3.1250 mg | ORAL_TABLET | Freq: Two times a day (BID) | ORAL | Status: DC

## 2019-02-11 MED ORDER — LORAZEPAM 2 MG/ML IJ SOLN
0.5000 mg | Freq: Four times a day (QID) | INTRAMUSCULAR | Status: DC | PRN
  Administered 2019-02-12 – 2019-02-13 (×2): 0.5 mg via INTRAVENOUS
  Filled 2019-02-11 (×2): qty 1

## 2019-02-11 NOTE — Progress Notes (Signed)
Lactic acid 3.6. NP on call notified.

## 2019-02-11 NOTE — Progress Notes (Signed)
ANTICOAGULATION CONSULT NOTE - [facility] for heparin Indication: h/o VTE  Allergies  Allergen Reactions  . Morphine Anaphylaxis and Shortness Of Breath  . Morphine And Related Anaphylaxis and Shortness Of Breath    "took my breath away"  . Oxycodone Anaphylaxis, Shortness Of Breath, Anxiety and Other (See Comments)    Pt stated, "It makes me climbs walls; fight wars"  . Daptomycin Other (See Comments)    "too strong; has drained the life out of him"  . Robaxin [Methocarbamol] Other (See Comments)    "went out of his mind"    Patient Measurements: Height: 5' 8"  (172.7 cm) Weight: 220 lb 10.9 oz (100.1 kg) IBW/kg (Calculated) : 68.4 Heparin Dosing Weight: 99kg  Vital Signs: Temp: 98.7 F (37.1 C) (05/09 0627) Temp Source: Oral (05/08 2148) BP: 124/65 (05/09 0627) Pulse Rate: 108 (05/09 0627)  Labs: Recent Labs    02/08/19 2302 02/09/19 0333 02/09/19 2055 02/10/19 0500 02/10/19 0946 02/11/19 0345  HGB  --  8.6*  --  9.1*  --  9.1*  HCT  --  26.7*  --  28.2*  --  28.0*  PLT  --  232  --  220  --  216  APTT  --   --  73* 84*  --  78*  HEPARINUNFRC  --   --  1.06 1.06*  --  0.50  CREATININE 2.75* 2.57* 2.94*  --  2.77* 2.57*  CKTOTAL 268  --   --   --   --   --     Estimated Creatinine Clearance: 31.6 mL/min (A) (by C-G formula based on SCr of 2.57 mg/dL (H)).   Medical History: Past Medical History:  Diagnosis Date  . Arthritis    knees, shoulder, hands   . Chronic combined systolic and diastolic CHF (congestive heart failure) ([place]) 07/17/2016  . Coronary artery disease   . Critical lower limb ischemia   . Depression   . Hyperlipidemia   . Hypertension   . 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   . Nonischemic cardiomyopathy ([place])   . Peripheral vascular disease ([place])   . Pneumonia 2013   hosp. - [facility] x1 week   . Renal insufficiency 03/08/2018  . Shortness of breath dyspnea    related  to pain currently  . SIRS (systemic inflammatory response syndrome) ([place]) 03/2018  . Sleep apnea    10-20 yrs. ago, states he used CPAP, not needed anymore.   . 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 Patient supposedly on rivaroxaban, however home medication list not finalized at this point. MD wishes to change patient to heparin drip while off of rivaroxaban. Last dose of DOAC was 5/5 at 1800.    5/9 Update: Heparin level now correlating (DOAC last dose 4 days ago), and therapeutic at 0.50, aPTT therapeutic as well at 78. Will use heparin levels only going forward. CBC stable with hgb 9.1 and pltc 216  Goal of Therapy:  Heparin level 0.3-0.7 units/ml  APTT 66-102 Monitor platelets by anticoagulation protocol: Yes   Plan:  -Continue heparin 1400 units/hr -Daily heparin level and CBC -F/u GOC plans  Thank you for involving pharmacy in this patient's care.  Janae Bridgeman, PharmD PGY1 Pharmacy Resident Phone: [phone] 02/11/2019 8:00 AM

## 2019-02-11 NOTE — Progress Notes (Signed)
[facility] Progress Note  Patient: Eric Price [id number]   PCP: [name], MD DOB: 24-Dec-1950   DOA: 02/08/2019   DOS: 02/11/2019   Date of Service: the patient was seen and examined on 02/11/2019  Brief hospital course: Pt. with PMH of CAD s/p PCI, nonischemic cardiomyopathy, ICD placement, HTN, HLD, BPH, T2DM, gout, arterial thrombus s/p left BKA, HFpEF EF 40-45%, mycosis fungoides, and chronic anticoagulation ; admitted on 02/08/2019, presented with complaint of fatigue, was found to have hypercalcemia acute kidney injury. Currently further plan is continue currentTreatment.  Subjective: Patient is in and out of orientation.  Is able to talk to me clearly but the same conversation will go back to sleep and will not follow any commands.  No nausea no vomiting no fever no chills.  Appears to be in severe shortness of breath.  Assessment and Plan:  Sepsis ([place]) ruled out History of enterococcus bacteremia So far no growth in blood cultures and urine cultures. Receiving IV fluid. Patient was already on IV daptomycin which she has been continuing without any missed.  Continue to complete the course. We will currently stop cefepime now that the cultures are negative.  Severe hypercalcemia. Acute kidney injury on chronic kidney disease stage III. Acute metabolic encephalopathy Unresponsive episode at home. Comes in with confusion. Unremarkable CT head Renal function from baseline 1.6 to currently worsening Serum calcium also increased.  Corrected serum calcium is 14. While the patient has sepsis also appears mildly volume overloaded. We will continue with IV fluids.    Treated with PRN IV Lasix with minimal improvement in serum calcium and actually worsening of serum creatinine. Patient was IV pamidronate and 1 dose of calcitonin without any improvement in serum calcium. Continues to report severe shortness of breath. Discussed with wife regarding prognosis and goals of  care. She would choose comfort care as well as residential hospice. Discussed with palliative care we will start the patient on fentanyl and Ativan as needed. Also provide 1 dose of Lasix.    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 Palliative care consult for goals of care.  She does not there when    History of depression Holding trazodone.    HTN (hypertension) Blood pressure marginally low.  Currently holding blood pressure medication.    HLD (hyperlipidemia) Holding Lipitor for now.    BPH (benign prostatic hyperplasia) Monitor    Controlled type 2 diabetes mellitus with stage 3 chronic kidney disease, with long-term current use of insulin (HCC) Holding home insulin regimen currently on sliding scale insulin.    Nonischemic cardiomyopathy ([place])   ICD (implantable cardioverter-defibrillator) in place   Chronic combined systolic and diastolic CHF (congestive heart failure) ([place]) Consider the patient can get volume overloaded. We will give low-dose Lasix. Monitor for now.    S/P BKA (below knee amputation), left ([place]) PT OT consulted.  Diet: Cardiac diet, speech consult DVT Prophylaxis: on therapeutic anticoagulation.  Advance goals of care discussion: DNR/DNI discussed with wife on the phone.  Plan is to transition to comfort care if patient does not improve tomorrow.  Family Communication: no family was present at bedside, at the time of interview.  Discussed with wife on the phone Opportunity was given to ask question and all questions were answered satisfactorily.   Disposition:  Discharge to pending.  Consultants: Palliative care Procedures: None  Scheduled Meds: . docusate sodium  100 mg Oral BID  .  feeding supplement (ENSURE ENLIVE)  237 mL Oral TID BM  . levalbuterol  0.63 mg Nebulization BID  . multivitamin with minerals  1 tablet Oral  Daily  . pantoprazole  40 mg Oral Daily  . prednisoLONE acetate  1 drop Both Eyes Daily  . sodium chloride flush  10-40 mL Intracatheter Q12H   Continuous Infusions: . sodium chloride 10 mL/hr at 02/09/19 1800  . DAPTOmycin (CUBICIN)  IV     PRN Meds: sodium chloride, acetaminophen **OR** acetaminophen, albuterol, fentaNYL (SUBLIMAZE) injection, LORazepam, ondansetron **OR** ondansetron (ZOFRAN) IV Antibiotics: Anti-infectives (From admission, onward)   Start     Dose/Rate Route Frequency Ordered Stop   02/11/19 2000  DAPTOmycin (CUBICIN) 800 mg in sodium chloride 0.9 % IVPB     800 mg 232 mL/hr over 30 Minutes Intravenous Every 48 hours 02/10/19 0245     02/09/19 2000  DAPTOmycin (CUBICIN) 800 mg in sodium chloride 0.9 % IVPB  Status:  Discontinued     800 mg 232 mL/hr over 30 Minutes Intravenous Daily 02/09/19 0936 02/10/19 0245   02/09/19 1800  ceFEPIme (MAXIPIME) 2 g in sodium chloride 0.9 % 100 mL IVPB  Status:  Discontinued     2 g 200 mL/hr over 30 Minutes Intravenous Every 24 hours 02/08/19 1733 02/10/19 0803   02/08/19 1615  ceFEPIme (MAXIPIME) 2 g in sodium chloride 0.9 % 100 mL IVPB     2 g 200 mL/hr over 30 Minutes Intravenous  Once 02/08/19 1606 02/08/19 1722   02/08/19 1615  metroNIDAZOLE (FLAGYL) IVPB 500 mg     500 mg 100 mL/hr over 60 Minutes Intravenous  Once 02/08/19 1606 02/08/19 1809   02/08/19 1615  vancomycin (VANCOCIN) IVPB 1000 mg/200 mL premix  Status:  Discontinued     1,000 mg 200 mL/hr over 60 Minutes Intravenous  Once 02/08/19 1606 02/08/19 1623       Objective: Physical Exam: Vitals:   02/11/19 0823 02/11/19 1102 02/11/19 1123 02/11/19 1510  BP:  116/77 114/75 106/67  Pulse:  (!) 108 (!) 108 (!) 107  Resp:    18  Temp:   98 F (36.7 C) 98.3 F (36.8 C)  TempSrc:   Oral Oral  SpO2: 93% 99% 97% 96%  Weight:      Height:        Intake/Output Summary (Last 24 hours) at 02/11/2019 1831 Last data filed at 02/11/2019 1814 Gross per 24 hour   Intake 3610.99 ml  Output 3000 ml  Net 610.99 ml   Filed Weights   02/08/19 1706 02/09/19 0500 02/11/19 0500  Weight: 95.3 kg 99 kg 100.1 kg   General: Alert, Awake and Oriented to Time, Place and Person. Appear in moderate distress, affect appropriate Eyes: PERRL, Conjunctiva normal ENT: Oral Mucosa clear moist. Neck: difficult to assess  JVD, no Abnormal Mass Or lumps Cardiovascular: S1 and S2 Present, no Murmur, Peripheral Pulses Present Respiratory: normal respiratory effort, Bilateral Air entry equal and Decreased, no use of accessory muscle, bilateral  Crackles, no wheezes Abdomen: Bowel Sound present, Soft and no tenderness, no hernia Skin: no redness, no Rash, no induration Extremities: left BKA right Pedal edema, no calf tenderness Neurologic: Grossly no focal neuro deficit. Bilaterally Equal motor strength  Data Reviewed: CBC: Recent Labs  Lab 02/08/19 1615 02/09/19 0333 02/10/19 0500 02/11/19 0345  WBC 8.6 7.7 9.2 10.1  NEUTROABS 1.3*  --   --  1.2*  HGB 9.6* 8.6* 9.1* 9.1*  HCT 28.8* 26.7* 28.2* 28.0*  MCV 85.5 84.5 86.0 86.2  PLT 280 232 220 409   Basic Metabolic Panel: Recent Labs  Lab 02/08/19 2302 02/09/19 0333 02/09/19 2055 02/10/19 0946 02/11/19 0345  NA 135 136 135 137 137  K 4.2 3.7 4.0 3.9 3.9  CL 97* 99 95* 97* 100  CO2 25 24 26 25 26   GLUCOSE 101* 80 119* 131* 106*  BUN 33* 32* 34* 31* 29*  CREATININE 2.75* 2.57* 2.94* 2.77* 2.57*  CALCIUM 13.0* 11.7* 13.1* 12.8* 12.6*  MG 1.8  --   --   --  1.8    Liver Function Tests: Recent Labs  Lab 02/08/19 1615 02/08/19 2302 02/09/19 0333 02/10/19 0946 02/11/19 0345  AST 32 31 30 43* 46*  ALT 19 18 16 20 27   ALKPHOS 102 95 89 92 94  BILITOT 0.5 0.7 0.4 0.4 0.6  PROT 6.2* 6.0* 5.4* 5.7* 5.8*  ALBUMIN 3.3* 3.1* 2.9* 2.9* 3.0*   Recent Labs  Lab 02/10/19 0946  LIPASE 22   No results for input(s): AMMONIA in the last 168 hours. Coagulation Profile: No results for input(s): INR,  PROTIME in the last 168 hours. Cardiac Enzymes: Recent Labs  Lab 02/08/19 2302  CKTOTAL 268   BNP (last 3 results) Recent Labs    06/15/18 1644  PROBNP 17.0   CBG: Recent Labs  Lab 02/10/19 1611 02/10/19 2204 02/11/19 0801 02/11/19 1153 02/11/19 1639  GLUCAP 102* 130* 103* 117* 126*   Studies: No results found.   Time spent: 35 minutes  Author: Berle Mull, MD [facility] 02/11/2019 6:31 PM  To reach On-call, see care teams to locate the attending and reach out to them via [email or web address]. If 7PM-7AM, please contact night-coverage If you still have difficulty reaching the attending provider, please page the [facility] (Director on Call) for [facility] on amion for assistance.

## 2019-02-12 LAB — BASIC METABOLIC PANEL
Anion gap: 15 (ref 5–15)
BUN: 24 mg/dL — ABNORMAL HIGH (ref 8–23)
CO2: 25 mmol/L (ref 22–32)
Calcium: 12.8 mg/dL — ABNORMAL HIGH (ref 8.9–10.3)
Chloride: 94 mmol/L — ABNORMAL LOW (ref 98–111)
Creatinine, Ser: 2.32 mg/dL — ABNORMAL HIGH (ref 0.61–1.24)
GFR calc Af Amer: 32 mL/min — ABNORMAL LOW (ref >60)
GFR calc non Af Amer: 28 mL/min — ABNORMAL LOW (ref >60)
Glucose, Bld: 95 mg/dL (ref 70–99)
Potassium: 3.9 mmol/L (ref 3.5–5.1)
Sodium: 134 mmol/L — ABNORMAL LOW (ref 135–145)

## 2019-02-12 MED ORDER — CARVEDILOL 3.125 MG PO TABS
3.1250 mg | ORAL_TABLET | Freq: Two times a day (BID) | ORAL | Status: DC
  Administered 2019-02-12 – 2019-02-13 (×3): 3.125 mg via ORAL
  Filled 2019-02-12 (×4): qty 1

## 2019-02-12 MED ORDER — ALBUTEROL SULFATE (2.5 MG/3ML) 0.083% IN NEBU: 2.5000 mg | INHALATION_SOLUTION | Freq: Four times a day (QID) | RESPIRATORY_TRACT | Status: DC | PRN

## 2019-02-12 MED ORDER — HYDROMORPHONE HCL 1 MG/ML IJ SOLN
0.5000 mg | Freq: Three times a day (TID) | INTRAMUSCULAR | Status: DC
  Administered 2019-02-12: 0.5 mg via INTRAVENOUS
  Filled 2019-02-12: qty 0.5

## 2019-02-12 MED ORDER — LORAZEPAM 2 MG/ML IJ SOLN
0.5000 mg | Freq: Three times a day (TID) | INTRAMUSCULAR | Status: DC
  Administered 2019-02-12 – 2019-02-14 (×5): 0.5 mg via INTRAVENOUS
  Filled 2019-02-12 (×5): qty 1

## 2019-02-12 MED ORDER — HYDROMORPHONE HCL 1 MG/ML IJ SOLN
0.5000 mg | INTRAMUSCULAR | Status: DC | PRN
  Administered 2019-02-12: 0.5 mg via INTRAVENOUS
  Filled 2019-02-12: qty 0.5

## 2019-02-12 MED ORDER — LORAZEPAM 2 MG/ML IJ SOLN: 0.5000 mg | Freq: Two times a day (BID) | INTRAMUSCULAR | Status: DC

## 2019-02-12 MED ORDER — HYDROMORPHONE HCL 1 MG/ML IJ SOLN
0.5000 mg | Freq: Four times a day (QID) | INTRAMUSCULAR | Status: DC
  Administered 2019-02-13 – 2019-02-14 (×5): 0.5 mg via INTRAVENOUS
  Filled 2019-02-12 (×5): qty 0.5

## 2019-02-12 MED ORDER — FUROSEMIDE 80 MG PO TABS
80.0000 mg | ORAL_TABLET | Freq: Every day | ORAL | Status: DC
  Administered 2019-02-12 – 2019-02-14 (×3): 80 mg via ORAL
  Filled 2019-02-12 (×3): qty 1

## 2019-02-12 NOTE — Progress Notes (Signed)
[facility] Progress Note  Patient: Eric Price [id number]:[id number]   PCP: [name], MD DOB: October 22, 1950   DOA: 02/08/2019   DOS: 02/12/2019   Date of Service: the patient was seen and examined on 02/12/2019  Brief hospital course: Pt. with PMH of CAD s/p PCI, nonischemic cardiomyopathy, ICD placement, HTN, HLD, BPH, T2DM, gout, arterial thrombus s/p left BKA, HFpEF EF 40-45%, mycosis fungoides, and chronic anticoagulation ; admitted on 02/08/2019, presented with complaint of fatigue, was found to have hypercalcemia acute kidney injury. Currently further plan is continue currentTreatment.  Subjective: Continues to report severe shortness of breath continues to have lower abdominal pain.  Scrotal swelling.  No nausea no vomiting no fever no chills.  Assessment and Plan:  Sepsis ([place]) ruled out History of enterococcus bacteremia So far no growth in blood cultures and urine cultures. Receiving IV fluid. Patient was already on IV daptomycin which she has been continuing without any missed.  Continue to complete the course for now We will currently stop cefepime now that the cultures are negative.  Severe hypercalcemia. Acute kidney injury on chronic kidney disease stage III. Acute metabolic encephalopathy Unresponsive episode at home. Comes in with confusion. Unremarkable CT head Renal function from baseline 1.6 to currently worsening Serum calcium also increased.  Corrected serum calcium is 14. While the patient has sepsis also appears mildly volume overloaded. We will continue with IV fluids.    Treated with PRN IV Lasix with minimal improvement in serum calcium and actually worsening of serum creatinine. Patient was IV pamidronate and 1 dose of calcitonin without any improvement in serum calcium. Continues to report severe shortness of breath. Discussed with wife regarding prognosis and goals of care. She would choose comfort care as well as residential hospice. Discussed with  palliative care we will start the patient on Dilaudid and Ativan as needed. Also provide 1 dose of Lasix.    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 Palliative care consult for goals of care.  She does not there when    History of depression Holding trazodone.    HTN (hypertension) Blood pressure marginally low.  Currently holding blood pressure medication.    HLD (hyperlipidemia) Holding Lipitor for now.    BPH (benign prostatic hyperplasia) Monitor    Controlled type 2 diabetes mellitus with stage 3 chronic kidney disease, with long-term current use of insulin (HCC) Holding home insulin regimen currently on sliding scale insulin.    Nonischemic cardiomyopathy ([place])   ICD (implantable cardioverter-defibrillator) in place   Chronic combined systolic and diastolic CHF (congestive heart failure) ([place]) Consider the patient can get volume overloaded. We will give low-dose Lasix. Monitor for now.    S/P BKA (below knee amputation), left ([place]) PT OT consulted.  Diet: Cardiac diet, speech consult DVT Prophylaxis: on therapeutic anticoagulation.  Advance goals of care discussion: DNR/DNI discussed with wife on the phone.  Plan is to transition to comfort care if patient does not improve tomorrow.  Family Communication: no family was present at bedside, at the time of interview.  Discussed with wife on the phone Opportunity was given to ask question and all questions were answered satisfactorily.   Disposition:  Discharge to residential hospice.  Consultants: Palliative care Procedures: None  Scheduled Meds: . carvedilol  3.125 mg Oral BID WC  . docusate sodium  100 mg Oral BID  . feeding supplement (ENSURE ENLIVE)  237 mL Oral TID  BM  . furosemide  80 mg Oral Daily  .  HYDROmorphone (DILAUDID) injection  0.5 mg Intravenous Q6H  . LORazepam  0.5 mg  Intravenous BID  . multivitamin with minerals  1 tablet Oral Daily  . pantoprazole  40 mg Oral Daily  . prednisoLONE acetate  1 drop Both Eyes Daily  . sodium chloride flush  10-40 mL Intracatheter Q12H   Continuous Infusions: . sodium chloride 10 mL/hr at 02/09/19 1800  . DAPTOmycin (CUBICIN)  IV 800 mg (02/11/19 2004)   PRN Meds: sodium chloride, acetaminophen **OR** acetaminophen, albuterol, HYDROmorphone (DILAUDID) injection, LORazepam, ondansetron **OR** ondansetron (ZOFRAN) IV Antibiotics: Anti-infectives (From admission, onward)   Start     Dose/Rate Route Frequency Ordered Stop   02/11/19 2000  DAPTOmycin (CUBICIN) 800 mg in sodium chloride 0.9 % IVPB     800 mg 232 mL/hr over 30 Minutes Intravenous Every 48 hours 02/10/19 0245     02/09/19 2000  DAPTOmycin (CUBICIN) 800 mg in sodium chloride 0.9 % IVPB  Status:  Discontinued     800 mg 232 mL/hr over 30 Minutes Intravenous Daily 02/09/19 0936 02/10/19 0245   02/09/19 1800  ceFEPIme (MAXIPIME) 2 g in sodium chloride 0.9 % 100 mL IVPB  Status:  Discontinued     2 g 200 mL/hr over 30 Minutes Intravenous Every 24 hours 02/08/19 1733 02/10/19 0803   02/08/19 1615  ceFEPIme (MAXIPIME) 2 g in sodium chloride 0.9 % 100 mL IVPB     2 g 200 mL/hr over 30 Minutes Intravenous  Once 02/08/19 1606 02/08/19 1722   02/08/19 1615  metroNIDAZOLE (FLAGYL) IVPB 500 mg     500 mg 100 mL/hr over 60 Minutes Intravenous  Once 02/08/19 1606 02/08/19 1809   02/08/19 1615  vancomycin (VANCOCIN) IVPB 1000 mg/200 mL premix  Status:  Discontinued     1,000 mg 200 mL/hr over 60 Minutes Intravenous  Once 02/08/19 1606 02/08/19 1623       Objective: Physical Exam: Vitals:   02/12/19 0436 02/12/19 0438 02/12/19 0905 02/12/19 1633  BP: 127/67  (!) 118/54 101/73  Pulse: (!) 113  (!) 120   Resp: 20     Temp: 98 F (36.7 C)     TempSrc: Oral     SpO2: 97%     Weight:  101.5 kg    Height:        Intake/Output Summary (Last 24 hours) at 02/12/2019  1937 Last data filed at 02/12/2019 1800 Gross per 24 hour  Intake 240 ml  Output 2075 ml  Net -1835 ml   Filed Weights   02/09/19 0500 02/11/19 0500 02/12/19 0438  Weight: 99 kg 100.1 kg 101.5 kg   General: Alert, Awake and Oriented to Time, Place and Person. Appear in moderate distress, affect appropriate Eyes: PERRL, Conjunctiva normal ENT: Oral Mucosa clear moist. Neck: difficult to assess  JVD, no Abnormal Mass Or lumps Cardiovascular: S1 and S2 Present, no Murmur, Peripheral Pulses Present Respiratory: normal respiratory effort, Bilateral Air entry equal and Decreased, no use of accessory muscle, bilateral  Crackles, no wheezes Abdomen: Bowel Sound present, Soft and no tenderness, no hernia Skin: no redness, no Rash, no induration Extremities: left BKA right Pedal edema, no calf tenderness Neurologic: Grossly no focal neuro deficit. Bilaterally Equal motor strength  Data Reviewed: CBC: Recent Labs  Lab 02/08/19 1615 02/09/19 0333 02/10/19 0500 02/11/19 0345  WBC 8.6 7.7 9.2 10.1  NEUTROABS 1.3*  --   --  1.2*  HGB 9.6*  8.6* 9.1* 9.1*  HCT 28.8* 26.7* 28.2* 28.0*  MCV 85.5 84.5 86.0 86.2  PLT 280 232 220 174   Basic Metabolic Panel: Recent Labs  Lab 02/08/19 2302 02/09/19 0333 02/09/19 2055 02/10/19 0946 02/11/19 0345 02/12/19 1310  NA 135 136 135 137 137 134*  K 4.2 3.7 4.0 3.9 3.9 3.9  CL 97* 99 95* 97* 100 94*  CO2 25 24 26 25 26 25   GLUCOSE 101* 80 119* 131* 106* 95  BUN 33* 32* 34* 31* 29* 24*  CREATININE 2.75* 2.57* 2.94* 2.77* 2.57* 2.32*  CALCIUM 13.0* 11.7* 13.1* 12.8* 12.6* 12.8*  MG 1.8  --   --   --  1.8  --     Liver Function Tests: Recent Labs  Lab 02/08/19 1615 02/08/19 2302 02/09/19 0333 02/10/19 0946 02/11/19 0345  AST 32 31 30 43* 46*  ALT 19 18 16 20 27   ALKPHOS 102 95 89 92 94  BILITOT 0.5 0.7 0.4 0.4 0.6  PROT 6.2* 6.0* 5.4* 5.7* 5.8*  ALBUMIN 3.3* 3.1* 2.9* 2.9* 3.0*   Recent Labs  Lab 02/10/19 0946  LIPASE 22   No  results for input(s): AMMONIA in the last 168 hours. Coagulation Profile: No results for input(s): INR, PROTIME in the last 168 hours. Cardiac Enzymes: Recent Labs  Lab 02/08/19 2302  CKTOTAL 268   BNP (last 3 results) Recent Labs    06/15/18 1644  PROBNP 17.0   CBG: Recent Labs  Lab 02/10/19 2204 02/11/19 0801 02/11/19 1153 02/11/19 1639 02/11/19 2111  GLUCAP 130* 103* 117* 126* 147*   Studies: No results found.   Time spent: 35 minutes  Author: Berle Mull, MD [facility] 02/12/2019 7:37 PM  To reach On-call, see care teams to locate the attending and reach out to them via [email or web address]. If 7PM-7AM, please contact night-coverage If you still have difficulty reaching the attending provider, please page the [facility] (Director on Call) for [facility] on amion for assistance.

## 2019-02-12 NOTE — Progress Notes (Signed)
Pt very anxious, asked for sleep medication, and something to help him relax. Ativan PRN given, will continue to monitor pt.

## 2019-02-12 NOTE — Progress Notes (Signed)
Daily Progress Note   Patient Name: Eric Price       Date: 02/12/2019 DOB: 12-Sep-1951  Age: 68 y.o. MRN#: [id number] Attending Physician: Eric Hamman, MD Primary Care Physician: Eric Post, MD Admit Date: 02/08/2019  Reason for Consultation/Follow-up: Establishing goals of care  Subjective/GOC: Patient awake, alert, and answers orientation questions appropriately but still appears confused about some details of his condition. He is mildly short of breath. Denies pain.   He continues to defer and deflect when attempting to discuss [place].  We discussed options moving forward and he reports his hope is to transition home with hospice.  Attempted to call his wife today.  No answer and no option for voicemail.  Length of Stay: 4  Current Medications: Scheduled Meds:   carvedilol  3.125 mg Oral BID WC   docusate sodium  100 mg Oral BID   feeding supplement (ENSURE ENLIVE)  237 mL Oral TID BM   furosemide  80 mg Oral Daily    HYDROmorphone (DILAUDID) injection  0.5 mg Intravenous Q6H   LORazepam  0.5 mg Intravenous TID   multivitamin with minerals  1 tablet Oral Daily   pantoprazole  40 mg Oral Daily   prednisoLONE acetate  1 drop Both Eyes Daily   sodium chloride flush  10-40 mL Intracatheter Q12H    Continuous Infusions:  sodium chloride 10 mL/hr at 02/09/19 1800   DAPTOmycin (CUBICIN)  IV 800 mg (02/11/19 2004)    PRN Meds: sodium chloride, acetaminophen **OR** acetaminophen, albuterol, HYDROmorphone (DILAUDID) injection, LORazepam, ondansetron **OR** ondansetron (ZOFRAN) IV  Physical Exam Vitals signs and nursing note reviewed.  Constitutional:      General: He is awake.     Appearance: He is ill-appearing.  HENT:     Head: Normocephalic and atraumatic.   Pulmonary:     Effort: No tachypnea, accessory muscle usage or respiratory distress.  Neurological:     Mental Status: He is alert and oriented to person, place, and time.     Comments: Forgetful            Vital Signs: BP 102/61 (BP Location: Left Arm)    Pulse (!) 118    Temp 97.6 F (36.4 C) (Oral)    Resp 20    Ht 5\' 8"  (1.727 m)    Wt  101.5 kg    SpO2 97%    BMI 34.02 kg/m  SpO2: SpO2: 97 % O2 Device: O2 Device: Room Air O2 Flow Rate:    Intake/output summary:   Intake/Output Summary (Last 24 hours) at 02/12/2019 2323 Last data filed at 02/12/2019 1800 Gross per 24 hour  Intake 240 ml  Output 2075 ml  Net -1835 ml   LBM: Last BM Date: 02/10/19 Baseline Weight: Weight: 95.3 kg Most recent weight: Weight: 101.5 kg       Palliative Assessment/Data: PPS 30%    Flowsheet Rows     Most Recent Value  Intake Tab  Referral Department  Hospitalist  Unit at Time of Referral  Med/Surg Unit  Palliative Care Primary Diagnosis  Cancer  Palliative Care Type  Return patient Palliative Care  Reason for referral  Clarify Goals of Care  Date first seen by Palliative Care  02/09/19  Clinical Assessment  Palliative Performance Scale Score  30%  Psychosocial & Spiritual Assessment  Palliative Care Outcomes  Patient/Family meeting held?  Yes  Who was at the meeting?  spoke with patient and wife via telephone  Palliative Care Outcomes  Provided end of life care assistance, Clarified goals of care, Provided psychosocial or spiritual support, ACP counseling assistance, Counseled regarding hospice      Patient Active Problem List   Diagnosis Date Noted   Palliative care by specialist    Goals of care, counseling/discussion    Hypercalcemia    Sepsis ([place]) 01/23/2019   Suspected Covid-19 Virus Infection 01/23/2019   HCAP (healthcare-associated pneumonia) 01/23/2019   CAP (community acquired pneumonia) 11/23/2018   Dyslipidemia 08/31/2018   Leukocytosis 08/24/2018    Tachycardia 08/24/2018   Tremor of both hands 08/24/2018   Chronic anemia 08/24/2018   Diabetic neuropathy ([place]) 08/24/2018   GERD (gastroesophageal reflux disease) 08/24/2018   Physical deconditioning 08/24/2018   AKI (acute kidney injury) ([place]) 08/23/2018   Abdominal distension 06/09/2018   Electrolyte imbalance 06/09/2018   Scrotal pain 06/09/2018   Scrotal swelling 06/09/2018   Symptom of wheezing 06/09/2018   Urinary hesitancy 06/09/2018   Muscle tension dysphonia 03/22/2018   CKD (chronic kidney disease) stage 3, GFR 30-59 ml/min (HCC) 03/08/2018   SIRS (systemic inflammatory response syndrome) ([place]) 03/08/2018   Acute encephalopathy 03/08/2018   Acute kidney injury ([place])    Price-nasal drainage 01/19/2018   Seasonal allergic rhinitis due to pollen 01/19/2018   Bacteremia due to Enterococcus 07/08/2017   Mucositis due to chemotherapy 07/08/2017   Febrile neutropenia ([place]) 07/04/2017   Drug-induced constipation 06/24/2017   Thrush, oral 06/24/2017   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   De Quervain's tenosynovitis, left 12/29/2016   Chronic combined systolic and diastolic CHF (congestive heart failure) ([place]) 07/17/2016   Nonsustained ventricular tachycardia ([place]) 07/17/2016   PAD (peripheral artery disease) ([place]) 07/17/2016   Eczema 06/04/2016   SI (sacroiliac) joint dysfunction 01/21/2016   Hip pain, left 12/23/2015   History of hypercoagulable state 12/23/2015   Degenerative arthritis of right knee 10/28/2015   Other specified personal risk factors, not elsewhere classified 08/27/2015   ICD (implantable cardioverter-defibrillator) in place 08/27/2015   Phantom limb pain ([place]) 05/24/2015   Memory loss    Status Price below knee amputation of left lower extremity ([place]) 03/08/2015   S/P BKA (below knee amputation), left ([place]) 03/08/2015   Acquired absence of left leg below knee ([place]) 03/08/2015   Ischemic toe  03/05/2015   Pain in joint, ankle and foot 02/28/2015   Cold  sensation of skin-Left foot 02/28/2015   Other disturbances of skin sensation 02/28/2015   Nonischemic cardiomyopathy ([place]) 02/22/2015   Coronary artery disease involving native coronary artery of native heart without angina pectoris 67/34/1937   Embolic disease of toe ([place]) 02/22/2015   Ischemic ulcer of toe of left foot ([place]) 02/22/2015   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 ([place]) 02/22/2015   Diabetic foot ulcer ([place]) 02/21/2015   Critical lower limb ischemia 02/19/2015   Controlled type 2 diabetes mellitus with stage 3 chronic kidney disease, with long-term current use of insulin ([place]) 09/19/2014   Hereditary and idiopathic peripheral neuropathy 09/07/2014   [place] arthropathy 08/21/2014   Primary localized osteoarthrosis, lower leg 06/19/2014   Low back pain 05/28/2014   Acromioclavicular joint arthritis 05/28/2014   Dental decay 05/25/2014   Major depressive disorder, recurrent episode ([place]) 12/11/2013   Long-term use of immunosuppressant medication 12/04/2013   Obesity (BMI 30-39.9) 10/31/2013   Chest pain 10/20/2013   Stable angina ([place]) 10/19/2013   Type 2 diabetes mellitus with hyperglycemia ([place]) 10/19/2013   BPH (benign prostatic hyperplasia) 10/11/2013   Enlarged prostate without lower urinary tract symptoms (luts) 10/11/2013   Dyspnea 06/30/2013   Depression 01/19/2013   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 male  with past medical history of CAD s/p PCI, nonischemic cardiomyopathy, ICD placement, HTN, HLD, BPH, T2DM, gout, arterial thrombus s/p left BKA, HFpEF EF  40-45%, mycosis fungoides, and chronic anticoagulation admitted on 02/08/2019 with fatigue, poor oral intake. 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 Recently deemed a poor candidate for further chemotherapy and was discharge home with hospice services April 2020. Recent [facility] admission for bacteremia discharged with PICC/IV antibiotics x6 weeks. In ED, patient with severe hypercalcemia, AKI on CKD, sepsis, and mild fluid overload. Palliative medicine consultation for goals of care.   Assessment: Mycosis fungoides Severe hypercalcemia Acute kidney injury on CKD Acute metabolic encephalopathy Nonischemic cardiomyopathy s/p ICD Combined CHF S/p BKA Weakness/deconditioning  Recommendations/Plan:  DNR. If no improvement through the weekend, wife noted to be considering hospice facility placement.  Based on his condition and decline I believe this is best options as his wife has been struggling to care for him and he has now declined even more since that time.   Continue current plan of care and interventions.   PMT provider attempted to f/u with wife this afternoon. No answer and unable to leave vm. PMT provider will f/u with patient/wife Monday.    Code Status: DNR   Code Status Orders  (From admission, onward)         Start     Ordered   02/10/19 1223  Do not attempt resuscitation (DNR)  Continuous    Question Answer Comment  In the event of cardiac or respiratory ARREST Do not call a code blue   In the event of cardiac or respiratory ARREST Do not perform Intubation, CPR, defibrillation or ACLS   In the event of cardiac or respiratory ARREST Use medication by any route, position, wound care, and other measures to relive pain and suffering. May use oxygen, suction and manual treatment of airway obstruction as needed for comfort.      02/10/19  1223        Code Status History    Date Active Date Inactive Code Status Order ID Comments User Context   02/08/2019 2329  02/10/2019 1223 Full Code [id number]  Schorr, Rhetta Mura, NP Inpatient   02/08/2019 1925 02/08/2019 2329 DNR [id number]  Eric Hamman, MD Inpatient   02/08/2019 1847 02/08/2019 1925 DNR [id number]  Eric Hamman, MD Inpatient   01/24/2019 0013 01/27/2019 2021 DNR [id number]  Toy Baker, MD ED   11/23/2018 1749 11/27/2018 1832 Full Code [id number]  Kayleen Memos, DO ED   08/23/2018 2109 08/26/2018 1501 Full Code [id number]  Shela Leff, MD Inpatient   03/08/2018 0339 03/14/2018 2040 Full Code [id number]  Vianne Bulls, MD ED   08/27/2015 2008 08/28/2015 1653 Full Code [id number]  [name], MD Inpatient   03/08/2015 1745 03/16/2015 1214 Full Code [id number]  Cathlyn Parsons, PA-C Inpatient   03/05/2015 1258 03/08/2015 1745 Full Code [id number]  Gabriel Earing, PA-C Inpatient   02/21/2015 1152 02/22/2015 1612 Full Code [id number]  [name], MD Inpatient   02/21/2015 0348 02/21/2015 1152 Full Code [id number]  Lily Kocher, MD Inpatient   10/19/2013 2318 10/20/2013 2034 Full Code [id number]  Orson Eva, MD Inpatient       Prognosis:  Poor prognosis  Discharge Planning:  To Be Determined: home with hospice versus hospice facility placement  Care plan was discussed with patient, [name]. Attempted to contact wife. No answer and VM box full.   Thank you for allowing the Palliative Medicine Team to assist in the care of this patient.   Total Time 30 Prolonged Time Billed  no      Greater than 50%  of this time was spent counseling and coordinating care related to the above assessment and plan.  Micheline Rough, MD [place] Team [phone]   Please contact Palliative Medicine Team phone at [phone] for questions and concerns.

## 2019-02-13 DIAGNOSIS — I428 Other cardiomyopathies: Secondary | ICD-10-CM

## 2019-02-13 LAB — CULTURE, BLOOD (ROUTINE X 2)
Culture: NO GROWTH
Culture: NO GROWTH
Special Requests: ADEQUATE
Special Requests: ADEQUATE

## 2019-02-13 MED ORDER — HYDROMORPHONE HCL 1 MG/ML IJ SOLN
0.5000 mg | INTRAMUSCULAR | Status: DC | PRN
  Filled 2019-02-13: qty 0.5

## 2019-02-13 NOTE — Progress Notes (Signed)
Palliative Medicine RN Note: Rec'd call from PMT NP Ihor Dow requesting help with deactivating AICD, as pt is now comfort care only. Patient has a [employer] AICD; paged local [employer] rep Dannial Monarch. She will be at [facility] later today and will come deactivate the device. Notified pt's RN via [place].  Marjie Skiff Jaleiah Asay, RN, BSN, [facility] Palliative Medicine Team 02/13/2019 12:52 PM Office [phone]

## 2019-02-13 NOTE — Progress Notes (Signed)
   02/13/19 1300  OT Visit Information  Last OT Received On 02/13/19  Reason Eval/Treat Not Completed  (Pt now on comfort care.)  Nestor Lewandowsky, OTR/L [place] Pager: [phone] Office: [phone]

## 2019-02-13 NOTE — Progress Notes (Signed)
This RN received a call from [employer] requesting for information on the patient because the patient has a son who lives overseas and wants to come home to see the patient. Percell Locus the Education officer, museum notified and given the [employer] number.

## 2019-02-13 NOTE — Progress Notes (Signed)
Physical Therapy Discharge Patient Details Name: Judah Carchi MRN: [id number] DOB: Jun 16, 1951 Today's Date: 02/13/2019 Time:  -     Patient discharged from PT services secondary to medical decline - will need to re-order PT to resume therapy services.  Please see latest therapy progress note for current level of functioning and progress toward goals.    Progress and discharge plan discussed with patient and/or caregiver: Did not dicuss, pt now comfort care only and PT is not needed.  Will inform supervising PT to address POC and remove order.   GP     Larine Fielding Eli Hose 02/13/2019, 1:12 PM

## 2019-02-13 NOTE — Care Management Important Message (Signed)
Important Message  Patient Details  Name: Eric Price MRN: [id number] Date of Birth: April 05, 1951   Medicare Important Message Given:  Yes    Orbie Pyo 02/13/2019, 4:27 PM

## 2019-02-13 NOTE — Plan of Care (Signed)

## 2019-02-13 NOTE — Progress Notes (Signed)
[facility] Progress Note  Patient: Eric Price TWS:[id number]   PCP: [name], MD DOB: 13-Jul-1951   DOA: 02/08/2019   DOS: 02/13/2019   Date of Service: the patient was seen and examined on 02/13/2019  Brief hospital course: Pt. with PMH of CAD s/p PCI, nonischemic cardiomyopathy, ICD placement, HTN, HLD, BPH, T2DM, gout, arterial thrombus s/p left BKA, HFpEF EF 40-45%, mycosis fungoides, and chronic anticoagulation ; admitted on 02/08/2019, presented with complaint of fatigue, was found to have hypercalcemia acute kidney injury. Currently further plan is continue currentTreatment.  Subjective: No nausea no vomiting no fever no chills.  Still shortness of breath still pain in the belly.  Assessment and Plan:  Sepsis ([place]) ruled out History of enterococcus bacteremia So far no growth in blood cultures and urine cultures. Receiving IV fluid. Patient was already on IV daptomycin which she has been continuing without any missed.   We will currently stop cefepime now that the cultures are negative. Now comfort care  Severe hypercalcemia. Acute kidney injury on chronic kidney disease stage III. Acute metabolic encephalopathy Unresponsive episode at home. Comes in with confusion. Unremarkable CT head Renal function from baseline 1.6 to currently worsening Serum calcium also increased.  Corrected serum calcium is 14. While the patient has sepsis also appears mildly volume overloaded. We will continue with IV fluids.    Treated with PRN IV Lasix with minimal improvement in serum calcium and actually worsening of serum creatinine. Patient was IV pamidronate and 1 dose of calcitonin without any improvement in serum calcium. Continues to report severe shortness of breath. Discussed with wife regarding prognosis and goals of care. She would choose comfort care as well as residential hospice. Discussed with palliative care we will start the patient on Dilaudid and Ativan as needed.  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 Palliative care consult for goals of care.   Now comfort care    History of depression Holding trazodone.    HTN (hypertension) Blood pressure marginally low.  Currently holding blood pressure medication.    HLD (hyperlipidemia) Holding Lipitor for now.    BPH (benign prostatic hyperplasia) Monitor    Controlled type 2 diabetes mellitus with stage 3 chronic kidney disease, with long-term current use of insulin (HCC) Holding home insulin regimen  Now comfort care    Nonischemic cardiomyopathy ([place])   ICD (implantable cardioverter-defibrillator) in place   Chronic combined systolic and diastolic CHF (congestive heart failure) (HCC) Volume overloaded. We will give low-dose Lasix. Now comfort care Device technician consulted for turning off ICD.    S/P BKA (below knee amputation), left (HCC)   Diet: comfort feeds DVT Prophylaxis: comfort care  Advance goals of care discussion: DNR/DNI discussed with wife on the phone. transition to comfort care  Family Communication: no family was present at bedside, at the time of interview.  Discussed with wife on the phone Opportunity was given to ask question and all questions were answered satisfactorily.   Disposition:  Discharge to residential hospice.  Consultants: Palliative care Procedures: None  Scheduled Meds: . carvedilol  3.125 mg Oral BID WC  . docusate sodium  100 mg Oral BID  . furosemide  80 mg Oral Daily  .  HYDROmorphone (DILAUDID) injection  0.5 mg Intravenous Q6H  . LORazepam  0.5 mg Intravenous TID  . prednisoLONE acetate  1 drop Both  Eyes Daily  . sodium chloride flush  10-40 mL Intracatheter Q12H   Continuous Infusions: . sodium chloride 10 mL/hr at 02/09/19 1800   PRN Meds: sodium chloride,  acetaminophen **OR** acetaminophen, albuterol, HYDROmorphone (DILAUDID) injection, LORazepam, ondansetron **OR** ondansetron (ZOFRAN) IV Antibiotics: Anti-infectives (From admission, onward)   Start     Dose/Rate Route Frequency Ordered Stop   02/11/19 2000  DAPTOmycin (CUBICIN) 800 mg in sodium chloride 0.9 % IVPB  Status:  Discontinued     800 mg 232 mL/hr over 30 Minutes Intravenous Every 48 hours 02/10/19 0245 02/13/19 0755   02/09/19 2000  DAPTOmycin (CUBICIN) 800 mg in sodium chloride 0.9 % IVPB  Status:  Discontinued     800 mg 232 mL/hr over 30 Minutes Intravenous Daily 02/09/19 0936 02/10/19 0245   02/09/19 1800  ceFEPIme (MAXIPIME) 2 g in sodium chloride 0.9 % 100 mL IVPB  Status:  Discontinued     2 g 200 mL/hr over 30 Minutes Intravenous Every 24 hours 02/08/19 1733 02/10/19 0803   02/08/19 1615  ceFEPIme (MAXIPIME) 2 g in sodium chloride 0.9 % 100 mL IVPB     2 g 200 mL/hr over 30 Minutes Intravenous  Once 02/08/19 1606 02/08/19 1722   02/08/19 1615  metroNIDAZOLE (FLAGYL) IVPB 500 mg     500 mg 100 mL/hr over 60 Minutes Intravenous  Once 02/08/19 1606 02/08/19 1809   02/08/19 1615  vancomycin (VANCOCIN) IVPB 1000 mg/200 mL premix  Status:  Discontinued     1,000 mg 200 mL/hr over 60 Minutes Intravenous  Once 02/08/19 1606 02/08/19 1623       Objective: Physical Exam: Vitals:   02/12/19 2123 02/13/19 0448 02/13/19 0452 02/13/19 0930  BP: 102/61 111/60  123/70  Pulse: (!) 118 (!) 120    Resp:    (!) 22  Temp: 97.6 F (36.4 C) 98 F (36.7 C)  98.4 F (36.9 C)  TempSrc: Axillary Axillary  Oral  SpO2: 97% 97%  93%  Weight:   101.9 kg   Height:        Intake/Output Summary (Last 24 hours) at 02/13/2019 1844 Last data filed at 02/13/2019 1546 Gross per 24 hour  Intake 120 ml  Output 400 ml  Net -280 ml   Filed Weights   02/11/19 0500 02/12/19 0438 02/13/19 0452  Weight: 100.1 kg 101.5 kg 101.9 kg   General: Alert, Awake and Oriented to Time, Place and Person.  Appear in moderate distress, affect appropriate Eyes: PERRL, Conjunctiva normal ENT: Oral Mucosa clear moist. Neck: difficult to assess  JVD, no Abnormal Mass Or lumps Cardiovascular: S1 and S2 Present, no Murmur, Peripheral Pulses Present Respiratory: normal respiratory effort, Bilateral Air entry equal and Decreased, no use of accessory muscle, bilateral  Crackles, no wheezes Abdomen: Bowel Sound present, Soft and no tenderness, no hernia Skin: no redness, no Rash, no induration Extremities: left BKA right Pedal edema, no calf tenderness Neurologic: Grossly no focal neuro deficit. Bilaterally Equal motor strength  Data Reviewed: CBC: Recent Labs  Lab 02/08/19 1615 02/09/19 0333 02/10/19 0500 02/11/19 0345  WBC 8.6 7.7 9.2 10.1  NEUTROABS 1.3*  --   --  1.2*  HGB 9.6* 8.6* 9.1* 9.1*  HCT 28.8* 26.7* 28.2* 28.0*  MCV 85.5 84.5 86.0 86.2  PLT 280 232 220 101   Basic Metabolic Panel: Recent Labs  Lab 02/08/19 2302 02/09/19 0333 02/09/19 2055 02/10/19 0946 02/11/19 0345 02/12/19 1310  NA 135 136 135 137 137 134*  K 4.2 3.7  4.0 3.9 3.9 3.9  CL 97* 99 95* 97* 100 94*  CO2 25 24 26 25 26 25   GLUCOSE 101* 80 119* 131* 106* 95  BUN 33* 32* 34* 31* 29* 24*  CREATININE 2.75* 2.57* 2.94* 2.77* 2.57* 2.32*  CALCIUM 13.0* 11.7* 13.1* 12.8* 12.6* 12.8*  MG 1.8  --   --   --  1.8  --     Liver Function Tests: Recent Labs  Lab 02/08/19 1615 02/08/19 2302 02/09/19 0333 02/10/19 0946 02/11/19 0345  AST 32 31 30 43* 46*  ALT 19 18 16 20 27   ALKPHOS 102 95 89 92 94  BILITOT 0.5 0.7 0.4 0.4 0.6  PROT 6.2* 6.0* 5.4* 5.7* 5.8*  ALBUMIN 3.3* 3.1* 2.9* 2.9* 3.0*   Recent Labs  Lab 02/10/19 0946  LIPASE 22   No results for input(s): AMMONIA in the last 168 hours. Coagulation Profile: No results for input(s): INR, PROTIME in the last 168 hours. Cardiac Enzymes: Recent Labs  Lab 02/08/19 2302  CKTOTAL 268   BNP (last 3 results) Recent Labs    06/15/18 1644  PROBNP  17.0   CBG: Recent Labs  Lab 02/10/19 2204 02/11/19 0801 02/11/19 1153 02/11/19 1639 02/11/19 2111  GLUCAP 130* 103* 117* 126* 147*   Studies: No results found.   Time spent: 35 minutes  Author: Berle Mull, MD [facility] 02/13/2019 6:44 PM  To reach On-call, see care teams to locate the attending and reach out to them via [email or web address]. If 7PM-7AM, please contact night-coverage If you still have difficulty reaching the attending provider, please page the [facility] (Director on Call) for [facility] on amion for assistance.

## 2019-02-13 NOTE — TOC Progression Note (Signed)
Transition of Care [facility]) - Progression Note    Patient Details  Name: Eric Price MRN: [id number] Date of Birth: 07/15/1951  Transition of Care [facility]) CM/SW [place], LCSW Phone Number: 02/13/2019, 5:19 PM  Clinical Narrative:    CSW contacted [employer] [phone], [phone]) regarding allowing patient's son who is in the [place] to come visit from [place], as requested by family. CSW answered questions regarding patient's prognosis.    Expected Discharge Plan: [place] Barriers to Discharge: No Barriers Identified  Expected Discharge Plan and Services Expected Discharge Plan: [place] In-house Referral: Clinical Social Work, Hospice / Palliative Care Discharge Planning Services: NA Post Acute Care Choice: Hospice Living arrangements for the past 2 months: Single Family Home                 DME Arranged: N/A DME Agency: NA       HH Arranged: NA HH Agency: NA         Social Determinants of Health (SDOH) Interventions    Readmission Risk Interventions Readmission Risk Prevention Plan 02/13/2019  Transportation Screening Complete  Medication Review Press photographer) Complete  PCP or Specialist appointment within 3-5 days of discharge Complete  HRI or [place] Complete  SW Recovery Care/Counseling Consult Complete  [place] Patient Refused  Some recent data might be hidden

## 2019-02-13 NOTE — Progress Notes (Addendum)
Daily Progress Note   Patient Name: Eric Price       Date: 02/13/2019 DOB: August 12, 1951  Age: 68 y.o. MRN#: [id number] Attending Physician: Lavina Hamman, MD Primary Care Physician: [name], MD Admit Date: 02/08/2019  Reason for Consultation/Follow-up: Establishing goals of care  Subjective/GOC: Patient lethargic. Opens eyes to voice but not following commands. Tachypnea and increased work of breathing. Instructed RN to give prn dilaudid. Also has scheduled dilaudid on MAR  F/u with wife, Eric Price, via telephone. She is tearful but understands that [name] may only live a few more days. She understands diagnoses and poor prognosis. She is waiting to hear from social work about hospice facility placement.   Discussed shift to comfort in order to allow comfort, peace, dignity at EOL. Educated on EOL expectations.   Wife tearful. Emotional/spiritual support provided. Answered questions and concerns.   Length of Stay: 5  Current Medications: Scheduled Meds:  . carvedilol  3.125 mg Oral BID WC  . docusate sodium  100 mg Oral BID  . furosemide  80 mg Oral Daily  .  HYDROmorphone (DILAUDID) injection  0.5 mg Intravenous Q6H  . LORazepam  0.5 mg Intravenous TID  . prednisoLONE acetate  1 drop Both Eyes Daily  . sodium chloride flush  10-40 mL Intracatheter Q12H    Continuous Infusions: . sodium chloride 10 mL/hr at 02/09/19 1800    PRN Meds: sodium chloride, acetaminophen **OR** acetaminophen, albuterol, HYDROmorphone (DILAUDID) injection, LORazepam, ondansetron **OR** ondansetron (ZOFRAN) IV  Physical Exam Vitals signs and nursing note reviewed.  Constitutional:      Appearance: He is ill-appearing.  HENT:     Head: Normocephalic and atraumatic.  Pulmonary:     Effort:  Tachypnea present. No accessory muscle usage or respiratory distress.     Comments: RN instructed to give prn dilaudid dose for tachypnea. Skin:    General: Skin is warm and dry.  Neurological:     Mental Status: He is lethargic.            Vital Signs: BP 123/70 (BP Location: Left Arm)   Pulse (!) 120   Temp 98.4 F (36.9 C) (Oral)   Resp (!) 22   Ht 5\' 8"  (1.727 m)   Wt 101.9 kg   SpO2 93%   BMI 34.16 kg/m  SpO2:  SpO2: 93 % O2 Device: O2 Device: Room Air O2 Flow Rate:    Intake/output summary:   Intake/Output Summary (Last 24 hours) at 02/13/2019 1226 Last data filed at 02/13/2019 0949 Gross per 24 hour  Intake 120 ml  Output 1125 ml  Net -1005 ml   LBM: Last BM Date: 02/10/19 Baseline Weight: Weight: 95.3 kg Most recent weight: Weight: 101.9 kg       Palliative Assessment/Data: PPS 20%    Flowsheet Rows     Most Recent Value  Intake Tab  Referral Department  Hospitalist  Unit at Time of Referral  Med/Surg Unit  Palliative Care Primary Diagnosis  Cancer  Palliative Care Type  Return patient Palliative Care  Reason for referral  Clarify Goals of Care  Date first seen by Palliative Care  02/09/19  Clinical Assessment  Palliative Performance Scale Score  20%  Psychosocial & Spiritual Assessment  Palliative Care Outcomes  Patient/Family meeting held?  Yes  Who was at the meeting?  spoke with patient and wife via telephone  Palliative Care Outcomes  Clarified goals of care, Provided end of life care assistance, Provided psychosocial or spiritual support, Changed to focus on comfort      Patient Active Problem List   Diagnosis Date Noted  . Palliative care by specialist   . Goals of care, counseling/discussion   . Hypercalcemia   . Sepsis ([place]) 01/23/2019  . Suspected Covid-19 Virus Infection 01/23/2019  . HCAP (healthcare-associated pneumonia) 01/23/2019  . CAP (community acquired pneumonia) 11/23/2018  . Dyslipidemia 08/31/2018  . Leukocytosis  08/24/2018  . Tachycardia 08/24/2018  . Tremor of both hands 08/24/2018  . Chronic anemia 08/24/2018  . Diabetic neuropathy ([place]) 08/24/2018  . GERD (gastroesophageal reflux disease) 08/24/2018  . Physical deconditioning 08/24/2018  . AKI (acute kidney injury) ([place]) 08/23/2018  . Abdominal distension 06/09/2018  . Electrolyte imbalance 06/09/2018  . Scrotal pain 06/09/2018  . Scrotal swelling 06/09/2018  . Symptom of wheezing 06/09/2018  . Urinary hesitancy 06/09/2018  . Muscle tension dysphonia 03/22/2018  . CKD (chronic kidney disease) stage 3, GFR 30-59 ml/min (HCC) 03/08/2018  . SIRS (systemic inflammatory response syndrome) ([place]) 03/08/2018  . Acute encephalopathy 03/08/2018  . Acute kidney injury ([place])   . Post-nasal drainage 01/19/2018  . Seasonal allergic rhinitis due to pollen 01/19/2018  . Bacteremia due to Enterococcus 07/08/2017  . Mucositis due to chemotherapy 07/08/2017  . Febrile neutropenia ([place]) 07/04/2017  . Drug-induced constipation 06/24/2017  . Thrush, oral 06/24/2017  . 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 De Quervain's tenosynovitis, left 12/29/2016  . Chronic combined systolic and diastolic CHF (congestive heart failure) ([place]) 07/17/2016  . Nonsustained ventricular tachycardia ([place]) 07/17/2016  . PAD (peripheral artery disease) ([place]) 07/17/2016  . Eczema 06/04/2016  . SI (sacroiliac) joint dysfunction 01/21/2016  . Hip pain, left 12/23/2015  . History of hypercoagulable state 12/23/2015  . Degenerative arthritis of right knee 10/28/2015  . Other specified personal risk factors, not elsewhere classified 08/27/2015  . ICD (implantable cardioverter-defibrillator) in place 08/27/2015  . Phantom limb pain ([place]) 05/24/2015  . Memory loss   . Status post below knee amputation of left lower extremity ([place]) 03/08/2015  . S/P BKA (below knee amputation), left ([place]) 03/08/2015  . Acquired absence of left leg below knee ([place]) 03/08/2015  .  Ischemic toe 03/05/2015  . Pain in joint, ankle and foot 02/28/2015  . Cold sensation of skin-Left foot 02/28/2015  . Other disturbances of skin sensation 02/28/2015  . Nonischemic  cardiomyopathy ([place]) 02/22/2015  . Coronary artery disease involving native coronary artery of native heart without angina pectoris 02/22/2015  . Embolic disease of toe ([place]) 02/22/2015  . Ischemic ulcer of toe of left foot ([place]) 02/22/2015  . 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 Diabetic foot ulcer ([place]) 02/21/2015  . Critical lower limb ischemia 02/19/2015  . Controlled type 2 diabetes mellitus with stage 3 chronic kidney disease, with long-term current use of insulin ([place]) 09/19/2014  . Hereditary and idiopathic peripheral neuropathy 09/07/2014  . 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 Primary localized osteoarthrosis, lower leg 06/19/2014  . Low back pain 05/28/2014  . Acromioclavicular joint arthritis 05/28/2014  . Dental decay 05/25/2014  . Major depressive disorder, recurrent episode ([place]) 12/11/2013  . Long-term use of immunosuppressant medication 12/04/2013  . Obesity (BMI 30-39.9) 10/31/2013  . Chest pain 10/20/2013  . Stable angina ([place]) 10/19/2013  . Type 2 diabetes mellitus with hyperglycemia ([place]) 10/19/2013  . BPH (benign prostatic hyperplasia) 10/11/2013  . Enlarged prostate without lower urinary tract symptoms (luts) 10/11/2013  . Dyspnea 06/30/2013  . Depression 01/19/2013  . 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 History of depression 07/13/2012  . HTN (hypertension) 07/13/2012  . HLD (hyperlipidemia) 07/13/2012  . Personal history of other mental and behavioral disorders 07/13/2012  . Type 2 diabetes mellitus with diabetic polyneuropathy ([place]) 07/13/2012    Palliative Care Assessment & Plan   Patient Profile: 68 y.o. male  with past medical history of CAD s/p PCI, nonischemic cardiomyopathy, ICD placement, HTN, HLD, BPH, T2DM, gout, arterial thrombus s/p left  BKA, HFpEF EF 40-45%, mycosis fungoides, and chronic anticoagulation admitted on 02/08/2019 with fatigue, poor oral intake. 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 Recently deemed a poor candidate for further chemotherapy and was discharge home with hospice services April 2020. Recent [facility] admission for bacteremia discharged with PICC/IV antibiotics x6 weeks. In ED, patient with severe hypercalcemia, AKI on CKD, sepsis, and mild fluid overload. Palliative medicine consultation for goals of care.   Assessment: Mycosis fungoides Severe hypercalcemia Acute kidney injury on CKD Acute metabolic encephalopathy Nonischemic cardiomyopathy s/p ICD Combined CHF S/p BKA Weakness/deconditioning  Recommendations/Plan:  Shift to comfort measures only. Discontinued interventions not aimed at comfort.   PRN comfort meds on MAR. Dilaudid scheduled q6h.   Comfort feeds per patient/family request.   Lift visitor restriction to allow wife to visit at bedside.   Deactivate ICD.  SW following for residential hospice placement.    Code Status: DNR   Code Status Orders  (From admission, onward)         Start     Ordered   02/10/19 1223  Do not attempt resuscitation (DNR)  Continuous    Question Answer Comment  In the event of cardiac or respiratory ARREST Do not call a "code blue"   In the event of cardiac or respiratory ARREST Do not perform Intubation, CPR, defibrillation or ACLS   In the event of cardiac or respiratory ARREST Use medication by any route, position, wound care, and other measures to relive pain and suffering. May use oxygen, suction and manual treatment of airway obstruction as needed for comfort.      02/10/19 1223        Code Status History    Date Active Date Inactive Code Status Order ID Comments User Context   02/08/2019 2329 02/10/2019 1223 Full Code [id number]  Jeryl Columbia, NP Inpatient   02/08/2019 1925 02/08/2019 2329 DNR  [id number]  Lavina Hamman,  MD Inpatient   02/08/2019 1847 02/08/2019 1925 DNR [id number]  Lavina Hamman, MD Inpatient   01/24/2019 0013 01/27/2019 2021 DNR [id number]  Toy Baker, MD ED   11/23/2018 1749 11/27/2018 1832 Full Code [id number]  Kayleen Memos, DO ED   08/23/2018 2109 08/26/2018 1501 Full Code [id number]  Shela Leff, MD Inpatient   03/08/2018 0339 03/14/2018 2040 Full Code [id number]  Vianne Bulls, MD ED   08/27/2015 2008 08/28/2015 1653 Full Code [id number]  [name], MD Inpatient   03/08/2015 1745 03/16/2015 1214 Full Code [id number]  Cathlyn Parsons, PA-C Inpatient   03/05/2015 1258 03/08/2015 1745 Full Code [id number]  Gabriel Earing, PA-C Inpatient   02/21/2015 1152 02/22/2015 1612 Full Code [id number]  [name], MD Inpatient   02/21/2015 0348 02/21/2015 1152 Full Code [id number]  Lily Kocher, MD Inpatient   10/19/2013 2318 10/20/2013 2034 Full Code [id number]  Orson Eva, MD Inpatient       Prognosis:  Poor prognosis likely days   Discharge Planning:  Hospice facility  Care plan was discussed with Dr Posey Pronto, RN, wife  Thank you for allowing the Palliative Medicine Team to assist in the care of this patient.   Time In: 1010 Time Out: 1035 Total Time 25 Prolonged Time Billed no      Greater than 50%  of this time was spent counseling and coordinating care related to the above assessment and plan.  Ihor Dow, FNP-C Palliative Medicine Team  Phone: [phone] Fax: [phone]  Please contact Palliative Medicine Team phone at [phone] for questions and concerns.

## 2019-02-13 NOTE — Progress Notes (Signed)
[facility] room available for Mr. Duffus tomorrow. CSW [place] aware. Left message with spouse [name] requesting call back. Will update [name] when she returns call.   Thank you,  Erling Conte, LCSW [phone]

## 2019-02-13 NOTE — TOC Initial Note (Signed)
Transition of Care [facility]) - Initial/Assessment Note    Patient Details  Name: Eric Price MRN: [id number] Date of Birth: [birth year]  Transition of Care [facility] CM/SW Contact:    Benard Halsted, LCSW Phone Number: 02/13/2019, 12:09 PM  Clinical Narrative:                 CSW received request for residential hospice placement. CSW spoke with patient's wife. She requests [employer] since they are closest. She states that their family is doing the best that that they can during this time and CSW provided supportive listening. CSW sent referral to their liaison for review.  Expected Discharge Plan: [place] Barriers to Discharge: No Barriers Identified   Patient Goals and CMS Choice Patient states their goals for this hospitalization and ongoing recovery are:: Comfort CMS Medicare.gov Compare Post Acute Care list provided to:: Other (Comment Required)(Spouse) Choice offered to / list presented to : Spouse  Expected Discharge Plan and Services Expected Discharge Plan: [place] In-house Referral: Clinical Social Work, Hospice / Palliative Care Discharge Planning Services: NA Post Acute Care Choice: Hospice Living arrangements for the past 2 months: Single Family Home                 DME Arranged: N/A DME Agency: NA       HH Arranged: NA HH Agency: NA        Prior Living Arrangements/Services Living arrangements for the past 2 months: [place] with:: Spouse Patient language and need for interpreter reviewed:: Yes Do you feel safe going back to the place where you live?: No   Needs more help  Need for Family Participation in Patient Care: Yes (Comment) Care giver support system in place?: Yes (comment)   Criminal Activity/Legal Involvement Pertinent to Current Situation/Hospitalization: No - Comment as needed  Activities of Daily Living Home Assistive Devices/Equipment: Bedside commode/3-in-1, CBG Meter, Cane (specify quad or  straight), Eyeglasses, Hospital bed, Nebulizer, Prosthesis, Shower chair without back, Environmental consultant (specify type) ADL Screening (condition at time of admission) Patient's cognitive ability adequate to safely complete daily activities?: Yes Is the patient deaf or have difficulty hearing?: No Does the patient have difficulty seeing, even when wearing glasses/contacts?: No Does the patient have difficulty concentrating, remembering, or making decisions?: No Patient able to express need for assistance with ADLs?: Yes Does the patient have difficulty dressing or bathing?: Yes Independently performs ADLs?: No Communication: Independent Dressing (OT): Dependent Grooming: Dependent Feeding: Dependent Bathing: Dependent Toileting: Dependent In/Out Bed: Dependent Walks in Home: Dependent, Needs assistance Does the patient have difficulty walking or climbing stairs?: Yes Weakness of Legs: Right Weakness of Arms/Hands: Both  Permission Sought/Granted Permission sought to share information with : Facility Sport and exercise psychologist, Family Supports Permission granted to share information with : Yes, Verbal Permission Granted  Share Information with NAME: [name]  Permission granted to share info w AGENCY: [place] granted to share info w Relationship: Spouse  Permission granted to share info w Contact Information: [phone]  Emotional Assessment Appearance:: Appears stated age Attitude/Demeanor/Rapport: Unable to Assess Affect (typically observed): Unable to Assess Orientation: : Oriented to Place, Oriented to Self Alcohol / Substance Use: Not Applicable Psych Involvement: No (comment)  Admission diagnosis:  Feeling lethargic, CHF Patient Active Problem List   Diagnosis Date Noted  . Palliative care by specialist   . Goals of care, counseling/discussion   . Hypercalcemia   . Sepsis ([place]) 01/23/2019  . Suspected Covid-19 Virus Infection 01/23/2019  .  HCAP  (healthcare-associated pneumonia) 01/23/2019  . CAP (community acquired pneumonia) 11/23/2018  . Dyslipidemia 08/31/2018  . Leukocytosis 08/24/2018  . Tachycardia 08/24/2018  . Tremor of both hands 08/24/2018  . Chronic anemia 08/24/2018  . Diabetic neuropathy ([place]) 08/24/2018  . GERD (gastroesophageal reflux disease) 08/24/2018  . Physical deconditioning 08/24/2018  . AKI (acute kidney injury) ([place]) 08/23/2018  . Abdominal distension 06/09/2018  . Electrolyte imbalance 06/09/2018  . Scrotal pain 06/09/2018  . Scrotal swelling 06/09/2018  . Symptom of wheezing 06/09/2018  . Urinary hesitancy 06/09/2018  . Muscle tension dysphonia 03/22/2018  . CKD (chronic kidney disease) stage 3, GFR 30-59 ml/min (HCC) 03/08/2018  . SIRS (systemic inflammatory response syndrome) ([place]) 03/08/2018  . Acute encephalopathy 03/08/2018  . Acute kidney injury ([place])   . Post-nasal drainage 01/19/2018  . Seasonal allergic rhinitis due to pollen 01/19/2018  . Bacteremia due to Enterococcus 07/08/2017  . Mucositis due to chemotherapy 07/08/2017  . Febrile neutropenia ([place]) 07/04/2017  . Drug-induced constipation 06/24/2017  . Thrush, oral 06/24/2017  . 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 De Quervain's tenosynovitis, left 12/29/2016  . Chronic combined systolic and diastolic CHF (congestive heart failure) ([place]) 07/17/2016  . Nonsustained ventricular tachycardia ([place]) 07/17/2016  . PAD (peripheral artery disease) ([place]) 07/17/2016  . Eczema 06/04/2016  . SI (sacroiliac) joint dysfunction 01/21/2016  . Hip pain, left 12/23/2015  . History of hypercoagulable state 12/23/2015  . Degenerative arthritis of right knee 10/28/2015  . Other specified personal risk factors, not elsewhere classified 08/27/2015  . ICD (implantable cardioverter-defibrillator) in place 08/27/2015  . Phantom limb pain ([place]) 05/24/2015  . Memory loss   . Status post below knee amputation of left lower extremity ([place])  03/08/2015  . S/P BKA (below knee amputation), left ([place]) 03/08/2015  . Acquired absence of left leg below knee ([place]) 03/08/2015  . Ischemic toe 03/05/2015  . Pain in joint, ankle and foot 02/28/2015  . Cold sensation of skin-Left foot 02/28/2015  . Other disturbances of skin sensation 02/28/2015  . Nonischemic cardiomyopathy ([place]) 02/22/2015  . Coronary artery disease involving native coronary artery of native heart without angina pectoris 02/22/2015  . Embolic disease of toe ([place]) 02/22/2015  . Ischemic ulcer of toe of left foot ([place]) 02/22/2015  . 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 Diabetic foot ulcer ([place]) 02/21/2015  . Critical lower limb ischemia 02/19/2015  . Controlled type 2 diabetes mellitus with stage 3 chronic kidney disease, with long-term current use of insulin ([place]) 09/19/2014  . Hereditary and idiopathic peripheral neuropathy 09/07/2014  . 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 Primary localized osteoarthrosis, lower leg 06/19/2014  . Low back pain 05/28/2014  . Acromioclavicular joint arthritis 05/28/2014  . Dental decay 05/25/2014  . Major depressive disorder, recurrent episode ([place]) 12/11/2013  . Long-term use of immunosuppressant medication 12/04/2013  . Obesity (BMI 30-39.9) 10/31/2013  . Chest pain 10/20/2013  . Stable angina ([place]) 10/19/2013  . Type 2 diabetes mellitus with hyperglycemia ([place]) 10/19/2013  . BPH (benign prostatic hyperplasia) 10/11/2013  . Enlarged prostate without lower urinary tract symptoms (luts) 10/11/2013  . Dyspnea 06/30/2013  . Depression 01/19/2013  . 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 History of depression 07/13/2012  . HTN (hypertension) 07/13/2012  . HLD (hyperlipidemia) 07/13/2012  . Personal history of other mental and behavioral disorders 07/13/2012  . Type 2 diabetes mellitus with diabetic polyneuropathy ([place]) 07/13/2012   PCP:  [name], MD Pharmacy:   [id number] [address], [place] [address] Phone: [phone] Fax: [phone]     Social Determinants of Health (SDOH) Interventions    Readmission Risk Interventions Readmission Risk Prevention Plan 02/13/2019  Transportation Screening Complete  Medication Review ([place]) Complete  PCP or Specialist appointment within 3-5 days of discharge Complete  HRI or [facility] Complete  SW Recovery Care/Counseling Consult Complete  Palliative Care Screening Complete  [place] Patient Refused  Some recent data might be hidden

## 2019-02-13 NOTE — Consult Note (Signed)
   [facility] CM Inpatient Consult   02/13/2019  Eric Price 11/02/1950 [id number]   Patient screened for extreme high risk score for unplanned readmissions and hospitalizations to check if potential Bath Management services are needed.  Chart was reviewed for readmission needs and see that the patient is being recommended for Hospice home and for transition to [facility].  Will sign off as the patient's community/post hospital needs will be met by Manufacturing engineer at [facility].  For questions contact:   Natividad Brood, RN BSN [facility] Liaison  [phone] business mobile phone Toll free office [phone]  Fax number: [phone] [name]  [email or web address]

## 2019-02-13 NOTE — Progress Notes (Signed)
PT Cancellation Note  Patient Details Name: Eric Price MRN: [id number] DOB: [birth year]   Cancelled Treatment:    Reason Eval/Treat Not Completed: (P) Medical issues which prohibited therapy(Pt is now on comfort care will d/c PT services at this time.  Will inform supervising PT of need to address goals and d/c order. )   Yusra Ravert Eli Hose 02/13/2019, 1:10 PM  Governor Rooks, PTA Acute Rehabilitation Services Pager [phone] Office [phone]

## 2019-02-13 NOTE — Progress Notes (Signed)
Palliative Medicine RN Note: Rec'd call from PMT NP Ihor Dow requesting help adding wife to visitation list. I notified both AC and front desk.  Marjie Skiff Jennetta Flood, RN, BSN, [facility] Palliative Medicine Team 02/13/2019 10:51 AM Office [phone]

## 2019-02-14 MED ORDER — HYDROMORPHONE HCL 1 MG/ML IJ SOLN
1.0000 mg | Freq: Four times a day (QID) | INTRAMUSCULAR | Status: DC
  Administered 2019-02-14: 1 mg via INTRAVENOUS
  Filled 2019-02-14: qty 1

## 2019-02-14 MED ORDER — HEPARIN SOD (PORK) LOCK FLUSH 100 UNIT/ML IV SOLN
250.0000 [IU] | INTRAVENOUS | Status: AC | PRN
  Administered 2019-02-14: 250 [IU]

## 2019-02-14 NOTE — Progress Notes (Signed)
Called [employer] to verify deactivation of ICD. Representative placed page for local provide Lauro Franklin). Awaiting call back.

## 2019-02-14 NOTE — Progress Notes (Signed)
Report given to Helene Kelp at [facility]. Discharge instruction on pt chart awaiting PTAR arrival. PIV removed and LUA PICC capped and flushed for transport by IV team. Unable to reach spouse to notify of time of transfer.

## 2019-02-14 NOTE — Progress Notes (Signed)
Nutrition Brief Note  Chart reviewed. Pt now transitioning to comfort care.  No further nutrition interventions warranted at this time.  Please re-consult as needed.   Charlee Squibb A. Riah Kehoe, RD, LDN, CDCES Registered Dietitian II Certified Diabetes Care and Education Specialist Pager: [phone] After hours Pager: [phone]  

## 2019-02-14 NOTE — Progress Notes (Signed)
[facility] room available for Eric Price today. Plan to meet spouse at 10:30 to complete registration visit. Dr. Orpah Melter to assume care per spouse preference. Spouse is aware she is able to visit at [facility]. Will update CSW [place] when paper work is complete.   Note ICD deativated.   RN please call report to [phone].  Please send discharge summary to [phone].  Thank you,  Erling Conte, LCSW [phone]

## 2019-02-14 NOTE — Progress Notes (Signed)
Patient discharged to [facility] place via [place].

## 2019-02-14 NOTE — TOC Transition Note (Signed)
Transition of Care [facility]) - CM/SW Discharge Note   Patient Details  Name: Hersel Mcmeen MRN: [id number] Date of Birth: May 22, 1951  [facility] CM/SW Contact:  Benard Halsted, LCSW Phone Number: 02/14/2019, 12:46 PM   Clinical Narrative:    Patient will DC to: [facility] Anticipated DC date: 02/14/19 Family notified: Spouse Transport by: Corey Harold 1:30pm   Per MD patient ready for DC to [facility]. RN, patient, patient's family, and facility notified of DC. Discharge Summary sent to facility. RN to call report prior to discharge [phone]). DC packet on chart. Ambulance transport requested for patient.   CSW will sign off for now as social work intervention is no longer needed. Please consult [place] again if new needs arise.  Cedric Fishman, LCSW Clinical Social Worker [phone]    Final next level of care: [place] Barriers to Discharge: No Barriers Identified   Patient Goals and CMS Choice Patient states their goals for this hospitalization and ongoing recovery are:: Comfort CMS Medicare.gov Compare Post Acute Care list provided to:: Other (Comment Required)(Spouse) Choice offered to / list presented to : Spouse  Discharge Placement                Patient to be transferred to facility by: [place] Name of family member notified: Spouse Patient and family notified of of transfer: 02/14/19  Discharge Plan and Services In-house Referral: Clinical Social Work, Hospice / Palliative Care Discharge Planning Services: NA Post Acute Care Choice: Hospice          DME Arranged: N/A DME Agency: NA       HH Arranged: NA HH Agency: NA        Social Determinants of Health (SDOH) Interventions     Readmission Risk Interventions Readmission Risk Prevention Plan 02/13/2019  Transportation Screening Complete  Medication Review Press photographer) Complete  PCP or Specialist appointment within 3-5 days of discharge Complete  HRI or [place]  Complete  SW Recovery Care/Counseling Consult Complete  [place] Patient Refused  Some recent data might be hidden

## 2019-02-14 NOTE — Plan of Care (Signed)
  Problem: Health Behavior/Discharge Planning: Goal: Ability to manage health-related needs will improve Outcome: Progressing   

## 2019-02-14 NOTE — Discharge Summary (Signed)
[facility] Discharge Summary   Patient: Eric Price ZOX:[id number]   PCP: [name], MD DOB: 1951/07/30   Date of admission: 02/08/2019   Date of discharge:  02/14/2019    Discharge Diagnoses:  Principal Problem:   Hypercalcemia Active Problems:   History of depression   HTN (hypertension)   HLD (hyperlipidemia)   Mycosis fungoides (HCC)   BPH (benign prostatic hyperplasia)   Obesity (BMI 30-39.9)   Major depressive disorder, recurrent episode ([place])   Controlled type 2 diabetes mellitus with stage 3 chronic kidney disease, with long-term current use of insulin (HCC)   Nonischemic cardiomyopathy (HCC)   S/P BKA (below knee amputation), left ([place])   ICD (implantable cardioverter-defibrillator) in place   Chronic combined systolic and diastolic CHF (congestive heart failure) (HCC)   CKD (chronic kidney disease) stage 3, GFR 30-59 ml/min (HCC)   Acute kidney injury ([place])   Bacteremia due to Enterococcus   Sepsis ([place])   Palliative care by specialist   Terminal care   Admitted From: HOME Disposition:  INPATIENT HOSPICE   Recommendations for Outpatient Follow-up:  1. PLEASE ESTABLISH CARE WITH HOSPICE, MED MANAGEMENT PER HOSPICE   Diet recommendation: COMFORT FEEDS  Activity: The patient is advised to gradually reintroduce usual activities.  Discharge Condition: stable  Code Status: DNR DNI HOSPICE  History of present illness: As per the H and P dictated on admission, "Eric Price is a 68 y.o. male with Past medical history of CAD s/p PCI, HTN, HLD, BPH, T2DM, gout, history of arterial thrombus s/p L BKA, HFrEF (last EF in 40-45% on 03/2018), mycosis fungoides s/p 6 cycles of EPO CH completed in 11/2017, 6 cycles of Mogamulizumab and 6 cycles of Bendamustine/Brentuximab, last on cycle 2 of Pembrolizumab, onChronic anticoagulation, sp Left BKA. The patient presented with complaints of fatigue and tiredness ongoing for last 2 days. History was taken from both  patient as well as patient's wife who is primary caregiver. Patient's mentation was better after discharge.  Since last 2 days patient has been more more sleepy.  This morning patient was not able to wake up to routine stimulation.  For last 2 days patient also had minimal oral intake.  No diarrhea no vomiting reported.  No fever no chills reported.  Home health nurse arrived to evaluate the patient but were not able to arouse the patient and therefore patient was brought to the hospital."  Hospital Course:  Summary of his active problems in the hospital is as following. Sepsis ([place]) ruled out History of enterococcus bacteremia So far no growth in blood cultures and urine cultures. Receiving IV fluid. Patient was already on IV daptomycin which she has been continuing without any missed.   We will currently stop cefepime now that the cultures are negative. Now comfort care  Severe hypercalcemia. Acute kidney injury on chronic kidney disease stage III. Acute metabolic encephalopathy Unresponsive episode at home. Comes in with confusion. Unremarkable CT head Renal function from baseline 1.6 to currently worsening Serum calcium also increased. Corrected serum calcium is 14. While the patient has sepsis also appears mildly volume overloaded. We will continue with IV fluids.   Treated with PRN IV Lasix with minimal improvement in serum calcium and actually worsening of serum creatinine. Patient was IV pamidronate and 1 dose of calcitonin without any improvement in serum calcium. Continues to report severe shortness of breath. Discussed with wife regarding prognosis and goals of care. She would choose comfort care as well as residential hospice. Discussed with  palliative care we will start the patient on Dilaudid and Ativan as needed. Also provide oral lasix Now comfort care  Mycosis fungoides ([place]) Diffuse papular rash due to the same Recent documentation from [place] where the patient  is getting his treatment suggest no further candidate for any chemotherapy. Palliative care consult for goals of care.   Now comfort care  History of depression Holding trazodone.  HTN (hypertension) Blood pressure marginally low. Currently holding blood pressure medication.  HLD (hyperlipidemia) Holding Lipitor for now.  BPH (benign prostatic hyperplasia) Monitor  Controlled type 2 diabetes mellitus with stage 3 chronic kidney disease, with long-term current use of insulin (HCC) Holding home insulin regimen  Now comfort care  Nonischemic cardiomyopathy ([place]) ICD (implantable cardioverter-defibrillator) in place Chronic combined systolic and diastolic CHF (congestive heart failure) (HCC) Volume overloaded. We will give low-dose Lasix. Now comfort care Device technician consulted for turning off ICD.  S/P BKA (below knee amputation), left ([place]) On the day of the discharge the patient's symptoms were controlled, and no other acute medical condition were reported by patient. the patient was felt safe to be discharge at hospice facility.  Consultants: Palliative care  Procedures: none  DISCHARGE MEDICATION: Allergies as of 02/14/2019      Reactions   Morphine Anaphylaxis, Shortness Of Breath   Morphine And Related Anaphylaxis, Shortness Of Breath   "took my breath away"   Oxycodone Anaphylaxis, Shortness Of Breath, Anxiety, Other (See Comments)   Pt stated, "It makes me climbs walls; fight wars"   Daptomycin Other (See Comments)   "too strong; has drained the life out of him"   Robaxin [methocarbamol] Other (See Comments)   "went out of his mind"      Medication List    STOP taking these medications   Accu-Chek Aviva device   acetaminophen 500 MG tablet Commonly known as:  TYLENOL   albuterol (2.5 MG/3ML) 0.083% nebulizer solution Commonly known as:  PROVENTIL   atorvastatin 20 MG tablet Commonly known as:  LIPITOR   B-D UF III MINI  PEN NEEDLES 31G X 5 MM Misc Generic drug:  Insulin Pen Needle   benzonatate 200 MG capsule Commonly known as:  TESSALON   carvedilol 25 MG tablet Commonly known as:  COREG   clobetasol cream 0.05 % Commonly known as:  TEMOVATE   daptomycin  IVPB Commonly known as:  CUBICIN   Dulaglutide 1.5 MG/0.5ML Sopn Commonly known as:  Trulicity   ENSURE COMPLETE PO   furosemide 80 MG tablet Commonly known as:  LASIX   glucagon 1 MG injection   glucose blood test strip Commonly known as:  ACCU-CHEK ACTIVE STRIPS   insulin aspart 100 UNIT/ML FlexPen Commonly known as:  NovoLOG FlexPen   Levemir FlexTouch 100 UNIT/ML Pen Generic drug:  Insulin Detemir   loratadine 10 MG tablet Commonly known as:  CLARITIN   Melatonin 3 MG Tabs   mineral oil-hydrophilic petrolatum ointment   nystatin 100000 UNIT/ML suspension Commonly known as:  MYCOSTATIN   ondansetron 8 MG tablet Commonly known as:  ZOFRAN   ONE TOUCH LANCETS Misc   pantoprazole 40 MG tablet Commonly known as:  PROTONIX   prednisoLONE acetate 1 % ophthalmic suspension Commonly known as:  PRED FORTE   prochlorperazine 10 MG tablet Commonly known as:  COMPAZINE   rivaroxaban 20 MG Tabs tablet Commonly known as:  XARELTO   sennosides-docusate sodium 8.6-50 MG tablet Commonly known as:  SENOKOT-S   traMADol 50 MG tablet Commonly known as:  ULTRAM   traZODone 50 MG tablet Commonly known as:  DESYREL      Allergies  Allergen Reactions   Morphine Anaphylaxis and Shortness Of Breath   Morphine And Related Anaphylaxis and Shortness Of Breath    "took my breath away"   Oxycodone Anaphylaxis, Shortness Of Breath, Anxiety and Other (See Comments)    Pt stated, "It makes me climbs walls; fight wars"   Daptomycin Other (See Comments)    "too strong; has drained the life out of him"   Robaxin [Methocarbamol] Other (See Comments)    "went out of his mind"   Discharge Instructions    Increase activity  slowly   Complete by:  As directed      Discharge Exam: Filed Weights   02/12/19 0438 02/13/19 0452 02/14/19 0526  Weight: 101.5 kg 101.9 kg 101.1 kg   Vitals:   02/13/19 0930 02/14/19 0526  BP: 123/70 131/67  Pulse:  (!) 122  Resp: (!) 22 (!) 22  Temp: 98.4 F (36.9 C) 98.2 F (36.8 C)  SpO2: 93% 93%   General: Appear in mild distress, diffuse pappular Rash; Oral Mucosa moist. Cardiovascular: S1 and S2 Present, no Murmur, no JVD Respiratory: Bilateral Air entry present and basal Crackles, bilateral  wheezes Abdomen: Bowel Sound present, Soft and mild tenderness Extremities: no Pedal edema, no calf tenderness Neurology: Grossly no focal neuro deficit.  The results of significant diagnostics from this hospitalization (including imaging, microbiology, ancillary and laboratory) are listed below for reference.    Significant Diagnostic Studies: Dg Chest 1 View  Result Date: 02/08/2019 CLINICAL DATA:  Shortness of breath EXAM: CHEST  1 VIEW COMPARISON:  01/23/2019 FINDINGS: Cardiac shadow is stable. Defibrillator is again noted and stable. Right-sided PICC line is seen in satisfactory position at the cavoatrial junction. The overall inspiratory effort is poor. Mild crowding of the vascular markings is noted related to the poor inspiratory effort. IMPRESSION: Poor inspiratory effort with crowding of the vascular markings. No acute abnormality noted. Electronically Signed   By: Inez Catalina M.D.   On: 02/08/2019 16:30   Ct Head Wo Contrast  Result Date: 02/08/2019 CLINICAL DATA:  Altered level of consciousness EXAM: CT HEAD WITHOUT CONTRAST TECHNIQUE: Contiguous axial images were obtained from the base of the skull through the vertex without intravenous contrast. COMPARISON:  03/08/2018 FINDINGS: Brain: Mild atrophic changes are noted. No findings to suggest acute hemorrhage, acute infarction or space-occupying mass lesion are noted. Vascular: No hyperdense vessel or unexpected  calcification. Skull: Normal. Negative for fracture or focal lesion. Sinuses/Orbits: No acute finding. Other: There is a 2 cm focal soft tissue lesion identified within the anterior aspect of the left parotid gland. This was not well visualized on the prior exam IMPRESSION: 1. Atrophic changes without acute abnormality. 2. 2 cm soft tissue lesion within the left parotid gland. 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 Differential of lymphoma or lymphadenopathy is less likely. MRI of the neck with and without contrast material is recommended for further follow-up. Electronically Signed   By: Inez Catalina M.D.   On: 02/08/2019 20:25   US Renal  Result Date: 02/09/2019 CLINICAL DATA:  Acute kidney injury.  Inpatient. EXAM: RENAL / URINARY TRACT ULTRASOUND COMPLETE COMPARISON:  08/24/2018 renal sonogram. FINDINGS: Right Kidney: Renal measurements: 9.5 x 4.3 x 4.0 cm = volume: 87 mL . Echogenicity within normal limits. No mass or hydronephrosis visualized. Lower pole  of the right kidney poorly visualized due to overlying bowel gas. Left Kidney: Renal measurements: 11.9 x 4.9 x 5.7 cm = volume: 174 mL. Normal renal parenchymal echogenicity and thickness. Mild-to-moderate left hydronephrosis, new. Exophytic hypoechoic 1.6 x 1.6 cm upper left renal cortical lesion, not previously described. Bladder: Bladder not significantly distended. Suggestion of diffuse bladder wall thickening. No focal bladder abnormality demonstrated. IMPRESSION: 1. Mild-to-moderate left hydronephrosis, new since 08/24/2018 renal sonogram. Acute left ureteral obstruction not excluded. 2. No right hydronephrosis. 3. Indeterminate exophytic hypoechoic 1.6 cm upper left renal cortical lesion, not previously visualized, left renal neoplasm not excluded. When clinically feasible, renal mass protocol MRI (preferred) or CT abdomen without and with IV  contrast recommended for further characterization. 4. Suggestion of nonspecific diffuse bladder wall thickening, although evaluation is limited by lack of bladder distention. Consider correlation with urinalysis. Electronically Signed   By: Ilona Sorrel M.D.   On: 02/09/2019 08:01   Dg Chest Port 1 View  Result Date: 02/09/2019 CLINICAL DATA:  SOB EXAM: PORTABLE CHEST 1 VIEW COMPARISON:  Radiograph 02/08/2019 FINDINGS: LEFT-sided pacemaker overlies normal cardiac silhouette. No effusion, infiltrate pneumothorax. RIGHT PICC line noted with tip in distal SVC. IMPRESSION: No acute cardiopulmonary process. Electronically Signed   By: Suzy Bouchard M.D.   On: 02/09/2019 17:28   Dg Chest Port 1 View  Result Date: 01/23/2019 CLINICAL DATA:  Fever EXAM: PORTABLE CHEST 1 VIEW COMPARISON:  11/23/2018 FINDINGS: Significant improvement in infiltrate on the left with mild residual airspace disease on the left. Right lung is clear. Negative for edema or effusion AICD in the right ventricle unchanged. Heart size within normal limits. IMPRESSION: Interval improvement in bilateral airspace disease. Mild residual left lower lobe airspace disease. Most likely clearing pneumonia. Electronically Signed   By: Franchot Gallo M.D.   On: 01/23/2019 19:25   Vas [place] Lower Extremity Venous (dvt)  Result Date: 02/09/2019  Lower Venous Study Indications: Edema.  Limitations: Body habitus. Performing Technologist: Abram Sander RVS  Examination Guidelines: A complete evaluation includes B-mode imaging, spectral Doppler, color Doppler, and power Doppler as needed of all accessible portions of each vessel. Bilateral testing is considered an integral part of a complete examination. Limited examinations for reoccurring indications may be performed as noted.  +---------+---------------+---------+-----------+----------+-------+  RIGHT     Compressibility Phasicity Spontaneity Properties Summary   +---------+---------------+---------+-----------+----------+-------+  CFV       Full            Yes       Yes                             +---------+---------------+---------+-----------+----------+-------+  SFJ       Full                                                      +---------+---------------+---------+-----------+----------+-------+  FV Prox   Full                                                      +---------+---------------+---------+-----------+----------+-------+  FV Mid    Full                                                      +---------+---------------+---------+-----------+----------+-------+  FV Distal Full                                                      +---------+---------------+---------+-----------+----------+-------+  PFV       Full                                                      +---------+---------------+---------+-----------+----------+-------+  POP       Full            Yes       Yes                             +---------+---------------+---------+-----------+----------+-------+  PTV       Full                                                      +---------+---------------+---------+-----------+----------+-------+  PERO      Full                                                      +---------+---------------+---------+-----------+----------+-------+   +----+---------------+---------+-----------+----------+-------+  LEFT Compressibility Phasicity Spontaneity Properties Summary  +----+---------------+---------+-----------+----------+-------+  CFV  Full            Yes       Yes                             +----+---------------+---------+-----------+----------+-------+     Summary: Right: There is no evidence of deep vein thrombosis in the lower extremity. No cystic structure found in the popliteal fossa. Left: No evidence of common femoral vein obstruction.  *See table(s) above for measurements and observations. Electronically signed by Servando Snare MD on 02/09/2019 at 4:29:18 PM.     Final    [place] Ekg Site Rite  Result Date: 01/26/2019 If Site Rite image not attached, placement could not be confirmed due to current cardiac rhythm.   Microbiology: Recent Results (from the past 240 hour(s))  Culture, blood (routine x 2)     Status: None   Collection Time: 02/08/19  4:17 PM  Result Value Ref Range Status   Specimen Description BLOOD LEFT ANTECUBITAL  Final   Special Requests   Final    BOTTLES DRAWN AEROBIC AND ANAEROBIC Blood Culture adequate volume   Culture   Final    NO GROWTH 5 DAYS Performed at [facility], [address] [place], [place] [address]    Report Status 02/13/2019 FINAL  Final  Culture, blood (routine x 2)     Status: None   Collection Time: 02/08/19  4:21 PM  Result Value Ref Range Status   Specimen Description BLOOD RIGHT WRIST  Final   Special Requests   Final    BOTTLES DRAWN AEROBIC AND ANAEROBIC  Blood Culture adequate volume   Culture   Final    NO GROWTH 5 DAYS Performed at [facility], [place] [address] [place], [place] [address]    Report Status 02/13/2019 FINAL  Final  SARS Coronavirus 2 (CEPHEID- Performed in [place] hospital lab), Hosp Order     Status: None   Collection Time: 02/08/19  4:28 PM  Result Value Ref Range Status   SARS Coronavirus 2 NEGATIVE NEGATIVE Final    Comment: (NOTE) If result is NEGATIVE SARS-CoV-2 target nucleic acids are NOT DETECTED. The SARS-CoV-2 RNA is generally detectable in upper and lower  respiratory specimens during the acute phase of infection. The lowest  concentration of SARS-CoV-2 viral copies this assay can detect is 250  copies / mL. A negative result does not preclude SARS-CoV-2 infection  and should not be used as the sole basis for treatment or other  patient management decisions.  A negative result may occur with  improper specimen collection / handling, submission of specimen other  than nasopharyngeal swab, presence of viral mutation(s) within the  areas  targeted by this assay, and inadequate number of viral copies  (<250 copies / mL). A negative result must be combined with clinical  observations, patient history, and epidemiological information. If result is POSITIVE SARS-CoV-2 target nucleic acids are DETECTED. The SARS-CoV-2 RNA is generally detectable in upper and lower  respiratory specimens dur ing the acute phase of infection.  Positive  results are indicative of active infection with SARS-CoV-2.  Clinical  correlation with patient history and other diagnostic information is  necessary to determine patient infection status.  Positive results do  not rule out bacterial infection or co-infection with other viruses. If result is PRESUMPTIVE POSTIVE SARS-CoV-2 nucleic acids MAY BE PRESENT.   A presumptive positive result was obtained on the submitted specimen  and confirmed on repeat testing.  While 2019 novel coronavirus  (SARS-CoV-2) nucleic acids may be present in the submitted sample  additional confirmatory testing may be necessary for epidemiological  and / or clinical management purposes  to differentiate between  SARS-CoV-2 and other Sarbecovirus currently known to infect humans.  If clinically indicated additional testing with an alternate test  methodology [phone]) is advised. The SARS-CoV-2 RNA is generally  detectable in upper and lower respiratory sp ecimens during the acute  phase of infection. The expected result is Negative. Fact Sheet for Patients:  StrictlyIdeas.no Fact Sheet for Healthcare Providers: BankingDealers.co.za This test is not yet approved or cleared by the [place] FDA and has been authorized for detection and/or diagnosis of SARS-CoV-2 by FDA under an Emergency Use Authorization (EUA).  This EUA will remain in effect (meaning this test can be used) for the duration of the COVID-19 declaration under Section 564(b)(1) of the Act, 21 U.S.C. section  360bbb-3(b)(1), unless the authorization is terminated or revoked sooner. Performed at [facility], [place] [address] [place], [place] [address]   Urine culture     Status: None   Collection Time: 02/08/19  4:58 PM  Result Value Ref Range Status   Specimen Description URINE, RANDOM  Final   Special Requests NONE  Final   Culture   Final    NO GROWTH Performed at [facility], [place] [address] [place], [place] [address]    Report Status 02/09/2019 FINAL  Final     Labs: CBC: Recent Labs  Lab 02/08/19 1615 02/09/19 0333 02/10/19 0500 02/11/19 0345  WBC 8.6 7.7 9.2  10.1  NEUTROABS 1.3*  --   --  1.2*  HGB 9.6* 8.6* 9.1* 9.1*  HCT 28.8* 26.7* 28.2* 28.0*  MCV 85.5 84.5 86.0 86.2  PLT 280 232 220 342   Basic Metabolic Panel: Recent Labs  Lab 02/08/19 2302 02/09/19 0333 02/09/19 2055 02/10/19 0946 02/11/19 0345 02/12/19 1310  NA 135 136 135 137 137 134*  K 4.2 3.7 4.0 3.9 3.9 3.9  CL 97* 99 95* 97* 100 94*  CO2 25 24 26 25 26 25   GLUCOSE 101* 80 119* 131* 106* 95  BUN 33* 32* 34* 31* 29* 24*  CREATININE 2.75* 2.57* 2.94* 2.77* 2.57* 2.32*  CALCIUM 13.0* 11.7* 13.1* 12.8* 12.6* 12.8*  MG 1.8  --   --   --  1.8  --    Liver Function Tests: Recent Labs  Lab 02/08/19 1615 02/08/19 2302 02/09/19 0333 02/10/19 0946 02/11/19 0345  AST 32 31 30 43* 46*  ALT 19 18 16 20 27   ALKPHOS 102 95 89 92 94  BILITOT 0.5 0.7 0.4 0.4 0.6  PROT 6.2* 6.0* 5.4* 5.7* 5.8*  ALBUMIN 3.3* 3.1* 2.9* 2.9* 3.0*   Recent Labs  Lab 02/10/19 0946  LIPASE 22   No results for input(s): AMMONIA in the last 168 hours. Cardiac Enzymes: Recent Labs  Lab 02/08/19 2302  CKTOTAL 268   BNP (last 3 results) Recent Labs    11/23/18 1318 01/23/19 2141 02/08/19 1720  BNP 24.4 45.0 33.5   CBG: Recent Labs  Lab 02/10/19 2204 02/11/19 0801 02/11/19 1153 02/11/19 1639 02/11/19 2111  GLUCAP 130* 103* 117* 126* 147*   Time spent: 35 minutes  Signed:  Berle Mull  [facility]  02/14/2019

## 2019-02-14 NOTE — Progress Notes (Signed)
Received call from Eric Price from [facility] stating ICD device has been deactivated.

## 2019-02-20 ENCOUNTER — Encounter: Payer: Medicare HMO | Admitting: *Deleted

## 2019-02-20 ENCOUNTER — Other Ambulatory Visit: Payer: Self-pay

## 2019-02-20 ENCOUNTER — Other Ambulatory Visit: Payer: Self-pay | Admitting: Family Medicine

## 2019-02-21 ENCOUNTER — Telehealth: Payer: Self-pay

## 2019-02-21 ENCOUNTER — Telehealth: Payer: Self-pay | Admitting: *Deleted

## 2019-02-21 NOTE — Telephone Encounter (Signed)
Spoke with patient to remind of missed remote transmission 

## 2019-02-21 NOTE — Telephone Encounter (Signed)
I spoke with wife and shared our condolences.  She is doing well, given the circumstances.

## 2019-02-21 NOTE — Telephone Encounter (Signed)
Please see message. So sad...Marland KitchenMarland Kitchen

## 2019-02-21 NOTE — Telephone Encounter (Signed)
Copied from [place] [phone]. Topic: General - Other >> Feb 21, 2019 11:39 AM Leward Quan A wrote: Reason for CRM: Eric Price Patient wife called to inform Dr Elease Hashimoto that he passed away on 2019-03-09. Any questions call Ph# [phone]

## 2019-02-23 ENCOUNTER — Telehealth: Payer: Medicare HMO | Admitting: Physician Assistant

## 2019-02-28 ENCOUNTER — Encounter: Payer: Self-pay | Admitting: Cardiology

## 2019-02-28 DIAGNOSIS — Z89512 Acquired absence of left leg below knee: Secondary | ICD-10-CM | POA: Diagnosis not present

## 2019-03-01 ENCOUNTER — Inpatient Hospital Stay: Payer: Medicare HMO | Admitting: Internal Medicine

## 2019-03-06 NOTE — Telephone Encounter (Signed)
Discontinue if d/c said so.

## 2019-03-06 NOTE — Telephone Encounter (Signed)
OK to continue this medication? I tried to look up the medication list but it is currently blank in his chart?? Note said stop taking at discharge from recent hospital visit?

## 2019-03-06 DEATH — deceased

## 2019-04-27 ENCOUNTER — Other Ambulatory Visit: Payer: Self-pay

## 2019-12-03 IMAGING — RF DG SWALLOWING FUNCTION
1 series · 17 of 24 positions shown · non-contrast
Comparison: None.

CLINICAL DATA: Choking spells.

EXAM:
MODIFIED BARIUM SWALLOW
TECHNIQUE: Different consistencies of barium were administered orally to the
patient by the Speech Pathologist. Imaging of the pharynx was
performed in the lateral projection.
FLUOROSCOPY TIME:  Fluoroscopy Time:  1 minute and 8 seconds
Radiation Exposure Index (if provided by the fluoroscopic device):
Number of Acquired Spot Images: 0

[Series 1: run · 15 acquisitions, 17 frames shown]
[im 1/15]
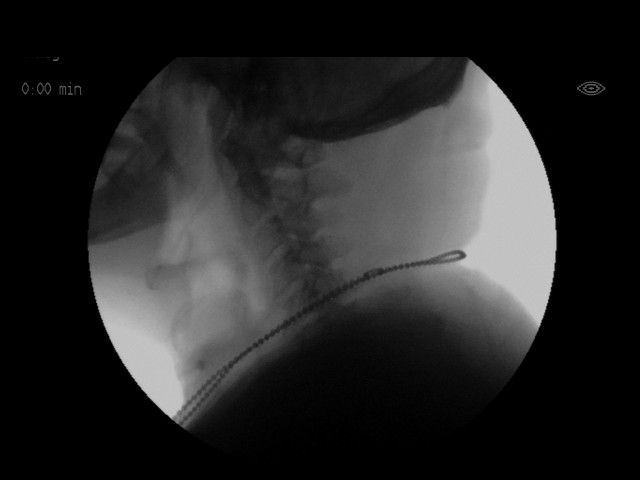
[im 2/15]
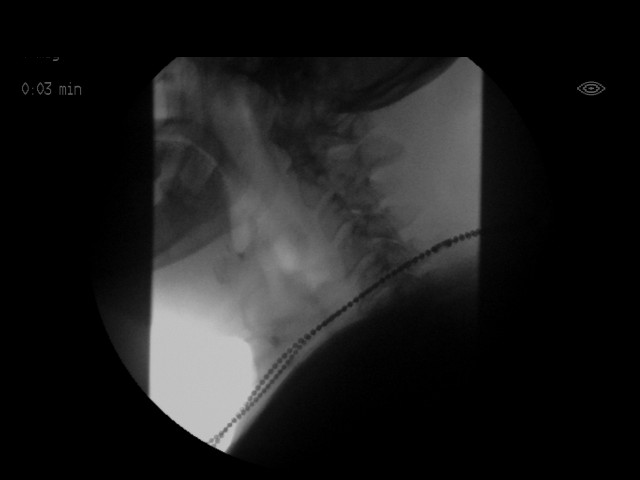
[im 3/15]
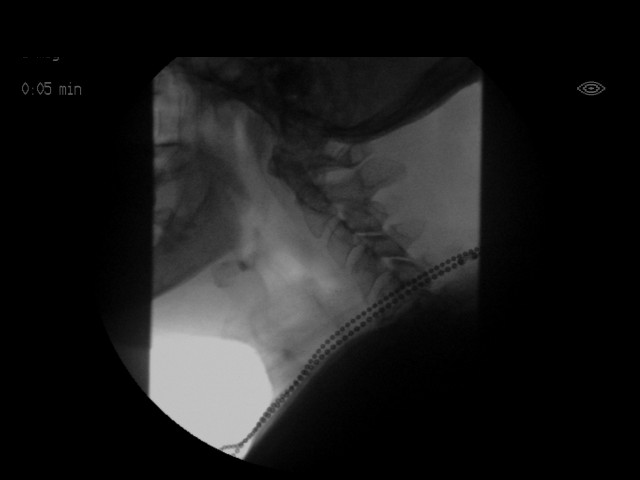
[im 3/15]
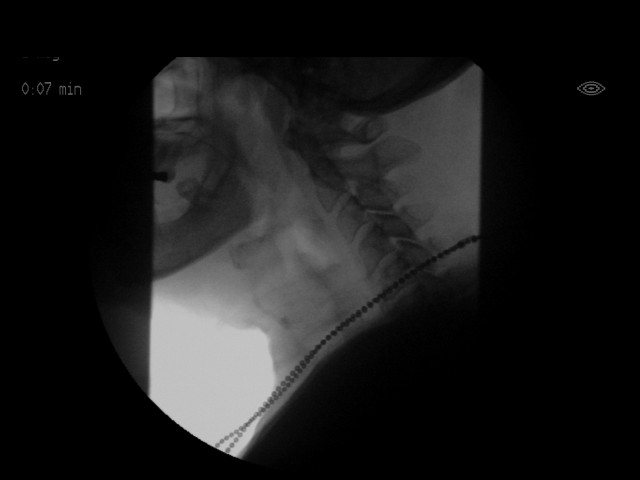
[im 4/15]
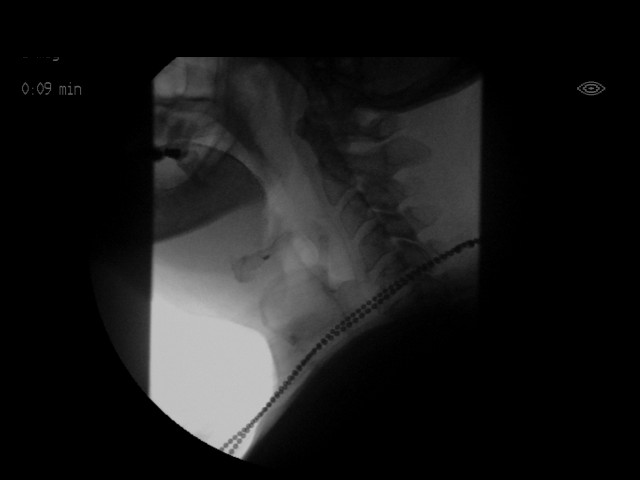
[im 5/15]
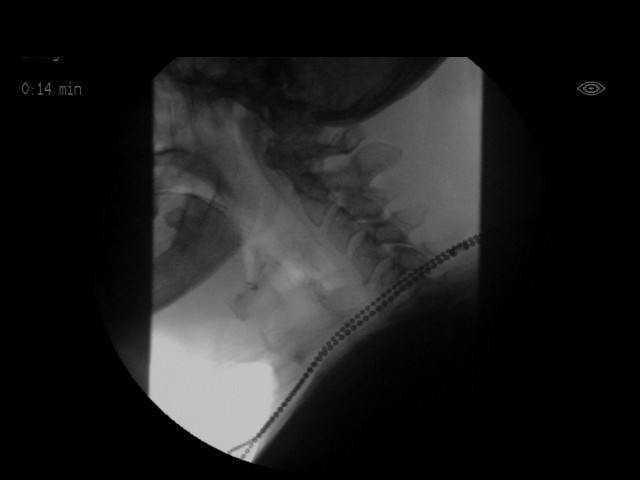
[im 6/15]
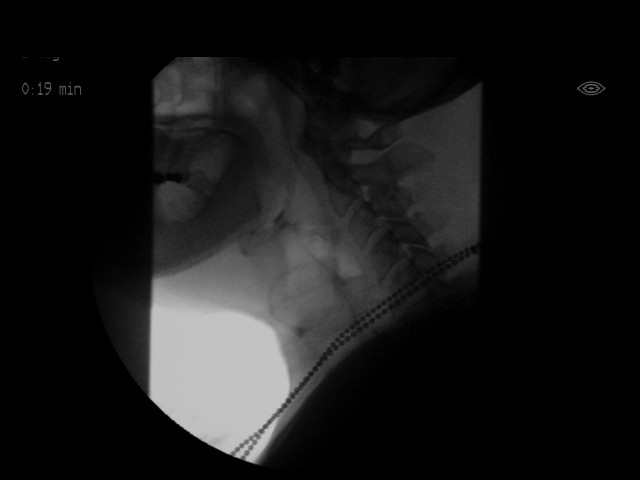
[im 7/15]
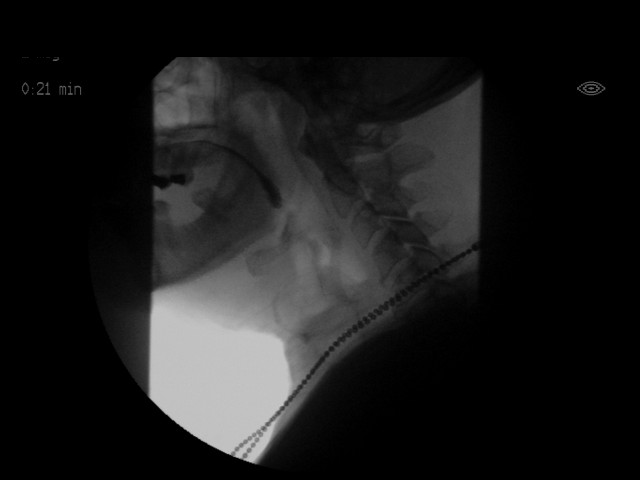
[im 8/15]
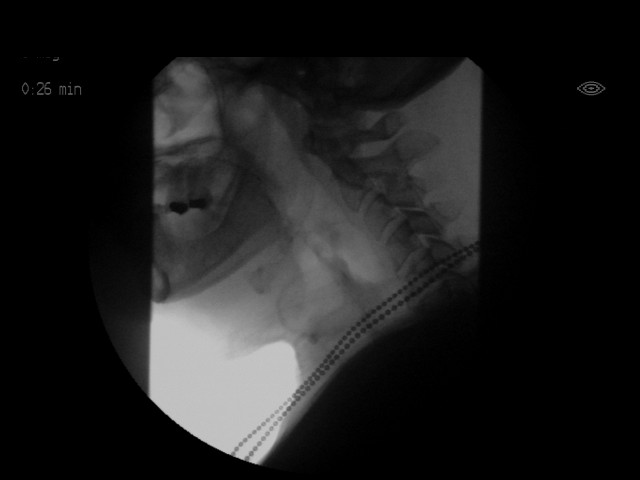
[im 9/15]
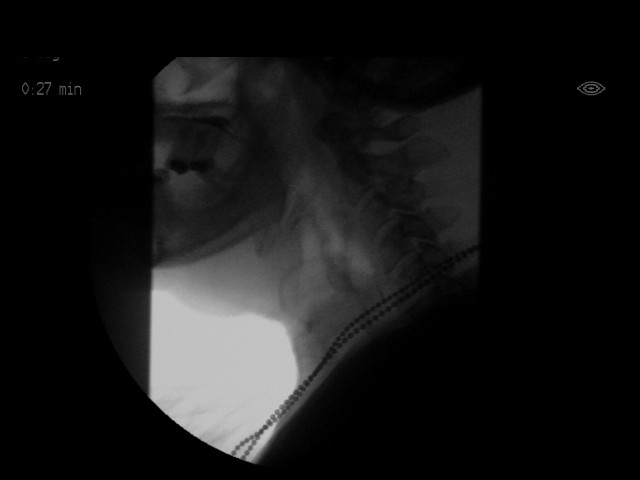
[im 10/15]
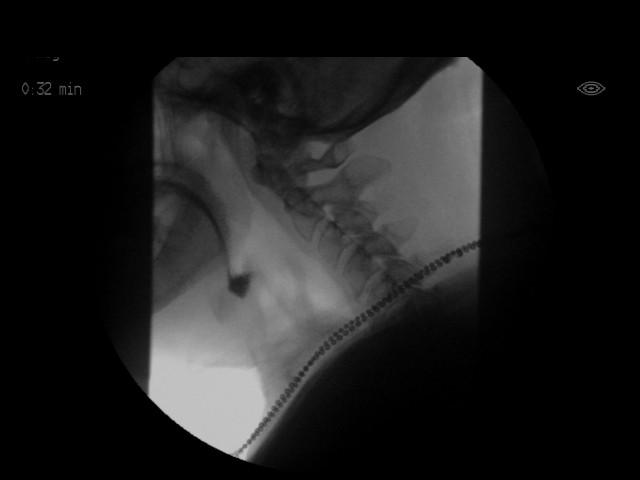
[im 11/15]
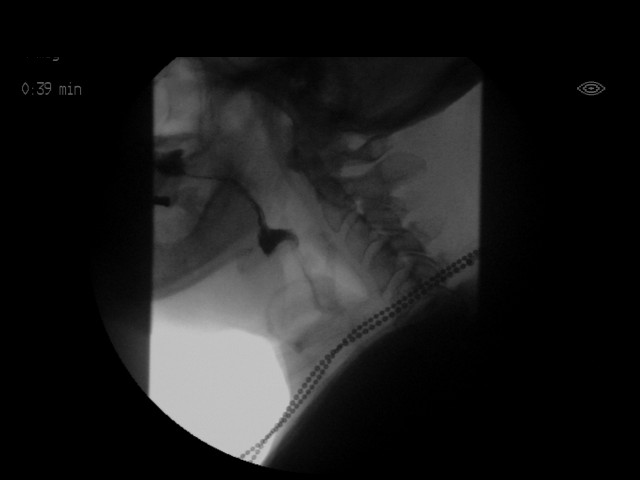
[im 12/15]
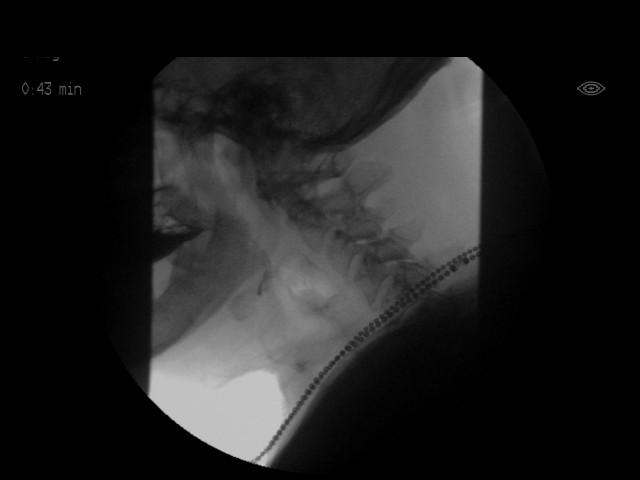
[im 13/15]
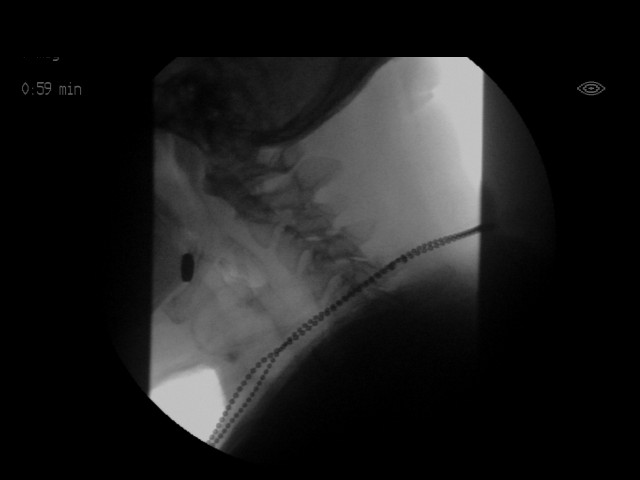
[im 13/15]
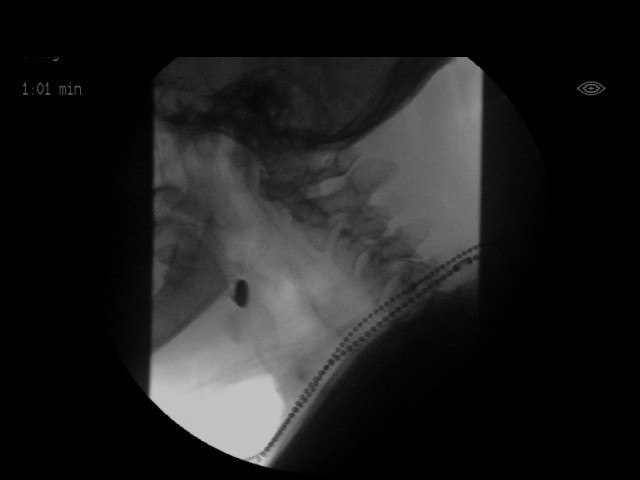
[im 14/15]
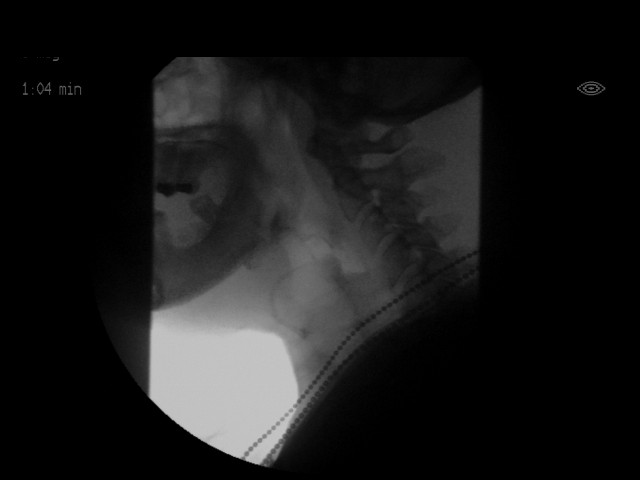
[im 15/15]
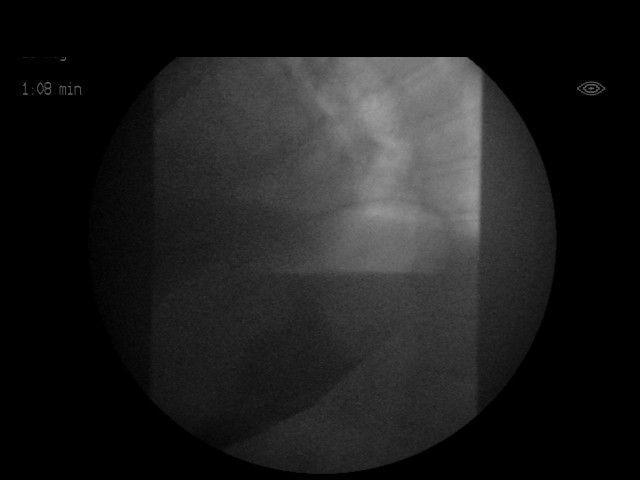

[17 of 24 positions shown; findings below may reference images not displayed]

FINDINGS: Thin liquid- within normal limits

Nectar thick liquid-not performed

Honey-not performed

Masoudi?Nurien within normal limits

Cracker-not performed

Masoudi?Naenae with cracker- within normal limits

Barium tablet-the barium pill lodged in the vallecula when swallowed
with the thin liquid. This cleared when swallowed with the
applesauce.

Other: When swallowing the peaches there was early spillover noted.
IMPRESSION: Normal pharyngeal motion with swallowing. No laryngeal penetration
or aspiration.

Please refer to the Speech Pathologists report for complete details
and recommendations.

## 2020-07-23 IMAGING — DX DG CHEST 2V
2 series · 2 of 2 positions shown · non-contrast
Comparison: 03/08/2018

CLINICAL DATA: Not feeling well dizzy

EXAM:
CHEST - 2 VIEW

[w chest lat]
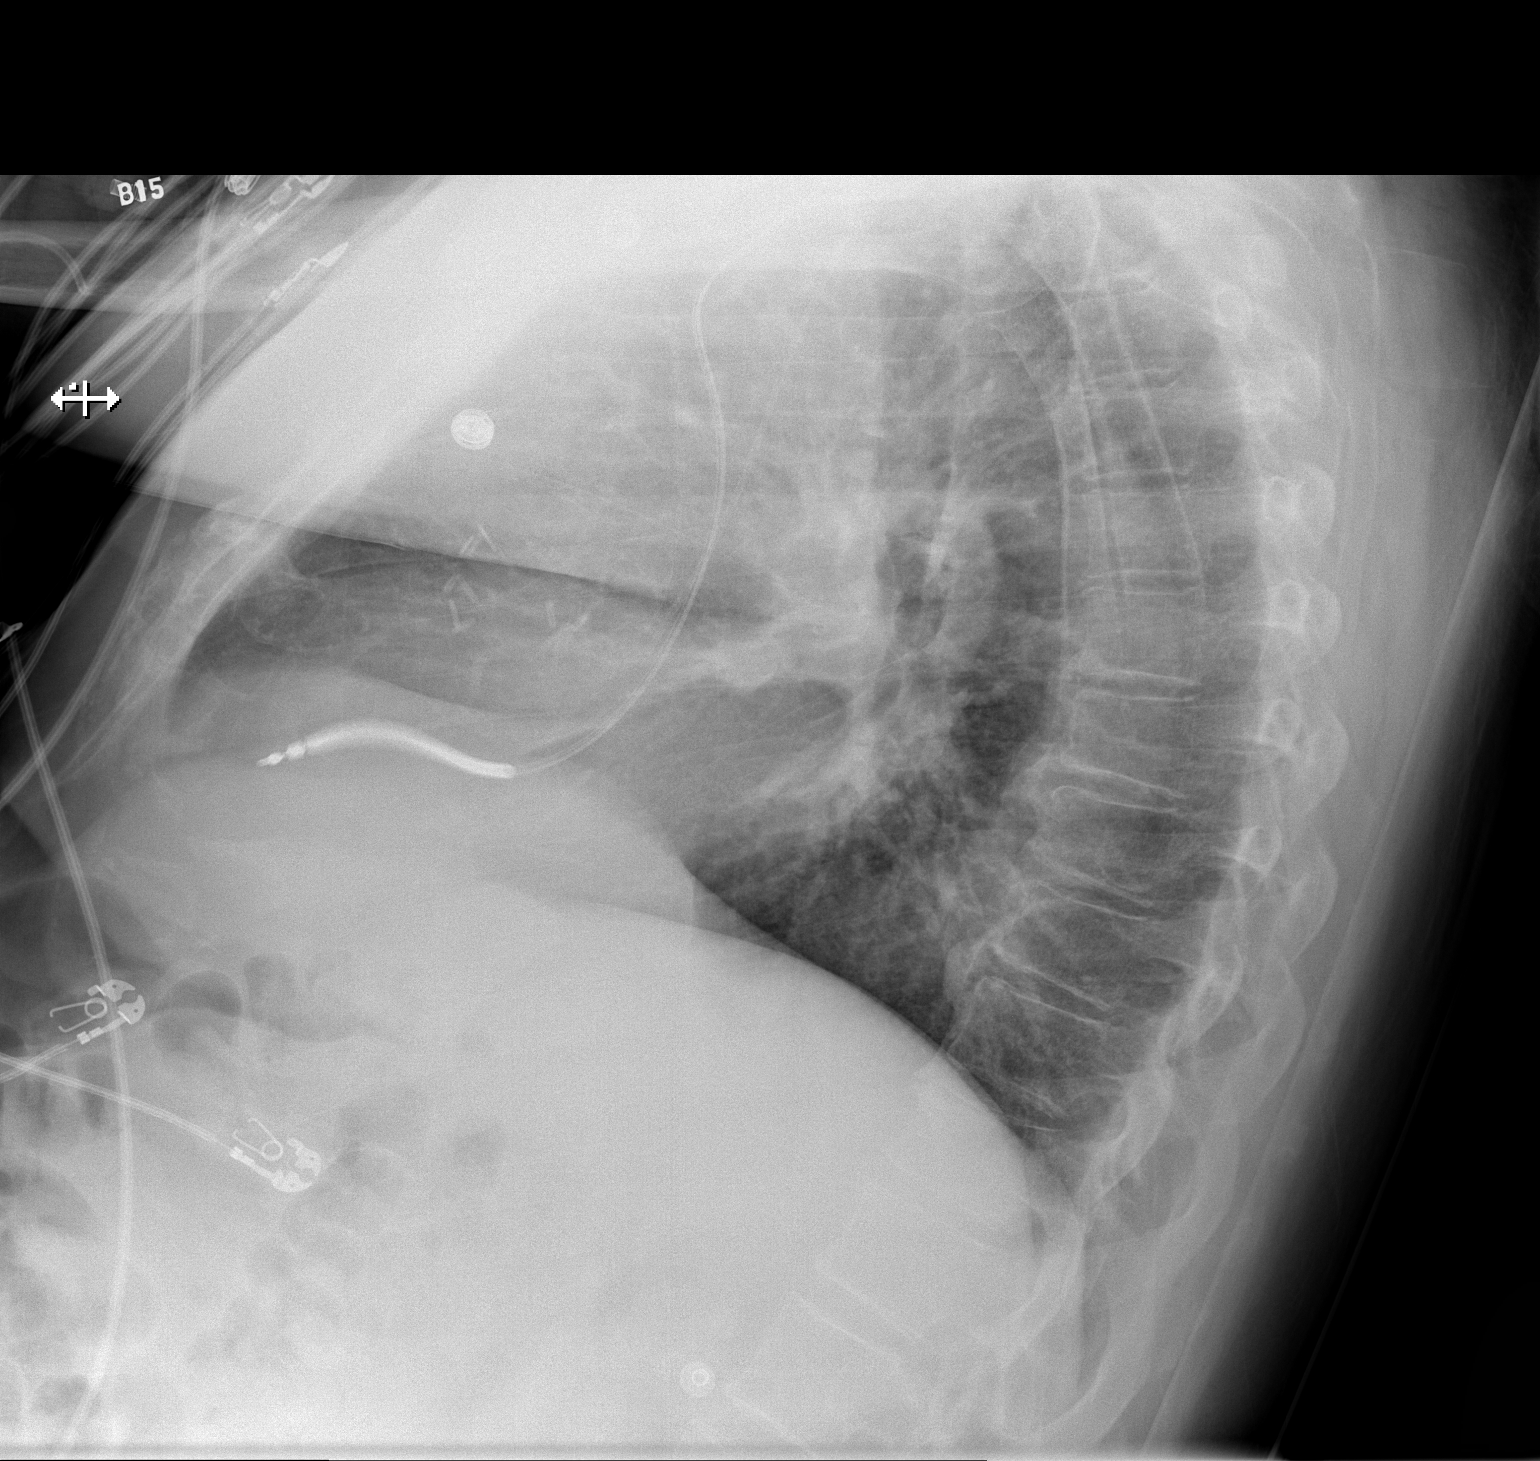

[x chest ap]
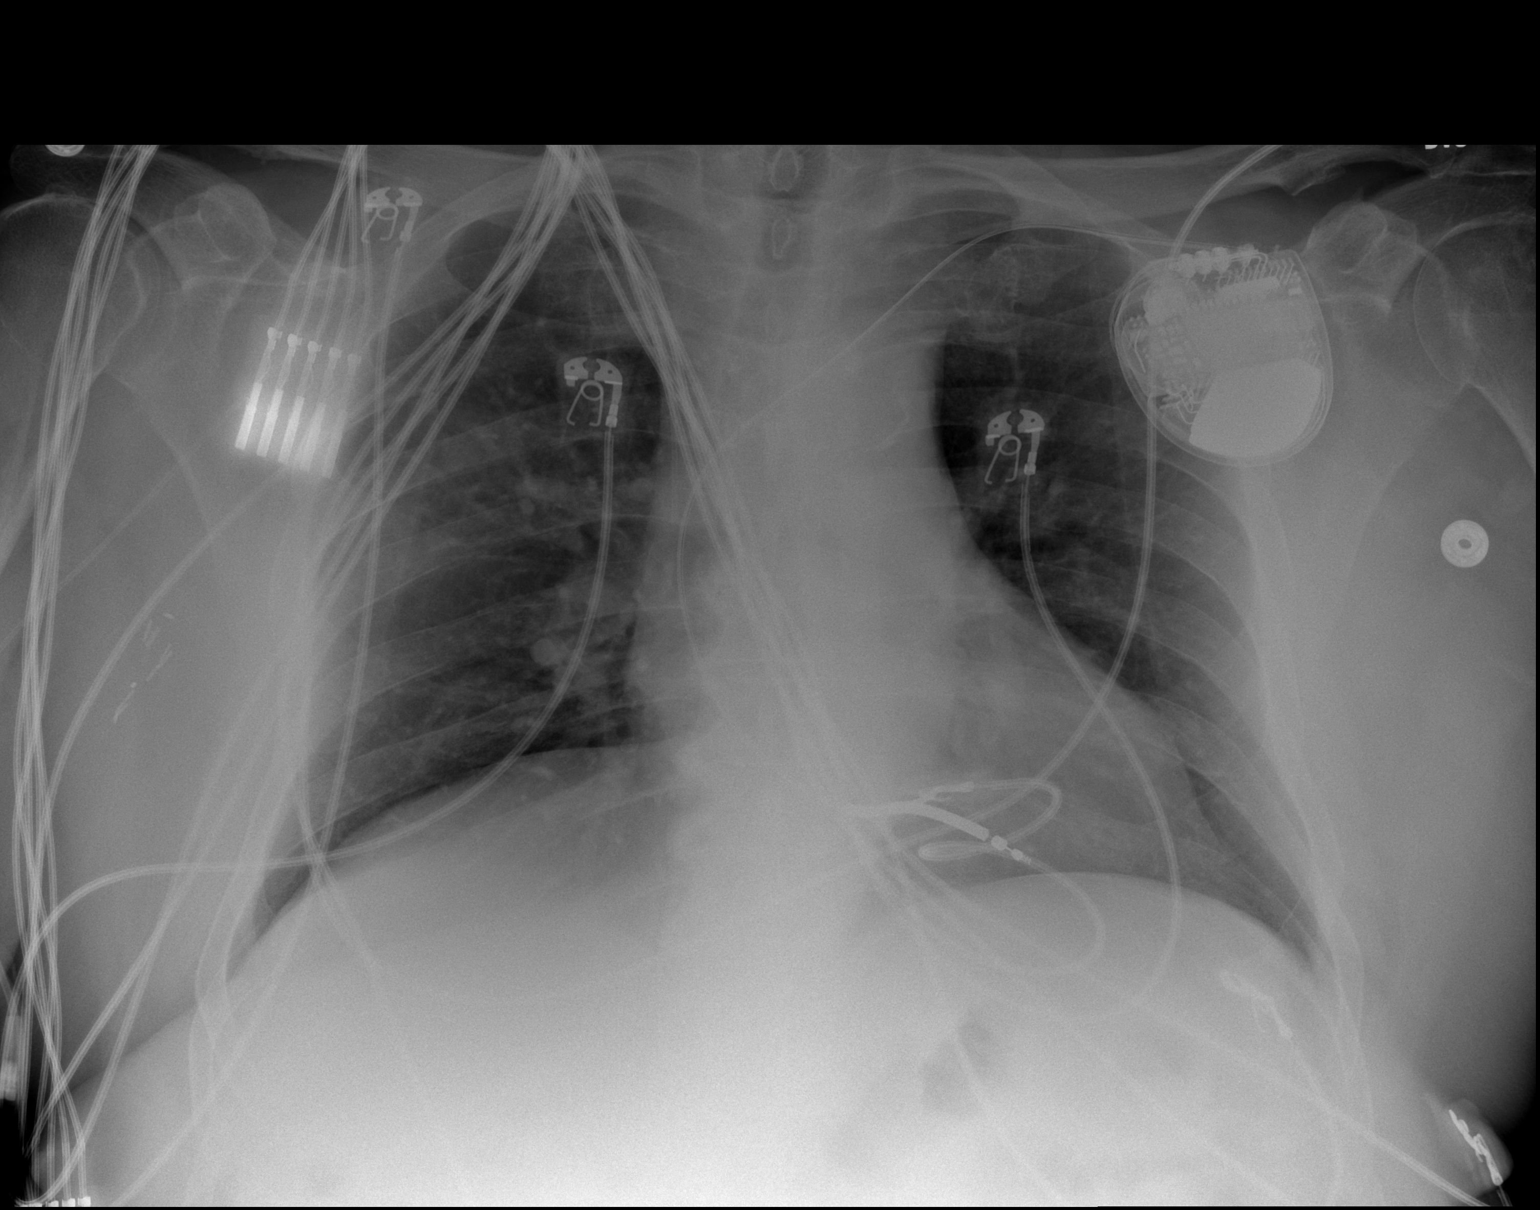

[2 of 2 positions shown; findings below may reference images not displayed]

FINDINGS: Left-sided pacing device as before. No focal opacity or pleural
effusion. Stable cardiomediastinal silhouette with aortic
atherosclerosis. No pneumothorax. Clips in the right axilla.
IMPRESSION: No active cardiopulmonary disease.
